# Patient Record
Sex: Male | Born: 1965
Health system: Southern US, Community
[De-identification: ages and names within clinical notes are randomized; demographics above are authoritative.]

## PROBLEM LIST (undated history)

## (undated) DIAGNOSIS — N400 Enlarged prostate without lower urinary tract symptoms: Secondary | ICD-10-CM

## (undated) DIAGNOSIS — N189 Chronic kidney disease, unspecified: Secondary | ICD-10-CM

## (undated) DIAGNOSIS — Z87448 Personal history of other diseases of urinary system: Secondary | ICD-10-CM

## (undated) DIAGNOSIS — Z940 Kidney transplant status: Secondary | ICD-10-CM

## (undated) DIAGNOSIS — I872 Venous insufficiency (chronic) (peripheral): Secondary | ICD-10-CM

## (undated) DIAGNOSIS — I429 Cardiomyopathy, unspecified: Secondary | ICD-10-CM

## (undated) DIAGNOSIS — I272 Pulmonary hypertension, unspecified: Secondary | ICD-10-CM

## (undated) DIAGNOSIS — R600 Localized edema: Secondary | ICD-10-CM

## (undated) DIAGNOSIS — M25561 Pain in right knee: Secondary | ICD-10-CM

## (undated) DIAGNOSIS — R55 Syncope and collapse: Secondary | ICD-10-CM

## (undated) DIAGNOSIS — I340 Nonrheumatic mitral (valve) insufficiency: Secondary | ICD-10-CM

## (undated) DIAGNOSIS — J189 Pneumonia, unspecified organism: Secondary | ICD-10-CM

## (undated) DIAGNOSIS — I10 Essential (primary) hypertension: Secondary | ICD-10-CM

## (undated) DIAGNOSIS — Z87442 Personal history of urinary calculi: Secondary | ICD-10-CM

## (undated) DIAGNOSIS — Z8669 Personal history of other diseases of the nervous system and sense organs: Secondary | ICD-10-CM

## (undated) DIAGNOSIS — N186 End stage renal disease: Secondary | ICD-10-CM

## (undated) DIAGNOSIS — G4730 Sleep apnea, unspecified: Secondary | ICD-10-CM

## (undated) DIAGNOSIS — I509 Heart failure, unspecified: Secondary | ICD-10-CM

## (undated) DIAGNOSIS — J45909 Unspecified asthma, uncomplicated: Secondary | ICD-10-CM

## (undated) DIAGNOSIS — M1A9XX0 Chronic gout, unspecified, without tophus (tophi): Secondary | ICD-10-CM

## (undated) DIAGNOSIS — Z9889 Other specified postprocedural states: Secondary | ICD-10-CM

## (undated) DIAGNOSIS — T7840XA Allergy, unspecified, initial encounter: Secondary | ICD-10-CM

## (undated) DIAGNOSIS — R0600 Dyspnea, unspecified: Secondary | ICD-10-CM

## (undated) DIAGNOSIS — M1990 Unspecified osteoarthritis, unspecified site: Secondary | ICD-10-CM

## (undated) DIAGNOSIS — R609 Edema, unspecified: Secondary | ICD-10-CM

## (undated) DIAGNOSIS — M25562 Pain in left knee: Secondary | ICD-10-CM

## (undated) DIAGNOSIS — M25522 Pain in left elbow: Secondary | ICD-10-CM

## (undated) DIAGNOSIS — N401 Enlarged prostate with lower urinary tract symptoms: Secondary | ICD-10-CM

## (undated) DIAGNOSIS — I428 Other cardiomyopathies: Secondary | ICD-10-CM

## (undated) DIAGNOSIS — M79605 Pain in left leg: Secondary | ICD-10-CM

## (undated) DIAGNOSIS — D638 Anemia in other chronic diseases classified elsewhere: Secondary | ICD-10-CM

## (undated) DIAGNOSIS — D631 Anemia in chronic kidney disease: Secondary | ICD-10-CM

## (undated) DIAGNOSIS — R011 Cardiac murmur, unspecified: Secondary | ICD-10-CM

## (undated) DIAGNOSIS — M25572 Pain in left ankle and joints of left foot: Secondary | ICD-10-CM

## (undated) DIAGNOSIS — F909 Attention-deficit hyperactivity disorder, unspecified type: Secondary | ICD-10-CM

## (undated) DIAGNOSIS — N210 Calculus in bladder: Secondary | ICD-10-CM

## (undated) DIAGNOSIS — Z8673 Personal history of transient ischemic attack (TIA), and cerebral infarction without residual deficits: Secondary | ICD-10-CM

## (undated) DIAGNOSIS — J302 Other seasonal allergic rhinitis: Secondary | ICD-10-CM

## (undated) DIAGNOSIS — M79604 Pain in right leg: Secondary | ICD-10-CM

## (undated) DIAGNOSIS — I5022 Chronic systolic (congestive) heart failure: Secondary | ICD-10-CM

## (undated) HISTORY — DX: Chronic kidney disease, unspecified: N18.9

## (undated) HISTORY — DX: Pain in left ankle and joints of left foot: M25.572

## (undated) HISTORY — DX: Pulmonary hypertension, unspecified: I27.20

## (undated) HISTORY — DX: Chronic systolic (congestive) heart failure: I50.22

## (undated) HISTORY — DX: Allergy, unspecified, initial encounter: T78.40XA

## (undated) HISTORY — DX: Edema, unspecified: R60.9

## (undated) HISTORY — PX: PERITONEAL CATHETER INSERTION: SHX2223

## (undated) HISTORY — DX: Venous insufficiency (chronic) (peripheral): I87.2

## (undated) HISTORY — PX: PERITONEAL CATHETER REMOVAL: SUR1106

## (undated) HISTORY — PX: HERNIA REPAIR: SHX51

## (undated) HISTORY — DX: Essential (primary) hypertension: I10

## (undated) HISTORY — DX: Pain in right leg: M79.604

## (undated) HISTORY — DX: Nonrheumatic mitral (valve) insufficiency: I34.0

## (undated) HISTORY — DX: Pain in left leg: M79.605

## (undated) HISTORY — DX: Anemia in other chronic diseases classified elsewhere: D63.8

## (undated) HISTORY — DX: Unspecified osteoarthritis, unspecified site: M19.90

## (undated) HISTORY — DX: Localized edema: R60.0

## (undated) HISTORY — DX: Cardiomyopathy, unspecified: I42.9

## (undated) HISTORY — PX: UMBILICAL HERNIA REPAIR: SHX196

## (undated) HISTORY — DX: Unspecified asthma, uncomplicated: J45.909

## (undated) HISTORY — DX: Pain in right knee: M25.561

## (undated) HISTORY — PX: LAPAROSCOPIC INGUINAL HERNIA REPAIR: SUR788

## (undated) HISTORY — DX: Pain in left knee: M25.562

## (undated) HISTORY — DX: Pain in left elbow: M25.522

---

## 2008-05-02 HISTORY — PX: MENISCUS REPAIR: SHX5179

## 2008-05-02 HISTORY — PX: KNEE ARTHROSCOPY W/ MENISCAL REPAIR: SHX1877

## 2010-06-07 ENCOUNTER — Other Ambulatory Visit: Payer: Self-pay | Admitting: Family Medicine

## 2010-06-07 DIAGNOSIS — M25561 Pain in right knee: Secondary | ICD-10-CM

## 2010-06-08 ENCOUNTER — Ambulatory Visit
Admission: RE | Admit: 2010-06-08 | Discharge: 2010-06-08 | Disposition: A | Discharge status: Home / Self Care | Payer: 59 | Source: Ambulatory Visit | Attending: Family Medicine | Admitting: Family Medicine

## 2010-06-08 DIAGNOSIS — M25561 Pain in right knee: Secondary | ICD-10-CM

## 2010-07-17 ENCOUNTER — Emergency Department (HOSPITAL_COMMUNITY)
Admission: EM | Admit: 2010-07-17 | Discharge: 2010-07-17 | Disposition: A | Discharge status: Home / Self Care | Payer: 59 | Attending: Emergency Medicine | Admitting: Emergency Medicine

## 2010-07-17 DIAGNOSIS — K089 Disorder of teeth and supporting structures, unspecified: Secondary | ICD-10-CM | POA: Insufficient documentation

## 2010-07-17 DIAGNOSIS — I10 Essential (primary) hypertension: Secondary | ICD-10-CM | POA: Insufficient documentation

## 2010-07-17 DIAGNOSIS — J45909 Unspecified asthma, uncomplicated: Secondary | ICD-10-CM | POA: Insufficient documentation

## 2010-07-17 DIAGNOSIS — K029 Dental caries, unspecified: Secondary | ICD-10-CM | POA: Insufficient documentation

## 2010-11-04 DIAGNOSIS — M25572 Pain in left ankle and joints of left foot: Secondary | ICD-10-CM

## 2010-11-04 HISTORY — DX: Pain in left ankle and joints of left foot: M25.572

## 2011-09-13 ENCOUNTER — Other Ambulatory Visit: Payer: Self-pay

## 2011-09-13 DIAGNOSIS — R609 Edema, unspecified: Secondary | ICD-10-CM

## 2011-10-02 ENCOUNTER — Encounter: Payer: Self-pay | Admitting: Surgery

## 2011-10-06 ENCOUNTER — Encounter: Payer: Self-pay | Admitting: Surgery

## 2011-10-09 ENCOUNTER — Encounter: Payer: 59 | Admitting: Surgery

## 2011-11-03 ENCOUNTER — Encounter: Payer: Self-pay | Admitting: Surgery

## 2011-11-06 ENCOUNTER — Encounter: Payer: 59 | Admitting: Surgery

## 2011-11-14 ENCOUNTER — Encounter: Payer: Self-pay | Admitting: Vascular Surgery

## 2011-11-15 ENCOUNTER — Encounter: Payer: Self-pay | Admitting: Vascular Surgery

## 2011-11-15 ENCOUNTER — Encounter (INDEPENDENT_AMBULATORY_CARE_PROVIDER_SITE_OTHER): Payer: 59 | Admitting: *Deleted

## 2011-11-15 ENCOUNTER — Ambulatory Visit (INDEPENDENT_AMBULATORY_CARE_PROVIDER_SITE_OTHER): Payer: 59 | Admitting: Vascular Surgery

## 2011-11-15 VITALS — BP 155/102 | HR 68 | Resp 16 | Ht 69.0 in | Wt 357.0 lb

## 2011-11-15 DIAGNOSIS — R609 Edema, unspecified: Secondary | ICD-10-CM

## 2011-11-15 NOTE — Progress Notes (Signed)
[facility] [place]  Patient name: Vernon Blackburn MRN: [id number] DOB: Oct 24, 1965 Sex: male  REASON FOR CONSULT: bilateral lower extremity swelling. Referred by Dr. Eunice Blase  HPI: Vernon Blackburn is a 46 y.o. male states that he noted the gradual onset of bilateral lower extremity swelling approximately 8 months ago. His swelling is always been worse on the right side. He denies any previous history of DVT or phlebitis. He has not had surgery on his abdomen her groins nor has he had radiation therapy. He had been taking Axiron 4 testosterone replacement but states that this made his leg swelling worse and therefore he stopped taking this. His symptoms are aggravated by standing and relieved with rest. He does experience some aching pain in his legs with prolonged standing. He has been wearing compression stockings which he wears most of the day. These are knee-high stockings which are medical grade. Of note he is scheduled for gastric banding in December of this year.  Past Medical History  Diagnosis Date  . Peripheral edema     Bilateral lower extremity   . Venous insufficiency   . Knee pain, bilateral   . Leg pain, bilateral   . Arthritis     Gout  . Elbow pain, left   . Left ankle pain 11/04/2010    Gout  . Asthma     Family History  Problem Relation Age of Onset  . Diabetes Mother   . Hypertension Mother     SOCIAL HISTORY: History  Substance Use Topics  . Smoking status: Never Smoker   . Smokeless tobacco: Never Used  . Alcohol Use: No    No Known Allergies  Current Outpatient Prescriptions  Medication Sig Dispense Refill  . allopurinol (ZYLOPRIM) 100 MG tablet Take 100 mg by mouth daily.      Marland Kitchen amlodipine-olmesartan (AZOR) 10-20 MG per tablet Take 1 tablet by mouth daily.      Marland Kitchen atenolol (TENORMIN) 100 MG tablet Take 100 mg by mouth daily.      . colchicine 0.6 MG tablet Take 0.6 mg by mouth daily.      . Febuxostat (ULORIC) 80 MG TABS Take  1 tablet by mouth daily.      . fluticasone-salmeterol (ADVAIR HFA) 115-21 MCG/ACT inhaler Inhale 2 puffs into the lungs 2 (two) times daily.      . furosemide (LASIX) 20 MG tablet Take 80 mg by mouth daily.       . hydrochlorothiazide (HYDRODIURIL) 25 MG tablet Take 25 mg by mouth daily.      . Hydrocodone-Acetaminophen 10-300 MG TABS Take 1 tablet by mouth every 6 (six) hours as needed.      Marland Kitchen KLOR-CON M20 20 MEQ tablet Take 1 tablet by mouth 2 (two) times daily.      . Testosterone (AXIRON TD) Place onto the skin. 2 swipes under each arm pit daily        REVIEW OF SYSTEMS: Valu.Nieves ] denotes positive finding; [  ] denotes negative finding  CARDIOVASCULAR:  [ ]  chest pain   [ ]  chest pressure   [ ]  palpitations   [ ]  orthopnea   [ ]  dyspnea on exertion   [ ]  claudication   [ ]  rest pain   [ ]  DVT   [ ]  phlebitis PULMONARY:   [ ]  productive cough   Valu.Nieves ] asthma   Valu.Nieves ] wheezing NEUROLOGIC:   [ ]  weakness  [ ]  paresthesias  [ ]   aphasia  [ ]  amaurosis  [ ]  dizziness HEMATOLOGIC:   [ ]  bleeding problems   [ ]  clotting disorders MUSCULOSKELETAL:  [ ]  joint pain   [ ]  joint swelling Valu.Nieves ] leg swelling GASTROINTESTINAL: [ ]   blood in stool  [ ]   hematemesis GENITOURINARY:  [ ]   dysuria  [ ]   hematuria PSYCHIATRIC:  [ ]  history of major depression INTEGUMENTARY:  [ ]  rashes  [ ]  ulcers CONSTITUTIONAL:  [ ]  fever   [ ]  chills  PHYSICAL EXAM: Filed Vitals:   11/15/11 1515  BP: 155/102  Pulse: 68  Resp: 16  Height: 5\' 9"  (1.753 m)  Weight: 357 lb (161.934 kg)  SpO2: 98%   Body mass index is 52.72 kg/(m^2). GENERAL: The patient is a well-nourished male, in no acute distress. The vital signs are documented above. CARDIOVASCULAR: There is a regular rate and rhythm. I do not detect carotid bruits. He has palpable popliteal and pedal pulses bilaterally. He has bilateral lower extremity swelling. This is more significant on the right side. His edema is pitting. PULMONARY: There is good air exchange  bilaterally without wheezing or rales. ABDOMEN: Soft and non-tender with normal pitched bowel sounds.  MUSCULOSKELETAL: There are no major deformities or cyanosis. NEUROLOGIC: No focal weakness or paresthesias are detected. SKIN: There are no ulcers or rashes noted. PSYCHIATRIC: The patient has a normal affect.  DATA:  I have independently interpreted his venous duplex scan. On the left side he has some reflux in the saphenofemoral junction and proximal greater saphenous vein over a short distance. There is no evidence of DVT. On the right side he has some deep vein reflux in the femoral vein and also some in the saphenofemoral junction. There is no DVT in either lower extremity.  I have reviewed his records from Dr. Park Liter office. He has been treated with diuretics and also compression therapy.  MEDICAL ISSUES: I would agree with Dr.Hilts that this patient has chronic venous insufficiency bilaterally. He may also have some lymphedema however I've explained to the patient that the treatment would be the same. We have discussed the importance of intermittent leg elevation and the proper positioning for this. In addition we have discussed the importance of wearing his compression stockings which needed replaced every 6 months. I've encouraged him to avoid prolonged standing and sitting to remain as active as possible. I be happy to see him back at any time if any new vascular issues arise.   [facility] [place] Beeper: [phone]

## 2012-01-03 HISTORY — PX: GASTRIC BYPASS: SHX52

## 2014-07-20 ENCOUNTER — Other Ambulatory Visit: Payer: Self-pay | Admitting: Nephrology

## 2014-07-20 DIAGNOSIS — N183 Chronic kidney disease, stage 3 unspecified: Secondary | ICD-10-CM

## 2014-07-24 ENCOUNTER — Other Ambulatory Visit: Payer: Self-pay

## 2014-08-17 ENCOUNTER — Ambulatory Visit
Admission: RE | Admit: 2014-08-17 | Discharge: 2014-08-17 | Disposition: A | Discharge status: Home / Self Care | Payer: BLUE CROSS/BLUE SHIELD | Source: Ambulatory Visit | Attending: Nephrology | Admitting: Nephrology

## 2014-08-17 DIAGNOSIS — N183 Chronic kidney disease, stage 3 unspecified: Secondary | ICD-10-CM

## 2015-04-02 ENCOUNTER — Other Ambulatory Visit: Payer: Self-pay | Admitting: Nephrology

## 2015-04-02 DIAGNOSIS — N183 Chronic kidney disease, stage 3 (moderate): Secondary | ICD-10-CM

## 2015-04-07 ENCOUNTER — Other Ambulatory Visit: Payer: BLUE CROSS/BLUE SHIELD

## 2015-06-17 ENCOUNTER — Other Ambulatory Visit: Payer: BLUE CROSS/BLUE SHIELD

## 2015-07-27 ENCOUNTER — Ambulatory Visit (INDEPENDENT_AMBULATORY_CARE_PROVIDER_SITE_OTHER): Payer: BLUE CROSS/BLUE SHIELD | Admitting: Psychology

## 2015-07-27 DIAGNOSIS — F4323 Adjustment disorder with mixed anxiety and depressed mood: Secondary | ICD-10-CM | POA: Diagnosis not present

## 2015-08-10 ENCOUNTER — Ambulatory Visit (INDEPENDENT_AMBULATORY_CARE_PROVIDER_SITE_OTHER): Payer: BLUE CROSS/BLUE SHIELD | Admitting: Psychology

## 2015-08-10 DIAGNOSIS — F4323 Adjustment disorder with mixed anxiety and depressed mood: Secondary | ICD-10-CM | POA: Diagnosis not present

## 2015-08-18 ENCOUNTER — Ambulatory Visit: Payer: BLUE CROSS/BLUE SHIELD | Admitting: Psychology

## 2016-01-02 ENCOUNTER — Encounter (HOSPITAL_COMMUNITY): Payer: Self-pay

## 2016-01-02 ENCOUNTER — Emergency Department (HOSPITAL_COMMUNITY): Payer: Medicaid Other

## 2016-01-02 ENCOUNTER — Inpatient Hospital Stay (HOSPITAL_COMMUNITY)
Admission: EM | Admit: 2016-01-02 | Discharge: 2016-01-06 | DRG: 291 | Disposition: A | Discharge status: Home / Self Care | Payer: Medicaid Other | Attending: Internal Medicine | Admitting: Internal Medicine

## 2016-01-02 DIAGNOSIS — I10 Essential (primary) hypertension: Secondary | ICD-10-CM

## 2016-01-02 DIAGNOSIS — J45901 Unspecified asthma with (acute) exacerbation: Secondary | ICD-10-CM | POA: Diagnosis present

## 2016-01-02 DIAGNOSIS — I429 Cardiomyopathy, unspecified: Secondary | ICD-10-CM

## 2016-01-02 DIAGNOSIS — R609 Edema, unspecified: Secondary | ICD-10-CM

## 2016-01-02 DIAGNOSIS — I509 Heart failure, unspecified: Secondary | ICD-10-CM

## 2016-01-02 DIAGNOSIS — R0600 Dyspnea, unspecified: Secondary | ICD-10-CM

## 2016-01-02 DIAGNOSIS — Z9114 Patient's other noncompliance with medication regimen: Secondary | ICD-10-CM

## 2016-01-02 DIAGNOSIS — K761 Chronic passive congestion of liver: Secondary | ICD-10-CM | POA: Diagnosis present

## 2016-01-02 DIAGNOSIS — N179 Acute kidney failure, unspecified: Secondary | ICD-10-CM | POA: Diagnosis present

## 2016-01-02 DIAGNOSIS — N189 Chronic kidney disease, unspecified: Secondary | ICD-10-CM

## 2016-01-02 DIAGNOSIS — I5021 Acute systolic (congestive) heart failure: Secondary | ICD-10-CM | POA: Insufficient documentation

## 2016-01-02 DIAGNOSIS — D649 Anemia, unspecified: Secondary | ICD-10-CM

## 2016-01-02 DIAGNOSIS — D638 Anemia in other chronic diseases classified elsewhere: Secondary | ICD-10-CM

## 2016-01-02 DIAGNOSIS — I130 Hypertensive heart and chronic kidney disease with heart failure and stage 1 through stage 4 chronic kidney disease, or unspecified chronic kidney disease: Secondary | ICD-10-CM | POA: Diagnosis present

## 2016-01-02 DIAGNOSIS — I272 Pulmonary hypertension, unspecified: Secondary | ICD-10-CM | POA: Diagnosis present

## 2016-01-02 DIAGNOSIS — Z833 Family history of diabetes mellitus: Secondary | ICD-10-CM

## 2016-01-02 DIAGNOSIS — Z8249 Family history of ischemic heart disease and other diseases of the circulatory system: Secondary | ICD-10-CM

## 2016-01-02 DIAGNOSIS — Z9884 Bariatric surgery status: Secondary | ICD-10-CM | POA: Diagnosis not present

## 2016-01-02 DIAGNOSIS — I340 Nonrheumatic mitral (valve) insufficiency: Secondary | ICD-10-CM | POA: Diagnosis present

## 2016-01-02 DIAGNOSIS — R0609 Other forms of dyspnea: Secondary | ICD-10-CM

## 2016-01-02 DIAGNOSIS — R0602 Shortness of breath: Secondary | ICD-10-CM | POA: Diagnosis present

## 2016-01-02 DIAGNOSIS — N184 Chronic kidney disease, stage 4 (severe): Secondary | ICD-10-CM

## 2016-01-02 HISTORY — DX: Heart failure, unspecified: I50.9

## 2016-01-02 LAB — CREATININE, URINE, RANDOM: Creatinine, Urine: 97.86 mg/dL

## 2016-01-02 LAB — TYPE AND SCREEN
ABO/RH(D): O POS
Antibody Screen: NEGATIVE

## 2016-01-02 LAB — CBC
HCT: 31.2 % — ABNORMAL LOW (ref 39.0–52.0)
Hemoglobin: 10.2 g/dL — ABNORMAL LOW (ref 13.0–17.0)
MCH: 23.6 pg — ABNORMAL LOW (ref 26.0–34.0)
MCHC: 32.7 g/dL (ref 30.0–36.0)
MCV: 72.2 fL — ABNORMAL LOW (ref 78.0–100.0)
Platelets: 407 10*3/uL — ABNORMAL HIGH (ref 150–400)
RBC: 4.32 MIL/uL (ref 4.22–5.81)
RDW: 16.6 % — ABNORMAL HIGH (ref 11.5–15.5)
WBC: 10.3 10*3/uL (ref 4.0–10.5)

## 2016-01-02 LAB — PROTIME-INR
INR: 1.34
Prothrombin Time: 16.7 seconds — ABNORMAL HIGH (ref 11.4–15.2)

## 2016-01-02 LAB — URINALYSIS, ROUTINE W REFLEX MICROSCOPIC
Bacteria, UA: NONE SEEN
Bilirubin Urine: NEGATIVE
Glucose, UA: NEGATIVE mg/dL
Hgb urine dipstick: NEGATIVE
Ketones, ur: NEGATIVE mg/dL
Leukocytes, UA: NEGATIVE
Nitrite: NEGATIVE
Protein, ur: 30 mg/dL — AB
Specific Gravity, Urine: 1.015 (ref 1.005–1.030)
Squamous Epithelial / LPF: NONE SEEN
pH: 5 (ref 5.0–8.0)

## 2016-01-02 LAB — OSMOLALITY, URINE: Osmolality, Ur: 507 mOsm/kg (ref 300–900)

## 2016-01-02 LAB — COMPREHENSIVE METABOLIC PANEL
ALT: 91 U/L — ABNORMAL HIGH (ref 17–63)
AST: 60 U/L — ABNORMAL HIGH (ref 15–41)
Albumin: 3.2 g/dL — ABNORMAL LOW (ref 3.5–5.0)
Alkaline Phosphatase: 57 U/L (ref 38–126)
Anion gap: 9 (ref 5–15)
BUN: 70 mg/dL — ABNORMAL HIGH (ref 6–20)
CO2: 22 mmol/L (ref 22–32)
Calcium: 8.3 mg/dL — ABNORMAL LOW (ref 8.9–10.3)
Chloride: 107 mmol/L (ref 101–111)
Creatinine, Ser: 3.51 mg/dL — ABNORMAL HIGH (ref 0.61–1.24)
GFR calc Af Amer: 22 mL/min — ABNORMAL LOW (ref >60)
GFR calc non Af Amer: 19 mL/min — ABNORMAL LOW (ref >60)
Glucose, Bld: 114 mg/dL — ABNORMAL HIGH (ref 65–99)
Potassium: 4.2 mmol/L (ref 3.5–5.1)
Sodium: 138 mmol/L (ref 135–145)
Total Bilirubin: 0.9 mg/dL (ref 0.3–1.2)
Total Protein: 5 g/dL — ABNORMAL LOW (ref 6.5–8.1)

## 2016-01-02 LAB — I-STAT TROPONIN, ED: Troponin i, poc: 0.03 ng/mL (ref 0.00–0.08)

## 2016-01-02 LAB — BRAIN NATRIURETIC PEPTIDE: B Natriuretic Peptide: 1225.2 pg/mL — ABNORMAL HIGH (ref 0.0–100.0)

## 2016-01-02 LAB — SODIUM, URINE, RANDOM: Sodium, Ur: 59 mmol/L

## 2016-01-02 LAB — ABO/RH: ABO/RH(D): O POS

## 2016-01-02 MED ORDER — ALBUTEROL SULFATE (2.5 MG/3ML) 0.083% IN NEBU: 3.0000 mL | INHALATION_SOLUTION | Freq: Four times a day (QID) | RESPIRATORY_TRACT | Status: DC | PRN

## 2016-01-02 MED ORDER — ONDANSETRON HCL 4 MG/2ML IJ SOLN: 4.0000 mg | Freq: Four times a day (QID) | INTRAMUSCULAR | Status: DC | PRN

## 2016-01-02 MED ORDER — ALBUTEROL SULFATE (5 MG/ML) 0.5% IN NEBU: 2.5000 mg | INHALATION_SOLUTION | Freq: Four times a day (QID) | RESPIRATORY_TRACT | Status: DC | PRN

## 2016-01-02 MED ORDER — CLONIDINE HCL 0.2 MG PO TABS
0.3000 mg | ORAL_TABLET | Freq: Every day | ORAL | Status: DC
  Administered 2016-01-02: 0.3 mg via ORAL
  Filled 2016-01-02: qty 1

## 2016-01-02 MED ORDER — SODIUM CHLORIDE 0.9% FLUSH
3.0000 mL | Freq: Two times a day (BID) | INTRAVENOUS | Status: DC
  Administered 2016-01-02 – 2016-01-05 (×6): 3 mL via INTRAVENOUS

## 2016-01-02 MED ORDER — FUROSEMIDE 10 MG/ML IJ SOLN
40.0000 mg | Freq: Two times a day (BID) | INTRAMUSCULAR | Status: DC
  Administered 2016-01-02 – 2016-01-04 (×4): 40 mg via INTRAVENOUS
  Filled 2016-01-02 (×4): qty 4

## 2016-01-02 MED ORDER — LABETALOL HCL 100 MG PO TABS
100.0000 mg | ORAL_TABLET | Freq: Three times a day (TID) | ORAL | Status: DC
  Administered 2016-01-02 – 2016-01-03 (×4): 100 mg via ORAL
  Filled 2016-01-02 (×4): qty 1

## 2016-01-02 MED ORDER — SODIUM CHLORIDE 0.9% FLUSH: 3.0000 mL | INTRAVENOUS | Status: DC | PRN

## 2016-01-02 MED ORDER — SODIUM CHLORIDE 0.9 % IV SOLN: 250.0000 mL | INTRAVENOUS | Status: DC | PRN

## 2016-01-02 MED ORDER — PNEUMOCOCCAL VAC POLYVALENT 25 MCG/0.5ML IJ INJ
0.5000 mL | INJECTION | INTRAMUSCULAR | Status: AC
  Administered 2016-01-03: 0.5 mL via INTRAMUSCULAR
  Filled 2016-01-02 (×2): qty 0.5

## 2016-01-02 MED ORDER — HYDRALAZINE HCL 20 MG/ML IJ SOLN
10.0000 mg | Freq: Four times a day (QID) | INTRAMUSCULAR | Status: DC | PRN
  Administered 2016-01-02: 10 mg via INTRAVENOUS
  Filled 2016-01-02: qty 1

## 2016-01-02 MED ORDER — INFLUENZA VAC SPLIT QUAD 0.5 ML IM SUSY
0.5000 mL | PREFILLED_SYRINGE | INTRAMUSCULAR | Status: AC
Start: 2016-01-03 — End: 2016-01-03
  Administered 2016-01-03: 0.5 mL via INTRAMUSCULAR
  Filled 2016-01-02: qty 0.5

## 2016-01-02 MED ORDER — MOMETASONE FURO-FORMOTEROL FUM 200-5 MCG/ACT IN AERO
2.0000 | INHALATION_SPRAY | Freq: Two times a day (BID) | RESPIRATORY_TRACT | Status: DC
  Administered 2016-01-03 – 2016-01-05 (×4): 2 via RESPIRATORY_TRACT
  Filled 2016-01-02: qty 8.8

## 2016-01-02 MED ORDER — HEPARIN SODIUM (PORCINE) 5000 UNIT/ML IJ SOLN
5000.0000 [IU] | Freq: Three times a day (TID) | INTRAMUSCULAR | Status: DC
  Administered 2016-01-02 – 2016-01-06 (×11): 5000 [IU] via SUBCUTANEOUS
  Filled 2016-01-02 (×11): qty 1

## 2016-01-02 MED ORDER — ACETAMINOPHEN 325 MG PO TABS
650.0000 mg | ORAL_TABLET | ORAL | Status: DC | PRN
  Administered 2016-01-05: 650 mg via ORAL
  Filled 2016-01-02: qty 2

## 2016-01-02 NOTE — ED Triage Notes (Signed)
PT C/O GENERALIZED ABDOMINAL PAIN WITH DARK DIARRHEA SINCE Monday. PT STS HE WAS SEEN BY HIS PCP ON Friday, BUT HIS STOMACH PAIN WAS NOT ADDRESSED BY THE PHYSICIAN. PT STS HIS ABDOMEN FEELS HARD AND THE STOOLS ARE DARK AND "JELLY-LIKE". DENIES N/V/OR FEVER. LAST DARK STOOL WAS THIS AM.

## 2016-01-02 NOTE — ED Provider Notes (Signed)
[facility] DEPT Provider Note   CSN: [id number] Arrival date & time: 01/02/16  1152     History   Chief Complaint Chief Complaint  Patient presents with  . Abdominal Pain    HPI Vernon Blackburn is a 50 y.o. male.  Patient is a 50 year old male status post gastric bypass with a history of chronic kidney disease, hypertension and asthma presenting today with one-week history of diffuse swelling, 20 pound weight gain, cough, shortness of breath and intermittent abdominal tightness. Patient states it started suddenly one week ago and has been progressive. May be slight improvement in swelling in his legs over the last 2 days that he is not feeling any better. He saw his doctor on Friday and at that time was diagnosed with exacerbation of his asthma and started on an antibiotic, prednisone and an inhaler. However this has not improved his symptoms. Patient does see Dr. [name] with [facility] but is not sure what his last creatinine was. He currently is taking 3 blood pressure medications hydrochlorothiazide, labetalol and one other medication that he is getting samples of. He denies alcohol use, NSAID use and does not know if he is on an ACE inhibitor. He feels like he has not been urinating as much as normal but he denies any burning with urination. He feels that he is not completely emptying his bladder. He is also noticed some dark stool with intermittent blood. It is not painful to have a bowel movement and no nausea or vomiting   The history is provided by the patient.  Abdominal Pain      Past Medical History:  Diagnosis Date  . Arthritis    Gout  . Asthma   . Elbow pain, left   . Knee pain, bilateral   . Left ankle pain 11/04/2010   Gout  . Leg pain, bilateral   . Peripheral edema    Bilateral lower extremity   . Venous insufficiency     Patient Active Problem List   Diagnosis Date Noted  . Edema 11/15/2011    Past Surgical History:  Procedure Laterality Date   . MENISCUS REPAIR  05/2008   right        Home Medications    Prior to Admission medications   Medication Sig Start Date End Date Taking? Authorizing Provider  allopurinol (ZYLOPRIM) 100 MG tablet Take 100 mg by mouth daily.    Historical Provider, MD  amlodipine-olmesartan (AZOR) 10-20 MG per tablet Take 1 tablet by mouth daily.    Historical Provider, MD  atenolol (TENORMIN) 100 MG tablet Take 100 mg by mouth daily.    Historical Provider, MD  colchicine 0.6 MG tablet Take 0.6 mg by mouth daily.    Historical Provider, MD  Febuxostat (ULORIC) 80 MG TABS Take 1 tablet by mouth daily.    Historical Provider, MD  fluticasone-salmeterol (ADVAIR HFA) 115-21 MCG/ACT inhaler Inhale 2 puffs into the lungs 2 (two) times daily.    Historical Provider, MD  furosemide (LASIX) 20 MG tablet Take 80 mg by mouth daily.     Historical Provider, MD  hydrochlorothiazide (HYDRODIURIL) 25 MG tablet Take 25 mg by mouth daily. 10/14/11   Historical Provider, MD  Hydrocodone-Acetaminophen 10-300 MG TABS Take 1 tablet by mouth every 6 (six) hours as needed.    Historical Provider, MD  KLOR-CON M20 20 MEQ tablet Take 1 tablet by mouth 2 (two) times daily. 10/14/11   Historical Provider, MD  Testosterone Hinda Kehr TD) Place onto the skin.  2 swipes under each arm pit daily    Historical Provider, MD    Family History Family History  Problem Relation Age of Onset  . Diabetes Mother   . Hypertension Mother     Social History Social History  Substance Use Topics  . Smoking status: Never Smoker  . Smokeless tobacco: Never Used  . Alcohol use No     Allergies   Patient has no known allergies.   Review of Systems Review of Systems  Gastrointestinal: Positive for abdominal pain.  All other systems reviewed and are negative.    Physical Exam Updated Vital Signs BP (!) 149/118   Pulse 78   Temp 98.4 F (36.9 C) (Oral)   Resp 18   Ht 5\' 9"  (1.753 m)   Wt 240 lb (108.9 kg)   SpO2 100%   BMI  35.44 kg/m   Physical Exam  Constitutional: He is oriented to person, place, and time. He appears well-developed and well-nourished. No distress.  HENT:  Head: Normocephalic and atraumatic.  Mouth/Throat: Oropharynx is clear and moist.  Eyes: Conjunctivae and EOM are normal. Pupils are equal, round, and reactive to light.  Neck: Normal range of motion. Neck supple.  Cardiovascular: Normal rate, regular rhythm and intact distal pulses.   No murmur heard. Pulmonary/Chest: Effort normal. No respiratory distress. He has no wheezes. He has rales.  Trace rales in the bases  Abdominal: Soft. He exhibits distension. There is tenderness in the suprapubic area. There is no rebound and no guarding.  Mild suprapubic pain  Musculoskeletal: Normal range of motion. He exhibits edema. He exhibits no tenderness.  2+ edema to the mid shin  Neurological: He is alert and oriented to person, place, and time.  Skin: Skin is warm and dry. No rash noted. No erythema.  Psychiatric: He has a normal mood and affect. His behavior is normal.  Nursing note and vitals reviewed.    ED Treatments / Results  Labs (all labs ordered are listed, but only abnormal results are displayed) Labs Reviewed  COMPREHENSIVE METABOLIC PANEL - Abnormal; Notable for the following:       Result Value   Glucose, Bld 114 (*)    BUN 70 (*)    Creatinine, Ser 3.51 (*)    Calcium 8.3 (*)    Total Protein 5.0 (*)    Albumin 3.2 (*)    AST 60 (*)    ALT 91 (*)    GFR calc non Af Amer 19 (*)    GFR calc Af Amer 22 (*)    All other components within normal limits  CBC - Abnormal; Notable for the following:    Hemoglobin 10.2 (*)    HCT 31.2 (*)    MCV 72.2 (*)    MCH 23.6 (*)    RDW 16.6 (*)    Platelets 407 (*)    All other components within normal limits  PROTIME-INR - Abnormal; Notable for the following:    Prothrombin Time 16.7 (*)    All other components within normal limits  BRAIN NATRIURETIC PEPTIDE - Abnormal;  Notable for the following:    B Natriuretic Peptide 1,225.2 (*)    All other components within normal limits  URINALYSIS, ROUTINE W REFLEX MICROSCOPIC - Abnormal; Notable for the following:    Protein, ur 30 (*)    All other components within normal limits  I-STAT TROPOININ, ED  POC OCCULT BLOOD, ED  TYPE AND SCREEN  ABO/RH    EKG  EKG  Interpretation  Date/Time:  Sunday January 02 2016 12:25:33 EST Ventricular Rate:  80 PR Interval:    QRS Duration: 92 QT Interval:  467 QTC Calculation: 539 R Axis:   5 Text Interpretation:  Sinus rhythm Ventricular premature complex Low voltage, precordial leads Borderline T abnormalities, diffuse leads Prolonged QT interval Baseline wander in lead(s) V2 No previous tracing Confirmed by Maryan Rued  MD, Samit Sylve ([id number]) on 01/02/2016 1:39:05 PM       Radiology Dg Chest 2 View  Result Date: 01/02/2016 CLINICAL DATA:  Mid chest pain EXAM: CHEST  2 VIEW COMPARISON:  12/14/2012 FINDINGS: Cardiomegaly. No focal airspace opacities or edema. No acute bony abnormality. IMPRESSION: Cardiomegaly.  No active disease. Electronically Signed   By: Rolm Baptise M.D.   On: 01/02/2016 12:37    Procedures Procedures (including critical care time)  Medications Ordered in ED Medications  hydrALAZINE (APRESOLINE) injection 10 mg (10 mg Intravenous Given 01/02/16 1741)     Initial Impression / Assessment and Plan / ED Course  I have reviewed the triage vital signs and the nursing notes.  Pertinent labs & imaging results that were available during my care of the patient were reviewed by me and considered in my medical decision making (see chart for details).  Clinical Course    Patient presenting today with multiple complaints of edema, stomach tightness, shortness of breath, and intermittent bloody stools. He saw his PCP 2 days ago and at that time was diagnosed with asthma exacerbation and given antibiotics, steroids and inhaler. However here today patient  appears to be fluid overloaded. he is status post gastric bypass approximately 10 years ago and has lost almost 200 pounds. He used to be on Lasix but has not taken it for years. He denies any known heart issues but does have known chronic kidney disease. He is currently taking 3 blood pressure medications but unsure if one of them is in ACE inhibitor. He denies any NSAID use or other medications that would be renally toxic. Patient today has elevated creatinine of 3.5 unclear his baseline, a BNP of over 1000 and cardiomegaly on his chest x-ray. Troponin is negative and EKG without old to compare. Chest x-ray does not show signs of pleural effusion. Patient is hypertensive on exam but satting normally. After urinating a post void residual was checked and it was 0. Since no evidence of retention. UA is negative for infection. Discussed with Dr. Jonnie Finner who is unable to get the records sent over his baseline creatinine is but recommended diuresis. Patient will be admitted to the hospitalist service. Nephrology will consult as needed. He was started on Lasix.  Final Clinical Impressions(s) / ED Diagnoses   Final diagnoses:  Acute renal failure superimposed on chronic kidney disease, unspecified CKD stage, unspecified acute renal failure type ([place])  Acute congestive heart failure, unspecified congestive heart failure type [facility])    New Prescriptions New Prescriptions   No medications on file     Blanchie Dessert, MD 01/02/16 1757

## 2016-01-02 NOTE — ED Notes (Signed)
PT NOW C/O CHEST PAIN AND BILATERAL LEG SWELLING THAT STARTED ON Monday ALONG WITH THE OTHER SYMPTOMS.

## 2016-01-02 NOTE — H&P (Signed)
[facility] History and Physical  SAKARI ALKHATIB [id number] DOB: Dec 27, 1965 DOA: 01/02/2016  PCP: [name], MD  Patient coming from: Home  Chief Complaint: Shortness of breath, leg swelling  HPI: Vernon Blackburn is a 50 y.o. male with a medical history of chronic kidney disease, gastric bypass, asthma, who presented to the emergency department with complaints of shortness of breath and leg swelling. Patient presented to his primary care physician's office on Friday, 12/31/2015. He was given antibiotic, prednisone, albuterol and told he was having an asthma exacerbation. Patient stated he continued to have worsening shortness of breath with exertion as well as leg swelling. He states he has been under stress lately as he is a Education administrator at [employer]. Patient has been noncompliant with his blood pressure medications, due to insurance coverage. Patient currently denies any chest pain, dizziness, headache, recent travel or illness, abdominal pain, nausea or vomiting, diarrhea or constipation.  ED Course: Found to have elevated BNP 1225, creatinine 3.5. Nephrology called recommended diuresis. [facility] called for admission.  Review of Systems:  All other systems reviewed and are negative.   Past Medical History:  Diagnosis Date  . Arthritis    Gout  . Asthma   . Elbow pain, left   . Knee pain, bilateral   . Left ankle pain 11/04/2010   Gout  . Leg pain, bilateral   . Peripheral edema    Bilateral lower extremity   . Venous insufficiency     Past Surgical History:  Procedure Laterality Date  . MENISCUS REPAIR  05/2008   right     Social History:  reports that he has never smoked. He has never used smokeless tobacco. He reports that he does not drink alcohol or use drugs.  No Known Allergies  Family History  Problem Relation Age of Onset  . Diabetes Mother   . Hypertension Mother     Prior to Admission medications   Medication Sig Start  Date End Date Taking? Authorizing Provider  albuterol (PROVENTIL HFA;VENTOLIN HFA) 108 (90 Base) MCG/ACT inhaler Inhale 2 puffs into the lungs every 6 (six) hours as needed for wheezing or shortness of breath.   Yes Historical Provider, MD  albuterol (PROVENTIL) (5 MG/ML) 0.5% nebulizer solution Take 2.5 mg by nebulization every 6 (six) hours as needed for wheezing or shortness of breath.   Yes Historical Provider, MD  cloNIDine (CATAPRES) 0.3 MG tablet Take 0.3 mg by mouth at bedtime.   Yes Historical Provider, MD  Fluticasone-Salmeterol (ADVAIR) 500-50 MCG/DOSE AEPB Inhale 1 puff into the lungs 2 (two) times daily.   Yes Historical Provider, MD  hydrochlorothiazide (HYDRODIURIL) 25 MG tablet Take 25 mg by mouth daily. 10/14/11  Yes Historical Provider, MD  labetalol (NORMODYNE) 100 MG tablet Take 100 mg by mouth 3 (three) times daily.   Yes Historical Provider, MD  levofloxacin (LEVAQUIN) 750 MG tablet Take 750 mg by mouth daily.   Yes Historical Provider, MD  predniSONE (DELTASONE) 10 MG tablet Take 10-40 mg by mouth daily. Take 40mg  by mouth x 2 days, 30mg  by mouth x 2 days, 20mg  by mouth x 2 days, 10mg  by mouth x 2 days   Yes Historical Provider, MD    Physical Exam: Vitals:   01/02/16 1539 01/02/16 1713  BP: (!) 158/116 (!) 168/130  Pulse: 82 93  Resp: 18 18  Temp:       General: Well developed, well nourished, NAD, appears stated age  56: NCAT, PERRLA, EOMI,  Anicteic Sclera, mucous membranes moist.   Neck: Supple, +JVD, no masses  Cardiovascular: S1 S2 auscultated, 1/6SEM. Regular rate and rhythm.  Respiratory: Clear to auscultation bilaterally with equal chest rise  Abdomen: Soft, nontender, nondistended, + bowel sounds  Extremities: warm dry without cyanosis clubbing. +2 LE pitting edema.  Neuro: AAOx3, cranial nerves grossly intact. Strength 5/5 in patient's upper and lower extremities bilaterally  Skin: Without rashes exudates or nodules  Psych: Normal affect and  demeanor with intact judgement and insight  Labs on Admission: I have personally reviewed following labs and imaging studies CBC:  Recent Labs Lab 01/02/16 1240  WBC 10.3  HGB 10.2*  HCT 31.2*  MCV 72.2*  PLT 235*   Basic Metabolic Panel:  Recent Labs Lab 01/02/16 1240  NA 138  K 4.2  CL 107  CO2 22  GLUCOSE 114*  BUN 70*  CREATININE 3.51*  CALCIUM 8.3*   GFR: Estimated Creatinine Clearance: 31 mL/min (by C-G formula based on SCr of 3.51 mg/dL (H)). Liver Function Tests:  Recent Labs Lab 01/02/16 1240  AST 60*  ALT 91*  ALKPHOS 57  BILITOT 0.9  PROT 5.0*  ALBUMIN 3.2*   No results for input(s): LIPASE, AMYLASE in the last 168 hours. No results for input(s): AMMONIA in the last 168 hours. Coagulation Profile:  Recent Labs Lab 01/02/16 1240  INR 1.34   Cardiac Enzymes: No results for input(s): CKTOTAL, CKMB, CKMBINDEX, TROPONINI in the last 168 hours. BNP (last 3 results) No results for input(s): PROBNP in the last 8760 hours. HbA1C: No results for input(s): HGBA1C in the last 72 hours. CBG: No results for input(s): GLUCAP in the last 168 hours. Lipid Profile: No results for input(s): CHOL, HDL, LDLCALC, TRIG, CHOLHDL, LDLDIRECT in the last 72 hours. Thyroid Function Tests: No results for input(s): TSH, T4TOTAL, FREET4, T3FREE, THYROIDAB in the last 72 hours. Anemia Panel: No results for input(s): VITAMINB12, FOLATE, FERRITIN, TIBC, IRON, RETICCTPCT in the last 72 hours. Urine analysis:    Component Value Date/Time   COLORURINE YELLOW 01/02/2016 [place] 01/02/2016 1635   LABSPEC 1.015 01/02/2016 1635   PHURINE 5.0 01/02/2016 1635   GLUCOSEU NEGATIVE 01/02/2016 1635   HGBUR NEGATIVE 01/02/2016 1635   [place] 01/02/2016 1635   [place] 01/02/2016 1635   PROTEINUR 30 (A) 01/02/2016 1635   NITRITE NEGATIVE 01/02/2016 1635   LEUKOCYTESUR NEGATIVE 01/02/2016 1635   Sepsis  Labs: @LABRCNTIP (procalcitonin:4,lacticidven:4) )No results found for this or any previous visit (from the past 240 hour(s)).   Radiological Exams on Admission: Dg Chest 2 View  Result Date: 01/02/2016 CLINICAL DATA:  Mid chest pain EXAM: CHEST  2 VIEW COMPARISON:  12/14/2012 FINDINGS: Cardiomegaly. No focal airspace opacities or edema. No acute bony abnormality. IMPRESSION: Cardiomegaly.  No active disease. Electronically Signed   By: Rolm Baptise M.D.   On: 01/02/2016 12:37    EKG: Independently reviewed. Sinus rhythm, rate 80, PVC  Assessment/Plan Dyspnea upon exertion -Likely secondary to renal failure versus undiagnosed CHF -Patient presents with hypervolemia, lower extremity swelling. Chest x-ray showed cardiomegaly, no pulmonary edema -BNP 1225 (possibly elevated due to chronic kidney disease versus CHF) -Place patient on oxygen to maintain sats above 92% -EDP spoke with nephrology, unknown baseline creatinine. Nephrology suggested diuresis. -Place patient on Lasix IV 40mg  twice a day -Monitor intake and output, daily weights -Obtain echocardiogram  Chronic kidney disease, unknown stage -Last creatinine in March 2017 was 2.5 (found under care everywhere) -Patient does follow with  Dr. [name], however, missed his last appointment -Creatinine upon admission 3.51 -As stated above, nephrology was consulted by EDP. Nephrology will not formally consult. Recommended diuresis -Will continue to monitor CMP  Accelerated hypertension -Patient admits to not being compliant with his medications due to recent insurance changes -Patient has not taken his clonidine in a few days. -Will restart clonidine, continue labetalol -Will avoid ACE inhibitor or ARB due to kidney function -Will place on hydralazine as needed  Asthma -Currently stable, continue albuterol as needed  Chronic normocytic anemia/anemia of chronic disease -Baseline hemoglobin appears to be 11 (per care everywhere) -Upon  admission, hemoglobin 10.2 -Continue to monitor CBC  Mildly elevated LFTs -Continue to monitor CMP  DVT prophylaxis: Heparin  Code Status: Full  Family Communication: None at bedside. Admission, patients condition and plan of care including tests being ordered have been discussed with the patient, who indicates understanding and agrees with the plan and Code Status.  Disposition Plan: Home when stable  Consults called: Nephrology by EDP   Admission status: Inpatient   Time spent: 70 minutes  Maurisa Tesmer D.O. [facility] Pager [phone]  If 7PM-7AM, please contact night-coverage [email or web address] Password [email or web address] 01/02/2016, 5:35 PM

## 2016-01-02 NOTE — Progress Notes (Signed)
Assumed care of patient from ED, RN.

## 2016-01-03 ENCOUNTER — Inpatient Hospital Stay (HOSPITAL_COMMUNITY): Payer: Medicaid Other

## 2016-01-03 DIAGNOSIS — I5021 Acute systolic (congestive) heart failure: Secondary | ICD-10-CM

## 2016-01-03 LAB — BASIC METABOLIC PANEL
Anion gap: 8 (ref 5–15)
BUN: 63 mg/dL — ABNORMAL HIGH (ref 6–20)
CO2: 26 mmol/L (ref 22–32)
Calcium: 8.1 mg/dL — ABNORMAL LOW (ref 8.9–10.3)
Chloride: 106 mmol/L (ref 101–111)
Creatinine, Ser: 3.05 mg/dL — ABNORMAL HIGH (ref 0.61–1.24)
GFR calc Af Amer: 26 mL/min — ABNORMAL LOW (ref >60)
GFR calc non Af Amer: 22 mL/min — ABNORMAL LOW (ref >60)
Glucose, Bld: 90 mg/dL (ref 65–99)
Potassium: 4 mmol/L (ref 3.5–5.1)
Sodium: 140 mmol/L (ref 135–145)

## 2016-01-03 LAB — OSMOLALITY: Osmolality: 314 mOsm/kg — ABNORMAL HIGH (ref 275–295)

## 2016-01-03 LAB — ECHOCARDIOGRAM COMPLETE
Height: 70 in
Weight: 3841.6 oz

## 2016-01-03 MED ORDER — ISOSORBIDE MONONITRATE ER 30 MG PO TB24
30.0000 mg | ORAL_TABLET | Freq: Every day | ORAL | Status: DC
  Administered 2016-01-03 – 2016-01-05 (×3): 30 mg via ORAL
  Filled 2016-01-03 (×2): qty 1

## 2016-01-03 MED ORDER — HYDRALAZINE HCL 50 MG PO TABS
50.0000 mg | ORAL_TABLET | Freq: Two times a day (BID) | ORAL | Status: DC
  Administered 2016-01-03 – 2016-01-04 (×3): 50 mg via ORAL
  Filled 2016-01-03 (×2): qty 1

## 2016-01-03 NOTE — Progress Notes (Signed)
[facility] PROGRESS NOTE  Vernon Blackburn SAY:[id number] DOB: 1965-12-24 DOA: 01/02/2016 PCP: [name], MD  Interim summary and HPI 51 y.o. male with a medical history of chronic kidney disease, gastric bypass, asthma, who presented to the emergency department with complaints of shortness of breath and leg swelling. Patient presented to his primary care physician's office on Friday, 12/31/2015. He was given antibiotic, prednisone, albuterol and told he was having an asthma exacerbation. Patient stated he continued to have worsening shortness of breath with exertion as well as leg swelling. 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 Patient has been noncompliant with his blood pressure medications, due to insurance coverage. And also endorses not following the best diet (a lot of fast food). Patient currently denies any chest pain.  Assessment/Plan: Dyspnea upon exertion due to acute systolic HF:  -2-D echo demonstrated EF 20-25% -will continue b-blocker, no ACE or ARB currently due to acute on chronic renal failure -will start hydralazine and Imdur -cardiology consulted -will continue daily weights and strict intake and output -will continue IV lasix  Chronic kidney disease, unknown stage -Last creatinine in March 2017 was 2.5 (found under care everywhere) -Patient does follow with Dr. [name], however, missed his last appointment -Creatinine upon admission 3.51 -improved with diuresis and currently 3.0 -will follow trend and continue diuresis for now  Accelerated hypertension -Patient admits to not being compliant with his medications due to recent insurance changes -will resume labetalol and start patient on hydralazine and imdur -lasix for CHF aslo contributing with BP control -advise to follow low sodium diet  -Will avoid ACE inhibitor or ARB due to current kidney function  Asthma -Currently stable, continue  albuterol as needed  Chronic normocytic anemia/anemia of chronic disease -Baseline hemoglobin appears to be 11 (per care everywhere) -Upon admission, hemoglobin 10.2 -Continue to monitor CBC -no signs of bleeding; most likely drop in bleeding is hemodilution from fluid overload   Mildly elevated LFTs -Continue to monitor CMP intermittently -most likely associated with hepatic congestion  Code Status: Full code. Family Communication: no family at bedside Disposition Plan: will remain inpatient, continue IV lasix; start hydralazine and imdur, continue B-blocker. Cardiology consulted and will follow rec's.   Consultants:  Cardiology   Procedures:  2-D echo - Left ventricle: The cavity size was mildly dilated. There was   mild concentric hypertrophy. Systolic function was severely   reduced. The estimated ejection fraction was in the range of 20%   to 25%. Wall motion was normal; there were no regional wall   motion abnormalities. - Aortic root: The aortic root was mildly dilated. - Mitral valve: There was severe regurgitation directed   eccentrically and toward the free wall. - Left atrium: The atrium was severely dilated. - Right atrium: The atrium was mildly to moderately dilated. - Tricuspid valve: There was moderate regurgitation. - Pulmonary arteries: Systolic pressure was moderately increased.   PA peak pressure: 52 mm Hg (S).  Antibiotics:  None   HPI/Subjective: Feeling somewhat better. Denies CP and endorses breathing is improved. Still fluid overload (positive LE edema, bibasilar crackles on auscultation and positive JVD)  Objective: Vitals:   01/03/16 0740 01/03/16 1100  BP:  133/89  Pulse: 79 79  Resp: 17   Temp:      Intake/Output Summary (Last 24 hours) at 01/03/16 1340 Last data filed at 01/03/16 0600  Gross per 24 hour  Intake  490 ml  Output             1000 ml  Net             -510 ml   Filed Weights   01/02/16 1217 01/02/16  1821 01/03/16 0657  Weight: 108.9 kg (240 lb) 112 kg (247 lb) 108.9 kg (240 lb 1.6 oz)    Exam:   General:  Afebrile, reports feeling better and with improvement in his fluid overload status. denies CP  Cardiovascular: S1 and S2, no rubs, no gallops, mild JVD appreciated  Respiratory: good air movement, positive bibasilar crackles   Abdomen: no guarding, positive BS, patient with increase abd girth   Musculoskeletal: no cyanosis, 2 ++ edema bilaterally   Data Reviewed: Basic Metabolic Panel:  Recent Labs Lab 01/02/16 1240 01/03/16 0546  NA 138 140  K 4.2 4.0  CL 107 106  CO2 22 26  GLUCOSE 114* 90  BUN 70* 63*  CREATININE 3.51* 3.05*  CALCIUM 8.3* 8.1*   Liver Function Tests:  Recent Labs Lab 01/02/16 1240  AST 60*  ALT 91*  ALKPHOS 57  BILITOT 0.9  PROT 5.0*  ALBUMIN 3.2*   CBC:  Recent Labs Lab 01/02/16 1240  WBC 10.3  HGB 10.2*  HCT 31.2*  MCV 72.2*  PLT 407*   BNP (last 3 results)  Recent Labs  01/02/16 1240  BNP 1,225.2*    Studies: Dg Chest 2 View  Result Date: 01/02/2016 CLINICAL DATA:  Mid chest pain EXAM: CHEST  2 VIEW COMPARISON:  12/14/2012 FINDINGS: Cardiomegaly. No focal airspace opacities or edema. No acute bony abnormality. IMPRESSION: Cardiomegaly.  No active disease. Electronically Signed   By: Rolm Baptise M.D.   On: 01/02/2016 12:37    Scheduled Meds: . furosemide  40 mg Intravenous BID  . heparin  5,000 Units Subcutaneous Q8H  . hydrALAZINE  50 mg Oral BID  . isosorbide mononitrate  30 mg Oral Daily  . labetalol  100 mg Oral TID  . mometasone-formoterol  2 puff Inhalation BID  . sodium chloride flush  3 mL Intravenous Q12H   Continuous Infusions:  Active Problems:   Edema   DOE (dyspnea on exertion)   CKD (chronic kidney disease)   Anemia, chronic disease   Accelerated hypertension    Time spent: 25 minutes    Barton Dubois  [facility] Pager [phone]. If 7PM-7AM, please contact night-coverage  at [email or web address], password [facility] 01/03/2016, 1:40 PM  LOS: 1 day

## 2016-01-03 NOTE — Progress Notes (Signed)
  Echocardiogram 2D Echocardiogram has been performed.  Vernon Blackburn 01/03/2016, 10:04 AM

## 2016-01-04 ENCOUNTER — Encounter (HOSPITAL_COMMUNITY): Payer: Self-pay | Admitting: Student

## 2016-01-04 DIAGNOSIS — N189 Chronic kidney disease, unspecified: Secondary | ICD-10-CM

## 2016-01-04 DIAGNOSIS — I340 Nonrheumatic mitral (valve) insufficiency: Secondary | ICD-10-CM

## 2016-01-04 DIAGNOSIS — I272 Pulmonary hypertension, unspecified: Secondary | ICD-10-CM

## 2016-01-04 DIAGNOSIS — N179 Acute kidney failure, unspecified: Secondary | ICD-10-CM

## 2016-01-04 LAB — BASIC METABOLIC PANEL
Anion gap: 8 (ref 5–15)
BUN: 57 mg/dL — ABNORMAL HIGH (ref 6–20)
CO2: 28 mmol/L (ref 22–32)
Calcium: 8 mg/dL — ABNORMAL LOW (ref 8.9–10.3)
Chloride: 106 mmol/L (ref 101–111)
Creatinine, Ser: 2.78 mg/dL — ABNORMAL HIGH (ref 0.61–1.24)
GFR calc Af Amer: 29 mL/min — ABNORMAL LOW (ref >60)
GFR calc non Af Amer: 25 mL/min — ABNORMAL LOW (ref >60)
Glucose, Bld: 89 mg/dL (ref 65–99)
Potassium: 3.6 mmol/L (ref 3.5–5.1)
Sodium: 142 mmol/L (ref 135–145)

## 2016-01-04 MED ORDER — HYDRALAZINE HCL 50 MG PO TABS
50.0000 mg | ORAL_TABLET | Freq: Three times a day (TID) | ORAL | Status: DC
  Administered 2016-01-04 – 2016-01-06 (×6): 50 mg via ORAL
  Filled 2016-01-04 (×6): qty 1

## 2016-01-04 MED ORDER — FUROSEMIDE 10 MG/ML IJ SOLN
60.0000 mg | Freq: Two times a day (BID) | INTRAMUSCULAR | Status: DC
  Administered 2016-01-04 – 2016-01-05 (×3): 60 mg via INTRAVENOUS
  Filled 2016-01-04 (×4): qty 6

## 2016-01-04 MED ORDER — CARVEDILOL 6.25 MG PO TABS
6.2500 mg | ORAL_TABLET | Freq: Two times a day (BID) | ORAL | Status: DC
  Administered 2016-01-04 – 2016-01-05 (×3): 6.25 mg via ORAL
  Filled 2016-01-04 (×3): qty 1

## 2016-01-04 NOTE — Consult Note (Signed)
Cardiology Consult    Patient ID: Vernon Blackburn MRN: [id number], DOB/AGE: 50-Jun-1967   Admit date: 01/02/2016 Date of Consult: 01/04/2016  Primary Physician: [name], MD Reason for Consult: CHF Primary Cardiologist: New - Dr. Radford Pax Requesting Provider: Dr. Dyann Kief   History of Present Illness    Vernon Blackburn is a 51 y.o. male with past medical history of Stage 3-4 CKD, HTN, asthma, and history of gastric bypass in 2014 who presented to [place] ED on 01/02/2016 for worsening dyspnea with exertion and lower extremity edema.   Had been seen by his PCP two days prior and diagnosed with an asthma exacerbation. His symptoms did not improve with antibiotics, steroids, and inhalers, therefore he came to the ED for further evaluation.  He reports a 20+ pound weight gain in the past month. Had noticed worsening lower extremity edema, orthopnea, PND, and abdominal distension. Denies any recent chest discomfort or palpitations. Reports his edema was so severe he was barely able to climb one set of stairs at home without stopping to rest.   BP was initially elevated to 155/119 as he reported not taking his BP medications over the past several days due to changes in insurance coverage.   Initial labs showed a WBC of 10.3, Hgb 10.2, and platelets 407. BNP 1225. Creatinine 3.51 (improved to 2.78 today). CXR showed cardiomegaly with no active disease. EKG showed NSR, HR 80, with mild TWI along the inferior and lateral leads with occasional PVC's.    An echocardiogram has been performed and shows a EF of 20-25% with no regional WMA. Severe MR is noted with a severely dilated LA and mildly to moderately dilated RA and PA peak pressure of 52 mm Hg (S).  He has been diuresed with IV Lasix with a recorded net output of -2.4L. Weight is down 6 lbs (240 --> 234 lbs). He has been continued on Labetalol with Hydralazine and Imdur being added to his medication regimen. Not started on an  ACE-I/ARB secondary to his CKD.   He denies any prior cardiac history. Was seen by a Cardiologist at [facility] in 2013 prior to his gastric bypass surgery. Had an echocardiogram and stress test performed at that time which the patient reports were "normal".  Family history is significant for CHF in his mother. He denies any prior history of HLD or Type 2 DM. Was prediabetic prior to his gastric bypass, but has lost over 180 lbs. Denies any excessive alcohol use. No tobacco use.   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    Past Medical History   Past Medical History:  Diagnosis Date  . Arthritis    Gout  . Asthma   . CHF (congestive heart failure) ([place])    a. 01/2016: echo showing EF of 20-25%, no WMA, severe MR, and PA Peak Pressure of 52 mm Hg.   Marland Kitchen Elbow pain, left   . Knee pain, bilateral   . Left ankle pain 11/04/2010   Gout  . Leg pain, bilateral   . Peripheral edema    Bilateral lower extremity   . Venous insufficiency     Past Surgical History:  Procedure Laterality Date  . GASTRIC BYPASS  2014  . MENISCUS REPAIR  05/2008   right      Allergies  No Known Allergies  Inpatient Medications    . carvedilol  6.25 mg Oral BID  WC  . furosemide  60 mg Intravenous BID  . heparin  5,000 Units Subcutaneous Q8H  . hydrALAZINE  50 mg Oral BID  . isosorbide mononitrate  30 mg Oral Daily  . mometasone-formoterol  2 puff Inhalation BID  . sodium chloride flush  3 mL Intravenous Q12H    Family History    Family History  Problem Relation Age of Onset  . Diabetes Mother   . Hypertension Mother   . Heart failure Mother     Social History    Social History   Social History  . Marital status: Married    Spouse name: N/A  . Number of children: N/A  . Years of education: N/A   Occupational History  . Not on file.   Social History Main Topics  . Smoking status: Never Smoker  . Smokeless  tobacco: Never Used  . Alcohol use No  . Drug use: No  . Sexual activity: Not on file   Other Topics Concern  . Not on file   Social History Narrative  . No narrative on file     Review of Systems    General:  No chills, fever, night sweats or weight changes.  Cardiovascular:  No chest pain or palpitations. Positive for dyspnea on exertion, edema, orthopnea, and PND.  Dermatological: No rash, lesions/masses Respiratory: No cough, Positive for dyspnea. Urologic: No hematuria, dysuria Abdominal:   No nausea, vomiting, diarrhea, bright red blood per rectum, melena, or hematemesis Neurologic:  No visual changes, wkns, changes in mental status. All other systems reviewed and are otherwise negative except as noted above.  Physical Exam    Blood pressure 135/90, pulse 77, temperature 98.4 F (36.9 C), temperature source Oral, resp. rate 16, height 5\' 10"  (1.778 m), weight 234 lb (106.1 kg), SpO2 98 %.  General: Pleasant, African American male appearing in NAD. Psych: Normal affect. Neuro: Alert and oriented X 3. Moves all extremities spontaneously. HEENT: Normal  Neck: Supple without bruits. JVD at 9cm. Lungs:  Resp regular and unlabored, CTA without wheezing or rales. Heart: RRR no s3, s4, 2/6 SEM at [place]. Abdomen: Soft, non-tender, BS + x 4. Appears distended.  Extremities: No clubbing or cyanosis. 1+ pitting edema bilaterally. DP/PT/Radials 2+ and equal bilaterally.  Labs    Troponin [facility] of Care Test)  Recent Labs  01/02/16 1249  TROPIPOC 0.03   No results for input(s): CKTOTAL, CKMB, TROPONINI in the last 72 hours. Lab Results  Component Value Date   WBC 10.3 01/02/2016   HGB 10.2 (L) 01/02/2016   HCT 31.2 (L) 01/02/2016   MCV 72.2 (L) 01/02/2016   PLT 407 (H) 01/02/2016    Recent Labs Lab 01/02/16 1240  01/04/16 0509  NA 138  < > 142  K 4.2  < > 3.6  CL 107  < > 106  CO2 22  < > 28  BUN 70*  < > 57*  CREATININE 3.51*  < > 2.78*  CALCIUM 8.3*  < > 8.0*   PROT 5.0*  --   --   BILITOT 0.9  --   --   ALKPHOS 57  --   --   ALT 91*  --   --   AST 60*  --   --   GLUCOSE 114*  < > 89  < > = values in this interval not displayed. No results found for: CHOL, HDL, LDLCALC, TRIG No results found for: [facility]   Radiology Studies    Dg Chest  2 View  Result Date: 01/02/2016 CLINICAL DATA:  Mid chest pain EXAM: CHEST  2 VIEW COMPARISON:  12/14/2012 FINDINGS: Cardiomegaly. No focal airspace opacities or edema. No acute bony abnormality. IMPRESSION: Cardiomegaly.  No active disease. Electronically Signed   By: Rolm Baptise M.D.   On: 01/02/2016 12:37    EKG & Cardiac Imaging    EKG: NSR, HR 80, with mild TWI along the inferior and lateral leads with occasional PVC's.   Echocardiogram: 01/03/2016 Study Conclusions  - Left ventricle: The cavity size was mildly dilated. There was   mild concentric hypertrophy. Systolic function was severely   reduced. The estimated ejection fraction was in the range of 20%   to 25%. Wall motion was normal; there were no regional wall   motion abnormalities. - Aortic root: The aortic root was mildly dilated. - Mitral valve: There was severe regurgitation directed   eccentrically and toward the free wall. - Left atrium: The atrium was severely dilated. - Right atrium: The atrium was mildly to moderately dilated. - Tricuspid valve: There was moderate regurgitation. - Pulmonary arteries: Systolic pressure was moderately increased.   PA peak pressure: 52 mm Hg (S).  Assessment & Plan    1. Acute Systolic CHF/ New Cardiomyopathy - presented with worsening dyspnea with exertion, orthopnea, abdominal distention and lower extremity edema for the past month. Weight went up 20lbs from baseline.   - BNP at 1225 with echocardiogram showing a reduced EF of 20-25% with no regional WMA, severe MR, severely dilated LA and mildly to moderately dilated RA. Last echo and NST in 2013 were "normal" according to the patient. Can  see where these were performed in care Everywhere but results are not available for review.  - remains on IV Lasix with a net output of -2.4L and weight down 6 lbs (240 --> 234 lbs). He still appears volume overloaded and with his CKD, he will likely require a higher dose. Will increase from 40mg  BID to 60mg  BID. Repeat BMET in AM. - switch Labetalol to Coreg. Continue Hydralazine and Imdur. No ACE-I/ARB secondary to his CKD.  - will review images of echo with Dr. Radford Pax, as his MR is labeled as severe but this does not match well to his exam. Will need to rule out an ischemic etiology. Would avoid a cath with his AKI and Stage 3-4 CKD. Consider NST tomorrow.   2. Severe MR - echo on 01/03/2015 showed severe regurgitation directed eccentrically and toward the free wall with a peak gradient (D) of 4 mm Hg. His murmur does not sound severe on exam.  - will review echo images with Dr. Radford Pax.   3. Stage 3-4 CKD - creatinine 1.84 in 02/2015. Elevated to 3.51 on admission, improved to 2.78 this AM.  - followed by Dr. [name] as an outpatient.   4. HTN - BP at 131/89 -135/93 in the past 24 hours.  - will switch Labetalol to Coreg. Continue Hydralazine and Imdur.    Signed, Erma Heritage, PA-C 01/04/2016, 10:57 AM Pager: [phone]

## 2016-01-04 NOTE — Progress Notes (Signed)
[facility] PROGRESS NOTE  BARTOSZ LUGINBILL BMW:[id number] DOB: 07-31-1965 DOA: 01/02/2016 PCP: [name], MD  Interim summary and HPI 51 y.o. male with a medical history of chronic kidney disease, gastric bypass, asthma, who presented to the emergency department with complaints of shortness of breath and leg swelling. Patient presented to his primary care physician's office on Friday, 12/31/2015. He was given antibiotic, prednisone, albuterol and told he was having an asthma exacerbation. Patient stated he continued to have worsening shortness of breath with exertion as well as leg swelling. 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 Patient has been noncompliant with his blood pressure medications, due to insurance coverage. And also endorses not following the best diet (a lot of fast food). Patient currently denies any chest pain.  Assessment/Plan: Dyspnea upon exertion due to acute systolic HF:  -2-D echo demonstrated EF 20-25% (new diagnosis) -will continue b-blocker (but changed to Coreg), no ACE or ARB currently due to acute on chronic renal failure -started on hydralazine and Imdur -cardiology consulted, will follow rec's -will continue daily weights and strict intake and output -will continue IV lasix (dose adjusted to 60mg  BID as per cardiology rec's) -neg 2.5 L so far -will need ischemic evaluation and depending progression ICD at some point.  Chronic kidney disease, unknown stage (but per GFR presumed 3-4) -Last creatinine in March 2017 was 2.5 (found under care everywhere) -Patient does follow with Dr. [name], however, missed his last appointment -Creatinine upon admission 3.51 -improved with diuresis and currently 2.7 -will follow trend and continue diuresis for now  Accelerated hypertension -Patient admits to not being compliant with his medications due to recent insurance changes -will continue b-blocker  (coreg now), also on hydralazine and Imdur -lasix for CHF aslo contributing with BP control -advise to follow low sodium diet  -Will avoid ACE inhibitor or ARB due to current kidney function  Asthma -Currently stable, continue albuterol as needed -no wheezing   Chronic normocytic anemia/anemia of chronic disease -Baseline hemoglobin appears to be 11 (per care everywhere) -Upon admission, hemoglobin 10.2 -Continue to monitor CBC -no signs of bleeding; most likely drop in bleeding is hemodilution from fluid overload   Mildly elevated LFTs -Continue to monitor CMP intermittently -most likely associated with hepatic congestion  Code Status: Full code. Family Communication: no family at bedside Disposition Plan: will remain inpatient, continue IV lasix; started on hydralazine and imdur, continue B-blocker. Cardiology consulted and will follow rec's.   Consultants:  Cardiology   Procedures:  2-D echo - Left ventricle: The cavity size was mildly dilated. There was   mild concentric hypertrophy. Systolic function was severely   reduced. The estimated ejection fraction was in the range of 20%   to 25%. Wall motion was normal; there were no regional wall   motion abnormalities. - Aortic root: The aortic root was mildly dilated. - Mitral valve: There was severe regurgitation directed   eccentrically and toward the free wall. - Left atrium: The atrium was severely dilated. - Right atrium: The atrium was mildly to moderately dilated. - Tricuspid valve: There was moderate regurgitation. - Pulmonary arteries: Systolic pressure was moderately increased.   PA peak pressure: 52 mm Hg (S).  Antibiotics:  None   HPI/Subjective: Feeling much better and endorses good urine output. Denies CP and endorses breathing is much improved. Still fluid overload (positive LE edema, bibasilar crackles on auscultation and positive JVD).  Objective: Vitals:  01/04/16 0451 01/04/16 1544  BP:  135/90 133/87  Pulse: 77 79  Resp: 16 16  Temp: 98.4 F (36.9 C) 98.4 F (36.9 C)    Intake/Output Summary (Last 24 hours) at 01/04/16 1814 Last data filed at 01/04/16 0600  Gross per 24 hour  Intake              120 ml  Output              800 ml  Net             -680 ml   Filed Weights   01/02/16 1821 01/03/16 0657 01/04/16 0451  Weight: 112 kg (247 lb) 108.9 kg (240 lb 1.6 oz) 106.1 kg (234 lb)    Exam:   General:  Afebrile, reports feeling much better and with great improvement in his fluid overload status. denies CP and endorse walking on the hallway w/o problems.  Cardiovascular: S1 and S2, no rubs, no gallops, mild JVD appreciated  Respiratory: good air movement, scattered positive bibasilar crackles   Abdomen: no guarding, positive BS, patient with increase abd girth still present    Musculoskeletal: no cyanosis, 2 ++ edema bilaterally   Data Reviewed: Basic Metabolic Panel:  Recent Labs Lab 01/02/16 1240 01/03/16 0546 01/04/16 0509  NA 138 140 142  K 4.2 4.0 3.6  CL 107 106 106  CO2 22 26 28   GLUCOSE 114* 90 89  BUN 70* 63* 57*  CREATININE 3.51* 3.05* 2.78*  CALCIUM 8.3* 8.1* 8.0*   Liver Function Tests:  Recent Labs Lab 01/02/16 1240  AST 60*  ALT 91*  ALKPHOS 57  BILITOT 0.9  PROT 5.0*  ALBUMIN 3.2*   CBC:  Recent Labs Lab 01/02/16 1240  WBC 10.3  HGB 10.2*  HCT 31.2*  MCV 72.2*  PLT 407*   BNP (last 3 results)  Recent Labs  01/02/16 1240  BNP 1,225.2*    Studies: No results found.  Scheduled Meds: . carvedilol  6.25 mg Oral BID WC  . furosemide  60 mg Intravenous BID  . heparin  5,000 Units Subcutaneous Q8H  . hydrALAZINE  50 mg Oral Q8H  . isosorbide mononitrate  30 mg Oral Daily  . mometasone-formoterol  2 puff Inhalation BID  . sodium chloride flush  3 mL Intravenous Q12H   Continuous Infusions:  Principal Problem:   Acute systolic heart failure (HCC) Active Problems:   Edema   DOE (dyspnea on  exertion)   CKD (chronic kidney disease)   Anemia, chronic disease   Accelerated hypertension   Acute renal failure superimposed on chronic kidney disease ([place])   Pulmonary HTN   Non-rheumatic mitral regurgitation    Time spent: 25 minutes    Vernon Blackburn  [facility] Pager [phone]. If 7PM-7AM, please contact night-coverage at [email or web address], password [facility] 01/04/2016, 6:14 PM  LOS: 2 days

## 2016-01-05 ENCOUNTER — Encounter (HOSPITAL_COMMUNITY): Payer: Self-pay

## 2016-01-05 DIAGNOSIS — I340 Nonrheumatic mitral (valve) insufficiency: Secondary | ICD-10-CM

## 2016-01-05 DIAGNOSIS — N183 Chronic kidney disease, stage 3 (moderate): Secondary | ICD-10-CM

## 2016-01-05 DIAGNOSIS — I272 Pulmonary hypertension, unspecified: Secondary | ICD-10-CM

## 2016-01-05 DIAGNOSIS — N170 Acute kidney failure with tubular necrosis: Secondary | ICD-10-CM

## 2016-01-05 LAB — BASIC METABOLIC PANEL
Anion gap: 8 (ref 5–15)
BUN: 46 mg/dL — ABNORMAL HIGH (ref 6–20)
CO2: 29 mmol/L (ref 22–32)
Calcium: 8 mg/dL — ABNORMAL LOW (ref 8.9–10.3)
Chloride: 105 mmol/L (ref 101–111)
Creatinine, Ser: 2.37 mg/dL — ABNORMAL HIGH (ref 0.61–1.24)
GFR calc Af Amer: 35 mL/min — ABNORMAL LOW (ref >60)
GFR calc non Af Amer: 31 mL/min — ABNORMAL LOW (ref >60)
Glucose, Bld: 88 mg/dL (ref 65–99)
Potassium: 3.4 mmol/L — ABNORMAL LOW (ref 3.5–5.1)
Sodium: 142 mmol/L (ref 135–145)

## 2016-01-05 NOTE — Progress Notes (Signed)
PROGRESS NOTE    Vernon Blackburn  JAS:[id number] DOB: 06-Apr-1965 DOA: 01/02/2016 PCP: [name], MD    Brief Narrative:  51 y.o.malewith a medical history of chronic kidney disease, gastric bypass, asthma, who presented to the emergency department with complaints of shortness of breath and leg swelling. Patient presented to his primary care physician's office on Friday, 12/31/2015. He was given antibiotic, prednisone, albuterol and told he was having an asthma exacerbation. Patient stated he continued to have worsening shortness of breath with exertion as well as leg swelling. He states he has been under stress lately as he is a Education administrator at [employer]. Patient has been noncompliant with his blood pressure medications, due to insurance coverage. And also endorses not following the best diet (a lot of fast food). Patient currently denies any chest pain.  Assessment & Plan:   Principal Problem:   Acute systolic heart failure (HCC) Active Problems:   Edema   DOE (dyspnea on exertion)   CKD (chronic kidney disease)   Anemia, chronic disease   Accelerated hypertension   Acute renal failure superimposed on chronic kidney disease (HCC)   Pulmonary HTN   Non-rheumatic mitral regurgitation  Dyspnea upon exertion due to acute systolic HF:  -2-D echo demonstrated EF 20-25% (new diagnosis) -patient continued on b-blocker, Coreg, no ACE or ARB currently due to acute on chronic renal failure -remains on hydralazine and Imdur -cardiology is following -will continue daily weights and strict intake and output. -will continue IV lasix. Patient is responding to lasix -Cardiology recommendations for stress testing tomorrow. Cath if study is high risk given acute on CKD  Chronic kidney disease, unknown stage (but per GFR presumed 3-4) -Last creatinine in March 2017 was 2.5 (found under care everywhere) -Patient does follow with Dr. [name], however, missed his last  appointment -Creatinine upon admission 3.51 -Cr is improving with diuresis. -Repeat BMET in am  Accelerated hypertension -Patient admits to not being compliant with his medications due to recent insurance changes -will continue b-blocker (coreg now), also on hydralazine and Imdur -advise to follow low sodium diet  -Will avoid ACE inhibitor or ARB due to current kidney dysfunction -bp currently stable, controlled  Asthma -Currently stable, continued on albuterol as needed -no wheezing at present  Chronic normocytic anemia/anemia of chronic disease -Baseline hemoglobin appears to be 11 (per care everywhere) -Most recent hgb of 10.2 -repeat CBC in AM  Mildly elevated LFTs -most likely associated with hepatic congestion -Will repeat LFTs in AM  DVT prophylaxis: Heparin subQ Code Status: Full Family Communication: Pt in room, family not at bedside Disposition Plan: Uncertain at this time  Consultants:   Cardiology  Procedures:     Antimicrobials: Anti-infectives    None      Subjective: No complaints  Objective: Vitals:   01/04/16 2118 01/05/16 0540 01/05/16 0803 01/05/16 1515  BP: (!) 134/92 (!) 140/98  122/80  Pulse: 80 85  85  Resp: 16 16  18   Temp: 98.4 F (36.9 C) 98 F (36.7 C)  98 F (36.7 C)  TempSrc: Oral Oral  Oral  SpO2: 100% 100% 99% 100%  Weight:  100.7 kg (222 lb 1.6 oz)    Height:        Intake/Output Summary (Last 24 hours) at 01/05/16 1607 Last data filed at 01/05/16 0330  Gross per 24 hour  Intake              720 ml  Output  2175 ml  Net            -1455 ml   Filed Weights   01/03/16 0657 01/04/16 0451 01/05/16 0540  Weight: 108.9 kg (240 lb 1.6 oz) 106.1 kg (234 lb) 100.7 kg (222 lb 1.6 oz)    Examination:  General exam: Appears calm and comfortable  Respiratory system: Clear to auscultation. Respiratory effort normal. Cardiovascular system: S1 & S2 heard, RRR. Gastrointestinal system: Abdomen is  nondistended, soft and nontender. No organomegaly or masses felt. Normal bowel sounds heard. Central nervous system: Alert and oriented. No focal neurological deficits. Extremities: Symmetric 5 x 5 power. Skin: No rashes, lesions Psychiatry: Judgement and insight appear normal. Mood & affect appropriate.   Data Reviewed: I have personally reviewed following labs and imaging studies  CBC:  Recent Labs Lab 01/02/16 1240  WBC 10.3  HGB 10.2*  HCT 31.2*  MCV 72.2*  PLT 854*   Basic Metabolic Panel:  Recent Labs Lab 01/02/16 1240 01/03/16 0546 01/04/16 0509 01/05/16 0557  NA 138 140 142 142  K 4.2 4.0 3.6 3.4*  CL 107 106 106 105  CO2 22 26 28 29   GLUCOSE 114* 90 89 88  BUN 70* 63* 57* 46*  CREATININE 3.51* 3.05* 2.78* 2.37*  CALCIUM 8.3* 8.1* 8.0* 8.0*   GFR: Estimated Creatinine Clearance: 44.8 mL/min (by C-G formula based on SCr of 2.37 mg/dL (H)). Liver Function Tests:  Recent Labs Lab 01/02/16 1240  AST 60*  ALT 91*  ALKPHOS 57  BILITOT 0.9  PROT 5.0*  ALBUMIN 3.2*   No results for input(s): LIPASE, AMYLASE in the last 168 hours. No results for input(s): AMMONIA in the last 168 hours. Coagulation Profile:  Recent Labs Lab 01/02/16 1240  INR 1.34   Cardiac Enzymes: No results for input(s): CKTOTAL, CKMB, CKMBINDEX, TROPONINI in the last 168 hours. BNP (last 3 results) No results for input(s): PROBNP in the last 8760 hours. HbA1C: No results for input(s): HGBA1C in the last 72 hours. CBG: No results for input(s): GLUCAP in the last 168 hours. Lipid Profile: No results for input(s): CHOL, HDL, LDLCALC, TRIG, CHOLHDL, LDLDIRECT in the last 72 hours. Thyroid Function Tests: No results for input(s): TSH, T4TOTAL, FREET4, T3FREE, THYROIDAB in the last 72 hours. Anemia Panel: No results for input(s): VITAMINB12, FOLATE, FERRITIN, TIBC, IRON, RETICCTPCT in the last 72 hours. Sepsis Labs: No results for input(s): PROCALCITON, LATICACIDVEN in the last  168 hours.  No results found for this or any previous visit (from the past 240 hour(s)).   Radiology Studies: No results found.  Scheduled Meds: . carvedilol  6.25 mg Oral BID WC  . furosemide  60 mg Intravenous BID  . heparin  5,000 Units Subcutaneous Q8H  . hydrALAZINE  50 mg Oral Q8H  . isosorbide mononitrate  30 mg Oral Daily  . mometasone-formoterol  2 puff Inhalation BID  . sodium chloride flush  3 mL Intravenous Q12H   Continuous Infusions:   LOS: 3 days   Lorea Kupfer, Orpah Melter, MD [facility] Pager [phone]  If 7PM-7AM, please contact night-coverage [email or web address] Password [email or web address] 01/05/2016, 4:07 PM

## 2016-01-05 NOTE — Care Management Note (Signed)
Case Management Note  Patient Details  Name: Vernon Blackburn MRN: [id number] Date of Birth: 11/07/1965  Subjective/Objective:  51 y/o m admitted w/CHF. From home. Works, has a Sports administrator, Administrator, sports. Will check w/the advanced heart failure clinic for screening.                  Action/Plan:d/c plan home.   Expected Discharge Date:                 Expected Discharge Plan:  Home/Self Care  In-House Referral:     Discharge planning Services  CM Consult  Post Acute Care Choice:    Choice offered to:     DME Arranged:    DME Agency:     HH Arranged:    HH Agency:     Status of Service:  In process, will continue to follow  If discussed at Long Length of Stay Meetings, dates discussed:    Additional Comments:  Dessa Phi, RN 01/05/2016, 2:17 PM

## 2016-01-05 NOTE — Progress Notes (Signed)
Patient Name: Vernon Blackburn Date of Encounter: 01/05/2016  Primary Cardiologist: New - Dr. Sundra Aland Problem List     Principal Problem:   Acute systolic heart failure [facility]) Active Problems:   Edema   DOE (dyspnea on exertion)   CKD (chronic kidney disease)   Anemia, chronic disease   Accelerated hypertension   Acute renal failure superimposed on chronic kidney disease (HCC)   Pulmonary HTN   Non-rheumatic mitral regurgitation    Subjective   Breathing at baseline. No chest discomfort or palpitations. Having issues with urination at time (says this has occurred in the past and thought to be secondary to his prostate).   Inpatient Medications    Scheduled Meds: . carvedilol  6.25 mg Oral BID WC  . furosemide  60 mg Intravenous BID  . heparin  5,000 Units Subcutaneous Q8H  . hydrALAZINE  50 mg Oral Q8H  . isosorbide mononitrate  30 mg Oral Daily  . mometasone-formoterol  2 puff Inhalation BID  . sodium chloride flush  3 mL Intravenous Q12H   Continuous Infusions:  PRN Meds: sodium chloride, acetaminophen, albuterol, hydrALAZINE, ondansetron (ZOFRAN) IV, sodium chloride flush   Vital Signs    Vitals:   01/04/16 2038 01/04/16 2118 01/05/16 0540 01/05/16 0803  BP:  (!) 134/92 (!) 140/98   Pulse:  80 85   Resp:  16 16   Temp:  98.4 F (36.9 C) 98 F (36.7 C)   TempSrc:  Oral Oral   SpO2: 98% 100% 100% 99%  Weight:   222 lb 1.6 oz (100.7 kg)   Height:        Intake/Output Summary (Last 24 hours) at 01/05/16 0840 Last data filed at 01/05/16 0330  Gross per 24 hour  Intake              720 ml  Output             2175 ml  Net            -1455 ml   Filed Weights   01/03/16 0657 01/04/16 0451 01/05/16 0540  Weight: 240 lb 1.6 oz (108.9 kg) 234 lb (106.1 kg) 222 lb 1.6 oz (100.7 kg)    Physical Exam    GEN: Well nourished, well developed African American male appearing in no acute distress.  HEENT: Grossly normal.  Neck: Supple, no JVD, carotid  bruits, or masses. Cardiac: RRR, no rubs, or gallops. 2/6 SEM at [place]. No clubbing or cyanosis. 1+ lower extremity edema bilaterally.  Radials/DP/PT 2+ and equal bilaterally.  Respiratory:  Respirations regular and unlabored, clear to auscultation bilaterally. GI: Soft, nontender, nondistended, BS + x 4. MS: no deformity or atrophy. Skin: warm and dry, no rash. Neuro:  Strength and sensation are intact. Psych: AAOx3.  Normal affect.  Labs    CBC  Recent Labs  01/02/16 1240  WBC 10.3  HGB 10.2*  HCT 31.2*  MCV 72.2*  PLT 846*   Basic Metabolic Panel  Recent Labs  01/04/16 0509 01/05/16 0557  NA 142 142  K 3.6 3.4*  CL 106 105  CO2 28 29  GLUCOSE 89 88  BUN 57* 46*  CREATININE 2.78* 2.37*  CALCIUM 8.0* 8.0*   Liver Function Tests  Recent Labs  01/02/16 1240  AST 60*  ALT 91*  ALKPHOS 57  BILITOT 0.9  PROT 5.0*  ALBUMIN 3.2*   No results for input(s): LIPASE, AMYLASE in the last 72 hours. Cardiac Enzymes No results  for input(s): CKTOTAL, CKMB, CKMBINDEX, TROPONINI in the last 72 hours. BNP Invalid input(s): POCBNP D-Dimer No results for input(s): DDIMER in the last 72 hours. Hemoglobin A1C No results for input(s): HGBA1C in the last 72 hours. Fasting Lipid Panel No results for input(s): CHOL, HDL, LDLCALC, TRIG, CHOLHDL, LDLDIRECT in the last 72 hours. Thyroid Function Tests No results for input(s): TSH, T4TOTAL, T3FREE, THYROIDAB in the last 72 hours.  Invalid input(s): FREET3  Telemetry    HR in 70's to low-100's. Frequent PVC's.  - Personally Reviewed  ECG    No new tracings.   Radiology    No results found.  Cardiac Studies   Echocardiogram: 01/03/2016 Study Conclusions  - Left ventricle: The cavity size was mildly dilated. There was mild concentric hypertrophy. Systolic function was severely reduced. The estimated ejection fraction was in the range of 20% to 25%. Wall motion was normal; there were no regional wall motion  abnormalities. - Aortic root: The aortic root was mildly dilated. - Mitral valve: There was severe regurgitation directed eccentrically and toward the free wall. - Left atrium: The atrium was severely dilated. - Right atrium: The atrium was mildly to moderately dilated. - Tricuspid valve: There was moderate regurgitation. - Pulmonary arteries: Systolic pressure was moderately increased. PA peak pressure: 52 mm Hg (S).  Patient Profile     51 y.o. male w/ PMH of Stage 3-4 CKD, HTN, asthma, and history of gastric bypass in 2014 who presented to [place] ED on 01/02/2016 for worsening dyspnea with exertion and lower extremity edema. Echo shows newly reduced EF of 20-25% with severe MR.     Assessment & Plan    1. Acute Systolic CHF/ New Cardiomyopathy - presented with worsening dyspnea with exertion, orthopnea, abdominal distention and lower extremity edema for the past month. Weight went up 20lbs from baseline.   - BNP at 1225 with echocardiogram showing a reduced EF of 20-25% with no regional WMA, severe MR, severely dilated LA and mildly to moderately dilated RA. Last echo and NST in 2013 were "normal" according to the patient. Can see where these were performed in care Everywhere but results are not available for review.  - remains on IV Lasix with a net output of -4.0L and weight down from 240 --> 222 lbs. Lasix dosing increased from 40mg  BID to 60mg  BID on 1/2. - started on Coreg. Continue Hydralazine and Imdur. No ACE-I/ARB secondary to his CKD.  - will require ischemic evaluation to rule out an ischemic etiology of his new cardiomyopathy. Prefer NST over cardiac catheterization with his CKD. Would only pursue cath is NST high-risk. Plan for tomorrow and will make patient NPO after midnight. He prefers to attempt the treadmill over a Lexiscan injection.    2. Severe MR - echo on 01/03/2015 showed severe regurgitation directed eccentrically and toward the free wall with a peak  gradient (D) of 4 mm Hg. His murmur does not sound severe on exam.  - would need repeat limited echo to assess valve function once acute HF resolved.   3. Stage 3-4 CKD - creatinine 1.84 in 02/2015. Elevated to 3.51 on admission, improved to 2.37 this AM.  - followed by Dr. [name] as an outpatient.   4. HTN - BP at 133/87 - 140/98 in the past 24 hours.  - started on Coreg 6.25mg  BID. Can increase to 12.5mg  BID tomorrow if HR and BP allows. Continue Hydralazine and Imdur.    Signed, Erma Heritage, [place]  01/05/2016,  8:40 AM

## 2016-01-06 ENCOUNTER — Ambulatory Visit (HOSPITAL_COMMUNITY)
Admission: RE | Admit: 2016-01-06 | Discharge: 2016-01-06 | Disposition: A | Discharge status: Home / Self Care | Payer: Medicaid Other | Source: Ambulatory Visit | Attending: Student | Admitting: Student

## 2016-01-06 DIAGNOSIS — I420 Dilated cardiomyopathy: Secondary | ICD-10-CM

## 2016-01-06 DIAGNOSIS — R0602 Shortness of breath: Secondary | ICD-10-CM | POA: Insufficient documentation

## 2016-01-06 DIAGNOSIS — I429 Cardiomyopathy, unspecified: Secondary | ICD-10-CM

## 2016-01-06 LAB — BASIC METABOLIC PANEL
Anion gap: 11 (ref 5–15)
BUN: 42 mg/dL — ABNORMAL HIGH (ref 6–20)
CO2: 29 mmol/L (ref 22–32)
Calcium: 8.1 mg/dL — ABNORMAL LOW (ref 8.9–10.3)
Chloride: 103 mmol/L (ref 101–111)
Creatinine, Ser: 2.49 mg/dL — ABNORMAL HIGH (ref 0.61–1.24)
GFR calc Af Amer: 33 mL/min — ABNORMAL LOW (ref >60)
GFR calc non Af Amer: 29 mL/min — ABNORMAL LOW (ref >60)
Glucose, Bld: 90 mg/dL (ref 65–99)
Potassium: 3.4 mmol/L — ABNORMAL LOW (ref 3.5–5.1)
Sodium: 143 mmol/L (ref 135–145)

## 2016-01-06 LAB — NM MYOCAR MULTI W/SPECT W/WALL MOTION / EF
Peak HR: 102 {beats}/min
Rest HR: 86 {beats}/min

## 2016-01-06 MED ORDER — TECHNETIUM TC 99M TETROFOSMIN IV KIT
30.0000 | PACK | Freq: Once | INTRAVENOUS | Status: AC | PRN
  Administered 2016-01-06: 30 via INTRAVENOUS

## 2016-01-06 MED ORDER — FUROSEMIDE 40 MG PO TABS: 40.0000 mg | ORAL_TABLET | Freq: Two times a day (BID) | ORAL | Status: DC

## 2016-01-06 MED ORDER — ISOSORBIDE MONONITRATE ER 30 MG PO TB24: 30.0000 mg | ORAL_TABLET | Freq: Every day | ORAL | 0 refills | Status: DC

## 2016-01-06 MED ORDER — REGADENOSON 0.4 MG/5ML IV SOLN
0.4000 mg | Freq: Once | INTRAVENOUS | Status: AC
  Administered 2016-01-06: 0.4 mg via INTRAVENOUS

## 2016-01-06 MED ORDER — HYDRALAZINE HCL 50 MG PO TABS: 50.0000 mg | ORAL_TABLET | Freq: Three times a day (TID) | ORAL | 0 refills | Status: DC

## 2016-01-06 MED ORDER — REGADENOSON 0.4 MG/5ML IV SOLN: 0.4000 mg | Freq: Once | INTRAVENOUS | Status: DC

## 2016-01-06 MED ORDER — TECHNETIUM TC 99M TETROFOSMIN IV KIT
10.0000 | PACK | Freq: Once | INTRAVENOUS | Status: AC | PRN
Start: 2016-01-06 — End: 2016-01-06
  Administered 2016-01-06: 10 via INTRAVENOUS

## 2016-01-06 MED ORDER — REGADENOSON 0.4 MG/5ML IV SOLN
INTRAVENOUS | Status: AC
  Filled 2016-01-06: qty 5

## 2016-01-06 MED ORDER — POTASSIUM CHLORIDE CRYS ER 20 MEQ PO TBCR: 20.0000 meq | EXTENDED_RELEASE_TABLET | Freq: Every day | ORAL | Status: DC

## 2016-01-06 MED ORDER — FUROSEMIDE 40 MG PO TABS: 40.0000 mg | ORAL_TABLET | Freq: Two times a day (BID) | ORAL | 0 refills | Status: DC

## 2016-01-06 MED ORDER — CARVEDILOL 12.5 MG PO TABS: 12.5000 mg | ORAL_TABLET | Freq: Two times a day (BID) | ORAL | 0 refills | Status: DC

## 2016-01-06 MED ORDER — POTASSIUM CHLORIDE CRYS ER 20 MEQ PO TBCR
20.0000 meq | EXTENDED_RELEASE_TABLET | Freq: Once | ORAL | Status: AC
  Administered 2016-01-06: 20 meq via ORAL
  Filled 2016-01-06: qty 1

## 2016-01-06 MED ORDER — CARVEDILOL 12.5 MG PO TABS
12.5000 mg | ORAL_TABLET | Freq: Two times a day (BID) | ORAL | Status: DC
  Administered 2016-01-06 (×2): 12.5 mg via ORAL
  Filled 2016-01-06 (×2): qty 1

## 2016-01-06 NOTE — Care Management Note (Signed)
Case Management Note  Patient Details  Name: ZAKI GERTSCH MRN: [id number] Date of Birth: 1965-02-03  Subjective/Objective: F/u on Advanced heart failure clinic-spoke to Heather-since patient already followed by cardiology-they will continue to follow as otpt,& if Advanced CHF is needed cardioogy will refer.                    Action/Plan:d/c plan home.   Expected Discharge Date:                 Expected Discharge Plan:  Home/Self Care  In-House Referral:     Discharge planning Services  CM Consult  Post Acute Care Choice:    Choice offered to:     DME Arranged:    DME Agency:     HH Arranged:    HH Agency:     Status of Service:  In process, will continue to follow  If discussed at Long Length of Stay Meetings, dates discussed:    Additional Comments:  Dessa Phi, RN 01/06/2016, 1:29 PM

## 2016-01-06 NOTE — Discharge Summary (Signed)
Physician Discharge Summary  Vernon Blackburn KGM:[id number] DOB: 28-Mar-1965 DOA: 01/02/2016  PCP: [name], MD  Admit date: 01/02/2016 Discharge date: 01/06/2016  Admitted From: Home Disposition:  Home  Recommendations for Outpatient Follow-up:  1. Follow up with PCP in 2-3 weeks 2. Follow up with Cardiology as scheduled 3. Follow up with Nephrology as scheduled 4. Follow up with [facility] as scheduled 5. Recommend repeat BMET in 1-2 weeks, focus on electrolytes and renal function  Discharge Condition:Improved CODE STATUS:Full Diet recommendation: Heart healthy   Brief/Interim Summary: 51 y.o.malewith a medical history of chronic kidney disease, gastric bypass, asthma, who presented to the emergency department with complaints of shortness of breath and leg swelling. Patient presented to his primary care physician's office on Friday, 12/31/2015. He was given antibiotic, prednisone, albuterol and told he was having an asthma exacerbation. Patient stated he continued to have worsening shortness of breath with exertion as well as leg swelling. He states he has been under stress lately as he is a Education administrator at [employer]. Patient has been noncompliant with his blood pressure medications, due to insurance coverage. And also endorses not following the best diet (a lot of fast food). Patient currently denies any chest pain.  Dyspnea upon exertion due to acute systolic HF:  -2-D echo demonstrated EF 20-25% (new diagnosis) -patient continued on b-blocker, Coreg, no ACE or ARB currently due to acute on chronic renal failure -remains onhydralazine and Imdur -Cardiology had been following -Improved with IV lasix, transitioned to 40mg  bid PO lasix at time of discharge -Cardiology recommendations for stress testing, findings of septal and inferior wall infarcts with no reversible defects to suggest ischemia, EF of 33%. Discussed with Cardiology. OK for discharge  with close outpatient follow up.  Chronic kidney disease, unknown stage (but per GFR presumed 3-4) -Last creatinine in March 2017 was 2.5 (found under care everywhere) -Patient does follow with Dr. [name], however, missed his last appointment -Creatinine upon admission 3.51 -Cr is improving with diuresis. -Repeat BMET in 1-2 weeks recommended  Accelerated hypertension -Patient admits to not being compliant with his medications due to recent insurance changes -will continue b-blocker (coreg now), also on hydralazine and Imdur -advise to follow low sodium diet  -Will avoid ACE inhibitor or ARB due to current kidney dysfunction -bp currently stable, controlled  Asthma -Currently stable, continued on albuterol as needed -no wheezing at present  Chronic normocytic anemia/anemia of chronic disease -Baseline hemoglobin appears to be 11 (per care everywhere) -Most recent hgb of 10.2  Mildly elevated LFTs -most likely associated with hepatic congestion   Discharge Diagnoses:  Principal Problem:   Acute systolic heart failure (HCC) Active Problems:   Edema   DOE (dyspnea on exertion)   CKD (chronic kidney disease)   Anemia, chronic disease   Accelerated hypertension   Acute renal failure superimposed on chronic kidney disease ([place])   Pulmonary HTN   Non-rheumatic mitral regurgitation   Cardiomyopathy ([place])    Discharge Instructions   Allergies as of 01/06/2016   No Known Allergies     Medication List    STOP taking these medications   cloNIDine 0.3 MG tablet Commonly known as:  CATAPRES   hydrochlorothiazide 25 MG tablet Commonly known as:  HYDRODIURIL   labetalol 100 MG tablet Commonly known as:  NORMODYNE   levofloxacin 750 MG tablet Commonly known as:  LEVAQUIN   predniSONE 10 MG tablet Commonly known as:  DELTASONE     TAKE these medications  albuterol 108 (90 Base) MCG/ACT inhaler Commonly known as:  PROVENTIL HFA;VENTOLIN HFA Inhale 2 puffs into  the lungs every 6 (six) hours as needed for wheezing or shortness of breath.   albuterol (5 MG/ML) 0.5% nebulizer solution Commonly known as:  PROVENTIL Take 2.5 mg by nebulization every 6 (six) hours as needed for wheezing or shortness of breath.   carvedilol 12.5 MG tablet Commonly known as:  COREG Take 1 tablet (12.5 mg total) by mouth 2 (two) times daily with a meal.   Fluticasone-Salmeterol 500-50 MCG/DOSE Aepb Commonly known as:  ADVAIR Inhale 1 puff into the lungs 2 (two) times daily.   furosemide 40 MG tablet Commonly known as:  LASIX Take 1 tablet (40 mg total) by mouth 2 (two) times daily.   hydrALAZINE 50 MG tablet Commonly known as:  APRESOLINE Take 1 tablet (50 mg total) by mouth every 8 (eight) hours.   isosorbide mononitrate 30 MG 24 hr tablet Commonly known as:  IMDUR Take 1 tablet (30 mg total) by mouth daily. Start taking on:  01/07/2016      Follow-up Information    Lyda Jester, PA-C Follow up on 01/13/2016.   Specialties:  Cardiology, Radiology Why:  Cardiology Hospital Follow-Up on 01/13/2016 at 8:00 AM (Dr. Theodosia Blender Office).  Contact information: [address] [place] [address] [phone]        [name], MD. Schedule an appointment as soon as possible for a visit in 2 week(s).   Specialty:  Internal Medicine Contact information: [address] [place] [address] [phone]        Sherril Croon, MD Follow up.   Specialty:  Nephrology Why:  as scheduled Contact information: [place] [address] [phone]        [place]. Schedule an appointment as soon as possible for a visit in 1 week(s).   Contact information: [address] [place] [phone]         No Known Allergies  Consultations:  Cardiology  Procedures/Studies: Dg Chest 2 View  Result Date: 01/02/2016 CLINICAL DATA:  Mid chest pain EXAM: CHEST  2  VIEW COMPARISON:  12/14/2012 FINDINGS: Cardiomegaly. No focal airspace opacities or edema. No acute bony abnormality. IMPRESSION: Cardiomegaly.  No active disease. Electronically Signed   By: Rolm Baptise M.D.   On: 01/02/2016 12:37   Nm Myocar Multi W/spect W/wall Motion / Ef  Result Date: 01/06/2016 CLINICAL DATA:  Shortness of breath. EXAM: MYOCARDIAL IMAGING WITH SPECT (REST AND PHARMACOLOGIC-STRESS) GATED LEFT VENTRICULAR WALL MOTION STUDY LEFT VENTRICULAR EJECTION FRACTION TECHNIQUE: Standard myocardial SPECT imaging was performed after resting intravenous injection of 10 mCi Tc-83m tetrofosmin. Subsequently, intravenous infusion of Lexiscan was performed under the supervision of the Cardiology staff. At peak effect of the drug, 30 mCi Tc-80m tetrofosmin was injected intravenously and standard myocardial SPECT imaging was performed. Quantitative gated imaging was also performed to evaluate left ventricular wall motion, and estimate left ventricular ejection fraction. COMPARISON:  None. FINDINGS: Perfusion: Large perfusion defects are identified in the septum and inferior walls. No other perfusion abnormalities. Wall Motion: No thickening in the region of the large perfusion defects in the septum and inferior wall. Global hypokinesis. Left Ventricular Ejection Fraction: 33 % End diastolic volume 329 ml End systolic volume 924 ml IMPRESSION: 1. Septal and inferior wall infarcts. No reversible defects to suggest ischemia. 2. Global hypokinesis. No wall thickening in the region of the septal  or inferior wall infarcts. 3. Left ventricular ejection fraction 33% 4. Non invasive risk stratification*: High *2012 Appropriate Use Criteria for Coronary Revascularization Focused Update: J Am Coll Cardiol. 3300;76(2):263-335. http://content.airportbarriers.com.aspx?articleid=[email or web address] Electronically Signed   By: Dorise Bullion III M.D   On: 01/06/2016 13:09    Subjective: Eager to go home  Discharge  Exam: Vitals:   01/06/16 1041 01/06/16 1450  BP:  (!) 138/98  Pulse: 100 92  Resp:  16  Temp:     Vitals:   01/06/16 1038 01/06/16 1040 01/06/16 1041 01/06/16 1450  BP: (!) 143/103 (!) 146/101  (!) 138/98  Pulse: 99 99 100 92  Resp:    16  Temp:      TempSrc:      SpO2:    99%  Weight:      Height:        General: Pt is alert, awake, not in acute distress Cardiovascular: RRR, S1/S2 +, no rubs, no gallops Respiratory: CTA bilaterally, no wheezing, no rhonchi Abdominal: Soft, NT, ND, bowel sounds + Extremities: no edema, no cyanosis   The results of significant diagnostics from this hospitalization (including imaging, microbiology, ancillary and laboratory) are listed below for reference.     Microbiology: No results found for this or any previous visit (from the past 240 hour(s)).   Labs: BNP (last 3 results)  Recent Labs  01/02/16 1240  BNP 4,562.5*   Basic Metabolic Panel:  Recent Labs Lab 01/02/16 1240 01/03/16 0546 01/04/16 0509 01/05/16 0557 01/06/16 0531  NA 138 140 142 142 143  K 4.2 4.0 3.6 3.4* 3.4*  CL 107 106 106 105 103  CO2 22 26 28 29 29   GLUCOSE 114* 90 89 88 90  BUN 70* 63* 57* 46* 42*  CREATININE 3.51* 3.05* 2.78* 2.37* 2.49*  CALCIUM 8.3* 8.1* 8.0* 8.0* 8.1*   Liver Function Tests:  Recent Labs Lab 01/02/16 1240  AST 60*  ALT 91*  ALKPHOS 57  BILITOT 0.9  PROT 5.0*  ALBUMIN 3.2*   No results for input(s): LIPASE, AMYLASE in the last 168 hours. No results for input(s): AMMONIA in the last 168 hours. CBC:  Recent Labs Lab 01/02/16 1240  WBC 10.3  HGB 10.2*  HCT 31.2*  MCV 72.2*  PLT 407*   Cardiac Enzymes: No results for input(s): CKTOTAL, CKMB, CKMBINDEX, TROPONINI in the last 168 hours. BNP: Invalid input(s): POCBNP CBG: No results for input(s): GLUCAP in the last 168 hours. D-Dimer No results for input(s): DDIMER in the last 72 hours. Hgb A1c No results for input(s): HGBA1C in the last 72 hours. Lipid  Profile No results for input(s): CHOL, HDL, LDLCALC, TRIG, CHOLHDL, LDLDIRECT in the last 72 hours. Thyroid function studies No results for input(s): TSH, T4TOTAL, T3FREE, THYROIDAB in the last 72 hours.  Invalid input(s): FREET3 Anemia work up No results for input(s): VITAMINB12, FOLATE, FERRITIN, TIBC, IRON, RETICCTPCT in the last 72 hours. Urinalysis    Component Value Date/Time   COLORURINE YELLOW 01/02/2016 1635   APPEARANCEUR CLEAR 01/02/2016 1635   LABSPEC 1.015 01/02/2016 1635   PHURINE 5.0 01/02/2016 1635   GLUCOSEU NEGATIVE 01/02/2016 1635   HGBUR NEGATIVE 01/02/2016 1635   BILIRUBINUR NEGATIVE 01/02/2016 1635   [place] 01/02/2016 1635   PROTEINUR 30 (A) 01/02/2016 1635   NITRITE NEGATIVE 01/02/2016 1635   LEUKOCYTESUR NEGATIVE 01/02/2016 1635   Sepsis Labs Invalid input(s): PROCALCITONIN,  WBC,  LACTICIDVEN Microbiology No results found for this or any previous visit (from the past 240  hour(s)).   SIGNED:   Donne Hazel, MD  [facility] 01/06/2016, 4:06 PM  If 7PM-7AM, please contact night-coverage [email or web address] Password [email or web address]

## 2016-01-06 NOTE — Progress Notes (Signed)
Patient Name: Vernon Blackburn Date of Encounter: 01/06/2016  Primary Cardiologist: New - Dr. Sundra Aland Problem List     Principal Problem:   Acute systolic heart failure [facility]) Active Problems:   Edema   DOE (dyspnea on exertion)   CKD (chronic kidney disease)   Anemia, chronic disease   Accelerated hypertension   Acute renal failure superimposed on chronic kidney disease (HCC)   Pulmonary HTN   Non-rheumatic mitral regurgitation    Subjective   Breathing at baseline. No chest discomfort or palpitations. Has been NPO since midnight for stress test.   Inpatient Medications    Scheduled Meds: . carvedilol  6.25 mg Oral BID WC  . furosemide  60 mg Intravenous BID  . heparin  5,000 Units Subcutaneous Q8H  . hydrALAZINE  50 mg Oral Q8H  . isosorbide mononitrate  30 mg Oral Daily  . mometasone-formoterol  2 puff Inhalation BID  . sodium chloride flush  3 mL Intravenous Q12H   Continuous Infusions:  PRN Meds: sodium chloride, acetaminophen, albuterol, hydrALAZINE, ondansetron (ZOFRAN) IV, sodium chloride flush   Vital Signs    Vitals:   01/05/16 1839 01/05/16 1929 01/05/16 2119 01/06/16 0545  BP: 128/72  (!) 148/107 (!) 159/110  Pulse: 85  98 88  Resp: 18     Temp:   97.8 F (36.6 C) 98 F (36.7 C)  TempSrc:   Oral Oral  SpO2:  98% 100% 100%  Weight:    205 lb 9.6 oz (93.3 kg)  Height:        Intake/Output Summary (Last 24 hours) at 01/06/16 0730 Last data filed at 01/06/16 0600  Gross per 24 hour  Intake              120 ml  Output                0 ml  Net              120 ml   Filed Weights   01/04/16 0451 01/05/16 0540 01/06/16 0545  Weight: 234 lb (106.1 kg) 222 lb 1.6 oz (100.7 kg) 205 lb 9.6 oz (93.3 kg)    Physical Exam    GEN: Well nourished, well developed African American male appearing in no acute distress.  HEENT: Grossly normal.  Neck: Supple, no JVD, carotid bruits, or masses. Cardiac: RRR, no rubs, or gallops. 2/6 SEM at [place].  No clubbing or cyanosis. No lower extremity edema.  Radials/DP/PT 2+ and equal bilaterally.  Respiratory:  Respirations regular and unlabored, clear to auscultation bilaterally. GI: Soft, nontender, nondistended, BS + x 4. MS: no deformity or atrophy. Skin: warm and dry, no rash. Neuro:  Strength and sensation are intact. Psych: AAOx3.  Normal affect.  Labs    CBC No results for input(s): WBC, NEUTROABS, HGB, HCT, MCV, PLT in the last 72 hours. Basic Metabolic Panel  Recent Labs  01/05/16 0557 01/06/16 0531  NA 142 143  K 3.4* 3.4*  CL 105 103  CO2 29 29  GLUCOSE 88 90  BUN 46* 42*  CREATININE 2.37* 2.49*  CALCIUM 8.0* 8.1*   Liver Function Tests No results for input(s): AST, ALT, ALKPHOS, BILITOT, PROT, ALBUMIN in the last 72 hours. No results for input(s): LIPASE, AMYLASE in the last 72 hours. Cardiac Enzymes No results for input(s): CKTOTAL, CKMB, CKMBINDEX, TROPONINI in the last 72 hours. BNP Invalid input(s): POCBNP D-Dimer No results for input(s): DDIMER in the last 72 hours. Hemoglobin A1C  No results for input(s): HGBA1C in the last 72 hours. Fasting Lipid Panel No results for input(s): CHOL, HDL, LDLCALC, TRIG, CHOLHDL, LDLDIRECT in the last 72 hours. Thyroid Function Tests No results for input(s): TSH, T4TOTAL, T3FREE, THYROIDAB in the last 72 hours.  Invalid input(s): FREET3  Telemetry    NSR, HR in 80's - 90's. Frequent PVC's.  - Personally Reviewed  ECG    No new tracings.   Radiology    No results found.  Cardiac Studies   Echocardiogram: 01/03/2016 Study Conclusions  - Left ventricle: The cavity size was mildly dilated. There was mild concentric hypertrophy. Systolic function was severely reduced. The estimated ejection fraction was in the range of 20% to 25%. Wall motion was normal; there were no regional wall motion abnormalities. - Aortic root: The aortic root was mildly dilated. - Mitral valve: There was severe regurgitation  directed eccentrically and toward the free wall. - Left atrium: The atrium was severely dilated. - Right atrium: The atrium was mildly to moderately dilated. - Tricuspid valve: There was moderate regurgitation. - Pulmonary arteries: Systolic pressure was moderately increased. PA peak pressure: 52 mm Hg (S).  Patient Profile     51 y.o. male w/ PMH of Stage 3-4 CKD, HTN, asthma, and history of gastric bypass in 2014 who presented to [facility] ED on 01/02/2016 for worsening dyspnea with exertion and lower extremity edema. Echo shows newly reduced EF of 20-25% with severe MR.    Assessment & Plan    1. Acute Systolic CHF/ New Cardiomyopathy - presented with worsening dyspnea with exertion, orthopnea, abdominal distention and lower extremity edema for the past month. Weight went up 20lbs from baseline.   - BNP at 1225 with echocardiogram showing a reduced EF of 20-25% with no regional WMA, severe MR, severely dilated LA and mildly to moderately dilated RA. Last echo and NST in 2013 were "normal" according to the patient. Can see where these were performed in care Everywhere but results are not available for review.  - remains on IV Lasix with a net output of -4.0L recorded but weight down from 240 --> 205 lbs. (doubt accuracy of 205 lb weight, as he was 222 lbs on 1/3 - obtain standing weight later today).  Lasix dosing increased from 40mg  BID to 60mg  BID on 1/2. Can likely switch to PO Lasix 40mg  BID today or tomorrow as volume status is close to baseline. - started on Coreg. Continue Hydralazine and Imdur. No ACE-I/ARB secondary to his CKD.  - for Lexiscan Myoview later this morning. Has been NPO since midnight.   2. Severe MR - echo on 01/03/2015 showed severe regurgitation directed eccentrically and toward the free wall with a peak gradient (D) of 4 mm Hg. His murmur does not sound severe on exam.  - would need repeat limited echo to assess valve function once acute HF resolved.   3.  Stage 3-4 CKD - creatinine 1.84 in 02/2015. Elevated to 3.51 on admission, improved to 2.49 this AM.  - followed by Dr. [name] as an outpatient.   4. HTN - BP at 122/72 - 159/110 in the past 24 hours.  - started on Coreg 6.25mg  BID. Will increase to 12.5mg  BID today (hold prior to stress test). Continue Hydralazine and Imdur.    Signed, Erma Heritage, PA  01/06/2016, 7:30 AM

## 2016-01-13 ENCOUNTER — Encounter: Payer: Self-pay | Admitting: Cardiology

## 2016-01-13 ENCOUNTER — Ambulatory Visit (INDEPENDENT_AMBULATORY_CARE_PROVIDER_SITE_OTHER): Payer: Self-pay | Admitting: Cardiology

## 2016-01-13 VITALS — BP 120/90 | HR 88 | Ht 70.0 in | Wt 221.0 lb

## 2016-01-13 DIAGNOSIS — I10 Essential (primary) hypertension: Secondary | ICD-10-CM

## 2016-01-13 DIAGNOSIS — I340 Nonrheumatic mitral (valve) insufficiency: Secondary | ICD-10-CM

## 2016-01-13 DIAGNOSIS — I5022 Chronic systolic (congestive) heart failure: Secondary | ICD-10-CM | POA: Insufficient documentation

## 2016-01-13 MED ORDER — CARVEDILOL 25 MG PO TABS: 25.0000 mg | ORAL_TABLET | Freq: Two times a day (BID) | ORAL | 3 refills | Status: DC

## 2016-01-13 NOTE — Patient Instructions (Addendum)
Medication Instructions:  1. INCREASE COREG TO 25 MG TWICE DAILY; NEW RX HAS BEEN SENT IN  Labwork: PLEASE HAVE NEPHROLOGY CHECK A BMET TODAY AT YOUR VISIT; PLEASE FAX RESULTS TO Ellen Henri, [place]  Testing/Procedures: 1. Your physician has requested that you have an LIMITED echocardiogram. Echocardiography is a painless test that uses sound waves to create images of your heart. It provides your doctor with information about the size and shape of your heart and how well your heart's chambers and valves are working. This procedure takes approximately one hour. There are no restrictions for this procedure.    Follow-Up: 1. 6-8 WEEKS WITH DR. Radford Pax  2. 2-3 WEEKS WITH THE BLOOD PRESSURE CLINIC  Any Other Special Instructions Will Be Listed Below (If Applicable). If you need a refill on your cardiac medications before your next appointment, please call your pharmacy.   Low-Sodium Eating Plan Sodium raises blood pressure and causes water to be held in the body. Getting less sodium from food will help lower your blood pressure, reduce any swelling, and protect your heart, liver, and kidneys. We get sodium by adding salt (sodium chloride) to food. Most of our sodium comes from canned, boxed, and frozen foods. Restaurant foods, fast foods, and pizza are also very high in sodium. Even if you take medicine to lower your blood pressure or to reduce fluid in your body, getting less sodium from your food is important. What is my plan? Most people should limit their sodium intake to 2,300 mg a day. Your health care provider recommends that you limit your sodium intake to __________ a day. What do I need to know about this eating plan? For the low-sodium eating plan, you will follow these general guidelines:  Choose foods with a % Daily Value for sodium of less than 5% (as listed on the food label).  Use salt-free seasonings or herbs instead of table salt or sea salt.  Check with your  health care provider or pharmacist before using salt substitutes.  Eat fresh foods.  Eat more vegetables and fruits.  Limit canned vegetables. If you do use them, rinse them well to decrease the sodium.  Limit cheese to 1 oz (28 g) per day.  Eat lower-sodium products, often labeled as "lower sodium" or "no salt added."  Avoid foods that contain monosodium glutamate (MSG). MSG is sometimes added to [place] food and some canned foods.  Check food labels (Nutrition Facts labels) on foods to learn how much sodium is in one serving.  Eat more home-cooked food and less restaurant, buffet, and fast food.  When eating at a restaurant, ask that your food be prepared with less salt, or no salt if possible. How do I read food labels for sodium information? The Nutrition Facts label lists the amount of sodium in one serving of the food. If you eat more than one serving, you must multiply the listed amount of sodium by the number of servings. Food labels may also identify foods as:  Sodium free-Less than 5 mg in a serving.  Very low sodium-35 mg or less in a serving.  Low sodium-140 mg or less in a serving.  Light in sodium-50% less sodium in a serving. For example, if a food that usually has 300 mg of sodium is changed to become light in sodium, it will have 150 mg of sodium.  Reduced sodium-25% less sodium in a serving. For example, if a food that usually has 400 mg of sodium is changed to  reduced sodium, it will have 300 mg of sodium. What foods can I eat? Grains  Low-sodium cereals, including oats, puffed wheat and rice, and shredded wheat cereals. Low-sodium crackers. Unsalted rice and pasta. Lower-sodium bread. Vegetables  Frozen or fresh vegetables. Low-sodium or reduced-sodium canned vegetables. Low-sodium or reduced-sodium tomato sauce and paste. Low-sodium or reduced-sodium tomato and vegetable juices. Fruits  Fresh, frozen, and canned fruit. Fruit juice. Meat and Other Protein  Products  Low-sodium canned tuna and salmon. Fresh or frozen meat, poultry, seafood, and fish. Lamb. Unsalted nuts. Dried beans, peas, and lentils without added salt. Unsalted canned beans. Homemade soups without salt. Eggs. Dairy  Milk. Soy milk. Ricotta cheese. Low-sodium or reduced-sodium cheeses. Yogurt. Condiments  Fresh and dried herbs and spices. Salt-free seasonings. Onion and garlic powders. Low-sodium varieties of mustard and ketchup. Fresh or refrigerated horseradish. Lemon juice. Fats and Oils  Reduced-sodium salad dressings. Unsalted butter. Other  Unsalted popcorn and pretzels. The items listed above may not be a complete list of recommended foods or beverages. Contact your dietitian for more options.  What foods are not recommended? Grains  Instant hot cereals. Bread stuffing, pancake, and biscuit mixes. Croutons. Seasoned rice or pasta mixes. Noodle soup cups. Boxed or frozen macaroni and cheese. Self-rising flour. Regular salted crackers. Vegetables  Regular canned vegetables. Regular canned tomato sauce and paste. Regular tomato and vegetable juices. Frozen vegetables in sauces. Salted [place] fries. Olives. Angie Fava. Relishes. Sauerkraut. Salsa. Meat and Other Protein Products  Salted, canned, smoked, spiced, or pickled meats, seafood, or fish. Bacon, ham, sausage, hot dogs, corned beef, chipped beef, and packaged luncheon meats. Salt pork. Jerky. Pickled herring. Anchovies, regular canned tuna, and sardines. Salted nuts. Dairy  Processed cheese and cheese spreads. Cheese curds. Blue cheese and cottage cheese. Buttermilk. Condiments  Onion and garlic salt, seasoned salt, table salt, and sea salt. Canned and packaged gravies. Worcestershire sauce. Tartar sauce. Barbecue sauce. Teriyaki sauce. Soy sauce, including reduced sodium. Steak sauce. Fish sauce. Oyster sauce. Cocktail sauce. Horseradish that you find on the shelf. Regular ketchup and mustard. Meat flavorings and  tenderizers. Bouillon cubes. Hot sauce. Tabasco sauce. Marinades. Taco seasonings. Relishes. Fats and Oils  Regular salad dressings. Salted butter. Margarine. Ghee. Bacon fat. Other  Potato and tortilla chips. Corn chips and puffs. Salted popcorn and pretzels. Canned or dried soups. Pizza. Frozen entrees and pot pies. The items listed above may not be a complete list of foods and beverages to avoid. Contact your dietitian for more information.  This information is not intended to replace advice given to you by your health care provider. Make sure you discuss any questions you have with your health care provider. Document Released: 06/10/2001 Document Revised: 05/27/2015 Document Reviewed: 10/23/2012 Elsevier Interactive Patient Education  2017 [employer].

## 2016-01-13 NOTE — Progress Notes (Signed)
01/13/2016 [name]   07-25-1965  [id number]  Primary Physician [name], MD Primary Cardiologist: Dr. Radford Pax    Reason for Visit/CC: [facility] f/u for Acute Systolic CHF/ Cardiomyopathy  HPI:  [name] presents to clinic for post hospital f/u. He is a 51 y.o.male w/ PMH of Stage 3-4 CKD, HTN, asthma, and history of gastric bypass in 2014 who presented to [facility] ED on 01/02/2016 for worsening dyspnea with exertion and lower extremity edema. Echo showed newly reduced EF of 20-25% with severe MR.  A NST was obtained on 01/05/16 that showed no ischemia. It was suspected that he had DCM secondary to poorly controlled longstanding HTN. He was treated with IV diuretics back to euvolemic state. His symptoms improved. He was transitioned to PO diuretics, 40 mg of Lasix BID. Also treated with Coreg, hydralazine and Imdur. No ACE-ARB given CKD. It was recommended that he undergo a repeat limited 2D echo once his HF improved to reassess his mitral valve.   Today in f/u, he reports that he has done well. He denies any recurrent dyspnea. No exertional dyspnea or chest pain. No orthopnea or PND. He has mild LEE bilaterally but wears compression stockings. He has been fully compliant with daily weights and denies any significant changes in weight. He avoids salt. He has been fully compliant with his meds and tolerating well w/ the exception of a slight headache after taking Imdur, however this usually resolves after an hour or so. He is scheduled to see his nephrologist, Dr. [name], later this morning.    Current Meds  Medication Sig  . albuterol (PROVENTIL HFA;VENTOLIN HFA) 108 (90 Base) MCG/ACT inhaler Inhale 2 puffs into the lungs every 6 (six) hours as needed for wheezing or shortness of breath.  Marland Kitchen albuterol (PROVENTIL) (5 MG/ML) 0.5% nebulizer solution Take 2.5 mg by nebulization every 6 (six) hours as needed for wheezing or shortness of breath.  . carvedilol (COREG) 12.5 MG  tablet Take 1 tablet (12.5 mg total) by mouth 2 (two) times daily with a meal.  . Fluticasone-Salmeterol (ADVAIR) 500-50 MCG/DOSE AEPB Inhale 1 puff into the lungs 2 (two) times daily.  . furosemide (LASIX) 40 MG tablet Take 1 tablet (40 mg total) by mouth 2 (two) times daily.  . hydrALAZINE (APRESOLINE) 50 MG tablet Take 1 tablet (50 mg total) by mouth every 8 (eight) hours.  . isosorbide mononitrate (IMDUR) 30 MG 24 hr tablet Take 1 tablet (30 mg total) by mouth daily.   No Known Allergies Past Medical History:  Diagnosis Date  . Arthritis    Gout  . Asthma   . CHF (congestive heart failure) ([place])    a. 01/2016: echo showing EF of 20-25%, no WMA, severe MR, and PA Peak Pressure of 52 mm Hg.   Marland Kitchen Elbow pain, left   . Knee pain, bilateral   . Left ankle pain 11/04/2010   Gout  . Leg pain, bilateral   . Peripheral edema    Bilateral lower extremity   . Venous insufficiency    Family History  Problem Relation Age of Onset  . Diabetes Mother   . Hypertension Mother   . Heart failure Mother    Past Surgical History:  Procedure Laterality Date  . GASTRIC BYPASS  2014  . MENISCUS REPAIR  05/2008   right    Social History   Social History  . Marital status: Married    Spouse name: N/A  . Number of children: N/A  .  Years of education: N/A   Occupational History  . Not on file.   Social History Main Topics  . Smoking status: Never Smoker  . Smokeless tobacco: Never Used  . Alcohol use No  . Drug use: No  . Sexual activity: Not on file   Other Topics Concern  . Not on file   Social History Narrative  . No narrative on file     Review of Systems: General: negative for chills, fever, night sweats or weight changes.  Cardiovascular: negative for chest pain, dyspnea on exertion, edema, orthopnea, palpitations, paroxysmal nocturnal dyspnea or shortness of breath Dermatological: negative for rash Respiratory: negative for cough or wheezing Urologic: negative for  hematuria Abdominal: negative for nausea, vomiting, diarrhea, bright red blood per rectum, melena, or hematemesis Neurologic: negative for visual changes, syncope, or dizziness All other systems reviewed and are otherwise negative except as noted above.   Physical Exam:  Blood pressure 120/90, pulse 88, height 5\' 10"  (1.778 m), weight 221 lb (100.2 kg).  General appearance: alert, cooperative and no distress Neck: no carotid bruit and no JVD Lungs: clear to auscultation bilaterally Heart: regular rate and rhythm, S1, S2 normal, no murmur, click, rub or gallop Extremities: extremities normal, atraumatic, no cyanosis or edema Pulses: 2+ and symmetric Skin: Skin color, texture, turgor normal. No rashes or lesions Neurologic: Grossly normal  EKG not performed   ASSESSMENT AND PLAN:   1. Acute Systolic CHF/ New Cardiomyopathy - recent admission for new onset systolic HF. He presented with worsening dyspnea with exertion, orthopnea, abdominal distentionand lower extremity edema for the past month. Weight went up 20lbs from baseline.  - BNP at 1225 withechocardiogram showinga reduced EF of 20-25% with no regional WMA, severe MR, severely dilated LA and mildly to moderately dilated RA. Last echo and NST in 2013 were "normal" according to the patient.  - started on Coreg. ContinueHydralazine and Imdur. NoACE-I/ARB secondary to his CKD.  Carlton Adam Myoview 01/05/16 showed no ischemia and he denies CP. - suspect DCM secondary to poorly controlled longstanding HTN. - he is euvolemic on physical exam today. He denies dyspnea, orthopnea, PND and weight gain. - continue Lasix, 40 mg BID.  -check BMP today. - continue HF meds.  - Low Sodium diet and daily weights.   2. Severe MR - echo on 01/03/2015 showed severe regurgitation directed eccentrically and toward the free wall with a peak gradient (D) of 4 mm Hg. His murmur does not sound severe on exam. He no longer reports any symptoms. - would  need repeat limited echo to assess valve function once acute HF resolved. Will schedule.   3. Stage 3-4 CKD - creatinine 1.84 in 02/2015. Elevated to 3.51 on admission, improved to 2.49 by discharge - followed by Dr. [name] as an outpatient. Scheduled to see him today.  - we will obtain a f/u BMP today.   4. HTN - BP at 120/90. Elevated diastolic BP. HR in the upper 80s - Increase Coreg to 25 mg BID.  Continue Hydralazine and Imdur. No ACE/ARB given CKD.    PLAN  F/u in HTN clinic in 2-3 weeks for repeat BP check and further titration of BP meds. Repeat Limited echo to reassess status of his mitral valve. F/u with Dr. Radford Pax in 6-8 weeks.   Lyda Jester PA-C 01/13/2016 8:19 AM

## 2016-01-28 ENCOUNTER — Other Ambulatory Visit (HOSPITAL_COMMUNITY): Payer: Self-pay

## 2016-02-03 ENCOUNTER — Ambulatory Visit: Payer: Self-pay

## 2016-02-04 ENCOUNTER — Ambulatory Visit (INDEPENDENT_AMBULATORY_CARE_PROVIDER_SITE_OTHER): Payer: Self-pay | Admitting: Pharmacist

## 2016-02-04 ENCOUNTER — Other Ambulatory Visit (HOSPITAL_COMMUNITY): Payer: Self-pay | Admitting: *Deleted

## 2016-02-04 ENCOUNTER — Ambulatory Visit (HOSPITAL_COMMUNITY): Payer: Medicaid Other | Attending: Cardiovascular Disease

## 2016-02-04 ENCOUNTER — Other Ambulatory Visit: Payer: Self-pay

## 2016-02-04 VITALS — BP 146/102 | HR 97

## 2016-02-04 DIAGNOSIS — I340 Nonrheumatic mitral (valve) insufficiency: Secondary | ICD-10-CM

## 2016-02-04 DIAGNOSIS — I10 Essential (primary) hypertension: Secondary | ICD-10-CM

## 2016-02-04 DIAGNOSIS — I110 Hypertensive heart disease with heart failure: Secondary | ICD-10-CM | POA: Diagnosis not present

## 2016-02-04 DIAGNOSIS — I509 Heart failure, unspecified: Secondary | ICD-10-CM | POA: Diagnosis not present

## 2016-02-04 DIAGNOSIS — I081 Rheumatic disorders of both mitral and tricuspid valves: Secondary | ICD-10-CM | POA: Diagnosis not present

## 2016-02-04 MED ORDER — VALSARTAN 80 MG PO TABS: 80.0000 mg | ORAL_TABLET | Freq: Every day | ORAL | 11 refills | Status: DC

## 2016-02-04 NOTE — Patient Instructions (Addendum)
Start taking valsartan 80mg  once a day. Pick up your refill of carvedilol 25mg  to take twice a day.  Follow up lab work and blood pressure check in 1 week - Friday, February 9th at 9:30am.

## 2016-02-04 NOTE — Progress Notes (Signed)
Patient ID: Vernon Blackburn                 DOB: January 23, 1965                      MRN: [id number]     HPI: Vernon Blackburn is a 51 y.o. male patient of Vernon Blackburn referred to HTN clinic by Vernon Jester, Vernon Blackburn. PMH is significant for systolic HF with LVEF 22-29%, stage 3-4 CKD, HTN, and gastric bypass. Pt has a f/u echo today as well as a HTN follow up.  Pt reports adherence to his medications except that he ran out of carvedilol 3 days ago and is planning on picking it up today - HR is up to 97 and he felt his heart racing a bit which is expected with abrupt discontinuation. Denies headache, dizziness, and blurred vision. Vernon Blackburn is his nephrologist - he has an upcoming iron infusion next week. Has been trying to eat better but has not had time to exercise - he is in law school, is in the process of moving, and is going through a separation.  Current HTN meds: carvedilol 25mg  BID, furosemide 40mg  BID, hydralazine 50mg  TID, Imdur 30mg  daily Previously tried: lisinopril - cough BP goal: <130/80mmHg  Family History: DM, HTN, and HF in his mother.  Social History: Denies alcohol and tobacco use.  Diet: Minimal salt intake. 1-2 small cups of coffee per day. Has been working to improve his diet overall.  Exercise: Minimal currently d/t transition period in his life, is planning to resume in the future.   Wt Readings from Last 3 Encounters:  01/13/16 221 lb (100.2 kg)  01/06/16 205 lb 9.6 oz (93.3 kg)  11/15/11 (!) 357 lb (161.9 kg)   BP Readings from Last 3 Encounters:  01/13/16 120/90  01/06/16 (!) 150/107  01/06/16 (!) 138/98   Pulse Readings from Last 3 Encounters:  01/13/16 88  01/06/16 100  01/06/16 92    Renal function: CrCl cannot be calculated (Patient's most recent lab result is older than the maximum 21 days allowed.).  Past Medical History:  Diagnosis Date  . Arthritis    Gout  . Asthma   . CHF (congestive heart failure) ([place])    a. 01/2016: echo showing EF of 20-25%,  no WMA, severe MR, and Vernon Blackburn Peak Pressure of 52 mm Hg.   Marland Kitchen Elbow pain, left   . Knee pain, bilateral   . Left ankle pain 11/04/2010   Gout  . Leg pain, bilateral   . Peripheral edema    Bilateral lower extremity   . Venous insufficiency     Current Outpatient Prescriptions on File Prior to Visit  Medication Sig Dispense Refill  . albuterol (PROVENTIL HFA;VENTOLIN HFA) 108 (90 Base) MCG/ACT inhaler Inhale 2 puffs into the lungs every 6 (six) hours as needed for wheezing or shortness of breath.    Marland Kitchen albuterol (PROVENTIL) (5 MG/ML) 0.5% nebulizer solution Take 2.5 mg by nebulization every 6 (six) hours as needed for wheezing or shortness of breath.    . carvedilol (COREG) 25 MG tablet Take 1 tablet (25 mg total) by mouth 2 (two) times daily. 180 tablet 3  . Fluticasone-Salmeterol (ADVAIR) 500-50 MCG/DOSE AEPB Inhale 1 puff into the lungs 2 (two) times daily.    . furosemide (LASIX) 40 MG tablet Take 1 tablet (40 mg total) by mouth 2 (two) times daily. 60 tablet 0  . hydrALAZINE (APRESOLINE) 50 MG tablet Take  1 tablet (50 mg total) by mouth every 8 (eight) hours. 90 tablet 0  . isosorbide mononitrate (IMDUR) 30 MG 24 hr tablet Take 1 tablet (30 mg total) by mouth daily. 30 tablet 0   No current facility-administered medications on file prior to visit.     No Known Allergies   Assessment/Plan:  1. Hypertension - BP elevated today < 130/6mmHg, likely because pt ran out of his carvedilol 3 days ago. He will pick up his refill today. Will also start valsartan 80mg  daily since pt has multiple indications for an ACEi/ARB (CKD, HTN, and HFrEF) and SCr relatively stable at 2.5. F/u BMET and BP check in 1 week.   Vernon Blackburn Vernon Blackburn, PharmD, CPP, [facility] [address] [place], [place] [address] Phone: [phone]; Fax: [phone] 02/04/2016 10:46 AM

## 2016-02-07 ENCOUNTER — Other Ambulatory Visit: Payer: Self-pay | Admitting: *Deleted

## 2016-02-07 ENCOUNTER — Ambulatory Visit (HOSPITAL_COMMUNITY)
Admission: RE | Admit: 2016-02-07 | Discharge: 2016-02-07 | Disposition: A | Discharge status: Home / Self Care | Payer: Medicaid Other | Source: Ambulatory Visit | Attending: Nephrology | Admitting: Nephrology

## 2016-02-07 DIAGNOSIS — D509 Iron deficiency anemia, unspecified: Secondary | ICD-10-CM | POA: Diagnosis not present

## 2016-02-07 MED ORDER — FUROSEMIDE 40 MG PO TABS: 40.0000 mg | ORAL_TABLET | Freq: Two times a day (BID) | ORAL | 10 refills | Status: DC

## 2016-02-07 MED ORDER — FERUMOXYTOL INJECTION 510 MG/17 ML
510.0000 mg | INTRAVENOUS | Status: DC
  Administered 2016-02-07: 510 mg via INTRAVENOUS
  Filled 2016-02-07: qty 17

## 2016-02-07 NOTE — Discharge Instructions (Signed)
Ferumoxytol injection What is this medicine? FERUMOXYTOL is an iron complex. Iron is used to make healthy red blood cells, which carry oxygen and nutrients throughout the body. This medicine is used to treat iron deficiency anemia in people with chronic kidney disease. COMMON BRAND NAME(S): Feraheme What should I tell my health care provider before I take this medicine? They need to know if you have any of these conditions: -anemia not caused by low iron levels -high levels of iron in the blood -magnetic resonance imaging (MRI) test scheduled -an unusual or allergic reaction to iron, other medicines, foods, dyes, or preservatives -pregnant or trying to get pregnant -breast-feeding How should I use this medicine? This medicine is for injection into a vein. It is given by a health care professional in a hospital or clinic setting. Talk to your pediatrician regarding the use of this medicine in children. Special care may be needed. What if I miss a dose? It is important not to miss your dose. Call your doctor or health care professional if you are unable to keep an appointment. What may interact with this medicine? This medicine may interact with the following medications: -other iron products What should I watch for while using this medicine? Visit your doctor or healthcare professional regularly. Tell your doctor or healthcare professional if your symptoms do not start to get better or if they get worse. You may need blood work done while you are taking this medicine. You may need to follow a special diet. Talk to your doctor. Foods that contain iron include: whole grains/cereals, dried fruits, beans, or peas, leafy green vegetables, and organ meats (liver, kidney). What side effects may I notice from receiving this medicine? Side effects that you should report to your doctor or health care professional as soon as possible: -allergic reactions like skin rash, itching or hives, swelling of the  face, lips, or tongue -breathing problems -changes in blood pressure -feeling faint or lightheaded, falls -fever or chills -flushing, sweating, or hot feelings -swelling of the ankles or feet Side effects that usually do not require medical attention (report to your doctor or health care professional if they continue or are bothersome): -diarrhea -headache -nausea, vomiting -stomach pain Where should I keep my medicine? This drug is given in a hospital or clinic and will not be stored at home.  2017 Elsevier/Gold Standard (2015-01-21 12:41:49)  

## 2016-02-11 ENCOUNTER — Other Ambulatory Visit: Payer: Self-pay

## 2016-02-11 ENCOUNTER — Ambulatory Visit: Payer: Self-pay | Admitting: Pharmacist

## 2016-02-11 NOTE — Progress Notes (Deleted)
Patient ID: Vernon Blackburn                 DOB: 1965/01/10                      MRN: [id number]     HPI: Vernon Blackburn is a 51 y.o. male patient of Dr Radford Pax referred to HTN clinic by Lyda Jester, PA. PMH is significant for systolic HF with LVEF 22-29%, stage 3-4 CKD, HTN, and gastric bypass. At last visit, pt had run out of his carvedilol for 3 days. He was encouraged to restart therapy and was started on valsartan due to multiple indications for ACEi/ARB therapy (CKD, HTN, and HFrEF) with previous cough to lisinopril. Pt presents today for BMET and BP check.  Adherence? If bmet stable, increase valsartan or hydralazine if needed  Current HTN meds: carvedilol 25mg  BID, furosemide 40mg  BID, hydralazine 50mg  TID, Imdur 30mg  daily, valsartan 80mg  daily Previously tried: lisinopril - cough BP goal: <130/55mmHg  Family History: DM, HTN, and HF in his mother.  Social History: Denies alcohol and tobacco use.  Diet: Minimal salt intake. 1-2 small cups of coffee per day. Has been working to improve his diet overall.  Exercise: Has not had time to exercise - he is in law school, is in the process of moving, and is going through a separation. Is planning to resume in the future.   Wt Readings from Last 3 Encounters:  02/07/16 230 lb (104.3 kg)  01/13/16 221 lb (100.2 kg)  01/06/16 205 lb 9.6 oz (93.3 kg)   BP Readings from Last 3 Encounters:  02/07/16 128/83  02/04/16 (!) 146/102  01/13/16 120/90   Pulse Readings from Last 3 Encounters:  02/07/16 75  02/04/16 97  01/13/16 88    Renal function: CrCl cannot be calculated (Patient's most recent lab result is older than the maximum 21 days allowed.).  Past Medical History:  Diagnosis Date  . Arthritis    Gout  . Asthma   . CHF (congestive heart failure) ([place])    a. 01/2016: echo showing EF of 20-25%, no WMA, severe MR, and PA Peak Pressure of 52 mm Hg.   Marland Kitchen Elbow pain, left   . Knee pain, bilateral   . Left ankle pain  11/04/2010   Gout  . Leg pain, bilateral   . Peripheral edema    Bilateral lower extremity   . Venous insufficiency     Current Outpatient Prescriptions on File Prior to Visit  Medication Sig Dispense Refill  . albuterol (PROVENTIL HFA;VENTOLIN HFA) 108 (90 Base) MCG/ACT inhaler Inhale 2 puffs into the lungs every 6 (six) hours as needed for wheezing or shortness of breath.    Marland Kitchen albuterol (PROVENTIL) (5 MG/ML) 0.5% nebulizer solution Take 2.5 mg by nebulization every 6 (six) hours as needed for wheezing or shortness of breath.    . carvedilol (COREG) 25 MG tablet Take 1 tablet (25 mg total) by mouth 2 (two) times daily. 180 tablet 3  . Fluticasone-Salmeterol (ADVAIR) 500-50 MCG/DOSE AEPB Inhale 1 puff into the lungs 2 (two) times daily.    . furosemide (LASIX) 40 MG tablet Take 1 tablet (40 mg total) by mouth 2 (two) times daily. 60 tablet 10  . hydrALAZINE (APRESOLINE) 50 MG tablet Take 1 tablet (50 mg total) by mouth every 8 (eight) hours. 90 tablet 0  . isosorbide mononitrate (IMDUR) 30 MG 24 hr tablet Take 1 tablet (30 mg total) by mouth daily.  30 tablet 0  . valsartan (DIOVAN) 80 MG tablet Take 1 tablet (80 mg total) by mouth daily. 30 tablet 11   No current facility-administered medications on file prior to visit.     No Known Allergies   Assessment/Plan:

## 2016-02-14 ENCOUNTER — Ambulatory Visit (HOSPITAL_COMMUNITY): Admission: RE | Admit: 2016-02-14 | Payer: Self-pay | Source: Ambulatory Visit

## 2016-02-18 ENCOUNTER — Encounter: Payer: Self-pay | Admitting: Cardiology

## 2016-02-27 DIAGNOSIS — I130 Hypertensive heart and chronic kidney disease with heart failure and stage 1 through stage 4 chronic kidney disease, or unspecified chronic kidney disease: Secondary | ICD-10-CM | POA: Insufficient documentation

## 2016-02-27 NOTE — Progress Notes (Signed)
Cardiology Office Note    Date:  02/28/2016   ID:  Vernon Blackburn, DOB [birth year], MRN [id number]  PCP:  [name], MD  Cardiologist:  Fransico Him, MD   Chief Complaint  Patient presents with  . Congestive Heart Failure  . Cardiomyopathy  . Mitral Regurgitation  . Hypertension    History of Present Illness:  Vernon Blackburn is a 51 y.o. male w/ PMH of Stage 3-4 CKD, HTN, asthma, and history of gastric bypass in 2014 who presented to [facility] ED on 01/02/2016 for worsening dyspnea with exertion and lower extremity edema. Echo showed newly reduced EF of 20-25% with severe MR. A NST was obtained on 01/05/16 that showed no ischemia. It was suspected that he had DCM secondary to poorly controlled longstanding HTN. He was treated with IV diuretics back to euvolemic state. His symptoms improved. He was transitioned to PO diuretics, 40 mg of Lasix BID. Also treated with Coreg, hydralazine and Imdur. No ACE-ARB given CKD. It was recommended that he undergo a repeat limited 2D echo once his HF improved to reassess his mitral valve.  He was seen in January by our extender and was feeling much better.  His Coreg was increased for better BP control. Repeat echo 02/04/2016 showed persistently reduced LVF with EF 25% with diffuse HK, severe MR, severe LAE, moderate RAE and moderate TR.  He was seen in HTN clinic on 02/04/2016 and started on Valsartan 80mg  daily as creatinine had remained stable around 2.5 and has history of CKD, HTN and HFrEF.    He is doing well today.  He denies any chest pain, SOB, DOE (except with climbing stairs and longer walks), LE edema, dizziness or syncope.  He has not PND or orthopnea.  He is tolerating his medications and states that he is compliant except for hydralazine that he ran out of a week ago.     Past Medical History:  Diagnosis Date  . Arthritis    Gout  . Asthma   . CHF (congestive heart failure) ([place])    a. 01/2016: echo showing EF of 20-25%, no WMA,  severe MR, and PA Peak Pressure of 52 mm Hg.   Marland Kitchen Elbow pain, left   . Knee pain, bilateral   . Left ankle pain 11/04/2010   Gout  . Leg pain, bilateral   . Peripheral edema    Bilateral lower extremity   . Venous insufficiency     Past Surgical History:  Procedure Laterality Date  . GASTRIC BYPASS  2014  . MENISCUS REPAIR  05/2008   right     Current Medications: Current Meds  Medication Sig  . colchicine 0.6 MG tablet Take 0.6 mg by mouth as directed.  . FeAspGl-FeFum-B12-FA-C-Succ Ac (FERREX 28 PO) Take 1 tablet by mouth 2 (two) times daily.  . Febuxostat (ULORIC) 80 MG TABS Take 1 tablet by mouth daily.  . finasteride (PROSCAR) 5 MG tablet Take 5 mg by mouth daily.  . fluticasone furoate-vilanterol (BREO ELLIPTA) 100-25 MCG/INH AEPB Inhale 1 puff into the lungs daily.  Marland Kitchen lisdexamfetamine (VYVANSE) 60 MG capsule Take 60 mg by mouth every morning.  . Saw Palmetto 1000 MG CAPS Take 1 capsule by mouth daily.    Allergies:   Patient has no known allergies.   Social History   Social History  . Marital status: Married    Spouse name: N/A  . Number of children: N/A  . Years of education: N/A   Social History  Main Topics  . Smoking status: Never Smoker  . Smokeless tobacco: Never Used  . Alcohol use No  . Drug use: No  . Sexual activity: Not Asked   Other Topics Concern  . None   Social History Narrative  . None     Family History:  The patient's family history includes Diabetes in his mother; Heart failure in his mother; Hypertension in his mother.   ROS:   Please see the history of present illness.    ROS All other systems reviewed and are negative.  No flowsheet data found.     PHYSICAL EXAM:   VS:  BP (!) 148/110   Pulse 75   Ht 5\' 9"  (1.753 m)   Wt 229 lb (103.9 kg)   SpO2 95%   BMI 33.82 kg/m    GEN: Well nourished, well developed, in no acute distress  HEENT: normal  Neck: no JVD, carotid bruits, or masses Cardiac: RRR; no murmurs, rubs, or  gallops. Trace LE edema.  Intact distal pulses bilaterally.  Respiratory:  Dry crackles at right base posterior and anteriorly GI: soft, nontender, nondistended, + BS MS: no deformity or atrophy  Skin: warm and dry, no rash Neuro:  Alert and Oriented x 3, Strength and sensation are intact Psych: euthymic mood, full affect  Wt Readings from Last 3 Encounters:  02/28/16 229 lb (103.9 kg)  02/07/16 230 lb (104.3 kg)  01/13/16 221 lb (100.2 kg)      Studies/Labs Reviewed:   EKG:  EKG is not ordered today.    Recent Labs: 01/02/2016: ALT 91; B Natriuretic Peptide 1,225.2; Hemoglobin 10.2; Platelets 407 01/06/2016: BUN 42; Creatinine, Ser 2.49; Potassium 3.4; Sodium 143   Lipid Panel No results found for: CHOL, TRIG, HDL, CHOLHDL, VLDL, LDLCALC, LDLDIRECT  Additional studies/ records that were reviewed today include:  none    ASSESSMENT:    1. Chronic systolic HF (heart failure) ([employer])   2. Dilated cardiomyopathy ([place])   3. Non-rheumatic mitral regurgitation   4. Pulmonary HTN   5. Benign essential HTN   6. Abnormal lung sounds      PLAN:  In order of problems listed above:  1. Chronic systolic CHF - he appears euvolemic on exam but his weight is up by 8 lbs since OV in January.    His LVF has not improved but his BP has yet to be adequately controlled since.  I would like to titrate his ARB if renal function allows. Once BP controlled then repeat echo. 2. DCM - felt to be hypertensive related.  EF remains 25% by recent echo but his BP is still not adequately controlled.  See above #1. 3. Severe MR secondary to LV enlargement and mitral annular enlargement.  Hopefully this will improve once BP under better control.  If still present on repeat echo will refer to Dr. Burt Knack to see if he is a candidate for mitral clip. 4. Pulmonary HTN by echo (PASP 51mmHg on echo) - likely secondary to venous pulmonary HTN from CHF and MR. 5. HTN - BP .  He will continue on Coreg 25mg  BID,  Hydralazine 50mg  TID, Imdur 30mg  daily and Valsartan. Unfortunately he has been out of his hydralazine for the past week.  I will refill Hydralazine today.  I would like to uptitrate his Valsartan to 160mg  daily.  He just had a BMET with Dr. [name] with creatinine .  He will followup in HTN clinic in 1 week for uptitration of meds.  I will see him back in 4 weeks and if BP better controlled then will repeat echo 1 month later.    6. Abnormal lung sounds - he has had a cough but no fever or chills.  I will get a PA and lat ches xray.    Medication Adjustments/Labs and Tests Ordered: Current medicines are reviewed at length with the patient today.  Concerns regarding medicines are outlined above.  Medication changes, Labs and Tests ordered today are listed in the Patient Instructions below.  Patient Instructions  Medication Instructions:  1) INCREASE VALSARTAN to 160 mg daily 2) INCREASE LASIX to 40 mg THREE TIMES DAILY for 3 days. Then decrease Lasix back to 40 mg TWICE DAILY.  Labwork: You have lab work scheduled THIS Friday. You do NOT need to be fasting for this lab work.  Testing/Procedures: None  Follow-Up: You have an appointment scheduled with Dr. Radford Pax Monday, March 26 at 9:45 AM.  Any Other Special Instructions Will Be Listed Below (If Applicable).     If you need a refill on your cardiac medications before your next appointment, please call your pharmacy.     Signed, Fransico Him, MD  02/28/2016 11:01 AM    [facility] [place], [place], [place]  [address] Phone: [phone]; Fax: [phone]

## 2016-02-28 ENCOUNTER — Telehealth: Payer: Self-pay

## 2016-02-28 ENCOUNTER — Ambulatory Visit (INDEPENDENT_AMBULATORY_CARE_PROVIDER_SITE_OTHER): Payer: Self-pay | Admitting: Cardiology

## 2016-02-28 ENCOUNTER — Encounter: Payer: Self-pay | Admitting: Cardiology

## 2016-02-28 VITALS — BP 148/110 | HR 75 | Ht 69.0 in | Wt 229.0 lb

## 2016-02-28 DIAGNOSIS — R0989 Other specified symptoms and signs involving the circulatory and respiratory systems: Secondary | ICD-10-CM

## 2016-02-28 DIAGNOSIS — I272 Pulmonary hypertension, unspecified: Secondary | ICD-10-CM

## 2016-02-28 DIAGNOSIS — I5022 Chronic systolic (congestive) heart failure: Secondary | ICD-10-CM

## 2016-02-28 DIAGNOSIS — I10 Essential (primary) hypertension: Secondary | ICD-10-CM

## 2016-02-28 DIAGNOSIS — I340 Nonrheumatic mitral (valve) insufficiency: Secondary | ICD-10-CM

## 2016-02-28 DIAGNOSIS — I420 Dilated cardiomyopathy: Secondary | ICD-10-CM

## 2016-02-28 MED ORDER — VALSARTAN 160 MG PO TABS: 160.0000 mg | ORAL_TABLET | Freq: Every day | ORAL | 11 refills | Status: DC

## 2016-02-28 NOTE — Patient Instructions (Addendum)
Medication Instructions:  1) INCREASE VALSARTAN to 160 mg daily 2) INCREASE LASIX to 40 mg THREE TIMES DAILY for 3 days. Then decrease Lasix back to 40 mg TWICE DAILY.  Labwork: You have lab work scheduled THIS Friday. You do NOT need to be fasting for this lab work.  Testing/Procedures: None  Follow-Up: You have an appointment scheduled with Dr. Radford Pax Monday, March 26 at 9:45 AM.  Any Other Special Instructions Will Be Listed Below (If Applicable).     If you need a refill on your cardiac medications before your next appointment, please call your pharmacy.

## 2016-02-28 NOTE — Telephone Encounter (Signed)
Patient will need to have [facility] Clinic OV Friday with lab work. He will also need a chest xray.  Left message for patient to call back to discuss.

## 2016-02-29 ENCOUNTER — Telehealth: Payer: Self-pay | Admitting: Cardiology

## 2016-02-29 NOTE — Telephone Encounter (Signed)
Please let patient know that I spoke with Dr. [name] and he is fine with uptitrating Valsartan as long as creatinine < 4.

## 2016-02-29 NOTE — Telephone Encounter (Signed)
Follow Up   Pt returning phone call, advised pt that Joellen Jersey will call back during lunch time.

## 2016-02-29 NOTE — Telephone Encounter (Signed)
Spoke with patient. He agrees with treatment plan.

## 2016-02-29 NOTE — Telephone Encounter (Signed)
Patient agrees to [facility]N Clinic OV this Friday.  He will get chest xray at [place] as well. He was grateful for call.

## 2016-03-03 ENCOUNTER — Encounter: Payer: Self-pay | Admitting: Pharmacist

## 2016-03-03 ENCOUNTER — Other Ambulatory Visit: Payer: Self-pay | Admitting: *Deleted

## 2016-03-03 ENCOUNTER — Telehealth: Payer: Self-pay | Admitting: Cardiology

## 2016-03-03 ENCOUNTER — Ambulatory Visit (INDEPENDENT_AMBULATORY_CARE_PROVIDER_SITE_OTHER): Payer: Self-pay | Admitting: Pharmacist

## 2016-03-03 ENCOUNTER — Ambulatory Visit
Admission: RE | Admit: 2016-03-03 | Discharge: 2016-03-03 | Disposition: A | Discharge status: Home / Self Care | Payer: No Typology Code available for payment source | Source: Ambulatory Visit | Attending: Cardiology | Admitting: Cardiology

## 2016-03-03 VITALS — BP 148/102 | HR 70

## 2016-03-03 DIAGNOSIS — R0989 Other specified symptoms and signs involving the circulatory and respiratory systems: Secondary | ICD-10-CM

## 2016-03-03 DIAGNOSIS — I10 Essential (primary) hypertension: Secondary | ICD-10-CM

## 2016-03-03 LAB — BASIC METABOLIC PANEL
BUN/Creatinine Ratio: 16 (ref 9–20)
BUN: 39 mg/dL — ABNORMAL HIGH (ref 6–24)
CO2: 26 mmol/L (ref 18–29)
Calcium: 8.5 mg/dL — ABNORMAL LOW (ref 8.7–10.2)
Chloride: 98 mmol/L (ref 96–106)
Creatinine, Ser: 2.39 mg/dL — ABNORMAL HIGH (ref 0.76–1.27)
GFR calc Af Amer: 35 mL/min/{1.73_m2} — ABNORMAL LOW (ref >59)
GFR calc non Af Amer: 31 mL/min/{1.73_m2} — ABNORMAL LOW (ref >59)
Glucose: 109 mg/dL — ABNORMAL HIGH (ref 65–99)
Potassium: 3.8 mmol/L (ref 3.5–5.2)
Sodium: 141 mmol/L (ref 134–144)

## 2016-03-03 NOTE — Patient Instructions (Signed)
We will call you about cost effective options.

## 2016-03-03 NOTE — Telephone Encounter (Signed)
Advised pt we were waiting on Dr. Radford Pax to review and interpret chest xray.  Pt verbalized understanding and was appreciative for call back.  Pt states his phone is dead and charger not currently working so he asked that someone call him Monday on his work number with those results.

## 2016-03-03 NOTE — Telephone Encounter (Signed)
New Message   Pt calling regarding the results of his chest X-Ray... Was told it should be ready by this evening. Requesting a call back

## 2016-03-03 NOTE — Progress Notes (Signed)
Patient ID: DEV DHONDT                 DOB: [birth year]                      MRN: [id number]     HPI: Vernon Blackburn is a 51 y.o. male patient of Dr. Radford Pax who presents today for hypertension evaluation. PMH includes Stage 3-4 CKD, HTN, asthma, and history of gastric bypass in 2014. In Dec 2017, Echo showed newly reduced EF of 20-25% with severe MR. At his most recent OV with Dr. Radford Pax his valsartan was increased to 160mg  and it was confirmed with Dr. [name] ok to increase.  Patient stated that Imdur was discontinued when valsartan was initiated.   Currently, he is not taking hydralazine due to cost, and hasn't been able to increase the valsartan from 80 mg to 160 mg due to cost. 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   Current HTN meds:  Carvedilol 25mg  BID Valsartan 160mg  daily- has been taking 80 mg daily due to cost concerns Hydralazine 50mg  Q8H- not taking due to cost concerns Furosemide 40mg  BID  Previously tried: lisinopril - cough  BP goal: <130/80   Family History: DM, HTN, and HF in his mother.  Social History: Denies alcohol and tobacco use.  Diet: Minimal salt intake. 1-2 small cups of coffee per day. Has been working to improve his diet overall.  Exercise: Minimal currently d/t transition period in his life, is planning to resume in the future.   Wt Readings from Last 3 Encounters:  02/28/16 229 lb (103.9 kg)  02/07/16 230 lb (104.3 kg)  01/13/16 221 lb (100.2 kg)   BP Readings from Last 3 Encounters:  03/03/16 (!) 148/102  02/28/16 (!) 148/110  02/07/16 128/83   Pulse Readings from Last 3 Encounters:  03/03/16 70  02/28/16 75  02/07/16 75    Renal function: Estimated Creatinine Clearance: 44.4 mL/min (by C-G formula based on SCr of 2.39 mg/dL (H)).  Past Medical History:  Diagnosis Date  . Arthritis    Gout  . Asthma   . CHF (congestive heart failure) ([place])    a. 01/2016: echo showing EF of 20-25%, no WMA, severe MR, and PA  Peak Pressure of 52 mm Hg.   Marland Kitchen Elbow pain, left   . Knee pain, bilateral   . Left ankle pain 11/04/2010   Gout  . Leg pain, bilateral   . Peripheral edema    Bilateral lower extremity   . Venous insufficiency     Current Outpatient Prescriptions on File Prior to Visit  Medication Sig Dispense Refill  . carvedilol (COREG) 25 MG tablet Take 1 tablet (25 mg total) by mouth 2 (two) times daily. 180 tablet 3  . furosemide (LASIX) 40 MG tablet Take 1 tablet (40 mg total) by mouth 2 (two) times daily. 60 tablet 10  . valsartan (DIOVAN) 160 MG tablet Take 1 tablet (160 mg total) by mouth daily. 30 tablet 11  . albuterol (PROVENTIL HFA;VENTOLIN HFA) 108 (90 Base) MCG/ACT inhaler Inhale 2 puffs into the lungs every 6 (six) hours as needed for wheezing or shortness of breath.    Marland Kitchen albuterol (PROVENTIL) (5 MG/ML) 0.5% nebulizer solution Take 2.5 mg by nebulization every 6 (six) hours as needed for wheezing or shortness of breath.    . colchicine 0.6 MG tablet Take 0.6 mg by mouth as directed.    . FeAspGl-FeFum-B12-FA-C-Succ Ac (  FERREX 28 PO) Take 1 tablet by mouth 2 (two) times daily.    . Febuxostat (ULORIC) 80 MG TABS Take 1 tablet by mouth daily.    . finasteride (PROSCAR) 5 MG tablet Take 5 mg by mouth daily.    . fluticasone furoate-vilanterol (BREO ELLIPTA) 100-25 MCG/INH AEPB Inhale 1 puff into the lungs daily.    . Fluticasone-Salmeterol (ADVAIR) 500-50 MCG/DOSE AEPB Inhale 1 puff into the lungs 2 (two) times daily.    . hydrALAZINE (APRESOLINE) 50 MG tablet Take 1 tablet (50 mg total) by mouth every 8 (eight) hours. (Patient not taking: Reported on 03/03/2016) 90 tablet 0  . isosorbide mononitrate (IMDUR) 30 MG 24 hr tablet Take 1 tablet (30 mg total) by mouth daily. (Patient not taking: Reported on 03/03/2016) 30 tablet 0  . lisdexamfetamine (VYVANSE) 60 MG capsule Take 60 mg by mouth every morning.    . Saw [place]metto 1000 MG CAPS Take 1 capsule by mouth daily.     No current  facility-administered medications on file prior to visit.     No Known Allergies  Blood pressure (!) 148/102, pulse 70, SpO2 97 %.   Assessment/Plan: Hypertension: Patient's blood pressure is still uncontrolled and greater than goal of <130/80.  1. Continue carvedilol 25mg  BID, valsartan 160mg  daily, hydralazine 50mg  Q8H, and furosemide 40mg  BID. Advised the patient that carvedilol, hydralazine, and furosemide are all on [employer]'s $4 list, and advised patient how to pursue a GoodRx coupon for valsartan, as he is a cash paying patient. Hopefully these strategies will help him achieve adherence with this regimen. Patient understood and agreed that he would call the office if these options were not financially feasible for him. 2. Follow up with Dr. Radford Pax as scheduled and with hypertension clinic as needed for additional medication titration.     Patient was seen with Catie Darnelle Maffucci, PharmD Candidate  Thank you, Lelan Pons. Patterson Hammersmith, [facility]  03/06/2016 12:18 PM

## 2016-03-06 ENCOUNTER — Telehealth: Payer: Self-pay | Admitting: Cardiology

## 2016-03-06 NOTE — Telephone Encounter (Signed)
New message    Pt is calling about the results to his chest xray he had on Friday.

## 2016-03-06 NOTE — Telephone Encounter (Signed)
Scheduler sent call through - informed patient of results and verbal understanding expressed.

## 2016-03-06 NOTE — Telephone Encounter (Signed)
-----   Message from Sueanne Margarita, MD sent at 03/05/2016  7:34 PM EST ----- Stable cxray

## 2016-03-27 ENCOUNTER — Ambulatory Visit: Payer: Self-pay | Admitting: Cardiology

## 2016-04-06 NOTE — Progress Notes (Signed)
Cardiology Office Note    Date:  04/07/2016   ID:  HAGER COMPSTON, DOB July 13, 1965, MRN [id number]  PCP:  [name], MD  Cardiologist:  Fransico Him, MD   Chief Complaint  Patient presents with  . Congestive Heart Failure  . Cardiomyopathy  . Mitral Regurgitation    History of Present Illness:  Vernon Blackburn is a 51 y.o. male w/ PMH of Stage 3-4 CKD, HTN, chronic combined systolic/diastolic CHF with EF 84-16% and severe MR. A NST was obtained on 01/05/16 that showed no ischemia. It was suspected that he had DCM secondary to poorly controlled longstanding HTN. Repeat echo 02/04/2016 on standard HF therapy showed persistently reduced LVF with EF 25% with diffuse HK, severe MR, severe LAE, moderate RAE and moderate TR.  He was seen in HTN clinic on 02/04/2016 and started on Valsartan 80mg  daily as creatinine had remained stable around 2.5 and has history of CKD, HTN and HFrEF.    He is now back for followup today and is doing well.  He is tolerating his meds and has been very compliant.  He denies any chest pain or pressure, SOB, DOE,  dizziness or syncope.  He has not PND or orthopnea.  He has some chronic LLE edema from standing working.  He wears compression hose which help.   Past Medical History:  Diagnosis Date  . Arthritis    Gout  . Asthma   . CHF (congestive heart failure) ([place])    a. 01/2016: echo showing EF of 20-25%, no WMA, severe MR, and PA Peak Pressure of 52 mm Hg.   Marland Kitchen Elbow pain, left   . Knee pain, bilateral   . Left ankle pain 11/04/2010   Gout  . Leg pain, bilateral   . Peripheral edema    Bilateral lower extremity   . Venous insufficiency     Past Surgical History:  Procedure Laterality Date  . GASTRIC BYPASS  2014  . MENISCUS REPAIR  05/2008   right     Current Medications: Current Meds  Medication Sig  . albuterol (PROVENTIL HFA;VENTOLIN HFA) 108 (90 Base) MCG/ACT inhaler Inhale 2 puffs into the lungs every 6 (six) hours as needed for wheezing  or shortness of breath.  Marland Kitchen albuterol (PROVENTIL) (5 MG/ML) 0.5% nebulizer solution Take 2.5 mg by nebulization every 6 (six) hours as needed for wheezing or shortness of breath.  . carvedilol (COREG) 25 MG tablet Take 1 tablet (25 mg total) by mouth 2 (two) times daily.  . FeAspGl-FeFum-B12-FA-C-Succ Ac (FERREX 28 PO) Take 1 tablet by mouth 2 (two) times daily.  . Febuxostat (ULORIC) 80 MG TABS Take 1 tablet by mouth daily.  . finasteride (PROSCAR) 5 MG tablet Take 5 mg by mouth daily.  . fluticasone furoate-vilanterol (BREO ELLIPTA) 100-25 MCG/INH AEPB Inhale 1 puff into the lungs daily.  . Fluticasone-Salmeterol (ADVAIR) 500-50 MCG/DOSE AEPB Inhale 1 puff into the lungs 2 (two) times daily.  . furosemide (LASIX) 40 MG tablet Take 1 tablet (40 mg total) by mouth 2 (two) times daily.  Marland Kitchen lisdexamfetamine (VYVANSE) 60 MG capsule Take 60 mg by mouth every morning.  . valsartan (DIOVAN) 160 MG tablet Take 1 tablet (160 mg total) by mouth daily.    Allergies:   Patient has no known allergies.   Social History   Social History  . Marital status: Married    Spouse name: N/A  . Number of children: N/A  . Years of education: N/A   Social  History Main Topics  . Smoking status: Never Smoker  . Smokeless tobacco: Never Used  . Alcohol use No  . Drug use: No  . Sexual activity: Not Asked   Other Topics Concern  . None   Social History Narrative  . None     Family History:  The patient's family history includes Diabetes in his mother; Heart failure in his mother; Hypertension in his mother.   ROS:   Please see the history of present illness.    ROS All other systems reviewed and are negative.  No flowsheet data found.     PHYSICAL EXAM:   VS:  BP 128/78   Pulse 78   Ht 5\' 10"  (1.778 m)   Wt 226 lb (102.5 kg)   SpO2 97%   BMI 32.43 kg/m    GEN: Well nourished, well developed, in no acute distress  HEENT: normal  Neck: no JVD, carotid bruits, or masses Cardiac: RRR; no  murmurs, rubs, or gallops,no edema.  Intact distal pulses bilaterally.  Respiratory:  clear to auscultation bilaterally, normal work of breathing GI: soft, nontender, nondistended, + BS MS: no deformity or atrophy  Skin: warm and dry, no rash Neuro:  Alert and Oriented x 3, Strength and sensation are intact Psych: euthymic mood, full affect  Wt Readings from Last 3 Encounters:  04/07/16 226 lb (102.5 kg)  02/28/16 229 lb (103.9 kg)  02/07/16 230 lb (104.3 kg)      Studies/Labs Reviewed:   EKG:  EKG is not ordered today.    Recent Labs: 01/02/2016: ALT 91; B Natriuretic Peptide 1,225.2; Hemoglobin 10.2; Platelets 407 03/03/2016: BUN 39; Creatinine, Ser 2.39; Potassium 3.8; Sodium 141   Lipid Panel No results found for: CHOL, TRIG, HDL, CHOLHDL, VLDL, LDLCALC, LDLDIRECT  Additional studies/ records that were reviewed today include:  none    ASSESSMENT:    1. Chronic systolic HF (heart failure) ([place])   2. Dilated cardiomyopathy ([place])   3. Non-rheumatic mitral regurgitation   4. Pulmonary HTN   5. Hypertensive heart and chronic kidney disease with heart failure and stage 1 through stage 4 chronic kidney disease, or chronic kidney disease ([place])      PLAN:  In order of problems listed above:  1. Chronic systolic CHF - he appears euvolemic on exam.  He will continue on BB, Hydralazine, ARB, long acting nitrates and Lasix.  No aldactone due to CKD.  2. DCM - felt to be hypertensive related.  EF remains 25% by last echo but his BP was not adequately controlled.  Now that BP is controlled and he is on maximum HF meds will repeat echo.    3. Severe MR secondary to LV enlargement and mitral annular enlargement.   If still present on repeat echo will refer to Dr. Burt Knack to see if he is a candidate for mitral clip.  4. Pulmonary HTN by echo (PASP 83mmHg on echo) - likely secondary to venous pulmonary HTN from CHF and MR.  Continue diuretics.  Will reassess on repeat echo.  5. HTN  - BP better controlled. CKD is followed by renal.   He will continue on Coreg 25mg  BID, Hydralazine 50mg  TID and Valsartan.I will repeat a BMET today.     Medication Adjustments/Labs and Tests Ordered: Current medicines are reviewed at length with the patient today.  Concerns regarding medicines are outlined above.  Medication changes, Labs and Tests ordered today are listed in the Patient Instructions below.  There are no Patient Instructions  on file for this visit.   Signed, Fransico Him, MD  04/07/2016 7:56 AM    [address], [place], [place]  [address] Phone: [phone]; Fax: [phone]

## 2016-04-07 ENCOUNTER — Encounter (INDEPENDENT_AMBULATORY_CARE_PROVIDER_SITE_OTHER): Payer: Self-pay

## 2016-04-07 ENCOUNTER — Ambulatory Visit (INDEPENDENT_AMBULATORY_CARE_PROVIDER_SITE_OTHER): Payer: Self-pay | Admitting: Cardiology

## 2016-04-07 ENCOUNTER — Encounter: Payer: Self-pay | Admitting: Cardiology

## 2016-04-07 VITALS — BP 128/78 | HR 78 | Ht 70.0 in | Wt 226.0 lb

## 2016-04-07 DIAGNOSIS — I272 Pulmonary hypertension, unspecified: Secondary | ICD-10-CM

## 2016-04-07 DIAGNOSIS — I5022 Chronic systolic (congestive) heart failure: Secondary | ICD-10-CM

## 2016-04-07 DIAGNOSIS — I420 Dilated cardiomyopathy: Secondary | ICD-10-CM

## 2016-04-07 DIAGNOSIS — I130 Hypertensive heart and chronic kidney disease with heart failure and stage 1 through stage 4 chronic kidney disease, or unspecified chronic kidney disease: Secondary | ICD-10-CM

## 2016-04-07 DIAGNOSIS — I340 Nonrheumatic mitral (valve) insufficiency: Secondary | ICD-10-CM

## 2016-04-07 LAB — BASIC METABOLIC PANEL
BUN/Creatinine Ratio: 13 (ref 9–20)
BUN: 37 mg/dL — ABNORMAL HIGH (ref 6–24)
CO2: 23 mmol/L (ref 18–29)
Calcium: 8.8 mg/dL (ref 8.7–10.2)
Chloride: 101 mmol/L (ref 96–106)
Creatinine, Ser: 2.89 mg/dL — ABNORMAL HIGH (ref 0.76–1.27)
GFR calc Af Amer: 28 mL/min/{1.73_m2} — ABNORMAL LOW (ref >59)
GFR calc non Af Amer: 24 mL/min/{1.73_m2} — ABNORMAL LOW (ref >59)
Glucose: 89 mg/dL (ref 65–99)
Potassium: 3.6 mmol/L (ref 3.5–5.2)
Sodium: 142 mmol/L (ref 134–144)

## 2016-04-07 NOTE — Patient Instructions (Signed)
Medication Instructions:  Your physician recommends that you continue on your current medications as directed. Please refer to the Current Medication list given to you today.   Labwork: TODAY: BMET  Testing/Procedures: Your physician has requested that you have an echocardiogram. Echocardiography is a painless test that uses sound waves to create images of your heart. It provides your doctor with information about the size and shape of your heart and how well your heart's chambers and valves are working. This procedure takes approximately one hour. There are no restrictions for this procedure.  Follow-Up: Your physician wants you to follow-up in: 6 months with Dr. Radford Pax. You will receive a reminder letter in the mail two months in advance. If you don't receive a letter, please call our office to schedule the follow-up appointment.   Any Other Special Instructions Will Be Listed Below (If Applicable).     If you need a refill on your cardiac medications before your next appointment, please call your pharmacy.

## 2016-04-21 ENCOUNTER — Other Ambulatory Visit: Payer: Self-pay

## 2016-04-21 ENCOUNTER — Ambulatory Visit (HOSPITAL_COMMUNITY): Payer: Medicaid Other | Attending: Cardiology

## 2016-04-21 DIAGNOSIS — I5022 Chronic systolic (congestive) heart failure: Secondary | ICD-10-CM

## 2016-04-21 DIAGNOSIS — I083 Combined rheumatic disorders of mitral, aortic and tricuspid valves: Secondary | ICD-10-CM | POA: Insufficient documentation

## 2016-04-21 DIAGNOSIS — Z9884 Bariatric surgery status: Secondary | ICD-10-CM | POA: Insufficient documentation

## 2016-04-21 DIAGNOSIS — I420 Dilated cardiomyopathy: Secondary | ICD-10-CM | POA: Diagnosis not present

## 2016-04-21 LAB — ECHOCARDIOGRAM COMPLETE
Ao-asc: 36 cm
FS: 13 % — AB (ref 28–44)
IVS/LV PW RATIO, ED: 1
LA ID, A-P, ES: 56 mm
LA diam end sys: 56 mm
LA diam index: 2.55 cm/m2
LA vol A4C: 118 ml
LA vol index: 56.4 mL/m2
LA vol: 124 mL
LV PW d: 11.2 mm — AB (ref 0.6–1.1)
LVOT SV: 48 mL
LVOT VTI: 8.35 cm
LVOT area: 5.73 cm2
LVOT diameter: 27 mm
LVOT peak vel: 71.7 cm/s
Lateral S' vel: 6.2 cm/s
PISA EROA: 0.61 cm2
PV Reg grad dias: 21 mmHg
PV Reg vel dias: 229 cm/s
RV sys press: 80 mmHg
Reg peak vel: 402 cm/s
TAPSE: 18.3 mm
TDI e' medial: 4.68
TR max vel: 402 cm/s
VTI: 148 cm

## 2016-04-26 ENCOUNTER — Telehealth: Payer: Self-pay | Admitting: Cardiology

## 2016-04-26 DIAGNOSIS — Z01812 Encounter for preprocedural laboratory examination: Secondary | ICD-10-CM

## 2016-04-26 DIAGNOSIS — R000 Tachycardia, unspecified: Secondary | ICD-10-CM

## 2016-04-26 DIAGNOSIS — I340 Nonrheumatic mitral (valve) insufficiency: Secondary | ICD-10-CM

## 2016-04-26 NOTE — Telephone Encounter (Signed)
Informed patient of results and verbal understanding expressed.  Event monitor ordered for scheduling. Patient understands he will be called with TEE instructions tomorrow. He agrees to May 3rd. Patient was grateful for call and awaits call tomorrow.

## 2016-04-26 NOTE — Telephone Encounter (Signed)
-----   Message from Sueanne Margarita, MD sent at 04/23/2016  1:19 PM EDT ----- Please set up TEE with [name] and then refer to him for [place]

## 2016-04-26 NOTE — Telephone Encounter (Signed)
Patient calling in regards to results from echo. Please call to discuss, thanks.

## 2016-04-27 ENCOUNTER — Other Ambulatory Visit: Payer: Self-pay | Admitting: Cardiology

## 2016-04-27 DIAGNOSIS — I340 Nonrheumatic mitral (valve) insufficiency: Secondary | ICD-10-CM

## 2016-04-27 NOTE — Telephone Encounter (Signed)
Scheduled TEE on  5/3 at 0900 with [name]. Reviewed instructions with patient. He will come for lab work and to pick up instruction letter Monday, April 30. He understands R heart cath will be arranged after TEE. Patient was grateful for call and agrees with treatment plan.  Case# [id number]

## 2016-04-28 ENCOUNTER — Encounter: Payer: Self-pay | Admitting: *Deleted

## 2016-05-01 ENCOUNTER — Other Ambulatory Visit: Payer: Self-pay | Admitting: *Deleted

## 2016-05-01 DIAGNOSIS — I340 Nonrheumatic mitral (valve) insufficiency: Secondary | ICD-10-CM

## 2016-05-01 DIAGNOSIS — Z01812 Encounter for preprocedural laboratory examination: Secondary | ICD-10-CM

## 2016-05-01 LAB — BASIC METABOLIC PANEL
BUN/Creatinine Ratio: 15 (ref 9–20)
BUN: 45 mg/dL — ABNORMAL HIGH (ref 6–24)
CO2: 25 mmol/L (ref 18–29)
Calcium: 8.8 mg/dL (ref 8.7–10.2)
Chloride: 101 mmol/L (ref 96–106)
Creatinine, Ser: 3.05 mg/dL — ABNORMAL HIGH (ref 0.76–1.27)
GFR calc Af Amer: 26 mL/min/{1.73_m2} — ABNORMAL LOW (ref >59)
GFR calc non Af Amer: 23 mL/min/{1.73_m2} — ABNORMAL LOW (ref >59)
Glucose: 137 mg/dL — ABNORMAL HIGH (ref 65–99)
Potassium: 3.7 mmol/L (ref 3.5–5.2)
Sodium: 142 mmol/L (ref 134–144)

## 2016-05-01 LAB — CBC WITH DIFFERENTIAL/PLATELET
Basophils Absolute: 0 10*3/uL (ref 0.0–0.2)
Basos: 1 %
EOS (ABSOLUTE): 0.1 10*3/uL (ref 0.0–0.4)
Eos: 2 %
Hematocrit: 32.8 % — ABNORMAL LOW (ref 37.5–51.0)
Hemoglobin: 10.4 g/dL — ABNORMAL LOW (ref 13.0–17.7)
Immature Grans (Abs): 0 10*3/uL (ref 0.0–0.1)
Immature Granulocytes: 0 %
Lymphocytes Absolute: 0.9 10*3/uL (ref 0.7–3.1)
Lymphs: 20 %
MCH: 24.7 pg — ABNORMAL LOW (ref 26.6–33.0)
MCHC: 31.7 g/dL (ref 31.5–35.7)
MCV: 78 fL — ABNORMAL LOW (ref 79–97)
Monocytes Absolute: 0.3 10*3/uL (ref 0.1–0.9)
Monocytes: 7 %
Neutrophils Absolute: 3.3 10*3/uL (ref 1.4–7.0)
Neutrophils: 70 %
Platelets: 291 10*3/uL (ref 150–379)
RBC: 4.21 x10E6/uL (ref 4.14–5.80)
RDW: 17.7 % — ABNORMAL HIGH (ref 12.3–15.4)
WBC: 4.6 10*3/uL (ref 3.4–10.8)

## 2016-05-01 LAB — APTT: aPTT: 28 s (ref 24–33)

## 2016-05-01 LAB — PROTIME-INR
INR: 1.1 (ref 0.8–1.2)
Prothrombin Time: 11.9 s (ref 9.1–12.0)

## 2016-05-04 ENCOUNTER — Encounter (HOSPITAL_COMMUNITY): Payer: Self-pay | Admitting: *Deleted

## 2016-05-04 ENCOUNTER — Encounter (HOSPITAL_COMMUNITY)
Admission: RE | Disposition: A | Discharge status: Home / Self Care | Payer: Self-pay | Source: Ambulatory Visit | Attending: Cardiology

## 2016-05-04 ENCOUNTER — Ambulatory Visit (HOSPITAL_BASED_OUTPATIENT_CLINIC_OR_DEPARTMENT_OTHER): Payer: Medicaid Other

## 2016-05-04 ENCOUNTER — Ambulatory Visit (HOSPITAL_COMMUNITY)
Admission: RE | Admit: 2016-05-04 | Discharge: 2016-05-04 | Disposition: A | Discharge status: Home / Self Care | Payer: Medicaid Other | Source: Ambulatory Visit | Attending: Cardiology | Admitting: Cardiology

## 2016-05-04 DIAGNOSIS — I272 Pulmonary hypertension, unspecified: Secondary | ICD-10-CM | POA: Diagnosis not present

## 2016-05-04 DIAGNOSIS — I340 Nonrheumatic mitral (valve) insufficiency: Secondary | ICD-10-CM

## 2016-05-04 DIAGNOSIS — Z79899 Other long term (current) drug therapy: Secondary | ICD-10-CM | POA: Diagnosis not present

## 2016-05-04 DIAGNOSIS — N184 Chronic kidney disease, stage 4 (severe): Secondary | ICD-10-CM | POA: Diagnosis not present

## 2016-05-04 DIAGNOSIS — I5042 Chronic combined systolic (congestive) and diastolic (congestive) heart failure: Secondary | ICD-10-CM | POA: Insufficient documentation

## 2016-05-04 DIAGNOSIS — M109 Gout, unspecified: Secondary | ICD-10-CM | POA: Insufficient documentation

## 2016-05-04 DIAGNOSIS — I130 Hypertensive heart and chronic kidney disease with heart failure and stage 1 through stage 4 chronic kidney disease, or unspecified chronic kidney disease: Secondary | ICD-10-CM | POA: Insufficient documentation

## 2016-05-04 DIAGNOSIS — I429 Cardiomyopathy, unspecified: Secondary | ICD-10-CM | POA: Diagnosis not present

## 2016-05-04 DIAGNOSIS — Z9884 Bariatric surgery status: Secondary | ICD-10-CM | POA: Insufficient documentation

## 2016-05-04 DIAGNOSIS — J45909 Unspecified asthma, uncomplicated: Secondary | ICD-10-CM | POA: Diagnosis not present

## 2016-05-04 DIAGNOSIS — Z7951 Long term (current) use of inhaled steroids: Secondary | ICD-10-CM | POA: Diagnosis not present

## 2016-05-04 HISTORY — PX: TEE WITHOUT CARDIOVERSION: SHX5443

## 2016-05-04 SURGERY — ECHOCARDIOGRAM, TRANSESOPHAGEAL
Anesthesia: Moderate Sedation

## 2016-05-04 MED ORDER — MIDAZOLAM HCL 10 MG/2ML IJ SOLN
INTRAMUSCULAR | Status: DC | PRN
Start: 2016-05-04 — End: 2016-05-04
  Administered 2016-05-04 (×2): 2 mg via INTRAVENOUS
  Administered 2016-05-04: 1 mg via INTRAVENOUS
  Administered 2016-05-04: 2 mg via INTRAVENOUS

## 2016-05-04 MED ORDER — HYDRALAZINE HCL 20 MG/ML IJ SOLN
INTRAMUSCULAR | Status: DC | PRN
  Administered 2016-05-04: 10 mg via INTRAVENOUS
  Administered 2016-05-04: 20 mg via INTRAVENOUS
  Administered 2016-05-04: 10 mg via INTRAVENOUS

## 2016-05-04 MED ORDER — MIDAZOLAM HCL 5 MG/ML IJ SOLN
INTRAMUSCULAR | Status: AC
  Filled 2016-05-04: qty 2

## 2016-05-04 MED ORDER — HYDRALAZINE HCL 20 MG/ML IJ SOLN
INTRAMUSCULAR | Status: AC
  Filled 2016-05-04: qty 1

## 2016-05-04 MED ORDER — SODIUM CHLORIDE 0.9 % IV SOLN
INTRAVENOUS | Status: DC
  Administered 2016-05-04: 08:00:00 via INTRAVENOUS

## 2016-05-04 MED ORDER — FENTANYL CITRATE (PF) 100 MCG/2ML IJ SOLN
INTRAMUSCULAR | Status: DC | PRN
  Administered 2016-05-04: 50 ug via INTRAVENOUS

## 2016-05-04 MED ORDER — BUTAMBEN-TETRACAINE-BENZOCAINE 2-2-14 % EX AERO
INHALATION_SPRAY | CUTANEOUS | Status: DC | PRN
  Administered 2016-05-04: 2 via TOPICAL

## 2016-05-04 MED ORDER — FENTANYL CITRATE (PF) 100 MCG/2ML IJ SOLN
INTRAMUSCULAR | Status: AC
  Filled 2016-05-04: qty 2

## 2016-05-04 NOTE — CV Procedure (Signed)
Procedure: TEE  Indication: Mitral regurgitation.   Sedation: 7 mg IV Versed, 50 mcg IV Fentanyl.  Patient received hydralazine 40 mg IV total for elevated BP.   Findings: Please see echo section for full report.  The left ventricle was moderately dilated with normal wall thickness.  Severe global hypokinesis, estimate EF 25-30%.  Normal RV size with mildly decreased systolic function.  Moderate left atrial enlargement, no LAA thrombus.  Mild right atrial enlargement.  Mild TR with peak RV-RA gradient 55 mmHg.  Trileaflet aortic valve with trivial AI, no aortic stenosis.  The mitral valve was mildly thickened especially towards the tip of the anterior leaflet.  There was inadequate coaptation of the leaflets with severe posterioly-directed mitral regurgitation.  ERO 0.52 cm^2.  There was systolic flow reversal on the pulmonary vein doppler pattern.  The aorta was normal in caliber with mild plaque in the descending thoracic aorta.  No ASD/PFO, bubble study negative.    Impression: Severe mitral regurgitation.  Suspect there is a component of secondary MR from dilated LV, but there is thickening of the leaflets, possible primary MR component as well.    Vernon Blackburn 05/04/2016 9:43 AM

## 2016-05-04 NOTE — Discharge Instructions (Signed)

## 2016-05-04 NOTE — Interval H&P Note (Signed)
History and Physical Interval Note:  05/04/2016 9:07 AM  Vernon Blackburn  has presented today for surgery, with the diagnosis of MR  The various methods of treatment have been discussed with the patient and family. After consideration of risks, benefits and other options for treatment, the patient has consented to  Procedure(s): TRANSESOPHAGEAL ECHOCARDIOGRAM (TEE) (N/A) as a surgical intervention .  The patient's history has been reviewed, patient examined, no change in status, stable for surgery.  I have reviewed the patient's chart and labs.  Questions were answered to the patient's satisfaction.     Schuyler Behan [employer]

## 2016-05-04 NOTE — H&P (View-Only) (Signed)
Cardiology Office Note    Date:  04/07/2016   ID:  [name], DOB 03/19/1965, MRN [id number]  PCP:  [name], MD  Cardiologist:  Fransico Him, MD   Chief Complaint  Patient presents with  . Congestive Heart Failure  . Cardiomyopathy  . Mitral Regurgitation    History of Present Illness:  Vernon Blackburn is a 51 y.o. male w/ PMH of Stage 3-4 CKD, HTN, chronic combined systolic/diastolic CHF with EF 64-33% and severe MR. A NST was obtained on 01/05/16 that showed no ischemia. It was suspected that he had DCM secondary to poorly controlled longstanding HTN. Repeat echo 02/04/2016 on standard HF therapy showed persistently reduced LVF with EF 25% with diffuse HK, severe MR, severe LAE, moderate RAE and moderate TR.  He was seen in HTN clinic on 02/04/2016 and started on Valsartan 80mg  daily as creatinine had remained stable around 2.5 and has history of CKD, HTN and HFrEF.    He is now back for followup today and is doing well.  He is tolerating his meds and has been very compliant.  He denies any chest pain or pressure, SOB, DOE,  dizziness or syncope.  He has not PND or orthopnea.  He has some chronic LLE edema from standing working.  He wears compression hose which help.   Past Medical History:  Diagnosis Date  . Arthritis    Gout  . Asthma   . CHF (congestive heart failure) ([place])    a. 01/2016: echo showing EF of 20-25%, no WMA, severe MR, and PA Peak Pressure of 52 mm Hg.   Marland Kitchen Elbow pain, left   . Knee pain, bilateral   . Left ankle pain 11/04/2010   Gout  . Leg pain, bilateral   . Peripheral edema    Bilateral lower extremity   . Venous insufficiency     Past Surgical History:  Procedure Laterality Date  . GASTRIC BYPASS  2014  . MENISCUS REPAIR  05/2008   right     Current Medications: Current Meds  Medication Sig  . albuterol (PROVENTIL HFA;VENTOLIN HFA) 108 (90 Base) MCG/ACT inhaler Inhale 2 puffs into the lungs every 6 (six) hours as needed for wheezing  or shortness of breath.  Marland Kitchen albuterol (PROVENTIL) (5 MG/ML) 0.5% nebulizer solution Take 2.5 mg by nebulization every 6 (six) hours as needed for wheezing or shortness of breath.  . carvedilol (COREG) 25 MG tablet Take 1 tablet (25 mg total) by mouth 2 (two) times daily.  . FeAspGl-FeFum-B12-FA-C-Succ Ac (FERREX 28 PO) Take 1 tablet by mouth 2 (two) times daily.  . Febuxostat (ULORIC) 80 MG TABS Take 1 tablet by mouth daily.  . finasteride (PROSCAR) 5 MG tablet Take 5 mg by mouth daily.  . fluticasone furoate-vilanterol (BREO ELLIPTA) 100-25 MCG/INH AEPB Inhale 1 puff into the lungs daily.  . Fluticasone-Salmeterol (ADVAIR) 500-50 MCG/DOSE AEPB Inhale 1 puff into the lungs 2 (two) times daily.  . furosemide (LASIX) 40 MG tablet Take 1 tablet (40 mg total) by mouth 2 (two) times daily.  Marland Kitchen lisdexamfetamine (VYVANSE) 60 MG capsule Take 60 mg by mouth every morning.  . valsartan (DIOVAN) 160 MG tablet Take 1 tablet (160 mg total) by mouth daily.    Allergies:   Patient has no known allergies.   Social History   Social History  . Marital status: Married    Spouse name: N/A  . Number of children: N/A  . Years of education: N/A   Social  History Main Topics  . Smoking status: Never Smoker  . Smokeless tobacco: Never Used  . Alcohol use No  . Drug use: No  . Sexual activity: Not Asked   Other Topics Concern  . None   Social History Narrative  . None     Family History:  The patient's family history includes Diabetes in his mother; Heart failure in his mother; Hypertension in his mother.   ROS:   Please see the history of present illness.    ROS All other systems reviewed and are negative.  No flowsheet data found.     PHYSICAL EXAM:   VS:  BP 128/78   Pulse 78   Ht 5\' 10"  (1.778 m)   Wt 226 lb (102.5 kg)   SpO2 97%   BMI 32.43 kg/m    GEN: Well nourished, well developed, in no acute distress  HEENT: normal  Neck: no JVD, carotid bruits, or masses Cardiac: RRR; no  murmurs, rubs, or gallops,no edema.  Intact distal pulses bilaterally.  Respiratory:  clear to auscultation bilaterally, normal work of breathing GI: soft, nontender, nondistended, + BS MS: no deformity or atrophy  Skin: warm and dry, no rash Neuro:  Alert and Oriented x 3, Strength and sensation are intact Psych: euthymic mood, full affect  Wt Readings from Last 3 Encounters:  04/07/16 226 lb (102.5 kg)  02/28/16 229 lb (103.9 kg)  02/07/16 230 lb (104.3 kg)      Studies/Labs Reviewed:   EKG:  EKG is not ordered today.    Recent Labs: 01/02/2016: ALT 91; B Natriuretic Peptide 1,225.2; Hemoglobin 10.2; Platelets 407 03/03/2016: BUN 39; Creatinine, Ser 2.39; Potassium 3.8; Sodium 141   Lipid Panel No results found for: CHOL, TRIG, HDL, CHOLHDL, VLDL, LDLCALC, LDLDIRECT  Additional studies/ records that were reviewed today include:  none    ASSESSMENT:    1. Chronic systolic HF (heart failure) ([place])   2. Dilated cardiomyopathy ([place])   3. Non-rheumatic mitral regurgitation   4. Pulmonary HTN   5. Hypertensive heart and chronic kidney disease with heart failure and stage 1 through stage 4 chronic kidney disease, or chronic kidney disease ([place])      PLAN:  In order of problems listed above:  1. Chronic systolic CHF - he appears euvolemic on exam.  He will continue on BB, Hydralazine, ARB, long acting nitrates and Lasix.  No aldactone due to CKD.  2. DCM - felt to be hypertensive related.  EF remains 25% by last echo but his BP was not adequately controlled.  Now that BP is controlled and he is on maximum HF meds will repeat echo.    3. Severe MR secondary to LV enlargement and mitral annular enlargement.   If still present on repeat echo will refer to Dr. Burt Knack to see if he is a candidate for mitral clip.  4. Pulmonary HTN by echo (PASP 78mmHg on echo) - likely secondary to venous pulmonary HTN from CHF and MR.  Continue diuretics.  Will reassess on repeat echo.  5. HTN  - BP better controlled. CKD is followed by renal.   He will continue on Coreg 25mg  BID, Hydralazine 50mg  TID and Valsartan.I will repeat a BMET today.     Medication Adjustments/Labs and Tests Ordered: Current medicines are reviewed at length with the patient today.  Concerns regarding medicines are outlined above.  Medication changes, Labs and Tests ordered today are listed in the Patient Instructions below.  There are no Patient Instructions  on file for this visit.   Signed, Fransico Him, MD  04/07/2016 7:56 AM    [facility], [place], [place]  [address] Phone: [phone]; Fax: [phone]

## 2016-05-05 ENCOUNTER — Telehealth (HOSPITAL_COMMUNITY): Payer: Self-pay | Admitting: *Deleted

## 2016-05-05 NOTE — Telephone Encounter (Signed)
Patient called asking about his Echo/TEE results from yesterday.  I called back but had to leave a message explaining that the final results have not resulted yet but that we will call him when they are ready.  I also told him to call us back if he had any other questions at this time.

## 2016-05-08 ENCOUNTER — Encounter (HOSPITAL_COMMUNITY): Payer: Self-pay | Admitting: Cardiology

## 2016-05-18 ENCOUNTER — Ambulatory Visit (INDEPENDENT_AMBULATORY_CARE_PROVIDER_SITE_OTHER): Payer: Self-pay

## 2016-05-18 DIAGNOSIS — R000 Tachycardia, unspecified: Secondary | ICD-10-CM

## 2016-05-22 ENCOUNTER — Ambulatory Visit (HOSPITAL_COMMUNITY)
Admission: RE | Admit: 2016-05-22 | Discharge: 2016-05-22 | Disposition: A | Discharge status: Home / Self Care | Payer: Medicaid Other | Source: Ambulatory Visit | Attending: Cardiology | Admitting: Cardiology

## 2016-05-22 ENCOUNTER — Encounter (HOSPITAL_COMMUNITY): Payer: Self-pay

## 2016-05-22 VITALS — BP 170/116 | HR 85 | Ht 70.0 in | Wt 225.0 lb

## 2016-05-22 DIAGNOSIS — M109 Gout, unspecified: Secondary | ICD-10-CM | POA: Insufficient documentation

## 2016-05-22 DIAGNOSIS — Z8249 Family history of ischemic heart disease and other diseases of the circulatory system: Secondary | ICD-10-CM | POA: Diagnosis not present

## 2016-05-22 DIAGNOSIS — Z79899 Other long term (current) drug therapy: Secondary | ICD-10-CM | POA: Insufficient documentation

## 2016-05-22 DIAGNOSIS — I340 Nonrheumatic mitral (valve) insufficiency: Secondary | ICD-10-CM

## 2016-05-22 DIAGNOSIS — N184 Chronic kidney disease, stage 4 (severe): Secondary | ICD-10-CM | POA: Insufficient documentation

## 2016-05-22 DIAGNOSIS — I5022 Chronic systolic (congestive) heart failure: Secondary | ICD-10-CM | POA: Diagnosis present

## 2016-05-22 DIAGNOSIS — I130 Hypertensive heart and chronic kidney disease with heart failure and stage 1 through stage 4 chronic kidney disease, or unspecified chronic kidney disease: Secondary | ICD-10-CM | POA: Diagnosis not present

## 2016-05-22 DIAGNOSIS — Z833 Family history of diabetes mellitus: Secondary | ICD-10-CM | POA: Insufficient documentation

## 2016-05-22 DIAGNOSIS — I420 Dilated cardiomyopathy: Secondary | ICD-10-CM | POA: Diagnosis not present

## 2016-05-22 LAB — BASIC METABOLIC PANEL
Anion gap: 8 (ref 5–15)
BUN: 50 mg/dL — ABNORMAL HIGH (ref 6–20)
CO2: 25 mmol/L (ref 22–32)
Calcium: 8.2 mg/dL — ABNORMAL LOW (ref 8.9–10.3)
Chloride: 107 mmol/L (ref 101–111)
Creatinine, Ser: 2.89 mg/dL — ABNORMAL HIGH (ref 0.61–1.24)
GFR calc Af Amer: 28 mL/min — ABNORMAL LOW (ref >60)
GFR calc non Af Amer: 24 mL/min — ABNORMAL LOW (ref >60)
Glucose, Bld: 115 mg/dL — ABNORMAL HIGH (ref 65–99)
Potassium: 3.3 mmol/L — ABNORMAL LOW (ref 3.5–5.1)
Sodium: 140 mmol/L (ref 135–145)

## 2016-05-22 LAB — BRAIN NATRIURETIC PEPTIDE: B Natriuretic Peptide: 789.2 pg/mL — ABNORMAL HIGH (ref 0.0–100.0)

## 2016-05-22 MED ORDER — POTASSIUM CHLORIDE ER 10 MEQ PO TBCR: 10.0000 meq | EXTENDED_RELEASE_TABLET | Freq: Every day | ORAL | 3 refills | Status: DC

## 2016-05-22 MED ORDER — HYDRALAZINE HCL 50 MG PO TABS
75.0000 mg | ORAL_TABLET | Freq: Three times a day (TID) | ORAL | 3 refills | Status: DC
Start: 2016-05-22 — End: 2016-05-30

## 2016-05-22 MED ORDER — FUROSEMIDE 40 MG PO TABS: ORAL_TABLET | ORAL | 3 refills | Status: DC

## 2016-05-22 MED ORDER — ISOSORBIDE MONONITRATE ER 30 MG PO TB24: 30.0000 mg | ORAL_TABLET | Freq: Every day | ORAL | 3 refills | Status: DC

## 2016-05-22 NOTE — Patient Instructions (Signed)
INCREASE Hydralazine to 75mg  three times daily.  INCREASE Imdur to 30mg  daily.  INCREASE Lasix to 80mg  in the AM and 40mg  in the PM.  START Potassium 41meq daily.  Routine lab work today. Will notify you of abnormal results  You have been referred to [facility] (Dr.Owen) for Mitral Valve repair.  Follow up with Dr.[name] in 7-10 days.

## 2016-05-23 NOTE — Progress Notes (Signed)
PCP: Dr. Delena Bali Cardiology: Dr. Radford Pax HF Cardiology: [name]  51 yo seen in consultation today for CHF and severe MR, referred by Dr. Radford Pax.   Patient has history of long-standing, poorly-controlled HTN, CKD stage 3-4, chronic systolic CHF and severe mitral regurgitation.  He has had hypertension for years.  He has had known CKD, followed by Dr. [name].  He developed severe dyspnea in 12/17 and was admitted with acute systolic CHF.  Echo showed EF 20-25% with severe MR.  He was diuresed and discharged.    Since then, he continues to have exertional dyspnea.  He is short of breath walking longer distances and walking up hills/stairs. No chest pain.  No orthopnea/PND. Still has significant lower extremity edema.  TEE was done in 5/18 to assess mitral valve.  There is severe MR.  The MV is thickened and does not coapt well.  Suspect there is a component of secondary MR with moderate LV dilation, however the thickened leaflets suggest a component of primary MR as well.   ECG (12/17, personally reviewed): NSR, diffuse nonspecific T wave changes (QRS is not wide).   Labs (4/18): K 3.7, creatinine 3.05, hgb 10.4  PMH: 1. Gout 2. HTN: Long-standing, poor control. 3. CKD: Stage 3-4, followed by Dr. [name].  Likely hypertensive nephropathy.  4. Dilated cardiomyopathy: May be due to long-standing HTN.  - Echo (1/18): EF 20-25%, severe MR - Echo (2/18): EF 25%, severe MR.  - TEE (5/18): Moderate LV dilation with severe global hypokinesis, EF 25-30%, normal RV size with mildly decreased systolic function, RV-RA gradient 55 mmHg, severe MR with mildly thickened leaflets with inadequate coaptation and ERO 0.52 cm^2 by PISA and systolic flow reversal in the pulmonary vein doppler pattern => secondary MR from dilated LV but thickened leaflets lead to concern for component of primary MR as well.  5. Severe MR: See TEE report above.   SH: Married, lives in [place], nonsmoker, no drugs, occasional ETOH.    Family History  Problem Relation Age of Onset  . Diabetes Mother   . Hypertension Mother   . Heart failure Mother    ROS: All systems reviewed and negative except as per HPI.   Current Outpatient Prescriptions  Medication Sig Dispense Refill  . albuterol (PROVENTIL HFA;VENTOLIN HFA) 108 (90 Base) MCG/ACT inhaler Inhale 2 puffs into the lungs every 6 (six) hours as needed for wheezing or shortness of breath.    Marland Kitchen albuterol (PROVENTIL) (5 MG/ML) 0.5% nebulizer solution Take 2.5 mg by nebulization every 6 (six) hours as needed for wheezing or shortness of breath.    . FeAspGl-FeFum-B12-FA-C-Succ Ac (FERREX 28 PO) Take 1 tablet by mouth 2 (two) times daily.    . Febuxostat (ULORIC) 80 MG TABS Take 1 tablet by mouth daily.    . finasteride (PROSCAR) 5 MG tablet Take 5 mg by mouth daily.    . fluticasone furoate-vilanterol (BREO ELLIPTA) 100-25 MCG/INH AEPB Inhale 1 puff into the lungs daily.    . Fluticasone-Salmeterol (ADVAIR) 500-50 MCG/DOSE AEPB Inhale 1 puff into the lungs 2 (two) times daily.    . furosemide (LASIX) 40 MG tablet Take 2 tablets in the AM and 1 tablet in the PM 90 tablet 3  . hydrALAZINE (APRESOLINE) 50 MG tablet Take 1.5 tablets (75 mg total) by mouth 3 (three) times daily. 135 tablet 3  . lisdexamfetamine (VYVANSE) 60 MG capsule Take 60 mg by mouth every morning.    . valsartan (DIOVAN) 160 MG tablet Take 1 tablet (  160 mg total) by mouth daily. 30 tablet 11  . carvedilol (COREG) 25 MG tablet Take 1 tablet (25 mg total) by mouth 2 (two) times daily. 180 tablet 3  . isosorbide mononitrate (IMDUR) 30 MG 24 hr tablet Take 1 tablet (30 mg total) by mouth daily. 30 tablet 3  . potassium chloride (K-DUR) 10 MEQ tablet Take 1 tablet (10 mEq total) by mouth daily. 30 tablet 3   No current facility-administered medications for this encounter.    BP (!) 170/116   Pulse 85   Ht 5\' 10"  (1.778 m)   Wt 225 lb (102.1 kg)   SpO2 98%   BMI 32.28 kg/m  General: NAD Neck: JVP  10-12 cm, no thyromegaly or thyroid nodule.  Lungs: Clear to auscultation bilaterally with normal respiratory effort. CV: Nondisplaced PMI.  Heart regular S1/S2, no S3/S4, 3/6 HSM apex.  1+ edema to knees bilaterally.  No carotid bruit.  Normal pedal pulses.  Abdomen: Soft, nontender, no hepatosplenomegaly, no distention.  Skin: Intact without lesions or rashes.  Neurologic: Alert and oriented x 3.  Psych: Normal affect. Extremities: No clubbing or cyanosis.  HEENT: Normal.   Assessment/Plan: 1. HTN: BP remains high today.  Possible hypertensive cardiomyopathy and hypertensive nephropathy.  Needs good BP control.  - Increase hydralazine to 75 mg tid and add Imdur 30 daily.  2. CKD: Stage 4.  Followed by nephrology. Needs BMET today.  3. Chronic systolic CHF: EF 53-00% with moderate LV dilation and diffuse LV hypokinesis on TEE in 5/18. Cause of cardiomyopathy may be long-standing, poorly controlled HTN.  He has not had an ischemic workup.  NYHA class III symptoms.  He is volume overloaded on exam.  - Increase Lasix to 80 qam/40 qpm.  Add KCl 10 daily.  BMET/BNP today and repeat BMET in 7-10 days.  - Increase hydralazine to 75 mg tid and add Imdur 30 daily.  - Continue Coreg 25 mg bid.  - Continue current valsartan as long as creatinine remains stable.  - After better diuresis, will plan RHC.  - If we end up planning a mitral valve intervention, which he may need, will have to do coronary angiography.  Will need to minimize contrast as much as possible for this given high risk for contrast nephropathy.  4. Mitral regurgitation: Severe.  See TEE report above.  I suspect there is a component of functional MR from dilated LV, but MV leaflets are thickened, and I am concerned for a component of primary MR as well.  Will work aggressively to diurese and control BP.  If there is still severe MR with full diuresis and BP control, think he will need either mitral valve repair or Mitraclip.  His renal  function is going to be a confounding factor when deciding on mitral valve intervention.  I am going to refer him to Dr. Roxy Manns for evaluation.   I want to see him back in 7-10 days for medication adjustment.   Loralie Champagne 05/23/2016

## 2016-05-25 ENCOUNTER — Institutional Professional Consult (permissible substitution) (INDEPENDENT_AMBULATORY_CARE_PROVIDER_SITE_OTHER): Payer: Self-pay | Admitting: Thoracic Surgery (Cardiothoracic Vascular Surgery)

## 2016-05-25 ENCOUNTER — Encounter: Payer: Self-pay | Admitting: Thoracic Surgery (Cardiothoracic Vascular Surgery)

## 2016-05-25 VITALS — BP 163/122 | HR 77 | Resp 16 | Ht 70.0 in | Wt 225.0 lb

## 2016-05-25 DIAGNOSIS — I5022 Chronic systolic (congestive) heart failure: Secondary | ICD-10-CM

## 2016-05-25 DIAGNOSIS — I340 Nonrheumatic mitral (valve) insufficiency: Secondary | ICD-10-CM

## 2016-05-25 NOTE — Progress Notes (Addendum)
[address]       [place] [address]             [phone]     CARDIOTHORACIC SURGERY CONSULTATION REPORT  Referring Provider is Larey Dresser, MD  Primary Cardiologist is Sueanne Margarita, MD PCP is [name], MD  Chief Complaint  Patient presents with  . Congestive Heart Failure    eval for MVR...ECHO 04/21/16, TEE 05/04/16  . Mitral Regurgitation    SEVERE with PROLAPSE    HPI:  Patient is a 51 year old African-American male with history of mitral regurgitation, severe left ventricular systolic dysfunction with chronic systolic congestive heart failure, long-standing poorly controlled hypertension, chronic kidney disease (stage 3-4), and OSA who has been referred for surgical consultation to discuss treatment options for management of severe symptomatic mitral regurgitation. The patient states that he has been told for most of his life that he had a heart murmur but he had never previously undergone any type of cardiac diagnostic evaluation until recently. He has long-standing history of hypertension, chronic kidney disease, and morbid obesity. He underwent gastric bypass surgery approximately 3 years ago and successfully lost a large amount of weight.  He was noted to have severe hypertension and chronic kidney disease for which she has been followed for several years by Dr. [name].  Several months ago the patient began to develop worsening shortness of breath and lower extremity edema. He ultimately was hospitalized from 01/02/2016 through 01/06/2016 with accelerated hypertension and acute systolic congestive heart failure, NYHA class IV.  Transthoracic echocardiogram performed at that time revealed severe left ventricular systolic dysfunction with ejection fraction estimated 20-25%. There was severe mitral regurgitation, severe left atrial enlargement and moderately elevated pulmonary artery pressures.  He improved clinically with medical treatment of  hypertension and volume overload. Nuclear medicine stress test revealed perfusion defects consistent with previous septal and inferior wall myocardial infarctions but no reversible defects to suggest ischemia. There was global hypokinesis with ejection fraction calculated 33%. Diagnostic cardiac catheterization was not performed.  The patient has been followed ever since by Dr. Radford Pax, and repeat echocardiogram performed 02/04/2016 revealed ejection fraction 25% with severe mitral regurgitation and moderate tricuspid regurgitation. Renal function remained stable with serum creatinine approximately 2.5. Clinically the patient did well on medical therapy improved symptoms of exertional shortness of breath and stable volume status.  The patient was subsequently referred to [name] in the advanced heart failure clinic to perform follow-up transesophageal echocardiogram with the patient on optimal medical therapy. TEE performed 05/04/2016 revealed severe mitral regurgitation with severe left ventricular systolic dysfunction. Concerns were raised regarding the possibility that the patient might have combination of both primary and secondary mitral regurgitation, and was subsequently referred for elective surgical consultation.  The patient lives locally in [place] and just graduated from Sports [employer] school. 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 He is separated from his wife and headed for divorce. He has 2 adult children. He has a long-standing history of severe hypertension and admits that for many years he was not good about taking medications or taking care of himself. Although he was physically active and played football in college, shortly after that he became morbidly obese. 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 Approximately 3 years ago he underwent gastric bypass surgery and  lost  a tremendous amount of weight. He currently weighs approximately 225 pounds and is 5 foot 10 inches tall. He states that he was told during his youth that he had a heart murmur but that it was nothing to worry about. He never had any kind of cardiac evaluation until several months ago when he presented with acute systolic congestive heart failure as noted above. Since hospital discharge in January the patient has been compliant with medical therapy and following up regularly with all of his doctors. He has done well clinically and remained quite stable. He admits that he does get short of breath with more strenuous exertion but this does not limit his daily activities. He tries to walk every day. He denies any resting shortness of breath, PND, orthopnea, dizzy spells, or syncope. He still has some intermittent lower extremity edema and he reports occasional palpitations. He has intermittent wheezing that has been attributed to asthma. He has sleep apnea but doesn't use his CPAP very often.    Past Medical History:  Diagnosis Date  . Anemia, chronic disease   . Arthritis    Gout  . Asthma   . Cardiomyopathy ([place])   . CHF (congestive heart failure) ([place])    a. 01/2016: echo showing EF of 20-25%, no WMA, severe MR, and PA Peak Pressure of 52 mm Hg.   Marland Kitchen Chronic systolic HF (heart failure) ([place])   . CKD (chronic kidney disease)    Dr [name]  . Elbow pain, left   . Knee pain, bilateral   . Left ankle pain 11/04/2010   Gout  . Leg pain, bilateral   . Peripheral edema    Bilateral lower extremity   . Pulmonary HTN ([place])   . Severe mitral regurgitation   . Venous insufficiency     Past Surgical History:  Procedure Laterality Date  . GASTRIC BYPASS  2014  . MENISCUS REPAIR  05/2008   right   . TEE WITHOUT CARDIOVERSION N/A 05/04/2016   Procedure: TRANSESOPHAGEAL ECHOCARDIOGRAM (TEE);  Surgeon: Larey Dresser, MD;  [facility] ENDOSCOPY;  Service: Cardiovascular;  Laterality: N/A;     Family History  Problem Relation Age of Onset  . Diabetes Mother   . Hypertension Mother   . Heart failure Mother     Social History   Social History  . Marital status: Married    Spouse name: N/A  . Number of children: N/A  . Years of education: N/A   Occupational History  . Not on file.   Social History Main Topics  . Smoking status: Never Smoker  . Smokeless tobacco: Never Used  . Alcohol use No  . Drug use: No  . Sexual activity: Not on file   Other Topics Concern  . Not on file   Social History Narrative  . No narrative on file    Current Outpatient Prescriptions  Medication Sig Dispense Refill  . albuterol (PROVENTIL) (5 MG/ML) 0.5% nebulizer solution Take 2.5 mg by nebulization every 6 (six) hours as needed for wheezing or shortness of breath.    . carvedilol (COREG) 25 MG tablet Take 1 tablet (25 mg total) by mouth 2 (two) times daily. 180 tablet 3  . FeAspGl-FeFum-B12-FA-C-Succ Ac (FERREX 28 PO) Take 1 tablet by mouth 2 (two) times daily.    . Febuxostat (ULORIC) 80 MG TABS Take 1 tablet by mouth daily.    . Fluticasone-Salmeterol (ADVAIR) 500-50 MCG/DOSE AEPB Inhale 1 puff into the lungs 2 (two) times daily.    Marland Kitchen  furosemide (LASIX) 40 MG tablet Take 2 tablets in the AM and 1 tablet in the PM 90 tablet 3  . hydrALAZINE (APRESOLINE) 50 MG tablet Take 1.5 tablets (75 mg total) by mouth 3 (three) times daily. 135 tablet 3  . isosorbide mononitrate (IMDUR) 30 MG 24 hr tablet Take 1 tablet (30 mg total) by mouth daily. 30 tablet 3  . lisdexamfetamine (VYVANSE) 60 MG capsule Take 60 mg by mouth every morning.    . potassium chloride (K-DUR) 10 MEQ tablet Take 1 tablet (10 mEq total) by mouth daily. 30 tablet 3  . valsartan (DIOVAN) 160 MG tablet Take 1 tablet (160 mg total) by mouth daily. 30 tablet 11  . albuterol (PROVENTIL HFA;VENTOLIN HFA) 108 (90 Base) MCG/ACT inhaler Inhale 2 puffs into the lungs every 6 (six) hours as needed for wheezing or shortness of  breath.     No current facility-administered medications for this visit.     No Known Allergies    Review of Systems:   General:  normal appetite, decreased energy, no weight gain, no weight loss, no fever  Cardiac:  no chest pain with exertion, no chest pain at rest, +SOB with exertion, no resting SOB, no PND, no orthopnea, + palpitations, no arrhythmia, no atrial fibrillation, + LE edema, no dizzy spells, no syncope  Respiratory:  + exertional shortness of breath, no home oxygen, no productive cough, no dry cough, no bronchitis, + wheezing, no hemoptysis, + asthma, no pain with inspiration or cough, + sleep apnea, + CPAP at night  GI:   no difficulty swallowing, no reflux, no frequent heartburn, no hiatal hernia, no abdominal pain, no constipation, no diarrhea, no hematochezia, no hematemesis, no melena  GU:   no dysuria,  no frequency, no urinary tract infection, no hematuria, + enlarged prostate, no kidney stones, + kidney disease  Vascular:  no pain suggestive of claudication, + pain in feet, no leg cramps, no varicose veins, no DVT, no non-healing foot ulcer  Neuro:   no stroke, no TIA's, no seizures, no headaches, no temporary blindness one eye,  no slurred speech, no peripheral neuropathy, no chronic pain, no instability of gait, no memory/cognitive dysfunction  Musculoskeletal: no arthritis, + joint swelling, no myalgias, no difficulty walking, normal mobility   Skin:   no rash, no itching, no skin infections, no pressure sores or ulcerations  Psych:   no anxiety, no depression, no nervousness, + unusual recent stress  Eyes:   no blurry vision, no floaters, no recent vision changes, + wears glasses or contacts  ENT:   no hearing loss, + loose or painful teeth, no dentures, last saw dentist 1 year ago  Hematologic:  no easy bruising, no abnormal bleeding, no clotting disorder, no frequent epistaxis  Endocrine:  no diabetes, does not check CBG's at home     Physical Exam:   BP (!)  163/122 (BP Location: Left Arm, Patient Position: Sitting, Cuff Size: Large)   Pulse 77   Resp 16   Ht 5\' 10"  (1.778 m)   Wt 225 lb (102.1 kg)   SpO2 98% Comment: ON RA  BMI 32.28 kg/m   General:  Well-appearing AA male NAD  HEENT:  Unremarkable   Neck:   no JVD, no bruits, no adenopathy   Chest:   clear to auscultation, symmetrical breath sounds, no wheezes, no rhonchi   CV:   RRR, grade III/VI blowing holosystolic murmur best LLSB  Abdomen:  soft, non-tender, no masses   Extremities:  warm, well-perfused, pulses diminished but palpable, mild LE edema  Rectal/GU  Deferred  Neuro:   Grossly non-focal and symmetrical throughout  Skin:   Clean and dry, no rashes, no breakdown   Diagnostic Tests:  Transthoracic Echocardiography  Patient:    Vernon Blackburn, Vernon Blackburn MR #:       [id number] Study Date: 01/03/2016 Gender:     M Age:        88 Height:     177.8 cm Weight:     108.9 kg BSA:        2.36 m^2 Pt. Status: Room:       [place], [place]  SONOGRAPHER  [address]  ADMITTING    [place] [id number]  [id number]     [id number], ZESPQZR [id number]  [id number]    WLSLHTD, SKAJGOT [id number]  PERFORMING   [facility], Inpatient  cc:  ------------------------------------------------------------------- LV EF: 20% -   25%  ------------------------------------------------------------------- Indications:      CHF - 428.0.  ------------------------------------------------------------------- History:   PMH:  DOE. CKD. Accelerated hypertension. Asthma. Anemia.  ------------------------------------------------------------------- Study Conclusions  - Left ventricle: The cavity size was mildly dilated. There was   mild concentric hypertrophy. Systolic function was severely   reduced. The estimated ejection fraction was in the range of 20%   to 25%. Wall motion was normal; there were no regional wall   motion abnormalities. - Aortic root: The aortic root was  mildly dilated. - Mitral valve: There was severe regurgitation directed   eccentrically and toward the free wall. - Left atrium: The atrium was severely dilated. - Right atrium: The atrium was mildly to moderately dilated. - Tricuspid valve: There was moderate regurgitation. - Pulmonary arteries: Systolic pressure was moderately increased.   PA peak pressure: 52 mm Hg (S).  ------------------------------------------------------------------- Study data:  No prior study was available for comparison.  Study status:  Routine.  Procedure:  The patient reported no pain pre or post test. Transthoracic echocardiography. Image quality was adequate.  Study completion:  There were no complications. Transthoracic echocardiography.  M-mode, complete 2D, spectral Doppler, and color Doppler.  Birthdate:  Patient birthdate: 1965-01-17.  Age:  Patient is 51 yr old.  Sex:  Gender: male. BMI: 34.5 kg/m^2.  Blood pressure:     125/90  Patient status: Inpatient.  Study date:  Study date: 01/03/2016. Study time: 09:22 AM.  Location:  Bedside.  -------------------------------------------------------------------  ------------------------------------------------------------------- Left ventricle:  The cavity size was mildly dilated. There was mild concentric hypertrophy. Systolic function was severely reduced. The estimated ejection fraction was in the range of 20% to 25%. Wall motion was normal; there were no regional wall motion abnormalities.  ------------------------------------------------------------------- Aortic valve:   Trileaflet; normal thickness leaflets. Mobility was not restricted.  Doppler:  Transvalvular velocity was within the normal range. There was no stenosis. There was no regurgitation.   ------------------------------------------------------------------- Aorta:  Aortic root: The aortic root was mildly  dilated.  ------------------------------------------------------------------- Mitral valve:   Structurally normal valve.   Mobility was not restricted.  Doppler:  Transvalvular velocity was within the normal range. There was no evidence for stenosis. There was severe regurgitation directed eccentrically and toward the free wall. Peak gradient (D): 4 mm Hg.  ------------------------------------------------------------------- Left atrium:  The atrium was severely dilated.  ------------------------------------------------------------------- Right ventricle:  The cavity size was normal. Wall thickness was normal. Systolic function was normal.  ------------------------------------------------------------------- Pulmonic valve:    Doppler:  Transvalvular velocity was within the normal range. There  was no evidence for stenosis.  ------------------------------------------------------------------- Tricuspid valve:   Structurally normal valve.    Doppler: Transvalvular velocity was within the normal range. There was moderate regurgitation.  ------------------------------------------------------------------- Pulmonary artery:   The main pulmonary artery was normal-sized. Systolic pressure was moderately increased.  ------------------------------------------------------------------- Right atrium:  The atrium was mildly to moderately dilated.  ------------------------------------------------------------------- Pericardium:  There was no pericardial effusion.  ------------------------------------------------------------------- Systemic veins: Inferior vena cava: The vessel was normal in size.  ------------------------------------------------------------------- Measurements   Left ventricle                           Value        Reference  LV ID, ED, PLAX chordal          (H)     67    mm     43 - 52  LV ID, ES, PLAX chordal          (H)     61.9  mm     23 - 38  LV fx  shortening, PLAX chordal   (L)     8     %      >=29  LV PW thickness, ED                      11.6  mm     ----------  IVS/LV PW ratio, ED                      0.91         <=1.3  Stroke volume, 2D                        73    ml     ----------  Stroke volume/bsa, 2D                    31    ml/m^2 ----------  LV ejection fraction, 1-p A4C            30    %      ----------  LV end-diastolic volume, 2-p             252   ml     ----------  LV end-systolic volume, 2-p              183   ml     ----------  LV ejection fraction, 2-p                27    %      ----------  Stroke volume, 2-p                       69    ml     ----------  LV end-diastolic volume/bsa, 2-p         107   ml/m^2 ----------  LV end-systolic volume/bsa, 2-p          78    ml/m^2 ----------  Stroke volume/bsa, 2-p                   29.3  ml/m^2 ----------  LV e&', lateral                           10.3  cm/s   ----------  LV E/e&', lateral  9.7          ----------  LV e&', medial                            5.4   cm/s   ----------  LV E/e&', medial                          18.5         ----------  LV e&', average                           7.85  cm/s   ----------  LV E/e&', average                         12.73        ----------    Ventricular septum                       Value        Reference  IVS thickness, ED                        10.5  mm     ----------    LVOT                                     Value        Reference  LVOT ID, S                               27    mm     ----------  LVOT area                                5.73  cm^2   ----------  LVOT peak velocity, S                    69.5  cm/s   ----------  LVOT mean velocity, S                    50.8  cm/s   ----------  LVOT VTI, S                              12.7  cm     ----------    Aorta                                    Value        Reference  Aortic root ID, ED                       38    mm     ----------    Left  atrium                              Value        Reference  LA ID, A-P, ES  49    mm     ----------  LA ID/bsa, A-P                           2.08  cm/m^2 <=2.2  LA volume, S                             235   ml     ----------  LA volume/bsa, S                         99.7  ml/m^2 ----------  LA volume, ES, 1-p A4C                   221   ml     ----------  LA volume/bsa, ES, 1-p A4C               93.8  ml/m^2 ----------  LA volume, ES, 1-p A2C                   243   ml     ----------  LA volume/bsa, ES, 1-p A2C               103.1 ml/m^2 ----------    Mitral valve                             Value        Reference  Mitral E-wave peak velocity              99.9  cm/s   ----------  Mitral A-wave peak velocity              40.3  cm/s   ----------  Mitral deceleration time         (L)     148   ms     150 - 230  Mitral peak gradient, D                  4     mm Hg  ----------  Mitral E/A ratio, peak                   2.5          ----------  Mitral regurg VTI, PISA                  154   cm     ----------  Mitral ERO, PISA                         0.4   cm^2   ----------  Mitral regurg volume, PISA               62    ml     ----------    Pulmonary arteries                       Value        Reference  PA pressure, S, DP               (H)     52    mm Hg  <=30    Tricuspid valve  Value        Reference  Tricuspid regurg peak velocity           305   cm/s   ----------  Tricuspid peak RV-RA gradient            37    mm Hg  ----------    Right atrium                             Value        Reference  RA ID, S-I, ES, A4C              (H)     70.8  mm     34 - 49  RA area, ES, A4C                 (H)     29.3  cm^2   8.3 - 19.5  RA volume, ES, A/L                       100   ml     ----------  RA volume/bsa, ES, A/L                   42.4  ml/m^2 ----------    Systemic veins                           Value        Reference  Estimated CVP                             15    mm Hg  ----------    Right ventricle                          Value        Reference  TAPSE                                    19.1  mm     ----------  RV pressure, S, DP               (H)     52    mm Hg  <=30  RV s&', lateral, S                        10.6  cm/s   ----------  Legend: (L)  and  (H)  mark values outside specified reference range.  ------------------------------------------------------------------- Prepared and Electronically Authenticated by  Ezzard Standing, MD [facility] 2018-01-01T11:39:30    MYOCARDIAL IMAGING WITH SPECT (REST AND PHARMACOLOGIC-STRESS)  GATED LEFT VENTRICULAR WALL MOTION STUDY  LEFT VENTRICULAR EJECTION FRACTION  TECHNIQUE: Standard myocardial SPECT imaging was performed after resting intravenous injection of 10 mCi Tc-61m tetrofosmin. Subsequently, intravenous infusion of Lexiscan was performed under the supervision of the Cardiology staff. At peak effect of the drug, 30 mCi Tc-49m tetrofosmin was injected intravenously and standard myocardial SPECT imaging was performed. Quantitative gated imaging was also performed to evaluate left ventricular wall motion, and estimate left ventricular ejection fraction.  COMPARISON:  None.  FINDINGS: Perfusion: Large perfusion defects are identified in the septum and inferior walls. No other perfusion abnormalities.  Wall  Motion: No thickening in the region of the large perfusion defects in the septum and inferior wall. Global hypokinesis.  Left Ventricular Ejection Fraction: 33 %  End diastolic volume 161 ml  End systolic volume 096 ml  IMPRESSION: 1. Septal and inferior wall infarcts. No reversible defects to suggest ischemia.  2. Global hypokinesis. No wall thickening in the region of the septal or inferior wall infarcts.  3. Left ventricular ejection fraction 33%  4. Non invasive risk stratification*: High  *2012 Appropriate Use Criteria  for Coronary Revascularization Focused Update: J Am Coll Cardiol. 0454;09(8):119-147. http://content.airportbarriers.com.aspx?articleid=1201161   Electronically Signed   By: Dorise Bullion III M.D   On: 01/06/2016 13:09    Transthoracic Echocardiography  Patient:    Keno, Caraway MR #:       [id number] Study Date: 04/21/2016 Gender:     M Age:        83 Height:     177.8 cm Weight:     102.5 kg BSA:        2.28 m^2 Pt. Status: Room:   ATTENDING    Fransico Him, MD  ORDERING     Fransico Him, MD  [place], MD  SONOGRAPHER  Victorio Palm, RDCS  PERFORMING   [facility], Outpatient  cc:  ------------------------------------------------------------------- LV EF: 20% -   25%  ------------------------------------------------------------------- Indications:      (I50.22).  (I42.0).  ------------------------------------------------------------------- History:   PMH:  Acquired from the patient and from the patient&'s chart.  Bilateral lower extremity edema.  Dilated cardiomyopathy, with congestive heart failure, with an ejection fraction of 26%by echocardiography. The dysfunction is both systolic and diastolic. Severe mitral regurgitation.  Moderate tricuspid regurgitation. Renal disease.  Primary pulmonary hypertension.  ------------------------------------------------------------------- Study Conclusions  - Left ventricle: The cavity size was severely dilated. Wall   thickness was normal. Systolic function was severely reduced. The   estimated ejection fraction was in the range of 20% to 25%.   Diffuse hypokinesis. The study is not technically sufficient to   allow evaluation of LV diastolic function. - Aortic valve: There was mild regurgitation. - Mitral valve: There was severe regurgitation directed   eccentrically and posteriorly. - Left atrium: The atrium was severely dilated. - Right ventricle: Systolic function was moderately  reduced. - Right atrium: The atrium was mildly dilated. - Tricuspid valve: There was mild-moderate regurgitation. - Pulmonary arteries: Systolic pressure was severely increased. PA   peak pressure: 80 mm Hg (S). - Pericardium, extracardiac: A trivial pericardial effusion was   identified.  Impressions:  - Severe global reduction in LV function; severe LVE; mildly   thickened aortic valve with mild AI; probable prolapse of   anterior MV leaflet with severe, eccentric MR; severe LAE; mild   RAE; moderately reduced RV function; mild to moderate TR;   severely elevated pulmonary pressure; suggest TEE to better   assess MR. Note, pt developed tachycardia (HR 120) during the   study that converted to sinus prior to completion; consider   holter monitor.  ------------------------------------------------------------------- Labs, prior tests, procedures, and surgery: Echocardiography (February 2018).    The mitral valve showed severe regurgitation. The tricuspid valve showed moderate regurgitation. EF was 25%.  Gastric bypass (2014).  ------------------------------------------------------------------- Study data:  Comparison was made to the study of February 2018. Study status:  Routine.  Procedure:  The patient reported no pain pre or post test. Transthoracic echocardiography for left ventricular function evaluation, for right ventricular function evaluation, for assessment of valvular  function, and for evaluation of pulmonary pressures. Image quality was adequate.  Study completion:  There were no complications.          Transthoracic echocardiography.  M-mode, complete 2D, spectral Doppler, and color Doppler.  Birthdate:  Patient birthdate: 03-12-1965.  Age:  Patient is 51 yr old.  Sex:  Gender: male.    BMI: 32.4 kg/m^2.  Blood pressure:     128/78  Patient status:  Outpatient.  Study date: Study date: 04/21/2016. Study time: 07:28 AM.  Location:  [facility]  3  -------------------------------------------------------------------  ------------------------------------------------------------------- Left ventricle:  The cavity size was severely dilated. Wall thickness was normal. Systolic function was severely reduced. The estimated ejection fraction was in the range of 20% to 25%. Diffuse hypokinesis. The study is not technically sufficient to allow evaluation of LV diastolic function.  ------------------------------------------------------------------- Aortic valve:   Trileaflet; mildly thickened leaflets. Cusp separation was normal.  Doppler:  Transvalvular velocity was within the normal range. There was no stenosis. There was mild regurgitation.  ------------------------------------------------------------------- Aorta:  Aortic root: The aortic root was normal in size.  ------------------------------------------------------------------- Mitral valve:   Structurally normal valve.   Leaflet separation was normal.  Doppler:  Transvalvular velocity was within the normal range. There was no evidence for stenosis. There was severe regurgitation directed eccentrically and posteriorly.  ------------------------------------------------------------------- Left atrium:  The atrium was severely dilated.  ------------------------------------------------------------------- Right ventricle:  The cavity size was normal. Systolic function was moderately reduced.  ------------------------------------------------------------------- Pulmonic valve:    Structurally normal valve.   Cusp separation was normal.  Doppler:  Transvalvular velocity was within the normal range. There was mild regurgitation.  ------------------------------------------------------------------- Tricuspid valve:   Structurally normal valve.   Leaflet separation was normal.  Doppler:  Transvalvular velocity was within the normal range. There was mild-moderate  regurgitation.  ------------------------------------------------------------------- Pulmonary artery:   Systolic pressure was severely increased.  ------------------------------------------------------------------- Right atrium:  The atrium was mildly dilated.  ------------------------------------------------------------------- Pericardium:  A trivial pericardial effusion was identified.  ------------------------------------------------------------------- Systemic veins: Inferior vena cava: The vessel was dilated.  ------------------------------------------------------------------- Measurements   Left ventricle                         Value        Reference  LV ID, ED, PLAX chordal        (H)     74.9  mm     43 - 52  LV ID, ES, PLAX chordal        (H)     65.4  mm     23 - 38  LV fx shortening, PLAX chordal (L)     13    %      >=29  LV PW thickness, ED                    11.2  mm     ---------  IVS/LV PW ratio, ED                    1            <=1.3  Stroke volume, 2D                      48    ml     ---------  Stroke volume/bsa, 2D                  21  ml/m^2 ---------  LV e&', medial                          4.68  cm/s   ---------    Ventricular septum                     Value        Reference  IVS thickness, ED                      11.2  mm     ---------    LVOT                                   Value        Reference  LVOT ID, S                             27    mm     ---------  LVOT area                              5.73  cm^2   ---------  LVOT ID                                27    mm     ---------  LVOT peak velocity, S                  71.7  cm/s   ---------  LVOT mean velocity, S                  46.2  cm/s   ---------  LVOT VTI, S                            8.35  cm     ---------  Stroke volume (SV), LVOT DP            47.8  ml     ---------  Stroke index (SV/bsa), LVOT DP         21    ml/m^2 ---------    Aorta                                  Value         Reference  Aortic root ID, ED                     33    mm     ---------  Ascending aorta ID, A-P, S             36    mm     ---------    Left atrium                            Value        Reference  LA ID, A-P, ES                         56    mm     ---------  LA ID/bsa, A-P                 (H)     2.45  cm/m^2 <=2.2  LA volume, S                           124   ml     ---------  LA volume/bsa, S                       54.3  ml/m^2 ---------  LA volume, ES, 1-p A4C                 118   ml     ---------  LA volume/bsa, ES, 1-p A4C             51.7  ml/m^2 ---------  LA volume, ES, 1-p A2C                 117   ml     ---------  LA volume/bsa, ES, 1-p A2C             51.3  ml/m^2 ---------    Mitral valve                           Value        Reference  Aliasing velocity, MR PISA             31.3  cm/s   ---------  Mitral regurg PISA radius              1.3   mm     ---------  Mitral regurg VTI, PISA                148   cm     ---------  Mitral ERO, PISA                       0.61  cm^2   ---------  Mitral regurg volume, PISA             90    ml     ---------  Mitral regurg fraction, PISA           65.31 %      ---------    Pulmonary arteries                     Value        Reference  PA pressure, S, DP             (H)     80    mm Hg  <=30    Tricuspid valve                        Value        Reference  Tricuspid regurg peak velocity         402   cm/s   ---------  Tricuspid peak RV-RA gradient          65    mm Hg  ---------    Systemic veins                         Value        Reference  Estimated CVP  15    mm Hg  ---------    Right ventricle                        Value        Reference  TAPSE                                  18.3  mm     ---------  RV pressure, S, DP             (H)     80    mm Hg  <=30  RV s&', lateral, S                      6.2   cm/s   ---------    Pulmonic valve                         Value        Reference   Pulmonic regurg velocity, ED           229   cm/s   ---------  Pulmonic regurg gradient, ED           21    mm Hg  ---------  Legend: (L)  and  (H)  mark values outside specified reference range.  ------------------------------------------------------------------- Prepared and Electronically Authenticated by  Kirk Ruths 2018-04-20T12:39:30   Procedure: TEE  Indication: Mitral regurgitation.   Sedation: 7 mg IV Versed, 50 mcg IV Fentanyl.  Patient received hydralazine 40 mg IV total for elevated BP.   Findings: Please see echo section for full report.  The left ventricle was moderately dilated with normal wall thickness.  Severe global hypokinesis, estimate EF 25-30%.  Normal RV size with mildly decreased systolic function.  Moderate left atrial enlargement, no LAA thrombus.  Mild right atrial enlargement.  Mild TR with peak RV-RA gradient 55 mmHg.  Trileaflet aortic valve with trivial AI, no aortic stenosis.  The mitral valve was mildly thickened especially towards the tip of the anterior leaflet.  There was inadequate coaptation of the leaflets with severe posterioly-directed mitral regurgitation.  ERO 0.52 cm^2.  There was systolic flow reversal on the pulmonary vein doppler pattern.  The aorta was normal in caliber with mild plaque in the descending thoracic aorta.  No ASD/PFO, bubble study negative.    Impression: Severe mitral regurgitation.  Suspect there is a component of secondary MR from dilated LV, but there is thickening of the leaflets, possible primary MR component as well.    Loralie Champagne 05/04/2016 9:43 AM   Impression:  Patient has stage D severe symptomatic mitral regurgitation. He originally presented with acute combined systolic and diastolic congestive heart failure with class IV symptoms several months ago in the setting of poorly controlled hypertension and chronic kidney disease. He has done reasonably well on medical therapy and currently remains quite  stable with class II symptoms of exertional shortness of breath and mild lower extremity edema. However, despite optimal medical therapy recent transesophageal echocardiogram reveals persistent severe mitral regurgitation in the setting of severe left ventricular systolic dysfunction. I personally reviewed the patient's transthoracic and transesophageal echocardiograms. There is severe global left ventricular systolic dysfunction. Previous nuclear stress imaging suggested the possibility of previous septal and inferior wall myocardial infarctions without evidence for ischemia.  EKG performed at the time of his  original presentation revealed sinus rhythm with low voltage across the precordial leads but no Q waves nor other definite findings to suggest possibility the patient may have had previous myocardial infarction. He has been treated for presumed nonischemic cardiomyopathy and remains stable clinically.  On careful review of the patient's recent transesophageal echocardiogram, functional anatomy of the mitral valve appears potentially mixed. For the most part the leaflets appear reasonably thin and mobile. There is no prolapse. The mitral annulus is dilated. This could be primarily a combination of type I and/or type IIIB dysfunction related to the patient's underlying ventricular disease. However, restriction of the posterior leaflet seems overly pronounced despite reasonable posterior lateral wall contractility, so the possibility of intrinsic valvular pathology seems reasonable, particularly given the fact the patient states that he was told he had a heart murmur many years ago.  Overall I agree the patient may have a combination of both primary and secondary mitral regurgitation.  Even if his mitral regurgitation is purely secondary, he still has symptoms of exertional shortness of breath and severe mitral regurgitation despite optimal medical therapy. I agree he would likely benefit from mitral valve  repair. The possibility of ischemic heart disease needs to be definitively ruled out. Assuming this is not the case the patient might be candidate for minimally invasive approach for surgery.   Plan:  The patient was counseled at length regarding the indications, risks and potential benefits of mitral valve repair.  The rationale for elective surgery has been explained, including a comparison between surgery and continued medical therapy with close follow-up.  Alternative surgical approaches have been discussed including a comparison between conventional sternotomy and minimally-invasive techniques.  As a next step the patient will need to undergo left and right heart catheterization. If the patient does not have significant coronary artery disease he will then need CT angiography of the aorta and iliac vessels of he is to be considered candidate for minimally invasive approach for surgery.  Given his underlying renal disease care will need to be taken to minimize risk of contrast-induced nephropathy.  The patient will return in approximately 3 weeks to review the results of these tests. During the interim period of time he will see his local dentist for clearance prior to surgery.   I spent in excess of 90 minutes during the conduct of this office consultation and >50% of this time involved direct face-to-face encounter with the patient for counseling and/or coordination of their care.    [name], MD 05/25/2016 3:31 PM

## 2016-05-25 NOTE — Patient Instructions (Signed)
Continue all previous medications without any changes at this time  

## 2016-05-25 NOTE — Progress Notes (Signed)
Heather/Megan/Susie:  Please make sure that Vernon Blackburn has an appointment with me in about a week or less. I am going to need to set him up for right and left heart cath before his next visit with Dr. Roxy Manns.  Will need to pre-hydrate and hold Lasix & valsartan, so need to discuss with him.

## 2016-05-30 ENCOUNTER — Ambulatory Visit (HOSPITAL_COMMUNITY)
Admission: RE | Admit: 2016-05-30 | Discharge: 2016-05-30 | Disposition: A | Discharge status: Home / Self Care | Payer: Medicaid Other | Source: Ambulatory Visit | Attending: Cardiology | Admitting: Cardiology

## 2016-05-30 ENCOUNTER — Encounter (HOSPITAL_COMMUNITY): Payer: Self-pay | Admitting: *Deleted

## 2016-05-30 ENCOUNTER — Encounter (HOSPITAL_COMMUNITY): Payer: Self-pay

## 2016-05-30 VITALS — BP 152/98 | HR 89 | Wt 213.5 lb

## 2016-05-30 DIAGNOSIS — N184 Chronic kidney disease, stage 4 (severe): Secondary | ICD-10-CM

## 2016-05-30 DIAGNOSIS — I420 Dilated cardiomyopathy: Secondary | ICD-10-CM | POA: Diagnosis not present

## 2016-05-30 DIAGNOSIS — I5022 Chronic systolic (congestive) heart failure: Secondary | ICD-10-CM

## 2016-05-30 DIAGNOSIS — I130 Hypertensive heart and chronic kidney disease with heart failure and stage 1 through stage 4 chronic kidney disease, or unspecified chronic kidney disease: Secondary | ICD-10-CM | POA: Insufficient documentation

## 2016-05-30 DIAGNOSIS — I051 Rheumatic mitral insufficiency: Secondary | ICD-10-CM | POA: Diagnosis not present

## 2016-05-30 DIAGNOSIS — I340 Nonrheumatic mitral (valve) insufficiency: Secondary | ICD-10-CM

## 2016-05-30 DIAGNOSIS — M109 Gout, unspecified: Secondary | ICD-10-CM | POA: Insufficient documentation

## 2016-05-30 LAB — BASIC METABOLIC PANEL
Anion gap: 9 (ref 5–15)
BUN: 46 mg/dL — ABNORMAL HIGH (ref 6–20)
CO2: 23 mmol/L (ref 22–32)
Calcium: 8.5 mg/dL — ABNORMAL LOW (ref 8.9–10.3)
Chloride: 108 mmol/L (ref 101–111)
Creatinine, Ser: 2.55 mg/dL — ABNORMAL HIGH (ref 0.61–1.24)
GFR calc Af Amer: 32 mL/min — ABNORMAL LOW (ref >60)
GFR calc non Af Amer: 28 mL/min — ABNORMAL LOW (ref >60)
Glucose, Bld: 84 mg/dL (ref 65–99)
Potassium: 3.9 mmol/L (ref 3.5–5.1)
Sodium: 140 mmol/L (ref 135–145)

## 2016-05-30 LAB — CBC
HCT: 33.3 % — ABNORMAL LOW (ref 39.0–52.0)
Hemoglobin: 10.3 g/dL — ABNORMAL LOW (ref 13.0–17.0)
MCH: 24.3 pg — ABNORMAL LOW (ref 26.0–34.0)
MCHC: 30.9 g/dL (ref 30.0–36.0)
MCV: 78.5 fL (ref 78.0–100.0)
Platelets: 275 10*3/uL (ref 150–400)
RBC: 4.24 MIL/uL (ref 4.22–5.81)
RDW: 17.5 % — ABNORMAL HIGH (ref 11.5–15.5)
WBC: 7.7 10*3/uL (ref 4.0–10.5)

## 2016-05-30 LAB — PROTIME-INR
INR: 1.1
Prothrombin Time: 14.2 seconds (ref 11.4–15.2)

## 2016-05-30 LAB — BRAIN NATRIURETIC PEPTIDE: B Natriuretic Peptide: 783.7 pg/mL — ABNORMAL HIGH (ref 0.0–100.0)

## 2016-05-30 MED ORDER — ISOSORBIDE MONONITRATE ER 60 MG PO TB24: 60.0000 mg | ORAL_TABLET | Freq: Every day | ORAL | 3 refills | Status: DC

## 2016-05-30 MED ORDER — HYDRALAZINE HCL 100 MG PO TABS: 100.0000 mg | ORAL_TABLET | Freq: Three times a day (TID) | ORAL | 3 refills | Status: DC

## 2016-05-30 NOTE — Patient Instructions (Signed)
Increase Hydralazine to 100 mg Three times a day   Increase Imdur to 60 mg daily  Labs today  Heart Catheterization on Tue 6/5, see instruction sheet  Your physician recommends that you schedule a follow-up appointment in: 2-3 weeks

## 2016-05-31 ENCOUNTER — Other Ambulatory Visit (HOSPITAL_COMMUNITY): Payer: Self-pay | Admitting: *Deleted

## 2016-05-31 DIAGNOSIS — I340 Nonrheumatic mitral (valve) insufficiency: Secondary | ICD-10-CM

## 2016-05-31 DIAGNOSIS — I5022 Chronic systolic (congestive) heart failure: Secondary | ICD-10-CM

## 2016-05-31 NOTE — Progress Notes (Signed)
PCP: Dr. Delena Bali Cardiology: Dr. Radford Pax HF Cardiology: [name]  51 yo with history of long-standing, poorly-controlled HTN, CKD stage 3-4, chronic systolic CHF and severe mitral regurgitation.  He has had hypertension for years.  He has had known CKD, followed by Dr. [name].  He developed severe dyspnea in 12/17 and was admitted with acute systolic CHF.  Echo showed EF 20-25% with severe MR.  He was diuresed and discharged.   TEE was done in 5/18 to assess mitral valve.  There was severe MR.  The MV was thickened and did not coapt well.  Suspect there is a component of secondary MR with moderate LV dilation, however the thickened leaflets suggested a component of primary MR as well.   At last appointment, I adjusted his medications.  Today, he is feeling better.  He is able to walk 1.5 miles with mild dyspnea. OK with a flight of steps.  No orthopnea/PND.  No lightheadedness.  He saw Dr. Roxy Manns for evaluation for MV repair.  BP is still high.  Weight down 12 lbs with increased Lasix.   ECG (12/17): NSR, diffuse nonspecific T wave changes (QRS is not wide).   Labs (4/18): K 3.7, creatinine 3.05, hgb 10.4 Labs (5/18): K 3.3, creatinine 2.89, BNP 789  PMH: 1. Gout 2. HTN: Long-standing, poor control. 3. CKD: Stage 3-4, followed by Dr. [name].  Likely hypertensive nephropathy.  4. Dilated cardiomyopathy: May be due to long-standing HTN.  - Echo (1/18): EF 20-25%, severe MR - Echo (2/18): EF 25%, severe MR.  - TEE (5/18): Moderate LV dilation with severe global hypokinesis, EF 25-30%, normal RV size with mildly decreased systolic function, RV-RA gradient 55 mmHg, severe MR with mildly thickened leaflets with inadequate coaptation and ERO 0.52 cm^2 by PISA and systolic flow reversal in the pulmonary vein doppler pattern => secondary MR from dilated LV but thickened leaflets lead to concern for component of primary MR as well.  5. Severe MR: See TEE report above.   SH: Married, lives in [place],  nonsmoker, no drugs, occasional ETOH.  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   Family History  Problem Relation Age of Onset  . Diabetes Mother   . Hypertension Mother   . Heart failure Mother    ROS: All systems reviewed and negative except as per HPI.   Current Outpatient Prescriptions  Medication Sig Dispense Refill  . albuterol (PROVENTIL HFA;VENTOLIN HFA) 108 (90 Base) MCG/ACT inhaler Inhale 2 puffs into the lungs every 6 (six) hours as needed for wheezing or shortness of breath.    Marland Kitchen albuterol (PROVENTIL) (5 MG/ML) 0.5% nebulizer solution Take 2.5 mg by nebulization every 6 (six) hours as needed for wheezing or shortness of breath.    . carvedilol (COREG) 25 MG tablet Take 1 tablet (25 mg total) by mouth 2 (two) times daily. 180 tablet 3  . FeAspGl-FeFum-B12-FA-C-Succ Ac (FERREX 28 PO) Take 1 tablet by mouth 2 (two) times daily.    . Febuxostat (ULORIC) 80 MG TABS Take 1 tablet by mouth daily.    . Fluticasone-Salmeterol (ADVAIR) 500-50 MCG/DOSE AEPB Inhale 1 puff into the lungs 2 (two) times daily.    . furosemide (LASIX) 40 MG tablet Take 2 tablets in the AM and 1 tablet in the PM 90 tablet 3  . hydrALAZINE (APRESOLINE) 100 MG tablet Take 1 tablet (100 mg total) by mouth 3 (three) times daily. 90 tablet 3  . isosorbide mononitrate (  IMDUR) 60 MG 24 hr tablet Take 1 tablet (60 mg total) by mouth daily. 30 tablet 3  . lisdexamfetamine (VYVANSE) 60 MG capsule Take 60 mg by mouth every morning.    . potassium chloride (K-DUR) 10 MEQ tablet Take 1 tablet (10 mEq total) by mouth daily. 30 tablet 3  . valsartan (DIOVAN) 160 MG tablet Take 1 tablet (160 mg total) by mouth daily. 30 tablet 11   No current facility-administered medications for this encounter.    BP (!) 152/98   Pulse 89   Wt 213 lb 8 oz (96.8 kg)   SpO2 99%   BMI 30.63 kg/m  General: NAD Neck: JVP 8 cm, no thyromegaly or thyroid nodule.  Lungs: Clear to  auscultation bilaterally with normal respiratory effort. CV: Nondisplaced PMI.  Heart regular S1/S2, no S3/S4, 3/6 HSM apex.  1+ edema to knees, L>R.  No carotid bruit.  Normal pedal pulses.  Abdomen: Soft, nontender, no hepatosplenomegaly, no distention.  Skin: Intact without lesions or rashes.  Neurologic: Alert and oriented x 3.  Psych: Normal affect. Extremities: No clubbing or cyanosis.  HEENT: Normal.   Assessment/Plan: 1. HTN: BP remains high today.  Possible hypertensive cardiomyopathy and hypertensive nephropathy.  Needs good BP control.  - Increase hydralazine to 100 mg tid and increase Imdur to 60 mg daily.   2. CKD: Stage 4.  Followed by nephrology. Needs BMET today.  3. Chronic systolic CHF: EF 75-64% with moderate LV dilation and diffuse LV hypokinesis on TEE in 5/18. Cause of cardiomyopathy may be long-standing, poorly controlled HTN.  He has not had an ischemic workup.  NYHA class II symptoms.  Mild volume overload, improved.  Weight down 12 lbs.   - Continue Lasix 80 qam/40 qpm.  BMET/BNP today.   - Increase hydralazine to 100 mg tid and Imdur to 60 mg daily.  - Continue Coreg 25 mg bid.  - Continue current valsartan as long as creatinine remains stable.  4. Mitral regurgitation: Severe.  See TEE report above.  I suspect there is a component of functional MR from dilated LV, but MV leaflets are thickened, and I am concerned for a component of primary MR as well.  He has seen Dr. Roxy Manns, and given the suspected component of primary MR, MV repair has been recommended.  - He will need LHC/RHC prior to cath.  Given elevated creatinine, this will be difficult.  Will check BMET today to make sure creatinine has not increased.  If creatinine stable to lower, will arrange for RHC/LHC next week.  He will hold valsartan 2 days prior to cath and Lasix 1 day prior to cath.  BMET on day of cath and gentle hydration with NSR @ 75 cc/hr when he arrives in short stay.  I discussed risks/benefits  with patient today.  He understands the risk to his kidneys from the contrast.  He agrees to proceed.   Followup in 2 wks.   Loralie Champagne 05/31/2016

## 2016-06-06 ENCOUNTER — Encounter (HOSPITAL_COMMUNITY): Payer: Self-pay

## 2016-06-06 ENCOUNTER — Inpatient Hospital Stay (HOSPITAL_COMMUNITY)
Admission: AD | Admit: 2016-06-06 | Discharge: 2016-06-08 | DRG: 286 | Disposition: A | Discharge status: Home / Self Care | Payer: Medicaid Other | Source: Ambulatory Visit | Attending: Cardiology | Admitting: Cardiology

## 2016-06-06 DIAGNOSIS — I130 Hypertensive heart and chronic kidney disease with heart failure and stage 1 through stage 4 chronic kidney disease, or unspecified chronic kidney disease: Principal | ICD-10-CM | POA: Diagnosis present

## 2016-06-06 DIAGNOSIS — I5022 Chronic systolic (congestive) heart failure: Secondary | ICD-10-CM

## 2016-06-06 DIAGNOSIS — I5023 Acute on chronic systolic (congestive) heart failure: Secondary | ICD-10-CM | POA: Diagnosis present

## 2016-06-06 DIAGNOSIS — Z79899 Other long term (current) drug therapy: Secondary | ICD-10-CM

## 2016-06-06 DIAGNOSIS — I420 Dilated cardiomyopathy: Secondary | ICD-10-CM | POA: Diagnosis present

## 2016-06-06 DIAGNOSIS — I509 Heart failure, unspecified: Secondary | ICD-10-CM

## 2016-06-06 DIAGNOSIS — N184 Chronic kidney disease, stage 4 (severe): Secondary | ICD-10-CM

## 2016-06-06 DIAGNOSIS — Z8249 Family history of ischemic heart disease and other diseases of the circulatory system: Secondary | ICD-10-CM

## 2016-06-06 DIAGNOSIS — N179 Acute kidney failure, unspecified: Secondary | ICD-10-CM

## 2016-06-06 DIAGNOSIS — M109 Gout, unspecified: Secondary | ICD-10-CM | POA: Diagnosis present

## 2016-06-06 DIAGNOSIS — Z7982 Long term (current) use of aspirin: Secondary | ICD-10-CM

## 2016-06-06 DIAGNOSIS — I340 Nonrheumatic mitral (valve) insufficiency: Secondary | ICD-10-CM

## 2016-06-06 DIAGNOSIS — I272 Pulmonary hypertension, unspecified: Secondary | ICD-10-CM | POA: Diagnosis present

## 2016-06-06 LAB — CBC
HCT: 32.8 % — ABNORMAL LOW (ref 39.0–52.0)
Hemoglobin: 10.3 g/dL — ABNORMAL LOW (ref 13.0–17.0)
MCH: 24.6 pg — ABNORMAL LOW (ref 26.0–34.0)
MCHC: 31.4 g/dL (ref 30.0–36.0)
MCV: 78.3 fL (ref 78.0–100.0)
Platelets: 265 10*3/uL (ref 150–400)
RBC: 4.19 MIL/uL — ABNORMAL LOW (ref 4.22–5.81)
RDW: 18.1 % — ABNORMAL HIGH (ref 11.5–15.5)
WBC: 6.6 10*3/uL (ref 4.0–10.5)

## 2016-06-06 LAB — BASIC METABOLIC PANEL
Anion gap: 8 (ref 5–15)
BUN: 46 mg/dL — ABNORMAL HIGH (ref 6–20)
CO2: 26 mmol/L (ref 22–32)
Calcium: 8.2 mg/dL — ABNORMAL LOW (ref 8.9–10.3)
Chloride: 108 mmol/L (ref 101–111)
Creatinine, Ser: 2.81 mg/dL — ABNORMAL HIGH (ref 0.61–1.24)
GFR calc Af Amer: 29 mL/min — ABNORMAL LOW (ref >60)
GFR calc non Af Amer: 25 mL/min — ABNORMAL LOW (ref >60)
Glucose, Bld: 97 mg/dL (ref 65–99)
Potassium: 3.9 mmol/L (ref 3.5–5.1)
Sodium: 142 mmol/L (ref 135–145)

## 2016-06-06 LAB — CREATININE, SERUM
Creatinine, Ser: 2.7 mg/dL — ABNORMAL HIGH (ref 0.61–1.24)
GFR calc Af Amer: 30 mL/min — ABNORMAL LOW (ref >60)
GFR calc non Af Amer: 26 mL/min — ABNORMAL LOW (ref >60)

## 2016-06-06 MED ORDER — ASPIRIN EC 81 MG PO TBEC
81.0000 mg | DELAYED_RELEASE_TABLET | Freq: Every day | ORAL | Status: DC
  Administered 2016-06-07: 81 mg via ORAL
  Filled 2016-06-06: qty 1

## 2016-06-06 MED ORDER — SODIUM CHLORIDE 0.9 % IV SOLN
INTRAVENOUS | Status: DC
  Administered 2016-06-06: 08:00:00 via INTRAVENOUS

## 2016-06-06 MED ORDER — ASPIRIN 81 MG PO CHEW
324.0000 mg | CHEWABLE_TABLET | ORAL | Status: AC
  Administered 2016-06-06: 324 mg via ORAL
  Filled 2016-06-06: qty 4

## 2016-06-06 MED ORDER — SODIUM CHLORIDE 0.9% FLUSH: 3.0000 mL | Freq: Two times a day (BID) | INTRAVENOUS | Status: DC

## 2016-06-06 MED ORDER — HYDRALAZINE HCL 50 MG PO TABS
100.0000 mg | ORAL_TABLET | Freq: Three times a day (TID) | ORAL | Status: DC
  Administered 2016-06-06 – 2016-06-07 (×4): 100 mg via ORAL
  Filled 2016-06-06 (×4): qty 2

## 2016-06-06 MED ORDER — ASPIRIN 81 MG PO CHEW: 81.0000 mg | CHEWABLE_TABLET | ORAL | Status: DC

## 2016-06-06 MED ORDER — ONDANSETRON HCL 4 MG/2ML IJ SOLN: 4.0000 mg | Freq: Four times a day (QID) | INTRAMUSCULAR | Status: DC | PRN

## 2016-06-06 MED ORDER — SODIUM CHLORIDE 0.9 % IV SOLN: INTRAVENOUS | Status: DC

## 2016-06-06 MED ORDER — SODIUM CHLORIDE 0.9 % IV SOLN: 250.0000 mL | INTRAVENOUS | Status: DC | PRN

## 2016-06-06 MED ORDER — FEBUXOSTAT 40 MG PO TABS
80.0000 mg | ORAL_TABLET | Freq: Every day | ORAL | Status: DC
  Administered 2016-06-07: 80 mg via ORAL
  Filled 2016-06-06: qty 2

## 2016-06-06 MED ORDER — ISOSORBIDE MONONITRATE ER 60 MG PO TB24
90.0000 mg | ORAL_TABLET | Freq: Every day | ORAL | Status: DC
  Administered 2016-06-07: 90 mg via ORAL
  Filled 2016-06-06 (×2): qty 1

## 2016-06-06 MED ORDER — HEPARIN SODIUM (PORCINE) 5000 UNIT/ML IJ SOLN
5000.0000 [IU] | Freq: Three times a day (TID) | INTRAMUSCULAR | Status: DC
Start: 2016-06-06 — End: 2016-06-08
  Administered 2016-06-06 – 2016-06-08 (×4): 5000 [IU] via SUBCUTANEOUS
  Filled 2016-06-06 (×5): qty 1

## 2016-06-06 MED ORDER — LISDEXAMFETAMINE DIMESYLATE 30 MG PO CAPS
60.0000 mg | ORAL_CAPSULE | Freq: Every day | ORAL | Status: DC
  Filled 2016-06-06: qty 2

## 2016-06-06 MED ORDER — SODIUM CHLORIDE 0.9 % IV SOLN
INTRAVENOUS | Status: DC
Start: 2016-06-06 — End: 2016-06-07
  Administered 2016-06-06 (×2): via INTRAVENOUS

## 2016-06-06 MED ORDER — ALBUTEROL SULFATE (2.5 MG/3ML) 0.083% IN NEBU: 2.5000 mg | INHALATION_SOLUTION | Freq: Four times a day (QID) | RESPIRATORY_TRACT | Status: DC | PRN

## 2016-06-06 MED ORDER — ISOSORBIDE MONONITRATE ER 60 MG PO TB24: 60.0000 mg | ORAL_TABLET | Freq: Every day | ORAL | Status: DC

## 2016-06-06 MED ORDER — SODIUM CHLORIDE 0.9% FLUSH: 3.0000 mL | INTRAVENOUS | Status: DC | PRN

## 2016-06-06 MED ORDER — NITROGLYCERIN 0.4 MG SL SUBL: 0.4000 mg | SUBLINGUAL_TABLET | SUBLINGUAL | Status: DC | PRN

## 2016-06-06 MED ORDER — ACETAMINOPHEN 325 MG PO TABS
650.0000 mg | ORAL_TABLET | ORAL | Status: DC | PRN
  Administered 2016-06-07 – 2016-06-08 (×2): 650 mg via ORAL
  Filled 2016-06-06 (×2): qty 2

## 2016-06-06 MED ORDER — ALBUTEROL SULFATE HFA 108 (90 BASE) MCG/ACT IN AERS: 2.0000 | INHALATION_SPRAY | Freq: Four times a day (QID) | RESPIRATORY_TRACT | Status: DC | PRN

## 2016-06-06 MED ORDER — ASPIRIN 300 MG RE SUPP
300.0000 mg | RECTAL | Status: AC
  Filled 2016-06-06: qty 1

## 2016-06-06 MED ORDER — VITAMIN D 1000 UNITS PO TABS
1000.0000 [IU] | ORAL_TABLET | Freq: Every day | ORAL | Status: DC
  Administered 2016-06-07: 1000 [IU] via ORAL
  Filled 2016-06-06: qty 1

## 2016-06-06 MED ORDER — CARVEDILOL 25 MG PO TABS
25.0000 mg | ORAL_TABLET | Freq: Two times a day (BID) | ORAL | Status: DC
  Administered 2016-06-06 – 2016-06-07 (×3): 25 mg via ORAL
  Filled 2016-06-06 (×4): qty 1

## 2016-06-06 MED ORDER — FE FUMARATE-B12-VIT C-FA-IFC PO CAPS
1.0000 | ORAL_CAPSULE | Freq: Every day | ORAL | Status: DC
  Administered 2016-06-07: 1 via ORAL
  Filled 2016-06-06 (×2): qty 1

## 2016-06-06 MED ORDER — ASPIRIN 81 MG PO CHEW
81.0000 mg | CHEWABLE_TABLET | ORAL | Status: AC
  Administered 2016-06-06: 81 mg via ORAL

## 2016-06-06 MED ORDER — ADULT MULTIVITAMIN W/MINERALS CH
1.0000 | ORAL_TABLET | Freq: Every day | ORAL | Status: DC
  Administered 2016-06-07: 1 via ORAL
  Filled 2016-06-06: qty 1

## 2016-06-06 MED ORDER — ASPIRIN 81 MG PO CHEW
CHEWABLE_TABLET | ORAL | Status: AC
  Filled 2016-06-06: qty 1

## 2016-06-06 NOTE — Progress Notes (Signed)
bp elevated. Denies any headache nor chest pains.night pressure pills given.will continue to monitor

## 2016-06-06 NOTE — Plan of Care (Signed)
Problem: Nutrition: Goal: Adequate nutrition will be maintained Outcome: Progressing Renal Diet

## 2016-06-06 NOTE — Progress Notes (Signed)
Report given to [place], Therapist, sports.  Pt alert and oriented, in no acute distress.  Transported to 6C06 via stretcher.

## 2016-06-06 NOTE — H&P (Signed)
Physician History and Physical    [name] MRN: [id number] DOB/AGE: [birth year] 51 y.o. Admit date: 06/06/2016  PCP: Dr. Delena Bali Cardiology: Dr. Radford Pax HF Cardiology: [name]  HPI: 51 yo with history of long-standing, poorly-controlled HTN, CKD stage 3-4, chronic systolic CHF and severe mitral regurgitation.  He has had hypertension for years.  He has had known CKD, followed by Dr. [name].  He developed severe dyspnea in 12/17 and was admitted with acute systolic CHF.  Echo showed EF 20-25% with severe MR.  He was diuresed and discharged.   TEE was done in 5/18 to assess mitral valve.  There was severe MR.  The MV was thickened and did not coapt well.  Suspect there is a component of secondary MR with moderate LV dilation, however the thickened leaflets suggested a component of primary MR as well.   He was seen by Dr. Roxy Manns for evaluation for mitral valve repair.  We have also been seeing him in the CHF office with medication optimization.  He has been doing better, minimal dyspnea walking on flat ground.  No orthopnea/PND. He was set up today for RHC/LHC prior to MV repair.  Given baseline CKD stage 3-4, we have had a long discussion about the risk of worsening renal function with contrast.  He was supposed to stop valsartan and Lasix starting yesterday but thinks he took both yesterday by accident.  Creatinine today is 2.8, up from 2.5.  Decision was made to defer cath today and hydrate gently overnight with cath tomorrow.   PMH: 1. Gout 2. HTN: Long-standing, poor control. 3. CKD: Stage 3-4, followed by Dr. [name].  Likely hypertensive nephropathy.  4. Dilated cardiomyopathy: May be due to long-standing HTN.  - Echo (1/18): EF 20-25%, severe MR - Echo (2/18): EF 25%, severe MR.  - TEE (5/18): Moderate LV dilation with severe global hypokinesis, EF 25-30%, normal RV size with mildly decreased systolic function, RV-RA gradient 55 mmHg, severe MR with mildly thickened leaflets with  inadequate coaptation and ERO 0.52 cm^2 by PISA and systolic flow reversal in the pulmonary vein doppler pattern => secondary MR from dilated LV but thickened leaflets lead to concern for component of primary MR as well.  5. Severe MR: See TEE report above.   Review of systems complete and found to be negative unless listed above   Family History  Problem Relation Age of Onset  . Diabetes Mother   . Hypertension Mother   . Heart failure Mother     Social History   Social History  . Marital status: Married    Spouse name: N/A  . Number of children: N/A  . Years of education: N/A   Occupational History  . Not on file.   Social History Main Topics  . Smoking status: Never Smoker  . Smokeless tobacco: Never Used  . Alcohol use No  . Drug use: No  . Sexual activity: Not on file   Other Topics Concern  . Not on file   Social History Narrative  . No narrative on file     Prescriptions Prior to Admission  Medication Sig Dispense Refill Last Dose  . albuterol (PROVENTIL HFA;VENTOLIN HFA) 108 (90 Base) MCG/ACT inhaler Inhale 2 puffs into the lungs every 6 (six) hours as needed for wheezing or shortness of breath.   Past Week at Unknown time  . albuterol (PROVENTIL) (5 MG/ML) 0.5% nebulizer solution Take 2.5 mg by nebulization every 6 (six) hours as needed for wheezing  or shortness of breath.   Past Week at Unknown time  . carvedilol (COREG) 25 MG tablet Take 1 tablet (25 mg total) by mouth 2 (two) times daily. 180 tablet 3 06/06/2016 at Unknown time  . cholecalciferol (VITAMIN D) 1000 units tablet Take 1,000 Units by mouth daily.   06/06/2016 at Unknown time  . FeAspGl-FeFum-B12-FA-C-Succ Ac (FERREX 28 PO) Take 1 tablet by mouth daily.    06/06/2016 at Unknown time  . Febuxostat (ULORIC) 80 MG TABS Take 80 mg by mouth daily.    06/06/2016 at Unknown time  . furosemide (LASIX) 40 MG tablet Take 2 tablets in the AM and 1 tablet in the PM 90 tablet 3 06/06/2016 at Unknown time  . hydrALAZINE  (APRESOLINE) 100 MG tablet Take 1 tablet (100 mg total) by mouth 3 (three) times daily. 90 tablet 3 06/06/2016 at Unknown time  . isosorbide mononitrate (IMDUR) 60 MG 24 hr tablet Take 1 tablet (60 mg total) by mouth daily. 30 tablet 3 06/06/2016 at Unknown time  . lisdexamfetamine (VYVANSE) 60 MG capsule Take 60 mg by mouth every morning.   06/06/2016 at Unknown time  . Multiple Vitamin (MULTIVITAMIN WITH MINERALS) TABS tablet Take 1 tablet by mouth daily.   06/06/2016 at Unknown time  . potassium chloride (K-DUR) 10 MEQ tablet Take 1 tablet (10 mEq total) by mouth daily. 30 tablet 3 06/06/2016 at Unknown time  . valsartan (DIOVAN) 160 MG tablet Take 1 tablet (160 mg total) by mouth daily. 30 tablet 11 06/05/2016 at Unknown time    Physical Exam: Blood pressure (!) 153/120, pulse 86, temperature 97.4 F (36.3 C), temperature source Oral, resp. rate 18, height 5\' 10"  (1.778 m), weight 213 lb (96.6 kg), SpO2 97 %.  General: NAD Neck: JVP 8-9 cm, no thyromegaly or thyroid nodule.  Lungs: Clear to auscultation bilaterally with normal respiratory effort. CV: Nondisplaced PMI.  Heart regular S1/S2, no S3/S4, 3/6 HSM apex.  1+ ankle edema bilaterally.  No carotid bruit.  Normal pedal pulses.  Abdomen: Soft, nontender, no hepatosplenomegaly, no distention.  Skin: Intact without lesions or rashes.  Neurologic: Alert and oriented x 3.  Psych: Normal affect. Extremities: No clubbing or cyanosis.  HEENT: Normal.   Labs:   Lab Results  Component Value Date   WBC 7.7 05/30/2016   HGB 10.3 (L) 05/30/2016   HCT 33.3 (L) 05/30/2016   MCV 78.5 05/30/2016   PLT 275 05/30/2016    Recent Labs Lab 06/06/16 0736  NA 142  K 3.9  CL 108  CO2 26  BUN 46*  CREATININE 2.81*  CALCIUM 8.2*  GLUCOSE 97   ASSESSMENT AND PLAN: 51 yo with long-standing, poorly-controlled HTN, CKD stage 3-4, chronic systolic CHF and severe mitral regurgitation. He came today for preop right/left heart cath but creatinine up a bit and  got valsartan and Lasix yesterday.   1. CKD stage 4: Creatinine 2.8 today, up from 2.5.  I have made the decision to admit Mr Vernon Blackburn overnight with gentle hydration, NS @ 75 cc/hr starting at 2 pm today prior to cath tomorrow.  He is at high risk for AKI from contrast nephropathy and I would like to mitigate this risk as much as possible.  We discussed this plan today and he agrees. He saw his nephrologist, Dr. [name], last week.   2. HTN: Possible hypertensive cardiomyopathy and hypertensive nephropathy.  Needs good BP control. - Continue home Coreg, hydralazine, Imdur.  - Will restart valsartan post-cath (hold today and  tomorrow).  3. Chronic systolic CHF: EF 76-16% with moderate LV dilation and diffuse LV hypokinesis on TEE in 5/18. Cause of cardiomyopathy may be long-standing, poorly controlled HTN.  He has not had an ischemic workup.  NYHA class II symptoms.  Mild volume overload, improved overall.   - Hold Lasix today with gentle hydration.  Difficult situation with CKD stage 4, need for cath, and tendency towards volume overload.  Will certainly need diuresis post-cath.  - Continue hydralazine 100 tid, Imdur 60 daily.   - Continue Coreg 25 mg bid.  - Hold valsartan today and tomorrow.   4. Mitral regurgitation: Severe.  See TEE report above.  I suspect there is a component of functional MR from dilated LV, but MV leaflets are thickened, and I am concerned for a component of primary MR as well.  He has seen Dr. Roxy Manns, and given the suspected component of primary MR, MV repair has been recommended.  - He will need LHC/RHC prior to surgery.  I discussed risks/benefits again with patient today.  He understands the risk to his kidneys from the contrast.  He understands the plan to hydrate overnight then cath tomorrow.   Signed: Loralie Champagne 06/06/2016, 9:07 AM

## 2016-06-07 ENCOUNTER — Encounter (HOSPITAL_COMMUNITY)
Admission: AD | Disposition: A | Discharge status: Home / Self Care | Payer: Self-pay | Source: Ambulatory Visit | Attending: Cardiology

## 2016-06-07 DIAGNOSIS — N184 Chronic kidney disease, stage 4 (severe): Secondary | ICD-10-CM | POA: Diagnosis present

## 2016-06-07 DIAGNOSIS — Z7982 Long term (current) use of aspirin: Secondary | ICD-10-CM | POA: Diagnosis not present

## 2016-06-07 DIAGNOSIS — I130 Hypertensive heart and chronic kidney disease with heart failure and stage 1 through stage 4 chronic kidney disease, or unspecified chronic kidney disease: Secondary | ICD-10-CM | POA: Diagnosis present

## 2016-06-07 DIAGNOSIS — I5023 Acute on chronic systolic (congestive) heart failure: Secondary | ICD-10-CM | POA: Diagnosis present

## 2016-06-07 DIAGNOSIS — Z8249 Family history of ischemic heart disease and other diseases of the circulatory system: Secondary | ICD-10-CM | POA: Diagnosis not present

## 2016-06-07 DIAGNOSIS — Z79899 Other long term (current) drug therapy: Secondary | ICD-10-CM | POA: Diagnosis not present

## 2016-06-07 DIAGNOSIS — I429 Cardiomyopathy, unspecified: Secondary | ICD-10-CM | POA: Diagnosis present

## 2016-06-07 DIAGNOSIS — N179 Acute kidney failure, unspecified: Secondary | ICD-10-CM

## 2016-06-07 DIAGNOSIS — M109 Gout, unspecified: Secondary | ICD-10-CM | POA: Diagnosis present

## 2016-06-07 DIAGNOSIS — I420 Dilated cardiomyopathy: Secondary | ICD-10-CM | POA: Diagnosis present

## 2016-06-07 DIAGNOSIS — I340 Nonrheumatic mitral (valve) insufficiency: Secondary | ICD-10-CM | POA: Diagnosis present

## 2016-06-07 HISTORY — PX: RIGHT/LEFT HEART CATH AND CORONARY ANGIOGRAPHY: CATH118266

## 2016-06-07 LAB — BASIC METABOLIC PANEL
Anion gap: 8 (ref 5–15)
BUN: 38 mg/dL — ABNORMAL HIGH (ref 6–20)
CO2: 23 mmol/L (ref 22–32)
Calcium: 8.2 mg/dL — ABNORMAL LOW (ref 8.9–10.3)
Chloride: 111 mmol/L (ref 101–111)
Creatinine, Ser: 2.43 mg/dL — ABNORMAL HIGH (ref 0.61–1.24)
GFR calc Af Amer: 34 mL/min — ABNORMAL LOW (ref >60)
GFR calc non Af Amer: 30 mL/min — ABNORMAL LOW (ref >60)
Glucose, Bld: 93 mg/dL (ref 65–99)
Potassium: 3.9 mmol/L (ref 3.5–5.1)
Sodium: 142 mmol/L (ref 135–145)

## 2016-06-07 LAB — CBC
HCT: 32.4 % — ABNORMAL LOW (ref 39.0–52.0)
Hemoglobin: 10.1 g/dL — ABNORMAL LOW (ref 13.0–17.0)
MCH: 24.4 pg — ABNORMAL LOW (ref 26.0–34.0)
MCHC: 31.2 g/dL (ref 30.0–36.0)
MCV: 78.3 fL (ref 78.0–100.0)
Platelets: 260 10*3/uL (ref 150–400)
RBC: 4.14 MIL/uL — ABNORMAL LOW (ref 4.22–5.81)
RDW: 18.1 % — ABNORMAL HIGH (ref 11.5–15.5)
WBC: 5.1 10*3/uL (ref 4.0–10.5)

## 2016-06-07 LAB — HIV ANTIBODY (ROUTINE TESTING W REFLEX): HIV Screen 4th Generation wRfx: NONREACTIVE

## 2016-06-07 SURGERY — RIGHT/LEFT HEART CATH AND CORONARY ANGIOGRAPHY
Anesthesia: LOCAL

## 2016-06-07 MED ORDER — VERAPAMIL HCL 2.5 MG/ML IV SOLN
INTRAVENOUS | Status: AC
  Filled 2016-06-07: qty 2

## 2016-06-07 MED ORDER — IOPAMIDOL (ISOVUE-370) INJECTION 76%
INTRAVENOUS | Status: AC
  Filled 2016-06-07: qty 100

## 2016-06-07 MED ORDER — HEPARIN SODIUM (PORCINE) 1000 UNIT/ML IJ SOLN
INTRAMUSCULAR | Status: AC
  Filled 2016-06-07: qty 1

## 2016-06-07 MED ORDER — SODIUM CHLORIDE 0.9% FLUSH: 3.0000 mL | INTRAVENOUS | Status: DC | PRN

## 2016-06-07 MED ORDER — SODIUM CHLORIDE 0.9% FLUSH
3.0000 mL | Freq: Two times a day (BID) | INTRAVENOUS | Status: DC
  Administered 2016-06-07: 3 mL via INTRAVENOUS

## 2016-06-07 MED ORDER — FENTANYL CITRATE (PF) 100 MCG/2ML IJ SOLN
INTRAMUSCULAR | Status: DC | PRN
Start: 2016-06-07 — End: 2016-06-07
  Administered 2016-06-07: 25 ug via INTRAVENOUS

## 2016-06-07 MED ORDER — HEPARIN (PORCINE) IN NACL 2-0.9 UNIT/ML-% IJ SOLN
INTRAMUSCULAR | Status: AC
  Filled 2016-06-07: qty 1000

## 2016-06-07 MED ORDER — AMLODIPINE BESYLATE 5 MG PO TABS
5.0000 mg | ORAL_TABLET | Freq: Every day | ORAL | Status: DC
  Administered 2016-06-07: 5 mg via ORAL
  Filled 2016-06-07: qty 1

## 2016-06-07 MED ORDER — SODIUM CHLORIDE 0.9 % IV SOLN
INTRAVENOUS | Status: AC | PRN
  Administered 2016-06-07: 10 mL/h via INTRAVENOUS

## 2016-06-07 MED ORDER — FUROSEMIDE 10 MG/ML IJ SOLN
80.0000 mg | Freq: Three times a day (TID) | INTRAMUSCULAR | Status: DC
  Administered 2016-06-07 – 2016-06-08 (×2): 80 mg via INTRAVENOUS
  Filled 2016-06-07 (×2): qty 8

## 2016-06-07 MED ORDER — HEPARIN (PORCINE) IN NACL 2-0.9 UNIT/ML-% IJ SOLN
INTRAMUSCULAR | Status: AC | PRN
  Administered 2016-06-07: 1000 mL

## 2016-06-07 MED ORDER — ONDANSETRON HCL 4 MG/2ML IJ SOLN: 4.0000 mg | Freq: Four times a day (QID) | INTRAMUSCULAR | Status: DC | PRN

## 2016-06-07 MED ORDER — LIDOCAINE HCL (PF) 1 % IJ SOLN
INTRAMUSCULAR | Status: DC | PRN
  Administered 2016-06-07: 2 mL via INTRADERMAL
  Administered 2016-06-07: 15 mL via INTRADERMAL

## 2016-06-07 MED ORDER — MIDAZOLAM HCL 2 MG/2ML IJ SOLN
INTRAMUSCULAR | Status: DC | PRN
  Administered 2016-06-07: 1 mg via INTRAVENOUS

## 2016-06-07 MED ORDER — LIDOCAINE HCL (PF) 1 % IJ SOLN
INTRAMUSCULAR | Status: AC
  Filled 2016-06-07: qty 30

## 2016-06-07 MED ORDER — MIDAZOLAM HCL 2 MG/2ML IJ SOLN
INTRAMUSCULAR | Status: AC
  Filled 2016-06-07: qty 2

## 2016-06-07 MED ORDER — ACETAMINOPHEN 325 MG PO TABS: 650.0000 mg | ORAL_TABLET | ORAL | Status: DC | PRN

## 2016-06-07 MED ORDER — SODIUM CHLORIDE 0.9 % IV SOLN
250.0000 mL | INTRAVENOUS | Status: DC | PRN
  Administered 2016-06-07: 250 mL via INTRAVENOUS

## 2016-06-07 MED ORDER — FENTANYL CITRATE (PF) 100 MCG/2ML IJ SOLN
INTRAMUSCULAR | Status: AC
  Filled 2016-06-07: qty 2

## 2016-06-07 MED ORDER — HEPARIN SODIUM (PORCINE) 1000 UNIT/ML IJ SOLN
INTRAMUSCULAR | Status: DC | PRN
  Administered 2016-06-07: 5000 [IU] via INTRAVENOUS

## 2016-06-07 MED ORDER — VERAPAMIL HCL 2.5 MG/ML IV SOLN
INTRAVENOUS | Status: DC | PRN
  Administered 2016-06-07: 10 mL via INTRA_ARTERIAL

## 2016-06-07 MED ORDER — HYDRALAZINE HCL 20 MG/ML IJ SOLN
INTRAMUSCULAR | Status: AC
  Filled 2016-06-07: qty 1

## 2016-06-07 MED ORDER — HYDRALAZINE HCL 20 MG/ML IJ SOLN
INTRAMUSCULAR | Status: DC | PRN
  Administered 2016-06-07 (×2): 10 mg via INTRAVENOUS

## 2016-06-07 MED ORDER — IOPAMIDOL (ISOVUE-370) INJECTION 76%
INTRAVENOUS | Status: DC | PRN
  Administered 2016-06-07: 45 mL via INTRA_ARTERIAL

## 2016-06-07 MED ORDER — FUROSEMIDE 10 MG/ML IJ SOLN
INTRAMUSCULAR | Status: DC | PRN
  Administered 2016-06-07: 80 mg via INTRAVENOUS

## 2016-06-07 MED ORDER — FUROSEMIDE 10 MG/ML IJ SOLN
INTRAMUSCULAR | Status: AC
  Filled 2016-06-07: qty 8

## 2016-06-07 SURGICAL SUPPLY — 15 items
CATH BALLN WEDGE 5F 110CM (CATHETERS) ×2 IMPLANT
CATH EXPO 5FR ANG PIGTAIL 145 (CATHETERS) ×2 IMPLANT
CATH INFINITI 5 FR JL3.5 (CATHETERS) ×2 IMPLANT
CATH INFINITI JR4 5F (CATHETERS) ×2 IMPLANT
CATH SWAN GANZ 7F STRAIGHT (CATHETERS) ×2 IMPLANT
DEVICE RAD COMP TR BAND LRG (VASCULAR PRODUCTS) ×2 IMPLANT
GLIDESHEATH SLEND SS 6F .021 (SHEATH) ×2 IMPLANT
GUIDEWIRE INQWIRE 1.5J.035X260 (WIRE) ×1 IMPLANT
INQWIRE 1.5J .035X260CM (WIRE) ×2
KIT HEART LEFT (KITS) ×2 IMPLANT
PACK CARDIAC CATHETERIZATION (CUSTOM PROCEDURE TRAY) ×2 IMPLANT
SHEATH GLIDE SLENDER 4/5FR (SHEATH) ×2 IMPLANT
SHEATH PINNACLE 7F 10CM (SHEATH) ×2 IMPLANT
TRANSDUCER W/STOPCOCK (MISCELLANEOUS) ×2 IMPLANT
TUBING CIL FLEX 10 FLL-RA (TUBING) ×2 IMPLANT

## 2016-06-07 NOTE — Progress Notes (Signed)
Rt radial TR band taken off. No bleeding noted.pulses palpable . Rt hand warm and mobile.applied gauze dsg.

## 2016-06-07 NOTE — Progress Notes (Signed)
Patient ID: Vernon Blackburn, male   DOB: 03/18/1965, 51 y.o.   MRN: [id number]   SUBJECTIVE: No complaints this morning.  BP running high off valsartan.  No dyspnea/chest pain.  Creatinine down to 2.4 today with gentle hydration.   Scheduled Meds: . amLODipine  5 mg Oral Daily  . aspirin EC  81 mg Oral Daily  . carvedilol  25 mg Oral BID  . cholecalciferol  1,000 Units Oral Daily  . febuxostat  80 mg Oral Daily  . ferrous [id number] C-folic acid  1 capsule Oral Daily  . heparin  5,000 Units Subcutaneous Q8H  . hydrALAZINE  100 mg Oral TID  . isosorbide mononitrate  90 mg Oral Daily  . lisdexamfetamine  60 mg Oral Daily  . multivitamin with minerals  1 tablet Oral Daily   Continuous Infusions: . sodium chloride 75 mL/hr at 06/06/16 2030   PRN Meds:.acetaminophen, albuterol, nitroGLYCERIN, ondansetron (ZOFRAN) IV    Vitals:   06/06/16 2130 06/06/16 2215 06/07/16 0022 06/07/16 0602  BP: (!) 150/109 (!) 149/106 (!) 143/105 (!) 157/109  Pulse: 84  75 71  Resp: 17  17 18   Temp: 97.9 F (36.6 C)  98 F (36.7 C) 98.1 F (36.7 C)  TempSrc: Oral  Oral Oral  SpO2: 100%  96% 99%  Weight:    208 lb 6.4 oz (94.5 kg)  Height:        Intake/Output Summary (Last 24 hours) at 06/07/16 0717 Last data filed at 06/07/16 0603  Gross per 24 hour  Intake          1006.25 ml  Output              825 ml  Net           181.25 ml    LABS: Basic Metabolic Panel:  Recent Labs  06/06/16 0736 06/06/16 1157 06/07/16 0442  NA 142  --  142  K 3.9  --  3.9  CL 108  --  111  CO2 26  --  23  GLUCOSE 97  --  93  BUN 46*  --  38*  CREATININE 2.81* 2.70* 2.43*  CALCIUM 8.2*  --  8.2*   Liver Function Tests: No results for input(s): AST, ALT, ALKPHOS, BILITOT, PROT, ALBUMIN in the last 72 hours. No results for input(s): LIPASE, AMYLASE in the last 72 hours. CBC:  Recent Labs  06/06/16 1157 06/07/16 0442  WBC 6.6 5.1  HGB 10.3* 10.1*  HCT 32.8* 32.4*  MCV 78.3 78.3  PLT 265  260   Cardiac Enzymes: No results for input(s): CKTOTAL, CKMB, CKMBINDEX, TROPONINI in the last 72 hours. BNP: Invalid input(s): POCBNP D-Dimer: No results for input(s): DDIMER in the last 72 hours. Hemoglobin A1C: No results for input(s): HGBA1C in the last 72 hours. Fasting Lipid Panel: No results for input(s): CHOL, HDL, LDLCALC, TRIG, CHOLHDL, LDLDIRECT in the last 72 hours. Thyroid Function Tests: No results for input(s): TSH, T4TOTAL, T3FREE, THYROIDAB in the last 72 hours.  Invalid input(s): FREET3 Anemia Panel: No results for input(s): VITAMINB12, FOLATE, FERRITIN, TIBC, IRON, RETICCTPCT in the last 72 hours.  RADIOLOGY: No results found.  PHYSICAL EXAM General: NAD Neck: JVP 9-10 cm, no thyromegaly or thyroid nodule.  Lungs: Clear to auscultation bilaterally with normal respiratory effort. CV: Lateral PMI.  Heart regular S1/S2, no S3/S4, 3/6 HSM apex.  Trace ankle edema.  No carotid bruit.  Normal pedal pulses.  Abdomen: Soft, nontender, no hepatosplenomegaly, no distention.  Neurologic: Alert and oriented x 3.  Psych: Normal affect. Extremities: No clubbing or cyanosis.   TELEMETRY: Personally reviewed telemetry pt in NSR  ASSESSMENT AND PLAN: 51 yo with long-standing, poorly-controlled HTN, CKD stage 3-4, chronic systolic CHF and severe mitral regurgitation. He came yesterday for preop right/left heart cath but creatinine up a bit and got valsartan and Lasix the day prior.   1. CKD stage 4: Creatinine 2.8 yesterday, up from 2.5.  I admitted Vernon Blackburn overnight with gentle hydration, NS @ 75 cc/hr starting prior to cath.  He is at high risk for AKI from contrast nephropathy and I would like to mitigate this risk as much as possible.  Creatinine today down to 2.4 which is likely the lowest we will see.  I will proceed with cath today, trying to minimize contrast.   2. HTN: Possible hypertensive cardiomyopathy and hypertensive nephropathy. Needs good BP control. -  Continue home Coreg, hydralazine, Imdur.  - Will restart valsartan post-cath (probably hold until Friday).  - Add amlodipine 5 mg daily.  3. Chronic systolic CHF: EF 88-91% with moderate LV dilation and diffuse LV hypokinesis on TEE in 5/18. Cause of cardiomyopathy may be long-standing, poorly controlled HTN. He has not had an ischemic workup. NYHA class II symptoms. Mild volume overload, improved overall.  - Holding Laisx with gentle hydration.  Difficult situation with CKD stage 4, need for cath, and tendency towards volume overload.  Will certainly need diuresis post-cath.  - Continue hydralazine 100 tid, Imdur 90 daily.   - Continue Coreg 25 mg bid.  - Hold valsartan today and tomorrow.   4. Mitral regurgitation: Severe. See TEE report above. I suspect there is a component of functional Vernon from dilated LV, but MV leaflets are thickened, and I am concerned for a component of primary Vernon as well. He has seen Dr. Roxy Manns, and given the suspected component of primary Vernon, MV repair has been recommended.  - He will need LHC/RHC prior to surgery. I discussed risks/benefits again with patient again today. He understands the risk to his kidneys from the contrast. Creatinine improved, will proceed today.  Loralie Champagne 06/07/2016 7:21 AM

## 2016-06-07 NOTE — Interval H&P Note (Signed)
History and Physical Interval Note:  06/07/2016 12:31 PM  Vernon Blackburn  has presented today for surgery, with the diagnosis of chf, mitrial regurgetation  The various methods of treatment have been discussed with the patient and family. After consideration of risks, benefits and other options for treatment, the patient has consented to  Procedure(s): Right/Left Heart Cath and Coronary Angiography (N/A) as a surgical intervention .  The patient's history has been reviewed, patient examined, no change in status, stable for surgery.  I have reviewed the patient's chart and labs.  Questions were answered to the patient's satisfaction.     Keishia Ground [employer]

## 2016-06-07 NOTE — H&P (View-Only) (Signed)
Patient ID: LANORRIS KALISZ, male   DOB: 11/03/1965, 51 y.o.   MRN: [id number]   SUBJECTIVE: No complaints this morning.  BP running high off valsartan.  No dyspnea/chest pain.  Creatinine down to 2.4 today with gentle hydration.   Scheduled Meds: . amLODipine  5 mg Oral Daily  . aspirin EC  81 mg Oral Daily  . carvedilol  25 mg Oral BID  . cholecalciferol  1,000 Units Oral Daily  . febuxostat  80 mg Oral Daily  . ferrous [id number] C-folic acid  1 capsule Oral Daily  . heparin  5,000 Units Subcutaneous Q8H  . hydrALAZINE  100 mg Oral TID  . isosorbide mononitrate  90 mg Oral Daily  . lisdexamfetamine  60 mg Oral Daily  . multivitamin with minerals  1 tablet Oral Daily   Continuous Infusions: . sodium chloride 75 mL/hr at 06/06/16 2030   PRN Meds:.acetaminophen, albuterol, nitroGLYCERIN, ondansetron (ZOFRAN) IV    Vitals:   06/06/16 2130 06/06/16 2215 06/07/16 0022 06/07/16 0602  BP: (!) 150/109 (!) 149/106 (!) 143/105 (!) 157/109  Pulse: 84  75 71  Resp: 17  17 18   Temp: 97.9 F (36.6 C)  98 F (36.7 C) 98.1 F (36.7 C)  TempSrc: Oral  Oral Oral  SpO2: 100%  96% 99%  Weight:    208 lb 6.4 oz (94.5 kg)  Height:        Intake/Output Summary (Last 24 hours) at 06/07/16 0717 Last data filed at 06/07/16 0603  Gross per 24 hour  Intake          1006.25 ml  Output              825 ml  Net           181.25 ml    LABS: Basic Metabolic Panel:  Recent Labs  06/06/16 0736 06/06/16 1157 06/07/16 0442  NA 142  --  142  K 3.9  --  3.9  CL 108  --  111  CO2 26  --  23  GLUCOSE 97  --  93  BUN 46*  --  38*  CREATININE 2.81* 2.70* 2.43*  CALCIUM 8.2*  --  8.2*   Liver Function Tests: No results for input(s): AST, ALT, ALKPHOS, BILITOT, PROT, ALBUMIN in the last 72 hours. No results for input(s): LIPASE, AMYLASE in the last 72 hours. CBC:  Recent Labs  06/06/16 1157 06/07/16 0442  WBC 6.6 5.1  HGB 10.3* 10.1*  HCT 32.8* 32.4*  MCV 78.3 78.3  PLT 265  260   Cardiac Enzymes: No results for input(s): CKTOTAL, CKMB, CKMBINDEX, TROPONINI in the last 72 hours. BNP: Invalid input(s): POCBNP D-Dimer: No results for input(s): DDIMER in the last 72 hours. Hemoglobin A1C: No results for input(s): HGBA1C in the last 72 hours. Fasting Lipid Panel: No results for input(s): CHOL, HDL, LDLCALC, TRIG, CHOLHDL, LDLDIRECT in the last 72 hours. Thyroid Function Tests: No results for input(s): TSH, T4TOTAL, T3FREE, THYROIDAB in the last 72 hours.  Invalid input(s): FREET3 Anemia Panel: No results for input(s): VITAMINB12, FOLATE, FERRITIN, TIBC, IRON, RETICCTPCT in the last 72 hours.  RADIOLOGY: No results found.  PHYSICAL EXAM General: NAD Neck: JVP 9-10 cm, no thyromegaly or thyroid nodule.  Lungs: Clear to auscultation bilaterally with normal respiratory effort. CV: Lateral PMI.  Heart regular S1/S2, no S3/S4, 3/6 HSM apex.  Trace ankle edema.  No carotid bruit.  Normal pedal pulses.  Abdomen: Soft, nontender, no hepatosplenomegaly, no distention.  Neurologic: Alert and oriented x 3.  Psych: Normal affect. Extremities: No clubbing or cyanosis.   TELEMETRY: Personally reviewed telemetry pt in NSR  ASSESSMENT AND PLAN: 51 yo with long-standing, poorly-controlled HTN, CKD stage 3-4, chronic systolic CHF and severe mitral regurgitation. He came yesterday for preop right/left heart cath but creatinine up a bit and got valsartan and Lasix the day prior.   1. CKD stage 4: Creatinine 2.8 yesterday, up from 2.5.  I admitted Mr Cavallero overnight with gentle hydration, NS @ 75 cc/hr starting prior to cath.  He is at high risk for AKI from contrast nephropathy and I would like to mitigate this risk as much as possible.  Creatinine today down to 2.4 which is likely the lowest we will see.  I will proceed with cath today, trying to minimize contrast.   2. HTN: Possible hypertensive cardiomyopathy and hypertensive nephropathy. Needs good BP control. -  Continue home Coreg, hydralazine, Imdur.  - Will restart valsartan post-cath (probably hold until Friday).  - Add amlodipine 5 mg daily.  3. Chronic systolic CHF: EF 22-02% with moderate LV dilation and diffuse LV hypokinesis on TEE in 5/18. Cause of cardiomyopathy may be long-standing, poorly controlled HTN. He has not had an ischemic workup. NYHA class II symptoms. Mild volume overload, improved overall.  - Holding Laisx with gentle hydration.  Difficult situation with CKD stage 4, need for cath, and tendency towards volume overload.  Will certainly need diuresis post-cath.  - Continue hydralazine 100 tid, Imdur 90 daily.   - Continue Coreg 25 mg bid.  - Hold valsartan today and tomorrow.   4. Mitral regurgitation: Severe. See TEE report above. I suspect there is a component of functional MR from dilated LV, but MV leaflets are thickened, and I am concerned for a component of primary MR as well. He has seen Dr. Roxy Manns, and given the suspected component of primary MR, MV repair has been recommended.  - He will need LHC/RHC prior to surgery. I discussed risks/benefits again with patient again today. He understands the risk to his kidneys from the contrast. Creatinine improved, will proceed today.  Loralie Champagne 06/07/2016 7:21 AM

## 2016-06-07 NOTE — Progress Notes (Addendum)
Site area: RFV Site Prior to Removal:  Level 0 Pressure Applied For:20 min Manual:   yes Patient Status During Pull:  stable Post Pull Site:  Level 0 Post Pull Instructions Given:  yes Post Pull Pulses Present: palpable Dressing Applied:  tegaderm Bedrest begins @ 1430 [id number] Comments:

## 2016-06-07 NOTE — Progress Notes (Signed)
Patient gone to cath lab, Call placed to CCMD to notify of telemetry monitoring temporarily d/c.

## 2016-06-08 ENCOUNTER — Encounter (HOSPITAL_COMMUNITY): Payer: Self-pay | Admitting: Cardiology

## 2016-06-08 DIAGNOSIS — I5043 Acute on chronic combined systolic (congestive) and diastolic (congestive) heart failure: Secondary | ICD-10-CM

## 2016-06-08 DIAGNOSIS — N179 Acute kidney failure, unspecified: Secondary | ICD-10-CM

## 2016-06-08 LAB — BASIC METABOLIC PANEL
Anion gap: 10 (ref 5–15)
BUN: 34 mg/dL — ABNORMAL HIGH (ref 6–20)
CO2: 27 mmol/L (ref 22–32)
Calcium: 8.3 mg/dL — ABNORMAL LOW (ref 8.9–10.3)
Chloride: 105 mmol/L (ref 101–111)
Creatinine, Ser: 2.42 mg/dL — ABNORMAL HIGH (ref 0.61–1.24)
GFR calc Af Amer: 34 mL/min — ABNORMAL LOW (ref >60)
GFR calc non Af Amer: 30 mL/min — ABNORMAL LOW (ref >60)
Glucose, Bld: 90 mg/dL (ref 65–99)
Potassium: 3.4 mmol/L — ABNORMAL LOW (ref 3.5–5.1)
Sodium: 142 mmol/L (ref 135–145)

## 2016-06-08 LAB — POCT I-STAT 3, VENOUS BLOOD GAS (G3P V)
Acid-base deficit: 1 mmol/L (ref 0.0–2.0)
Bicarbonate: 24.6 mmol/L (ref 20.0–28.0)
O2 Saturation: 68 %
TCO2: 26 mmol/L (ref 0–100)
pCO2, Ven: 41.9 mmHg — ABNORMAL LOW (ref 44.0–60.0)
pH, Ven: 7.376 (ref 7.250–7.430)
pO2, Ven: 36 mmHg (ref 32.0–45.0)

## 2016-06-08 LAB — POCT ACTIVATED CLOTTING TIME: Activated Clotting Time: 164 seconds

## 2016-06-08 MED ORDER — AMLODIPINE BESYLATE 5 MG PO TABS: 5.0000 mg | ORAL_TABLET | Freq: Every day | ORAL | 6 refills | Status: DC

## 2016-06-08 MED ORDER — FUROSEMIDE 40 MG PO TABS: 40.0000 mg | ORAL_TABLET | Freq: Every day | ORAL | Status: DC

## 2016-06-08 MED ORDER — FUROSEMIDE 80 MG PO TABS: 80.0000 mg | ORAL_TABLET | Freq: Every day | ORAL | Status: DC

## 2016-06-08 MED ORDER — ASPIRIN 81 MG PO TBEC
81.0000 mg | DELAYED_RELEASE_TABLET | Freq: Every day | ORAL | 6 refills | Status: DC
Start: 2016-06-08 — End: 2016-08-23

## 2016-06-08 MED ORDER — POTASSIUM CHLORIDE CRYS ER 20 MEQ PO TBCR: 40.0000 meq | EXTENDED_RELEASE_TABLET | Freq: Once | ORAL | Status: DC

## 2016-06-08 MED ORDER — VALSARTAN 160 MG PO TABS: 160.0000 mg | ORAL_TABLET | Freq: Every day | ORAL | 11 refills | Status: DC

## 2016-06-08 NOTE — Progress Notes (Signed)
Call placed to C[facility] to notify of telemetry monitoring d/c.   

## 2016-06-08 NOTE — Progress Notes (Signed)
Pt refuses wheelchair. Pt states he will take morning meds at home. Pt ambulated independently with family, who will drive him home.

## 2016-06-08 NOTE — Progress Notes (Addendum)
Patient ID: Vernon Blackburn, male   DOB: 1965-06-23, 51 y.o.   MRN: [id number]   SUBJECTIVE: Given markedly elevated PCWP on cath yesterday, Vernon Blackburn was kept overnight and diuresed.  Weight is down today and he feels good, no dyspnea.   Creatinine stable today at 2.4.  He got 35-40 cc contrast with cath yesterday.     RHC/LHC (06/07/16):  Dominance: Right  Left Main  No significant disease.  Left Anterior Descending  Mild luminal irregularities.  Left Circumflex  No significant disease.  Right Coronary Artery  No significant disease.  Right Heart   Right Heart Pressures RHC Procedural Findings: Hemodynamics (mmHg) RA mean 15 RV 73/18 PA 76/31, mean 48 PCWP mean 45 LV 133/29 AO 141/95  Oxygen saturations: PA 68% AO 100%  Cardiac Output (Fick) 6.44  Cardiac Index (Fick) 3.02  Cardiac Output (thermo) 5.63 Cardiac Index (thermo) 2.64       Scheduled Meds: . amLODipine  5 mg Oral Daily  . aspirin EC  81 mg Oral Daily  . carvedilol  25 mg Oral BID  . cholecalciferol  1,000 Units Oral Daily  . febuxostat  80 mg Oral Daily  . ferrous [id number] C-folic acid  1 capsule Oral Daily  . furosemide  80 mg Intravenous Q8H  . heparin  5,000 Units Subcutaneous Q8H  . hydrALAZINE  100 mg Oral TID  . isosorbide mononitrate  90 mg Oral Daily  . lisdexamfetamine  60 mg Oral Daily  . multivitamin with minerals  1 tablet Oral Daily  . potassium chloride  40 mEq Oral Once  . sodium chloride flush  3 mL Intravenous Q12H   Continuous Infusions: . sodium chloride 250 mL (06/07/16 1513)   PRN Meds:.sodium chloride, acetaminophen, albuterol, nitroGLYCERIN, ondansetron (ZOFRAN) IV, sodium chloride flush    Vitals:   06/07/16 1555 06/07/16 1625 06/07/16 1700 06/08/16 0511  BP: (!) 152/99 (!) 161/113 (!) 155/99 124/82  Pulse: 72 77 78 72  Resp: 18 18 18 18   Temp:    98.2 F (36.8 C)  TempSrc:    Oral  SpO2: 99% 100% 100% 100%  Weight:    196 lb 3.2 oz (89 kg)   Height:        Intake/Output Summary (Last 24 hours) at 06/08/16 0724 Last data filed at 06/08/16 0512  Gross per 24 hour  Intake          1487.83 ml  Output             2600 ml  Net         -1112.17 ml    LABS: Basic Metabolic Panel:  Recent Labs  06/07/16 0442 06/08/16 0342  NA 142 142  K 3.9 3.4*  CL 111 105  CO2 23 27  GLUCOSE 93 90  BUN 38* 34*  CREATININE 2.43* 2.42*  CALCIUM 8.2* 8.3*   Liver Function Tests: No results for input(s): AST, ALT, ALKPHOS, BILITOT, PROT, ALBUMIN in the last 72 hours. No results for input(s): LIPASE, AMYLASE in the last 72 hours. CBC:  Recent Labs  06/06/16 1157 06/07/16 0442  WBC 6.6 5.1  HGB 10.3* 10.1*  HCT 32.8* 32.4*  MCV 78.3 78.3  PLT 265 260   Cardiac Enzymes: No results for input(s): CKTOTAL, CKMB, CKMBINDEX, TROPONINI in the last 72 hours. BNP: Invalid input(s): POCBNP D-Dimer: No results for input(s): DDIMER in the last 72 hours. Hemoglobin A1C: No results for input(s): HGBA1C in the last 72 hours. Fasting Lipid Panel: No  results for input(s): CHOL, HDL, LDLCALC, TRIG, CHOLHDL, LDLDIRECT in the last 72 hours. Thyroid Function Tests: No results for input(s): TSH, T4TOTAL, T3FREE, THYROIDAB in the last 72 hours.  Invalid input(s): FREET3 Anemia Panel: No results for input(s): VITAMINB12, FOLATE, FERRITIN, TIBC, IRON, RETICCTPCT in the last 72 hours.  RADIOLOGY: No results found.  PHYSICAL EXAM General: NAD Neck: JVP 8-9 cm, no thyromegaly or thyroid nodule.  Lungs: Clear to auscultation bilaterally with normal respiratory effort. CV: Lateral PMI.  Heart regular S1/S2, no S3/S4, 3/6 HSM apex.  1+ ankle edema.  No carotid bruit.  Normal pedal pulses.  Abdomen: Soft, nontender, no hepatosplenomegaly, no distention.  Neurologic: Alert and oriented x 3.  Psych: Normal affect. Extremities: No clubbing or cyanosis.   TELEMETRY: Personally reviewed telemetry pt in NSR  ASSESSMENT AND PLAN: 51 yo with  long-standing, poorly-controlled HTN, CKD stage 3-4, chronic systolic CHF and severe mitral regurgitation. He came yesterday for preop right/left heart cath but creatinine up a bit and got valsartan and Lasix the day prior.   1. CKD stage 4: Creatinine 2.4 today, stable so far post-cath.  He got 35-40 cc contrast.  2. HTN: Possible hypertensive cardiomyopathy and hypertensive nephropathy. Needs good BP control. - Continue home Coreg, hydralazine, Imdur.  - Will restart valsartan 160 mg daily on Friday.  - Continue amlodipine 5 mg daily.   3. Acute on chronic systolic CHF: EF 16-01% with moderate LV dilation and diffuse LV hypokinesis on TEE in 5/18. Cause of cardiomyopathy may be long-standing, poorly controlled HTN. No significant CAD on coronary angiography this admission. NYHA class II symptoms.  I kept him overnight after cath to diurese due to markedly high PCWP.  He has had 3 doses of IV Lasix now and weight is down.  He feels good.  - Restart Lasix 80 qam/40 qpm this evening (he had IV Lasix this morning).  - Continue hydralazine 100 tid, Imdur 90 daily.   - Continue Coreg 25 mg bid.  - Restart valsartan 160 mg daily tomorrow.   4. Mitral regurgitation: Severe. See TEE report above. I suspect there is a component of functional Vernon from dilated LV, but MV leaflets are thickened, and I am concerned for a component of primary Vernon as well. He has seen Dr. Roxy Manns, and given the suspected component of primary Vernon, MV repair has been recommended.  - LHC/RHC pre-op done yesterday, will not need CABG. 5. Disposition: Home today.  Has followup with Dr. Roxy Manns, will need to see me as well.  Meds for home: valsartan 160 mg daily (start on Friday), Lasix 80 qam/40 qpm (start with pm dose tonight), amlodipine 5 mg daily, Coreg 25 mg bid, hydralazine 100 mg tid, Imdur 90 mg daily, KCl 10 daily, noncardiac meds as prior to admission.  Loralie Champagne 06/08/2016 7:24 AM

## 2016-06-08 NOTE — Discharge Summary (Signed)
Advanced Heart Failure Discharge Note  Discharge Summary   Patient ID: Vernon Blackburn MRN: [id number], DOB/AGE: 50-Aug-1967 50 y.o. Admit date: 06/06/2016 D/C date:     06/08/2016   Primary Discharge Diagnoses:  1. AKI on CKD stage 4 2. HTN 3. Acute on Chronic systolic CHF  4. Severe Mitral Regurgitation  Hospital Course:   Vernon Blackburn is a 51 y.o. male with PMH of HTN, CKD stage III-IV, severe MR, and Systolic CHF EF 13-08% due to NICM.   Pt presented for [facility] 06/06/16 prior to planned MV repair.  Pt had not held lasix or valsartan as instructed, and creatinine noted to rise from 2.5 to 2.8. Admitted for IV hydration and cath deferred.  R/LHC as below with no significant CAD and minimal contrast used, though severely elevated R/L filling pressures noted. PCWP elevated more than LVEDP + pulmonary venous hypertension. CO preserved.  Kept an additional day for diuresis with volume overload. Weight down 6 lbs from admission.   Pt examined am of 06/08/16 and determined to be stable for discharge to home. Will follow up with Dr. Roxy Manns as outpatient for further consideration for MVR. Follow up as below.   Discharge Weight Range: 196 lbs Discharge Vitals: Blood pressure 124/82, pulse 72, temperature 98.2 F (36.8 C), temperature source Oral, resp. rate 18, height 5\' 10"  (1.778 m), weight 196 lb 3.2 oz (89 kg), SpO2 100 %.  Labs: Lab Results  Component Value Date   WBC 5.1 06/07/2016   HGB 10.1 (L) 06/07/2016   HCT 32.4 (L) 06/07/2016   MCV 78.3 06/07/2016   PLT 260 06/07/2016     Recent Labs Lab 06/08/16 0342  NA 142  K 3.4*  CL 105  CO2 27  BUN 34*  CREATININE 2.42*  CALCIUM 8.3*  GLUCOSE 90   No results found for: CHOL, HDL, LDLCALC, TRIG BNP (last 3 results)  Recent Labs  01/02/16 1240 05/22/16 1617 05/30/16 1615  BNP 1,225.2* 789.2* 783.7*    ProBNP (last 3 results) No results for input(s): PROBNP in the last 8760 hours.   Diagnostic Studies/Procedures    RHC/LHC (06/07/16):  Dominance: Right  Left Main  No significant disease.  Left Anterior Descending  Mild luminal irregularities.  Left Circumflex  No significant disease.  Right Coronary Artery  No significant disease.  Right Heart   Right Heart Pressures RHC Procedural Findings: Hemodynamics (mmHg) RA mean 15 RV 73/18 PA 76/31, mean 48 PCWP mean 45 LV 133/29 AO 141/95  Oxygen saturations: PA 68% AO 100%  Cardiac Output (Fick) 6.44  Cardiac Index (Fick) 3.02  Cardiac Output (thermo) 5.63 Cardiac Index (thermo) 2.64       Discharge Medications   Allergies as of 06/08/2016   No Known Allergies     Medication List    TAKE these medications   albuterol 108 (90 Base) MCG/ACT inhaler Commonly known as:  PROVENTIL HFA;VENTOLIN HFA Inhale 2 puffs into the lungs every 6 (six) hours as needed for wheezing or shortness of breath.   albuterol (5 MG/ML) 0.5% nebulizer solution Commonly known as:  PROVENTIL Take 2.5 mg by nebulization every 6 (six) hours as needed for wheezing or shortness of breath.   amLODipine 5 MG tablet Commonly known as:  NORVASC Take 1 tablet (5 mg total) by mouth daily.   aspirin 81 MG EC tablet Take 1 tablet (81 mg total) by mouth daily.   carvedilol 25 MG tablet Commonly known as:  COREG Take 1 tablet (25 mg  total) by mouth 2 (two) times daily.   cholecalciferol 1000 units tablet Commonly known as:  VITAMIN D Take 1,000 Units by mouth daily.   FERREX 28 PO Take 1 tablet by mouth daily.   furosemide 40 MG tablet Commonly known as:  LASIX Take 2 tablets in the AM and 1 tablet in the PM   hydrALAZINE 100 MG tablet Commonly known as:  APRESOLINE Take 1 tablet (100 mg total) by mouth 3 (three) times daily.   isosorbide mononitrate 60 MG 24 hr tablet Commonly known as:  IMDUR Take 1 tablet (60 mg total) by mouth daily.   lisdexamfetamine 60 MG capsule Commonly known as:  VYVANSE Take 60 mg by mouth every morning.    multivitamin with minerals Tabs tablet Take 1 tablet by mouth daily.   potassium chloride 10 MEQ tablet Commonly known as:  K-DUR Take 1 tablet (10 mEq total) by mouth daily.   ULORIC 80 MG Tabs Generic drug:  Febuxostat Take 80 mg by mouth daily.   valsartan 160 MG tablet Commonly known as:  DIOVAN Take 1 tablet (160 mg total) by mouth daily. Start taking on:  06/09/2016       Disposition   The patient will be discharged in stable condition to home. Discharge Instructions    (HEART FAILURE PATIENTS) Call MD:  Anytime you have any of the following symptoms: 1) 3 pound weight gain in 24 hours or 5 pounds in 1 week 2) shortness of breath, with or without a dry hacking cough 3) swelling in the hands, feet or stomach 4) if you have to sleep on extra pillows at night in order to breathe.    Complete by:  As directed    Diet - low sodium heart healthy    Complete by:  As directed    Heart Failure patients record your daily weight using the same scale at the same time of day    Complete by:  As directed    Increase activity slowly    Complete by:  As directed      Follow-up Information    Larey Dresser, MD Follow up on 06/26/2016.   Specialty:  Cardiology Why:  at 340 pm for post cath follow up. Code for parking is 6002. Contact information: [place] [address] [phone]             Duration of Discharge Encounter: Greater than 35 minutes   Signed, Annamaria Helling 06/08/2016, 1:54 PM

## 2016-06-20 ENCOUNTER — Ambulatory Visit (INDEPENDENT_AMBULATORY_CARE_PROVIDER_SITE_OTHER): Payer: Self-pay | Admitting: Thoracic Surgery (Cardiothoracic Vascular Surgery)

## 2016-06-20 ENCOUNTER — Encounter: Payer: Self-pay | Admitting: Thoracic Surgery (Cardiothoracic Vascular Surgery)

## 2016-06-20 ENCOUNTER — Other Ambulatory Visit: Payer: Self-pay | Admitting: *Deleted

## 2016-06-20 VITALS — BP 147/95 | HR 79 | Resp 20 | Ht 70.0 in | Wt 196.0 lb

## 2016-06-20 DIAGNOSIS — I340 Nonrheumatic mitral (valve) insufficiency: Secondary | ICD-10-CM

## 2016-06-20 DIAGNOSIS — I5022 Chronic systolic (congestive) heart failure: Secondary | ICD-10-CM

## 2016-06-20 DIAGNOSIS — I341 Nonrheumatic mitral (valve) prolapse: Secondary | ICD-10-CM

## 2016-06-20 NOTE — Patient Instructions (Signed)
  Continue taking all current medications without change through the day before surgery.  Have nothing to eat or drink after midnight the night before surgery.  On the morning of surgery take only carvedilol, amlodipine, and hydralazine with a sip of water.

## 2016-06-20 NOTE — Progress Notes (Signed)
[address]       [place] [address]             [phone]     CARDIOTHORACIC SURGERY OFFICE NOTE  Referring Provider is Larey Dresser, MD PCP is [name], MD   HPI:  Patient returns to the office today for follow-up of stage D severe symptomatic mitral regurgitation. He was originally seen in consultation on 05/25/2016. Since then he underwent left and right heart catheterization by [name]. He returns to the office today to review the results of his catheterization and discuss treatment options further. He states that he continues to improve with medical therapy. He has lost several more pounds in weight with diuretic therapy and further adjustment of his antihypertensive medications by [name]. He still gets short of breath with activity but he is feeling better and his renal function has remained stable.   Current Outpatient Prescriptions  Medication Sig Dispense Refill  . albuterol (PROVENTIL HFA;VENTOLIN HFA) 108 (90 Base) MCG/ACT inhaler Inhale 2 puffs into the lungs every 6 (six) hours as needed for wheezing or shortness of breath.    Marland Kitchen albuterol (PROVENTIL) (5 MG/ML) 0.5% nebulizer solution Take 2.5 mg by nebulization every 6 (six) hours as needed for wheezing or shortness of breath.    Marland Kitchen amLODipine (NORVASC) 5 MG tablet Take 1 tablet (5 mg total) by mouth daily. 30 tablet 6  . aspirin EC 81 MG EC tablet Take 1 tablet (81 mg total) by mouth daily. 30 tablet 6  . carvedilol (COREG) 25 MG tablet Take 1 tablet (25 mg total) by mouth 2 (two) times daily. 180 tablet 3  . cholecalciferol (VITAMIN D) 1000 units tablet Take 1,000 Units by mouth daily.    . FeAspGl-FeFum-B12-FA-C-Succ Ac (FERREX 28 PO) Take 1 tablet by mouth daily.     . Febuxostat (ULORIC) 80 MG TABS Take 80 mg by mouth daily.     . furosemide (LASIX) 40 MG tablet Take 2 tablets in the AM and 1 tablet in the PM 90 tablet 3  . hydrALAZINE (APRESOLINE) 100 MG tablet Take 1  tablet (100 mg total) by mouth 3 (three) times daily. 90 tablet 3  . isosorbide mononitrate (IMDUR) 60 MG 24 hr tablet Take 1 tablet (60 mg total) by mouth daily. 30 tablet 3  . lisdexamfetamine (VYVANSE) 60 MG capsule Take 60 mg by mouth every morning.    . Multiple Vitamin (MULTIVITAMIN WITH MINERALS) TABS tablet Take 1 tablet by mouth daily.    . potassium chloride (K-DUR) 10 MEQ tablet Take 1 tablet (10 mEq total) by mouth daily. 30 tablet 3  . valsartan (DIOVAN) 160 MG tablet Take 1 tablet (160 mg total) by mouth daily. 30 tablet 11   No current facility-administered medications for this visit.       Physical Exam:   BP (!) 147/95   Pulse 79   Resp 20   Ht 5\' 10"  (1.778 m)   Wt 196 lb (88.9 kg)   SpO2 98% Comment: RA  BMI 28.12 kg/m   General:  Well-appearing  Chest:   Clear to auscultation  CV:   Regular rate and rhythm with prominent systolic murmur heard best at the apex  Incisions:  n/a  Abdomen:  Soft and nontender  Extremities:  Warm and well perfused, palpable pulses in both the left and right groin and lower leg  Diagnostic Tests:  Right/Left Heart Cath and Coronary Angiography  Conclusion   1. No significant coronary disease.  Minimal contrast used with angiography.  2. Severely elevated right and left heart filling pressures.  PCWP elevated more than LVEDP.  3. Preserved cardiac output.  4. Pulmonary venous hypertension.   I am going to give him 80 mg IV Lasix now.  I recommended that he stay in the hospital overnight for diuresis as we gave him a fair amount of fluid pre-cath with renal dysfunction.  He is very reluctant to stay.  Will reassess after he gets IV Lasix.   If he goes home today, he will resume his prior home meds with addition of amlodipine 5 mg daily.  He will hold valsartan until Saturday (may then restart).  He will restart his po Lasix regimen tomorrow (the same regimen we had him on before he starting holding meds for cath).   He has  followup arranged.   Procedural Details/Technique   Technical Details Procedure: Right Heart Cath, Left Heart Cath, Selective Coronary Angiography  Indication: Pre-mitral valve repair   Procedural Details: The right groin was prepped, draped, and anesthetized with 1% lidocaine. Using the modified Seldinger technique a 7 French sheath was placed in the right femoral vein. A Swan-Ganz catheter was used for the right heart catheterization. Standard protocol was followed for recording of right heart pressures and sampling of oxygen saturations. Fick and thermodilution cardiac output was calculated. The right radial artery was entered using modified Seldinger technique and a 47F arterial sheath was placed. The patient received 3 mg IA verapamil and weight-based IV heparin. Standard Judkins catheters were used for selective coronary angiography. There were no immediate procedural complications. The patient was transferred to the post catheterization recovery area for further monitoring.\  35-40 cc contrast used.   Estimated blood loss <50 mL.  During this procedure the patient was administered the following to achieve and maintain moderate conscious sedation: Versed 1 mg, Fentanyl 25 mcg, while the patient's heart rate, blood pressure, and oxygen saturation were continuously monitored. The period of conscious sedation was 51 minutes, of which I was present face-to-face 100% of this time.    Coronary Findings   Dominance: Right  Left Main  No significant disease.  Left Anterior Descending  Mild luminal irregularities.  Left Circumflex  No significant disease.  Right Coronary Artery  No significant disease.  Right Heart   Right Heart Pressures RHC Procedural Findings: Hemodynamics (mmHg) RA mean 15 RV 73/18 PA 76/31, mean 48 PCWP mean 45 LV 133/29 AO 141/95  Oxygen saturations: PA 68% AO 100%  Cardiac Output (Fick) 6.44  Cardiac Index (Fick) 3.02  Cardiac Output (thermo)  5.63 Cardiac Index (thermo) 2.64    Wall Motion   Not done, CKD        Implants     No implant documentation for this case.  PACS Images   Show images for Cardiac catheterization   Link to Procedure Log   Procedure Log    Hemo Data    Most Recent Value  Fick Cardiac Output 6.44 L/min  Fick Cardiac Output Index 3.02 (L/min)/BSA  Thermal Cardiac Output 5.63 L/min  Thermal Cardiac Output Index 2.64 (L/min)/BSA  RA A Wave 19 mmHg  RA V Wave 17 mmHg  RA Mean 15 mmHg  RV Systolic Pressure 73 mmHg  RV Diastolic Pressure 7 mmHg  RV EDP 18 mmHg  PA Systolic Pressure 76 mmHg  PA Diastolic Pressure 31 mmHg  PA Mean 48 mmHg  PW A Wave 47 mmHg  PW V Wave 48 mmHg  PW Mean 45 mmHg  AO Systolic Pressure 102 mmHg  AO Diastolic Pressure 725 mmHg  AO Mean 366 mmHg  LV Systolic Pressure 440 mmHg  LV Diastolic Pressure 10 mmHg  LV EDP 29 mmHg  Arterial Occlusion Pressure Extended Systolic Pressure 347 mmHg  Arterial Occlusion Pressure Extended Diastolic Pressure 95 mmHg  Arterial Occlusion Pressure Extended Mean Pressure 116 mmHg  Left Ventricular Apex Extended Systolic Pressure 425 mmHg  Left Ventricular Apex Extended Diastolic Pressure 12 mmHg  Left Ventricular Apex Extended EDP Pressure 32 mmHg  TPVR Index 18.16 HRUI  TSVR Index 46.16 HRUI  PVR SVR Ratio 0.03  TPVR/TSVR Ratio 0.39      Impression:  Patient has stage D severe symptomatic mitral regurgitation. He originally presented with acute combined systolic and diastolic congestive heart failure with class IV symptoms several months ago in the setting of poorly controlled hypertension and chronic kidney disease. He has done reasonably well on medical therapy and currently remains quite stable with class II symptoms of exertional shortness of breath and mild lower extremity edema. However, despite optimal medical therapy recent transesophageal echocardiogram reveals persistent severe mitral regurgitation in the setting of  severe left ventricular systolic dysfunction. I personally reviewed the patient's transthoracic and transesophageal echocardiograms. There is severe global left ventricular systolic dysfunction.  Left and right heart catheterization is notable for the presence of widely patent coronary arteries with no significant coronary artery disease. The patient had severe pulmonary hypertension with preserved cardiac output. Pulmonary capillary wedge pressure was elevated greater than left ventricular end-diastolic pressure.  On careful review of the patient's recent transesophageal echocardiogram, functional anatomy of the mitral valve appears potentially mixed. For the most part the leaflets appear reasonably thin and mobile. There is no prolapse. The mitral annulus is dilated. This could be primarily a combination of type I and/or type IIIB dysfunction related to the patient's underlying ventricular disease. However, restriction of the posterior leaflet seems overly pronounced despite reasonable posterior lateral wall contractility, so the possibility of intrinsic valvular pathology seems reasonable, particularly given the fact the patient states that he was told he had a heart murmur many years ago.  Overall I agree the patient may have a combination of both primary and secondary mitral regurgitation.  Even if his mitral regurgitation is purely secondary, he still has symptoms of exertional shortness of breath and severe mitral regurgitation despite optimal medical therapy. I agree he would likely benefit from mitral valve repair.     Plan:  The patient was again counseled at length regarding the indications, risks and potential benefits of mitral valve repair.  The rationale for elective surgery has been explained, including a comparison between surgery and continued medical therapy with close follow-up.  The likelihood of successful and durable valve repair has been discussed with particular reference to the  findings of their recent echocardiogram.  Based upon these findings and previous experience, I have quoted them a greater than 50 percent likelihood of successful valve repair.  In the unlikely event that their valve cannot be successfully repaired, we discussed the possibility of replacing the mitral valve using a mechanical prosthesis with the attendant need for long-term anticoagulation versus the alternative of replacing it using a bioprosthetic tissue valve with its potential for late structural valve deterioration and failure, depending upon the patient's longevity.  The patient specifically requests that if the mitral valve must be replaced that it be done using a mechanical valve.   The  patient understands and accepts all potential risks of surgery including but not limited to risk of death, stroke or other neurologic complication, myocardial infarction, congestive heart failure, respiratory failure, renal failure, bleeding requiring transfusion and/or reexploration, arrhythmia, infection or other wound complications, pneumonia, pleural and/or pericardial effusion, pulmonary embolus, aortic dissection or other major vascular complication, or delayed complications related to valve repair or replacement including but not limited to structural valve deterioration and failure, thrombosis, embolization, endocarditis, or paravalvular leak.  Issues related to the patient's underlying severe global left ventricular systolic dysfunction and chronic kidney disease were discussed including the small but significant possibility that he might need at least short-term mechanical circulatory support and/or dialysis therapy. The possibility that he may need prolonged inotropic support have been discussed.  Alternative surgical approaches have been discussed including a comparison between conventional sternotomy and minimally-invasive techniques.  The relative risks and benefits of each have been reviewed as they pertain to  the patient's specific circumstances, and all of their questions have been addressed.  Specific risks potentially related to the minimally-invasive approach were discussed at length, including but not limited to risk of conversion to full or partial sternotomy, aortic dissection or other major vascular complication, unilateral acute lung injury or pulmonary edema, phrenic nerve dysfunction or paralysis, rib fracture, chronic pain, lung hernia, or lymphocele.   All of his questions have been answered.  We plan to proceed with surgery on Thursday, 06/29/2016.    [name], MD 06/20/2016 4:57 PM

## 2016-06-21 ENCOUNTER — Other Ambulatory Visit: Payer: Self-pay | Admitting: *Deleted

## 2016-06-26 ENCOUNTER — Ambulatory Visit (HOSPITAL_COMMUNITY)
Admission: RE | Admit: 2016-06-26 | Discharge: 2016-06-26 | Disposition: A | Discharge status: Home / Self Care | Payer: Medicaid Other | Source: Ambulatory Visit | Attending: Cardiology | Admitting: Cardiology

## 2016-06-26 ENCOUNTER — Encounter (HOSPITAL_COMMUNITY): Payer: Self-pay

## 2016-06-26 VITALS — BP 142/92 | HR 76 | Wt 197.4 lb

## 2016-06-26 DIAGNOSIS — I340 Nonrheumatic mitral (valve) insufficiency: Secondary | ICD-10-CM

## 2016-06-26 DIAGNOSIS — I5022 Chronic systolic (congestive) heart failure: Secondary | ICD-10-CM

## 2016-06-26 DIAGNOSIS — I130 Hypertensive heart and chronic kidney disease with heart failure and stage 1 through stage 4 chronic kidney disease, or unspecified chronic kidney disease: Secondary | ICD-10-CM | POA: Insufficient documentation

## 2016-06-26 DIAGNOSIS — M109 Gout, unspecified: Secondary | ICD-10-CM | POA: Insufficient documentation

## 2016-06-26 DIAGNOSIS — N184 Chronic kidney disease, stage 4 (severe): Secondary | ICD-10-CM | POA: Diagnosis not present

## 2016-06-26 DIAGNOSIS — Z79899 Other long term (current) drug therapy: Secondary | ICD-10-CM | POA: Insufficient documentation

## 2016-06-26 DIAGNOSIS — Z7982 Long term (current) use of aspirin: Secondary | ICD-10-CM | POA: Insufficient documentation

## 2016-06-26 LAB — BASIC METABOLIC PANEL
Anion gap: 8 (ref 5–15)
BUN: 28 mg/dL — ABNORMAL HIGH (ref 6–20)
CO2: 28 mmol/L (ref 22–32)
Calcium: 8.6 mg/dL — ABNORMAL LOW (ref 8.9–10.3)
Chloride: 106 mmol/L (ref 101–111)
Creatinine, Ser: 2.53 mg/dL — ABNORMAL HIGH (ref 0.61–1.24)
GFR calc Af Amer: 33 mL/min — ABNORMAL LOW (ref >60)
GFR calc non Af Amer: 28 mL/min — ABNORMAL LOW (ref >60)
Glucose, Bld: 116 mg/dL — ABNORMAL HIGH (ref 65–99)
Potassium: 3.5 mmol/L (ref 3.5–5.1)
Sodium: 142 mmol/L (ref 135–145)

## 2016-06-26 MED ORDER — AMLODIPINE BESYLATE 10 MG PO TABS: 10.0000 mg | ORAL_TABLET | Freq: Every day | ORAL | 3 refills | Status: DC

## 2016-06-26 NOTE — Patient Instructions (Signed)
INCREASE Amlodipine to 10 mg (1 Tablet) Once Daily  Labs today (will call for abnormal results, otherwise no news is good news)  Follow up 6 weeks after surgery.

## 2016-06-27 ENCOUNTER — Encounter (HOSPITAL_COMMUNITY): Payer: Self-pay

## 2016-06-27 ENCOUNTER — Ambulatory Visit (HOSPITAL_COMMUNITY)
Admission: RE | Admit: 2016-06-27 | Discharge: 2016-06-27 | Disposition: A | Discharge status: Home / Self Care | Payer: Medicaid Other | Source: Ambulatory Visit | Attending: Thoracic Surgery (Cardiothoracic Vascular Surgery) | Admitting: Thoracic Surgery (Cardiothoracic Vascular Surgery)

## 2016-06-27 ENCOUNTER — Ambulatory Visit (HOSPITAL_BASED_OUTPATIENT_CLINIC_OR_DEPARTMENT_OTHER)
Admission: RE | Admit: 2016-06-27 | Discharge: 2016-06-27 | Disposition: A | Discharge status: Home / Self Care | Payer: Medicaid Other | Source: Ambulatory Visit | Attending: Thoracic Surgery (Cardiothoracic Vascular Surgery) | Admitting: Thoracic Surgery (Cardiothoracic Vascular Surgery)

## 2016-06-27 ENCOUNTER — Encounter (HOSPITAL_COMMUNITY)
Admission: RE | Admit: 2016-06-27 | Discharge: 2016-06-27 | Disposition: A | Discharge status: Home / Self Care | Payer: Medicaid Other | Source: Ambulatory Visit | Attending: Thoracic Surgery (Cardiothoracic Vascular Surgery) | Admitting: Thoracic Surgery (Cardiothoracic Vascular Surgery)

## 2016-06-27 DIAGNOSIS — I130 Hypertensive heart and chronic kidney disease with heart failure and stage 1 through stage 4 chronic kidney disease, or unspecified chronic kidney disease: Secondary | ICD-10-CM | POA: Diagnosis not present

## 2016-06-27 DIAGNOSIS — N184 Chronic kidney disease, stage 4 (severe): Secondary | ICD-10-CM | POA: Diagnosis not present

## 2016-06-27 DIAGNOSIS — I340 Nonrheumatic mitral (valve) insufficiency: Secondary | ICD-10-CM

## 2016-06-27 DIAGNOSIS — I5022 Chronic systolic (congestive) heart failure: Secondary | ICD-10-CM | POA: Diagnosis not present

## 2016-06-27 HISTORY — DX: Cardiac murmur, unspecified: R01.1

## 2016-06-27 HISTORY — DX: Pneumonia, unspecified organism: J18.9

## 2016-06-27 HISTORY — DX: Sleep apnea, unspecified: G47.30

## 2016-06-27 HISTORY — DX: Personal history of urinary calculi: Z87.442

## 2016-06-27 LAB — CBC
HCT: 33.3 % — ABNORMAL LOW (ref 39.0–52.0)
Hemoglobin: 10.3 g/dL — ABNORMAL LOW (ref 13.0–17.0)
MCH: 25.1 pg — ABNORMAL LOW (ref 26.0–34.0)
MCHC: 30.9 g/dL (ref 30.0–36.0)
MCV: 81 fL (ref 78.0–100.0)
Platelets: 216 10*3/uL (ref 150–400)
RBC: 4.11 MIL/uL — ABNORMAL LOW (ref 4.22–5.81)
RDW: 19.2 % — ABNORMAL HIGH (ref 11.5–15.5)
WBC: 5.2 10*3/uL (ref 4.0–10.5)

## 2016-06-27 LAB — COMPREHENSIVE METABOLIC PANEL
ALT: 20 U/L (ref 17–63)
AST: 20 U/L (ref 15–41)
Albumin: 3.5 g/dL (ref 3.5–5.0)
Alkaline Phosphatase: 52 U/L (ref 38–126)
Anion gap: 7 (ref 5–15)
BUN: 32 mg/dL — ABNORMAL HIGH (ref 6–20)
CO2: 29 mmol/L (ref 22–32)
Calcium: 8.6 mg/dL — ABNORMAL LOW (ref 8.9–10.3)
Chloride: 105 mmol/L (ref 101–111)
Creatinine, Ser: 2.24 mg/dL — ABNORMAL HIGH (ref 0.61–1.24)
GFR calc Af Amer: 38 mL/min — ABNORMAL LOW (ref >60)
GFR calc non Af Amer: 33 mL/min — ABNORMAL LOW (ref >60)
Glucose, Bld: 95 mg/dL (ref 65–99)
Potassium: 3.5 mmol/L (ref 3.5–5.1)
Sodium: 141 mmol/L (ref 135–145)
Total Bilirubin: 0.7 mg/dL (ref 0.3–1.2)
Total Protein: 6.1 g/dL — ABNORMAL LOW (ref 6.5–8.1)

## 2016-06-27 LAB — PULMONARY FUNCTION TEST
DL/VA % pred: 78 %
DL/VA: 3.64 ml/min/mmHg/L
DLCO unc % pred: 57 %
DLCO unc: 18.6 ml/min/mmHg
FEF 25-75 Post: 2.15 L/sec
FEF 25-75 Pre: 1.53 L/sec
FEF2575-%Change-Post: 40 %
FEF2575-%Pred-Post: 64 %
FEF2575-%Pred-Pre: 45 %
FEV1-%Change-Post: 9 %
FEV1-%Pred-Post: 77 %
FEV1-%Pred-Pre: 70 %
FEV1-Post: 2.59 L
FEV1-Pre: 2.36 L
FEV1FVC-%Change-Post: 3 %
FEV1FVC-%Pred-Pre: 84 %
FEV6-%Change-Post: 7 %
FEV6-%Pred-Post: 90 %
FEV6-%Pred-Pre: 84 %
FEV6-Post: 3.69 L
FEV6-Pre: 3.44 L
FEV6FVC-%Change-Post: 0 %
FEV6FVC-%Pred-Post: 102 %
FEV6FVC-%Pred-Pre: 101 %
FVC-%Change-Post: 6 %
FVC-%Pred-Post: 88 %
FVC-%Pred-Pre: 82 %
FVC-Post: 3.71 L
FVC-Pre: 3.48 L
Post FEV1/FVC ratio: 70 %
Post FEV6/FVC ratio: 100 %
Pre FEV1/FVC ratio: 68 %
Pre FEV6/FVC Ratio: 99 %
RV % pred: 176 %
RV: 3.61 L
TLC % pred: 99 %
TLC: 6.94 L

## 2016-06-27 LAB — VAS US DOPPLER PRE CABG
LEFT ECA DIAS: -7 cm/s
LEFT VERTEBRAL DIAS: -27 cm/s
Left CCA dist dias: -17 cm/s
Left CCA dist sys: -44 cm/s
Left CCA prox dias: -19 cm/s
Left CCA prox sys: -55 cm/s
Left ICA dist dias: -39 cm/s
Left ICA dist sys: -73 cm/s
Left ICA prox dias: -26 cm/s
Left ICA prox sys: -44 cm/s
RIGHT ECA DIAS: -16 cm/s
RIGHT VERTEBRAL DIAS: 16 cm/s
Right CCA prox dias: 20 cm/s
Right CCA prox sys: 55 cm/s
Right cca dist sys: -61 cm/s

## 2016-06-27 LAB — URINALYSIS, ROUTINE W REFLEX MICROSCOPIC
Bilirubin Urine: NEGATIVE
Glucose, UA: NEGATIVE mg/dL
Hgb urine dipstick: NEGATIVE
Ketones, ur: NEGATIVE mg/dL
Leukocytes, UA: NEGATIVE
Nitrite: NEGATIVE
Protein, ur: NEGATIVE mg/dL
Specific Gravity, Urine: 1.008 (ref 1.005–1.030)
pH: 5 (ref 5.0–8.0)

## 2016-06-27 LAB — APTT: aPTT: 37 seconds — ABNORMAL HIGH (ref 24–36)

## 2016-06-27 LAB — BLOOD GAS, ARTERIAL
Acid-Base Excess: 4.5 mmol/L — ABNORMAL HIGH (ref 0.0–2.0)
Bicarbonate: 28.2 mmol/L — ABNORMAL HIGH (ref 20.0–28.0)
Drawn by: 421801
FIO2: 21
O2 Saturation: 97.6 %
Patient temperature: 98.6
pCO2 arterial: 39.8 mmHg (ref 32.0–48.0)
pH, Arterial: 7.465 — ABNORMAL HIGH (ref 7.350–7.450)
pO2, Arterial: 121 mmHg — ABNORMAL HIGH (ref 83.0–108.0)

## 2016-06-27 LAB — ABO/RH: ABO/RH(D): O POS

## 2016-06-27 LAB — PROTIME-INR
INR: 1.03
Prothrombin Time: 13.5 seconds (ref 11.4–15.2)

## 2016-06-27 LAB — SURGICAL PCR SCREEN
MRSA, PCR: NEGATIVE
Staphylococcus aureus: NEGATIVE

## 2016-06-27 MED ORDER — ALBUTEROL SULFATE (2.5 MG/3ML) 0.083% IN NEBU
2.5000 mg | INHALATION_SOLUTION | Freq: Once | RESPIRATORY_TRACT | Status: AC
  Administered 2016-06-27: 2.5 mg via RESPIRATORY_TRACT

## 2016-06-27 NOTE — Progress Notes (Signed)
Pre-op Cardiac Surgery  Carotid Findings:  No evidence of a significant stenosis noted in bilateral carotid arteries. Vertebral demonstrated antegrade flow.   Upper Extremity Right Left  Brachial Pressures 147/ triphasic 137/triphasic  Radial Waveforms triphasic triphasic  Ulnar Waveforms triphasic triphasic  Palmar Arch (Allen's Test) WNL WNL   Findings:  Doppler waveforms remain within normal limit with compression bilaterally.

## 2016-06-27 NOTE — Progress Notes (Signed)
PCP: Dr. Delena Bali Cardiology: Dr. Radford Pax HF Cardiology: [name]  51 yo with history of long-standing, poorly-controlled HTN, CKD stage 3-4, chronic systolic CHF and severe mitral regurgitation.  He has had hypertension for years.  He has had known CKD, followed by Dr. [name].  He developed severe dyspnea in 12/17 and was admitted with acute systolic CHF.  Echo showed EF 20-25% with severe MR.  He was diuresed and discharged.   TEE was done in 5/18 to assess mitral valve.  There was severe MR.  The MV was thickened and did not coapt well.  Suspect there is a component of secondary MR with moderate LV dilation, however the thickened leaflets suggested a component of primary MR as well.   He had right and left heart cath in 6/18.  No significant CAD, elevated filling pressures.  He has been diuresing since then.  Since last appointment, weight is down 16 lbs.  BP is mildly elevated. He feels much better generally, no dyspnea except with heavy exertion.  No orthopnea/PND.    ECG (12/17): NSR, diffuse nonspecific T wave changes (QRS is not wide).   Labs (4/18): K 3.7, creatinine 3.05, hgb 10.4 Labs (5/18): K 3.3, creatinine 2.89, BNP 789 Labs (6/18): K 3.4, creatinine 2.4, hgb 10.1  PMH: 1. Gout 2. HTN: Long-standing, poor control. 3. CKD: Stage 3-4, followed by Dr. [name].  Likely hypertensive nephropathy.  4. Dilated cardiomyopathy: May be due to long-standing HTN.  - Echo (1/18): EF 20-25%, severe MR - Echo (2/18): EF 25%, severe MR.  - TEE (5/18): Moderate LV dilation with severe global hypokinesis, EF 25-30%, normal RV size with mildly decreased systolic function, RV-RA gradient 55 mmHg, severe MR with mildly thickened leaflets with inadequate coaptation and ERO 0.52 cm^2 by PISA and systolic flow reversal in the pulmonary vein doppler pattern => secondary MR from dilated LV but thickened leaflets lead to concern for component of primary MR as well.  - RHC/LHC (6/18): No significant CAD.  Mean RA  15, PA 76/31 mean 48, mean PCWP 45, CI 3.02 Fick, 2.64 thermo.  5. Severe MR: See TEE report above.   SH: Married, lives in [place], nonsmoker, no drugs, occasional ETOH.  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   Family History  Problem Relation Age of Onset  . Diabetes Mother   . Hypertension Mother   . Heart failure Mother    ROS: All systems reviewed and negative except as per HPI.   Current Outpatient Prescriptions  Medication Sig Dispense Refill  . albuterol (PROVENTIL HFA;VENTOLIN HFA) 108 (90 Base) MCG/ACT inhaler Inhale 2 puffs into the lungs every 6 (six) hours as needed for wheezing or shortness of breath.    Marland Kitchen albuterol (PROVENTIL) (5 MG/ML) 0.5% nebulizer solution Take 2.5 mg by nebulization every 6 (six) hours as needed for wheezing or shortness of breath.    Marland Kitchen amLODipine (NORVASC) 10 MG tablet Take 1 tablet (10 mg total) by mouth daily. 30 tablet 3  . aspirin EC 81 MG EC tablet Take 1 tablet (81 mg total) by mouth daily. 30 tablet 6  . carvedilol (COREG) 25 MG tablet Take 1 tablet (25 mg total) by mouth 2 (two) times daily. 180 tablet 3  . cholecalciferol (VITAMIN D) 1000 units tablet Take 1,000 Units by mouth daily.    . FeAspGl-FeFum-B12-FA-C-Succ Ac (FERREX 28 PO) Take 1 tablet by mouth daily.     . Febuxostat (ULORIC) 80  MG TABS Take 80 mg by mouth daily.     . furosemide (LASIX) 40 MG tablet Take 2 tablets in the AM and 1 tablet in the PM 90 tablet 3  . hydrALAZINE (APRESOLINE) 100 MG tablet Take 1 tablet (100 mg total) by mouth 3 (three) times daily. 90 tablet 3  . isosorbide mononitrate (IMDUR) 60 MG 24 hr tablet Take 1 tablet (60 mg total) by mouth daily. 30 tablet 3  . lisdexamfetamine (VYVANSE) 60 MG capsule Take 60 mg by mouth every morning.    . Multiple Vitamin (MULTIVITAMIN WITH MINERALS) TABS tablet Take 1 tablet by mouth daily.    . potassium chloride (K-DUR) 10 MEQ tablet Take 1 tablet (10 mEq  total) by mouth daily. 30 tablet 3  . valsartan (DIOVAN) 160 MG tablet Take 1 tablet (160 mg total) by mouth daily. 30 tablet 11   No current facility-administered medications for this encounter.    BP (!) 142/92   Pulse 76   Wt 197 lb 6.4 oz (89.5 kg)   SpO2 95%   BMI 28.32 kg/m  General: NAD Neck: JVP 7 cm, no thyromegaly or thyroid nodule.  Lungs: CTAB CV: Nondisplaced PMI.  Heart regular S1/S2, no S3/S4, 2/6 HSM apex.  1+ edema at ankle on right, 2+ edema 1/2 up lower leg on left.  No carotid bruit.  Normal pedal pulses.  Abdomen: Soft, nontender, no hepatosplenomegaly, no distention.  Skin: Intact without lesions or rashes.  Neurologic: Alert and oriented x 3.  Psych: Normal affect. Extremities: No clubbing or cyanosis.  HEENT: Normal.   Assessment/Plan: 1. HTN: BP is still mildly elevated.  Possible hypertensive cardiomyopathy and hypertensive nephropathy.   - Increase amlodipine to 10 mg daily.  2. CKD: Stage 4.  Followed by nephrology. Needs BMET today.  3. Chronic systolic CHF: EF 44-96% with moderate LV dilation and diffuse LV hypokinesis on TEE in 5/18. Nonischemic cardiomyopathy based on cath in 6/18.  Cause of cardiomyopathy may be long-standing, poorly controlled HTN.  NYHA class II symptoms.  Volume status looks much better today.  Weight down 16 lbs.   - Continue Lasix 80 qam/40 qpm.  BMET today.   - Continue hydralazine 100 mg tid and Imdur 60 mg daily.  - Continue Coreg 25 mg bid.  - Continue current valsartan as long as creatinine remains stable.  4. Mitral regurgitation: Severe.  See TEE report above.  I suspect there is a component of functional MR from dilated LV, but MV leaflets are thickened, and I am concerned for a component of primary MR as well.  He has seen Dr. Roxy Manns, and given the suspected component of primary MR, MV repair has been recommended => this is planned for 06/29/16.   Followup in 6 wks   Loralie Champagne 06/27/2016

## 2016-06-27 NOTE — Pre-Procedure Instructions (Addendum)
WOODFIN KISS  06/27/2016      [id number] - [place], [address] [place] [address] Phone: [phone] Fax: [phone]  [id number] - [place], [place] [address] [place] [address] Phone: [phone] Fax: [phone]  [place], [place] [address] Phone: [phone] Fax: [place], [place] [address] [place] [address] Phone: [phone] Fax: [phone]    Your procedure is scheduled on *06/29/16  Report to [facility] at 530 A.M.  Call this number if you have problems the morning of surgery:  [phone]   Remember:  Do not eat food or drink liquids after midnight.  Take these medicines the morning of surgery with A SIP OF WATER     Inhaler/nebulizer if needed(bring inhaler with you),amlodipine,carvedilol(coreg),uloric,hydralazine isosorbide(imdur)  STOP all herbel meds, nsaids (aleve,naproxen,advil,ibuprofen)   prior to surgery starting today 06/27/16 including all vitamins/supplements  aspirin per dr   Lazaro Arms not wear jewelry, make-up or nail polish.  Do not wear lotions, powders, or perfumes, or deoderant.  Do not shave 48 hours prior to surgery.  Men may shave face and neck.  Do not bring valuables to the hospital.  [facility] is not responsible for any belongings or valuables.  Contacts, dentures or bridgework may not be worn into surgery.  Leave your suitcase in the car.  After surgery it may be brought to your room.  For patients admitted to the hospital, discharge time will be determined by your treatment team.  Patients discharged the day of surgery will not be allowed to drive home.   Special instructions:   Special Instructions:  [place] - Preparing for Surgery  Before surgery, you can play an important role.  Because skin is not sterile, your skin needs to be as free of germs as possible.  You can reduce the number of germs on you skin by washing with CHG (chlorahexidine gluconate) soap before surgery.  CHG is an antiseptic cleaner which kills germs and bonds with the skin to continue killing germs even after washing.  Please DO NOT use if you have an allergy to CHG or antibacterial soaps.  If your skin becomes reddened/irritated stop using the CHG and inform your nurse when you arrive at Short Stay.  Do not shave (including legs and underarms) for at least 48 hours prior to the first CHG shower.  You may shave your face.  Please follow these instructions carefully:   1.  Shower with CHG Soap the night before surgery and the morning of Surgery.  2.  If you choose to wash your hair, wash your hair first as usual with your normal shampoo.  3.  After you shampoo, rinse your hair and body thoroughly to remove the Shampoo.  4.  Use CHG as you would any other liquid soap.  You can apply chg directly  to the skin and wash gently with scrungie or a clean washcloth.  5.  Apply the CHG Soap to your body ONLY FROM THE NECK DOWN.  Do not use on open wounds or open sores.  Avoid contact with your eyes ears, mouth and genitals (private parts).  Wash genitals (private parts)       with your normal soap.  6.  Wash thoroughly, paying special attention to the area where your surgery will be performed.  7.  Thoroughly rinse your body with warm water from the neck down.  8.  DO NOT shower/wash with your normal soap after using and rinsing off the CHG Soap.  9.  Pat yourself dry with a clean towel.            10.  Wear clean pajamas.            11.  Place clean sheets on your bed the night of your first shower and do not sleep with pets.  Day of Surgery  Do not apply any lotions/deodorants the morning of surgery.  Please wear clean  clothes to the hospital/surgery center.  Please read over the  fact sheets that you were given.

## 2016-06-28 ENCOUNTER — Encounter (HOSPITAL_COMMUNITY): Payer: Self-pay | Admitting: Certified Registered Nurse Anesthetist

## 2016-06-28 LAB — HEMOGLOBIN A1C
Hgb A1c MFr Bld: 5.1 % (ref 4.8–5.6)
Mean Plasma Glucose: 100 mg/dL

## 2016-06-28 MED ORDER — TRANEXAMIC ACID 1000 MG/10ML IV SOLN
1.5000 mg/kg/h | INTRAVENOUS | Status: AC
  Administered 2016-06-29: 1.5 mg/kg/h via INTRAVENOUS
  Filled 2016-06-28: qty 25

## 2016-06-28 MED ORDER — DEXTROSE 5 % IV SOLN
750.0000 mg | INTRAVENOUS | Status: DC
  Filled 2016-06-28: qty 750

## 2016-06-28 MED ORDER — KENNESTONE BLOOD CARDIOPLEGIA VIAL
13.0000 mL | Freq: Once | Status: DC
  Filled 2016-06-28: qty 1

## 2016-06-28 MED ORDER — MAGNESIUM SULFATE 50 % IJ SOLN
40.0000 meq | INTRAMUSCULAR | Status: DC
  Filled 2016-06-28: qty 10

## 2016-06-28 MED ORDER — TRANEXAMIC ACID (OHS) PUMP PRIME SOLUTION
2.0000 mg/kg | INTRAVENOUS | Status: DC
  Filled 2016-06-28: qty 1.78

## 2016-06-28 MED ORDER — SODIUM CHLORIDE 0.9 % IV SOLN
INTRAVENOUS | Status: AC
  Administered 2016-06-29: .6 [IU]/h via INTRAVENOUS
  Filled 2016-06-28: qty 1

## 2016-06-28 MED ORDER — DEXTROSE 5 % IV SOLN
0.0000 ug/min | INTRAVENOUS | Status: DC
  Filled 2016-06-28: qty 4

## 2016-06-28 MED ORDER — SODIUM CHLORIDE 0.9 % IV SOLN
30.0000 ug/min | INTRAVENOUS | Status: AC
  Administered 2016-06-29: 20 ug/min via INTRAVENOUS
  Filled 2016-06-28: qty 2

## 2016-06-28 MED ORDER — VANCOMYCIN HCL 10 G IV SOLR
1500.0000 mg | INTRAVENOUS | Status: AC
  Administered 2016-06-29: 1500 mg via INTRAVENOUS
  Filled 2016-06-28: qty 1500

## 2016-06-28 MED ORDER — KENNESTONE BLOOD CARDIOPLEGIA (KBC) MANNITOL SYRINGE (20%, 32ML)
32.0000 mL | Freq: Once | INTRAVENOUS | Status: DC
  Filled 2016-06-28: qty 1

## 2016-06-28 MED ORDER — POTASSIUM CHLORIDE 2 MEQ/ML IV SOLN
80.0000 meq | INTRAVENOUS | Status: DC
  Filled 2016-06-28: qty 40

## 2016-06-28 MED ORDER — LIDOCAINE HCL (CARDIAC) 20 MG/ML IV SOLN
260.0000 mg | INTRAVENOUS | Status: DC
  Filled 2016-06-28: qty 15

## 2016-06-28 MED ORDER — NITROGLYCERIN IN D5W 200-5 MCG/ML-% IV SOLN
2.0000 ug/min | INTRAVENOUS | Status: DC
  Filled 2016-06-28: qty 250

## 2016-06-28 MED ORDER — PAPAVERINE HCL 30 MG/ML IJ SOLN
INTRAMUSCULAR | Status: DC
Start: 2016-06-29 — End: 2016-06-29
  Filled 2016-06-28: qty 2.5

## 2016-06-28 MED ORDER — METOPROLOL TARTRATE 12.5 MG HALF TABLET: 12.5000 mg | ORAL_TABLET | Freq: Once | ORAL | Status: DC

## 2016-06-28 MED ORDER — DEXTROSE 5 % IV SOLN
1.5000 g | INTRAVENOUS | Status: AC
  Administered 2016-06-29: .75 g via INTRAVENOUS
  Administered 2016-06-29: 1.5 g via INTRAVENOUS
  Filled 2016-06-28: qty 1.5

## 2016-06-28 MED ORDER — MANNITOL 20% IV SOLUTION 10G/50ML
6.4000 g | INTRAVENOUS | Status: DC
  Filled 2016-06-28: qty 60

## 2016-06-28 MED ORDER — DEXMEDETOMIDINE HCL IN NACL 400 MCG/100ML IV SOLN
0.1000 ug/kg/h | INTRAVENOUS | Status: AC
  Administered 2016-06-29: .5 ug/kg/h via INTRAVENOUS
  Filled 2016-06-28: qty 100

## 2016-06-28 MED ORDER — DOPAMINE-DEXTROSE 3.2-5 MG/ML-% IV SOLN
0.0000 ug/kg/min | INTRAVENOUS | Status: DC
  Filled 2016-06-28: qty 250

## 2016-06-28 MED ORDER — SODIUM CHLORIDE 0.9 % IV SOLN
INTRAVENOUS | Status: DC
  Filled 2016-06-28: qty 30

## 2016-06-28 MED ORDER — VANCOMYCIN HCL 1000 MG IV SOLR
INTRAVENOUS | Status: AC
  Administered 2016-06-29: 1000 mL
  Filled 2016-06-28: qty 1000

## 2016-06-28 MED ORDER — TRANEXAMIC ACID (OHS) BOLUS VIA INFUSION
15.0000 mg/kg | INTRAVENOUS | Status: AC
  Administered 2016-06-29: 1332 mg via INTRAVENOUS
  Filled 2016-06-28: qty 1332

## 2016-06-28 MED ORDER — GLUTARALDEHYDE 0.625% SOAKING SOLUTION
TOPICAL | Status: DC | PRN
Start: 2016-06-29 — End: 2016-06-29
  Filled 2016-06-28: qty 50

## 2016-06-28 MED ORDER — CHLORHEXIDINE GLUCONATE 0.12 % MT SOLN
15.0000 mL | Freq: Once | OROMUCOSAL | Status: AC
  Administered 2016-06-29: 15 mL via OROMUCOSAL
  Filled 2016-06-28: qty 15

## 2016-06-28 NOTE — Anesthesia Preprocedure Evaluation (Addendum)
Anesthesia Evaluation  Patient identified by MRN, date of birth, ID band Patient awake    Reviewed: Allergy & Precautions, H&P , NPO status , Patient's Chart, lab work & pertinent test results  Airway Mallampati: II  TM Distance: >3 FB Neck ROM: Full    Dental  (+) Teeth Intact, Dental Advisory Given   Pulmonary neg pulmonary ROS, asthma , sleep apnea ,    breath sounds clear to auscultation       Cardiovascular Exercise Tolerance: Good +CHF and + DOE  negative cardio ROS  + Valvular Problems/Murmurs MR  Rhythm:Regular Rate:Normal     Neuro/Psych negative neurological ROS  negative psych ROS   GI/Hepatic negative GI ROS, Neg liver ROS,   Endo/Other  negative endocrine ROS  Renal/GU Renal InsufficiencyRenal diseasenegative Renal ROS  negative genitourinary   Musculoskeletal  (+) Arthritis , Osteoarthritis,    Abdominal   Peds  Hematology negative hematology ROS (+) anemia ,   Anesthesia Other Findings   Reproductive/Obstetrics negative OB ROS                           Anesthesia Physical Anesthesia Plan  ASA: IV  Anesthesia Plan: General   Post-op Pain Management:    Induction: Intravenous  PONV Risk Score and Plan: 2 and Treatment may vary due to age or medical condition and Midazolam  Airway Management Planned: Double Lumen EBT  Additional Equipment: Arterial line, CVP, PA Cath, TEE, 3D TEE and Ultrasound Guidance Line Placement  Intra-op Plan:   Post-operative Plan: Post-operative intubation/ventilation  Informed Consent: I have reviewed the patients History and Physical, chart, labs and discussed the procedure including the risks, benefits and alternatives for the proposed anesthesia with the patient or authorized representative who has indicated his/her understanding and acceptance.   Dental advisory given  Plan Discussed with: CRNA  Anesthesia Plan Comments:         Anesthesia Quick Evaluation

## 2016-06-28 NOTE — H&P (Signed)
[address]       [place] [address]             [phone]          CARDIOTHORACIC SURGERY HISTORY AND PHYSICAL EXAM  Referring Provider is Larey Dresser, MD  Primary Cardiologist is Sueanne Margarita, MD PCP is [name], MD      Chief Complaint  Patient presents with  . Congestive Heart Failure    eval for MVR...ECHO 04/21/16, TEE 05/04/16  . Mitral Regurgitation    SEVERE with PROLAPSE    HPI:  Patient is a 51 year old African-American male with history of mitral regurgitation, severe left ventricular systolic dysfunction with chronic systolic congestive heart failure, long-standing poorly controlled hypertension, chronic kidney disease (stage 3-4), and OSA who has been referred for surgical consultation to discuss treatment options for management of severe symptomatic mitral regurgitation. The patient states that he has been told for most of his life that he had a heart murmur but he had never previously undergone any type of cardiac diagnostic evaluation until recently. He has long-standing history of hypertension, chronic kidney disease, and morbid obesity. He underwent gastric bypass surgery approximately 3 years ago and successfully lost a large amount of weight.  He was noted to have severe hypertension and chronic kidney disease for which she has been followed for several years by Dr. [name].  Several months ago the patient began to develop worsening shortness of breath and lower extremity edema. He ultimately was hospitalized from 01/02/2016 through 01/06/2016 with accelerated hypertension and acute systolic congestive heart failure, NYHA class IV.  Transthoracic echocardiogram performed at that time revealed severe left ventricular systolic dysfunction with ejection fraction estimated 20-25%. There was severe mitral regurgitation, severe left atrial enlargement and moderately elevated pulmonary artery pressures.  He improved clinically with  medical treatment of hypertension and volume overload. Nuclear medicine stress test revealed perfusion defects consistent with previous septal and inferior wall myocardial infarctions but no reversible defects to suggest ischemia. There was global hypokinesis with ejection fraction calculated 33%. Diagnostic cardiac catheterization was not performed.  The patient has been followed ever since by Dr. Radford Pax, and repeat echocardiogram performed 02/04/2016 revealed ejection fraction 25% with severe mitral regurgitation and moderate tricuspid regurgitation. Renal function remained stable with serum creatinine approximately 2.5. Clinically the patient did well on medical therapy improved symptoms of exertional shortness of breath and stable volume status.  The patient was subsequently referred to [name] in the advanced heart failure clinic to perform follow-up transesophageal echocardiogram with the patient on optimal medical therapy. TEE performed 05/04/2016 revealed severe mitral regurgitation with severe left ventricular systolic dysfunction. Concerns were raised regarding the possibility that the patient might have combination of both primary and secondary mitral regurgitation, and was subsequently referred for elective surgical consultation.  The patient lives locally in [place] and just graduated from Sports [employer] school. 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 He is separated from his wife and headed for divorce. He has 2 adult children. He has a long-standing history of severe hypertension and admits that for many years he was not good about taking medications or taking care of himself. Although he was physically active and played football in college, shortly after that he became morbidly obese. 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 Approximately 3  years ago he underwent gastric  bypass surgery and lost a tremendous amount of weight. He currently weighs approximately 225 pounds and is 5 foot 10 inches tall. He states that he was told during his youth that he had a heart murmur but that it was nothing to worry about. He never had any kind of cardiac evaluation until several months ago when he presented with acute systolic congestive heart failure as noted above. Since hospital discharge in January the patient has been compliant with medical therapy and following up regularly with all of his doctors. He has done well clinically and remained quite stable. He admits that he does get short of breath with more strenuous exertion but this does not limit his daily activities. He tries to walk every day. He denies any resting shortness of breath, PND, orthopnea, dizzy spells, or syncope. He still has some intermittent lower extremity edema and he reports occasional palpitations. He has intermittent wheezing that has been attributed to asthma. He has sleep apnea but doesn't use his CPAP very often.   Patient returns to the office today for follow-up of stage D severe symptomatic mitral regurgitation. He was originally seen in consultation on 05/25/2016. Since then he underwent left and right heart catheterization by [name]. He returns to the office today to review the results of his catheterization and discuss treatment options further. He states that he continues to improve with medical therapy. He has lost several more pounds in weight with diuretic therapy and further adjustment of his antihypertensive medications by [name]. He still gets short of breath with activity but he is feeling better and his renal function has remained stable.     Past Medical History:  Diagnosis Date  . Anemia, chronic disease   . Arthritis    Gout  . Asthma   . Cardiomyopathy ([place])   . CHF (congestive heart failure) ([place])    a. 01/2016: echo showing EF of 20-25%, no WMA, severe MR, and PA Peak  Pressure of 52 mm Hg.   Marland Kitchen Chronic systolic HF (heart failure) ([place])   . CKD (chronic kidney disease)    Dr [name]  . Elbow pain, left   . Heart murmur   . History of kidney stones   . Knee pain, bilateral   . Left ankle pain 11/04/2010   Gout  . Leg pain, bilateral   . Peripheral edema    Bilateral lower extremity   . Pneumonia    hx  . Pulmonary HTN ([place])   . Severe mitral regurgitation   . Sleep apnea    hx bariatric  surgery 3 yrs ago lost 166 lbs  . Venous insufficiency     Past Surgical History:  Procedure Laterality Date  . GASTRIC BYPASS  2014  . MENISCUS REPAIR Right 05/2008   right  knee  . RIGHT/LEFT HEART CATH AND CORONARY ANGIOGRAPHY N/A 06/07/2016   Procedure: Right/Left Heart Cath and Coronary Angiography;  Surgeon: Larey Dresser, MD;  Location: [facility];  Service: Cardiovascular;  Laterality: N/A;  . TEE WITHOUT CARDIOVERSION N/A 05/04/2016   Procedure: TRANSESOPHAGEAL ECHOCARDIOGRAM (TEE);  Surgeon: Larey Dresser, MD;  Location[facility];  Service: Cardiovascular;  Laterality: N/A;    Family History  Problem Relation Age of Onset  . Diabetes Mother   . Hypertension Mother   . Heart failure Mother     Social History Social History  Substance Use Topics  . Smoking status: Never Smoker  . Smokeless  tobacco: Never Used  . Alcohol use Yes     Comment: socially    Prior to Admission medications   Medication Sig Start Date End Date Taking? Authorizing Provider  albuterol (PROVENTIL HFA;VENTOLIN HFA) 108 (90 Base) MCG/ACT inhaler Inhale 2 puffs into the lungs every 6 (six) hours as needed for wheezing or shortness of breath.    [provider]  albuterol (PROVENTIL) (5 MG/ML) 0.5% nebulizer solution Take 2.5 mg by nebulization every 6 (six) hours as needed for wheezing or shortness of breath.    [provider]  amLODipine (NORVASC) 10 MG tablet Take 1 tablet (10 mg total) by mouth daily. 06/26/16   Larey Dresser, MD   aspirin EC 81 MG EC tablet Take 1 tablet (81 mg total) by mouth daily. 06/08/16   Shirley Friar, PA-C  carvedilol (COREG) 25 MG tablet Take 1 tablet (25 mg total) by mouth 2 (two) times daily. 01/13/16 05/31/18  Consuelo Pandy, PA-C  cholecalciferol (VITAMIN D) 1000 units tablet Take 1,000 Units by mouth daily.    [provider]  FeAspGl-FeFum-B12-FA-C-Succ Ac (FERREX 28 PO) Take 1 tablet by mouth daily.     [provider]  Febuxostat (ULORIC) 80 MG TABS Take 80 mg by mouth daily.     [provider]  furosemide (LASIX) 40 MG tablet Take 2 tablets in the AM and 1 tablet in the PM 05/22/16   Larey Dresser, MD  hydrALAZINE (APRESOLINE) 100 MG tablet Take 1 tablet (100 mg total) by mouth 3 (three) times daily. 05/30/16   Larey Dresser, MD  isosorbide mononitrate (IMDUR) 60 MG 24 hr tablet Take 1 tablet (60 mg total) by mouth daily. 05/30/16 05/30/17  Larey Dresser, MD  lisdexamfetamine (VYVANSE) 60 MG capsule Take 60 mg by mouth every morning.    [provider]  Multiple Vitamin (MULTIVITAMIN WITH MINERALS) TABS tablet Take 1 tablet by mouth daily.    [provider]  potassium chloride (K-DUR) 10 MEQ tablet Take 1 tablet (10 mEq total) by mouth daily. 05/22/16   Larey Dresser, MD  valsartan (DIOVAN) 160 MG tablet Take 1 tablet (160 mg total) by mouth daily. 06/09/16   Shirley Friar, PA-C    Allergies  Allergen Reactions  . No Known Allergies      Review of Systems:              General:                      normal appetite, decreased energy, no weight gain, no weight loss, no fever             Cardiac:                       no chest pain with exertion, no chest pain at rest, +SOB with exertion, no resting SOB, no PND, no orthopnea, + palpitations, no arrhythmia, no atrial fibrillation, + LE edema, no dizzy spells, no syncope             Respiratory:                 + exertional shortness of breath, no home oxygen, no  productive cough, no dry cough, no bronchitis, + wheezing, no hemoptysis, + asthma, no pain with inspiration or cough, + sleep apnea, + CPAP at night             GI:  no difficulty swallowing, no reflux, no frequent heartburn, no hiatal hernia, no abdominal pain, no constipation, no diarrhea, no hematochezia, no hematemesis, no melena             GU:                              no dysuria,  no frequency, no urinary tract infection, no hematuria, + enlarged prostate, no kidney stones, + kidney disease             Vascular:                     no pain suggestive of claudication, + pain in feet, no leg cramps, no varicose veins, no DVT, no non-healing foot ulcer             Neuro:                         no stroke, no TIA's, no seizures, no headaches, no temporary blindness one eye,  no slurred speech, no peripheral neuropathy, no chronic pain, no instability of gait, no memory/cognitive dysfunction             Musculoskeletal:         no arthritis, + joint swelling, no myalgias, no difficulty walking, normal mobility              Skin:                            no rash, no itching, no skin infections, no pressure sores or ulcerations             Psych:                         no anxiety, no depression, no nervousness, + unusual recent stress             Eyes:                           no blurry vision, no floaters, no recent vision changes, + wears glasses or contacts             ENT:                            no hearing loss, + loose or painful teeth, no dentures, last saw dentist 1 year ago             Hematologic:               no easy bruising, no abnormal bleeding, no clotting disorder, no frequent epistaxis             Endocrine:                   no diabetes, does not check CBG's at home                           Physical Exam:              BP (!) 163/122 (BP Location: Left Arm, Patient Position: Sitting, Cuff Size: Large)   Pulse 77   Resp 16   Ht 5\' 10"   (1.778 m)   Wt  225 lb (102.1 kg)   SpO2 98% Comment: ON RA  BMI 32.28 kg/m              General:                      Well-appearing AA male NAD             HEENT:                       Unremarkable              Neck:                           no JVD, no bruits, no adenopathy              Chest:                          clear to auscultation, symmetrical breath sounds, no wheezes, no rhonchi              CV:                              RRR, grade III/VI blowing holosystolic murmur best LLSB             Abdomen:                    soft, non-tender, no masses              Extremities:                 warm, well-perfused, pulses diminished but palpable, mild LE edema             Rectal/GU                   Deferred             Neuro:                         Grossly non-focal and symmetrical throughout             Skin:                            Clean and dry, no rashes, no breakdown   Diagnostic Tests:  Transthoracic Echocardiography  Patient: Jadarian, Mckay MR #: [id number] Study Date: 01/03/2016 Gender: M Age: 65 Height: 177.8 cm Weight: 108.9 kg BSA: 2.36 m^2 Pt. Status: Room: [place], [place] SONOGRAPHER [address] ADMITTING Cristal Ford [id number] YTKZSWFU XNATFTD, DUKGURK [id number] SEGBTDVVO HYWVPXT, GGYIRSW [id number] PERFORMING [facility], Inpatient  cc:  ------------------------------------------------------------------- LV EF: 20% - 25%  ------------------------------------------------------------------- Indications: CHF - 428.0.  ------------------------------------------------------------------- History: PMH: DOE. CKD. Accelerated hypertension. Asthma. Anemia.  ------------------------------------------------------------------- Study Conclusions  - Left ventricle: The cavity size was mildly dilated. There was mild concentric hypertrophy. Systolic  function was severely reduced. The estimated ejection fraction was in the range of 20% to 25%. Wall motion was normal; there were no regional wall motion abnormalities. - Aortic root: The aortic root was mildly dilated. - Mitral valve: There was severe regurgitation directed eccentrically and toward the free wall. - Left atrium: The atrium was severely dilated. - Right atrium: The atrium was  mildly to moderately dilated. - Tricuspid valve: There was moderate regurgitation. - Pulmonary arteries: Systolic pressure was moderately increased. PA peak pressure: 52 mm Hg (S).  ------------------------------------------------------------------- Study data: No prior study was available for comparison. Study status: Routine. Procedure: The patient reported no pain pre or post test. Transthoracic echocardiography. Image quality was adequate. Study completion: There were no complications. Transthoracic echocardiography. M-mode, complete 2D, spectral Doppler, and color Doppler. Birthdate: Patient birthdate: 04-Jul-1965. Age: Patient is 51 yr old. Sex: Gender: male. BMI: 34.5 kg/m^2. Blood pressure: 125/90 Patient status: Inpatient. Study date: Study date: 01/03/2016. Study time: 09:22 AM. Location: Bedside.  -------------------------------------------------------------------  ------------------------------------------------------------------- Left ventricle: The cavity size was mildly dilated. There was mild concentric hypertrophy. Systolic function was severely reduced. The estimated ejection fraction was in the range of 20% to 25%. Wall motion was normal; there were no regional wall motion abnormalities.  ------------------------------------------------------------------- Aortic valve: Trileaflet; normal thickness leaflets. Mobility was not restricted. Doppler: Transvalvular velocity was within the normal range. There was no stenosis. There was no  regurgitation.  ------------------------------------------------------------------- Aorta: Aortic root: The aortic root was mildly dilated.  ------------------------------------------------------------------- Mitral valve: Structurally normal valve. Mobility was not restricted. Doppler: Transvalvular velocity was within the normal range. There was no evidence for stenosis. There was severe regurgitation directed eccentrically and toward the free wall. Peak gradient (D): 4 mm Hg.  ------------------------------------------------------------------- Left atrium: The atrium was severely dilated.  ------------------------------------------------------------------- Right ventricle: The cavity size was normal. Wall thickness was normal. Systolic function was normal.  ------------------------------------------------------------------- Pulmonic valve: Doppler: Transvalvular velocity was within the normal range. There was no evidence for stenosis.  ------------------------------------------------------------------- Tricuspid valve: Structurally normal valve. Doppler: Transvalvular velocity was within the normal range. There was moderate regurgitation.  ------------------------------------------------------------------- Pulmonary artery: The main pulmonary artery was normal-sized. Systolic pressure was moderately increased.  ------------------------------------------------------------------- Right atrium: The atrium was mildly to moderately dilated.  ------------------------------------------------------------------- Pericardium: There was no pericardial effusion.  ------------------------------------------------------------------- Systemic veins: Inferior vena cava: The vessel was normal in size.  ------------------------------------------------------------------- Measurements  Left ventricle Value Reference LV  ID, ED, PLAX chordal (H) 67 mm 43 - 52 LV ID, ES, PLAX chordal (H) 61.9 mm 23 - 38 LV fx shortening, PLAX chordal (L) 8 % >=29 LV PW thickness, ED 11.6 mm ---------- IVS/LV PW ratio, ED 0.91 <=1.3 Stroke volume, 2D 73 ml ---------- Stroke volume/bsa, 2D 31 ml/m^2 ---------- LV ejection fraction, 1-p A4C 30 % ---------- LV end-diastolic volume, 2-p 419 ml ---------- LV end-systolic volume, 2-p 379 ml ---------- LV ejection fraction, 2-p 27 % ---------- Stroke volume, 2-p 69 ml ---------- LV end-diastolic volume/bsa, 2-p 024 ml/m^2 ---------- LV end-systolic volume/bsa, 2-p 78 ml/m^2 ---------- Stroke volume/bsa, 2-p 29.3 ml/m^2 ---------- LV e&', lateral 10.3 cm/s ---------- LV E/e&', lateral 9.7 ---------- LV e&', medial 5.4 cm/s ---------- LV E/e&', medial 18.5 ---------- LV e&', average 7.85 cm/s ---------- LV E/e&', average 12.73 ----------  Ventricular septum Value Reference IVS thickness, ED 10.5 mm ----------  LVOT Value Reference LVOT ID, S 27 mm ---------- LVOT area 5.73 cm^2 ---------- LVOT peak velocity, S 69.5 cm/s ---------- LVOT mean velocity, S 50.8 cm/s ---------- LVOT VTI, S 12.7 cm ----------  Aorta  Value Reference Aortic root ID, ED 38 mm ----------  Left atrium Value Reference LA ID, A-P, ES 49 mm ---------- LA ID/bsa, A-P 2.08 cm/m^2 <=2.2 LA volume, S 235 ml ---------- LA volume/bsa, S 99.7 ml/m^2 ---------- LA volume, ES, 1-p A4C 221 ml ---------- LA volume/bsa, ES, 1-p A4C 93.8 ml/m^2 ---------- LA  volume, ES, 1-p A2C 243 ml ---------- LA volume/bsa, ES, 1-p A2C 103.1 ml/m^2 ----------  Mitral valve Value Reference Mitral E-wave peak velocity 99.9 cm/s ---------- Mitral A-wave peak velocity 40.3 cm/s ---------- Mitral deceleration time (L) 148 ms 150 - 230 Mitral peak gradient, D 4 mm Hg ---------- Mitral E/A ratio, peak 2.5 ---------- Mitral regurg VTI, PISA 154 cm ---------- Mitral ERO, PISA 0.4 cm^2 ---------- Mitral regurg volume, PISA 62 ml ----------  Pulmonary arteries Value Reference PA pressure, S, DP (H) 52 mm Hg <=30  Tricuspid valve Value Reference Tricuspid regurg peak velocity 305 cm/s ---------- Tricuspid peak RV-RA gradient 37 mm Hg ----------  Right atrium Value Reference RA ID, S-I, ES, A4C (H) 70.8 mm 34 - 49 RA area, ES, A4C (H) 29.3 cm^2 8.3 - 19.5 RA volume, ES, A/L 100 ml ---------- RA volume/bsa, ES,  A/L 42.4 ml/m^2 ----------  Systemic veins Value Reference Estimated CVP 15 mm Hg ----------  Right ventricle Value Reference TAPSE 19.1 mm ---------- RV pressure, S, DP (H) 52 mm Hg <=30 RV s&', lateral, S 10.6 cm/s ----------  Legend: (L) and (H) mark values outside specified reference range.  ------------------------------------------------------------------- Prepared and Electronically Authenticated by  Ezzard Standing, MD [facility] 2018-01-01T11:39:30    MYOCARDIAL IMAGING WITH SPECT (REST AND PHARMACOLOGIC-STRESS)  GATED LEFT VENTRICULAR WALL MOTION STUDY  LEFT VENTRICULAR EJECTION FRACTION  TECHNIQUE: Standard myocardial SPECT imaging was performed after resting intravenous injection of 10 mCi Tc-66m tetrofosmin. Subsequently, intravenous infusion of Lexiscan was performed under the supervision of the Cardiology staff. At peak effect of the drug, 30 mCi Tc-93m tetrofosmin was injected intravenously and standard myocardial SPECT imaging was performed. Quantitative gated imaging was also performed to evaluate left ventricular wall motion, and estimate left ventricular ejection fraction.  COMPARISON: None.  FINDINGS: Perfusion: Large perfusion defects are identified in the septum and inferior walls. No other perfusion abnormalities.  Wall Motion: No thickening in the region of the large perfusion defects in the septum and inferior wall. Global hypokinesis.  Left Ventricular Ejection Fraction: 33 %  End diastolic volume 631 ml  End systolic volume 497 ml  IMPRESSION: 1. Septal and inferior wall infarcts. No reversible defects to suggest ischemia.  2. Global hypokinesis. No wall thickening in the region of the septal or inferior wall  infarcts.  3. Left ventricular ejection fraction 33%  4. Non invasive risk stratification*: High  *2012 Appropriate Use Criteria for Coronary Revascularization Focused Update: J Am Coll Cardiol. 0263;78(5):885-027. http://content.airportbarriers.com.aspx?articleid=1201161   Electronically Signed By: Dorise Bullion III M.D On: 01/06/2016 13:09    Transthoracic Echocardiography  Patient: Elzy, Tomasello MR #: [id number] Study Date: 04/21/2016 Gender: M Age: 34 Height: 177.8 cm Weight: 102.5 kg BSA: 2.28 m^2 Pt. Status: Room:  ATTENDING Fransico Him, MD ORDERING Fransico Him, MD [place], MD SONOGRAPHER Victorio Palm, RDCS PERFORMING [facility], Outpatient  cc:  ------------------------------------------------------------------- LV EF: 20% - 25%  ------------------------------------------------------------------- Indications: (I50.22). (I42.0).  ------------------------------------------------------------------- History: PMH: Acquired from the patient and from the patient&'s chart. Bilateral lower extremity edema. Dilated cardiomyopathy, with congestive heart failure, with an ejection fraction of 26%by echocardiography. The dysfunction is both systolic and diastolic. Severe mitral regurgitation. Moderate tricuspid regurgitation. Renal disease. Primary pulmonary hypertension.  ------------------------------------------------------------------- Study Conclusions  - Left ventricle: The cavity size was severely dilated. Wall thickness was normal. Systolic function was severely reduced. The estimated ejection fraction was in the range of 20% to 25%. Diffuse hypokinesis. The study is not technically sufficient to allow evaluation of  LV diastolic function. - Aortic valve: There was mild regurgitation. - Mitral valve: There was severe regurgitation  directed eccentrically and posteriorly. - Left atrium: The atrium was severely dilated. - Right ventricle: Systolic function was moderately reduced. - Right atrium: The atrium was mildly dilated. - Tricuspid valve: There was mild-moderate regurgitation. - Pulmonary arteries: Systolic pressure was severely increased. PA peak pressure: 80 mm Hg (S). - Pericardium, extracardiac: A trivial pericardial effusion was identified.  Impressions:  - Severe global reduction in LV function; severe LVE; mildly thickened aortic valve with mild AI; probable prolapse of anterior MV leaflet with severe, eccentric MR; severe LAE; mild RAE; moderately reduced RV function; mild to moderate TR; severely elevated pulmonary pressure; suggest TEE to better assess MR. Note, pt developed tachycardia (HR 120) during the study that converted to sinus prior to completion; consider holter monitor.  ------------------------------------------------------------------- Labs, prior tests, procedures, and surgery: Echocardiography (February 2018). The mitral valve showed severe regurgitation. The tricuspid valve showed moderate regurgitation. EF was 25%.  Gastric bypass (2014).  ------------------------------------------------------------------- Study data: Comparison was made to the study of February 2018. Study status: Routine. Procedure: The patient reported no pain pre or post test. Transthoracic echocardiography for left ventricular function evaluation, for right ventricular function evaluation, for assessment of valvular function, and for evaluation of pulmonary pressures. Image quality was adequate. Study completion: There were no complications. Transthoracic echocardiography. M-mode, complete 2D, spectral Doppler, and color Doppler. Birthdate: Patient birthdate: [birth year]. Age: Patient is 51 yr old. Sex: Gender: male. BMI: 32.4 kg/m^2.  Blood pressure: 128/78 Patient status: Outpatient. Study date: Study date: 04/21/2016. Study time: 07:28 AM. Location: [facility]  -------------------------------------------------------------------  ------------------------------------------------------------------- Left ventricle: The cavity size was severely dilated. Wall thickness was normal. Systolic function was severely reduced. The estimated ejection fraction was in the range of 20% to 25%. Diffuse hypokinesis. The study is not technically sufficient to allow evaluation of LV diastolic function.  ------------------------------------------------------------------- Aortic valve: Trileaflet; mildly thickened leaflets. Cusp separation was normal. Doppler: Transvalvular velocity was within the normal range. There was no stenosis. There was mild regurgitation.  ------------------------------------------------------------------- Aorta: Aortic root: The aortic root was normal in size.  ------------------------------------------------------------------- Mitral valve: Structurally normal valve. Leaflet separation was normal. Doppler: Transvalvular velocity was within the normal range. There was no evidence for stenosis. There was severe regurgitation directed eccentrically and posteriorly.  ------------------------------------------------------------------- Left atrium: The atrium was severely dilated.  ------------------------------------------------------------------- Right ventricle: The cavity size was normal. Systolic function was moderately reduced.  ------------------------------------------------------------------- Pulmonic valve: Structurally normal valve. Cusp separation was normal. Doppler: Transvalvular velocity was within the normal range. There was mild regurgitation.  ------------------------------------------------------------------- Tricuspid valve: Structurally  normal valve. Leaflet separation was normal. Doppler: Transvalvular velocity was within the normal range. There was mild-moderate regurgitation.  ------------------------------------------------------------------- Pulmonary artery: Systolic pressure was severely increased.  ------------------------------------------------------------------- Right atrium: The atrium was mildly dilated.  ------------------------------------------------------------------- Pericardium: A trivial pericardial effusion was identified.  ------------------------------------------------------------------- Systemic veins: Inferior vena cava: The vessel was dilated.  ------------------------------------------------------------------- Measurements  Left ventricle Value Reference LV ID, ED, PLAX chordal (H) 74.9 mm 43 - 52 LV ID, ES, PLAX chordal (H) 65.4 mm 23 - 38 LV fx shortening, PLAX chordal (L) 13 % >=29 LV PW thickness, ED 11.2 mm --------- IVS/LV PW ratio, ED 1 <=1.3 Stroke volume, 2D 48 ml --------- Stroke volume/bsa, 2D 21 ml/m^2 --------- LV e&', medial 4.68 cm/s ---------  Ventricular septum Value Reference IVS thickness, ED 11.2 mm ---------  LVOT Value Reference LVOT ID, S  27 mm --------- LVOT area 5.73 cm^2 --------- LVOT ID 27 mm --------- LVOT peak velocity, S 71.7 cm/s --------- LVOT mean velocity, S 46.2 cm/s --------- LVOT VTI, S 8.35 cm --------- Stroke volume (SV), LVOT DP  47.8 ml --------- Stroke index (SV/bsa), LVOT DP 21 ml/m^2 ---------  Aorta Value Reference Aortic root ID, ED 33 mm --------- Ascending aorta ID, A-P, S 36 mm ---------  Left atrium Value Reference LA ID, A-P, ES 56 mm --------- LA ID/bsa, A-P (H) 2.45 cm/m^2 <=2.2 LA volume, S 124 ml --------- LA volume/bsa, S 54.3 ml/m^2 --------- LA volume, ES, 1-p A4C 118 ml --------- LA volume/bsa, ES, 1-p A4C 51.7 ml/m^2 --------- LA volume, ES, 1-p A2C 117 ml --------- LA volume/bsa, ES, 1-p A2C 51.3 ml/m^2 ---------  Mitral valve Value Reference Aliasing velocity, MR PISA 31.3 cm/s --------- Mitral regurg PISA radius 1.3 mm --------- Mitral regurg VTI, PISA 148 cm --------- Mitral ERO, PISA 0.61 cm^2 --------- Mitral regurg volume, PISA 90 ml --------- Mitral regurg fraction, PISA 65.31 % ---------  Pulmonary arteries Value Reference PA pressure, S, DP (H) 80 mm Hg <=30  Tricuspid valve Value Reference Tricuspid regurg peak velocity 402 cm/s --------- Tricuspid peak RV-RA gradient 65 mm Hg ---------  Systemic veins Value Reference Estimated CVP 15 mm Hg ---------  Right ventricle Value Reference TAPSE 18.3 mm --------- RV pressure, S, DP (H) 80  mm Hg <=30 RV s&', lateral, S 6.2 cm/s ---------  Pulmonic valve Value Reference Pulmonic regurg velocity, ED 229 cm/s --------- Pulmonic regurg gradient, ED 21 mm Hg ---------  Legend: (L) and (H) mark values outside specified reference range.  ------------------------------------------------------------------- Prepared and Electronically Authenticated by  Kirk Ruths 2018-04-20T12:39:30   Procedure: TEE  Indication: Mitral regurgitation.   Sedation: 7 mg IV Versed, 50 mcg IV Fentanyl. Patient received hydralazine 40 mg IV total for elevated BP.   Findings: Please see echo section for full report. The left ventricle was moderately dilated with normal wall thickness. Severe global hypokinesis, estimate EF 25-30%. Normal RV size with mildly decreased systolic function. Moderate left atrial enlargement, no LAA thrombus. Mild right atrial enlargement. Mild TR with peak RV-RA gradient 55 mmHg. Trileaflet aortic valve with trivial AI, no aortic stenosis. The mitral valve was mildly thickened especially towards the tip of the anterior leaflet. There was inadequate coaptation of the leaflets with severe posterioly-directed mitral regurgitation. ERO 0.52 cm^2. There was systolic flow reversal on the pulmonary vein doppler pattern. The aorta was normal in caliber with mild plaque in the descending thoracic aorta. No ASD/PFO, bubble study negative.   Impression: Severe mitral regurgitation. Suspect there is a component of secondary MR from dilated LV, but there is thickening of the leaflets, possible primary MR component as well.   Loralie Champagne 05/04/2016 9:43 AM  Right/Left Heart Cath and Coronary Angiography  Conclusion   1. No significant coronary disease. Minimal contrast used with angiography.  2. Severely elevated right and left heart filling pressures. PCWP  elevated more than LVEDP.  3. Preserved cardiac output.  4. Pulmonary venous hypertension.   I am going to give him 80 mg IV Lasix now. I recommended that he stay in the hospital overnight for diuresis as we gave him a fair amount of fluid pre-cath with renal dysfunction. He is very reluctant to stay. Will reassess after he gets IV Lasix.   If he goes home today, he will resume his prior home meds with addition of amlodipine 5 mg daily. He will hold valsartan  until Saturday (may then restart). He will restart his po Lasix regimen tomorrow (the same regimen we had him on before he starting holding meds for cath).   He has followup arranged.   Procedural Details/Technique   Technical Details Procedure: Right Heart Cath, Left Heart Cath, Selective Coronary Angiography  Indication: Pre-mitral valve repair   Procedural Details: The right groin was prepped, draped, and anesthetized with 1% lidocaine. Using the modified Seldinger technique a 7 French sheath was placed in the right femoral vein. A Swan-Ganz catheter was used for the right heart catheterization. Standard protocol was followed for recording of right heart pressures and sampling of oxygen saturations. Fick and thermodilution cardiac output was calculated. The right radial artery was entered using modified Seldinger technique and a 69F arterial sheath was placed. The patient received 3 mg IA verapamil and weight-based IV heparin. Standard Judkins catheters were used for selective coronary angiography. There were no immediate procedural complications. The patient was transferred to the post catheterization recovery area for further monitoring.\  35-40 cc contrast used.   Estimated blood loss <50 mL.  During this procedure the patient was administered the following to achieve and maintain moderate conscious sedation: Versed 1 mg, Fentanyl 25 mcg, while the patient's heart rate, blood pressure, and oxygen saturation were continuously  monitored. The period of conscious sedation was 51 minutes, of which I was present face-to-face 100% of this time.    Coronary Findings   Dominance: Right  Left Main  No significant disease.  Left Anterior Descending  Mild luminal irregularities.  Left Circumflex  No significant disease.  Right Coronary Artery  No significant disease.  Right Heart   Right Heart Pressures R[facility] Procedural Findings: Hemodynamics (mmHg) RA mean 15 RV 73/18 PA 76/31, mean 48 PCWP mean 45 LV 133/29 AO 141/95  Oxygen saturations: PA 68% AO 100%  Cardiac Output (Fick) 6.44  Cardiac Index (Fick) 3.02  Cardiac Output (thermo) 5.63 Cardiac Index (thermo) 2.64    Wall Motion   Not done, CKD        Implants        No implant documentation for this case.  PACS Images   Show images for Cardiac catheterization   Link to Procedure Log   Procedure Log    Hemo Data    Most Recent Value  Fick Cardiac Output 6.44 L/min  Fick Cardiac Output Index 3.02 (L/min)/BSA  Thermal Cardiac Output 5.63 L/min  Thermal Cardiac Output Index 2.64 (L/min)/BSA  RA A Wave 19 mmHg  RA V Wave 17 mmHg  RA Mean 15 mmHg  RV Systolic Pressure 73 mmHg  RV Diastolic Pressure 7 mmHg  RV EDP 18 mmHg  PA Systolic Pressure 76 mmHg  PA Diastolic Pressure 31 mmHg  PA Mean 48 mmHg  PW A Wave 47 mmHg  PW V Wave 48 mmHg  PW Mean 45 mmHg  AO Systolic Pressure 720 mmHg  AO Diastolic Pressure 947 mmHg  AO Mean 096 mmHg  LV Systolic Pressure 283 mmHg  LV Diastolic Pressure 10 mmHg  LV EDP 29 mmHg  Arterial Occlusion Pressure Extended Systolic Pressure 662 mmHg  Arterial Occlusion Pressure Extended Diastolic Pressure 95 mmHg  Arterial Occlusion Pressure Extended Mean Pressure 116 mmHg  Left Ventricular Apex Extended Systolic Pressure 947 mmHg  Left Ventricular Apex Extended Diastolic Pressure 12 mmHg  Left Ventricular Apex Extended EDP Pressure 32 mmHg  TPVR Index 18.16 HRUI  TSVR Index 46.16 HRUI  PVR  SVR Ratio 0.03  TPVR/TSVR Ratio 0.39  Impression:  Patient has stage D severe symptomatic mitral regurgitation. He originally presented with acute combined systolic and diastolic congestive heart failure with class IV symptoms several months ago in the setting of poorly controlled hypertension and chronic kidney disease. He has done reasonably well on medical therapy and currently remains quite stable with class II symptoms of exertional shortness of breath and mild lower extremity edema. However, despite optimal medical therapy recent transesophageal echocardiogram reveals persistent severe mitral regurgitation in the setting of severe left ventricular systolic dysfunction. I personally reviewed the patient's transthoracic and transesophageal echocardiograms. There is severe global left ventricular systolic dysfunction.  Left and right heart catheterization is notable for the presence of widely patent coronary arteries with no significant coronary artery disease. The patient had severe pulmonary hypertension with preserved cardiac output. Pulmonary capillary wedge pressure was elevated greater than left ventricular end-diastolic pressure. On careful review of the patient's recent transesophageal echocardiogram, functional anatomy of the mitral valve appears potentially mixed. For the most part the leaflets appear reasonably thin and mobile. There is no prolapse. The mitral annulus is dilated. This could be primarily a combination of type I and/or type IIIB dysfunction related to the patient's underlying ventricular disease. However, restriction of the posterior leaflet seems overly pronounced despite reasonable posterior lateral wall contractility, so the possibility of intrinsic valvular pathology seems reasonable, particularly given the fact the patient states that he was told he had a heart murmur many years ago. Overall I agree the patient may have a combination of both primary and secondary  mitral regurgitation. Even if his mitral regurgitation is purely secondary,he still has symptoms of exertional shortness of breath and severe mitral regurgitation despite optimal medical therapy. I agree he would likely benefit from mitral valve repair.     Plan:  The patient was again counseled at length regarding the indications, risks and potential benefits of mitral valve repair.  The rationale for elective surgery has been explained, including a comparison between surgery and continued medical therapy with close follow-up.  The likelihood of successful and durable valve repair has been discussed with particular reference to the findings of their recent echocardiogram.  Based upon these findings and previous experience, I have quoted them a greater than 50 percent likelihood of successful valve repair.  In the unlikely event that their valve cannot be successfully repaired, we discussed the possibility of replacing the mitral valve using a mechanical prosthesis with the attendant need for long-term anticoagulation versus the alternative of replacing it using a bioprosthetic tissue valve with its potential for late structural valve deterioration and failure, depending upon the patient's longevity.  The patient specifically requests that if the mitral valve must be replaced that it be done using a mechanical valve.   The patient understands and accepts all potential risks of surgery including but not limited to risk of death, stroke or other neurologic complication, myocardial infarction, congestive heart failure, respiratory failure, renal failure, bleeding requiring transfusion and/or reexploration, arrhythmia, infection or other wound complications, pneumonia, pleural and/or pericardial effusion, pulmonary embolus, aortic dissection or other major vascular complication, or delayed complications related to valve repair or replacement including but not limited to structural valve deterioration and  failure, thrombosis, embolization, endocarditis, or paravalvular leak.  Issues related to the patient's underlying severe global left ventricular systolic dysfunction and chronic kidney disease were discussed including the small but significant possibility that he might need at least short-term mechanical circulatory support and/or dialysis therapy. The possibility that he may need prolonged  inotropic support have been discussed.  Alternative surgical approaches have been discussed including a comparison between conventional sternotomy and minimally-invasive techniques.  The relative risks and benefits of each have been reviewed as they pertain to the patient's specific circumstances, and all of their questions have been addressed.  Specific risks potentially related to the minimally-invasive approach were discussed at length, including but not limited to risk of conversion to full or partial sternotomy, aortic dissection or other major vascular complication, unilateral acute lung injury or pulmonary edema, phrenic nerve dysfunction or paralysis, rib fracture, chronic pain, lung hernia, or lymphocele.   All of his questions have been answered.  We plan to proceed with surgery on Thursday, 06/29/2016.    [name], MD 06/20/2016 4:57 PM

## 2016-06-28 NOTE — Progress Notes (Signed)
Anesthesia Chart Review:   Pt is a 51 year old male scheduled for minimally invasive mitral valve repair or replacement, TEE on 06/29/2016 Darylene Price, MD.   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 Never smoker. BMI 28. S/p gastric bypass.   Medications include: albuterol, amlodipine, ASA 81mg , carvedilol, iron, lasix, hydralazine, imdur, vyvanse, potassium, valsartan  Preoperative labs reviewed.   - HbA1c 5.1, glucose 95 - Cr 2.24, BUN 32.  This is slightly improved compared with prior results.  - PT normal, PTT 37.  - Dr. Roxy Manns has reviewed lab results  CXR 06/27/16: No acute abnormality noted.  EKG 06/08/16: NSR. Possible LA enlargement. T wave abnormality, consider anterior ischemia  Carotid duplex 06/27/16: No significant extracranial carotid artery stenosis demonstrated. Vertebrals are patent with antegrade flow.   Cardiac cath 06/07/16:  1. No significant coronary disease.  Minimal contrast used with angiography.  2. Severely elevated right and left heart filling pressures.  PCWP elevated more than LVEDP.  3. Preserved cardiac output.  4. Pulmonary venous hypertension.   Cardiac event monitor 05/18/16:   Normal Sinus Rhythm with average heart rate 85bpm. The heart rate ranged from 85 to 102bpm.  Rare PVCs noted  TEE 05/04/16:  Left ventricle: The cavity size was moderately reduced. Wall thickness was normal. Systolic function was severely reduced. The estimated ejection fraction was in the range of 25% to 30%. Diffuse hypokinesis. - Aortic valve: There was no stenosis. There was trivial regurgitation. - Aorta: The aorta was normal in caliber with mild plaque in the descending thoracic aorta. - Mitral valve: The mitral valve was mildly  thickened especially towards the tip of the anterior leaflet. There was inadequate coaptation of the leaflets with severe posterioly-directed mitral regurgitation. ERO 0.52 cm^2. There was systolic flow reversal on the pulmonary vein doppler pattern. Regurgitant volume (PISA): 90 ml. - Left atrium: The atrium was moderately dilated. No evidence of thrombus in the atrial cavity or appendage. - Right ventricle: The cavity size was normal. Systolic function was mildly reduced. - Right atrium: The atrium was mildly dilated. - Atrial septum: No defect or patent foramen ovale was identified. Echo contrast study showed no right-to-left atrial level shunt, at baseline or with provocation. - Tricuspid valve: Mild TR with peak RV-RA gradient 55 mmHg. - Impressions:Severe mitral regurgitation. Suspect there is a component of secondary MR from dilated LV, but there is thickening of the leaflets, possible primary MR component as well.  If no changes, I anticipate pt can proceed with surgery as scheduled.   Willeen Cass, FNP-BC [facility]/Anesthesiology Phone: [phone] 06/28/2016 10:54 AM

## 2016-06-29 ENCOUNTER — Inpatient Hospital Stay (HOSPITAL_COMMUNITY): Payer: Medicaid Other

## 2016-06-29 ENCOUNTER — Encounter (HOSPITAL_COMMUNITY): Payer: Self-pay | Admitting: *Deleted

## 2016-06-29 ENCOUNTER — Inpatient Hospital Stay (HOSPITAL_COMMUNITY): Payer: Medicaid Other | Admitting: Anesthesiology

## 2016-06-29 ENCOUNTER — Inpatient Hospital Stay (HOSPITAL_COMMUNITY)
Admission: RE | Admit: 2016-06-29 | Discharge: 2016-07-04 | DRG: 220 | Disposition: A | Discharge status: Home / Self Care | Payer: Medicaid Other | Source: Ambulatory Visit | Attending: Thoracic Surgery (Cardiothoracic Vascular Surgery) | Admitting: Thoracic Surgery (Cardiothoracic Vascular Surgery)

## 2016-06-29 ENCOUNTER — Encounter (HOSPITAL_COMMUNITY)
Admission: RE | Disposition: A | Discharge status: Home / Self Care | Payer: Self-pay | Source: Ambulatory Visit | Attending: Thoracic Surgery (Cardiothoracic Vascular Surgery)

## 2016-06-29 DIAGNOSIS — I340 Nonrheumatic mitral (valve) insufficiency: Secondary | ICD-10-CM | POA: Diagnosis present

## 2016-06-29 DIAGNOSIS — I440 Atrioventricular block, first degree: Secondary | ICD-10-CM | POA: Diagnosis present

## 2016-06-29 DIAGNOSIS — I429 Cardiomyopathy, unspecified: Secondary | ICD-10-CM

## 2016-06-29 DIAGNOSIS — I4581 Long QT syndrome: Secondary | ICD-10-CM | POA: Diagnosis present

## 2016-06-29 DIAGNOSIS — D62 Acute posthemorrhagic anemia: Secondary | ICD-10-CM | POA: Diagnosis not present

## 2016-06-29 DIAGNOSIS — I10 Essential (primary) hypertension: Secondary | ICD-10-CM

## 2016-06-29 DIAGNOSIS — Z87442 Personal history of urinary calculi: Secondary | ICD-10-CM | POA: Diagnosis not present

## 2016-06-29 DIAGNOSIS — Z79899 Other long term (current) drug therapy: Secondary | ICD-10-CM

## 2016-06-29 DIAGNOSIS — G4733 Obstructive sleep apnea (adult) (pediatric): Secondary | ICD-10-CM | POA: Diagnosis present

## 2016-06-29 DIAGNOSIS — Z833 Family history of diabetes mellitus: Secondary | ICD-10-CM

## 2016-06-29 DIAGNOSIS — K5900 Constipation, unspecified: Secondary | ICD-10-CM

## 2016-06-29 DIAGNOSIS — I420 Dilated cardiomyopathy: Secondary | ICD-10-CM | POA: Diagnosis present

## 2016-06-29 DIAGNOSIS — N184 Chronic kidney disease, stage 4 (severe): Secondary | ICD-10-CM | POA: Diagnosis present

## 2016-06-29 DIAGNOSIS — Z9889 Other specified postprocedural states: Secondary | ICD-10-CM

## 2016-06-29 DIAGNOSIS — Z8249 Family history of ischemic heart disease and other diseases of the circulatory system: Secondary | ICD-10-CM | POA: Diagnosis not present

## 2016-06-29 DIAGNOSIS — I472 Ventricular tachycardia: Secondary | ICD-10-CM | POA: Diagnosis present

## 2016-06-29 DIAGNOSIS — Z09 Encounter for follow-up examination after completed treatment for conditions other than malignant neoplasm: Secondary | ICD-10-CM

## 2016-06-29 DIAGNOSIS — I081 Rheumatic disorders of both mitral and tricuspid valves: Principal | ICD-10-CM | POA: Diagnosis present

## 2016-06-29 DIAGNOSIS — I459 Conduction disorder, unspecified: Secondary | ICD-10-CM | POA: Diagnosis present

## 2016-06-29 DIAGNOSIS — I272 Pulmonary hypertension, unspecified: Secondary | ICD-10-CM | POA: Diagnosis present

## 2016-06-29 DIAGNOSIS — I509 Heart failure, unspecified: Secondary | ICD-10-CM

## 2016-06-29 DIAGNOSIS — I5022 Chronic systolic (congestive) heart failure: Secondary | ICD-10-CM | POA: Diagnosis present

## 2016-06-29 DIAGNOSIS — Z7982 Long term (current) use of aspirin: Secondary | ICD-10-CM | POA: Diagnosis not present

## 2016-06-29 DIAGNOSIS — J9811 Atelectasis: Secondary | ICD-10-CM

## 2016-06-29 DIAGNOSIS — J45909 Unspecified asthma, uncomplicated: Secondary | ICD-10-CM | POA: Diagnosis present

## 2016-06-29 DIAGNOSIS — D638 Anemia in other chronic diseases classified elsewhere: Secondary | ICD-10-CM | POA: Diagnosis present

## 2016-06-29 DIAGNOSIS — Z9884 Bariatric surgery status: Secondary | ICD-10-CM

## 2016-06-29 DIAGNOSIS — I130 Hypertensive heart and chronic kidney disease with heart failure and stage 1 through stage 4 chronic kidney disease, or unspecified chronic kidney disease: Secondary | ICD-10-CM | POA: Diagnosis present

## 2016-06-29 DIAGNOSIS — M109 Gout, unspecified: Secondary | ICD-10-CM | POA: Diagnosis present

## 2016-06-29 DIAGNOSIS — M1990 Unspecified osteoarthritis, unspecified site: Secondary | ICD-10-CM | POA: Diagnosis present

## 2016-06-29 DIAGNOSIS — R0602 Shortness of breath: Secondary | ICD-10-CM | POA: Diagnosis present

## 2016-06-29 DIAGNOSIS — I5042 Chronic combined systolic (congestive) and diastolic (congestive) heart failure: Secondary | ICD-10-CM | POA: Diagnosis present

## 2016-06-29 DIAGNOSIS — D649 Anemia, unspecified: Secondary | ICD-10-CM | POA: Diagnosis present

## 2016-06-29 HISTORY — DX: Other specified postprocedural states: Z98.890

## 2016-06-29 HISTORY — PX: MITRAL VALVE REPAIR: SHX2039

## 2016-06-29 HISTORY — PX: TEE WITHOUT CARDIOVERSION: SHX5443

## 2016-06-29 LAB — POCT I-STAT 4, (NA,K, GLUC, HGB,HCT)
Glucose, Bld: 94 mg/dL (ref 65–99)
HCT: 27 % — ABNORMAL LOW (ref 39.0–52.0)
Hemoglobin: 9.2 g/dL — ABNORMAL LOW (ref 13.0–17.0)
Potassium: 3.6 mmol/L (ref 3.5–5.1)
Sodium: 145 mmol/L (ref 135–145)

## 2016-06-29 LAB — POCT I-STAT 3, ART BLOOD GAS (G3+)
Acid-Base Excess: 6 mmol/L — ABNORMAL HIGH (ref 0.0–2.0)
Acid-Base Excess: 8 mmol/L — ABNORMAL HIGH (ref 0.0–2.0)
Acid-Base Excess: 5 mmol/L — ABNORMAL HIGH (ref 0.0–2.0)
Acid-Base Excess: 6 mmol/L — ABNORMAL HIGH (ref 0.0–2.0)
Acid-Base Excess: 1 mmol/L (ref 0.0–2.0)
Acid-Base Excess: 2 mmol/L (ref 0.0–2.0)
Bicarbonate: 33 mmol/L — ABNORMAL HIGH (ref 20.0–28.0)
Bicarbonate: 31.7 mmol/L — ABNORMAL HIGH (ref 20.0–28.0)
Bicarbonate: 29.7 mmol/L — ABNORMAL HIGH (ref 20.0–28.0)
Bicarbonate: 30.4 mmol/L — ABNORMAL HIGH (ref 20.0–28.0)
Bicarbonate: 26.2 mmol/L (ref 20.0–28.0)
Bicarbonate: 27.9 mmol/L (ref 20.0–28.0)
O2 Saturation: 100 %
O2 Saturation: 100 %
O2 Saturation: 94 %
O2 Saturation: 98 %
O2 Saturation: 99 %
O2 Saturation: 99 %
Patient temperature: 36
Patient temperature: 36.6
Patient temperature: 36.6
TCO2: 35 mmol/L (ref 0–100)
TCO2: 33 mmol/L (ref 0–100)
TCO2: 31 mmol/L (ref 0–100)
TCO2: 32 mmol/L (ref 0–100)
TCO2: 28 mmol/L (ref 0–100)
TCO2: 29 mmol/L (ref 0–100)
pCO2 arterial: 59.3 mmHg — ABNORMAL HIGH (ref 32.0–48.0)
pCO2 arterial: 40.1 mmHg (ref 32.0–48.0)
pCO2 arterial: 43.7 mmHg (ref 32.0–48.0)
pCO2 arterial: 41.1 mmHg (ref 32.0–48.0)
pCO2 arterial: 44.7 mmHg (ref 32.0–48.0)
pCO2 arterial: 45.7 mmHg (ref 32.0–48.0)
pH, Arterial: 7.354 (ref 7.350–7.450)
pH, Arterial: 7.506 — ABNORMAL HIGH (ref 7.350–7.450)
pH, Arterial: 7.441 (ref 7.350–7.450)
pH, Arterial: 7.472 — ABNORMAL HIGH (ref 7.350–7.450)
pH, Arterial: 7.375 (ref 7.350–7.450)
pH, Arterial: 7.392 (ref 7.350–7.450)
pO2, Arterial: 402 mmHg — ABNORMAL HIGH (ref 83.0–108.0)
pO2, Arterial: 419 mmHg — ABNORMAL HIGH (ref 83.0–108.0)
pO2, Arterial: 71 mmHg — ABNORMAL LOW (ref 83.0–108.0)
pO2, Arterial: 91 mmHg (ref 83.0–108.0)
pO2, Arterial: 154 mmHg — ABNORMAL HIGH (ref 83.0–108.0)
pO2, Arterial: 149 mmHg — ABNORMAL HIGH (ref 83.0–108.0)

## 2016-06-29 LAB — CBC
HCT: 27.9 % — ABNORMAL LOW (ref 39.0–52.0)
HCT: 27.2 % — ABNORMAL LOW (ref 39.0–52.0)
Hemoglobin: 8.6 g/dL — ABNORMAL LOW (ref 13.0–17.0)
Hemoglobin: 8.6 g/dL — ABNORMAL LOW (ref 13.0–17.0)
MCH: 25.2 pg — ABNORMAL LOW (ref 26.0–34.0)
MCH: 25.6 pg — ABNORMAL LOW (ref 26.0–34.0)
MCHC: 30.8 g/dL (ref 30.0–36.0)
MCHC: 31.6 g/dL (ref 30.0–36.0)
MCV: 81.8 fL (ref 78.0–100.0)
MCV: 81 fL (ref 78.0–100.0)
Platelets: 159 10*3/uL (ref 150–400)
Platelets: 154 10*3/uL (ref 150–400)
RBC: 3.41 MIL/uL — ABNORMAL LOW (ref 4.22–5.81)
RBC: 3.36 MIL/uL — ABNORMAL LOW (ref 4.22–5.81)
RDW: 19.1 % — ABNORMAL HIGH (ref 11.5–15.5)
RDW: 18.7 % — ABNORMAL HIGH (ref 11.5–15.5)
WBC: 8.1 10*3/uL (ref 4.0–10.5)
WBC: 10.3 10*3/uL (ref 4.0–10.5)

## 2016-06-29 LAB — POCT I-STAT, CHEM 8
BUN: 37 mg/dL — ABNORMAL HIGH (ref 6–20)
BUN: 32 mg/dL — ABNORMAL HIGH (ref 6–20)
BUN: 35 mg/dL — ABNORMAL HIGH (ref 6–20)
BUN: 34 mg/dL — ABNORMAL HIGH (ref 6–20)
BUN: 35 mg/dL — ABNORMAL HIGH (ref 6–20)
BUN: 34 mg/dL — ABNORMAL HIGH (ref 6–20)
Calcium, Ion: 1.19 mmol/L (ref 1.15–1.40)
Calcium, Ion: 1.12 mmol/L — ABNORMAL LOW (ref 1.15–1.40)
Calcium, Ion: 1.19 mmol/L (ref 1.15–1.40)
Calcium, Ion: 1.11 mmol/L — ABNORMAL LOW (ref 1.15–1.40)
Calcium, Ion: 1.15 mmol/L (ref 1.15–1.40)
Calcium, Ion: 1.18 mmol/L (ref 1.15–1.40)
Chloride: 100 mmol/L — ABNORMAL LOW (ref 101–111)
Chloride: 100 mmol/L — ABNORMAL LOW (ref 101–111)
Chloride: 98 mmol/L — ABNORMAL LOW (ref 101–111)
Chloride: 101 mmol/L (ref 101–111)
Chloride: 101 mmol/L (ref 101–111)
Chloride: 104 mmol/L (ref 101–111)
Creatinine, Ser: 2.4 mg/dL — ABNORMAL HIGH (ref 0.61–1.24)
Creatinine, Ser: 2.1 mg/dL — ABNORMAL HIGH (ref 0.61–1.24)
Creatinine, Ser: 2.4 mg/dL — ABNORMAL HIGH (ref 0.61–1.24)
Creatinine, Ser: 2.3 mg/dL — ABNORMAL HIGH (ref 0.61–1.24)
Creatinine, Ser: 2.3 mg/dL — ABNORMAL HIGH (ref 0.61–1.24)
Creatinine, Ser: 2.2 mg/dL — ABNORMAL HIGH (ref 0.61–1.24)
Glucose, Bld: 88 mg/dL (ref 65–99)
Glucose, Bld: 106 mg/dL — ABNORMAL HIGH (ref 65–99)
Glucose, Bld: 98 mg/dL (ref 65–99)
Glucose, Bld: 149 mg/dL — ABNORMAL HIGH (ref 65–99)
Glucose, Bld: 146 mg/dL — ABNORMAL HIGH (ref 65–99)
Glucose, Bld: 108 mg/dL — ABNORMAL HIGH (ref 65–99)
HCT: 27 % — ABNORMAL LOW (ref 39.0–52.0)
HCT: 23 % — ABNORMAL LOW (ref 39.0–52.0)
HCT: 25 % — ABNORMAL LOW (ref 39.0–52.0)
HCT: 20 % — ABNORMAL LOW (ref 39.0–52.0)
HCT: 22 % — ABNORMAL LOW (ref 39.0–52.0)
HCT: 26 % — ABNORMAL LOW (ref 39.0–52.0)
Hemoglobin: 9.2 g/dL — ABNORMAL LOW (ref 13.0–17.0)
Hemoglobin: 7.8 g/dL — ABNORMAL LOW (ref 13.0–17.0)
Hemoglobin: 8.5 g/dL — ABNORMAL LOW (ref 13.0–17.0)
Hemoglobin: 6.8 g/dL — CL (ref 13.0–17.0)
Hemoglobin: 7.5 g/dL — ABNORMAL LOW (ref 13.0–17.0)
Hemoglobin: 8.8 g/dL — ABNORMAL LOW (ref 13.0–17.0)
Potassium: 3.3 mmol/L — ABNORMAL LOW (ref 3.5–5.1)
Potassium: 3.3 mmol/L — ABNORMAL LOW (ref 3.5–5.1)
Potassium: 3.3 mmol/L — ABNORMAL LOW (ref 3.5–5.1)
Potassium: 3.9 mmol/L (ref 3.5–5.1)
Potassium: 3.4 mmol/L — ABNORMAL LOW (ref 3.5–5.1)
Potassium: 3.9 mmol/L (ref 3.5–5.1)
Sodium: 143 mmol/L (ref 135–145)
Sodium: 140 mmol/L (ref 135–145)
Sodium: 142 mmol/L (ref 135–145)
Sodium: 141 mmol/L (ref 135–145)
Sodium: 143 mmol/L (ref 135–145)
Sodium: 142 mmol/L (ref 135–145)
TCO2: 32 mmol/L (ref 0–100)
TCO2: 29 mmol/L (ref 0–100)
TCO2: 34 mmol/L (ref 0–100)
TCO2: 30 mmol/L (ref 0–100)
TCO2: 27 mmol/L (ref 0–100)
TCO2: 27 mmol/L (ref 0–100)

## 2016-06-29 LAB — GLUCOSE, CAPILLARY
Glucose-Capillary: 107 mg/dL — ABNORMAL HIGH (ref 65–99)
Glucose-Capillary: 110 mg/dL — ABNORMAL HIGH (ref 65–99)
Glucose-Capillary: 87 mg/dL (ref 65–99)
Glucose-Capillary: 92 mg/dL (ref 65–99)
Glucose-Capillary: 96 mg/dL (ref 65–99)

## 2016-06-29 LAB — HEMOGLOBIN AND HEMATOCRIT, BLOOD
HCT: 20 % — ABNORMAL LOW (ref 39.0–52.0)
Hemoglobin: 6.3 g/dL — CL (ref 13.0–17.0)

## 2016-06-29 LAB — PROTIME-INR
INR: 1.44
Prothrombin Time: 17.7 seconds — ABNORMAL HIGH (ref 11.4–15.2)

## 2016-06-29 LAB — PLATELET COUNT: Platelets: 169 10*3/uL (ref 150–400)

## 2016-06-29 LAB — APTT: aPTT: 36 seconds (ref 24–36)

## 2016-06-29 LAB — PREPARE RBC (CROSSMATCH)

## 2016-06-29 LAB — CREATININE, SERUM
Creatinine, Ser: 2.35 mg/dL — ABNORMAL HIGH (ref 0.61–1.24)
GFR calc Af Amer: 36 mL/min — ABNORMAL LOW (ref >60)
GFR calc non Af Amer: 31 mL/min — ABNORMAL LOW (ref >60)

## 2016-06-29 LAB — MAGNESIUM: Magnesium: 2.9 mg/dL — ABNORMAL HIGH (ref 1.7–2.4)

## 2016-06-29 SURGERY — REPAIR, MITRAL VALVE, MINIMALLY INVASIVE
Anesthesia: General | Site: Chest | Laterality: Right

## 2016-06-29 MED ORDER — FAMOTIDINE IN NACL 20-0.9 MG/50ML-% IV SOLN
20.0000 mg | Freq: Two times a day (BID) | INTRAVENOUS | Status: DC
  Administered 2016-06-29: 20 mg via INTRAVENOUS

## 2016-06-29 MED ORDER — POTASSIUM CHLORIDE 10 MEQ/50ML IV SOLN
10.0000 meq | INTRAVENOUS | Status: AC
  Administered 2016-06-29 (×3): 10 meq via INTRAVENOUS

## 2016-06-29 MED ORDER — HEPARIN SODIUM (PORCINE) 1000 UNIT/ML IJ SOLN
INTRAMUSCULAR | Status: AC
  Filled 2016-06-29: qty 1

## 2016-06-29 MED ORDER — TRAMADOL HCL 50 MG PO TABS
50.0000 mg | ORAL_TABLET | ORAL | Status: DC | PRN
  Administered 2016-06-30 (×2): 50 mg via ORAL
  Administered 2016-07-01: 100 mg via ORAL
  Administered 2016-07-02: 50 mg via ORAL
  Filled 2016-06-29: qty 1
  Filled 2016-06-29: qty 2
  Filled 2016-06-29 (×2): qty 1

## 2016-06-29 MED ORDER — MIDAZOLAM HCL 2 MG/2ML IJ SOLN
INTRAMUSCULAR | Status: AC
  Filled 2016-06-29: qty 2

## 2016-06-29 MED ORDER — PROTAMINE SULFATE 10 MG/ML IV SOLN
INTRAVENOUS | Status: AC
  Filled 2016-06-29: qty 10

## 2016-06-29 MED ORDER — ACETAMINOPHEN 650 MG RE SUPP
650.0000 mg | Freq: Once | RECTAL | Status: AC
  Administered 2016-06-29: 650 mg via RECTAL

## 2016-06-29 MED ORDER — ARTIFICIAL TEARS OPHTHALMIC OINT
TOPICAL_OINTMENT | OPHTHALMIC | Status: AC
  Filled 2016-06-29: qty 3.5

## 2016-06-29 MED ORDER — SODIUM CHLORIDE 0.9 % IV SOLN
0.0000 ug/min | INTRAVENOUS | Status: DC
  Filled 2016-06-29: qty 2

## 2016-06-29 MED ORDER — SODIUM CHLORIDE 0.9 % IV SOLN
INTRAVENOUS | Status: DC
  Filled 2016-06-29: qty 1

## 2016-06-29 MED ORDER — INSULIN REGULAR BOLUS VIA INFUSION
0.0000 [IU] | Freq: Three times a day (TID) | INTRAVENOUS | Status: DC
  Filled 2016-06-29: qty 10

## 2016-06-29 MED ORDER — PHENYLEPHRINE 40 MCG/ML (10ML) SYRINGE FOR IV PUSH (FOR BLOOD PRESSURE SUPPORT)
PREFILLED_SYRINGE | INTRAVENOUS | Status: AC
  Filled 2016-06-29: qty 10

## 2016-06-29 MED ORDER — ROCURONIUM BROMIDE 10 MG/ML (PF) SYRINGE
PREFILLED_SYRINGE | INTRAVENOUS | Status: AC
  Filled 2016-06-29: qty 5

## 2016-06-29 MED ORDER — FENTANYL CITRATE (PF) 100 MCG/2ML IJ SOLN
INTRAMUSCULAR | Status: DC | PRN
Start: 2016-06-29 — End: 2016-06-29
  Administered 2016-06-29: 100 ug via INTRAVENOUS
  Administered 2016-06-29: 50 ug via INTRAVENOUS
  Administered 2016-06-29: 150 ug via INTRAVENOUS
  Administered 2016-06-29: 50 ug via INTRAVENOUS
  Administered 2016-06-29: 500 ug via INTRAVENOUS
  Administered 2016-06-29: 150 ug via INTRAVENOUS

## 2016-06-29 MED ORDER — PROPOFOL 10 MG/ML IV BOLUS
INTRAVENOUS | Status: DC | PRN
  Administered 2016-06-29: 50 mg via INTRAVENOUS

## 2016-06-29 MED ORDER — MIDAZOLAM HCL 10 MG/2ML IJ SOLN
INTRAMUSCULAR | Status: AC
  Filled 2016-06-29: qty 2

## 2016-06-29 MED ORDER — PROTAMINE SULFATE 10 MG/ML IV SOLN
INTRAVENOUS | Status: AC
  Filled 2016-06-29: qty 25

## 2016-06-29 MED ORDER — CHLORHEXIDINE GLUCONATE 0.12 % MT SOLN
15.0000 mL | OROMUCOSAL | Status: AC
  Administered 2016-06-29: 15 mL via OROMUCOSAL

## 2016-06-29 MED ORDER — SODIUM CHLORIDE 0.9 % IV SOLN: INTRAVENOUS | Status: DC

## 2016-06-29 MED ORDER — LACTATED RINGERS IV SOLN: INTRAVENOUS | Status: DC

## 2016-06-29 MED ORDER — ACETAMINOPHEN 500 MG PO TABS
1000.0000 mg | ORAL_TABLET | Freq: Four times a day (QID) | ORAL | Status: DC
  Administered 2016-06-29 – 2016-07-02 (×11): 1000 mg via ORAL
  Filled 2016-06-29 (×11): qty 2

## 2016-06-29 MED ORDER — SUCCINYLCHOLINE CHLORIDE 200 MG/10ML IV SOSY
PREFILLED_SYRINGE | INTRAVENOUS | Status: AC
  Filled 2016-06-29: qty 10

## 2016-06-29 MED ORDER — MIDAZOLAM HCL 5 MG/5ML IJ SOLN
INTRAMUSCULAR | Status: DC | PRN
  Administered 2016-06-29: 1 mg via INTRAVENOUS
  Administered 2016-06-29 (×3): 2 mg via INTRAVENOUS
  Administered 2016-06-29: 1 mg via INTRAVENOUS
  Administered 2016-06-29: 3 mg via INTRAVENOUS
  Administered 2016-06-29: 1 mg via INTRAVENOUS
  Administered 2016-06-29 (×2): 2 mg via INTRAVENOUS

## 2016-06-29 MED ORDER — METOPROLOL TARTRATE 25 MG/10 ML ORAL SUSPENSION: 12.5000 mg | Freq: Two times a day (BID) | ORAL | Status: DC

## 2016-06-29 MED ORDER — ACETAMINOPHEN 160 MG/5ML PO SOLN: 1000.0000 mg | Freq: Four times a day (QID) | ORAL | Status: DC

## 2016-06-29 MED ORDER — SODIUM CHLORIDE 0.9 % IV SOLN: 250.0000 mL | INTRAVENOUS | Status: DC

## 2016-06-29 MED ORDER — MORPHINE SULFATE (PF) 2 MG/ML IV SOLN: 1.0000 mg | INTRAVENOUS | Status: DC | PRN

## 2016-06-29 MED ORDER — SODIUM CHLORIDE 0.9 % IV SOLN: Freq: Once | INTRAVENOUS | Status: DC

## 2016-06-29 MED ORDER — MAGNESIUM SULFATE 4 GM/100ML IV SOLN
4.0000 g | Freq: Once | INTRAVENOUS | Status: AC
  Administered 2016-06-29: 4 g via INTRAVENOUS
  Filled 2016-06-29: qty 100

## 2016-06-29 MED ORDER — ACETAMINOPHEN 160 MG/5ML PO SOLN: 650.0000 mg | Freq: Once | ORAL | Status: AC

## 2016-06-29 MED ORDER — MORPHINE SULFATE (PF) 4 MG/ML IV SOLN
1.0000 mg | INTRAVENOUS | Status: DC | PRN
  Administered 2016-06-29 – 2016-06-30 (×2): 2 mg via INTRAVENOUS
  Filled 2016-06-29 (×2): qty 1

## 2016-06-29 MED ORDER — METOPROLOL TARTRATE 12.5 MG HALF TABLET: 12.5000 mg | ORAL_TABLET | Freq: Two times a day (BID) | ORAL | Status: DC

## 2016-06-29 MED ORDER — PROPOFOL 10 MG/ML IV BOLUS
INTRAVENOUS | Status: AC
  Filled 2016-06-29: qty 20

## 2016-06-29 MED ORDER — LIDOCAINE 2% (20 MG/ML) 5 ML SYRINGE
INTRAMUSCULAR | Status: AC
  Filled 2016-06-29: qty 5

## 2016-06-29 MED ORDER — ARTIFICIAL TEARS OPHTHALMIC OINT
TOPICAL_OINTMENT | OPHTHALMIC | Status: DC | PRN
  Administered 2016-06-29: 1 via OPHTHALMIC

## 2016-06-29 MED ORDER — SODIUM CHLORIDE 0.9% FLUSH
3.0000 mL | Freq: Two times a day (BID) | INTRAVENOUS | Status: DC
  Administered 2016-06-30 – 2016-07-02 (×2): 3 mL via INTRAVENOUS

## 2016-06-29 MED ORDER — BUPIVACAINE HCL (PF) 0.5 % IJ SOLN
INTRAMUSCULAR | Status: AC
  Filled 2016-06-29: qty 30

## 2016-06-29 MED ORDER — DEXTROSE 5 % IV SOLN
1.5000 g | Freq: Two times a day (BID) | INTRAVENOUS | Status: AC
  Administered 2016-06-29 – 2016-07-01 (×4): 1.5 g via INTRAVENOUS
  Filled 2016-06-29 (×4): qty 1.5

## 2016-06-29 MED ORDER — INSULIN ASPART 100 UNIT/ML ~~LOC~~ SOLN
0.0000 [IU] | SUBCUTANEOUS | Status: DC
Start: 2016-06-29 — End: 2016-07-02
  Administered 2016-06-30 – 2016-07-01 (×3): 2 [IU] via SUBCUTANEOUS

## 2016-06-29 MED ORDER — SODIUM CHLORIDE 0.45 % IV SOLN
INTRAVENOUS | Status: DC | PRN
Start: 2016-06-29 — End: 2016-06-30
  Administered 2016-06-29: 14:00:00 via INTRAVENOUS

## 2016-06-29 MED ORDER — PROTAMINE SULFATE 10 MG/ML IV SOLN
INTRAVENOUS | Status: DC | PRN
  Administered 2016-06-29: 70 mg via INTRAVENOUS
  Administered 2016-06-29: 30 mg via INTRAVENOUS
  Administered 2016-06-29: 50 mg via INTRAVENOUS
  Administered 2016-06-29 (×2): 30 mg via INTRAVENOUS
  Administered 2016-06-29 (×2): 50 mg via INTRAVENOUS
  Administered 2016-06-29: 30 mg via INTRAVENOUS

## 2016-06-29 MED ORDER — CHLORHEXIDINE GLUCONATE 4 % EX LIQD
30.0000 mL | CUTANEOUS | Status: DC
Start: 2016-06-29 — End: 2016-06-29

## 2016-06-29 MED ORDER — SODIUM CHLORIDE 0.9 % IV SOLN
INTRAVENOUS | Status: DC | PRN
  Administered 2016-06-29: 12:00:00 via INTRAVENOUS

## 2016-06-29 MED ORDER — ASPIRIN EC 325 MG PO TBEC: 325.0000 mg | DELAYED_RELEASE_TABLET | Freq: Every day | ORAL | Status: DC

## 2016-06-29 MED ORDER — EPHEDRINE 5 MG/ML INJ
INTRAVENOUS | Status: AC
  Filled 2016-06-29: qty 10

## 2016-06-29 MED ORDER — VANCOMYCIN HCL IN DEXTROSE 1-5 GM/200ML-% IV SOLN
1000.0000 mg | Freq: Once | INTRAVENOUS | Status: AC
  Administered 2016-06-29: 1000 mg via INTRAVENOUS
  Filled 2016-06-29: qty 200

## 2016-06-29 MED ORDER — ROCURONIUM BROMIDE 10 MG/ML (PF) SYRINGE
PREFILLED_SYRINGE | INTRAVENOUS | Status: DC | PRN
  Administered 2016-06-29: 30 mg via INTRAVENOUS
  Administered 2016-06-29 (×2): 50 mg via INTRAVENOUS
  Administered 2016-06-29: 70 mg via INTRAVENOUS

## 2016-06-29 MED ORDER — NITROGLYCERIN IN D5W 200-5 MCG/ML-% IV SOLN: 0.0000 ug/min | INTRAVENOUS | Status: DC

## 2016-06-29 MED ORDER — HEPARIN SODIUM (PORCINE) 1000 UNIT/ML IJ SOLN
INTRAMUSCULAR | Status: AC
Start: 2016-06-29 — End: 2016-06-29
  Filled 2016-06-29: qty 1

## 2016-06-29 MED ORDER — LACTATED RINGERS IV SOLN
INTRAVENOUS | Status: DC | PRN
  Administered 2016-06-29: 07:00:00 via INTRAVENOUS

## 2016-06-29 MED ORDER — ASPIRIN 81 MG PO CHEW: 324.0000 mg | CHEWABLE_TABLET | Freq: Every day | ORAL | Status: DC

## 2016-06-29 MED ORDER — SODIUM CHLORIDE 0.9% FLUSH: 3.0000 mL | INTRAVENOUS | Status: DC | PRN

## 2016-06-29 MED ORDER — DEXMEDETOMIDINE HCL 200 MCG/2ML IV SOLN
0.0000 ug/kg/h | INTRAVENOUS | Status: DC
  Filled 2016-06-29: qty 2

## 2016-06-29 MED ORDER — BUPIVACAINE HCL (PF) 0.5 % IJ SOLN
INTRAMUSCULAR | Status: DC | PRN
  Administered 2016-06-29: 30 mL

## 2016-06-29 MED ORDER — 0.9 % SODIUM CHLORIDE (POUR BTL) OPTIME
TOPICAL | Status: DC | PRN
  Administered 2016-06-29: 5000 mL

## 2016-06-29 MED ORDER — DOCUSATE SODIUM 100 MG PO CAPS
200.0000 mg | ORAL_CAPSULE | Freq: Every day | ORAL | Status: DC
  Administered 2016-06-30 – 2016-07-02 (×3): 200 mg via ORAL
  Filled 2016-06-29 (×3): qty 2

## 2016-06-29 MED ORDER — LACTATED RINGERS IV SOLN
INTRAVENOUS | Status: DC | PRN
  Administered 2016-06-29 (×2): via INTRAVENOUS

## 2016-06-29 MED ORDER — ONDANSETRON HCL 4 MG/2ML IJ SOLN
4.0000 mg | Freq: Four times a day (QID) | INTRAMUSCULAR | Status: DC | PRN
  Administered 2016-06-30: 4 mg via INTRAVENOUS
  Filled 2016-06-29: qty 2

## 2016-06-29 MED ORDER — OXYCODONE HCL 5 MG PO TABS
5.0000 mg | ORAL_TABLET | ORAL | Status: DC | PRN
  Administered 2016-06-30 – 2016-07-01 (×2): 5 mg via ORAL
  Filled 2016-06-29 (×2): qty 1

## 2016-06-29 MED ORDER — BUPIVACAINE 0.5 % ON-Q PUMP SINGLE CATH 400 ML
400.0000 mL | INJECTION | Status: DC
  Filled 2016-06-29: qty 400

## 2016-06-29 MED ORDER — ALBUMIN HUMAN 5 % IV SOLN: 250.0000 mL | INTRAVENOUS | Status: AC | PRN

## 2016-06-29 MED ORDER — MORPHINE SULFATE (PF) 4 MG/ML IV SOLN: 1.0000 mg | INTRAVENOUS | Status: DC | PRN

## 2016-06-29 MED ORDER — FENTANYL CITRATE (PF) 250 MCG/5ML IJ SOLN
INTRAMUSCULAR | Status: AC
  Filled 2016-06-29: qty 25

## 2016-06-29 MED ORDER — METOPROLOL TARTRATE 5 MG/5ML IV SOLN
2.5000 mg | INTRAVENOUS | Status: DC | PRN
  Filled 2016-06-29: qty 5

## 2016-06-29 MED ORDER — BISACODYL 10 MG RE SUPP: 10.0000 mg | Freq: Every day | RECTAL | Status: DC

## 2016-06-29 MED ORDER — BISACODYL 5 MG PO TBEC
10.0000 mg | DELAYED_RELEASE_TABLET | Freq: Every day | ORAL | Status: DC
  Administered 2016-06-30 – 2016-07-02 (×3): 10 mg via ORAL
  Filled 2016-06-29 (×3): qty 2

## 2016-06-29 MED ORDER — HEPARIN SODIUM (PORCINE) 1000 UNIT/ML IJ SOLN
INTRAMUSCULAR | Status: DC | PRN
  Administered 2016-06-29: 34000 [IU] via INTRAVENOUS

## 2016-06-29 MED ORDER — SODIUM CHLORIDE 0.9 % IV SOLN
INTRAVENOUS | Status: AC
  Administered 2016-06-29: 14:00:00 via INTRAVENOUS

## 2016-06-29 MED ORDER — MIDAZOLAM HCL 2 MG/2ML IJ SOLN: 2.0000 mg | INTRAMUSCULAR | Status: DC | PRN

## 2016-06-29 MED ORDER — BUPIVACAINE 0.5 % ON-Q PUMP SINGLE CATH 400 ML
INJECTION | Status: AC | PRN
  Administered 2016-06-29: 400 mL

## 2016-06-29 MED ORDER — PANTOPRAZOLE SODIUM 40 MG PO TBEC
40.0000 mg | DELAYED_RELEASE_TABLET | Freq: Every day | ORAL | Status: DC
  Administered 2016-07-01 – 2016-07-02 (×2): 40 mg via ORAL
  Filled 2016-06-29 (×2): qty 1

## 2016-06-29 MED ORDER — LACTATED RINGERS IV SOLN: 500.0000 mL | Freq: Once | INTRAVENOUS | Status: DC | PRN

## 2016-06-29 MED FILL — Potassium Chloride Inj 2 mEq/ML: INTRAVENOUS | Qty: 10 | Status: AC

## 2016-06-29 MED FILL — Heparin Sodium (Porcine) Inj 1000 Unit/ML: INTRAMUSCULAR | Qty: 30 | Status: AC

## 2016-06-29 MED FILL — Magnesium Sulfate Inj 50%: INTRAMUSCULAR | Qty: 10 | Status: AC

## 2016-06-29 SURGICAL SUPPLY — 97 items
ADAPTER CARDIO PERF ANTE/RETRO (ADAPTER) ×3 IMPLANT
BAG DECANTER FOR FLEXI CONT (MISCELLANEOUS) ×6 IMPLANT
BLADE SURG 11 STRL SS (BLADE) ×3 IMPLANT
CANISTER SUCT 3000ML PPV (MISCELLANEOUS) ×6 IMPLANT
CANNULA FEM VENOUS REMOTE 22FR (CANNULA) ×3 IMPLANT
CANNULA FEMORAL ART 14 SM (MISCELLANEOUS) ×3 IMPLANT
CANNULA GUNDRY RCSP 15FR (MISCELLANEOUS) ×3 IMPLANT
CANNULA OPTISITE PERFUSION 16F (CANNULA) IMPLANT
CANNULA OPTISITE PERFUSION 18F (CANNULA) ×3 IMPLANT
CANNULA SUMP PERICARDIAL (CANNULA) ×6 IMPLANT
CATH KIT ON Q 5IN SLV (PAIN MANAGEMENT) IMPLANT
CATH KIT ON-Q SILVERSOAK 5IN (CATHETERS) ×3 IMPLANT
CONN ST 1/4X3/8  BEN (MISCELLANEOUS) ×2
CONN ST 1/4X3/8 BEN (MISCELLANEOUS) ×4 IMPLANT
CONNECTOR 1/2X3/8X1/2 3 WAY (MISCELLANEOUS) ×1
CONNECTOR 1/2X3/8X1/2 3WAY (MISCELLANEOUS) ×2 IMPLANT
CONT SPEC 4OZ CLIKSEAL STRL BL (MISCELLANEOUS) ×3 IMPLANT
COVER BACK TABLE 24X17X13 BIG (DRAPES) ×3 IMPLANT
CRADLE DONUT ADULT HEAD (MISCELLANEOUS) ×3 IMPLANT
DERMABOND ADVANCED (GAUZE/BANDAGES/DRESSINGS) ×2
DERMABOND ADVANCED .7 DNX12 (GAUZE/BANDAGES/DRESSINGS) ×4 IMPLANT
DEVICE PMI PUNCTURE CLOSURE (MISCELLANEOUS) ×3 IMPLANT
DEVICE SUT CK QUICK LOAD MINI (Prosthesis & Implant Heart) ×6 IMPLANT
DEVICE TROCAR PUNCTURE CLOSURE (ENDOMECHANICALS) ×3 IMPLANT
DRAIN CHANNEL 28F RND 3/8 FF (WOUND CARE) ×6 IMPLANT
DRAPE BILATERAL SPLIT (DRAPES) ×3 IMPLANT
DRAPE C-ARM 42X72 X-RAY (DRAPES) ×3 IMPLANT
DRAPE CV SPLIT W-CLR ANES SCRN (DRAPES) ×3 IMPLANT
DRAPE INCISE IOBAN 66X45 STRL (DRAPES) ×9 IMPLANT
DRAPE SLUSH/WARMER DISC (DRAPES) ×3 IMPLANT
DRSG COVADERM 4X8 (GAUZE/BANDAGES/DRESSINGS) ×3 IMPLANT
ELECT BLADE 6.5 EXT (BLADE) ×3 IMPLANT
ELECT REM PT RETURN 9FT ADLT (ELECTROSURGICAL) ×6
ELECTRODE REM PT RTRN 9FT ADLT (ELECTROSURGICAL) ×4 IMPLANT
FELT TEFLON 1X6 (MISCELLANEOUS) ×6 IMPLANT
FEMORAL VENOUS CANN RAP (CANNULA) IMPLANT
GAUZE SPONGE 4X4 12PLY STRL LF (GAUZE/BANDAGES/DRESSINGS) ×3 IMPLANT
GLOVE BIO SURGEON STRL SZ 6.5 (GLOVE) ×12 IMPLANT
GLOVE BIO SURGEON STRL SZ7.5 (GLOVE) ×3 IMPLANT
GLOVE ORTHO TXT STRL SZ7.5 (GLOVE) ×9 IMPLANT
GOWN STRL REUS W/ TWL LRG LVL3 (GOWN DISPOSABLE) ×8 IMPLANT
GOWN STRL REUS W/TWL LRG LVL3 (GOWN DISPOSABLE) ×4
IV NS 1000ML (IV SOLUTION) ×1
IV NS 1000ML BAXH (IV SOLUTION) ×2 IMPLANT
IV NS IRRIG 3000ML ARTHROMATIC (IV SOLUTION) ×3 IMPLANT
KIT BASIN OR (CUSTOM PROCEDURE TRAY) ×3 IMPLANT
KIT DILATOR VASC 18G NDL (KITS) ×3 IMPLANT
KIT DRAINAGE VACCUM ASSIST (KITS) ×3 IMPLANT
KIT ROOM TURNOVER OR (KITS) ×3 IMPLANT
KIT SUCTION CATH 14FR (SUCTIONS) ×3 IMPLANT
KIT SUT CK MINI COMBO 4X17 (Prosthesis & Implant Heart) ×3 IMPLANT
LEAD PACING MYOCARDI (MISCELLANEOUS) ×3 IMPLANT
LINE EXTENSION DELIVERY (MISCELLANEOUS) ×3 IMPLANT
LINE VENT (MISCELLANEOUS) ×3 IMPLANT
NEEDLE AORTIC ROOT 14G 7F (CATHETERS) ×3 IMPLANT
NS IRRIG 1000ML POUR BTL (IV SOLUTION) ×15 IMPLANT
PACK OPEN HEART (CUSTOM PROCEDURE TRAY) ×3 IMPLANT
PAD ARMBOARD 7.5X6 YLW CONV (MISCELLANEOUS) ×6 IMPLANT
PAD ELECT DEFIB RADIOL ZOLL (MISCELLANEOUS) ×3 IMPLANT
RING MITRAL MEMO 3D 30MM SMD30 (Prosthesis & Implant Heart) ×3 IMPLANT
SET CANNULATION TOURNIQUET (MISCELLANEOUS) ×3 IMPLANT
SET CARDIOPLEGIA MPS 5001102 (MISCELLANEOUS) ×3 IMPLANT
SET IRRIG TUBING LAPAROSCOPIC (IRRIGATION / IRRIGATOR) ×3 IMPLANT
SOLUTION ANTI FOG 6CC (MISCELLANEOUS) ×3 IMPLANT
SPONGE GAUZE 4X4 12PLY STER LF (GAUZE/BANDAGES/DRESSINGS) ×3 IMPLANT
SUT BONE WAX W31G (SUTURE) ×3 IMPLANT
SUT E-PACK MINIMALLY INVASIVE (SUTURE) ×3 IMPLANT
SUT ETHIBOND (SUTURE) ×6 IMPLANT
SUT ETHIBOND 2 0 SH (SUTURE) ×3 IMPLANT
SUT ETHIBOND 2-0 RB-1 WHT (SUTURE) ×6 IMPLANT
SUT ETHIBOND X763 2 0 SH 1 (SUTURE) ×3 IMPLANT
SUT GORETEX CV 4 TH 22 36 (SUTURE) ×3 IMPLANT
SUT GORETEX CV4 TH-18 (SUTURE) ×6 IMPLANT
SUT PROLENE 3 0 SH1 36 (SUTURE) ×12 IMPLANT
SUT SILK  1 MH (SUTURE) ×1
SUT SILK 1 MH (SUTURE) ×2 IMPLANT
SUT SILK 2 0 SH CR/8 (SUTURE) IMPLANT
SUT SILK 3 0 SH CR/8 (SUTURE) IMPLANT
SUT VIC AB 2-0 CTX 36 (SUTURE) IMPLANT
SUT VIC AB 3-0 SH 8-18 (SUTURE) IMPLANT
SUT VICRYL 2 TP 1 (SUTURE) IMPLANT
SYR 10ML LL (SYRINGE) ×3 IMPLANT
SYSTEM SAHARA CHEST DRAIN ATS (WOUND CARE) ×3 IMPLANT
TAPE CLOTH SURG 4X10 WHT LF (GAUZE/BANDAGES/DRESSINGS) ×3 IMPLANT
TAPE PAPER 2X10 WHT MICROPORE (GAUZE/BANDAGES/DRESSINGS) ×3 IMPLANT
TOWEL GREEN STERILE (TOWEL DISPOSABLE) ×12 IMPLANT
TOWEL GREEN STERILE FF (TOWEL DISPOSABLE) ×6 IMPLANT
TOWEL OR 17X24 6PK STRL BLUE (TOWEL DISPOSABLE) ×6 IMPLANT
TOWEL OR 17X26 10 PK STRL BLUE (TOWEL DISPOSABLE) ×6 IMPLANT
TRAY FOLEY SILVER 16FR TEMP (SET/KITS/TRAYS/PACK) ×3 IMPLANT
TROCAR XCEL BLADELESS 5X75MML (TROCAR) ×3 IMPLANT
TROCAR XCEL NON-BLD 11X100MML (ENDOMECHANICALS) ×6 IMPLANT
TUBE SUCT INTRACARD DLP 20F (MISCELLANEOUS) ×3 IMPLANT
TUNNELER SHEATH ON-Q 11GX8 DSP (PAIN MANAGEMENT) ×3 IMPLANT
UNDERPAD 30X30 (UNDERPADS AND DIAPERS) ×3 IMPLANT
WATER STERILE IRR 1000ML POUR (IV SOLUTION) ×6 IMPLANT
WIRE .035 3MM-J 145CM (WIRE) ×3 IMPLANT

## 2016-06-29 NOTE — Significant Event (Signed)
Attempted ventilator weaning. Patient still sleeping, RR 6-8. Precedex drip has been off for more than two hours. Will attempt again later.     Vernon Blackburn

## 2016-06-29 NOTE — Anesthesia Procedure Notes (Addendum)
Central Venous Catheter Insertion Performed by: Roberts Gaudy, anesthesiologist Start/End6/28/2018 7:00 AM, 06/29/2016 7:10 AM Patient location: Pre-op. Preanesthetic checklist: patient identified, IV checked, site marked, risks and benefits discussed, surgical consent, monitors and equipment checked, pre-op evaluation, timeout performed and anesthesia consent Position: Trendelenburg Lidocaine 1% used for infiltration and patient sedated Hand hygiene performed  and maximum sterile barriers used  Catheter size: 8.5 Fr Total catheter length 16. PA cath was placed.Sheath introducer Swan type:thermodilution Procedure performed using ultrasound guided technique. Ultrasound Notes:anatomy identified, needle tip was noted to be adjacent to the nerve/plexus identified, no ultrasound evidence of intravascular and/or intraneural injection and image(s) printed for medical record Attempts: 1 Following insertion, line sutured and dressing applied. Post procedure assessment: blood return through all ports, free fluid flow and no air  Patient tolerated the procedure well with no immediate complications.

## 2016-06-29 NOTE — Anesthesia Procedure Notes (Signed)
Procedure Name: Intubation Date/Time: 06/29/2016 8:04 AM Performed by: Garrison Columbus T Pre-anesthesia Checklist: Patient identified, Emergency Drugs available, Suction available and Patient being monitored Patient Re-evaluated:Patient Re-evaluated prior to inductionOxygen Delivery Method: Circle System Utilized Preoxygenation: Pre-oxygenation with 100% oxygen Intubation Type: IV induction Ventilation: Mask ventilation without difficulty and Oral airway inserted - appropriate to patient size Laryngoscope Size: Sabra Heck and 2 Grade View: Grade I Endobronchial tube: Left, Double lumen EBT, EBT position confirmed by auscultation and EBT position confirmed by fiberoptic bronchoscope and 41 Fr Number of attempts: 1 Airway Equipment and Method: Stylet and Oral airway Placement Confirmation: ETT inserted through vocal cords under direct vision,  positive ETCO2 and breath sounds checked- equal and bilateral Tube secured with: Tape Dental Injury: Teeth and Oropharynx as per pre-operative assessment

## 2016-06-29 NOTE — Anesthesia Procedure Notes (Signed)
Procedure Name: Intubation Date/Time: 06/29/2016 12:45 PM Performed by: Garrison Columbus T Pre-anesthesia Checklist: Patient identified, Emergency Drugs available, Suction available and Patient being monitored Patient Re-evaluated:Patient Re-evaluated prior to inductionOxygen Delivery Method: Circle System Utilized Preoxygenation: Pre-oxygenation with 100% oxygen Intubation Type: Inhalational induction Laryngoscope Size: Glidescope and 4 Grade View: Grade I Tube type: Oral Tube size: 8.0 mm Number of attempts: 1 Airway Equipment and Method: Stylet,  Oral airway,  Video-laryngoscopy and Bougie stylet Placement Confirmation: ETT inserted through vocal cords under direct vision,  positive ETCO2 and breath sounds checked- equal and bilateral Secured at: 24 cm Tube secured with: Tape Dental Injury: Teeth and Oropharynx as per pre-operative assessment

## 2016-06-29 NOTE — Progress Notes (Signed)
TCTS BRIEF SICU PROGRESS NOTE  Day of Surgery  S/P Procedure(s) (LRB): MINIMALLY INVASIVE MITRAL VALVE REPAIR  (MVR) (Right) TRANSESOPHAGEAL ECHOCARDIOGRAM (TEE) (N/A)   Sedated on vent NSR w/ stable hemodynamics, no drips O2 sats 100% Chest tube output low UOP 60-100 mL/hr Labs okay  Plan: Continue routine early postop  [name], MD 06/29/2016 5:19 PM

## 2016-06-29 NOTE — Anesthesia Procedure Notes (Signed)
Arterial Line Insertion Start/End6/28/2018 6:55 AM Performed by: Garrison Columbus T  Patient location: Pre-op. Preanesthetic checklist: patient identified, risks and benefits discussed, surgical consent, monitors and equipment checked, pre-op evaluation, timeout performed and anesthesia consent Lidocaine 1% used for infiltration and patient sedated Left, radial was placed Catheter size: 20 G Hand hygiene performed  and maximum sterile barriers used   Attempts: 1 Procedure performed without using ultrasound guided technique. Ultrasound Notes:anatomy identified and no ultrasound evidence of intravascular and/or intraneural injection Following insertion, dressing applied and Biopatch. Post procedure assessment: normal  Patient tolerated the procedure well with no immediate complications.

## 2016-06-29 NOTE — Anesthesia Procedure Notes (Signed)
Central Venous Catheter Insertion Performed by: Roberts Gaudy, anesthesiologist Start/End6/28/2018 7:10 AM, 06/29/2016 7:15 AM Patient location: Pre-op. Position: Trendelenburg Hand hygiene performed , maximum sterile barriers used  and Seldinger technique used Catheter size: 8 Fr Central line was placed.Double lumen Procedure performed using ultrasound guided technique. Ultrasound Notes:anatomy identified, needle tip was noted to be adjacent to the nerve/plexus identified and no ultrasound evidence of intravascular and/or intraneural injection Attempts: 1 Following insertion, line sutured and dressing applied. Post procedure assessment: blood return through all ports, free fluid flow and no air  Patient tolerated the procedure well with no immediate complications.

## 2016-06-29 NOTE — Interval H&P Note (Signed)
History and Physical Interval Note:  06/29/2016 5:45 AM  Vernon Blackburn  has presented today for surgery, with the diagnosis of MR  The various methods of treatment have been discussed with the patient and family. After consideration of risks, benefits and other options for treatment, the patient has consented to  Procedure(s): MINIMALLY INVASIVE MITRAL VALVE REPAIR OR REPLACEMENT (MVR) (Right) TRANSESOPHAGEAL ECHOCARDIOGRAM (TEE) (N/A) as a surgical intervention .  The patient's history has been reviewed, patient examined, no change in status, stable for surgery.  I have reviewed the patient's chart and labs.  Questions were answered to the patient's satisfaction.     [name]

## 2016-06-29 NOTE — Procedures (Signed)
Extubation Procedure Note  Patient Details:   Name: Vernon Blackburn DOB: 04/01/1965 MRN: [id number]   Airway Documentation:     Evaluation  O2 sats: stable throughout Complications: No apparent complications Patient did tolerate procedure well. Bilateral Breath Sounds: Clear   Yes  Patient was heart today and was extubated per rapid wean protocol. Patient had a NIF of -25 and a VC of 950-1L. Patient had a positive cuff leak and was extubated to 4 LPM nasal cannula with a current SPO2 of 100%. Patient was able to speak and vocalize name. No stridor noted. Patient in no distress. RN at bedside. RT will continue to monitor.   Lizzie Cokley M 06/29/2016, 8:03 PM

## 2016-06-29 NOTE — Transfer of Care (Signed)
Immediate Anesthesia Transfer of Care Note  Patient: Vernon Blackburn  Procedure(s) Performed: Procedure(s): MINIMALLY INVASIVE MITRAL VALVE REPAIR  (MVR) (Right) TRANSESOPHAGEAL ECHOCARDIOGRAM (TEE) (N/A)  Patient Location: ICU  Anesthesia Type:General  Level of Consciousness: sedated, unresponsive and Patient remains intubated per anesthesia plan  Airway & Oxygen Therapy: Patient remains intubated per anesthesia plan and Patient placed on Ventilator (see vital sign flow sheet for setting)  Post-op Assessment: Report given to RN and Post -op Vital signs reviewed and stable  Post vital signs: Reviewed and stable  Last Vitals:  Vitals:   06/29/16 0559  BP: (!) 139/92  Pulse: 77  Resp: 20  Temp: 36.8 C    Last Pain:  Vitals:   06/29/16 0600  TempSrc:   PainSc: 6       Patients Stated Pain Goal: 3 (85/27/78 2423)  Complications: No apparent anesthesia complications

## 2016-06-29 NOTE — Brief Op Note (Addendum)
06/29/2016  11:23 AM  PATIENT:  Vernon Blackburn  51 y.o. male  PRE-OPERATIVE DIAGNOSIS:  MR  POST-OPERATIVE DIAGNOSIS:  MR  PROCEDURE:  Procedure(s): MINIMALLY INVASIVE MITRAL VALVE REPAIR  (MVR) (Right) TRANSESOPHAGEAL ECHOCARDIOGRAM (TEE) (N/A)   SURGEON:    [name], MD  ASSISTANTS:  [name], PA-C  ANESTHESIA:   Roderic Palau, MD  CROSSCLAMP TIME:   58'  CARDIOPULMONARY BYPASS TIME: 41'  FINDINGS:  Dilated cardiomyopathy with moderate LV systolic dysfunction, EF 64%  Type I mitral valve dysfunction with moderate mitral regurgitation that worsened with provocative volume loading  No residual mitral regurgitation after successful valve repair with ring annuloplasty  COMPLICATIONS: None  BASELINE WEIGHT: 88.8 kg  PATIENT DISPOSITION:   TO SICU IN STABLE CONDITION  [name], MD 06/29/2016 12:23 PM

## 2016-06-29 NOTE — Anesthesia Postprocedure Evaluation (Signed)
Anesthesia Post Note  Patient: Vernon Blackburn  Procedure(s) Performed: Procedure(s) (LRB): MINIMALLY INVASIVE MITRAL VALVE REPAIR  (MVR) (Right) TRANSESOPHAGEAL ECHOCARDIOGRAM (TEE) (N/A)     Patient location during evaluation: SICU Anesthesia Type: General Level of consciousness: sedated Pain management: pain level controlled Vital Signs Assessment: post-procedure vital signs reviewed and stable Respiratory status: patient remains intubated per anesthesia plan Cardiovascular status: stable Anesthetic complications: no    Last Vitals:  Vitals:   06/29/16 1347 06/29/16 1400  BP:    Pulse: 70 68  Resp: 12 12  Temp: 36.1 C 36.1 C    Last Pain:  Vitals:   06/29/16 1400  TempSrc: Core (Comment)  PainSc:                  Ryot Burrous,W. EDMOND

## 2016-06-29 NOTE — Progress Notes (Signed)
  Echocardiogram Echocardiogram Transesophageal has been performed.  Vernon Blackburn Vernon Blackburn 06/29/2016, 8:54 AM

## 2016-06-29 NOTE — Significant Event (Addendum)
2nd attempt at ventilator weaning made with RRT.    Vernon Blackburn, Vernon Blackburn

## 2016-06-29 NOTE — Op Note (Signed)
CARDIOTHORACIC SURGERY OPERATIVE NOTE  Date of Procedure:  06/29/2016  Preoperative Diagnosis: Severe Mitral Regurgitation  Postoperative Diagnosis: Same  Procedure:    Minimally-Invasive Mitral Valve Repair  [name] (size 14mm, catalog # [id number], serial # [id number])    Surgeon: [name], MD  Assistant: [name], PA-C  Anesthesia: [name], MD  Operative Findings:  Dilated cardiomyopathy with moderate LV systolic dysfunction, EF 16%  Type I mitral valve dysfunction with moderate mitral regurgitation that worsened with provocative volume loading  No residual mitral regurgitation after successful valve repair with ring annuloplasty            BRIEF CLINICAL NOTE AND INDICATIONS FOR SURGERY  Patient is a 51 year old African-American male with history of mitral regurgitation, severe left ventricular systolic dysfunction with chronic systolic congestive heart failure, long-standing poorly controlled hypertension, chronic kidney disease (stage 3-4), and OSAwho has been referred for surgical consultation to discuss treatment options for management of severe symptomatic mitral regurgitation. The patient states that he has been told for most of his life that he had a heart murmur but he had never previously undergone any type of cardiac diagnostic evaluation until recently. He has long-standing history of hypertension, chronic kidney disease, and morbid obesity. He underwent gastric bypass surgery approximately 3 years ago and successfully lost a large amount of weight. He was noted to have severe hypertension and chronic kidney disease for which she has been followed for several years by Dr. [name]. Several months ago the patient began to develop worsening shortness of breath and lower extremity edema. He ultimately was hospitalized from 01/02/2016 through 01/06/2016 with accelerated hypertension and acute systolic congestive heart  failure, NYHA class IV. Transthoracic echocardiogram performed at that time revealed severe left ventricular systolic dysfunction with ejection fraction estimated 20-25%. There was severe mitral regurgitation,severe left atrial enlargement and moderately elevated pulmonary artery pressures. He improved clinically with medical treatment of hypertension and volume overload. Nuclear medicine stress test revealed perfusion defects consistent with previous septal and inferior wall myocardial infarctions but no reversible defects to suggest ischemia. There was global hypokinesis with ejection fraction calculated 33%. Diagnostic cardiac catheterization was not performed. The patient has been followed ever since by Dr. Radford Pax, and repeat echocardiogram performed 02/04/2016 revealed ejection fraction 25% with severe mitral regurgitation and moderate tricuspid regurgitation. Renal function remained stable with serum creatinine approximately 2.5. Clinically the patient did well on medical therapy improved symptoms of exertional shortness of breath and stable volume status. The patient was subsequently referred to [name] in the advanced heart failure clinic to perform follow-up transesophageal echocardiogram with the patient on optimal medical therapy. TEE performed 05/04/2016 revealed severe mitral regurgitation with severe left ventricular systolic dysfunction. Concerns were raised regarding the possibility that the patient might have combination of both primary and secondary mitral regurgitation, and was subsequentlyreferred for elective surgical consultation.  The patient has been seen in consultation and counseled at length regarding the indications, risks and potential benefits of surgery.  All questions have been answered, and the patient provides full informed consent for the operation as described.    DETAILS OF THE OPERATIVE PROCEDURE  Preparation:  The patient is brought to the operating room on the  above mentioned date and central monitoring was established by the anesthesia team including placement of Swan-Ganz catheter through the left internal jugular vein.  A radial arterial line is placed. The patient is placed in the supine position on the operating table.  Intravenous  antibiotics are administered. General endotracheal anesthesia is induced uneventfully. The patient is initially intubated using a dual lumen endotracheal tube.  A Foley catheter is placed.  Baseline transesophageal echocardiogram was performed.  Findings were notable for dilate left ventricle with moderate global LV systolic dysfunction, EF estimated 40%.  The mitral annulus was dilated to more than 4 cm.  There was normal mitral valve leaflets, subvalvular apparatus and leaflet motion.  There was a central jet of mitral regurgitation.  The mitral regurgitation did no appear severe, but it worsened with volume loading by raising the patient's legs.   Intraoperative consultation was obtained with [name] who agreed with the assessment and recommendation to proceed with ring annuloplasty due to the patient's history.  A soft roll is placed behind the patient's left scapula and the neck gently extended and turned to the left.   The patient's right neck, chest, abdomen, both groins, and both lower extremities are prepared and draped in a sterile manner. A time out procedure is performed.  Surgical Approach:  A right miniature anterolateral thoracotomy incision is performed. The incision is placed just lateral to and superior to the right nipple. The pectoralis major muscle is retracted medially and completely preserved. The right pleural space is entered through the 3rd intercostal space. A soft tissue retractor is placed.  Two 11 mm ports are placed through separate stab incisions inferiorly. The right pleural space is insufflated continuously with carbon dioxide gas through the posterior port during the remainder of the operation.   A pledgeted sutures placed through the dome of the right hemidiaphragm and retracted inferiorly to facilitate exposure.  A longitudinal incision is made in the pericardium 3 cm anterior to the phrenic nerve and silk traction sutures are placed on either side of the incision for exposure.   Extracorporeal Cardiopulmonary Bypass and Myocardial Protection:  A small incision is made in the right inguinal crease and the anterior surface of the right common femoral artery and right common femoral vein are identified.  The patient is placed in Trendelenburg position. The right internal jugular vein is cannulated with Seldinger technique and a guidewire advanced into the right atrium. The patient is heparinized systemically. The right internal jugular vein is cannulated with a 14 [place] pediatric femoral venous cannula. Pursestring sutures are placed on the anterior surface of the right common femoral vein and right common femoral artery. The right common femoral vein is cannulated with the Seldinger technique and a guidewire is advanced under transesophageal echocardiogram guidance through the right atrium. The femoral vein is cannulated with a long 22 French femoral venous cannula. The right common femoral artery is cannulated with Seldinger technique and a flexible guidewire is advanced until it can be appreciated intraluminally in the descending thoracic aorta on transesophageal echocardiogram. The femoral artery is cannulated with an 18 French femoral arterial cannula.  Adequate heparinization is verified.     The entire pre-bypass portion of the operation was notable for stable hemodynamics.  Cardiopulmonary bypass was begun.  Vacuum assist venous drainage is utilized. The incision in the pericardium is extended in both directions. Venous drainage and exposure are notably excellent. A retrograde cardioplegia cannula is placed through the right atrium into the coronary sinus using transesophageal  echocardiogram guidance.  An antegrade cardioplegia cannula is placed in the ascending aorta.    The patient is cooled to 28C systemic temperature.  The aortic cross clamp is applied and cardioplegia is delivered initially in an antegrade fashion through the aortic root  using modified [place] cold blood cardioplegia (KBC protocol).   The initial cardioplegic arrest is rapid with early diastolic arrest.  A total of 1300 mL of cardioplegia was administered antegrade through the aortic root and 200 mL retrograde through the coronary sinus catheter.  Myocardial protection was felt to be excellent.    Mitral Valve Repair:  A left atriotomy incision was performed through the interatrial groove and extended partially across the back wall of the left atrium after opening the oblique sinus inferiorly.  The mitral valve is exposed using a self-retaining retractor.  The mitral valve was inspected and notable for normal appearing leaflets and subvalvular apparatus.  The mitral annulus was dilated .  Interrupted 2-0 Ethibond horizontal mattress sutures are placed circumferentially around the entire mitral valve annulus. The valve was tested with saline and appeared competent even without ring annuloplasty complete. The valve was sized to a 32 mm annuloplasty ring, based upon the transverse distance between the left and right commissures and the height of the anterior leaflet, corresponding to a size just slightly larger than the overall surface area of the anterior leaflet.  The mitral valve was downsized and a Sorin Memo 3D annuloplasty ring (size 65mm, catalog #[id number], serial [id number]) was secured in place uneventfully. All ring sutures were secured using a Cor-knot device.    The valve was tested with saline and appeared competent. There is no residual leak. There was a broad, symmetrical line of coaptation of the anterior and posterior leaflet which was confirmed using the blue ink test.  Rewarming is  begun.   Procedure Completion:  The atriotomy was closed using a 2-layer closure of running 3-0 Prolene suture after placing a sump drain across the mitral valve to serve as a left ventricular vent.  One final dose of warm retrograde "reanimation dose" cardioplegia was administered retrograde through the coronary sinus catheter while all air was evacuated through the aortic root.  The aortic cross clamp was removed after a total cross clamp time of 57 minutes.  Epicardial pacing wires are fixed to the inferior wall of the right ventricule and to the right atrial appendage. The patient is rewarmed to 37C temperature. The left ventricular vent is removed.  The patient is ventilated and flow volumes turndown while the mitral valve repair is inspected using transesophageal echocardiogram. The valve repair appears intact with no residual leak. The antegrade cardioplegia cannula is now removed. The patient is weaned and disconnected from cardiopulmonary bypass.  The patient's rhythm at separation from bypass was sinus.  The patient was weaned from bypass without any inotropic support. Total cardiopulmonary bypass time for the operation was 98 minutes.  Followup transesophageal echocardiogram performed after separation from bypass revealed a well-seated annuloplasty ring in the mitral position with a normal functioning mitral valve. There was no residual leak.  Left ventricular function was unchanged from preoperatively.  The mean gradient across the mitral valve was estimated to be 4 mmHg.  The femoral arterial and venous cannulae were removed uneventfully. There was a palpable pulse in the distal right common femoral artery after removal of the cannula. Protamine was administered to reverse the anticoagulation. The right internal jugular cannula was removed and manual pressure held on the neck for 15 minutes.  Single lung ventilation was begun. The atriotomy closure was inspected for hemostasis. The  pericardial sac was drained using a 28 French Bard drain placed through the anterior port incision.  The pericardium was closed using a patch of core matrix bovine  submucosal tissue patch. The right pleural space is irrigated with saline solution and inspected for hemostasis. The right pleural space was drained using a 28 French Bard drain placed through the posterior port incision. The miniature thoracotomy incision was closed in multiple layers in routine fashion. The right groin incision was inspected for hemostasis and closed in multiple layers in routine fashion.  The post-bypass portion of the operation was notable for stable rhythm and hemodynamics.  No blood products were administered during the operation.   Disposition:  The patient tolerated the procedure well.  The patient was reintubated using a single lumen endotracheal tube and subsequently transported to the surgical intensive care unit in stable condition. There were no intraoperative complications. All sponge and instrument counts were verified correct but the needle count was incorrect at completion of the operation.  A portable chest radiograph was performed prior to completion to verify there were no retained needles or instruments in the patient.     [name] MD 06/29/2016 12:28 PM

## 2016-06-30 ENCOUNTER — Encounter (HOSPITAL_COMMUNITY): Payer: Self-pay | Admitting: Thoracic Surgery (Cardiothoracic Vascular Surgery)

## 2016-06-30 ENCOUNTER — Inpatient Hospital Stay (HOSPITAL_COMMUNITY): Payer: Medicaid Other

## 2016-06-30 LAB — BASIC METABOLIC PANEL
Anion gap: 4 — ABNORMAL LOW (ref 5–15)
BUN: 34 mg/dL — ABNORMAL HIGH (ref 6–20)
CO2: 27 mmol/L (ref 22–32)
Calcium: 7.8 mg/dL — ABNORMAL LOW (ref 8.9–10.3)
Chloride: 110 mmol/L (ref 101–111)
Creatinine, Ser: 2.2 mg/dL — ABNORMAL HIGH (ref 0.61–1.24)
GFR calc Af Amer: 39 mL/min — ABNORMAL LOW (ref >60)
GFR calc non Af Amer: 33 mL/min — ABNORMAL LOW (ref >60)
Glucose, Bld: 108 mg/dL — ABNORMAL HIGH (ref 65–99)
Potassium: 3.8 mmol/L (ref 3.5–5.1)
Sodium: 141 mmol/L (ref 135–145)

## 2016-06-30 LAB — CBC
HCT: 27.5 % — ABNORMAL LOW (ref 39.0–52.0)
HCT: 31.8 % — ABNORMAL LOW (ref 39.0–52.0)
Hemoglobin: 8.6 g/dL — ABNORMAL LOW (ref 13.0–17.0)
Hemoglobin: 9.8 g/dL — ABNORMAL LOW (ref 13.0–17.0)
MCH: 25.1 pg — ABNORMAL LOW (ref 26.0–34.0)
MCH: 25.4 pg — ABNORMAL LOW (ref 26.0–34.0)
MCHC: 31.3 g/dL (ref 30.0–36.0)
MCHC: 30.8 g/dL (ref 30.0–36.0)
MCV: 80.4 fL (ref 78.0–100.0)
MCV: 82.4 fL (ref 78.0–100.0)
Platelets: 182 10*3/uL (ref 150–400)
Platelets: 206 10*3/uL (ref 150–400)
RBC: 3.42 MIL/uL — ABNORMAL LOW (ref 4.22–5.81)
RBC: 3.86 MIL/uL — ABNORMAL LOW (ref 4.22–5.81)
RDW: 18.6 % — ABNORMAL HIGH (ref 11.5–15.5)
RDW: 19.3 % — ABNORMAL HIGH (ref 11.5–15.5)
WBC: 10.7 10*3/uL — ABNORMAL HIGH (ref 4.0–10.5)
WBC: 12.7 10*3/uL — ABNORMAL HIGH (ref 4.0–10.5)

## 2016-06-30 LAB — GLUCOSE, CAPILLARY
Glucose-Capillary: 108 mg/dL — ABNORMAL HIGH (ref 65–99)
Glucose-Capillary: 96 mg/dL (ref 65–99)
Glucose-Capillary: 119 mg/dL — ABNORMAL HIGH (ref 65–99)
Glucose-Capillary: 136 mg/dL — ABNORMAL HIGH (ref 65–99)
Glucose-Capillary: 139 mg/dL — ABNORMAL HIGH (ref 65–99)

## 2016-06-30 LAB — POCT I-STAT, CHEM 8
BUN: 33 mg/dL — ABNORMAL HIGH (ref 6–20)
Calcium, Ion: 1.2 mmol/L (ref 1.15–1.40)
Chloride: 103 mmol/L (ref 101–111)
Creatinine, Ser: 2 mg/dL — ABNORMAL HIGH (ref 0.61–1.24)
Glucose, Bld: 109 mg/dL — ABNORMAL HIGH (ref 65–99)
HCT: 29 % — ABNORMAL LOW (ref 39.0–52.0)
Hemoglobin: 9.9 g/dL — ABNORMAL LOW (ref 13.0–17.0)
Potassium: 4.3 mmol/L (ref 3.5–5.1)
Sodium: 140 mmol/L (ref 135–145)
TCO2: 29 mmol/L (ref 0–100)

## 2016-06-30 LAB — CREATININE, SERUM
Creatinine, Ser: 1.99 mg/dL — ABNORMAL HIGH (ref 0.61–1.24)
GFR calc Af Amer: 44 mL/min — ABNORMAL LOW (ref >60)
GFR calc non Af Amer: 38 mL/min — ABNORMAL LOW (ref >60)

## 2016-06-30 LAB — MAGNESIUM
Magnesium: 2.8 mg/dL — ABNORMAL HIGH (ref 1.7–2.4)
Magnesium: 2.6 mg/dL — ABNORMAL HIGH (ref 1.7–2.4)

## 2016-06-30 MED ORDER — CARVEDILOL 12.5 MG PO TABS
12.5000 mg | ORAL_TABLET | Freq: Two times a day (BID) | ORAL | Status: DC
  Administered 2016-06-30 – 2016-07-04 (×8): 12.5 mg via ORAL
  Filled 2016-06-30 (×9): qty 1

## 2016-06-30 MED ORDER — HYDRALAZINE HCL 20 MG/ML IJ SOLN
10.0000 mg | Freq: Four times a day (QID) | INTRAMUSCULAR | Status: DC | PRN
  Administered 2016-06-30 – 2016-07-02 (×2): 10 mg via INTRAVENOUS
  Filled 2016-06-30 (×2): qty 1

## 2016-06-30 MED ORDER — ENOXAPARIN SODIUM 30 MG/0.3ML ~~LOC~~ SOLN
30.0000 mg | SUBCUTANEOUS | Status: DC
  Administered 2016-07-01 – 2016-07-03 (×3): 30 mg via SUBCUTANEOUS
  Filled 2016-06-30 (×3): qty 0.3

## 2016-06-30 MED ORDER — FA-PYRIDOXINE-CYANOCOBALAMIN 2.5-25-2 MG PO TABS
1.0000 | ORAL_TABLET | Freq: Every day | ORAL | Status: DC
  Administered 2016-07-01 – 2016-07-04 (×4): 1 via ORAL
  Filled 2016-06-30 (×4): qty 1

## 2016-06-30 MED ORDER — WARFARIN - PHYSICIAN DOSING INPATIENT
Freq: Every day | Status: DC
  Administered 2016-07-02: 18:00:00

## 2016-06-30 MED ORDER — LABETALOL HCL 5 MG/ML IV SOLN
10.0000 mg | INTRAVENOUS | Status: DC | PRN
  Administered 2016-06-30 – 2016-07-02 (×2): 10 mg via INTRAVENOUS
  Filled 2016-06-30 (×3): qty 4

## 2016-06-30 MED ORDER — HYDRALAZINE HCL 50 MG PO TABS
100.0000 mg | ORAL_TABLET | Freq: Three times a day (TID) | ORAL | Status: DC
  Administered 2016-06-30 – 2016-07-04 (×8): 100 mg via ORAL
  Filled 2016-06-30 (×10): qty 2

## 2016-06-30 MED ORDER — AMLODIPINE BESYLATE 10 MG PO TABS
10.0000 mg | ORAL_TABLET | Freq: Every day | ORAL | Status: DC
  Administered 2016-06-30 – 2016-07-04 (×5): 10 mg via ORAL
  Filled 2016-06-30 (×5): qty 1

## 2016-06-30 MED ORDER — FEBUXOSTAT 40 MG PO TABS
80.0000 mg | ORAL_TABLET | Freq: Every day | ORAL | Status: DC
  Administered 2016-07-01 – 2016-07-04 (×4): 80 mg via ORAL
  Filled 2016-06-30 (×4): qty 2

## 2016-06-30 MED ORDER — WARFARIN SODIUM 2.5 MG PO TABS
2.5000 mg | ORAL_TABLET | Freq: Every day | ORAL | Status: DC
  Administered 2016-06-30 – 2016-07-01 (×2): 2.5 mg via ORAL
  Filled 2016-06-30 (×3): qty 1

## 2016-06-30 MED ORDER — ASPIRIN EC 325 MG PO TBEC
325.0000 mg | DELAYED_RELEASE_TABLET | Freq: Every day | ORAL | Status: AC
Start: 2016-06-30 — End: 2016-06-30
  Administered 2016-06-30: 325 mg via ORAL
  Filled 2016-06-30: qty 1

## 2016-06-30 MED ORDER — ASPIRIN EC 81 MG PO TBEC
81.0000 mg | DELAYED_RELEASE_TABLET | Freq: Every day | ORAL | Status: DC
  Administered 2016-07-01 – 2016-07-04 (×4): 81 mg via ORAL
  Filled 2016-06-30 (×4): qty 1

## 2016-06-30 MED ORDER — FUROSEMIDE 10 MG/ML IJ SOLN
40.0000 mg | Freq: Once | INTRAMUSCULAR | Status: AC
  Administered 2016-06-30: 40 mg via INTRAVENOUS
  Filled 2016-06-30: qty 4

## 2016-06-30 MED ORDER — POLYSACCHARIDE IRON COMPLEX 150 MG PO CAPS
150.0000 mg | ORAL_CAPSULE | Freq: Every day | ORAL | Status: DC
  Administered 2016-07-02 – 2016-07-04 (×3): 150 mg via ORAL
  Filled 2016-06-30 (×3): qty 1

## 2016-06-30 MED FILL — Lidocaine HCl IV Inj 20 MG/ML: INTRAVENOUS | Qty: 25 | Status: AC

## 2016-06-30 MED FILL — Sodium Chloride IV Soln 0.9%: INTRAVENOUS | Qty: 2000 | Status: AC

## 2016-06-30 MED FILL — Sodium Bicarbonate IV Soln 8.4%: INTRAVENOUS | Qty: 50 | Status: AC

## 2016-06-30 MED FILL — Mannitol IV Soln 20%: INTRAVENOUS | Qty: 1000 | Status: AC

## 2016-06-30 MED FILL — Electrolyte-R (PH 7.4) Solution: INTRAVENOUS | Qty: 3000 | Status: AC

## 2016-06-30 MED FILL — Heparin Sodium (Porcine) Inj 1000 Unit/ML: INTRAMUSCULAR | Qty: 30 | Status: AC

## 2016-06-30 NOTE — Progress Notes (Signed)
[address]       [place] [address]             [phone]        CARDIOTHORACIC SURGERY PROGRESS NOTE   R1 Day Post-Op Procedure(s) (LRB): MINIMALLY INVASIVE MITRAL VALVE REPAIR  (MVR) (Right) TRANSESOPHAGEAL ECHOCARDIOGRAM (TEE) (N/A)  Subjective: Looks good.  Minimal pain.  No SOB.  Reports already feeling better than preop  Objective: Vital signs: BP Readings from Last 1 Encounters:  06/30/16 (!) 119/93   Pulse Readings from Last 1 Encounters:  06/30/16 73   Resp Readings from Last 1 Encounters:  06/30/16 (!) 22   Temp Readings from Last 1 Encounters:  06/30/16 98.3 F (36.8 C) (Oral)    Hemodynamics: PAP: (27-48)/(7-23) 42/14 CO:  [3.7 L/min-6.2 L/min] 5.5 L/min CI:  [1.8 L/min/m2-3 L/min/m2] 2.7 L/min/m2  Physical Exam:  Rhythm:   sinus  Breath sounds: clear  Heart sounds:  RRR w/out murmur  Incisions:  Dressing dry, intact  Abdomen:  Soft, non-distended, non-tender  Extremities:  Warm, well-perfused  Chest tubes:  low volume thin serosanguinous output, no air leak    Intake/Output from previous day: 06/28 0701 - 06/29 0700 In: 5126.6 [I.V.:4096.6; Blood:800; IV Piggyback:230] Out: 8185 [Urine:1805; Emesis/NG output:60; Blood:1600; Chest Tube:550] Intake/Output this shift: No intake/output data recorded.  Lab Results:  CBC: Recent Labs  06/29/16 1844 06/30/16 0316  WBC 10.3 10.7*  HGB 8.6* 8.6*  HCT 27.2* 27.5*  PLT 154 182    BMET:  Recent Labs  06/27/16 1525  06/29/16 1832 06/29/16 1844 06/30/16 0316  NA 141  < > 142  --  141  K 3.5  < > 3.9  --  3.8  CL 105  < > 104  --  110  CO2 29  --   --   --  27  GLUCOSE 95  < > 108*  --  108*  BUN 32*  < > 34*  --  34*  CREATININE 2.24*  < > 2.20* 2.35* 2.20*  CALCIUM 8.6*  --   --   --  7.8*  < > = values in this interval not displayed.   PT/INR:   Recent Labs  06/29/16 1315  LABPROT 17.7*  INR 1.44    CBG (last 3)   Recent Labs  06/29/16 1650  06/29/16 1937 06/29/16 2342  GLUCAP 92 110* 107*    ABG    Component Value Date/Time   PHART 7.392 06/29/2016 2112   PCO2ART 45.7 06/29/2016 2112   PO2ART 149.0 (H) 06/29/2016 2112   HCO3 27.9 06/29/2016 2112   TCO2 29 06/29/2016 2112   ACIDBASEDEF 1.0 06/07/2016 1317   O2SAT 99.0 06/29/2016 2112    CXR: PORTABLE CHEST 1 VIEW  COMPARISON:  06/29/2016.  FINDINGS: Interim extubation and removal of NG tube. Left IJ line and Swan-Ganz catheter in stable position. Right chest tubes in stable position. No pneumothorax. Cardiac valve replacement . Cardiomegaly with mild bilateral interstitial prominence and small pleural effusions suggesting CHF. Persistent basilar atelectasis.  IMPRESSION: 1. Lines and tubes including right chest tubes in stable position. No pneumothorax.  2. Cardiac valve replacement. Congestive heart failure mild basilar interstitial edema small pleural effusions noted on today's exam.  3. Persistent basilar atelectasis .   Electronically Signed   By: Marcello Moores  Register   On: 06/30/2016 07:28   EKG: NSR w/out acute ischemic changes, 1st degree AV block   Assessment/Plan: S/P Procedure(s) (LRB): MINIMALLY INVASIVE  MITRAL VALVE REPAIR  (MVR) (Right) TRANSESOPHAGEAL ECHOCARDIOGRAM (TEE) (N/A)  Doing very well POD1 Maintaining NSR w/ stable hemodynamics, no drips Breathing comfortably w/ O2 sats 100% on 4 L/min Long-standing severe hypertension, currently well controlled Dilated non-ischemic cardiomyopathy w/ chronic systolic CHF w/ expected post op volume excess Anemia of chronic disease w/ expected post op acute blood loss anemia, Hgb 8.6 stable Expected post op atelectasis, mild CKD stage IV, creatinine stable near baseline   Mobilize  Restart Norvasc  Restart Hydralazine at reduced dose and titrate up as needed  Hold Diovan for now and watch renal function  Gentle diuresis  Start Coumadin  Leave chest tubes until output  decreases  Possible transfer step down later today or tomorrow   [name], MD 06/30/2016 7:51 AM

## 2016-06-30 NOTE — Discharge Summary (Signed)
Physician Discharge Summary  Patient ID: Vernon Blackburn MRN: [id number] DOB/AGE: 1965/06/04 51 y.o.  Admit date: 06/29/2016 Discharge date: 07/04/2016  Admission Diagnoses:Severe mitral regurgitation and congestive heart failure  Discharge Diagnoses:  Principal Problem:   S/P minimally invasive mitral valve repair Active Problems:   CKD (chronic kidney disease)   Anemia, chronic disease   Pulmonary HTN (HCC)   Severe mitral regurgitation   Cardiomyopathy ([place])   Chronic systolic HF (heart failure) (HCC)   Hypertensive heart and chronic kidney disease with heart failure and stage 1 through stage 4 chronic kidney disease, or chronic kidney disease (HCC)   CHF (congestive heart failure) ([place])  Patient Active Problem List   Diagnosis Date Noted  . S/P minimally invasive mitral valve repair 06/29/2016  . AKI (acute kidney injury) ([place]) 06/07/2016  . CHF (congestive heart failure) ([place]) 06/06/2016  . Hypertensive heart and chronic kidney disease with heart failure and stage 1 through stage 4 chronic kidney disease, or chronic kidney disease ([place]) 02/27/2016  . Chronic systolic HF (heart failure) ([place])   . Cardiomyopathy ([place])   . Acute renal failure superimposed on chronic kidney disease ([place])   . Pulmonary HTN ([place])   . Severe mitral regurgitation   . DOE (dyspnea on exertion) 01/02/2016  . CKD (chronic kidney disease)   . Anemia, chronic disease   . Edema 11/15/2011   HPI:  Patient is a 51 year old African-American male with history of mitral regurgitation, severe left ventricular systolic dysfunction with chronic systolic congestive heart failure, long-standing poorly controlled hypertension, chronic kidney disease (stage 3-4), and OSAwho has been referred for surgical consultation to discuss treatment options for management of severe symptomatic mitral regurgitation. The patient states that he has been told for most of his life that he had a heart murmur but he had never  previously undergone any type of cardiac diagnostic evaluation until recently. He has long-standing history of hypertension, chronic kidney disease, and morbid obesity. He underwent gastric bypass surgery approximately 3 years ago and successfully lost a large amount of weight. He was noted to have severe hypertension and chronic kidney disease for which she has been followed for several years by Dr. [name]. Several months ago the patient began to develop worsening shortness of breath and lower extremity edema. He ultimately was hospitalized from 01/02/2016 through 01/06/2016 with accelerated hypertension and acute systolic congestive heart failure, NYHA class IV. Transthoracic echocardiogram performed at that time revealed severe left ventricular systolic dysfunction with ejection fraction estimated 20-25%. There was severe mitral regurgitation,severe left atrial enlargement and moderately elevated pulmonary artery pressures. He improved clinically with medical treatment of hypertension and volume overload. Nuclear medicine stress test revealed perfusion defects consistent with previous septal and inferior wall myocardial infarctions but no reversible defects to suggest ischemia. There was global hypokinesis with ejection fraction calculated 33%. Diagnostic cardiac catheterization was not performed. The patient has been followed ever since by Dr. Radford Pax, and repeat echocardiogram performed 02/04/2016 revealed ejection fraction 25% with severe mitral regurgitation and moderate tricuspid regurgitation. Renal function remained stable with serum creatinine approximately 2.5. Clinically the patient did well on medical therapy improved symptoms of exertional shortness of breath and stable volume status. The patient was subsequently referred to [name] in the advanced heart failure clinic to perform follow-up transesophageal echocardiogram with the patient on optimal medical therapy. TEE performed 05/04/2016  revealed severe mitral regurgitation with severe left ventricular systolic dysfunction. Concerns were raised regarding the possibility that the patient might have combination of both  primary and secondary mitral regurgitation, and was subsequentlyreferred for elective surgical consultation.  The patient lives locally in [place] and just graduated from Sports coach school. 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 He is separated from his wife and headed for divorce. He has 2 adult children. He has a long-standing history of severe hypertension and admits that for many years he was not good about taking medications or taking care of himself. Although he was physically active and played football in college, shortly after that he became morbidly obese. 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 Approximately 3 years ago he underwent gastric bypass surgery and lost a tremendous amount of weight. He currently weighs approximately 225 pounds and is 5 foot 10 inches tall. He states that he was told during his youth that he had a heart murmur but that it was nothing to worry about. He never had any kind of cardiac evaluation until several months ago when he presented with acute systolic congestive heart failure as noted above. Since hospital discharge in January the patient has been compliant with medical therapy and following up regularly with all of his doctors. He has done well clinically and remained quite stable. He admits that he does get short of breath with more strenuous exertion but this does not limit his daily activities. He tries to walk every day. He denies any resting shortness of breath, PND, orthopnea, dizzy spells, or syncope. He still has some intermittent lower extremity edema and he reports occasional palpitations. He has intermittent wheezing that has been attributed to  asthma. He has sleep apnea but doesn't use his CPAP very often.   Patient returns to the office today for follow-up of stage D severe symptomatic mitral regurgitation. He was originally seen in consultation on 05/25/2016. Since then he underwent left and right heart catheterization by [name]. He returns to the office today to review the results of his catheterization and discuss treatment options further. He states that he continues to improve with medical therapy. He has lost several more pounds in weight with diuretic therapy and further adjustment of his antihypertensive medications by [name]. He still gets short of breath with activity but he is feeling better and his renal function has remained stable.  The patient was admitted electively for minimally invasive mitral valve repair by Dr. Roxy Manns.   Discharged Condition: good  Hospital Course: The patient was admitted electively for the procedure. On 06/29/2016 he underwent the below described procedure. She tolerated well was taken to the surgical intensive care unit in stable condition.  Postoperative hospital course:  The patient is overall done quite well. He is maintained stable hemodynamics. He was weaned from the ventilator without difficulty. He has an expected acute blood loss anemia and values are stable. He is maintaining normal sinus rhythm. Blood pressure medications are being adjusted over time and control currently is good. He has chronic stage IV renal disease and creatinine is near baseline. Coumadin has been started. Chest tubes were discontinued after drainage decreased. Incis are healing well. He is ambulating well and Oxygen has been weaned. Heis stable for discharge on coumadin 10 mg for INR 1.19 and will be rechecked on Friday of this week.  Consults: None  Significant Diagnostic Studies: routine post op labs and serial CXR's  Treatments: surgery:  CARDIOTHORACIC SURGERY OPERATIVE NOTE  Date of  Procedure:                 06/29/2016  Preoperative Diagnosis:      Severe Mitral Regurgitation  Postoperative Diagnosis:    Same  Procedure:        Minimally-Invasive Mitral Valve Repair             [name] (size 42mm, catalog # [id number], serial # [id number])               Surgeon:        [name], MD  Assistant:       [name], PA-C  Anesthesia:    [name], MD  Operative Findings:  Dilated cardiomyopathy with moderate LV systolic dysfunction, EF 57%  Type I mitral valve dysfunction with moderate mitral regurgitation that worsened with provocative volume loading  No residual mitral regurgitation after successful valve repair with ring annuloplasty  Discharge Exam: Blood pressure 117/79, pulse 93, temperature 99 F (37.2 C), temperature source Oral, resp. rate 18, height 5\' 10"  (1.778 m), weight 205 lb 4.8 oz (93.1 kg), SpO2 99 %.  General appearance: alert, cooperative and no distress Heart: regular rate and rhythm Lungs: clear to auscultation bilaterally Abdomen: benign Extremities: no edema Wound: incis healing well Disposition: 01-Home or Self Care  Discharge Instructions    Amb Referral to Cardiac Rehabilitation    Complete by:  As directed    Diagnosis:  Valve Repair   Valve:  Mitral Comment - mini     Allergies as of 07/04/2016      Reactions   No Known Allergies       Medication List    STOP taking these medications   albuterol (5 MG/ML) 0.5% nebulizer solution Commonly known as:  PROVENTIL   albuterol 108 (90 Base) MCG/ACT inhaler Commonly known as:  PROVENTIL HFA;VENTOLIN HFA   furosemide 40 MG tablet Commonly known as:  LASIX   lisdexamfetamine 60 MG capsule Commonly known as:  VYVANSE   potassium chloride 10 MEQ tablet Commonly known as:  K-DUR   valsartan 160 MG tablet Commonly known as:  DIOVAN     TAKE these medications   amLODipine 10 MG tablet Commonly known as:  NORVASC Take 1 tablet (10  mg total) by mouth daily.   aspirin 81 MG EC tablet Take 1 tablet (81 mg total) by mouth daily.   carvedilol 12.5 MG tablet Commonly known as:  COREG Take 2 tablets (25 mg total) by mouth 2 (two) times daily. What changed:  medication strength   cholecalciferol 1000 units tablet Commonly known as:  VITAMIN D Take 1,000 Units by mouth daily.   FERREX 28 PO Take 1 tablet by mouth daily.   hydrALAZINE 100 MG tablet Commonly known as:  APRESOLINE Take 1 tablet (100 mg total) by mouth 3 (three) times daily.   isosorbide mononitrate 30 MG 24 hr tablet Commonly known as:  IMDUR Take 1 tablet (30 mg total) by mouth daily. What changed:  medication strength  how much to take   multivitamin with minerals Tabs tablet Take 1 tablet by mouth daily.   oxyCODONE 5 MG immediate release tablet Commonly known as:  Oxy IR/ROXICODONE Take 1-2 tablets (5-10 mg total) by mouth every 6 (six) hours as needed for severe pain.   ULORIC 80 MG Tabs Generic drug:  Febuxostat Take 80 mg by mouth daily.   warfarin 5 MG tablet Commonly known as:  COUMADIN Take 2 tablets (  10 mg total) by mouth daily at 6 PM. As directed by the coumadin clinic      Follow-up Information    [name], MD Follow up.   Specialty:  Cardiothoracic Surgery Why:  07/24/2016 at 4:30 PM to see Dr. Roxy Manns. Please obtain a chest x-ray [facility] at 4 PM. [facility] imaging is located in the same office complex. Contact information: [address] [place] [address] [phone]        Sueanne Margarita, MD Follow up.   Specialty:  Cardiology Why:  Please see discharge instructions paperwork. Appointments are being arranged for PT/INR blood test next week as well as two-week cardiologist follow-up appointment. Contact information: [address] [place] [address] [phone]          The patient has been discharged on:   1.Beta Blocker:  Yes Blue.Reese   ]                               No   [   ]                              If No, reason:  2.Ace Inhibitor/ARB: Yes [   ]                                     No  [ n   ]                                     If No, reason:held for renal insuff  3.Statin:   Yes [   ]                  No  [  n ]                  If No, reason:no hypercholesterolemia  4.Shela CommonsVelta Addison  [  y ]                  No   [   ]                  If No, reason:  Signed: Emalynn Clewis E 07/04/2016, 12:47 PM

## 2016-06-30 NOTE — Care Management Note (Signed)
Case Management Note Marvetta Gibbons RN, BSN Unit 2W-Case Manager-- [place] coverage [phone]  Patient Details  Name: Vernon Blackburn MRN: [id number] Date of Birth: Sep 16, 1965  Subjective/Objective:  Pt admitted s/p mini MVR on 06/29/16                  Action/Plan: PTA pt lived at [facility] to follow for d/c needs  Expected Discharge Date:                  Expected Discharge Plan:  Home/Self Care  In-House Referral:     Discharge planning Services  CM Consult  Post Acute Care Choice:    Choice offered to:     DME Arranged:    DME Agency:     HH Arranged:    HH Agency:     Status of Service:  In process, will continue to follow  If discussed at Long Length of Stay Meetings, dates discussed:    Discharge Disposition:   Additional Comments:  Dawayne Patricia, RN 06/30/2016, 10:35 AM

## 2016-07-01 ENCOUNTER — Inpatient Hospital Stay (HOSPITAL_COMMUNITY): Payer: Medicaid Other

## 2016-07-01 LAB — TYPE AND SCREEN
ABO/RH(D): O POS
Antibody Screen: NEGATIVE
Unit division: 0
Unit division: 0
Unit division: 0
Unit division: 0
Unit division: 0
Unit division: 0

## 2016-07-01 LAB — BPAM RBC
Blood Product Expiration Date: 201807202359
Blood Product Expiration Date: 201807202359
Blood Product Expiration Date: 201807202359
Blood Product Expiration Date: 201807202359
Blood Product Expiration Date: 201807202359
Blood Product Expiration Date: 201807202359
ISSUE DATE / TIME: 201806280631
ISSUE DATE / TIME: 201806280631
ISSUE DATE / TIME: 201806280631
ISSUE DATE / TIME: 201806280631
Unit Type and Rh: 5100
Unit Type and Rh: 5100
Unit Type and Rh: 5100
Unit Type and Rh: 5100
Unit Type and Rh: 5100
Unit Type and Rh: 5100

## 2016-07-01 LAB — BASIC METABOLIC PANEL
Anion gap: 4 — ABNORMAL LOW (ref 5–15)
BUN: 33 mg/dL — ABNORMAL HIGH (ref 6–20)
CO2: 28 mmol/L (ref 22–32)
Calcium: 7.9 mg/dL — ABNORMAL LOW (ref 8.9–10.3)
Chloride: 105 mmol/L (ref 101–111)
Creatinine, Ser: 2.13 mg/dL — ABNORMAL HIGH (ref 0.61–1.24)
GFR calc Af Amer: 40 mL/min — ABNORMAL LOW (ref >60)
GFR calc non Af Amer: 35 mL/min — ABNORMAL LOW (ref >60)
Glucose, Bld: 99 mg/dL (ref 65–99)
Potassium: 4.1 mmol/L (ref 3.5–5.1)
Sodium: 137 mmol/L (ref 135–145)

## 2016-07-01 LAB — GLUCOSE, CAPILLARY
Glucose-Capillary: 108 mg/dL — ABNORMAL HIGH (ref 65–99)
Glucose-Capillary: 127 mg/dL — ABNORMAL HIGH (ref 65–99)
Glucose-Capillary: 84 mg/dL (ref 65–99)
Glucose-Capillary: 90 mg/dL (ref 65–99)
Glucose-Capillary: 98 mg/dL (ref 65–99)

## 2016-07-01 LAB — CBC
HCT: 29.4 % — ABNORMAL LOW (ref 39.0–52.0)
Hemoglobin: 9 g/dL — ABNORMAL LOW (ref 13.0–17.0)
MCH: 25.1 pg — ABNORMAL LOW (ref 26.0–34.0)
MCHC: 30.6 g/dL (ref 30.0–36.0)
MCV: 81.9 fL (ref 78.0–100.0)
Platelets: 192 10*3/uL (ref 150–400)
RBC: 3.59 MIL/uL — ABNORMAL LOW (ref 4.22–5.81)
RDW: 19 % — ABNORMAL HIGH (ref 11.5–15.5)
WBC: 10.8 10*3/uL — ABNORMAL HIGH (ref 4.0–10.5)

## 2016-07-01 LAB — PROTIME-INR
INR: 1.16
Prothrombin Time: 14.8 seconds (ref 11.4–15.2)

## 2016-07-01 MED ORDER — CHLORHEXIDINE GLUCONATE CLOTH 2 % EX PADS
6.0000 | MEDICATED_PAD | Freq: Every day | CUTANEOUS | Status: DC
  Administered 2016-07-01 – 2016-07-02 (×2): 6 via TOPICAL

## 2016-07-01 MED ORDER — SODIUM CHLORIDE 0.9% FLUSH: 10.0000 mL | INTRAVENOUS | Status: DC | PRN

## 2016-07-01 MED ORDER — SODIUM CHLORIDE 0.9% FLUSH: 10.0000 mL | Freq: Two times a day (BID) | INTRAVENOUS | Status: DC

## 2016-07-01 NOTE — Progress Notes (Signed)
2 Days Post-Op Procedure(s) (LRB): MINIMALLY INVASIVE MITRAL VALVE REPAIR  (MVR) (Right) TRANSESOPHAGEAL ECHOCARDIOGRAM (TEE) (N/A) Subjective: No complaints  Objective: Vital signs in last 24 hours: Temp:  [97.8 F (36.6 C)-98.8 F (37.1 C)] 98.1 F (36.7 C) (06/30 1157) Pulse Rate:  [65-84] 70 (06/30 1200) Cardiac Rhythm: Normal sinus rhythm (06/30 0800) Resp:  [7-24] 16 (06/30 1200) BP: (81-125)/(61-94) 97/67 (06/30 1200) SpO2:  [81 %-100 %] 94 % (06/30 1200) Weight:  [89.8 kg (197 lb 15.6 oz)] 89.8 kg (197 lb 15.6 oz) (06/30 0500)  Hemodynamic parameters for last 24 hours:    Intake/Output from previous day: 06/29 0701 - 06/30 0700 In: 330 [I.V.:230; IV Piggyback:100] Out: 1980 [Urine:1650; Chest Tube:330] Intake/Output this shift: Total I/O In: 420 [P.O.:360; I.V.:60] Out: 50 [Chest Tube:50]  General appearance: sleepy but awakens and follows commands Neurologic: intact Heart: regular rate and rhythm, S1, S2 normal, no murmur, click, rub or gallop Lungs: clear to auscultation bilaterally Extremities: extremities normal, atraumatic, no cyanosis or edema Wound: incision ok  Lab Results:  Recent Labs  06/30/16 1709 06/30/16 1716 07/01/16 0401  WBC 12.7*  --  10.8*  HGB 9.8* 9.9* 9.0*  HCT 31.8* 29.0* 29.4*  PLT 206  --  192   BMET:  Recent Labs  06/30/16 0316  06/30/16 1716 07/01/16 0401  NA 141  --  140 137  K 3.8  --  4.3 4.1  CL 110  --  103 105  CO2 27  --   --  28  GLUCOSE 108*  --  109* 99  BUN 34*  --  33* 33*  CREATININE 2.20*  < > 2.00* 2.13*  CALCIUM 7.8*  --   --  7.9*  < > = values in this interval not displayed.  PT/INR:  Recent Labs  07/01/16 0401  LABPROT 14.8  INR 1.16   ABG    Component Value Date/Time   PHART 7.392 06/29/2016 2112   HCO3 27.9 06/29/2016 2112   TCO2 29 06/30/2016 1716   ACIDBASEDEF 1.0 06/07/2016 1317   O2SAT 99.0 06/29/2016 2112   CBG (last 3)   Recent Labs  06/30/16 1931 06/30/16 2357  07/01/16 0354  GLUCAP 139* 90 98    Assessment/Plan: S/P Procedure(s) (LRB): MINIMALLY INVASIVE MITRAL VALVE REPAIR  (MVR) (Right) TRANSESOPHAGEAL ECHOCARDIOGRAM (TEE) (N/A)  POD 2 Stable hemodynamics on current antihypertensives. Chest tube output decreasing. Should be able to remove tomorrow. Continue coumadin Stage IV CKD: creat stable Continue IS, ambulation. He is sluggish and requires a lot of supervision to get moving.      LOS: 2 days    Gaye Pollack 07/01/2016

## 2016-07-02 LAB — GLUCOSE, CAPILLARY
Glucose-Capillary: 90 mg/dL (ref 65–99)
Glucose-Capillary: 90 mg/dL (ref 65–99)
Glucose-Capillary: 96 mg/dL (ref 65–99)
Glucose-Capillary: 90 mg/dL (ref 65–99)

## 2016-07-02 LAB — PROTIME-INR
INR: 1.11
Prothrombin Time: 14.4 seconds (ref 11.4–15.2)

## 2016-07-02 MED ORDER — ONDANSETRON HCL 4 MG PO TABS: 4.0000 mg | ORAL_TABLET | Freq: Four times a day (QID) | ORAL | Status: DC | PRN

## 2016-07-02 MED ORDER — DOCUSATE SODIUM 100 MG PO CAPS
200.0000 mg | ORAL_CAPSULE | Freq: Every day | ORAL | Status: DC
  Administered 2016-07-03 – 2016-07-04 (×2): 200 mg via ORAL
  Filled 2016-07-02 (×2): qty 2

## 2016-07-02 MED ORDER — SODIUM CHLORIDE 0.9% FLUSH
3.0000 mL | Freq: Two times a day (BID) | INTRAVENOUS | Status: DC
  Administered 2016-07-02 – 2016-07-04 (×4): 3 mL via INTRAVENOUS

## 2016-07-02 MED ORDER — OXYCODONE HCL 5 MG PO TABS
5.0000 mg | ORAL_TABLET | ORAL | Status: DC | PRN
  Administered 2016-07-02 – 2016-07-04 (×2): 5 mg via ORAL
  Filled 2016-07-02 (×2): qty 1

## 2016-07-02 MED ORDER — ONDANSETRON HCL 4 MG/2ML IJ SOLN: 4.0000 mg | Freq: Four times a day (QID) | INTRAMUSCULAR | Status: DC | PRN

## 2016-07-02 MED ORDER — MOVING RIGHT ALONG BOOK
Freq: Once | Status: DC
  Filled 2016-07-02: qty 1

## 2016-07-02 MED ORDER — PANTOPRAZOLE SODIUM 40 MG PO TBEC
40.0000 mg | DELAYED_RELEASE_TABLET | Freq: Every day | ORAL | Status: DC
  Administered 2016-07-03 – 2016-07-04 (×2): 40 mg via ORAL
  Filled 2016-07-02 (×2): qty 1

## 2016-07-02 MED ORDER — BISACODYL 5 MG PO TBEC: 10.0000 mg | DELAYED_RELEASE_TABLET | Freq: Every day | ORAL | Status: DC | PRN

## 2016-07-02 MED ORDER — TRAMADOL HCL 50 MG PO TABS
50.0000 mg | ORAL_TABLET | ORAL | Status: DC | PRN
  Administered 2016-07-02: 50 mg via ORAL
  Administered 2016-07-03: 100 mg via ORAL
  Filled 2016-07-02 (×3): qty 1

## 2016-07-02 MED ORDER — ACETAMINOPHEN 325 MG PO TABS
650.0000 mg | ORAL_TABLET | Freq: Four times a day (QID) | ORAL | Status: DC | PRN
  Administered 2016-07-03 – 2016-07-04 (×2): 650 mg via ORAL
  Filled 2016-07-02 (×2): qty 2

## 2016-07-02 MED ORDER — BISACODYL 10 MG RE SUPP: 10.0000 mg | Freq: Every day | RECTAL | Status: DC | PRN

## 2016-07-02 MED ORDER — SODIUM CHLORIDE 0.9 % IV SOLN: 250.0000 mL | INTRAVENOUS | Status: DC | PRN

## 2016-07-02 MED ORDER — WARFARIN SODIUM 5 MG PO TABS
5.0000 mg | ORAL_TABLET | Freq: Once | ORAL | Status: AC
  Administered 2016-07-02: 5 mg via ORAL
  Filled 2016-07-02: qty 1

## 2016-07-02 MED ORDER — SODIUM CHLORIDE 0.9% FLUSH: 3.0000 mL | INTRAVENOUS | Status: DC | PRN

## 2016-07-02 NOTE — Progress Notes (Signed)
3 Days Post-Op Procedure(s) (LRB): MINIMALLY INVASIVE MITRAL VALVE REPAIR  (MVR) (Right) TRANSESOPHAGEAL ECHOCARDIOGRAM (TEE) (N/A) Subjective:  No complaints  Objective: Vital signs in last 24 hours: Temp:  [98.3 F (36.8 C)-99.2 F (37.3 C)] 98.8 F (37.1 C) (07/01 1100) Pulse Rate:  [71-98] 79 (07/01 1400) Cardiac Rhythm: Normal sinus rhythm (07/01 1400) Resp:  [0-24] 21 (07/01 1400) BP: (89-138)/(67-106) 138/106 (07/01 1400) SpO2:  [91 %-100 %] 99 % (07/01 1400) Weight:  [90.3 kg (199 lb 1.2 oz)] 90.3 kg (199 lb 1.2 oz) (07/01 0600)  Hemodynamic parameters for last 24 hours:    Intake/Output from previous day: 06/30 0701 - 07/01 0700 In: 1090 [P.O.:850; I.V.:240] Out: 795 [Urine:625; Chest Tube:170] Intake/Output this shift: Total I/O In: 480 [P.O.:400; I.V.:80] Out: 200 [Urine:200]  General appearance: alert and cooperative Neurologic: intact Heart: regular rate and rhythm, S1, S2 normal, no murmur, click, rub or gallop Lungs: clear to auscultation bilaterally Extremities: extremities normal, atraumatic, no cyanosis or edema Wound: incision ok  Lab Results:  Recent Labs  06/30/16 1709 06/30/16 1716 07/01/16 0401  WBC 12.7*  --  10.8*  HGB 9.8* 9.9* 9.0*  HCT 31.8* 29.0* 29.4*  PLT 206  --  192   BMET:  Recent Labs  06/30/16 0316  06/30/16 1716 07/01/16 0401  NA 141  --  140 137  K 3.8  --  4.3 4.1  CL 110  --  103 105  CO2 27  --   --  28  GLUCOSE 108*  --  109* 99  BUN 34*  --  33* 33*  CREATININE 2.20*  < > 2.00* 2.13*  CALCIUM 7.8*  --   --  7.9*  < > = values in this interval not displayed.  PT/INR:  Recent Labs  07/02/16 0347  LABPROT 14.4  INR 1.11   ABG    Component Value Date/Time   PHART 7.392 06/29/2016 2112   HCO3 27.9 06/29/2016 2112   TCO2 29 06/30/2016 1716   ACIDBASEDEF 1.0 06/07/2016 1317   O2SAT 99.0 06/29/2016 2112   CBG (last 3)   Recent Labs  07/02/16 0358 07/02/16 0735 07/02/16 1155  GLUCAP 90 90 96     Assessment/Plan: S/P Procedure(s) (LRB): MINIMALLY INVASIVE MITRAL VALVE REPAIR  (MVR) (Right) TRANSESOPHAGEAL ECHOCARDIOGRAM (TEE) (N/A)  He is hemodynamically stable in sinus rhythm. BP under good control on current regimen.  Chest tube output low. Will remove tubes  Continue Coumadin for MV repair.  Transfer to 2W and continue mobilization   LOS: 3 days    Gaye Pollack 07/02/2016

## 2016-07-03 ENCOUNTER — Inpatient Hospital Stay (HOSPITAL_COMMUNITY): Payer: Medicaid Other

## 2016-07-03 LAB — GLUCOSE, CAPILLARY: Glucose-Capillary: 97 mg/dL (ref 65–99)

## 2016-07-03 LAB — BASIC METABOLIC PANEL
Anion gap: 8 (ref 5–15)
BUN: 27 mg/dL — ABNORMAL HIGH (ref 6–20)
CO2: 27 mmol/L (ref 22–32)
Calcium: 8.4 mg/dL — ABNORMAL LOW (ref 8.9–10.3)
Chloride: 103 mmol/L (ref 101–111)
Creatinine, Ser: 1.83 mg/dL — ABNORMAL HIGH (ref 0.61–1.24)
GFR calc Af Amer: 48 mL/min — ABNORMAL LOW (ref >60)
GFR calc non Af Amer: 42 mL/min — ABNORMAL LOW (ref >60)
Glucose, Bld: 124 mg/dL — ABNORMAL HIGH (ref 65–99)
Potassium: 3.9 mmol/L (ref 3.5–5.1)
Sodium: 138 mmol/L (ref 135–145)

## 2016-07-03 LAB — MAGNESIUM: Magnesium: 1.9 mg/dL (ref 1.7–2.4)

## 2016-07-03 LAB — PROTIME-INR
INR: 1.13
Prothrombin Time: 14.6 seconds (ref 11.4–15.2)

## 2016-07-03 MED ORDER — WARFARIN SODIUM 7.5 MG PO TABS
7.5000 mg | ORAL_TABLET | Freq: Every day | ORAL | Status: DC
  Administered 2016-07-03: 7.5 mg via ORAL
  Filled 2016-07-03: qty 1

## 2016-07-03 MED ORDER — ISOSORBIDE MONONITRATE ER 30 MG PO TB24
30.0000 mg | ORAL_TABLET | Freq: Every day | ORAL | Status: DC
  Administered 2016-07-03 – 2016-07-04 (×2): 30 mg via ORAL
  Filled 2016-07-03 (×2): qty 1

## 2016-07-03 NOTE — Progress Notes (Signed)
Patient  Watch [facility] video

## 2016-07-03 NOTE — Progress Notes (Signed)
1040-1110 Observed pt up in hall with rolling walker with steady gait. Walked about 600 ft independently. BP 125/82 and sats 97%RA and HR 97. Pt stated he would like rolling walker for home use as it made him feel steadier. Notified case Freight forwarder. Discussed CRP 2 and will refer to [facility] program. Will complete ed tomorrow. Graylon Good RN BSN 07/03/2016 11:09 AM

## 2016-07-03 NOTE — Progress Notes (Signed)
[address]       [employer] [address]             [phone]      4 Days Post-Op Procedure(s) (LRB): MINIMALLY INVASIVE MITRAL VALVE REPAIR  (MVR) (Right) TRANSESOPHAGEAL ECHOCARDIOGRAM (TEE) (N/A) Subjective: Feels well, mild constipation  Objective: Vital signs in last 24 hours: Temp:  [98.7 F (37.1 C)-100 F (37.8 C)] 100 F (37.8 C) (07/02 0427) Pulse Rate:  [71-106] 98 (07/02 0427) Cardiac Rhythm: Heart block (07/02 0710) Resp:  [15-31] 18 (07/02 0427) BP: (116-160)/(80-110) 135/86 (07/02 0427) SpO2:  [97 %-100 %] 100 % (07/02 0427) Weight:  [208 lb 3.2 oz (94.4 kg)-209 lb 3.2 oz (94.9 kg)] 208 lb 3.2 oz (94.4 kg) (07/02 0427)  Hemodynamic parameters for last 24 hours:    Intake/Output from previous day: 07/01 0701 - 07/02 0700 In: 490 [P.O.:400; I.V.:90] Out: 835 [Urine:825; Chest Tube:10] Intake/Output this shift: No intake/output data recorded.  General appearance: alert, cooperative and no distress Heart: regular rate and rhythm and no murmur Lungs: clear to auscultation bilaterally Abdomen: benign Extremities: no edema Wound: incis healing well  Lab Results:  Recent Labs  06/30/16 1709 06/30/16 1716 07/01/16 0401  WBC 12.7*  --  10.8*  HGB 9.8* 9.9* 9.0*  HCT 31.8* 29.0* 29.4*  PLT 206  --  192   BMET:  Recent Labs  06/30/16 1716 07/01/16 0401  NA 140 137  K 4.3 4.1  CL 103 105  CO2  --  28  GLUCOSE 109* 99  BUN 33* 33*  CREATININE 2.00* 2.13*  CALCIUM  --  7.9*    PT/INR:  Recent Labs  07/02/16 0347  LABPROT 14.4  INR 1.11   ABG    Component Value Date/Time   PHART 7.392 06/29/2016 2112   HCO3 27.9 06/29/2016 2112   TCO2 29 06/30/2016 1716   ACIDBASEDEF 1.0 06/07/2016 1317   O2SAT 99.0 06/29/2016 2112   CBG (last 3)   Recent Labs  07/02/16 0735 07/02/16 1155 07/02/16 1601  GLUCAP 90 96 90    Meds Scheduled Meds: . amLODipine  10 mg Oral Daily  . aspirin EC  81 mg Oral Daily  .  carvedilol  12.5 mg Oral BID  . docusate sodium  200 mg Oral Daily  . enoxaparin (LOVENOX) injection  30 mg Subcutaneous Q24H  . febuxostat  80 mg Oral Daily  . folic acid-pyridoxine-cyancobalamin  1 tablet Oral Daily  . hydrALAZINE  100 mg Oral TID  . iron polysaccharides  150 mg Oral Daily  . moving right along book   Does not apply Once  . pantoprazole  40 mg Oral QAC breakfast  . sodium chloride flush  3 mL Intravenous Q12H  . Warfarin - Physician Dosing Inpatient   Does not apply q1800   Continuous Infusions: . sodium chloride     PRN Meds:.sodium chloride, acetaminophen, bisacodyl **OR** bisacodyl, hydrALAZINE, ondansetron **OR** ondansetron (ZOFRAN) IV, oxyCODONE, sodium chloride flush, traMADol  Xrays Dg Chest 2 View  Result Date: 07/03/2016 CLINICAL DATA:  Status post mitral valve replacement. Recent pneumothorax. EXAM: CHEST  2 VIEW COMPARISON:  July 01, 2016 FINDINGS: There has been removal of right chest tube with small right apical and lateral pneumothorax. No tension component. Left jugular catheter removed. There is mild atelectatic change in the right lower lobe. No edema or consolidation. Heart is upper normal in size with pulmonary vascularity within normal limits. Patient is status post mitral  valve replacement. Temporary pacemaker wires are attached to the right heart. No adenopathy. IMPRESSION: Small right apical pneumothorax following chest tube removal on the right. Mild atelectasis right lower lobe. Lungs elsewhere clear. Stable cardiac silhouette. Critical Value/emergent results were called by telephone at the time of interpretation on 07/03/2016 at 7:12 am to Isac Caddy, RN, who verbally acknowledged these results. Electronically Signed   By: Lowella Grip III M.D.   On: 07/03/2016 07:12    Assessment/Plan: S/P Procedure(s) (LRB): MINIMALLY INVASIVE MITRAL VALVE REPAIR  (MVR) (Right) TRANSESOPHAGEAL ECHOCARDIOGRAM (TEE) (N/A)  1 doing well 2 conts to be  hypertensive, will restart imdur at half dose. Hold on ARB for now , recheck creat  3 1 deg HB, monitor on current coreg dose, one short burst of vtach- recheck lytes 4 INR pending 5 CBG's controlled 6 push rehab/pulm toilet   LOS: 4 days    Aster Screws E 07/03/2016

## 2016-07-04 LAB — PROTIME-INR
INR: 1.19
Prothrombin Time: 15.1 seconds (ref 11.4–15.2)

## 2016-07-04 MED ORDER — WARFARIN SODIUM 5 MG PO TABS: 10.0000 mg | ORAL_TABLET | Freq: Every day | ORAL | 1 refills | Status: DC

## 2016-07-04 MED ORDER — CARVEDILOL 12.5 MG PO TABS: 25.0000 mg | ORAL_TABLET | Freq: Two times a day (BID) | ORAL | 1 refills | Status: DC

## 2016-07-04 MED ORDER — WARFARIN SODIUM 10 MG PO TABS: 10.0000 mg | ORAL_TABLET | Freq: Every day | ORAL | Status: DC

## 2016-07-04 MED ORDER — ISOSORBIDE MONONITRATE ER 30 MG PO TB24: 30.0000 mg | ORAL_TABLET | Freq: Every day | ORAL | 1 refills | Status: DC

## 2016-07-04 MED ORDER — OXYCODONE HCL 5 MG PO TABS: 5.0000 mg | ORAL_TABLET | Freq: Four times a day (QID) | ORAL | 0 refills | Status: DC | PRN

## 2016-07-04 MED FILL — Heparin Sodium (Porcine) Inj 1000 Unit/ML: INTRAMUSCULAR | Qty: 2500 | Status: AC

## 2016-07-04 NOTE — Progress Notes (Signed)
CARDIAC REHAB PHASE I   PRE:  Rate/Rhythm: 93 SR  BP:  Sitting: 134/88        SaO2: 97 RA  MODE:  Ambulation: 600 ft   POST:  Rate/Rhythm: 105 ST  BP:  Sitting: 153/100         SaO2: 100 RA  Pt ambulated 600 ft on RA, rolling walker, independent, steady gait, tolerated well with no complaints. Cardiac surgery discharge education completed. Reviewed IS, restrictions, activity progression, exercise, heart healthy diet, sodium restrictions, s/s heart failure, daily weights and phase 2 cardiac rehab. Pt verbalized understanding, receptive to education. Pt states he viewed the cardiac surgery discharge video. Pt agrees to phase 2 cardiac rehab referral sent to [facility]. Pt up ad lib in room.  [phone] Lenna Sciara, RN, BSN 07/04/2016 10:39 AM

## 2016-07-04 NOTE — Progress Notes (Signed)
Pt discharged home with family. IV and telemetry box removed. Pt received discharge instructions and all questions were answered. Pt received 4 paper prescriptions and was instructed to get them filled at his pharmacy. Pt verbalized understanding. Pt discharged with all of his belongings. Pt ambulated off the unit and was accompanied by family.   Grant Fontana BSN, RN

## 2016-07-04 NOTE — Discharge Instructions (Signed)
Mitral Valve Repair/ Replacement, Care After This sheet gives you information about how to care for yourself after your procedure. Your health care provider may also give you more specific instructions. If you have problems or questions, contact your health care provider. What can I expect after the procedure? After the procedure, it is common to have:  Pain at the incision area that may last for several weeks.  Follow these instructions at home: Incision care  Follow instructions from your health care provider about how to take care of your incision. Make sure you: ? Wash your hands with soap and water before you change your bandage (dressing). If soap and water are not available, use hand sanitizer. ? Change your dressing as told by your health care provider. ? Leave stitches (sutures), skin glue, or adhesive strips in place. These skin closures may need to stay in place for 2 weeks or longer. If adhesive strip edges start to loosen and curl up, you may trim the loose edges. Do not remove adhesive strips completely unless your health care provider tells you to do that.  Check your incision area every day for signs of infection. Check for: ? More redness, swelling, or pain. ? More fluid or blood. ? Warmth. ? Pus or a bad smell.  Do not apply powder or lotion to the area. Driving  Do not drive until your health care provider approves.  Do not drive or use heavy machinery while taking prescription pain medicines. Bathing  Do not take baths, swim, or use a hot tub for 2-4 weeks after surgery, or until your health care provider approves. Ask your health care provider if you may take showers.  To wash the incision site, gently wash with soap and water and pat the area dry with a clean towel. Do not rub the incision area. That may cause bleeding. Activity  Rest as told by your health care provider. Ask your health care provider when you can resume normal activities, including sexual  activity.  Avoid the following activities for 6-8 weeks, or as long as directed: ? Lifting anything that is heavier than 10 lb (4.5 kg), or the limit that your health care provider tells you. ? Pushing or pulling things with your arms.  Avoid climbing stairs and using the handrail to pull yourself up for the first 2-3 weeks after surgery.  Avoid airplane travel for 4-6 weeks, or as long as directed.  Avoid sitting for long periods of time and crossing your legs. Get up and move around at least once every 1-2 hours.  If you are taking blood thinners (anticoagulants), avoid activities that have a high risk of injury. Ask your health care provider what activities are safe for you. Lifestyle  Limit alcohol intake to no more than 1 drink a day for nonpregnant women and 2 drinks a day for men. One drink equals 12 oz of beer, 5 oz of wine, or 1 oz of hard liquor.  Do not use any products that contain nicotine or tobacco, such as cigarettes and e-cigarettes. If you need help quitting, ask your health care provider. General instructions  Take your temperature every day and weigh yourself every morning for the first 7 days after surgery. Write your temperatures and weight down and take this record with you to any follow-up visits.  Take over-the-counter and prescription medicines only as told by your health care provider.  To prevent or treat constipation while you are taking prescription pain medicine, your health care  provider may recommend that you: ? Drink enough fluid to keep your urine clear or pale yellow. ? Take over-the-counter or prescription medicines. ? Eat foods that are high in fiber, such as fresh fruits and vegetables, whole grains, and beans. ? Limit foods that are high in fat and processed sugars, such as fried and sweet foods.  Follow instructions from your health care provider about eating or drinking restrictions.  Wear compression stockings for at least 2 weeks, or as  long as told by your health care provider. These stockings help to prevent blood clots and reduce swelling in your legs. If your ankles are swollen after 2 weeks, continue to wear the stockings.  Keep all follow-up visits as told by your health care provider. This is important. Contact a health care provider if:  You develop a skin rash.  Your weight is increasing each day over 2-3 days.  You gain 2 lb (1 kg) or more in a single day.  You have a fever. Get help right away if:  You develop chest pain that feels different from the pain caused by your incision.  You develop shortness of breath or difficulty breathing.  You have more redness, swelling, or pain around your incision.  You have more fluid or blood coming from your incision.  Your incision feels warm to the touch.  You have pus or a bad smell coming from your incision.  You feel light-headed. This information is not intended to replace advice given to you by your health care provider. Make sure you discuss any questions you have with your health care provider. Document Released: 07/08/2004 Document Revised: 10/01/2015 Document Reviewed: 10/01/2015 Elsevier Interactive Patient Education  2018 [employer].     -------------------  Information on my medicine - Coumadin   (Warfarin)  This medication education was reviewed with me or my healthcare representative as part of my discharge preparation.  The pharmacist that spoke with me during my hospital stay was:  Dareen Piano, [facility]  Why was Coumadin prescribed for you? Coumadin was prescribed for you because you have a blood clot or a medical condition that can cause an increased risk of forming blood clots. Blood clots can cause serious health problems by blocking the flow of blood to the heart, lung, or brain. Coumadin can prevent harmful blood clots from forming. As a reminder your indication for Coumadin is:   Blood Clot Prevention After Heart Valve  Surgery  What test will check on my response to Coumadin? While on Coumadin (warfarin) you will need to have an INR test regularly to ensure that your dose is keeping you in the desired range. The INR (international normalized ratio) number is calculated from the result of the laboratory test called prothrombin time (PT).  If an INR APPOINTMENT HAS NOT ALREADY BEEN MADE FOR YOU please schedule an appointment to have this lab work done by your health care provider within 7 days. Your INR goal is usually a number between:  2 to 3 or your provider may give you a more narrow range like 2-2.5.  Ask your health care provider during an office visit what your goal INR is.  What  do you need to  know  About  COUMADIN? Take Coumadin (warfarin) exactly as prescribed by your healthcare provider about the same time each day.  DO NOT stop taking without talking to the doctor who prescribed the medication.  Stopping without other blood clot prevention medication to take the place of Coumadin  may increase your risk of developing a new clot or stroke.  Get refills before you run out.  What do you do if you miss a dose? If you miss a dose, take it as soon as you remember on the same day then continue your regularly scheduled regimen the next day.  Do not take two doses of Coumadin at the same time.  Important Safety Information A possible side effect of Coumadin (Warfarin) is an increased risk of bleeding. You should call your healthcare provider right away if you experience any of the following: ? Bleeding from an injury or your nose that does not stop. ? Unusual colored urine (red or dark brown) or unusual colored stools (red or black). ? Unusual bruising for unknown reasons. ? A serious fall or if you hit your head (even if there is no bleeding).  Some foods or medicines interact with Coumadin (warfarin) and might alter your response to warfarin. To help avoid this: ? Eat a balanced diet, maintaining a  consistent amount of Vitamin K. ? Notify your provider about major diet changes you plan to make. ? Avoid alcohol or limit your intake to 1 drink for women and 2 drinks for men per day. (1 drink is 5 oz. wine, 12 oz. beer, or 1.5 oz. liquor.)  Make sure that ANY health care provider who prescribes medication for you knows that you are taking Coumadin (warfarin).  Also make sure the healthcare provider who is monitoring your Coumadin knows when you have started a new medication including herbals and non-prescription products.  Coumadin (Warfarin)  Major Drug Interactions  Increased Warfarin Effect Decreased Warfarin Effect  Alcohol (large quantities) Antibiotics (esp. Septra/Bactrim, Flagyl, Cipro) Amiodarone (Cordarone) Aspirin (ASA) Cimetidine (Tagamet) Megestrol (Megace) NSAIDs (ibuprofen, naproxen, etc.) Piroxicam (Feldene) Propafenone (Rythmol SR) Propranolol (Inderal) Isoniazid (INH) Posaconazole (Noxafil) Barbiturates (Phenobarbital) Carbamazepine (Tegretol) Chlordiazepoxide (Librium) Cholestyramine (Questran) Griseofulvin Oral Contraceptives Rifampin Sucralfate (Carafate) Vitamin K   Coumadin (Warfarin) Major Herbal Interactions  Increased Warfarin Effect Decreased Warfarin Effect  Garlic Ginseng Ginkgo biloba Coenzyme Q10 Green tea St. Johns wort    Coumadin (Warfarin) FOOD Interactions  Eat a consistent number of servings per week of foods HIGH in Vitamin K (1 serving =  cup)  Collards (cooked, or boiled & drained) Kale (cooked, or boiled & drained) Mustard greens (cooked, or boiled & drained) Parsley *serving size only =  cup Spinach (cooked, or boiled & drained) Swiss chard (cooked, or boiled & drained) Turnip greens (cooked, or boiled & drained)  Eat a consistent number of servings per week of foods MEDIUM-HIGH in Vitamin K (1 serving = 1 cup)  Asparagus (cooked, or boiled & drained) Broccoli (cooked, boiled & drained, or raw & chopped) Brussel  sprouts (cooked, or boiled & drained) *serving size only =  cup Lettuce, raw (green leaf, endive, romaine) Spinach, raw Turnip greens, raw & chopped   These websites have more information on Coumadin (warfarin):  FailFactory.se; VeganReport.com.au;

## 2016-07-04 NOTE — Progress Notes (Addendum)
[address]       [place] [address]             [phone]      5 Days Post-Op Procedure(s) (LRB): MINIMALLY INVASIVE MITRAL VALVE REPAIR  (MVR) (Right) TRANSESOPHAGEAL ECHOCARDIOGRAM (TEE) (N/A) Subjective: Feels well  Objective: Vital signs in last 24 hours: Temp:  [98.8 F (37.1 C)-99 F (37.2 C)] 99 F (37.2 C) (07/03 0458) Pulse Rate:  [81-94] 93 (07/03 0458) Cardiac Rhythm: Heart block (07/03 0704) Resp:  [18] 18 (07/03 0458) BP: (103-140)/(71-99) 140/91 (07/03 0458) SpO2:  [98 %-100 %] 99 % (07/03 0458) Weight:  [205 lb 4.8 oz (93.1 kg)] 205 lb 4.8 oz (93.1 kg) (07/03 0458)  Hemodynamic parameters for last 24 hours:    Intake/Output from previous day: 07/02 0701 - 07/03 0700 In: 600 [P.O.:600] Out: 700 [Urine:700] Intake/Output this shift: No intake/output data recorded.  General appearance: alert, cooperative and no distress Heart: regular rate and rhythm Lungs: clear to auscultation bilaterally Abdomen: benign Extremities: no edema Wound: incis healing well  Lab Results: No results for input(s): WBC, HGB, HCT, PLT in the last 72 hours. BMET:  Recent Labs  07/03/16 0803  NA 138  K 3.9  CL 103  CO2 27  GLUCOSE 124*  BUN 27*  CREATININE 1.83*  CALCIUM 8.4*    PT/INR:  Recent Labs  07/04/16 0302  LABPROT 15.1  INR 1.19   ABG    Component Value Date/Time   PHART 7.392 06/29/2016 2112   HCO3 27.9 06/29/2016 2112   TCO2 29 06/30/2016 1716   ACIDBASEDEF 1.0 06/07/2016 1317   O2SAT 99.0 06/29/2016 2112   CBG (last 3)   Recent Labs  07/02/16 0735 07/02/16 1155 07/02/16 1601  GLUCAP 90 96 90    Meds Scheduled Meds: . amLODipine  10 mg Oral Daily  . aspirin EC  81 mg Oral Daily  . carvedilol  12.5 mg Oral BID  . docusate sodium  200 mg Oral Daily  . enoxaparin (LOVENOX) injection  30 mg Subcutaneous Q24H  . febuxostat  80 mg Oral Daily  . folic acid-pyridoxine-cyancobalamin  1 tablet Oral Daily  .  hydrALAZINE  100 mg Oral TID  . iron polysaccharides  150 mg Oral Daily  . isosorbide mononitrate  30 mg Oral Daily  . moving right along book   Does not apply Once  . pantoprazole  40 mg Oral QAC breakfast  . sodium chloride flush  3 mL Intravenous Q12H  . warfarin  7.5 mg Oral q1800  . Warfarin - Physician Dosing Inpatient   Does not apply q1800   Continuous Infusions: . sodium chloride     PRN Meds:.sodium chloride, acetaminophen, bisacodyl **OR** bisacodyl, hydrALAZINE, ondansetron **OR** ondansetron (ZOFRAN) IV, oxyCODONE, sodium chloride flush, traMADol  Xrays Dg Chest 2 View  Result Date: 07/03/2016 CLINICAL DATA:  Status post mitral valve replacement. Recent pneumothorax. EXAM: CHEST  2 VIEW COMPARISON:  July 01, 2016 FINDINGS: There has been removal of right chest tube with small right apical and lateral pneumothorax. No tension component. Left jugular catheter removed. There is mild atelectatic change in the right lower lobe. No edema or consolidation. Heart is upper normal in size with pulmonary vascularity within normal limits. Patient is status post mitral valve replacement. Temporary pacemaker wires are attached to the right heart. No adenopathy. IMPRESSION: Small right apical pneumothorax following chest tube removal on the right. Mild atelectasis right lower lobe. Lungs elsewhere clear.  Stable cardiac silhouette. Critical Value/emergent results were called by telephone at the time of interpretation on 07/03/2016 at 7:12 am to Isac Caddy, RN, who verbally acknowledged these results. Electronically Signed   By: Lowella Grip III M.D.   On: 07/03/2016 07:12    Assessment/Plan: S/P Procedure(s) (LRB): MINIMALLY INVASIVE MITRAL VALVE REPAIR  (MVR) (Right) TRANSESOPHAGEAL ECHOCARDIOGRAM (TEE) (N/A)  1 doing well 2 d/c pacer wires- if no new issues will d/c later today 3 creat conts to improve- BP reasonable control on current rx 4 10 mg coumadin daily for now     LOS: 5  days    Vernon Blackburn,Vernon Blackburn 07/04/2016 Patient seen and examined, agree with above Home later today Dc on 10 mg coumadin- recheck later this week  Remo Lipps C. Roxan Hockey, MD [facility] [phone]

## 2016-07-04 NOTE — Progress Notes (Signed)
Chest tube sutures removed, per order. Benzoin and steri strips applied. Epicardial pacing wires removed, per order. Sites clean and dry. Pt tolerated well. Vitals being monitored every 15 minutes x1 hour, per protocol. Pt on bedrest for 1 hour. Pt aware. Will continue to monitor.   Grant Fontana BSN, RN

## 2016-07-05 NOTE — Care Management Note (Signed)
Case Management Note Marvetta Gibbons RN, BSN Unit 2W-Case Manager-- [place] [employer] coverage [phone]  Patient Details  Name: Vernon Blackburn MRN: [id number] Date of Birth: 03-02-1965  Subjective/Objective:  Pt admitted s/p mini MVR on 06/29/16                  Action/Plan: PTA pt lived at [place] to follow for d/c needs  Expected Discharge Date:  07/04/16               Expected Discharge Plan:  Home/Self Care  In-House Referral:     Discharge planning Services  CM Consult  Post Acute Care Choice:  Durable Medical Equipment Choice offered to:  Patient  DME Arranged:  [facility]ford Rile rolling DME Agency:  [place]:    [facility] Agency:     Status of Service:  Completed, signed off  If discussed at [place] of Stay Meetings, dates discussed:    Discharge Disposition: home/self care   Additional Comments:  7//18- 1330- Marvetta Gibbons RN, CM- received call from bedside RN- Lauren- pt will be discharged today- has decided he wants RW for home- order has been placed- pt ready to go now- and does not want to wait for RW to be delivered- order for RW printed by bedside RN and given to pt with instructions to go by [facility] store to pick up.   Dawayne Patricia, RN 07/05/2016, 8:33 AM

## 2016-07-07 ENCOUNTER — Telehealth (HOSPITAL_COMMUNITY): Payer: Self-pay

## 2016-07-07 NOTE — Telephone Encounter (Signed)
Patient insurance is active and benefits verified. Patient had Medicaid - no co-pay, no deductible, no out of pocket, no co-insurance, no pre-authorization. Passport/reference [phone].

## 2016-07-12 ENCOUNTER — Telehealth: Payer: Self-pay

## 2016-07-12 NOTE — Telephone Encounter (Signed)
LMOM with instructions to restart Lasix and potassium And TCB with any questions

## 2016-07-12 NOTE — Telephone Encounter (Signed)
-----   Message from [name], MD sent at 07/12/2016  4:33 PM EDT ----- Regarding: RE: edema, WT gain Contact: [phone] Tell him to resume lasix and potassium at previous dose.  He may double up and take them twice daily if needed for a few days.  Call if he doesn't improve.   ----- Message ----- From: Marylen Ponto, LPN Sent: 9/76/7341   3:09 PM To: [name], MD Subject: edema, WT gain                                 Mr Welty calling about bilateral feet and lower legs are swelling. He has had a 1 LBS weight gain since hospital D/C. He denies any dyspnea or cough.And he said he is elevating his legs. He states he was on lasix prior to surgery. Please advise SW

## 2016-07-13 ENCOUNTER — Emergency Department (HOSPITAL_COMMUNITY): Payer: Medicaid Other

## 2016-07-13 ENCOUNTER — Inpatient Hospital Stay (HOSPITAL_COMMUNITY)
Admission: EM | Admit: 2016-07-13 | Discharge: 2016-07-14 | DRG: 176 | Discharge status: Against Medical Advice | Payer: Medicaid Other | Attending: Family Medicine | Admitting: Family Medicine

## 2016-07-13 ENCOUNTER — Encounter (HOSPITAL_COMMUNITY): Payer: Self-pay | Admitting: Emergency Medicine

## 2016-07-13 DIAGNOSIS — R079 Chest pain, unspecified: Secondary | ICD-10-CM | POA: Diagnosis present

## 2016-07-13 DIAGNOSIS — I2699 Other pulmonary embolism without acute cor pulmonale: Secondary | ICD-10-CM

## 2016-07-13 DIAGNOSIS — Z7901 Long term (current) use of anticoagulants: Secondary | ICD-10-CM

## 2016-07-13 DIAGNOSIS — D689 Coagulation defect, unspecified: Secondary | ICD-10-CM | POA: Insufficient documentation

## 2016-07-13 DIAGNOSIS — Z87442 Personal history of urinary calculi: Secondary | ICD-10-CM | POA: Diagnosis not present

## 2016-07-13 DIAGNOSIS — Z9884 Bariatric surgery status: Secondary | ICD-10-CM | POA: Diagnosis not present

## 2016-07-13 DIAGNOSIS — I272 Pulmonary hypertension, unspecified: Secondary | ICD-10-CM | POA: Diagnosis present

## 2016-07-13 DIAGNOSIS — D631 Anemia in chronic kidney disease: Secondary | ICD-10-CM | POA: Diagnosis present

## 2016-07-13 DIAGNOSIS — I130 Hypertensive heart and chronic kidney disease with heart failure and stage 1 through stage 4 chronic kidney disease, or unspecified chronic kidney disease: Secondary | ICD-10-CM | POA: Diagnosis present

## 2016-07-13 DIAGNOSIS — I340 Nonrheumatic mitral (valve) insufficiency: Secondary | ICD-10-CM | POA: Diagnosis present

## 2016-07-13 DIAGNOSIS — R55 Syncope and collapse: Secondary | ICD-10-CM | POA: Diagnosis present

## 2016-07-13 DIAGNOSIS — N184 Chronic kidney disease, stage 4 (severe): Secondary | ICD-10-CM | POA: Diagnosis present

## 2016-07-13 DIAGNOSIS — R0789 Other chest pain: Secondary | ICD-10-CM | POA: Diagnosis not present

## 2016-07-13 DIAGNOSIS — Z7982 Long term (current) use of aspirin: Secondary | ICD-10-CM | POA: Diagnosis not present

## 2016-07-13 DIAGNOSIS — Z952 Presence of prosthetic heart valve: Secondary | ICD-10-CM | POA: Diagnosis not present

## 2016-07-13 DIAGNOSIS — I5022 Chronic systolic (congestive) heart failure: Secondary | ICD-10-CM | POA: Diagnosis present

## 2016-07-13 DIAGNOSIS — K625 Hemorrhage of anus and rectum: Secondary | ICD-10-CM | POA: Diagnosis present

## 2016-07-13 DIAGNOSIS — Z833 Family history of diabetes mellitus: Secondary | ICD-10-CM | POA: Diagnosis not present

## 2016-07-13 DIAGNOSIS — Z9889 Other specified postprocedural states: Secondary | ICD-10-CM

## 2016-07-13 DIAGNOSIS — D649 Anemia, unspecified: Secondary | ICD-10-CM | POA: Diagnosis not present

## 2016-07-13 DIAGNOSIS — Z79899 Other long term (current) drug therapy: Secondary | ICD-10-CM

## 2016-07-13 HISTORY — DX: Syncope and collapse: R55

## 2016-07-13 LAB — I-STAT TROPONIN, ED: Troponin i, poc: 0.07 ng/mL (ref 0.00–0.08)

## 2016-07-13 LAB — CBC
HCT: 25.1 % — ABNORMAL LOW (ref 39.0–52.0)
Hemoglobin: 7.7 g/dL — ABNORMAL LOW (ref 13.0–17.0)
MCH: 24.1 pg — ABNORMAL LOW (ref 26.0–34.0)
MCHC: 30.7 g/dL (ref 30.0–36.0)
MCV: 78.4 fL (ref 78.0–100.0)
Platelets: 555 10*3/uL — ABNORMAL HIGH (ref 150–400)
RBC: 3.2 MIL/uL — ABNORMAL LOW (ref 4.22–5.81)
RDW: 18 % — ABNORMAL HIGH (ref 11.5–15.5)
WBC: 6.7 10*3/uL (ref 4.0–10.5)

## 2016-07-13 LAB — BASIC METABOLIC PANEL
Anion gap: 5 (ref 5–15)
BUN: 22 mg/dL — ABNORMAL HIGH (ref 6–20)
CO2: 26 mmol/L (ref 22–32)
Calcium: 8.2 mg/dL — ABNORMAL LOW (ref 8.9–10.3)
Chloride: 109 mmol/L (ref 101–111)
Creatinine, Ser: 1.96 mg/dL — ABNORMAL HIGH (ref 0.61–1.24)
GFR calc Af Amer: 44 mL/min — ABNORMAL LOW (ref >60)
GFR calc non Af Amer: 38 mL/min — ABNORMAL LOW (ref >60)
Glucose, Bld: 77 mg/dL (ref 65–99)
Potassium: 3.5 mmol/L (ref 3.5–5.1)
Sodium: 140 mmol/L (ref 135–145)

## 2016-07-13 LAB — BRAIN NATRIURETIC PEPTIDE: B Natriuretic Peptide: 714.5 pg/mL — ABNORMAL HIGH (ref 0.0–100.0)

## 2016-07-13 LAB — PROTIME-INR
INR: 7.24
Prothrombin Time: 64.3 seconds — ABNORMAL HIGH (ref 11.4–15.2)

## 2016-07-13 LAB — D-DIMER, QUANTITATIVE: D-Dimer, Quant: 10.03 ug/mL-FEU — ABNORMAL HIGH (ref 0.00–0.50)

## 2016-07-13 LAB — PREPARE RBC (CROSSMATCH)

## 2016-07-13 LAB — TROPONIN I: Troponin I: 0.06 ng/mL (ref <0.03)

## 2016-07-13 MED ORDER — ONDANSETRON HCL 4 MG/2ML IJ SOLN: 4.0000 mg | Freq: Four times a day (QID) | INTRAMUSCULAR | Status: DC | PRN

## 2016-07-13 MED ORDER — LORAZEPAM 2 MG/ML IJ SOLN
1.0000 mg | Freq: Once | INTRAMUSCULAR | Status: AC
  Administered 2016-07-13: 1 mg via INTRAVENOUS
  Filled 2016-07-13: qty 1

## 2016-07-13 MED ORDER — ACETAMINOPHEN 325 MG PO TABS
650.0000 mg | ORAL_TABLET | ORAL | Status: DC | PRN
  Administered 2016-07-13: 650 mg via ORAL
  Filled 2016-07-13: qty 2

## 2016-07-13 MED ORDER — FE FUMARATE-B12-VIT C-FA-IFC PO CAPS
1.0000 | ORAL_CAPSULE | Freq: Every day | ORAL | Status: DC
  Administered 2016-07-13: 1 via ORAL
  Filled 2016-07-13 (×3): qty 1

## 2016-07-13 MED ORDER — SODIUM CHLORIDE 0.9 % IV SOLN
Freq: Once | INTRAVENOUS | Status: AC
  Administered 2016-07-13: 22:00:00 via INTRAVENOUS

## 2016-07-13 MED ORDER — CARVEDILOL 12.5 MG PO TABS
25.0000 mg | ORAL_TABLET | Freq: Two times a day (BID) | ORAL | Status: DC
  Administered 2016-07-13: 25 mg via ORAL
  Filled 2016-07-13: qty 2

## 2016-07-13 MED ORDER — AMLODIPINE BESYLATE 5 MG PO TABS
10.0000 mg | ORAL_TABLET | Freq: Every day | ORAL | Status: DC
  Administered 2016-07-13: 10 mg via ORAL
  Filled 2016-07-13: qty 2

## 2016-07-13 MED ORDER — HYDRALAZINE HCL 20 MG/ML IJ SOLN
10.0000 mg | Freq: Once | INTRAMUSCULAR | Status: AC
  Administered 2016-07-13: 10 mg via INTRAVENOUS
  Filled 2016-07-13: qty 1

## 2016-07-13 MED ORDER — PHYTONADIONE 5 MG PO TABS
10.0000 mg | ORAL_TABLET | Freq: Once | ORAL | Status: AC
  Administered 2016-07-13: 10 mg via ORAL
  Filled 2016-07-13: qty 2

## 2016-07-13 MED ORDER — TECHNETIUM TC 99M DIETHYLENETRIAME-PENTAACETIC ACID: 30.0000 | Freq: Once | INTRAVENOUS | Status: DC | PRN

## 2016-07-13 MED ORDER — ISOSORBIDE MONONITRATE ER 30 MG PO TB24
30.0000 mg | ORAL_TABLET | Freq: Every day | ORAL | Status: DC
  Administered 2016-07-13: 30 mg via ORAL
  Filled 2016-07-13: qty 1

## 2016-07-13 MED ORDER — MORPHINE SULFATE (PF) 4 MG/ML IV SOLN
4.0000 mg | INTRAVENOUS | Status: DC | PRN
  Administered 2016-07-13: 4 mg via INTRAVENOUS
  Filled 2016-07-13: qty 1

## 2016-07-13 MED ORDER — ONDANSETRON HCL 4 MG/2ML IJ SOLN
4.0000 mg | Freq: Once | INTRAMUSCULAR | Status: AC
  Administered 2016-07-13: 4 mg via INTRAVENOUS
  Filled 2016-07-13: qty 2

## 2016-07-13 MED ORDER — FEBUXOSTAT 40 MG PO TABS
80.0000 mg | ORAL_TABLET | Freq: Every day | ORAL | Status: DC
  Administered 2016-07-13: 80 mg via ORAL
  Filled 2016-07-13: qty 2

## 2016-07-13 MED ORDER — TECHNETIUM TO 99M ALBUMIN AGGREGATED
4.0000 | Freq: Once | INTRAVENOUS | Status: AC | PRN
  Administered 2016-07-13: 4 via INTRAVENOUS

## 2016-07-13 MED ORDER — HYDRALAZINE HCL 50 MG PO TABS
100.0000 mg | ORAL_TABLET | Freq: Three times a day (TID) | ORAL | Status: DC
  Administered 2016-07-13: 100 mg via ORAL
  Filled 2016-07-13: qty 2

## 2016-07-13 MED ORDER — ADULT MULTIVITAMIN W/MINERALS CH
1.0000 | ORAL_TABLET | Freq: Every day | ORAL | Status: DC
  Administered 2016-07-13: 1 via ORAL
  Filled 2016-07-13: qty 1

## 2016-07-13 MED ORDER — VITAMIN D 1000 UNITS PO TABS
1000.0000 [IU] | ORAL_TABLET | Freq: Every day | ORAL | Status: DC
  Administered 2016-07-13: 1000 [IU] via ORAL
  Filled 2016-07-13: qty 1

## 2016-07-13 NOTE — ED Notes (Signed)
Attempted to call report

## 2016-07-13 NOTE — ED Notes (Signed)
Care Handoff completed at bedside with Natale Milch, RN.

## 2016-07-13 NOTE — H&P (Signed)
[facility] Admission History and Physical Service Pager: [phone]  Patient name: Vernon Blackburn Medical record number: [id number] Date of birth: 1965/09/12 Age: 51 y.o. Gender: male  Primary Care Provider: [name], MD Consultants: GI in AM Code Status: FULL  Chief Complaint: chest pain  Assessment and Plan: DAELIN HASTE is a 51 y.o. male presenting with chest pain. PMH is significant for severe mitral valve regurg s/p valve annuloplasty in 06/2016, CHF, HTN, L ventricular systolic dysfunction.   Chest pain: HEART score 4. EKG with no acute abnormalities. iStat trop neg at 0.07. Somewhat improved after nitro and ASA administration. Noted to have pulmonary embolus in ED, which is most likely cause of chest pain. Reproducible on exam and patient with recent chest surgery (valve replacement), so MSK etiology on differential. Patient evaluated by his cardiologist Dr. Radford Pax in ED, who felt symptoms unlikely to be of cardiac etiology.  - Admit to [place], attending Dr. McDiarmid - Telemetry - Trend troponins - AM EKG - Holding home ASA and coumadin due to active GI bleed  Pulmonary embolus: Likely etiology of chest pain and SOB. Recent surgery as major risk factor. D-dimer elevated at 10.03, and VQ scan with intermediate probability. RML and RLL ventilation>perfusion deffect. CXR with stable mild R basilar atelectasis or scarring. Lungs CTAB and normal WOB on RA with sats in mid to high 90s. No tachypnea or tachycardia. No signs of DVT on physical exam. Patient currently with supratherapeutic INR so should be sufficiency anticoagulated.  - Holding coumadin given supratherapeutic INR and active GI bleed - Continuous pulse ox - Obtain LE Dopplers to rule out DVT  Supratherapeutic INR: INR 7.24 on admission, increased from 1.19 nine days ago. PT also elevated at 64.3, up from 15.1 nine days ago. Currently on 10mg  coumadin qd.  - Holding coumadin  - AM  PT/INR and PTT - 10 mg PO Vit K for one dose - Plan to start heparin once INR at 3  Anemia: Likely 2/2 GI bleed. Hgb 7.7 on admission, down from 9 on 06/30 and baseline of ~9.5. Guaiac positive in ED. Transfusion threshold 8.0 given cardiac history. Possible contributor to syncopal episode.  - Transfuse 2U PRBC - Post transfusion H&H - Consult GI - appreciate recs - AM CBC  Syncopal episode: Etiology unclear. Orthostasis included as patient standing prior to episode. Large PE could also explain episode, though VQ scan does not suggest PE large enough. Possibly result of anemia, or response to pain he was experiencing at the time. No head trauma or subsequent HA.  - Telemetry - Continue to monitor   CHF: Last echo 06/29/16 with EF 40-45%. Was to resume home Lasix today due to LE edema. With 1-2+ LE edema, R>L.  - Hold Lasix for now - Resume Lasix if develops SOB or signs of pulmonary edema  Severe mitral valve regurgitation: S/p minimally invasive mitral valve annuloplasty by Dr. Roxy Manns on 06/29/16. Follows with Dr. Roxy Manns, Dr. Radford Pax, and [name]. Subsequently started on coumadin.  - Holding coumadin given supratherapeutic INR and active GI bleed  Chronic renal insufficiency: Cr 1.96, which is around patient's baseline. Followed by Dr. [name].  - Hold Lasix - Continue home Ferrex  HTN: BP slightly hypertensive in ED, up to 154/99. On Norvasc, carvedilol, hydralazine, and Imdur at home.  - Continue home meds  Hx gout: Last episode about one month ago.  - Continue home febuxostat  FEN/GI: heart healthy diet Prophylaxis: Holding in setting of  active bleed and supratherapeutic INR  Disposition: admit to FPTS  History of Present Illness:  Vernon Blackburn is a 51 y.o. male presenting with chest pain.   Patient began having chest pain this morning around 0745. He called EMS, who arrived around 45 minutes later. Prior to EMS arrival, patient attempted to walk to the door to open it for  EMS, but syncopized. Is unsure if he hit his head but denies HA or vision changes afterwards. EMS and patient's fiancee arrived at same time and found him on ground. Patient received ASA and nitro per EMS and pain improved somewhat but did not resolve. Patient has continued to have sharp pains intermittently throughout the day, as well as a dull pain on his R chest and back which he attributes to his recent valve replacement procedure.  Patient describes chest pain as sharp, L-sided and radiating to his L arm. Says his arm felt paralyzed during incident. Also reports some radiation of pain to R side of chest. Endorses confusion, SOB, nausea and diaphoresis during event as well. Denies additional symptoms, other than LE swelling which he says is due to not taking Lasix since his recent procedure. He spoke with his cardiologist about this yesterday and was supposed to begin taking Lasix again today, however given the events did not take his medicine today.     Review Of Systems: Per HPI with the following additions:   Review of Systems  Constitutional: Negative for chills and fever.  HENT: Negative for nosebleeds.   Eyes: Negative for blurred vision and double vision.  Respiratory: Positive for shortness of breath.   Cardiovascular: Positive for chest pain and leg swelling. Negative for palpitations.  Gastrointestinal: Positive for nausea. Negative for blood in stool, melena and vomiting.  Genitourinary: Negative for dysuria, frequency, hematuria and urgency.  Musculoskeletal: Positive for back pain.  Neurological: Positive for loss of consciousness. Negative for dizziness and headaches.  Endo/Heme/Allergies: Does not bruise/bleed easily.    Patient Active Problem List   Diagnosis Date Noted  . Chest pain 07/13/2016  . Other chest pain   . Coagulopathy ([place])   . S/P minimally invasive mitral valve repair 06/29/2016  . AKI (acute kidney injury) ([place]) 06/07/2016  . CHF (congestive heart  failure) ([place]) 06/06/2016  . Hypertensive heart and chronic kidney disease with heart failure and stage 1 through stage 4 chronic kidney disease, or chronic kidney disease ([place]) 02/27/2016  . Chronic systolic HF (heart failure) ([place])   . Cardiomyopathy ([place])   . Acute renal failure superimposed on chronic kidney disease ([place])   . Pulmonary HTN ([place])   . Severe mitral regurgitation   . DOE (dyspnea on exertion) 01/02/2016  . CKD (chronic kidney disease)   . Anemia   . Edema 11/15/2011    Past Medical History: Past Medical History:  Diagnosis Date  . Anemia, chronic disease   . Arthritis    Gout  . Asthma   . Cardiomyopathy ([place])   . CHF (congestive heart failure) ([place])    a. 01/2016: echo showing EF of 20-25%, no WMA, severe MR, and PA Peak Pressure of 52 mm Hg.   Marland Kitchen Chronic systolic HF (heart failure) ([place])   . CKD (chronic kidney disease)    Dr [name]  . Elbow pain, left   . Heart murmur   . History of kidney stones   . Knee pain, bilateral   . Left ankle pain 11/04/2010   Gout  . Leg pain, bilateral   .  Peripheral edema    Bilateral lower extremity   . Pneumonia    hx  . Pulmonary HTN ([place])   . S/P minimally invasive mitral valve repair 06/29/2016   30 mm [name] via right mini thoracotomy approach  . Severe mitral regurgitation   . Sleep apnea    hx bariatric  surgery 3 yrs ago lost 166 lbs  . Venous insufficiency     Past Surgical History: Past Surgical History:  Procedure Laterality Date  . GASTRIC BYPASS  2014  . MENISCUS REPAIR Right 05/2008   right  knee  . MITRAL VALVE REPAIR Right 06/29/2016   Procedure: MINIMALLY INVASIVE MITRAL VALVE REPAIR  (MVR);  Surgeon: [name], MD;  Location: [facility];  Service: Open Heart Surgery;  Laterality: Right;  . RIGHT/LEFT HEART CATH AND CORONARY ANGIOGRAPHY N/A 06/07/2016   Procedure: Right/Left Heart Cath and Coronary Angiography;  Surgeon: Larey Dresser, MD;  Location: [facility];   Service: Cardiovascular;  Laterality: N/A;  . TEE WITHOUT CARDIOVERSION N/A 05/04/2016   Procedure: TRANSESOPHAGEAL ECHOCARDIOGRAM (TEE);  Surgeon: Larey Dresser, MD;  Location: [facility] ENDOSCOPY;  Service: Cardiovascular;  Laterality: N/A;  . TEE WITHOUT CARDIOVERSION N/A 06/29/2016   Procedure: TRANSESOPHAGEAL ECHOCARDIOGRAM (TEE);  Surgeon: [name], MD;  Location: [facility];  Service: Open Heart Surgery;  Laterality: N/A;    Social History: Social History  Substance Use Topics  . Smoking status: Never Smoker  . Smokeless tobacco: Never Used  . Alcohol use Yes     Comment: socially   Please also refer to relevant sections of EMR.  Family History: Family History  Problem Relation Age of Onset  . Diabetes Mother   . Hypertension Mother   . Heart failure Mother     Allergies and Medications: Allergies  Allergen Reactions  . No Known Allergies    No current facility-administered medications on file prior to encounter.    Current Outpatient Prescriptions on File Prior to Encounter  Medication Sig Dispense Refill  . amLODipine (NORVASC) 10 MG tablet Take 1 tablet (10 mg total) by mouth daily. 30 tablet 3  . aspirin EC 81 MG EC tablet Take 1 tablet (81 mg total) by mouth daily. 30 tablet 6  . carvedilol (COREG) 12.5 MG tablet Take 2 tablets (25 mg total) by mouth 2 (two) times daily. 60 tablet 1  . cholecalciferol (VITAMIN D) 1000 units tablet Take 1,000 Units by mouth daily.    . FeAspGl-FeFum-B12-FA-C-Succ Ac (FERREX 28 PO) Take 1 tablet by mouth daily.     . Febuxostat (ULORIC) 80 MG TABS Take 80 mg by mouth daily.     . hydrALAZINE (APRESOLINE) 100 MG tablet Take 1 tablet (100 mg total) by mouth 3 (three) times daily. 90 tablet 3  . isosorbide mononitrate (IMDUR) 30 MG 24 hr tablet Take 1 tablet (30 mg total) by mouth daily. 30 tablet 1  . Multiple Vitamin (MULTIVITAMIN WITH MINERALS) TABS tablet Take 1 tablet by mouth daily.    Marland Kitchen oxyCODONE (OXY IR/ROXICODONE) 5 MG immediate  release tablet Take 1-2 tablets (5-10 mg total) by mouth every 6 (six) hours as needed for severe pain. 30 tablet 0  . warfarin (COUMADIN) 5 MG tablet Take 2 tablets (10 mg total) by mouth daily at 6 PM. As directed by the coumadin clinic 100 tablet 1    Objective: BP (!) 139/94   Pulse 81   Temp 99.6 F (37.6 C) (Oral)  Resp 18   Ht 5\' 10"  (1.778 m)   Wt 197 lb (89.4 kg)   SpO2 (P) 100%   BMI 28.27 kg/m  Exam: General: lying in bed in dark, in NAD Eyes: PERRLA, EOMI ENTM: MMM, no oropharyngeal erythema or exudate Neck: supple, no lymphadenopathy Cardiovascular: RRR, no murmurs appreciated Respiratory: CTAB, normal WOB on RA, able to speak in full sentences without difficulty, no wheezes Gastrointestinal: soft, non-tender, non-distended, +BS MSK: 5/5 strength upper and lower extremities bilaterally; 1+ pitting edema on LLE, 1-2+ on RLE, no calf tenderness or palpable cords, Homan's sign neg Derm: incision on R chest minimally tender to palpation but clean and dry Neuro: A&Ox4, CN II-XII grossly intact Psych: flat affect, appropriate mood  Labs and Imaging: CBC BMET   Recent Labs Lab 07/13/16 0951  WBC 6.7  HGB 7.7*  HCT 25.1*  PLT 555*    Recent Labs Lab 07/13/16 0951  NA 140  K 3.5  CL 109  CO2 26  BUN 22*  CREATININE 1.96*  GLUCOSE 77  CALCIUM 8.2*     Dg Chest 2 View  Result Date: 07/13/2016 CLINICAL DATA:  Chest pain. EXAM: CHEST  2 VIEW COMPARISON:  Radiographs of July 03, 2016. FINDINGS: Stable cardiomegaly. No definite pneumothorax is seen currently. Left lung is clear. Stable mild right basilar atelectasis or scarring is noted. No significant pleural effusion is noted. IMPRESSION: No pneumothorax seen currently. Stable mild right basilar atelectasis or scarring. Electronically Signed   By: Marijo Conception, M.D.   On: 07/13/2016 10:35   Nm Pulmonary Perf And Vent  Result Date: 07/13/2016 CLINICAL DATA:  Left-sided chest pain and syncopal episode. EXAM:  NUCLEAR MEDICINE VENTILATION - PERFUSION LUNG SCAN TECHNIQUE: Ventilation images were obtained in multiple projections using inhaled aerosol Tc-35m DTPA. Perfusion images were obtained in multiple projections after intravenous injection of Tc-10m MAA. RADIOPHARMACEUTICALS:  31.1 mCi Technetium-26m DTPA aerosol inhalation and 4.1 mCi Technetium-73m MAA IV COMPARISON:  Chest x-ray 07/13/2016 FINDINGS: Ventilation: Central deposition of the radiopharmaceutical bilaterally. Decreased ventilation noted in the right lower lobe. This appears to correlate with chest x-ray findings of a right lower lobe atelectasis and volume loss. Perfusion: Decreased perfusion noted in the right lower lobe which matches with the ventilation abnormality. No other significant perfusion defects. IMPRESSION: Triple match V/Q scan with right lower and middle lobe perfusion and ventilation abnormalities and chest x-ray abnormality. Although the perfusion abnormality is not as significant as the ventilation abnormality, strictly speaking this is still an intermediate V/Q scan for pulmonary embolism. Electronically Signed   By: Marijo Sanes M.D.   On: 07/13/2016 15:59   Verner Mould, MD 07/13/2016, 10:41 PM PGY-3, [place] [facility] pager: [phone], text pages welcome

## 2016-07-13 NOTE — ED Notes (Signed)
Notified Dr. Jeneen Rinks of elevated INR.

## 2016-07-13 NOTE — ED Provider Notes (Addendum)
[place] DEPT Provider Note   CSN: [id number] Arrival date & time: 07/13/16  7035     History   Chief Complaint Chief Complaint  Patient presents with  . Chest Pain    HPI Vernon Blackburn is a 51 y.o. male.Chief complaint is chest pain, syncope.  HPI 51 year old male. History of severe mitral regurgitation, CHF, hypertension, left ventricular systolic dysfunction. He is status post minimally invasive mitral valve annuloplasty with Dr. Roxy Manns, discharged 6/28. He is on Coumadin. He has been walking daily. He has been symptom-free. He did develop some lower extremity edema and saw his surgeon yesterday. Was directed to restart his Lasix. He was to start this today.  He awakened this morning just an hour or so ago. He had sudden severe sharp pain in his left chest. He felt like he was into his left arm as well. He called 911 as he was home alone. He was trying to get to the door to unlock it so that they could make entry. He had called his significant other as well. On the way to the door he had syncopal episode.  He was awake and alert. EMS and his significant other reach the home at the same time and made injury and he was transferred here.  He has continued pain despite 2 nitroglycerin. Sharp and left-sided. Not short of breath. Symmetric lower cavity swelling.  Past Medical History:  Diagnosis Date  . Anemia, chronic disease   . Arthritis    Gout  . Asthma   . Cardiomyopathy ([place])   . CHF (congestive heart failure) ([place])    a. 01/2016: echo showing EF of 20-25%, no WMA, severe MR, and PA Peak Pressure of 52 mm Hg.   Marland Kitchen Chronic systolic HF (heart failure) ([place])   . CKD (chronic kidney disease)    Dr [name]  . Elbow pain, left   . Heart murmur   . History of kidney stones   . Knee pain, bilateral   . Left ankle pain 11/04/2010   Gout  . Leg pain, bilateral   . Peripheral edema    Bilateral lower extremity   . Pneumonia    hx  . Pulmonary HTN ([place])   . S/P minimally  invasive mitral valve repair 06/29/2016   30 mm [name] via right mini thoracotomy approach  . Severe mitral regurgitation   . Sleep apnea    hx bariatric  surgery 3 yrs ago lost 166 lbs  . Venous insufficiency     Patient Active Problem List   Diagnosis Date Noted  . S/P minimally invasive mitral valve repair 06/29/2016  . AKI (acute kidney injury) ([place]) 06/07/2016  . CHF (congestive heart failure) ([place]) 06/06/2016  . Hypertensive heart and chronic kidney disease with heart failure and stage 1 through stage 4 chronic kidney disease, or chronic kidney disease ([place]) 02/27/2016  . Chronic systolic HF (heart failure) ([place])   . Cardiomyopathy ([place])   . Acute renal failure superimposed on chronic kidney disease ([place])   . Pulmonary HTN ([place])   . Severe mitral regurgitation   . DOE (dyspnea on exertion) 01/02/2016  . CKD (chronic kidney disease)   . Anemia, chronic disease   . Edema 11/15/2011    Past Surgical History:  Procedure Laterality Date  . GASTRIC BYPASS  2014  . MENISCUS REPAIR Right 05/2008   right  knee  . MITRAL VALVE REPAIR Right 06/29/2016   Procedure: MINIMALLY INVASIVE MITRAL VALVE REPAIR  (MVR);  Surgeon: [name], MD;  Location: [facility];  Service: Open Heart Surgery;  Laterality: Right;  . RIGHT/LEFT HEART CATH AND CORONARY ANGIOGRAPHY N/A 06/07/2016   Procedure: Right/Left Heart Cath and Coronary Angiography;  Surgeon: Larey Dresser, MD;  Location: [facility];  Service: Cardiovascular;  Laterality: N/A;  . TEE WITHOUT CARDIOVERSION N/A 05/04/2016   Procedure: TRANSESOPHAGEAL ECHOCARDIOGRAM (TEE);  Surgeon: Larey Dresser, MD;  Location: [facility] ENDOSCOPY;  Service: Cardiovascular;  Laterality: N/A;  . TEE WITHOUT CARDIOVERSION N/A 06/29/2016   Procedure: TRANSESOPHAGEAL ECHOCARDIOGRAM (TEE);  Surgeon: [name], MD;  Location: [facility];  Service: Open Heart Surgery;  Laterality: N/A;       Home Medications    Prior to  Admission medications   Medication Sig Start Date End Date Taking? Authorizing Provider  amLODipine (NORVASC) 10 MG tablet Take 1 tablet (10 mg total) by mouth daily. 06/26/16  Yes Larey Dresser, MD  aspirin EC 81 MG EC tablet Take 1 tablet (81 mg total) by mouth daily. 06/08/16  Yes Shirley Friar, PA-C  carvedilol (COREG) 12.5 MG tablet Take 2 tablets (25 mg total) by mouth 2 (two) times daily. 07/04/16 10/02/16 Yes Gold, Wayne E, PA-C  cholecalciferol (VITAMIN D) 1000 units tablet Take 1,000 Units by mouth daily.   Yes [provider]  FeAspGl-FeFum-B12-FA-C-Succ Ac (FERREX 28 PO) Take 1 tablet by mouth daily.    Yes [provider]  Febuxostat (ULORIC) 80 MG TABS Take 80 mg by mouth daily.    Yes [provider]  hydrALAZINE (APRESOLINE) 100 MG tablet Take 1 tablet (100 mg total) by mouth 3 (three) times daily. 05/30/16  Yes Larey Dresser, MD  isosorbide mononitrate (IMDUR) 30 MG 24 hr tablet Take 1 tablet (30 mg total) by mouth daily. 07/04/16 07/04/17 Yes Gold, Wilder Glade, PA-C  Multiple Vitamin (MULTIVITAMIN WITH MINERALS) TABS tablet Take 1 tablet by mouth daily.   Yes [provider]  oxyCODONE (OXY IR/ROXICODONE) 5 MG immediate release tablet Take 1-2 tablets (5-10 mg total) by mouth every 6 (six) hours as needed for severe pain. 07/04/16  Yes Jadene Pierini E, PA-C  warfarin (COUMADIN) 5 MG tablet Take 2 tablets (10 mg total) by mouth daily at 6 PM. As directed by the coumadin clinic 07/04/16  Yes Gold, Wilder Glade, PA-C    Family History Family History  Problem Relation Age of Onset  . Diabetes Mother   . Hypertension Mother   . Heart failure Mother     Social History Social History  Substance Use Topics  . Smoking status: Never Smoker  . Smokeless tobacco: Never Used  . Alcohol use Yes     Comment: socially     Allergies   No known allergies   Review of Systems Review of Systems  Constitutional: Negative for appetite change, chills,  diaphoresis, fatigue and fever.  HENT: Negative for mouth sores, sore throat and trouble swallowing.   Eyes: Negative for visual disturbance.  Respiratory: Negative for cough, shortness of breath and wheezing.   Cardiovascular: Positive for chest pain.  Gastrointestinal: Negative for abdominal distention, abdominal pain, diarrhea, nausea and vomiting.  Endocrine: Negative for polydipsia, polyphagia and polyuria.  Genitourinary: Negative for dysuria, frequency and hematuria.  Musculoskeletal: Negative for gait problem.  Skin: Negative for color change, pallor and rash.  Neurological: Positive for syncope. Negative for dizziness, light-headedness and headaches.  Hematological: Does not bruise/bleed easily.  Psychiatric/Behavioral: Negative for behavioral problems and confusion.  Physical Exam Updated Vital Signs BP (!) 141/95   Pulse 83   Temp 98.5 F (36.9 C) (Oral)   Resp 20   Ht 5\' 10"  (1.778 m)   Wt 89.4 kg (197 lb)   SpO2 97%   BMI 28.27 kg/m   Physical Exam  Constitutional: He is oriented to person, place, and time. He appears well-developed and well-nourished. No distress.  HENT:  Head: Normocephalic.  Eyes: Pupils are equal, round, and reactive to light. Conjunctivae are normal. No scleral icterus.  Neck: Normal range of motion. Neck supple. No thyromegaly present.  Cardiovascular: Normal rate and regular rhythm.  Exam reveals no gallop and no friction rub.   No murmur heard. Right chest incision appears intact and minimally to nontender. Symmetric bilateral breath sounds.  Pulmonary/Chest: Effort normal and breath sounds normal. No respiratory distress. He has no wheezes. He has no rales.  Clear breath sounds. No wheezes rales or rhonchi. Symmetric 1+ bilateral lower extremity edema without cording pain or swelling  Abdominal: Soft. Bowel sounds are normal. He exhibits no distension. There is no tenderness. There is no rebound.  Musculoskeletal: Normal range of  motion.  Neurological: He is alert and oriented to person, place, and time.  Skin: Skin is warm and dry. No rash noted.  Psychiatric: He has a normal mood and affect. His behavior is normal.     ED Treatments / Results  Labs (all labs ordered are listed, but only abnormal results are displayed) Labs Reviewed  BASIC METABOLIC PANEL - Abnormal; Notable for the following:       Result Value   BUN 22 (*)    Creatinine, Ser 1.96 (*)    Calcium 8.2 (*)    GFR calc non Af Amer 38 (*)    GFR calc Af Amer 44 (*)    All other components within normal limits  CBC - Abnormal; Notable for the following:    RBC 3.20 (*)    Hemoglobin 7.7 (*)    HCT 25.1 (*)    MCH 24.1 (*)    RDW 18.0 (*)    Platelets 555 (*)    All other components within normal limits  PROTIME-INR - Abnormal; Notable for the following:    Prothrombin Time 64.3 (*)    INR 7.24 (*)    All other components within normal limits  D-DIMER, QUANTITATIVE (NOT AT [facility]) - Abnormal; Notable for the following:    D-Dimer, Quant 10.03 (*)    All other components within normal limits  I-STAT TROPOININ, ED    EKG  EKG Interpretation  Date/Time:  Thursday July 13 2016 09:42:23 EDT Ventricular Rate:  80 PR Interval:    QRS Duration: 94 QT Interval:  428 QTC Calculation: 494 R Axis:   64 Text Interpretation:  Sinus rhythm Borderline prolonged QT interval Baseline wander in lead(s) V4 Confirmed by Tanna Furry [phone]) on 07/13/2016 9:57:24 AM       Radiology Dg Chest 2 View  Result Date: 07/13/2016 CLINICAL DATA:  Chest pain. EXAM: CHEST  2 VIEW COMPARISON:  Radiographs of July 03, 2016. FINDINGS: Stable cardiomegaly. No definite pneumothorax is seen currently. Left lung is clear. Stable mild right basilar atelectasis or scarring is noted. No significant pleural effusion is noted. IMPRESSION: No pneumothorax seen currently. Stable mild right basilar atelectasis or scarring. Electronically Signed   By: Marijo Conception, M.D.   On:  07/13/2016 10:35   Nm Pulmonary Perf And Vent  Result Date: 07/13/2016 CLINICAL DATA:  Left-sided chest pain and syncopal episode. EXAM: NUCLEAR MEDICINE VENTILATION - PERFUSION LUNG SCAN TECHNIQUE: Ventilation images were obtained in multiple projections using inhaled aerosol Tc-43m DTPA. Perfusion images were obtained in multiple projections after intravenous injection of Tc-11m MAA. RADIOPHARMACEUTICALS:  31.1 mCi Technetium-45m DTPA aerosol inhalation and 4.1 mCi Technetium-74m MAA IV COMPARISON:  Chest x-ray 07/13/2016 FINDINGS: Ventilation: Central deposition of the radiopharmaceutical bilaterally. Decreased ventilation noted in the right lower lobe. This appears to correlate with chest x-ray findings of a right lower lobe atelectasis and volume loss. Perfusion: Decreased perfusion noted in the right lower lobe which matches with the ventilation abnormality. No other significant perfusion defects. IMPRESSION: Triple match V/Q scan with right lower and middle lobe perfusion and ventilation abnormalities and chest x-ray abnormality. Although the perfusion abnormality is not as significant as the ventilation abnormality, strictly speaking this is still an intermediate V/Q scan for pulmonary embolism. Electronically Signed   By: Marijo Sanes M.D.   On: 07/13/2016 15:59    Procedures Procedures (including critical care time)  Medications Ordered in ED Medications  morphine 4 MG/ML injection 4 mg (4 mg Intravenous Given 07/13/16 1037)  technetium TC 53M diethylenetriame-pentaacetic acid (DTPA) injection 30 millicurie (not administered)  ondansetron (ZOFRAN) injection 4 mg (4 mg Intravenous Given 07/13/16 1037)  hydrALAZINE (APRESOLINE) injection 10 mg (10 mg Intravenous Given 07/13/16 1037)  LORazepam (ATIVAN) injection 1 mg (1 mg Intravenous Given 07/13/16 1314)  technetium albumin aggregated (MAA) injection solution 4 millicurie (4 millicuries Intravenous Contrast Given 07/13/16 1438)     Initial  Impression / Assessment and Plan / ED Course  I have reviewed the triage vital signs and the nursing notes.  Pertinent labs & imaging results that were available during my care of the patient were reviewed by me and considered in my medical decision making (see chart for details).   left-sided chest pain history of CHF and left ventricular dysfunction. Severe MR status post many mitral valve annuloplasty.  Pleuritic chest pain with syncope postoperatively. Differential would include PE. Is on Coumadin. I do not know INR. Cannot perform CT angiogram because of renal function. Will check a d-dimer which still may be elevated secondary to recent procedure. May need V/Q scan. EKG shows no acute abnormalities. Patient reports "normal" heart cath for artery disease. Plan pain control. We'll treat blood pressure. Await chest x-ray and studies.  11:39:  INR supra therapeutic over 7. As recent as 7/3 he was 1.1. He denies bleeding or dark stools. D-dimer elevated at over 10. May still be related to recent procedure. However with left chest pain, syncope, recent surgery still concerned about pulmonary embolus. V/Q requested. We will discuss with cardiology as well as patient has significant cardiac history although historically, no coronary artery disease.  12:51:  Patient informed nursing that he wanted to leave. I discussed with him that his blood is coagulopathic from his Coumadin he would need dosage adjustment. Discussed with him that his hemoglobin is lower than in the hospital. He continues to deny bleeding. He declines rectal exam.  He states that he is anxious. He states he would rather just leave and go to Dr. Theodosia Blender office. I discussed giving him something for his anxiety and giving 30 minutes and sometimes see if he was more willing to stay and he is agreeable to this. We will contact nuclear medicine to see if we can establish timeline for his ventilation perfusion scan. I have spoken with cardiology  regarding consultation as well  15:45: Returned  from V/Q. Results pending. In the interval, discussed with Dr. Radford Pax. She felt that it was unlikely it with previous normal coronaries. Agreed with V/Q scan. Discussed with patient. Dr. Joette Catching recommended blood transfusion and evaluation for anemia. Patient underwent gastric bypass surgery for obesity 3-4 years ago. Recently went to follow-up for this and was found to be anemic prior to his surgical procedure. Had been referred to GI for endoscopy. Did not complete this prior to his surgical procedure and and coagulation. Has never had upper or lower endoscopy.    Obviously if positive PE patient would require admission. He is not supposed to admission for correcting of INR and blood transfusion and consideration for GI testing for occult blood loss. Pts occult blood testing at bedside by myself demonstrated brown stool, Guaiac +.  14:18:  VQ scan is Intermediate probability. Has RML and RLL Ventilation> Perfusion defect. Also both V and Q defects match CXR atelectasis.  Typically, syncope 2/2 PE would require large volume of embolus and RH strain. Unlikely that VQ findings would explain syncope. Pt currently anticoagulated. Concerning that Hb has dropped and Guaiac+ stool.  Final Clinical Impressions(s) / ED Diagnoses   Final diagnoses:  Chest pain  Anemia, unspecified type  Coagulopathy [facility])    New Prescriptions New Prescriptions   No medications on file     Tanna Furry, MD 07/13/16 1252    Tanna Furry, MD 07/13/16 1548    Tanna Furry, MD 07/13/16 [phone]

## 2016-07-13 NOTE — ED Notes (Signed)
Patient currently in [place] for testing.

## 2016-07-14 ENCOUNTER — Encounter (HOSPITAL_COMMUNITY): Payer: Medicaid Other

## 2016-07-14 ENCOUNTER — Telehealth: Payer: Self-pay | Admitting: Cardiology

## 2016-07-14 ENCOUNTER — Other Ambulatory Visit: Payer: Self-pay

## 2016-07-14 ENCOUNTER — Encounter (HOSPITAL_COMMUNITY): Payer: Self-pay | Admitting: Family Medicine

## 2016-07-14 DIAGNOSIS — R55 Syncope and collapse: Secondary | ICD-10-CM

## 2016-07-14 DIAGNOSIS — I2699 Other pulmonary embolism without acute cor pulmonale: Secondary | ICD-10-CM

## 2016-07-14 HISTORY — DX: Syncope and collapse: R55

## 2016-07-14 LAB — BASIC METABOLIC PANEL
Anion gap: 6 (ref 5–15)
BUN: 21 mg/dL — ABNORMAL HIGH (ref 6–20)
CO2: 25 mmol/L (ref 22–32)
Calcium: 8.3 mg/dL — ABNORMAL LOW (ref 8.9–10.3)
Chloride: 108 mmol/L (ref 101–111)
Creatinine, Ser: 1.76 mg/dL — ABNORMAL HIGH (ref 0.61–1.24)
GFR calc Af Amer: 50 mL/min — ABNORMAL LOW (ref >60)
GFR calc non Af Amer: 43 mL/min — ABNORMAL LOW (ref >60)
Glucose, Bld: 82 mg/dL (ref 65–99)
Potassium: 3.9 mmol/L (ref 3.5–5.1)
Sodium: 139 mmol/L (ref 135–145)

## 2016-07-14 LAB — CBC
HCT: 29.6 % — ABNORMAL LOW (ref 39.0–52.0)
Hemoglobin: 9.3 g/dL — ABNORMAL LOW (ref 13.0–17.0)
MCH: 25.3 pg — ABNORMAL LOW (ref 26.0–34.0)
MCHC: 31.4 g/dL (ref 30.0–36.0)
MCV: 80.4 fL (ref 78.0–100.0)
Platelets: 493 10*3/uL — ABNORMAL HIGH (ref 150–400)
RBC: 3.68 MIL/uL — ABNORMAL LOW (ref 4.22–5.81)
RDW: 18 % — ABNORMAL HIGH (ref 11.5–15.5)
WBC: 7.7 10*3/uL (ref 4.0–10.5)

## 2016-07-14 LAB — APTT: aPTT: 67 seconds — ABNORMAL HIGH (ref 24–36)

## 2016-07-14 LAB — PROTIME-INR
INR: 3.63
Prothrombin Time: 37 seconds — ABNORMAL HIGH (ref 11.4–15.2)

## 2016-07-14 LAB — TROPONIN I
Troponin I: 0.05 ng/mL (ref <0.03)
Troponin I: 0.04 ng/mL (ref <0.03)

## 2016-07-14 MED ORDER — NITROGLYCERIN 0.4 MG SL SUBL
SUBLINGUAL_TABLET | SUBLINGUAL | Status: AC
  Filled 2016-07-14: qty 1

## 2016-07-14 MED ORDER — TRAMADOL HCL 50 MG PO TABS
50.0000 mg | ORAL_TABLET | Freq: Four times a day (QID) | ORAL | Status: DC | PRN
  Administered 2016-07-14: 50 mg via ORAL
  Filled 2016-07-14: qty 1

## 2016-07-14 MED ORDER — NITROGLYCERIN 0.4 MG SL SUBL
0.4000 mg | SUBLINGUAL_TABLET | SUBLINGUAL | Status: DC | PRN
  Administered 2016-07-14: 0.4 mg via SUBLINGUAL

## 2016-07-14 NOTE — Progress Notes (Signed)
Pt c/o not having a diet order in, and wants something to eat.  Stating that "food is a basic right," and that he would like to leave AMA.  [place] and physician came to room to discuss Care Plan.  MD told pt that he was NPO bec GI physician might want to do a procedure r/t possible GI bleed.  Pt still wanted to leave AMA.  IV's removed.  Telemetry removed.  Pt able to leave.

## 2016-07-14 NOTE — Discharge Summary (Signed)
[facility] Discharge Summary  Patient name: Vernon Blackburn Medical record number: [id number] Date of birth: 1965-05-05 Age: 51 y.o. Gender: male Date of Admission: 07/13/2016  Date of Discharge: LEFT AMA on 07/14/16 Admitting Physician: Blane Ohara McDiarmid, MD  Primary Care Provider: [name], MD Consultants: None (intended to consult GI however patient left AMA)  Indication for Hospitalization: chest pain, pulmonary embolus, supratherapeutic INR, anemia  Discharge Diagnoses/Problem List:  Patient Active Problem List   Diagnosis Date Noted  . Syncope and collapse 07/14/2016  . Acute pulmonary embolism ([place]) 07/14/2016  . Chest pain 07/13/2016  . Other chest pain   . Coagulopathy ([place])   . S/P minimally invasive mitral valve repair 06/29/2016  . AKI (acute kidney injury) ([place]) 06/07/2016  . CHF (congestive heart failure) ([place]) 06/06/2016  . Hypertensive heart and chronic kidney disease with heart failure and stage 1 through stage 4 chronic kidney disease, or chronic kidney disease ([place]) 02/27/2016  . Chronic systolic HF (heart failure) ([place])   . Cardiomyopathy ([place])   . Acute renal failure superimposed on chronic kidney disease ([place])   . Pulmonary HTN ([place])   . Severe mitral regurgitation   . DOE (dyspnea on exertion) 01/02/2016  . Chronic kidney disease (CKD), stage IV (severe) ([place])   . Anemia   . Edema 11/15/2011     Disposition: LEFT AMA  Discharge Condition: LEFT AMA  Discharge Exam: unable to perform as patient LEFT [facility]  Brief Hospital Course:  Patient presented to ED for chest pain and syncopal episode. Chest pain had improved after nitro and ASA, but had not entirely resolved. Patient also reporting SOB. D-Dimer was obtained which was elevated. Patient with chronic renal insufficiency so CTA unable to be performed, so VQ scan was performed, which showed intermediate probability for PE and RML and RLL ventilation>perfusion defect.  Patient also noted to have INR of 7.2, elevated from INR of 1.19 nine days prior, with anemia to 7.7 down from 9 on 06/30. He was also guaiaic positive. Patient had been started on coumadin on 06/28 after mitral valve annuloplasty for severe mitral valve regurgitation. Coumadin was held and patient admitted for work-up of aforementioned issues.  Patient received PO Vit K and well as 2U PRBC. Hgb subsequently improved to 9.3 after transfusion. GI was to be consulted, however patient left AMA. Risks of leaving AMA were explained in depth to patient by Drs. Reesa Chew and Management consultant (see Dr. Latina Craver note from today's date for detailed account), however patient still choosing to leave AMA. Patient signed AMA papers and subsequently left.     Issues for Follow Up:  1. Patient with presumed active rectal bleed. Recommend GI evaluation.  2. Patient with supratherapeutic INR. Recommend holding coumadin and following closely with his cardiologist for further management.  3. Patient with PE. Likely treated given supratherapeutic INR but would follow up respiratory status.  4. Unable to establish cause of syncopal episode of patient leaving AMA. Would consider further work-up for this as well.  5. LE Dopplers unable to be obtained prior to patient leaving to rule out DVTs.   Significant Procedures: None  Significant Labs and Imaging:   Recent Labs Lab 07/13/16 0951 07/14/16 0555  WBC 6.7 7.7  HGB 7.7* 9.3*  HCT 25.1* 29.6*  PLT 555* 493*    Recent Labs Lab 07/13/16 0951 07/14/16 0555  NA 140 139  K 3.5 3.9  CL 109 108  CO2 26 25  GLUCOSE 77 82  BUN 22* 21*  CREATININE 1.96* 1.76*  CALCIUM 8.2* 8.3*    Results/Tests Pending: None   Follow-Up Appointments: Unable to schedule any follow up appts prior to patient leaving AMA.   Verner Mould, MD 07/14/2016, 9:45 AM PGY-3, [place]

## 2016-07-14 NOTE — Progress Notes (Signed)
Received page from RN that pt would like a diet order and otherwise threatening to leave AMA.  Patient had an NPO order in place in anticipation of a possible GI procedure this morning.   I saw the patient at 8 AM at bedside with his nurse Gabriel Cirri, and discussed with him the risks of leaving against medical advice.  I explained that I strongly would not recommend leaving the hospital due to his supratherapeutic INR and current GI bleed in addition to his PE.  He stated that food was being withheld and that he did not understand why.  I explained this to him.  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     I reiterated that leaving would be against my recommendation and that he needs further workup but could leave if he still wished to as long as he understood these risks.  Patient verbalized understanding and signed AMA forms.

## 2016-07-14 NOTE — Progress Notes (Signed)
Pt began experiencing 9/10 "pulling" chest pain isolated to the right side and shoulder. Pt verbalized he could not tell if this was surgical pain from previous surgery or cardiac related chest pain. Dr. Avon Gully notified and ordered Nitro and Tramadol. Both were administered and now denies any pain. EKG complete reading NSR. Pt's VSS and will continue to monitor.

## 2016-07-14 NOTE — Telephone Encounter (Signed)
New message    Pt is calling for RN. Pt states that he was in the hospital and discharged himself this morning. He would like a call to talk about his hospital stay. Please call.

## 2016-07-14 NOTE — Telephone Encounter (Signed)
Patient states he missed his Coumadin appointment 7/12 because he was in the hospital. He reports he left AMA this morning because he "wasn't happy with customer service via the nurses." He states he knows how serious his condition is but he just had to go. He is concerned about Coumadin and would like to discuss with the Clinic. Rescheduled patient for Monday at 1430. Patient was grateful for assistance.

## 2016-07-15 LAB — BPAM RBC
Blood Product Expiration Date: 201807192359
Blood Product Expiration Date: 201807292359
ISSUE DATE / TIME: 201807122133
ISSUE DATE / TIME: 201807130133
Unit Type and Rh: 5100
Unit Type and Rh: 5100

## 2016-07-15 LAB — TYPE AND SCREEN
ABO/RH(D): O POS
Antibody Screen: NEGATIVE
Unit division: 0
Unit division: 0

## 2016-07-17 ENCOUNTER — Encounter (INDEPENDENT_AMBULATORY_CARE_PROVIDER_SITE_OTHER): Payer: Self-pay

## 2016-07-17 ENCOUNTER — Ambulatory Visit (INDEPENDENT_AMBULATORY_CARE_PROVIDER_SITE_OTHER): Payer: Medicaid Other

## 2016-07-17 ENCOUNTER — Telehealth: Payer: Self-pay

## 2016-07-17 DIAGNOSIS — Z9889 Other specified postprocedural states: Secondary | ICD-10-CM | POA: Diagnosis not present

## 2016-07-17 DIAGNOSIS — I340 Nonrheumatic mitral (valve) insufficiency: Secondary | ICD-10-CM

## 2016-07-17 DIAGNOSIS — I2699 Other pulmonary embolism without acute cor pulmonale: Secondary | ICD-10-CM

## 2016-07-17 DIAGNOSIS — Z7901 Long term (current) use of anticoagulants: Secondary | ICD-10-CM | POA: Diagnosis not present

## 2016-07-17 LAB — POCT INR: INR: 4.7

## 2016-07-17 NOTE — Patient Instructions (Signed)

## 2016-07-17 NOTE — Telephone Encounter (Signed)
Per Dr. Radford Pax, patient needs to have GXT to assess METs for insurance coverage for Cardiac Rehab.   Patient in today for Coumadin appointment. GXT instructions reviewed. Test ordered for scheduling. Patient agrees with treatment plan.

## 2016-07-17 NOTE — Telephone Encounter (Signed)
GXT has been scheduled 8/1.

## 2016-07-21 ENCOUNTER — Emergency Department (HOSPITAL_COMMUNITY)
Admission: EM | Admit: 2016-07-21 | Discharge: 2016-07-21 | Disposition: A | Discharge status: Home / Self Care | Payer: Medicaid Other | Attending: Emergency Medicine | Admitting: Emergency Medicine

## 2016-07-21 ENCOUNTER — Encounter (HOSPITAL_COMMUNITY): Payer: Self-pay | Admitting: Emergency Medicine

## 2016-07-21 ENCOUNTER — Other Ambulatory Visit: Payer: Self-pay | Admitting: Thoracic Surgery (Cardiothoracic Vascular Surgery)

## 2016-07-21 ENCOUNTER — Emergency Department (HOSPITAL_COMMUNITY): Payer: Medicaid Other

## 2016-07-21 ENCOUNTER — Emergency Department (HOSPITAL_BASED_OUTPATIENT_CLINIC_OR_DEPARTMENT_OTHER): Payer: Medicaid Other

## 2016-07-21 DIAGNOSIS — Z79899 Other long term (current) drug therapy: Secondary | ICD-10-CM | POA: Insufficient documentation

## 2016-07-21 DIAGNOSIS — I132 Hypertensive heart and chronic kidney disease with heart failure and with stage 5 chronic kidney disease, or end stage renal disease: Secondary | ICD-10-CM | POA: Insufficient documentation

## 2016-07-21 DIAGNOSIS — R0789 Other chest pain: Secondary | ICD-10-CM | POA: Insufficient documentation

## 2016-07-21 DIAGNOSIS — I2699 Other pulmonary embolism without acute cor pulmonale: Secondary | ICD-10-CM

## 2016-07-21 DIAGNOSIS — J45909 Unspecified asthma, uncomplicated: Secondary | ICD-10-CM | POA: Diagnosis not present

## 2016-07-21 DIAGNOSIS — R791 Abnormal coagulation profile: Secondary | ICD-10-CM | POA: Diagnosis not present

## 2016-07-21 DIAGNOSIS — N185 Chronic kidney disease, stage 5: Secondary | ICD-10-CM | POA: Diagnosis not present

## 2016-07-21 DIAGNOSIS — Z9889 Other specified postprocedural states: Secondary | ICD-10-CM

## 2016-07-21 DIAGNOSIS — Z7901 Long term (current) use of anticoagulants: Secondary | ICD-10-CM | POA: Diagnosis not present

## 2016-07-21 DIAGNOSIS — I360 Nonrheumatic tricuspid (valve) stenosis: Secondary | ICD-10-CM

## 2016-07-21 DIAGNOSIS — Z7982 Long term (current) use of aspirin: Secondary | ICD-10-CM | POA: Insufficient documentation

## 2016-07-21 DIAGNOSIS — I120 Hypertensive chronic kidney disease with stage 5 chronic kidney disease or end stage renal disease: Secondary | ICD-10-CM | POA: Insufficient documentation

## 2016-07-21 DIAGNOSIS — I5022 Chronic systolic (congestive) heart failure: Secondary | ICD-10-CM | POA: Diagnosis not present

## 2016-07-21 DIAGNOSIS — R0781 Pleurodynia: Secondary | ICD-10-CM

## 2016-07-21 LAB — CBC WITH DIFFERENTIAL/PLATELET
Basophils Absolute: 0.1 10*3/uL (ref 0.0–0.1)
Basophils Relative: 1 %
Eosinophils Absolute: 0.3 10*3/uL (ref 0.0–0.7)
Eosinophils Relative: 4 %
HCT: 30 % — ABNORMAL LOW (ref 39.0–52.0)
Hemoglobin: 9.6 g/dL — ABNORMAL LOW (ref 13.0–17.0)
Lymphocytes Relative: 10 %
Lymphs Abs: 0.9 10*3/uL (ref 0.7–4.0)
MCH: 25.6 pg — ABNORMAL LOW (ref 26.0–34.0)
MCHC: 32 g/dL (ref 30.0–36.0)
MCV: 80 fL (ref 78.0–100.0)
Monocytes Absolute: 0.4 10*3/uL (ref 0.1–1.0)
Monocytes Relative: 5 %
Neutro Abs: 7.5 10*3/uL (ref 1.7–7.7)
Neutrophils Relative %: 81 %
Platelets: 383 10*3/uL (ref 150–400)
RBC: 3.75 MIL/uL — ABNORMAL LOW (ref 4.22–5.81)
RDW: 18.5 % — ABNORMAL HIGH (ref 11.5–15.5)
WBC: 9.3 10*3/uL (ref 4.0–10.5)

## 2016-07-21 LAB — BASIC METABOLIC PANEL
Anion gap: 8 (ref 5–15)
BUN: 25 mg/dL — ABNORMAL HIGH (ref 6–20)
CO2: 27 mmol/L (ref 22–32)
Calcium: 8.6 mg/dL — ABNORMAL LOW (ref 8.9–10.3)
Chloride: 103 mmol/L (ref 101–111)
Creatinine, Ser: 1.93 mg/dL — ABNORMAL HIGH (ref 0.61–1.24)
GFR calc Af Amer: 45 mL/min — ABNORMAL LOW (ref >60)
GFR calc non Af Amer: 39 mL/min — ABNORMAL LOW (ref >60)
Glucose, Bld: 109 mg/dL — ABNORMAL HIGH (ref 65–99)
Potassium: 3.3 mmol/L — ABNORMAL LOW (ref 3.5–5.1)
Sodium: 138 mmol/L (ref 135–145)

## 2016-07-21 LAB — ECHOCARDIOGRAM COMPLETE

## 2016-07-21 LAB — PROTIME-INR
INR: 4.78
Prothrombin Time: 44.8 seconds — ABNORMAL HIGH (ref 11.4–15.2)

## 2016-07-21 LAB — BRAIN NATRIURETIC PEPTIDE: B Natriuretic Peptide: 267.9 pg/mL — ABNORMAL HIGH (ref 0.0–100.0)

## 2016-07-21 MED ORDER — ONDANSETRON HCL 4 MG/2ML IJ SOLN
4.0000 mg | INTRAMUSCULAR | Status: AC
  Administered 2016-07-21: 4 mg via INTRAVENOUS
  Filled 2016-07-21: qty 2

## 2016-07-21 MED ORDER — IOPAMIDOL (ISOVUE-370) INJECTION 76%
INTRAVENOUS | Status: AC
  Administered 2016-07-21: 100 mL
  Filled 2016-07-21: qty 100

## 2016-07-21 MED ORDER — OXYCODONE HCL 5 MG PO TABS: 2.5000 mg | ORAL_TABLET | ORAL | 0 refills | Status: DC | PRN

## 2016-07-21 MED ORDER — FENTANYL CITRATE (PF) 100 MCG/2ML IJ SOLN
100.0000 ug | Freq: Once | INTRAMUSCULAR | Status: AC
  Administered 2016-07-21: 100 ug via INTRAVENOUS
  Filled 2016-07-21: qty 2

## 2016-07-21 MED ORDER — ONDANSETRON HCL 4 MG/2ML IJ SOLN
4.0000 mg | Freq: Once | INTRAMUSCULAR | Status: AC
  Administered 2016-07-21: 4 mg via INTRAVENOUS
  Filled 2016-07-21: qty 2

## 2016-07-21 MED ORDER — DIAZEPAM 2 MG PO TABS: 2.0000 mg | ORAL_TABLET | Freq: Four times a day (QID) | ORAL | 0 refills | Status: DC | PRN

## 2016-07-21 MED ORDER — HYDROMORPHONE HCL 1 MG/ML IJ SOLN
0.5000 mg | Freq: Once | INTRAMUSCULAR | Status: AC
  Administered 2016-07-21: 0.5 mg via INTRAVENOUS
  Filled 2016-07-21: qty 0.5

## 2016-07-21 MED ORDER — LORAZEPAM 2 MG/ML IJ SOLN
0.5000 mg | Freq: Once | INTRAMUSCULAR | Status: AC
  Administered 2016-07-21: 0.5 mg via INTRAVENOUS
  Filled 2016-07-21: qty 1

## 2016-07-21 MED ORDER — HYDROMORPHONE HCL 1 MG/ML IJ SOLN: 1.0000 mg | Freq: Once | INTRAMUSCULAR | Status: DC

## 2016-07-21 MED ORDER — HYDROMORPHONE HCL 1 MG/ML IJ SOLN
0.5000 mg | Freq: Once | INTRAMUSCULAR | Status: AC
  Administered 2016-07-21: 0.5 mg via INTRAVENOUS
  Filled 2016-07-21: qty 1

## 2016-07-21 NOTE — ED Triage Notes (Signed)
Pt reports left sided chest pain that began last night, pain worse with inspiration. Pt reports heart valve replacement here at the end of June, states he has been doing well since surgery up until last night.

## 2016-07-21 NOTE — Progress Notes (Signed)
  Echocardiogram 2D Echocardiogram has been performed.  Vernon Blackburn 07/21/2016, 1:38 PM

## 2016-07-21 NOTE — ED Notes (Signed)
Pt called out stating that he's still in pain, and can not breathe. Nikki RN notified.

## 2016-07-21 NOTE — ED Notes (Signed)
Pt continues to c/o SOB. Placed on 3L [place] for comfort. Dr. Leonette Monarch aware

## 2016-07-21 NOTE — ED Notes (Signed)
Pt left before given discharge instructions or Rx's

## 2016-07-21 NOTE — ED Provider Notes (Signed)
Medical screening examination/treatment/procedure(s) were conducted as a shared visit with non-physician practitioner(s) and myself.  I personally evaluated the patient during the encounter. Briefly, the patient is a 51 y.o. male with a history of cardiomyopathy, chronic kidney disease, CHF, valvular deficiency status post minimally invasive mitral valve replacement on 06/29/2016 who presents today with sudden onset of pleuritic chest pain and shortness of breath. Chest x-ray concerning for loculated fluid collection. CTA obtained revealed no evidence of pulmonary embolism or pneumonia but revealed small right pleural effusion without loculation. CT surgery evaluated the patient in the emergency department and cleared for discharge. The patient is safe for discharge with strict return precautions.    EKG Interpretation  Date/Time:  Friday July 21 2016 09:06:44 EDT Ventricular Rate:  77 PR Interval:    QRS Duration: 97 QT Interval:  430 QTC Calculation: 487 R Axis:   -8 Text Interpretation:  Sinus rhythm Borderline prolonged PR interval Nonspecific T abnormalities, lateral leads Borderline prolonged QT interval NO STEMI Otherwise no significant change Confirmed by Addison Lank [phone]) on 07/21/2016 9:43:16 AM           Leonette Monarch Grayce Sessions, MD 07/21/16 [id number]

## 2016-07-21 NOTE — ED Notes (Signed)
[address]       [place] [address]             [phone]           [address]       [place] [address]             [phone]      Vernon Blackburn [facility] Medical Record #[id number] Date of Birth: 09-30-1965  Referring: No ref. provider found Primary Care: Vernon Dress, MD  Chief Complaint:  Chest pain  CARDIOTHORACIC SURGERY OPERATIVE NOTE  Date of Procedure:                06/29/2016  Preoperative Diagnosis:      Severe Mitral Regurgitation  Postoperative Diagnosis:    Same  Procedure:        Minimally-Invasive Mitral Valve Repair             Vernon Blackburn 3D Ring Annuloplasty (size 73mm, catalog # [id number], serial # [id number])               Surgeon:        Vernon Gu. Roxy Manns, MD  Assistant:       Vernon Giovanni, PA-C  Anesthesia:    Vernon Merles, MD  Operative Findings:  Dilated cardiomyopathy with moderate LV systolic dysfunction, EF 69%  Type I mitral valve dysfunction with moderate mitral regurgitation that worsened with provocative volume loading  No residual mitral regurgitation after successful valve repair with ring annuloplasty  History of Present Illness:    The patient is a 51 year old male status post mitral valve repair as described above who was discharged from the hospital on 07/04/2016. Since that time he has felt fairly well but yesterday evening he began to develop worsening chest discomfort. He felt the pain was different from his normal incisional pain. It was associated with some shortness of breath and he also felt anxious. The patient missed his first post operative Coumadin clinic visit that was scheduled for 07/07/2016. When he did go to the Coumadin clinic on 07/13/2016 his INR was quite supratherapeutic at 7.2. His Coumadin dose was decreased but on today's date it was also elevated at 4.7. Chest x-ray was obtained in the emergency department and there was findings  consistent with some fluid in the right chest. There was some concern due to supratherapeutic INR that there could be a developing a hemothorax. We also wanted to rule out any pulmonary embolism. A chest CT scan was obtained and the findings are noted below. Additionally an echocardiogram was obtained and findings supported previous findings of reduced ejection fraction of 25%. however there was no acute findings. The mitral valve repair looked excellent and there was no evidence of any significant regurgitation. Additionally there was no evidence of pericardial effusion.      Past Medical History:  Diagnosis Date  . Anemia, chronic disease   . Arthritis    Gout  . Asthma   . Cardiomyopathy ([facility])   . CHF (congestive heart failure) ([facility])    a. 01/2016: echo showing EF of 20-25%, no WMA, severe MR, and PA Peak Pressure of 52 mm Hg.   Marland Kitchen Chronic systolic HF (heart failure) ([facility])   . CKD (chronic kidney disease)    Dr Vernon Blackburn  . Elbow pain, left   . Heart murmur   . History of kidney stones   . Knee pain, bilateral   .  Left ankle pain 11/04/2010   Gout  . Leg pain, bilateral   . Peripheral edema    Bilateral lower extremity   . Pneumonia    hx  . Pulmonary HTN ([facility])   . S/P minimally invasive mitral valve repair 06/29/2016   30 mm Vernon Blackburn 3D ring annuloplasty via right mini thoracotomy approach  . Severe mitral regurgitation   . Sleep apnea    hx bariatric  surgery 3 yrs ago lost 166 lbs  . Syncope and collapse 07/14/2016  . Venous insufficiency      History  Smoking Status  . Never Smoker  Smokeless Tobacco  . Never Used    History  Alcohol Use  . Yes    Comment: socially     Allergies  Allergen Reactions  . No Known Allergies     No current facility-administered medications for this encounter.    Current Outpatient Prescriptions  Medication Sig Dispense Refill  . amLODipine (NORVASC) 10 MG tablet Take 1 tablet (10 mg total) by mouth daily. 30 tablet 3  . aspirin  EC 81 MG EC tablet Take 1 tablet (81 mg total) by mouth daily. 30 tablet 6  . carvedilol (COREG) 12.5 MG tablet Take 2 tablets (25 mg total) by mouth 2 (two) times daily. 60 tablet 1  . cholecalciferol (VITAMIN D) 1000 units tablet Take 1,000 Units by mouth daily.    . FeAspGl-FeFum-B12-FA-C-Succ Ac (FERREX 28 PO) Take 1 tablet by mouth daily.     . Febuxostat (ULORIC) 80 MG TABS Take 80 mg by mouth daily.     . hydrALAZINE (APRESOLINE) 100 MG tablet Take 1 tablet (100 mg total) by mouth 3 (three) times daily. 90 tablet 3  . isosorbide mononitrate (IMDUR) 30 MG 24 hr tablet Take 1 tablet (30 mg total) by mouth daily. 30 tablet 1  . Multiple Vitamin (MULTIVITAMIN WITH MINERALS) TABS tablet Take 1 tablet by mouth daily.    Marland Kitchen oxyCODONE (OXY IR/ROXICODONE) 5 MG immediate release tablet Take 1-2 tablets (5-10 mg total) by mouth every 6 (six) hours as needed for severe pain. 30 tablet 0  . warfarin (COUMADIN) 5 MG tablet Take 2 tablets (10 mg total) by mouth daily at 6 PM. As directed by the coumadin clinic (Patient taking differently: Take 5 mg by mouth daily at 6 PM. As directed by the coumadin clinic) 100 tablet 1       Physical Exam: BP (!) 131/93   Pulse 74   Temp 98.2 F (36.8 C) (Oral)   Resp 14   SpO2 97%   General appearance: alert, cooperative and no distress Heart: regular rate and rhythm and No murmur Lungs: Diminished in right base Abdomen: soft, non-tender; bowel sounds normal; no masses,  no organomegaly Extremities: extremities normal, atraumatic, no cyanosis or edema Wound: Incision is healing well without evidence of infection   Diagnostic Studies & Laboratory data:     Recent Radiology Findings:   Dg Chest 2 View  Result Date: 07/21/2016 CLINICAL DATA:  Chest pain for 2 days.  Recent valve replacement. EXAM: CHEST  2 VIEW COMPARISON:  07/13/2016 and 07/03/2016 radiographs. FINDINGS: There are lower lung volumes. The heart size and mediastinal contours are stable  status post mitral valve replacement. There is increased atelectasis at both lung bases. An air-fluid level projects over the retrosternal area on the lateral view, likely positioned peripherally in the right pleural space, although not well seen on the frontal examination. No apical pneumothorax or mediastinal shift.  The bones appear unremarkable. IMPRESSION: Suspected small air-fluid level anteriorly in the right pleural space, suspicious for small loculated hydropneumothorax. Consider further evaluation with CT. Increased bibasilar atelectasis. Electronically Signed   By: Richardean Sale M.D.   On: 07/21/2016 10:22   Ct Angio Chest Pe W Or Wo Contrast  Result Date: 07/21/2016 CLINICAL DATA:  Left-sided chest pain since last night. Recent cardiac valvular replacement surgery. EXAM: CT ANGIOGRAPHY CHEST WITH CONTRAST TECHNIQUE: Multidetector CT imaging of the chest was performed using the standard protocol during bolus administration of intravenous contrast. Multiplanar CT image reconstructions and MIPs were obtained to evaluate the vascular anatomy. CONTRAST:  75 cc Isovue 370 COMPARISON:  None. FINDINGS: Cardiovascular: There are no filling defects in the pulmonary arterial tree to suggest acute pulmonary thromboembolism. There is no evidence of aortic dissection or aneurysm. There is no obvious intramural hematoma. Mild atherosclerotic calcification at the aortic arch. Great vessels are poorly opacified. The left ventricle is dilated. Mitral valve annuloplasty ring hardware is in place. Left atrium is normal in size. No pneumothorax. Small right pleural effusion. No left pleural effusion. Mediastinum/Nodes: No abnormal mediastinal adenopathy. There is some soft tissue density anterior to the ascending aorta which is felt to represent postoperative change rather than acute mediastinal hemorrhage. Thyroid and esophagus are unremarkable. Lungs/Pleura: Lungs are very under aerated. Scattered subsegmental  atelectasis is seen throughout the mid and lower lungs. Upper Abdomen: Status post gastric bypass surgery. Liver is unremarkable. Kidneys, spleen, and visualized pancreas are within normal limits. Musculoskeletal: There is no vertebral compression deformity. No evidence of acute rib fracture. There is a healing fracture of the right anterior third rib related to minimally invasive mitral annular repair approach. Review of the MIP images confirms the above findings. IMPRESSION: No evidence of acute pulmonary thromboembolism. Postop changes from mitral ring annuloplasty surgery are noted. The left ventricle remains dilated. Small right pleural effusion. Anterior healing right third rib fracture related to cardiac surgery approach. Status post gastric bypass surgery. Aortic Atherosclerosis (ICD10-I70.0). Electronically Signed   By: Marybelle Killings M.D.   On: 07/21/2016 14:23     Result status: Final result                              *[facility]*                         [address], [place] [address]                            [phone]  ------------------------------------------------------------------- Transthoracic Echocardiography  Patient:    Jaysun, Wessels MR #:       [id number] Study Date: 07/21/2016 Gender:     M Age:        9 Height:     177.8 cm Weight:     90.2 kg BSA:        2.13 m^2 Pt. Status: Room:       [address]    Veryl Speak [id number]  JOACZYSA     YTKZ, [place], [place]  E  PERFORMING   [facility], Inpatient  SONOGRAPHER  B[place]aya [place]  cc:  ------------------------------------------------------------------- LV EF: 25% -   30%  ------------------------------------------------------------------- History:   PMH:  Mitral Valve Disorder.  ------------------------------------------------------------------- Study Conclusions  - Left ventricle: The  cavity size was mildly dilated. There was   mild concentric hypertrophy. Systolic function was severely   reduced. The estimated ejection fraction was in the range of 25%   to 30%. Diffuse hypokinesis. Features are consistent with a   pseudonormal left ventricular filling pattern, with concomitant   abnormal relaxation and increased filling pressure (grade 2   diastolic dysfunction). Doppler parameters are consistent with   elevated ventricular end-diastolic filling pressure. - Ventricular septum: Septal motion showed paradox. The contour   showed diastolic flattening and systolic flattening. - Aortic valve: There was no regurgitation. - Mitral valve: S/p minimally invasive mitral valve repair with a   30 mm annuloplasty ring. There was mild regurgitation. Mean   gradient (D): 4 mm Hg. Valve area by continuity equation (using   LVOT flow): 2.4 cm^2. - Left atrium: The atrium was moderately dilated. - Right ventricle: Systolic function was normal. - Tricuspid valve: There was mild regurgitation. - Pulmonary arteries: Systolic pressure was mildly increased. PA   peak pressure: 37 mm Hg (S). - Inferior vena cava: The vessel was dilated. The respirophasic   diameter changes were blunted (< 50%), consistent with elevated   central venous pressure. - Pericardium, extracardiac: There was no pericardial effusion.  Impressions:  - S/p minimally invasive mitral valve repair with a 30 mm   annuloplasty ring. Normal transmitral gradients.  ------------------------------------------------------------------- Study data:  The previous study was not available, so comparison was made to the report of 04/21/2016.  Study status:  Routine. Procedure:  Transthoracic echocardiography. Image quality was adequate.  Study completion:  There were no complications. Transthoracic echocardiography.  M-mode, complete 2D, spectral Doppler, and color Doppler.  Birthdate:  Patient birthdate: 29-May-1965.  Age:   Patient is 51 yr old.  Sex:  Gender: male. BMI: 28.5 kg/m^2.  Blood pressure:     114/85  Patient status: Inpatient.  Study date:  Study date: 07/21/2016. Study time: 12:48 PM.  Location:  Bedside.  -------------------------------------------------------------------  ------------------------------------------------------------------- Left ventricle:  The cavity size was mildly dilated. There was mild concentric hypertrophy. Systolic function was severely reduced. The estimated ejection fraction was in the range of 25% to 30%. Diffuse hypokinesis. Features are consistent with a pseudonormal left ventricular filling pattern, with concomitant abnormal relaxation and increased filling pressure (grade 2 diastolic dysfunction). Doppler parameters are consistent with elevated ventricular end-diastolic filling pressure.  ------------------------------------------------------------------- Aortic valve:   Trileaflet; normal thickness leaflets. Mobility was not restricted.  Doppler:  Transvalvular velocity was within the normal range. There was no stenosis. There was no regurgitation.   ------------------------------------------------------------------- Aorta:  Aortic root: The aortic root was normal in size.  ------------------------------------------------------------------- Mitral valve:  S/p minimally invasive mitral valve repair with a 30 mm annuloplasty ring. Mobility was not restricted.  Doppler: Transvalvular velocity was within the normal range. There was no evidence for stenosis. There was mild regurgitation.    Valve area by continuity equation (using LVOT flow): 2.4 cm^2. Indexed valve area by continuity equation (using LVOT flow): 1.13 cm^2/m^2. Mean gradient (D): 4 mm Hg.  ------------------------------------------------------------------- Left atrium:  The atrium was moderately dilated.  ------------------------------------------------------------------- Right  ventricle:  The cavity size was normal. Wall thickness was normal. Systolic function was normal.  ------------------------------------------------------------------- Ventricular septum:   Septal  motion showed paradox. The contour showed diastolic flattening and systolic flattening.  ------------------------------------------------------------------- Pulmonic valve:    Structurally normal valve.   Cusp separation was normal.  Doppler:  Transvalvular velocity was within the normal range. There was no evidence for stenosis. There was no regurgitation.  ------------------------------------------------------------------- Tricuspid valve:   Structurally normal valve.    Doppler: Transvalvular velocity was within the normal range. There was mild regurgitation.  ------------------------------------------------------------------- Pulmonary artery:   The main pulmonary artery was normal-sized. Systolic pressure was mildly increased.  ------------------------------------------------------------------- Right atrium:  The atrium was normal in size.  ------------------------------------------------------------------- Pericardium:  There was no pericardial effusion.  ------------------------------------------------------------------- Systemic veins: Inferior vena cava: The vessel was dilated. The respirophasic diameter changes were blunted (< 50%), consistent with elevated central venous pressure.      Recent Lab Findings: Lab Results  Component Value Date   WBC 9.3 07/21/2016   HGB 9.6 (L) 07/21/2016   HCT 30.0 (L) 07/21/2016   PLT 383 07/21/2016   GLUCOSE 109 (H) 07/21/2016   ALT 20 06/27/2016   AST 20 06/27/2016   NA 138 07/21/2016   K 3.3 (L) 07/21/2016   CL 103 07/21/2016   CREATININE 1.93 (H) 07/21/2016   BUN 25 (H) 07/21/2016   CO2 27 07/21/2016   INR 4.78 (HH) 07/21/2016   HGBA1C 5.1 06/27/2016      Assessment / Plan:  The patient has shown some clinical  improvement in the emergency room and is feeling better. It appears that this event was primarily related to incisional pain. He may have a small rib fracture that could also be contributing to the discomfort. He will be discharged from the emergency department and we will see him in the office next Monday, 07/24/2016. Additionally he'll be instructed to not take his Coumadin for the next 2 days and continue long-term follow-up in the Coumadin clinic. We will also give him a prescription for oxycodone.          Cameshia Cressman E, PA-C 07/21/2016 3:41 PM

## 2016-07-21 NOTE — ED Notes (Signed)
Cardiology in to assess pt

## 2016-07-21 NOTE — Discharge Instructions (Signed)
Stop taking your coumadin for the next 2 doses.  Follow up with your coumadin clinic on Monday as you have still not normalized.   Contact a health care provider if: Your symptoms last longer than 5 days. You have pain that does not get better with medicine. Get help right away if: You have severe chest pain that is getting worse. You have difficulty breathing. You have a sudden, severe headache and a stiff neck.

## 2016-07-21 NOTE — ED Provider Notes (Signed)
[facility] DEPT Provider Note   CSN: [id number] Arrival date & time: 07/21/16  0857     History   Chief Complaint Chief Complaint  Patient presents with  . Chest Pain    HPI Vernon Blackburn is a 51 y.o. male with a past medical history of cardiomyopathy, obesity, status post bariatric surgery, chronic kidney disease, status post minimally invasive mitral valve replacement on 06/29/2016. The patient presents today with chief complaint of sudden onset left-sided pleuritic chest pain and shortness of breath. It began last evening. The patient states he has severe pain with inspiration. It is worsened by lying back and he is unable to lie flat at all. It improves when he sits up. He rates the pain at 10 out of 10 and did not have pain medication to take at home. He denies hitting the swelling in his ankles, cough. The patient also has radiation of pain into the left arm with paresthesia. The patient was seen on 07/13/2016 in the for 2 episodes of syncope and pleuritic chest pain. He was found to have a guaiac positive stool and supratherapeutic INR at 7. The patient was admitted after an indeterminate VQ scan, with low probability for pulmonary embolus. The patient left AMA the next day stating what he felt was poor care by the nursing staff. He has been doing well up until last night. Patient did follow up with the Coumadin clinic on Monday of this past week. He was again supratherapeutic at 4.7 on his INR and his Coumadin was withheld until this morning. Patient denies diaphoresis, hemoptysis, fevers or chills. HPI  Past Medical History:  Diagnosis Date  . Anemia, chronic disease   . Arthritis    Gout  . Asthma   . Cardiomyopathy ([place])   . CHF (congestive heart failure) ([place])    a. 01/2016: echo showing EF of 20-25%, no WMA, severe MR, and PA Peak Pressure of 52 mm Hg.   Marland Kitchen Chronic systolic HF (heart failure) ([place])   . CKD (chronic kidney disease)    Dr [name]  . Elbow pain, left   .  Heart murmur   . History of kidney stones   . Knee pain, bilateral   . Left ankle pain 11/04/2010   Gout  . Leg pain, bilateral   . Peripheral edema    Bilateral lower extremity   . Pneumonia    hx  . Pulmonary HTN ([place])   . S/P minimally invasive mitral valve repair 06/29/2016   30 mm [name] via right mini thoracotomy approach  . Severe mitral regurgitation   . Sleep apnea    hx bariatric  surgery 3 yrs ago lost 166 lbs  . Syncope and collapse 07/14/2016  . Venous insufficiency     Patient Active Problem List   Diagnosis Date Noted  . Long term (current) use of anticoagulants [Z79.01] 07/17/2016  . Syncope and collapse 07/14/2016  . Acute pulmonary embolism ([place]) 07/14/2016  . Chest pain 07/13/2016  . Other chest pain   . Coagulopathy ([place])   . S/P minimally invasive mitral valve repair 06/29/2016  . AKI (acute kidney injury) ([place]) 06/07/2016  . CHF (congestive heart failure) ([place]) 06/06/2016  . Hypertensive heart and chronic kidney disease with heart failure and stage 1 through stage 4 chronic kidney disease, or chronic kidney disease ([place]) 02/27/2016  . Chronic systolic HF (heart failure) ([place])   . Cardiomyopathy ([place])   . Acute renal failure superimposed on chronic kidney  disease ([place])   . Pulmonary HTN ([place])   . Severe mitral regurgitation   . DOE (dyspnea on exertion) 01/02/2016  . Chronic kidney disease (CKD), stage IV (severe) ([place])   . Anemia   . Edema 11/15/2011    Past Surgical History:  Procedure Laterality Date  . GASTRIC BYPASS  2014  . MENISCUS REPAIR Right 05/2008   right  knee  . MITRAL VALVE REPAIR Right 06/29/2016   Procedure: MINIMALLY INVASIVE MITRAL VALVE REPAIR  (MVR);  Surgeon: [name], MD;  Location: [facility];  Service: Open Heart Surgery;  Laterality: Right;  . RIGHT/LEFT HEART CATH AND CORONARY ANGIOGRAPHY N/A 06/07/2016   Procedure: Right/Left Heart Cath and Coronary Angiography;  Surgeon: Larey Dresser, MD;   Location: [facility];  Service: Cardiovascular;  Laterality: N/A;  . TEE WITHOUT CARDIOVERSION N/A 05/04/2016   Procedure: TRANSESOPHAGEAL ECHOCARDIOGRAM (TEE);  Surgeon: Larey Dresser, MD;  Location: [facility] ENDOSCOPY;  Service: Cardiovascular;  Laterality: N/A;  . TEE WITHOUT CARDIOVERSION N/A 06/29/2016   Procedure: TRANSESOPHAGEAL ECHOCARDIOGRAM (TEE);  Surgeon: [name], MD;  Location: [facility];  Service: Open Heart Surgery;  Laterality: N/A;       Home Medications    Prior to Admission medications   Medication Sig Start Date End Date Taking? Authorizing Provider  amLODipine (NORVASC) 10 MG tablet Take 1 tablet (10 mg total) by mouth daily. 06/26/16   Larey Dresser, MD  aspirin EC 81 MG EC tablet Take 1 tablet (81 mg total) by mouth daily. 06/08/16   Shirley Friar, PA-C  carvedilol (COREG) 12.5 MG tablet Take 2 tablets (25 mg total) by mouth 2 (two) times daily. 07/04/16 10/02/16  [name], PA-C  cholecalciferol (VITAMIN D) 1000 units tablet Take 1,000 Units by mouth daily.    [provider]  FeAspGl-FeFum-B12-FA-C-Succ Ac (FERREX 28 PO) Take 1 tablet by mouth daily.     [provider]  Febuxostat (ULORIC) 80 MG TABS Take 80 mg by mouth daily.     [provider]  hydrALAZINE (APRESOLINE) 100 MG tablet Take 1 tablet (100 mg total) by mouth 3 (three) times daily. 05/30/16   Larey Dresser, MD  isosorbide mononitrate (IMDUR) 30 MG 24 hr tablet Take 1 tablet (30 mg total) by mouth daily. 07/04/16 07/04/17  [name], PA-C  Multiple Vitamin (MULTIVITAMIN WITH MINERALS) TABS tablet Take 1 tablet by mouth daily.    [provider]  oxyCODONE (OXY IR/ROXICODONE) 5 MG immediate release tablet Take 1-2 tablets (5-10 mg total) by mouth every 6 (six) hours as needed for severe pain. 07/04/16   [name], PA-C  warfarin (COUMADIN) 5 MG tablet Take 2 tablets (10 mg total) by mouth daily at 6 PM. As directed by the coumadin clinic 07/04/16    [name], PA-C    Family History Family History  Problem Relation Age of Onset  . Diabetes Mother   . Hypertension Mother   . Heart failure Mother     Social History Social History  Substance Use Topics  . Smoking status: Never Smoker  . Smokeless tobacco: Never Used  . Alcohol use Yes     Comment: socially     Allergies   No known allergies   Review of Systems Review of Systems Ten systems reviewed and are negative for acute change, except as noted in the HPI.    Physical Exam Updated Vital Signs BP 121/83 (BP Location: Left Arm)  Pulse 77   Temp 98.2 F (36.8 C) (Oral)   Resp 14   SpO2 98%   Physical Exam  Constitutional: He appears well-developed and well-nourished. No distress.  The patient appears uncomfortable and distracted by pain or internal process. He is sitting on the edge of the bed leaning forward. He gives minimal, quiet, with one-word answers  HENT:  Head: Normocephalic and atraumatic.  Eyes: Conjunctivae are normal. No scleral icterus.  Neck: Normal range of motion. Neck supple.  Cardiovascular: Normal rate, regular rhythm and normal heart sounds.   No murmur heard. Pulmonary/Chest: Effort normal and breath sounds normal. No respiratory distress. He exhibits tenderness.  Right sided well-healing surgical scar, no drainage or signs of infection.  Abdominal: Soft. There is no tenderness.  Musculoskeletal: He exhibits no edema.  Neurological: He is alert.  Skin: Skin is warm and dry. He is not diaphoretic.  Psychiatric: His behavior is normal.  Nursing note and vitals reviewed.    ED Treatments / Results  Labs (all labs ordered are listed, but only abnormal results are displayed) Labs Reviewed - No data to display  EKG  EKG Interpretation None       Radiology No results found.  Procedures Procedures (including critical care time)  Medications Ordered in ED Medications - No data to display   Initial Impression /  Assessment and Plan / ED Course  I have reviewed the triage vital signs and the nursing notes.  Pertinent labs & imaging results that were available during my care of the patient were reviewed by me and considered in my medical decision making (see chart for details).  Clinical Course as of Jul 21 1628  Fri Jul 21, 2016  1052 DG Chest 2 View [AH]  1346 INR: (!!) 4.78 [AH]    Clinical Course User Index [AH] Margarita Mail, PA-C    A patient with new loculated fluid collection on chest x-ray. PA Gold from the CT surgery service has consult. Patient had an echocardiogram that showed no worsening heart failure. CT is negative. It also showed just a small effusion on the CT scan. Patient has had poor pain control here, however, CT surgery. He can be discharged home with pain management. He is also supratherapeutic on his INR. I advised the patient to discontinue taking his Coumadin for the next 2 days. The patient, however, eloped before receiving his discharge paperwork as he was very agitated, impatient and did not wish to stay.  Final Clinical Impressions(s) / ED Diagnoses   Final diagnoses:  Pleuritic chest pain  Supratherapeutic INR    New Prescriptions New Prescriptions   No medications on file     Margarita Mail, PA-C 07/21/16 1633

## 2016-07-21 NOTE — ED Notes (Signed)
Pt sitting on bed with leg hanging off. Pt stating he feels sleepy. This tech informed Pt by laying that way he could potentially fall out of the bed and recommend the side rail be placed up. Pt said he understood but refused rail, stating he is more comfortable without rail. RN notified. Wife at bedside.

## 2016-07-24 ENCOUNTER — Encounter (INDEPENDENT_AMBULATORY_CARE_PROVIDER_SITE_OTHER): Payer: Self-pay

## 2016-07-24 ENCOUNTER — Ambulatory Visit
Admission: RE | Admit: 2016-07-24 | Discharge: 2016-07-24 | Disposition: A | Discharge status: Home / Self Care | Payer: Medicaid Other | Source: Ambulatory Visit | Attending: Thoracic Surgery (Cardiothoracic Vascular Surgery) | Admitting: Thoracic Surgery (Cardiothoracic Vascular Surgery)

## 2016-07-24 ENCOUNTER — Encounter: Payer: Self-pay | Admitting: Surgical

## 2016-07-24 ENCOUNTER — Ambulatory Visit: Payer: Medicaid Other | Admitting: Thoracic Surgery (Cardiothoracic Vascular Surgery)

## 2016-07-24 ENCOUNTER — Ambulatory Visit (INDEPENDENT_AMBULATORY_CARE_PROVIDER_SITE_OTHER): Payer: Self-pay | Admitting: Surgical

## 2016-07-24 ENCOUNTER — Ambulatory Visit (INDEPENDENT_AMBULATORY_CARE_PROVIDER_SITE_OTHER): Payer: Medicaid Other

## 2016-07-24 VITALS — BP 133/90 | HR 71 | Resp 16 | Ht 70.0 in | Wt 196.0 lb

## 2016-07-24 DIAGNOSIS — I2699 Other pulmonary embolism without acute cor pulmonale: Secondary | ICD-10-CM | POA: Diagnosis not present

## 2016-07-24 DIAGNOSIS — I340 Nonrheumatic mitral (valve) insufficiency: Secondary | ICD-10-CM

## 2016-07-24 DIAGNOSIS — Z9889 Other specified postprocedural states: Secondary | ICD-10-CM | POA: Diagnosis not present

## 2016-07-24 DIAGNOSIS — Z7901 Long term (current) use of anticoagulants: Secondary | ICD-10-CM | POA: Diagnosis not present

## 2016-07-24 DIAGNOSIS — I341 Nonrheumatic mitral (valve) prolapse: Secondary | ICD-10-CM

## 2016-07-24 LAB — POCT INR: INR: 3.4

## 2016-07-24 NOTE — Progress Notes (Signed)
[address]       [place] [address]             [phone]      Antonino B Soltys [facility] Medical Record #[id number] Date of Birth: 1965/09/14  Referring: Sueanne Margarita, MD Primary Care: [name], MD  Chief Complaint:   POST OP FOLLOW UP  History of Present Illness:    The patient was last seen in the emergency department 3 days ago and since that time he is felt very well. He has had no recurrence of the chest discomfort and denies any significant shortness of breath. He has had no fevers, chills or other constitutional symptoms. Overall he feels like he is back on track and recovering well.      Past Medical History:  Diagnosis Date  . Anemia, chronic disease   . Arthritis    Gout  . Asthma   . Cardiomyopathy ([place])   . CHF (congestive heart failure) ([place])    a. 01/2016: echo showing EF of 20-25%, no WMA, severe MR, and PA Peak Pressure of 52 mm Hg.   Marland Kitchen Chronic systolic HF (heart failure) ([place])   . CKD (chronic kidney disease)    Dr [name]  . Elbow pain, left   . Heart murmur   . History of kidney stones   . Knee pain, bilateral   . Left ankle pain 11/04/2010   Gout  . Leg pain, bilateral   . Peripheral edema    Bilateral lower extremity   . Pneumonia    hx  . Pulmonary HTN ([place])   . S/P minimally invasive mitral valve repair 06/29/2016   30 mm [name] via right mini thoracotomy approach  . Severe mitral regurgitation   . Sleep apnea    hx bariatric  surgery 3 yrs ago lost 166 lbs  . Syncope and collapse 07/14/2016  . Venous insufficiency      History  Smoking Status  . Never Smoker  Smokeless Tobacco  . Never Used    History  Alcohol Use  . Yes    Comment: socially     Allergies  Allergen Reactions  . No Known Allergies     Current Outpatient Prescriptions  Medication Sig Dispense Refill  . amLODipine (NORVASC) 10 MG tablet Take 1 tablet (10 mg total) by mouth daily. 30 tablet 3  .  aspirin EC 81 MG EC tablet Take 1 tablet (81 mg total) by mouth daily. 30 tablet 6  . carvedilol (COREG) 12.5 MG tablet Take 2 tablets (25 mg total) by mouth 2 (two) times daily. 60 tablet 1  . cholecalciferol (VITAMIN D) 1000 units tablet Take 1,000 Units by mouth daily.    . diazepam (VALIUM) 2 MG tablet Take 1 tablet (2 mg total) by mouth every 6 (six) hours as needed for anxiety. 10 tablet 0  . FeAspGl-FeFum-B12-FA-C-Succ Ac (FERREX 28 PO) Take 1 tablet by mouth daily.     . Febuxostat (ULORIC) 80 MG TABS Take 80 mg by mouth daily.     . hydrALAZINE (APRESOLINE) 100 MG tablet Take 1 tablet (100 mg total) by mouth 3 (three) times daily. 90 tablet 3  . isosorbide mononitrate (IMDUR) 30 MG 24 hr tablet Take 1 tablet (30 mg total) by mouth daily. 30 tablet 1  . Multiple Vitamin (MULTIVITAMIN WITH MINERALS) TABS tablet Take 1 tablet by mouth daily.    Marland Kitchen oxyCODONE (ROXICODONE) 5 MG  immediate release tablet Take 0.5-1 tablets (2.5-5 mg total) by mouth every 4 (four) hours as needed for severe pain. 12 tablet 0  . warfarin (COUMADIN) 5 MG tablet Take 2 tablets (10 mg total) by mouth daily at 6 PM. As directed by the coumadin clinic (Patient taking differently: Take 5 mg by mouth daily at 6 PM. As directed by the coumadin clinic) 100 tablet 1   No current facility-administered medications for this visit.        Physical Exam: There were no vitals taken for this visit.  General appearance: alert, cooperative and no distress Heart: regular rate and rhythm Lungs: clear to auscultation bilaterally Abdomen: benign Extremities: no edema Wound: incis healing well   Diagnostic Studies & Laboratory data:     Recent Radiology Findings:   Dg Chest 2 View  Result Date: 07/24/2016 CLINICAL DATA:  Follow-up mitral valve repair on June 28th. History of CHF, heart murmur, pneumonia, pulmonary hypertension. EXAM: CHEST  2 VIEW COMPARISON:  Chest x-ray dated 07/21/2016. FINDINGS: Improved aeration at the  right lung base, presumably decreased pleural effusion. Possible new cavitation within the right lower lung, with associated air-fluid level on the AP view, possibly postsurgical change. Cardiomegaly is stable. Atherosclerotic changes noted at the aortic arch. No acute or suspicious osseous finding. Mild degenerative spurring again noted within the thoracic spine. IMPRESSION: 1. Suspected air-fluid level at the right lung base, possibly indicating underlying cavitation, possibly postsurgical change. Recommend chest CT for further characterization. 2. Overall improved aeration at the right lung base, presumably decreased pleural effusion. 3. Stable cardiomegaly. 4. Aortic atherosclerosis. These results will be called to the ordering clinician or representative by the Radiologist Assistant, and communication documented in the PACS or zVision Dashboard. Electronically Signed   By: Franki Cabot M.D.   On: 07/24/2016 14:22      Recent Lab Findings: Lab Results  Component Value Date   WBC 9.3 07/21/2016   HGB 9.6 (L) 07/21/2016   HCT 30.0 (L) 07/21/2016   PLT 383 07/21/2016   GLUCOSE 109 (H) 07/21/2016   ALT 20 06/27/2016   AST 20 06/27/2016   NA 138 07/21/2016   K 3.3 (L) 07/21/2016   CL 103 07/21/2016   CREATININE 1.93 (H) 07/21/2016   BUN 25 (H) 07/21/2016   CO2 27 07/21/2016   INR 3.4 07/24/2016   HGBA1C 5.1 06/27/2016      Assessment / Plan: The patient is doing quite well in his recovery from mitral valve repair with ring annuloplasty. We will see him again in 2 months with repeat chest x-ray. His x-ray from today is very stable in appearance and correlates quite nicely was previous CT scan done 3 days ago. These findings are not felt to be worrisome at this time as he has no evidence of infectious process going on. Coumadin has been held for 2 days and INR is 3.4 today. He will start on 2.5 milligrams daily which is one half his previous dose tonight.          Dezirea Mccollister E,  PA-C 07/24/2016 2:27 PM

## 2016-07-24 NOTE — Patient Instructions (Addendum)
Continue current plan follow-up in 2 months. May drive

## 2016-07-26 ENCOUNTER — Encounter (HOSPITAL_COMMUNITY): Payer: Self-pay | Admitting: Emergency Medicine

## 2016-07-26 ENCOUNTER — Emergency Department (HOSPITAL_COMMUNITY)
Admission: EM | Admit: 2016-07-26 | Discharge: 2016-07-26 | Disposition: A | Discharge status: Home / Self Care | Payer: Medicaid Other | Attending: Emergency Medicine | Admitting: Emergency Medicine

## 2016-07-26 ENCOUNTER — Other Ambulatory Visit: Payer: Self-pay

## 2016-07-26 ENCOUNTER — Emergency Department (HOSPITAL_COMMUNITY): Payer: Medicaid Other

## 2016-07-26 DIAGNOSIS — Z79899 Other long term (current) drug therapy: Secondary | ICD-10-CM | POA: Diagnosis not present

## 2016-07-26 DIAGNOSIS — R0789 Other chest pain: Secondary | ICD-10-CM

## 2016-07-26 DIAGNOSIS — J45909 Unspecified asthma, uncomplicated: Secondary | ICD-10-CM | POA: Diagnosis not present

## 2016-07-26 DIAGNOSIS — I5022 Chronic systolic (congestive) heart failure: Secondary | ICD-10-CM | POA: Insufficient documentation

## 2016-07-26 DIAGNOSIS — N184 Chronic kidney disease, stage 4 (severe): Secondary | ICD-10-CM | POA: Diagnosis not present

## 2016-07-26 DIAGNOSIS — Z7982 Long term (current) use of aspirin: Secondary | ICD-10-CM | POA: Diagnosis not present

## 2016-07-26 DIAGNOSIS — I130 Hypertensive heart and chronic kidney disease with heart failure and stage 1 through stage 4 chronic kidney disease, or unspecified chronic kidney disease: Secondary | ICD-10-CM | POA: Diagnosis not present

## 2016-07-26 DIAGNOSIS — Z7901 Long term (current) use of anticoagulants: Secondary | ICD-10-CM | POA: Diagnosis not present

## 2016-07-26 LAB — CBC
HCT: 31 % — ABNORMAL LOW (ref 39.0–52.0)
Hemoglobin: 9.8 g/dL — ABNORMAL LOW (ref 13.0–17.0)
MCH: 24.7 pg — ABNORMAL LOW (ref 26.0–34.0)
MCHC: 31.6 g/dL (ref 30.0–36.0)
MCV: 78.1 fL (ref 78.0–100.0)
Platelets: 364 10*3/uL (ref 150–400)
RBC: 3.97 MIL/uL — ABNORMAL LOW (ref 4.22–5.81)
RDW: 17.3 % — ABNORMAL HIGH (ref 11.5–15.5)
WBC: 7.6 10*3/uL (ref 4.0–10.5)

## 2016-07-26 LAB — BASIC METABOLIC PANEL
Anion gap: 8 (ref 5–15)
BUN: 35 mg/dL — ABNORMAL HIGH (ref 6–20)
CO2: 28 mmol/L (ref 22–32)
Calcium: 8.7 mg/dL — ABNORMAL LOW (ref 8.9–10.3)
Chloride: 105 mmol/L (ref 101–111)
Creatinine, Ser: 2.27 mg/dL — ABNORMAL HIGH (ref 0.61–1.24)
GFR calc Af Amer: 37 mL/min — ABNORMAL LOW (ref >60)
GFR calc non Af Amer: 32 mL/min — ABNORMAL LOW (ref >60)
Glucose, Bld: 88 mg/dL (ref 65–99)
Potassium: 3.2 mmol/L — ABNORMAL LOW (ref 3.5–5.1)
Sodium: 141 mmol/L (ref 135–145)

## 2016-07-26 LAB — I-STAT TROPONIN, ED
Troponin i, poc: 0 ng/mL (ref 0.00–0.08)
Troponin i, poc: 0.01 ng/mL (ref 0.00–0.08)

## 2016-07-26 LAB — BRAIN NATRIURETIC PEPTIDE: B Natriuretic Peptide: 303.4 pg/mL — ABNORMAL HIGH (ref 0.0–100.0)

## 2016-07-26 LAB — PROTIME-INR
INR: 2.22
Prothrombin Time: 25 seconds — ABNORMAL HIGH (ref 11.4–15.2)

## 2016-07-26 MED ORDER — MORPHINE SULFATE (PF) 4 MG/ML IV SOLN
4.0000 mg | Freq: Once | INTRAVENOUS | Status: AC
  Administered 2016-07-26: 4 mg via INTRAVENOUS
  Filled 2016-07-26: qty 1

## 2016-07-26 MED ORDER — HYDROCODONE-ACETAMINOPHEN 5-325 MG PO TABS: 1.0000 | ORAL_TABLET | Freq: Four times a day (QID) | ORAL | 0 refills | Status: DC | PRN

## 2016-07-26 NOTE — Discharge Instructions (Signed)
Your heart and lung work-up recently has been reassuring  Please call your primary care doctor for follow-up. Please return for worsening symptoms, including escalating pain, passing out, difficulty breathing, or any other symptoms concerning to you.

## 2016-07-26 NOTE — ED Provider Notes (Signed)
[facility] DEPT Provider Note   CSN: [id number] Arrival date & time: 07/26/16  7124     History   Chief Complaint Chief Complaint  Patient presents with  . Chest Pain    HPI Vernon Blackburn is a 51 y.o. male.  The history is provided by the patient.  Chest Pain   The current episode started more than 2 days ago. The problem occurs constantly. The problem has not changed since onset.The pain is associated with movement, coughing and breathing. The pain is present in the lateral region. The pain is moderate. The quality of the pain is described as sharp. The pain radiates to the upper back. The symptoms are aggravated by certain positions and deep breathing. Associated symptoms include shortness of breath. Pertinent negatives include no cough, no exertional chest pressure, no fever, no leg pain, no lower extremity edema, no sputum production and no syncope. Treatments tried: oxycodone. The treatment provided moderate relief.   51 year old male who presents with chest pain. He has a history of mitral regurgitation status post minimally invasive mitral valve repair on 58/09/9831, chronic systolic and diastolic heart failure with EF of 25-30%, chronic kidney disease. He is currently taking Coumadin. Reports ongoing left-sided chest pain since surgery, for which he was hospitalized in early July and seen in the ED this month. States that pain is sharp in nature, localized to the left anterior chest and radiates towards the back. It is worse when he takes a deep breath in and with positional movements and palpation. He reports associated shortness of breath. States that he was prescribed oxycodone and Valium for this pain, but was told by his family member who is a PA not to take these together. Did try to take oxycodone yesterday, with some relief. No lower extremity edema, fevers, cough.   Past Medical History:  Diagnosis Date  . Anemia, chronic disease   . Arthritis    Gout  . Asthma    . Cardiomyopathy ([place])   . CHF (congestive heart failure) ([place])    a. 01/2016: echo showing EF of 20-25%, no WMA, severe MR, and PA Peak Pressure of 52 mm Hg.   Marland Kitchen Chronic systolic HF (heart failure) ([place])   . CKD (chronic kidney disease)    Dr [name]  . Elbow pain, left   . Heart murmur   . History of kidney stones   . Knee pain, bilateral   . Left ankle pain 11/04/2010   Gout  . Leg pain, bilateral   . Peripheral edema    Bilateral lower extremity   . Pneumonia    hx  . Pulmonary HTN ([place])   . S/P minimally invasive mitral valve repair 06/29/2016   30 mm [name] via right mini thoracotomy approach  . Severe mitral regurgitation   . Sleep apnea    hx bariatric  surgery 3 yrs ago lost 166 lbs  . Syncope and collapse 07/14/2016  . Venous insufficiency     Patient Active Problem List   Diagnosis Date Noted  . Long term (current) use of anticoagulants [Z79.01] 07/17/2016  . Syncope and collapse 07/14/2016  . Acute pulmonary embolism ([place]) 07/14/2016  . Chest pain 07/13/2016  . Other chest pain   . Coagulopathy ([place])   . S/P minimally invasive mitral valve repair 06/29/2016  . AKI (acute kidney injury) ([place]) 06/07/2016  . CHF (congestive heart failure) ([place]) 06/06/2016  . Hypertensive heart and chronic kidney disease with heart failure and stage  1 through stage 4 chronic kidney disease, or chronic kidney disease ([place]) 02/27/2016  . Chronic systolic HF (heart failure) ([place])   . Cardiomyopathy ([place])   . Acute renal failure superimposed on chronic kidney disease ([place])   . Pulmonary HTN ([place])   . Severe mitral regurgitation   . DOE (dyspnea on exertion) 01/02/2016  . Chronic kidney disease (CKD), stage IV (severe) ([place])   . Anemia   . Edema 11/15/2011    Past Surgical History:  Procedure Laterality Date  . GASTRIC BYPASS  2014  . MENISCUS REPAIR Right 05/2008   right  knee  . MITRAL VALVE REPAIR Right 06/29/2016   Procedure: MINIMALLY INVASIVE MITRAL  VALVE REPAIR  (MVR);  Surgeon: [name], MD;  Location: [facility];  Service: Open Heart Surgery;  Laterality: Right;  . RIGHT/LEFT HEART CATH AND CORONARY ANGIOGRAPHY N/A 06/07/2016   Procedure: Right/Left Heart Cath and Coronary Angiography;  Surgeon: Larey Dresser, MD;  Location: [facility];  Service: Cardiovascular;  Laterality: N/A;  . TEE WITHOUT CARDIOVERSION N/A 05/04/2016   Procedure: TRANSESOPHAGEAL ECHOCARDIOGRAM (TEE);  Surgeon: Larey Dresser, MD;  Location[facility] ENDOSCOPY;  Service: Cardiovascular;  Laterality: N/A;  . TEE WITHOUT CARDIOVERSION N/A 06/29/2016   Procedure: TRANSESOPHAGEAL ECHOCARDIOGRAM (TEE);  Surgeon: [name], MD;  Location: [facility];  Service: Open Heart Surgery;  Laterality: N/A;       Home Medications    Prior to Admission medications   Medication Sig Start Date End Date Taking? Authorizing Provider  acetaminophen (TYLENOL) 500 MG tablet Take 1,000 mg by mouth every 6 (six) hours as needed for moderate pain.   Yes [provider]  amLODipine (NORVASC) 10 MG tablet Take 1 tablet (10 mg total) by mouth daily. 06/26/16  Yes Larey Dresser, MD  aspirin EC 81 MG EC tablet Take 1 tablet (81 mg total) by mouth daily. 06/08/16  Yes Shirley Friar, PA-C  carvedilol (COREG) 12.5 MG tablet Take 2 tablets (25 mg total) by mouth 2 (two) times daily. 07/04/16 10/02/16 Yes Gold, Wayne E, PA-C  cholecalciferol (VITAMIN D) 1000 units tablet Take 1,000 Units by mouth daily.   Yes [provider]  FeAspGl-FeFum-B12-FA-C-Succ Ac (FERREX 28 PO) Take 1 tablet by mouth daily.    Yes [provider]  Febuxostat (ULORIC) 80 MG TABS Take 80 mg by mouth daily.    Yes [provider]  hydrALAZINE (APRESOLINE) 100 MG tablet Take 1 tablet (100 mg total) by mouth 3 (three) times daily. 05/30/16  Yes Larey Dresser, MD  isosorbide mononitrate (IMDUR) 30 MG 24 hr tablet Take 1 tablet (30 mg total) by mouth daily. 07/04/16 07/04/17 Yes  Gold, Wilder Glade, PA-C  Multiple Vitamin (MULTIVITAMIN WITH MINERALS) TABS tablet Take 1 tablet by mouth 2 (two) times daily.    Yes [provider]  oxyCODONE (ROXICODONE) 5 MG immediate release tablet Take 0.5-1 tablets (2.5-5 mg total) by mouth every 4 (four) hours as needed for severe pain. Patient taking differently: Take 5 mg by mouth every 4 (four) hours as needed for severe pain.  07/21/16  Yes Margarita Mail, PA-C  warfarin (COUMADIN) 5 MG tablet Take 2 tablets (10 mg total) by mouth daily at 6 PM. As directed by the coumadin clinic Patient taking differently: Take 2.5 mg by mouth daily at 6 PM. As directed by the coumadin clinic 07/04/16  Yes Gold, Wayne E, PA-C  diazepam (VALIUM) 2 MG tablet Take 1 tablet (2 mg  total) by mouth every 6 (six) hours as needed for anxiety. Patient not taking: Reported on 07/26/2016 07/21/16   Margarita Mail, PA-C  HYDROcodone-acetaminophen (NORCO/VICODIN) 5-325 MG tablet Take 1 tablet by mouth every 6 (six) hours as needed. 07/26/16   Forde Dandy, MD    Family History Family History  Problem Relation Age of Onset  . Diabetes Mother   . Hypertension Mother   . Heart failure Mother     Social History Social History  Substance Use Topics  . Smoking status: Never Smoker  . Smokeless tobacco: Never Used  . Alcohol use Yes     Comment: socially     Allergies   No known allergies   Review of Systems Review of Systems  Constitutional: Negative for fever.  Respiratory: Positive for shortness of breath. Negative for cough and sputum production.   Cardiovascular: Positive for chest pain. Negative for leg swelling and syncope.  Hematological: Bruises/bleeds easily.  Psychiatric/Behavioral: Positive for confusion.  All other systems reviewed and are negative.    Physical Exam Updated Vital Signs BP (!) 139/105   Pulse 83   Temp 98.3 F (36.8 C) (Oral)   Resp (!) 21   Ht 5\' 10"  (1.778 m)   Wt 88.5 kg (195 lb)   SpO2 96%   BMI 27.98  kg/m   Physical Exam Physical Exam  Nursing note and vitals reviewed. Constitutional: Well developed, well nourished, non-toxic, and in no acute distress Head: Normocephalic and atraumatic.  Mouth/Throat: Oropharynx is clear and moist.  Neck: Normal range of motion. Neck supple.  Cardiovascular: Normal rate and regular rhythm.   +2 symmetric DP and radial pulses bilaterally Pulmonary/Chest: Effort normal and breath sounds normal.  left anterior chest wall tenderness to palpation Abdominal: Soft. There is no tenderness. There is no rebound and no guarding.  Musculoskeletal: Normal range of motion.  Neurological: Alert, no facial droop, fluent speech, moves all extremities symmetrically Skin: Skin is warm and dry.  Psychiatric: Cooperative   ED Treatments / Results  Labs (all labs ordered are listed, but only abnormal results are displayed) Labs Reviewed  BASIC METABOLIC PANEL - Abnormal; Notable for the following:       Result Value   Potassium 3.2 (*)    BUN 35 (*)    Creatinine, Ser 2.27 (*)    Calcium 8.7 (*)    GFR calc non Af Amer 32 (*)    GFR calc Af Amer 37 (*)    All other components within normal limits  CBC - Abnormal; Notable for the following:    RBC 3.97 (*)    Hemoglobin 9.8 (*)    HCT 31.0 (*)    MCH 24.7 (*)    RDW 17.3 (*)    All other components within normal limits  PROTIME-INR - Abnormal; Notable for the following:    Prothrombin Time 25.0 (*)    All other components within normal limits  BRAIN NATRIURETIC PEPTIDE - Abnormal; Notable for the following:    B Natriuretic Peptide 303.4 (*)    All other components within normal limits  I-STAT TROPONIN, ED  I-STAT TROPONIN, ED    EKG  EKG Interpretation  Date/Time:  Wednesday July 26 2016 09:19:03 EDT Ventricular Rate:  81 PR Interval:  198 QRS Duration: 96 QT Interval:  424 QTC Calculation: 493 R Axis:   24 Text Interpretation:  Sinus rhythm Borderline prolonged QT interval similar to  previous EKG  No acute changes Confirmed by Brantley Stage [phone])  on 07/26/2016 9:25:17 AM       Radiology Dg Chest 2 View  Result Date: 07/26/2016 CLINICAL DATA:  Dyspnea and chest pain.  Weakness for 6 days. EXAM: CHEST  2 VIEW COMPARISON:  07/24/2016 FINDINGS: Curvilinear scarring or atelectasis in the right lung base. Left lung is clear. Unchanged mild cardiomegaly. No pleural effusions. IMPRESSION: Linear scarring or atelectasis in the right lung base. No confluent consolidation. Normal pulmonary vasculature. Unchanged cardiomegaly Electronically Signed   By: Andreas Newport M.D.   On: 07/26/2016 06:59   Dg Chest 2 View  Result Date: 07/24/2016 CLINICAL DATA:  Follow-up mitral valve repair on June 28th. History of CHF, heart murmur, pneumonia, pulmonary hypertension. EXAM: CHEST  2 VIEW COMPARISON:  Chest x-ray dated 07/21/2016. FINDINGS: Improved aeration at the right lung base, presumably decreased pleural effusion. Possible new cavitation within the right lower lung, with associated air-fluid level on the AP view, possibly postsurgical change. Cardiomegaly is stable. Atherosclerotic changes noted at the aortic arch. No acute or suspicious osseous finding. Mild degenerative spurring again noted within the thoracic spine. IMPRESSION: 1. Suspected air-fluid level at the right lung base, possibly indicating underlying cavitation, possibly postsurgical change. Recommend chest CT for further characterization. 2. Overall improved aeration at the right lung base, presumably decreased pleural effusion. 3. Stable cardiomegaly. 4. Aortic atherosclerosis. These results will be called to the ordering clinician or representative by the Radiologist Assistant, and communication documented in the PACS or zVision Dashboard. Electronically Signed   By: Franki Cabot M.D.   On: 07/24/2016 14:22    Procedures Procedures (including critical care time)  Medications Ordered in ED Medications  morphine 4 MG/ML  injection 4 mg (4 mg Intravenous Given 07/26/16 0819)     Initial Impression / Assessment and Plan / ED Course  I have reviewed the triage vital signs and the nursing notes.  Pertinent labs & imaging results that were available during my care of the patient were reviewed by me and considered in my medical decision making (see chart for details).     Records reviewed in [facility]. CT angio chest 5 days ago negative for PE. Showed small right pleural effusion. Seen in ED 7/20, and seen by CT surgery who recommended discharge. Admitted for cardiac rule out 7/12. Seen by Dr. Radford Pax who did not feel like chest pain is cardiac. Seen again in office 2 days ago 7/23 by CT surgery. Patient also had left heart catheterization on 06/07/2016 that showed no significant coronary artery disease.   Given his recent extensive workup, and reassuring catheterization a little over 1 month ago, I do not feel this is consistent with ACS, dissection or PE. His chest pain is atypical, able to be reduced reproduced with palpation and movement. He does not look fluid overloaded, and no vascular congestion or edema on chest x-ray. BNP is close to baseline. No symptoms that are suggestive of CHF exacerbation at this point.  His initial troponin is normal and her EKG does not show acute ischemic changes. Serial EKG without dynamic changes and second troponin is normal. Given previous reassuring workup, he is felt to be stable for discharge home. Patient to follow up closely with PCP, cardiologist, and CT surgery. Strict return and follow-up instructions reviewed. He expressed understanding of all discharge instructions and felt comfortable with the plan of care.   Final Clinical Impressions(s) / ED Diagnoses   Final diagnoses:  Atypical chest pain    New Prescriptions New Prescriptions   HYDROCODONE-ACETAMINOPHEN (  NORCO/VICODIN) 5-325 MG TABLET    Take 1 tablet by mouth every 6 (six) hours as needed.      Forde Dandy, MD 07/26/16 [phone]

## 2016-07-26 NOTE — ED Notes (Signed)
Pt reports chest pain still intermittent sharp pain worse with inspiration

## 2016-07-26 NOTE — ED Triage Notes (Signed)
Pt reports central CP present since 2130 with radiation to neck and back. Sharp in nature, 9/10. Also endorses SOB. Extensive cardiac hx

## 2016-08-02 ENCOUNTER — Ambulatory Visit (INDEPENDENT_AMBULATORY_CARE_PROVIDER_SITE_OTHER): Payer: Medicaid Other

## 2016-08-02 DIAGNOSIS — I340 Nonrheumatic mitral (valve) insufficiency: Secondary | ICD-10-CM

## 2016-08-02 LAB — EXERCISE TOLERANCE TEST
Estimated workload: 4.6 METS
Exercise duration (min): 2 min
Exercise duration (sec): 13 s
MPHR: 170 {beats}/min
Peak HR: 101 {beats}/min
Percent HR: 59 %
RPE: 15
Rest HR: 83 {beats}/min

## 2016-08-17 ENCOUNTER — Telehealth: Payer: Self-pay | Admitting: Cardiology

## 2016-08-17 NOTE — Telephone Encounter (Signed)
Patient calling, states he is available before 3pm today or if it is after 3pm you can call tomorrow. Patient would like to know results of stress test.

## 2016-08-17 NOTE — Telephone Encounter (Signed)
Informed patient of results and verbal understanding expressed.  Patient understands Cardiac Rehab will call to arrange classes after he is evaluated in [facility] (he has not been evaluated since MVR). He was grateful for call.

## 2016-08-17 NOTE — Telephone Encounter (Signed)
-----   Message from Sueanne Margarita, MD sent at 08/03/2016  2:16 PM EDT ----- submax ETT - patient was only able to exercise 2 minutes - please set up for cardiac rehab

## 2016-08-23 ENCOUNTER — Encounter (HOSPITAL_COMMUNITY): Payer: Self-pay | Admitting: Cardiology

## 2016-08-23 ENCOUNTER — Ambulatory Visit (HOSPITAL_COMMUNITY)
Admission: RE | Admit: 2016-08-23 | Discharge: 2016-08-23 | Disposition: A | Discharge status: Home / Self Care | Payer: Medicaid Other | Source: Ambulatory Visit | Attending: Cardiology | Admitting: Cardiology

## 2016-08-23 VITALS — BP 138/95 | HR 76 | Wt 187.0 lb

## 2016-08-23 DIAGNOSIS — Z79891 Long term (current) use of opiate analgesic: Secondary | ICD-10-CM | POA: Diagnosis not present

## 2016-08-23 DIAGNOSIS — Z79899 Other long term (current) drug therapy: Secondary | ICD-10-CM | POA: Insufficient documentation

## 2016-08-23 DIAGNOSIS — I5022 Chronic systolic (congestive) heart failure: Secondary | ICD-10-CM | POA: Diagnosis not present

## 2016-08-23 DIAGNOSIS — I130 Hypertensive heart and chronic kidney disease with heart failure and stage 1 through stage 4 chronic kidney disease, or unspecified chronic kidney disease: Secondary | ICD-10-CM | POA: Diagnosis not present

## 2016-08-23 DIAGNOSIS — N184 Chronic kidney disease, stage 4 (severe): Secondary | ICD-10-CM | POA: Diagnosis not present

## 2016-08-23 DIAGNOSIS — Z9889 Other specified postprocedural states: Secondary | ICD-10-CM | POA: Diagnosis not present

## 2016-08-23 DIAGNOSIS — Z833 Family history of diabetes mellitus: Secondary | ICD-10-CM | POA: Diagnosis not present

## 2016-08-23 DIAGNOSIS — I340 Nonrheumatic mitral (valve) insufficiency: Secondary | ICD-10-CM | POA: Diagnosis not present

## 2016-08-23 DIAGNOSIS — Z8249 Family history of ischemic heart disease and other diseases of the circulatory system: Secondary | ICD-10-CM | POA: Diagnosis not present

## 2016-08-23 DIAGNOSIS — Z7901 Long term (current) use of anticoagulants: Secondary | ICD-10-CM | POA: Diagnosis not present

## 2016-08-23 LAB — CBC
HCT: 30.6 % — ABNORMAL LOW (ref 39.0–52.0)
Hemoglobin: 9.6 g/dL — ABNORMAL LOW (ref 13.0–17.0)
MCH: 24.9 pg — ABNORMAL LOW (ref 26.0–34.0)
MCHC: 31.4 g/dL (ref 30.0–36.0)
MCV: 79.3 fL (ref 78.0–100.0)
Platelets: 283 10*3/uL (ref 150–400)
RBC: 3.86 MIL/uL — ABNORMAL LOW (ref 4.22–5.81)
RDW: 18.5 % — ABNORMAL HIGH (ref 11.5–15.5)
WBC: 6.2 10*3/uL (ref 4.0–10.5)

## 2016-08-23 LAB — BASIC METABOLIC PANEL
Anion gap: 6 (ref 5–15)
BUN: 38 mg/dL — ABNORMAL HIGH (ref 6–20)
CO2: 29 mmol/L (ref 22–32)
Calcium: 8.7 mg/dL — ABNORMAL LOW (ref 8.9–10.3)
Chloride: 107 mmol/L (ref 101–111)
Creatinine, Ser: 2.49 mg/dL — ABNORMAL HIGH (ref 0.61–1.24)
GFR calc Af Amer: 33 mL/min — ABNORMAL LOW (ref >60)
GFR calc non Af Amer: 29 mL/min — ABNORMAL LOW (ref >60)
Glucose, Bld: 76 mg/dL (ref 65–99)
Potassium: 3.2 mmol/L — ABNORMAL LOW (ref 3.5–5.1)
Sodium: 142 mmol/L (ref 135–145)

## 2016-08-23 MED ORDER — FUROSEMIDE 40 MG PO TABS: 40.0000 mg | ORAL_TABLET | Freq: Two times a day (BID) | ORAL | 3 refills | Status: DC

## 2016-08-23 MED ORDER — ISOSORBIDE MONONITRATE ER 60 MG PO TB24: 60.0000 mg | ORAL_TABLET | Freq: Every day | ORAL | 3 refills | Status: DC

## 2016-08-23 NOTE — Patient Instructions (Addendum)
Labs today (will call for abnormal results, otherwise no news is good news)  STOP taking Aspirin.  Restart it daily once you stop warfarin.   You have been referred to Cardiac rehab, they will contact you to schedule appointment.   Your follow up appointment is scheduled for Monday November 26th @ 3:00pm

## 2016-08-24 NOTE — Progress Notes (Signed)
PCP: Dr. Delena Bali HF Cardiology: [name]  51 yo with history of long-standing, poorly-controlled HTN, CKD stage 3-4, chronic systolic CHF and severe mitral regurgitation.  He has had hypertension for years.  He has had known CKD, followed by Dr. [name].  He developed severe dyspnea in 12/17 and was admitted with acute systolic CHF.  Echo showed EF 20-25% with severe MR.  He was diuresed and discharged.   TEE was done in 5/18 to assess mitral valve.  There was severe MR.  The MV was thickened and did not coapt well.  Suspect there is a component of secondary MR with moderate LV dilation, however the thickened leaflets suggested a component of primary MR as well.   He had right and left heart cath in 6/18.  No significant CAD, elevated filling pressures.    In 6/18, he had mitral valve repair.  Post-op echo in 7/18 showed EF 25-30% with stable MV repair (mild MR, mean gradient 4 mmHg).    He has been doing well post-op.  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 He says that his breathing is a lot better since surgery.  No significant exertional dyspnea walking on flat ground (can walk 3 times around track at [facility] before resting). He can climb a flight of steps.  No chest pain.  No lightheadedness.  No palpitations.  Weight is down 10 lbs.  No orthopnea/PND.   Labs (4/18): K 3.7, creatinine 3.05, hgb 10.4 Labs (5/18): K 3.3, creatinine 2.89, BNP 789 Labs (6/18): K 3.4, creatinine 2.4, hgb 10.1 Labs (7/18): K 3.2, creatinine 2.27, BNP 303  PMH: 1. Gout 2. HTN: Long-standing, poor control. 3. CKD: Stage 3-4, followed by Dr. [name].  Likely hypertensive nephropathy.  4. Dilated cardiomyopathy: May be due to long-standing HTN.  - Echo (1/18): EF 20-25%, severe MR - Echo (2/18): EF 25%, severe MR.  - TEE (5/18): Moderate LV dilation with severe global hypokinesis, EF 25-30%, normal RV size with mildly decreased systolic function, RV-RA  gradient 55 mmHg, severe MR with mildly thickened leaflets with inadequate coaptation and ERO 0.52 cm^2 by PISA and systolic flow reversal in the pulmonary vein doppler pattern => secondary MR from dilated LV but thickened leaflets lead to concern for component of primary MR as well.  - RHC/LHC (6/18): No significant CAD.  Mean RA 15, PA 76/31 mean 48, mean PCWP 45, CI 3.02 Fick, 2.64 thermo.  - Echo (7/18): EF 25-30%, mild dilation, diffuse hypokinesis, s/p MV repair with mean gradient 4 mmHg and mild MR, PASP 37 mmHg, normal RV size and systolic function.  5. Severe MR: See TEE report above.  - S/p MV repair in 6/18.  SH: Married, lives in [place], nonsmoker, no drugs, occasional ETOH.  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   Family History  Problem Relation Age of Onset  . Diabetes Mother   . Hypertension Mother   . Heart failure Mother    ROS: All systems reviewed and negative except as per HPI.   Current Outpatient Prescriptions  Medication Sig Dispense Refill  . acetaminophen (TYLENOL) 500 MG tablet Take 1,000 mg by mouth every 6 (six) hours as needed for moderate pain.    Marland Kitchen amLODipine (NORVASC) 10 MG tablet Take 1 tablet (10 mg total) by mouth daily. 30 tablet 3  . carvedilol (COREG) 12.5 MG tablet Take 2 tablets (25 mg total) by mouth 2 (two) times daily. [place]  tablet 1  . cholecalciferol (VITAMIN D) 1000 units tablet Take 1,000 Units by mouth daily.    . FeAspGl-FeFum-B12-FA-C-Succ Ac (FERREX 28 PO) Take 1 tablet by mouth daily.     . Febuxostat (ULORIC) 80 MG TABS Take 80 mg by mouth daily.     . hydrALAZINE (APRESOLINE) 100 MG tablet Take 1 tablet (100 mg total) by mouth 3 (three) times daily. 90 tablet 3  . HYDROcodone-acetaminophen (NORCO/VICODIN) 5-325 MG tablet Take 1 tablet by mouth every 6 (six) hours as needed. 6 tablet 0  . isosorbide mononitrate (IMDUR) 60 MG 24 hr tablet Take 1 tablet (60 mg total) by mouth daily. 30 tablet 3  . Multiple Vitamin  (MULTIVITAMIN WITH MINERALS) TABS tablet Take 1 tablet by mouth 2 (two) times daily.     Marland Kitchen oxyCODONE (ROXICODONE) 5 MG immediate release tablet Take 0.5-1 tablets (2.5-5 mg total) by mouth every 4 (four) hours as needed for severe pain. 12 tablet 0  . potassium chloride (K-DUR,KLOR-CON) 10 MEQ tablet Take 10 mEq by mouth daily.    Marland Kitchen warfarin (COUMADIN) 5 MG tablet Take 2 tablets (10 mg total) by mouth daily at 6 PM. As directed by the coumadin clinic (Patient taking differently: Take 2.5 mg by mouth daily at 6 PM. As directed by the coumadin clinic) 100 tablet 1  . furosemide (LASIX) 40 MG tablet Take 1 tablet (40 mg total) by mouth 2 (two) times daily. 60 tablet 3   No current facility-administered medications for this encounter.    BP (!) 138/95   Pulse 76   Wt 187 lb (84.8 kg)   SpO2 100%   BMI 26.83 kg/m  General: NAD Neck: No JVD, no thyromegaly or thyroid nodule.  Lungs: Clear to auscultation bilaterally with normal respiratory effort. CV: Nondisplaced PMI.  Heart regular S1/S2, no S3/S4, no murmur.  Trace ankle edema.  No carotid bruit.  Normal pedal pulses.  Abdomen: Soft, nontender, no hepatosplenomegaly, no distention.  Skin: Intact without lesions or rashes.  Neurologic: Alert and oriented x 3.  Psych: Normal affect. Extremities: No clubbing or cyanosis.  HEENT: Normal.   Assessment/Plan: 1. HTN: BP is still mildly elevated.  Possible hypertensive cardiomyopathy and hypertensive nephropathy.   - Continue amlodipine, hydralazine, Coreg.   Increase Imdur to 60 mg daily.  2. CKD: Stage 4.  Followed by nephrology. BMET today.   3. Chronic systolic CHF: Echo in 5/02 with EF 25-30%. Nonischemic cardiomyopathy based on cath in 6/18.  Cause of cardiomyopathy may be long-standing, poorly controlled HTN.  NYHA class II symptoms.  Volume status looks good today.  He is improved symptomatically s/p MV repair.  - Continue Lasix 40 mg bid.  BMET today.   - Continue hydralazine 100 mg tid  and increase Imdur to 60 mg daily.   - Continue Coreg 25 mg bid.  - Holding off on ARB/ACEI/ARNI with elevated creatinine.  - Repeat echo in 3 months, if EF remains low would recommend ICD. Narrow QRS so not CRT candidate.   4. Mitral regurgitation: Severe.  See TEE report above.  I suspect there was a component of functional MR from dilated LV, but MV leaflets were thickened, and I was concerned for a component of primary MR as well.  He had MV repair in 6/18, valve looked ok on post-op echo.  Feels better since MV was repaired.  - Continue warfarin for about 3 months.  - Stop ASA for now, will resume ASA when he stops warfarin.  -  Start cardiac rehab.    Followup in 2 months.    Loralie Champagne 08/24/2016

## 2016-08-28 ENCOUNTER — Telehealth (HOSPITAL_COMMUNITY): Payer: Self-pay | Admitting: *Deleted

## 2016-08-28 DIAGNOSIS — I5022 Chronic systolic (congestive) heart failure: Secondary | ICD-10-CM

## 2016-08-28 MED ORDER — FUROSEMIDE 40 MG PO TABS: ORAL_TABLET | ORAL | 3 refills | Status: DC

## 2016-08-28 MED ORDER — POTASSIUM CHLORIDE CRYS ER 20 MEQ PO TBCR: 20.0000 meq | EXTENDED_RELEASE_TABLET | Freq: Every day | ORAL | 3 refills | Status: DC

## 2016-08-28 NOTE — Telephone Encounter (Signed)
Basic Metabolic Panel (BMET)  Order: [id number]  Status:  Final result Visible to patient:  No (Not Released) Dx:  Chronic systolic HF (heart failure) (...  Notes recorded by Darron Doom, RN on 08/28/2016 at 10:26 AM EDT Patient called back and he is agreeable with plan. Medications updated, bmet ordered, and appointment scheduled. ------  Notes recorded by Darron Doom, RN on 08/28/2016 at 8:59 AM EDT Called and left message for patient to call us back. ------  Notes recorded by Larey Dresser, MD on 08/26/2016 at 8:09 PM EDT Take 20 mEq KCl daily, repeat BMET 1 week. ------  Notes recorded by Darron Doom, RN on 08/25/2016 at 1:21 PM EDT Patient is only taking 10 mEq of KCL daily. Please advise. ------  Notes recorded by Larey Dresser, MD on 08/24/2016 at 4:17 PM EDT Decrease Lasix to 40 qam/20 qpm and take an extra 20 mEq KCl for 2 days then continue KCl 40 daily after that.

## 2016-08-29 ENCOUNTER — Inpatient Hospital Stay (HOSPITAL_COMMUNITY): Admission: RE | Admit: 2016-08-29 | Payer: Medicaid Other | Source: Ambulatory Visit

## 2016-09-06 ENCOUNTER — Telehealth (HOSPITAL_COMMUNITY): Payer: Self-pay

## 2016-09-06 NOTE — Telephone Encounter (Signed)
I called and left message on voicemail to call office about scheduling for cardiac rehab. I left office contact information on patient voicemail to return call.  ° °

## 2016-09-12 ENCOUNTER — Telehealth (HOSPITAL_COMMUNITY): Payer: Self-pay

## 2016-09-12 NOTE — Telephone Encounter (Signed)
I returned patient call from message left on office voicemail. I called and left message on voicemail to call office about scheduling for cardiac rehab. I left office contact information on patient voicemail to return call.

## 2016-09-13 ENCOUNTER — Telehealth (HOSPITAL_COMMUNITY): Payer: Self-pay

## 2016-09-13 NOTE — Telephone Encounter (Signed)
I returned patient call. Patient phone stated voicemail has not been set up. Unable to leave a message for patient to return call.

## 2016-09-14 ENCOUNTER — Telehealth (HOSPITAL_COMMUNITY): Payer: Self-pay | Admitting: Pharmacist

## 2016-09-14 NOTE — Telephone Encounter (Signed)
Cardiac Rehab Medication Review by a Pharmacist  Does the patient  feel that his/her medications are working for him/her?  yes  Has the patient been experiencing any side effects to the medications prescribed?  yes  Does the patient measure his/her own blood pressure or blood glucose at home?  Yes, checks three times daily (typically 130s/80-90s)  Does the patient have any problems obtaining medications due to transportation or finances?   no  Understanding of regimen: excellent Understanding of indications: excellent Potential of compliance: excellent    Pharmacist comments: Vernon Blackburn is a 51 y.o. male who seemed in good spirits and health while speaking on the phone. He knew his medication regimen well and seems to be adherent to his medications, with exception to a lapse in amlodipine this month for a few days as he forgot his refill. He has had no side effects at this time, except did develop some dizziness/headache when he missed a few doses of amlodipine but has resolved once resuming his normal schedule for this medication. He had no questions for me and felt that his medication regimens were appropriate and has no trouble obtaining these medications.   [name], Pharm.D. PGY1 Pharmacy Resident 09/14/2016 5:35 PM Main Pharmacy: [phone]

## 2016-09-15 ENCOUNTER — Other Ambulatory Visit (HOSPITAL_COMMUNITY): Payer: Self-pay | Admitting: Cardiology

## 2016-09-18 ENCOUNTER — Telehealth (HOSPITAL_COMMUNITY): Payer: Self-pay | Admitting: *Deleted

## 2016-09-19 ENCOUNTER — Encounter (HOSPITAL_COMMUNITY): Payer: Self-pay

## 2016-09-19 ENCOUNTER — Encounter (HOSPITAL_COMMUNITY)
Admission: RE | Admit: 2016-09-19 | Discharge: 2016-09-19 | Disposition: A | Discharge status: Home / Self Care | Payer: Medicaid Other | Source: Ambulatory Visit | Attending: Cardiology | Admitting: Cardiology

## 2016-09-19 VITALS — BP 131/91 | HR 71 | Ht 69.0 in | Wt 190.3 lb

## 2016-09-19 DIAGNOSIS — I272 Pulmonary hypertension, unspecified: Secondary | ICD-10-CM | POA: Insufficient documentation

## 2016-09-19 DIAGNOSIS — Z87442 Personal history of urinary calculi: Secondary | ICD-10-CM | POA: Diagnosis not present

## 2016-09-19 DIAGNOSIS — I429 Cardiomyopathy, unspecified: Secondary | ICD-10-CM | POA: Diagnosis not present

## 2016-09-19 DIAGNOSIS — Z8701 Personal history of pneumonia (recurrent): Secondary | ICD-10-CM | POA: Diagnosis not present

## 2016-09-19 DIAGNOSIS — J45909 Unspecified asthma, uncomplicated: Secondary | ICD-10-CM | POA: Insufficient documentation

## 2016-09-19 DIAGNOSIS — Z9884 Bariatric surgery status: Secondary | ICD-10-CM | POA: Diagnosis not present

## 2016-09-19 DIAGNOSIS — Z952 Presence of prosthetic heart valve: Secondary | ICD-10-CM | POA: Diagnosis present

## 2016-09-19 DIAGNOSIS — N189 Chronic kidney disease, unspecified: Secondary | ICD-10-CM | POA: Diagnosis not present

## 2016-09-19 DIAGNOSIS — I872 Venous insufficiency (chronic) (peripheral): Secondary | ICD-10-CM | POA: Diagnosis not present

## 2016-09-19 DIAGNOSIS — Z7901 Long term (current) use of anticoagulants: Secondary | ICD-10-CM | POA: Insufficient documentation

## 2016-09-19 DIAGNOSIS — Z79899 Other long term (current) drug therapy: Secondary | ICD-10-CM | POA: Diagnosis not present

## 2016-09-19 DIAGNOSIS — M109 Gout, unspecified: Secondary | ICD-10-CM | POA: Insufficient documentation

## 2016-09-19 DIAGNOSIS — I5022 Chronic systolic (congestive) heart failure: Secondary | ICD-10-CM | POA: Insufficient documentation

## 2016-09-19 DIAGNOSIS — M1990 Unspecified osteoarthritis, unspecified site: Secondary | ICD-10-CM | POA: Insufficient documentation

## 2016-09-19 NOTE — Progress Notes (Signed)
Vernon Blackburn 51 y.o. male DOB: 1965-11-17 MRN: [id number]      Nutrition Note  1. S/P mitral valve replacement    Past Medical History:  Diagnosis Date  . Anemia, chronic disease   . Arthritis    Gout  . Asthma   . Cardiomyopathy ([place])   . CHF (congestive heart failure) ([place])    a. 01/2016: echo showing EF of 20-25%, no WMA, severe MR, and PA Peak Pressure of 52 mm Hg.   Marland Kitchen Chronic systolic HF (heart failure) ([place])   . CKD (chronic kidney disease)    Dr [name]  . Elbow pain, left   . Heart murmur   . History of kidney stones   . Knee pain, bilateral   . Left ankle pain 11/04/2010   Gout  . Leg pain, bilateral   . Peripheral edema    Bilateral lower extremity   . Pneumonia    hx  . Pulmonary HTN ([place])   . S/P minimally invasive mitral valve repair 06/29/2016   30 mm [name] via right mini thoracotomy approach  . Severe mitral regurgitation   . Sleep apnea    hx bariatric  surgery 3 yrs ago lost 166 lbs  . Syncope and collapse 07/14/2016  . Venous insufficiency    Meds reviewed. Coumadinnoted  HT: Ht Readings from Last 1 Encounters:  07/26/16 5\' 10"  (1.778 m)    WT: Wt Readings from Last 3 Encounters:  08/23/16 187 lb (84.8 kg)  07/26/16 195 lb (88.5 kg)  07/24/16 196 lb (88.9 kg)     BMI 28.1   Current tobacco use? No  Labs:  Lipid Panel  No results found for: CHOL, TRIG, HDL, CHOLHDL, VLDL, LDLCALC, LDLDIRECT  Lab Results  Component Value Date   HGBA1C 5.1 06/27/2016   CBG (last 3)  No results for input(s): GLUCAP in the last 72 hours.  Nutrition Diagnosis ? Overweight related to excessive energy intake as evidenced by a BMI of 28.1  Nutrition Goal(s):  ? To be determined with pt  Plan:  Pt to attend nutrition classes ? Nutrition I ? Nutrition II ? Portion Distortion  Will provide client-centered nutrition education as part of interdisciplinary care.   Monitor and evaluate progress toward nutrition goal with team.  Derek Mound, M.Ed, RD, LDN, CDE 09/19/2016 9:07 AM

## 2016-09-19 NOTE — Progress Notes (Signed)
Cardiac Individual Treatment Plan  Patient Details  Name: Vernon Blackburn MRN: [id number] Date of Birth: 28-Jul-1965 Referring Provider:     CARDIAC REHAB PHASE II ORIENTATION from 09/19/2016 in [facility]  Referring Provider  Fransico Him MD and Loralie Champagne MD      Initial Encounter Date:    CARDIAC REHAB PHASE II ORIENTATION from 09/19/2016 in [facility]  Date  09/19/16  Referring Provider  Fransico Him MD and Loralie Champagne MD      Visit Diagnosis: S/P mitral valve replacement  Patient's Home Medications on Admission:  Current Outpatient Prescriptions:  .  acetaminophen (TYLENOL) 500 MG tablet, Take 1,000 mg by mouth every 6 (six) hours as needed for moderate pain., Disp: , Rfl:  .  amLODipine (NORVASC) 10 MG tablet, Take 1 tablet (10 mg total) by mouth daily., Disp: 30 tablet, Rfl: 3 .  carvedilol (COREG) 12.5 MG tablet, Take 2 tablets (25 mg total) by mouth 2 (two) times daily., Disp: 60 tablet, Rfl: 1 .  cholecalciferol (VITAMIN D) 1000 units tablet, Take 1,000 Units by mouth daily., Disp: , Rfl:  .  FeAspGl-FeFum-B12-FA-C-Succ Ac (FERREX 28 PO), Take 1 tablet by mouth daily. , Disp: , Rfl:  .  Febuxostat (ULORIC) 80 MG TABS, Take 80 mg by mouth daily. , Disp: , Rfl:  .  furosemide (LASIX) 40 MG tablet, Take 40 mg (1 Tablet) in the AM and 20 mg (0.5 Tablet) in the PM., Disp: 60 tablet, Rfl: 3 .  hydrALAZINE (APRESOLINE) 100 MG tablet, Take 1 tablet (100 mg total) by mouth 3 (three) times daily., Disp: 90 tablet, Rfl: 3 .  HYDROcodone-acetaminophen (NORCO/VICODIN) 5-325 MG tablet, Take 1 tablet by mouth every 6 (six) hours as needed. (Patient not taking: Reported on 09/14/2016), Disp: 6 tablet, Rfl: 0 .  isosorbide mononitrate (IMDUR) 60 MG 24 hr tablet, Take 1 tablet (60 mg total) by mouth daily., Disp: 30 tablet, Rfl: 3 .  Multiple Vitamin (MULTIVITAMIN WITH MINERALS) TABS tablet, Take 1 tablet by mouth 2 (two)  times daily. , Disp: , Rfl:  .  oxyCODONE (ROXICODONE) 5 MG immediate release tablet, Take 0.5-1 tablets (2.5-5 mg total) by mouth every 4 (four) hours as needed for severe pain. (Patient not taking: Reported on 09/14/2016), Disp: 12 tablet, Rfl: 0 .  potassium chloride (K-DUR,KLOR-CON) 20 MEQ tablet, Take 1 tablet (20 mEq total) by mouth daily., Disp: 30 tablet, Rfl: 3 .  warfarin (COUMADIN) 5 MG tablet, Take 2 tablets (10 mg total) by mouth daily at 6 PM. As directed by the coumadin clinic (Patient taking differently: Take 2.5 mg by mouth daily at 6 PM. As directed by the coumadin clinic), Disp: 100 tablet, Rfl: 1  Past Medical History: Past Medical History:  Diagnosis Date  . Anemia, chronic disease   . Arthritis    Gout  . Asthma   . Cardiomyopathy ([place])   . CHF (congestive heart failure) ([employer])    a. 01/2016: echo showing EF of 20-25%, no WMA, severe MR, and PA Peak Pressure of 52 mm Hg.   Marland Kitchen Chronic systolic HF (heart failure) ([place])   . CKD (chronic kidney disease)    Dr [name]  . Elbow pain, left   . Heart murmur   . History of kidney stones   . Knee pain, bilateral   . Left ankle pain 11/04/2010   Gout  . Leg pain, bilateral   . Peripheral edema    Bilateral  lower extremity   . Pneumonia    hx  . Pulmonary HTN ([place])   . S/P minimally invasive mitral valve repair 06/29/2016   30 mm [name] via right mini thoracotomy approach  . Severe mitral regurgitation   . Sleep apnea    hx bariatric  surgery 3 yrs ago lost 166 lbs  . Syncope and collapse 07/14/2016  . Venous insufficiency     Tobacco Use: History  Smoking Status  . Never Smoker  Smokeless Tobacco  . Never Used    Labs: Recent Review Flowsheet Data    Labs for ITP Cardiac and Pulmonary Rehab Latest Ref Rng & Units 06/29/2016 06/29/2016 06/29/2016 06/29/2016 06/30/2016   Hemoglobin A1c 4.8 - 5.6 % - - - - -   PHART 7.350 - 7.450 7.472(H) - 7.375 7.392 -   PCO2ART 32.0 - 48.0 mmHg 41.1 - 44.7 45.7  -   HCO3 20.0 - 28.0 mmol/L 30.4(H) - 26.2 27.9 -   TCO2 0 - 100 mmol/L 32 27 28 29 29    ACIDBASEDEF 0.0 - 2.0 mmol/L - - - - -   O2SAT % 98.0 - 99.0 99.0 -      Capillary Blood Glucose: Lab Results  Component Value Date   GLUCAP 90 07/02/2016   GLUCAP 96 07/02/2016   GLUCAP 90 07/02/2016   GLUCAP 90 07/02/2016   GLUCAP 84 07/01/2016     Exercise Target Goals: Date: 09/19/16  Exercise Program Goal: Individual exercise prescription set with THRR, safety & activity barriers. Participant demonstrates ability to understand and report RPE using BORG scale, to self-measure pulse accurately, and to acknowledge the importance of the exercise prescription.  Exercise Prescription Goal: Starting with aerobic activity 30 plus minutes a day, 3 days per week for initial exercise prescription. Provide home exercise prescription and guidelines that participant acknowledges understanding prior to discharge.  Activity Barriers & Risk Stratification:     Activity Barriers & Cardiac Risk Stratification - 09/19/16 0921      Activity Barriers & Cardiac Risk Stratification   Activity Barriers Other (comment);Muscular Weakness;Deconditioning   Comments gout L toe   Cardiac Risk Stratification High      6 Minute Walk:     6 Minute Walk    Row Name 09/19/16 0907         6 Minute Walk   Phase Initial     Distance 1738 feet     Walk Time 6 minutes     # of Rest Breaks 0     MPH 3.3     METS 4.7     RPE 9     VO2 Peak 16.6     Symptoms No     Resting HR 71 bpm     Resting BP 131/91     Resting Oxygen Saturation  100 %     Exercise Oxygen Saturation  during 6 min walk 100 %     Max Ex. HR 94 bpm     Max Ex. BP 139/96     2 Minute Post BP 114/82        Oxygen Initial Assessment:   Oxygen Re-Evaluation:   Oxygen Discharge (Final Oxygen Re-Evaluation):   Initial Exercise Prescription:     Initial Exercise Prescription - 09/19/16 0900      Date of Initial Exercise RX  and Referring Provider   Date 09/19/16   Referring Provider Fransico Him MD and Loralie Champagne MD     Treadmill  MPH 3   Grade 1   Minutes 10   METs 3.71     Bike   Level 1   Minutes 10   METs 3.22     NuStep   Level 3   SPM 80   Minutes 10   METs 2.5     Prescription Details   Frequency (times per week) 3   Duration Progress to 30 minutes of continuous aerobic without signs/symptoms of physical distress     Intensity   THRR 40-80% of Max Heartrate 68-136   Ratings of Perceived Exertion 11-13   Perceived Dyspnea 0-4     Progression   Progression Continue to progress workloads to maintain intensity without signs/symptoms of physical distress.     Resistance Training   Training Prescription Yes   Weight 3lbs   Reps 10-15      Perform Capillary Blood Glucose checks as needed.  Exercise Prescription Changes:   Exercise Comments:   Exercise Goals and Review:     Exercise Goals    Row Name 09/19/16 8921             Exercise Goals   Increase Physical Activity Yes       Intervention Provide advice, education, support and counseling about physical activity/exercise needs.;Develop an individualized exercise prescription for aerobic and resistive training based on initial evaluation findings, risk stratification, comorbidities and participant's personal goals.       Expected Outcomes Achievement of increased cardiorespiratory fitness and enhanced flexibility, muscular endurance and strength shown through measurements of functional capacity and personal statement of participant.       Increase Strength and Stamina Yes  increase muscle mass       Intervention Provide advice, education, support and counseling about physical activity/exercise needs.;Develop an individualized exercise prescription for aerobic and resistive training based on initial evaluation findings, risk stratification, comorbidities and participant's personal goals.       Expected Outcomes  Achievement of increased cardiorespiratory fitness and enhanced flexibility, muscular endurance and strength shown through measurements of functional capacity and personal statement of participant.       Able to understand and use rate of perceived exertion (RPE) scale Yes       Intervention Provide education and explanation on how to use RPE scale       Expected Outcomes Short Term: Able to use RPE daily in rehab to express subjective intensity level;Long Term:  Able to use RPE to guide intensity level when exercising independently       Knowledge and understanding of Target Heart Rate Range (THRR) Yes       Intervention Provide education and explanation of THRR including how the numbers were predicted and where they are located for reference       Expected Outcomes Short Term: Able to state/look up THRR;Long Term: Able to use THRR to govern intensity when exercising independently;Short Term: Able to use daily as guideline for intensity in rehab       Able to check pulse independently Yes       Intervention Provide education and demonstration on how to check pulse in carotid and radial arteries.;Review the importance of being able to check your own pulse for safety during independent exercise       Expected Outcomes Short Term: Able to explain why pulse checking is important during independent exercise;Long Term: Able to check pulse independently and accurately       Understanding of Exercise Prescription Yes  Intervention Provide education, explanation, and written materials on patient's individual exercise prescription       Expected Outcomes Short Term: Able to explain program exercise prescription;Long Term: Able to explain home exercise prescription to exercise independently          Exercise Goals Re-Evaluation :    Discharge Exercise Prescription (Final Exercise Prescription Changes):   Nutrition:  Target Goals: Understanding of nutrition guidelines, daily intake of sodium  1500mg , cholesterol 200mg , calories 30% from fat and 7% or less from saturated fats, daily to have 5 or more servings of fruits and vegetables.  Biometrics:      Post Biometrics - 09/19/16 0912       Post  Biometrics   Height 5\' 9"  (1.753 m)   Weight 190 lb 4.1 oz (86.3 kg)   Waist Circumference 37.5 inches   Hip Circumference 39.75 inches   Waist to Hip Ratio 0.94 %   BMI (Calculated) 28.08   Triceps Skinfold 12 mm   % Body Fat 24.9 %   Grip Strength 46 kg   Flexibility 13 in   Single Leg Stand 15 seconds      Nutrition Therapy Plan and Nutrition Goals:   Nutrition Discharge: Nutrition Scores:   Nutrition Goals Re-Evaluation:   Nutrition Goals Re-Evaluation:   Nutrition Goals Discharge (Final Nutrition Goals Re-Evaluation):   Psychosocial: Target Goals: Acknowledge presence or absence of significant depression and/or stress, maximize coping skills, provide positive support system. Participant is able to verbalize types and ability to use techniques and skills needed for reducing stress and depression.  Initial Review & Psychosocial Screening:     Initial Psych Review & Screening - 09/19/16 0900      Initial Review   Current issues with Current Stress Concerns   Source of Stress Concerns Family  divorce      Family Dynamics   Good Support System? Yes  friend      Barriers   Psychosocial barriers to participate in program The patient should benefit from training in stress management and relaxation.     Screening Interventions   Interventions Encouraged to exercise;Provide feedback about the scores to participant      Quality of Life Scores:     Quality of Life - 09/19/16 0907      Quality of Life Scores   Health/Function Pre 24.4 %   Socioeconomic Pre 26.1 %   Psych/Spiritual Pre 28.2 %   Family Pre 20.4 %   GLOBAL Pre 25 %      PHQ-9: Recent Review Flowsheet Data    There is no flowsheet data to display.     Interpretation of Total  Score  Total Score Depression Severity:  1-4 = Minimal depression, 5-9 = Mild depression, 10-14 = Moderate depression, 15-19 = Moderately severe depression, 20-27 = Severe depression   Psychosocial Evaluation and Intervention:   Psychosocial Re-Evaluation:   Psychosocial Discharge (Final Psychosocial Re-Evaluation):   Vocational Rehabilitation: Provide vocational rehab assistance to qualifying candidates.   Vocational Rehab Evaluation & Intervention:     Vocational Rehab - 09/19/16 0859      Initial Vocational Rehab Evaluation & Intervention   Assessment shows need for Vocational Rehabilitation No      Education: Education Goals: Education classes will be provided on a weekly basis, covering required topics. Participant will state understanding/return demonstration of topics presented.  Learning Barriers/Preferences:     Learning Barriers/Preferences - 09/19/16 0920      Learning Barriers/Preferences   Learning Barriers Sight  Learning Preferences Written Material;Video;Skilled Demonstration      Education Topics: Count Your Pulse:  -Group instruction provided by verbal instruction, demonstration, patient participation and written materials to support subject.  Instructors address importance of being able to find your pulse and how to count your pulse when at home without a heart monitor.  Patients get hands on experience counting their pulse with staff help and individually.   Heart Attack, Angina, and Risk Factor Modification:  -Group instruction provided by verbal instruction, video, and written materials to support subject.  Instructors address signs and symptoms of angina and heart attacks.    Also discuss risk factors for heart disease and how to make changes to improve heart health risk factors.   Functional Fitness:  -Group instruction provided by verbal instruction, demonstration, patient participation, and written materials to support subject.  Instructors  address safety measures for doing things around the house.  Discuss how to get up and down off the floor, how to pick things up properly, how to safely get out of a chair without assistance, and balance training.   Meditation and Mindfulness:  -Group instruction provided by verbal instruction, patient participation, and written materials to support subject.  Instructor addresses importance of mindfulness and meditation practice to help reduce stress and improve awareness.  Instructor also leads participants through a meditation exercise.    Stretching for Flexibility and Mobility:  -Group instruction provided by verbal instruction, patient participation, and written materials to support subject.  Instructors lead participants through series of stretches that are designed to increase flexibility thus improving mobility.  These stretches are additional exercise for major muscle groups that are typically performed during regular warm up and cool down.   Hands Only CPR:  -Group verbal, video, and participation provides a basic overview of AHA guidelines for community CPR. Role-play of emergencies allow participants the opportunity to practice calling for help and chest compression technique with discussion of AED use.   Hypertension: -Group verbal and written instruction that provides a basic overview of hypertension including the most recent diagnostic guidelines, risk factor reduction with self-care instructions and medication management.    Nutrition I class: Heart Healthy Eating:  -Group instruction provided by PowerPoint slides, verbal discussion, and written materials to support subject matter. The instructor gives an explanation and review of the Therapeutic Lifestyle Changes diet recommendations, which includes a discussion on lipid goals, dietary fat, sodium, fiber, plant stanol/sterol esters, sugar, and the components of a well-balanced, healthy diet.   Nutrition II class: Lifestyle  Skills:  -Group instruction provided by PowerPoint slides, verbal discussion, and written materials to support subject matter. The instructor gives an explanation and review of label reading, grocery shopping for heart health, heart healthy recipe modifications, and ways to make healthier choices when eating out.   Diabetes Question & Answer:  -Group instruction provided by PowerPoint slides, verbal discussion, and written materials to support subject matter. The instructor gives an explanation and review of diabetes co-morbidities, pre- and post-prandial blood glucose goals, pre-exercise blood glucose goals, signs, symptoms, and treatment of hypoglycemia and hyperglycemia, and foot care basics.   Diabetes Blitz:  -Group instruction provided by PowerPoint slides, verbal discussion, and written materials to support subject matter. The instructor gives an explanation and review of the physiology behind type 1 and type 2 diabetes, diabetes medications and rational behind using different medications, pre- and post-prandial blood glucose recommendations and Hemoglobin A1c goals, diabetes diet, and exercise including blood glucose guidelines for exercising safely.  Portion Distortion:  -Group instruction provided by PowerPoint slides, verbal discussion, written materials, and food models to support subject matter. The instructor gives an explanation of serving size versus portion size, changes in portions sizes over the last 20 years, and what consists of a serving from each food group.   Stress Management:  -Group instruction provided by verbal instruction, video, and written materials to support subject matter.  Instructors review role of stress in heart disease and how to cope with stress positively.     Exercising on Your Own:  -Group instruction provided by verbal instruction, power point, and written materials to support subject.  Instructors discuss benefits of exercise, components of  exercise, frequency and intensity of exercise, and end points for exercise.  Also discuss use of nitroglycerin and activating EMS.  Review options of places to exercise outside of rehab.  Review guidelines for sex with heart disease.   Cardiac Drugs I:  -Group instruction provided by verbal instruction and written materials to support subject.  Instructor reviews cardiac drug classes: antiplatelets, anticoagulants, beta blockers, and statins.  Instructor discusses reasons, side effects, and lifestyle considerations for each drug class.   Cardiac Drugs II:  -Group instruction provided by verbal instruction and written materials to support subject.  Instructor reviews cardiac drug classes: angiotensin converting enzyme inhibitors (ACE-I), angiotensin II receptor blockers (ARBs), nitrates, and calcium channel blockers.  Instructor discusses reasons, side effects, and lifestyle considerations for each drug class.   Anatomy and Physiology of the Circulatory System:  Group verbal and written instruction and models provide basic cardiac anatomy and physiology, with the coronary electrical and arterial systems. Review of: AMI, Angina, Valve disease, Heart Failure, Peripheral Artery Disease, Cardiac Arrhythmia, Pacemakers, and the ICD.   Other Education:  -Group or individual verbal, written, or video instructions that support the educational goals of the cardiac rehab program.   Knowledge Questionnaire Score:     Knowledge Questionnaire Score - 09/19/16 0858      Knowledge Questionnaire Score   Pre Score 21/24      Core Components/Risk Factors/Patient Goals at Admission:     Personal Goals and Risk Factors at Admission - 09/19/16 0925      Core Components/Risk Factors/Patient Goals on Admission   Heart Failure Yes   Intervention Provide a combined exercise and nutrition program that is supplemented with education, support and counseling about heart failure. Directed toward relieving  symptoms such as shortness of breath, decreased exercise tolerance, and extremity edema.   Expected Outcomes Improve functional capacity of life;Short term: Attendance in program 2-3 days a week with increased exercise capacity. Reported lower sodium intake. Reported increased fruit and vegetable intake. Reports medication compliance.;Short term: Daily weights obtained and reported for increase. Utilizing diuretic protocols set by physician.;Long term: Adoption of self-care skills and reduction of barriers for early signs and symptoms recognition and intervention leading to self-care maintenance.      Core Components/Risk Factors/Patient Goals Review:    Core Components/Risk Factors/Patient Goals at Discharge (Final Review):    ITP Comments:     ITP Comments    Row Name 09/19/16 0748           ITP Comments Dr. Fransico Him, Medical Director           Comments: Patient attended orientation from [phone] to [phone] to review rules and guidelines for program. Completed 6 minute walk test, Intitial ITP, and exercise prescription.  VSS. Telemetry-sinus rhythm,   Asymptomatic.

## 2016-09-22 ENCOUNTER — Other Ambulatory Visit: Payer: Self-pay | Admitting: Thoracic Surgery (Cardiothoracic Vascular Surgery)

## 2016-09-22 DIAGNOSIS — I509 Heart failure, unspecified: Secondary | ICD-10-CM

## 2016-09-25 ENCOUNTER — Ambulatory Visit
Admission: RE | Admit: 2016-09-25 | Discharge: 2016-09-25 | Disposition: A | Discharge status: Home / Self Care | Payer: Medicaid Other | Source: Ambulatory Visit | Attending: Thoracic Surgery (Cardiothoracic Vascular Surgery) | Admitting: Thoracic Surgery (Cardiothoracic Vascular Surgery)

## 2016-09-25 ENCOUNTER — Encounter (HOSPITAL_COMMUNITY): Admission: RE | Admit: 2016-09-25 | Payer: Medicaid Other | Source: Ambulatory Visit

## 2016-09-25 ENCOUNTER — Ambulatory Visit (INDEPENDENT_AMBULATORY_CARE_PROVIDER_SITE_OTHER): Payer: Self-pay | Admitting: Thoracic Surgery (Cardiothoracic Vascular Surgery)

## 2016-09-25 ENCOUNTER — Encounter: Payer: Self-pay | Admitting: Thoracic Surgery (Cardiothoracic Vascular Surgery)

## 2016-09-25 VITALS — BP 146/99 | HR 71 | Resp 16 | Ht 70.0 in | Wt 192.0 lb

## 2016-09-25 DIAGNOSIS — Z9889 Other specified postprocedural states: Secondary | ICD-10-CM

## 2016-09-25 DIAGNOSIS — I340 Nonrheumatic mitral (valve) insufficiency: Secondary | ICD-10-CM

## 2016-09-25 DIAGNOSIS — I341 Nonrheumatic mitral (valve) prolapse: Secondary | ICD-10-CM

## 2016-09-25 DIAGNOSIS — I509 Heart failure, unspecified: Secondary | ICD-10-CM

## 2016-09-25 NOTE — Patient Instructions (Signed)
You may resume unrestricted physical activity without any particular limitations at this time.  Continue all previous medications without any changes at this time.  However, I think it would be reasonable to stop warfarin and resume taking aspirin 81 mg/day at this time if okay with Dr Aundra Dubin  Continue to monitor your blood pressure closely  Endocarditis is a potentially serious infection of heart valves or inside lining of the heart.  It occurs more commonly in patients with diseased heart valves (such as patient's with aortic or mitral valve disease) and in patients who have undergone heart valve repair or replacement.  Certain surgical and dental procedures may put you at risk, such as dental cleaning, other dental procedures, or any surgery involving the respiratory, urinary, gastrointestinal tract, gallbladder or prostate gland.   To minimize your chances for develooping endocarditis, maintain good oral health and seek prompt medical attention for any infections involving the mouth, teeth, gums, skin or urinary tract.    Always notify your doctor or dentist about your underlying heart valve condition before having any invasive procedures. You will need to take antibiotics before certain procedures, including all routine dental cleanings or other dental procedures.  Your cardiologist or dentist should prescribe these antibiotics for you to be taken ahead of time.

## 2016-09-25 NOTE — Progress Notes (Signed)
[address]       [place] [address]             [phone]     CARDIOTHORACIC SURGERY OFFICE NOTE  Referring Provider is Larey Dresser, MD  Primary Cardiologist is Sueanne Margarita, MD PCP is [name], MD   HPI:  Patient is a 51 year old African-American male with history of nonrheumatic mitral regurgitation, long-standing hypertensive heart disease with chronic combined systolic and diastolic congestive heart failure, chronic kidney disease, and obstructive sleep apnea who returns to the office for routine follow-up today status post minimally invasive mitral valve repair on 06/29/2016 for severe symptomatic secondary mitral regurgitation refractory to medical therapy. His early postoperative recovery was uneventful and he was discharged home on the fifth postoperative day. He was readmitted to the hospital briefly on 07/14/2016 with atypical chest pain. A nuclear medicine V/Q scan was performed by the medical team at that time and felt to be indeterminate for the possibility of pulmonary embolism, although the patient was notably supratherapeutic on warfarin at the time.  The abnormality on the VQ scan was related to ventilation and perfusion defect in the right middle lobe and right lower lobe where the patient had atelectasis related to the patient's recent surgery.  Transthoracic echocardiogram performed 07/21/2016 revealed intact mitral valve repair with minimal residual mitral regurgitation. Mean transvalvular gradient across the mitral valve was estimated 4 mmHg. He was last seen in our office on 09/24/2016 at which time he was doing well.  Left ventricular ejection fraction was estimated 25-30% which actually was slightly better than his last echocardiogram prior to surgery.  He was last seen in our office on 07/24/2016 at which time he was doing well, and since then he has been seen in follow-up by [name] on 08/23/2016.  He returns to our office  today and reports it is doing just fine. 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 He states that his breathing is dramatically better than it had been for many years. He reports no shortness of breath. His activity level is good. He plans to start cardiac rehabilitation program. He states that all symptoms which he had previously blamed on asthma have completely resolved.  He is quite pleased with his progress. He hopes to stop warfarin.   Current Outpatient Prescriptions  Medication Sig Dispense Refill  . acetaminophen (TYLENOL) 500 MG tablet Take 1,000 mg by mouth every 6 (six) hours as needed for moderate pain.    Marland Kitchen amLODipine (NORVASC) 10 MG tablet Take 1 tablet (10 mg total) by mouth daily. 30 tablet 3  . carvedilol (COREG) 12.5 MG tablet Take 2 tablets (25 mg total) by mouth 2 (two) times daily. 60 tablet 1  . cholecalciferol (VITAMIN D) 1000 units tablet Take 1,000 Units by mouth daily.    . FeAspGl-FeFum-B12-FA-C-Succ Ac (FERREX 28 PO) Take 1 tablet by mouth daily.     . Febuxostat (ULORIC) 80 MG TABS Take 80 mg by mouth daily.     . furosemide (LASIX) 40 MG tablet Take 40 mg (1 Tablet) in the AM and 20 mg (0.5 Tablet) in the PM. 60 tablet 3  . hydrALAZINE (APRESOLINE) 100 MG tablet Take 1 tablet (100 mg total) by mouth 3 (three) times daily. 90 tablet 3  . isosorbide mononitrate (IMDUR) 60 MG 24 hr tablet Take 1 tablet (60 mg total) by mouth  daily. 30 tablet 3  . Multiple Vitamin (MULTIVITAMIN WITH MINERALS) TABS tablet Take 1 tablet by mouth 2 (two) times daily.     . potassium chloride (K-DUR,KLOR-CON) 20 MEQ tablet Take 1 tablet (20 mEq total) by mouth daily. 30 tablet 3  . warfarin (COUMADIN) 5 MG tablet Take 2 tablets (10 mg total) by mouth daily at 6 PM. As directed by the coumadin clinic (Patient taking differently: Take 2.5 mg by mouth daily at 6 PM. As directed by the coumadin clinic) 100 tablet 1   No current  facility-administered medications for this visit.       Physical Exam:   BP (!) 146/99 (BP Location: Right Arm, Patient Position: Sitting, Cuff Size: Large)   Pulse 71   Resp 16   Ht 5\' 10"  (1.778 m)   Wt 192 lb (87.1 kg)   SpO2 97% Comment: ON RA  BMI 27.55 kg/m   General:  Well-appearing  Chest:   Clear to auscultation  CV:   Regular rate and rhythm with soft systolic murmur heard along the left lower sternal border  Incisions:  Completely healed  Abdomen:  Soft nontender  Extremities:  Warm and well-perfused  Diagnostic Tests:  Transthoracic Echocardiography  Patient:    Keri, Tavella MR #:       [id number] Study Date: 07/21/2016 Gender:     M Age:        51 Height:     177.8 cm Weight:     90.2 kg BSA:        2.13 m^2 Pt. Status: Room:       B15C   ATTENDING    Veryl Speak [id number]  Sherrin Daisy, Parnell, Hernandez, Inpatient  SONOGRAPHER  [facility]  cc:  ------------------------------------------------------------------- LV EF: 25% -   30%  ------------------------------------------------------------------- History:   PMH:  Mitral Valve Disorder.  ------------------------------------------------------------------- Study Conclusions  - Left ventricle: The cavity size was mildly dilated. There was   mild concentric hypertrophy. Systolic function was severely   reduced. The estimated ejection fraction was in the range of 25%   to 30%. Diffuse hypokinesis. Features are consistent with a   pseudonormal left ventricular filling pattern, with concomitant   abnormal relaxation and increased filling pressure (grade 2   diastolic dysfunction). Doppler parameters are consistent with   elevated ventricular end-diastolic filling pressure. - Ventricular septum: Septal motion showed paradox. The contour   showed diastolic flattening and systolic flattening. - Aortic valve: There was no regurgitation. -  Mitral valve: S/p minimally invasive mitral valve repair with a   30 mm annuloplasty ring. There was mild regurgitation. Mean   gradient (D): 4 mm Hg. Valve area by continuity equation (using   LVOT flow): 2.4 cm^2. - Left atrium: The atrium was moderately dilated. - Right ventricle: Systolic function was normal. - Tricuspid valve: There was mild regurgitation. - Pulmonary arteries: Systolic pressure was mildly increased. PA   peak pressure: 37 mm Hg (S). - Inferior vena cava: The vessel was dilated. The respirophasic   diameter changes were blunted (< 50%), consistent with elevated   central venous pressure. - Pericardium, extracardiac: There was no pericardial effusion.  Impressions:  - S/p minimally invasive mitral valve repair with a 30 mm   annuloplasty ring. Normal transmitral gradients.  ------------------------------------------------------------------- Study data:  The previous study was not available, so comparison was made to the report of  04/21/2016.  Study status:  Routine. Procedure:  Transthoracic echocardiography. Image quality was adequate.  Study completion:  There were no complications. Transthoracic echocardiography.  M-mode, complete 2D, spectral Doppler, and color Doppler.  Birthdate:  Patient birthdate: [birth year].  Age:  Patient is 51 yr old.  Sex:  Gender: male. BMI: 28.5 kg/m^2.  Blood pressure:     114/85  Patient status: Inpatient.  Study date:  Study date: 07/21/2016. Study time: 12:48 PM.  Location:  Bedside.  -------------------------------------------------------------------  ------------------------------------------------------------------- Left ventricle:  The cavity size was mildly dilated. There was mild concentric hypertrophy. Systolic function was severely reduced. The estimated ejection fraction was in the range of 25% to 30%. Diffuse hypokinesis. Features are consistent with a pseudonormal left ventricular filling pattern, with  concomitant abnormal relaxation and increased filling pressure (grade 2 diastolic dysfunction). Doppler parameters are consistent with elevated ventricular end-diastolic filling pressure.  ------------------------------------------------------------------- Aortic valve:   Trileaflet; normal thickness leaflets. Mobility was not restricted.  Doppler:  Transvalvular velocity was within the normal range. There was no stenosis. There was no regurgitation.   ------------------------------------------------------------------- Aorta:  Aortic root: The aortic root was normal in size.  ------------------------------------------------------------------- Mitral valve:  S/p minimally invasive mitral valve repair with a 30 mm annuloplasty ring. Mobility was not restricted.  Doppler: Transvalvular velocity was within the normal range. There was no evidence for stenosis. There was mild regurgitation.    Valve area by continuity equation (using LVOT flow): 2.4 cm^2. Indexed valve area by continuity equation (using LVOT flow): 1.13 cm^2/m^2. Mean gradient (D): 4 mm Hg.  ------------------------------------------------------------------- Left atrium:  The atrium was moderately dilated.  ------------------------------------------------------------------- Right ventricle:  The cavity size was normal. Wall thickness was normal. Systolic function was normal.  ------------------------------------------------------------------- Ventricular septum:   Septal motion showed paradox. The contour showed diastolic flattening and systolic flattening.  ------------------------------------------------------------------- Pulmonic valve:    Structurally normal valve.   Cusp separation was normal.  Doppler:  Transvalvular velocity was within the normal range. There was no evidence for stenosis. There was no regurgitation.  ------------------------------------------------------------------- Tricuspid valve:    Structurally normal valve.    Doppler: Transvalvular velocity was within the normal range. There was mild regurgitation.  ------------------------------------------------------------------- Pulmonary artery:   The main pulmonary artery was normal-sized. Systolic pressure was mildly increased.  ------------------------------------------------------------------- Right atrium:  The atrium was normal in size.  ------------------------------------------------------------------- Pericardium:  There was no pericardial effusion.  ------------------------------------------------------------------- Systemic veins: Inferior vena cava: The vessel was dilated. The respirophasic diameter changes were blunted (< 50%), consistent with elevated central venous pressure.  ------------------------------------------------------------------- Measurements   Left ventricle                           Value          Reference  LV ID, ED, PLAX chordal                  52    mm       43 - 52  LV ID, ES, PLAX chordal          (H)     44.3  mm       23 - 38  LV fx shortening, PLAX chordal   (L)     15    %        >=29  LV PW thickness, ED  12.4  mm       ----------  IVS/LV PW ratio, ED                      1.02           <=1.3  Stroke volume, 2D                        94    ml       ----------  Stroke volume/bsa, 2D                    44    ml/m^2   ----------  LV ejection fraction, 1-p A4C            30    %        ----------  LV end-diastolic volume, 2-p             170   ml       ----------  LV end-systolic volume, 2-p              116   ml       ----------  LV ejection fraction, 2-p                32    %        ----------  Stroke volume, 2-p                       54    ml       ----------  LV end-diastolic volume/bsa, 2-p         80    ml/m^2   ----------  LV end-systolic volume/bsa, 2-p          54    ml/m^2   ----------  Stroke volume/bsa, 2-p                   25.4  ml/m^2    ----------  LV filling time, D, DP                   390   ms       ----------  LV e&', lateral                           5.22  cm/s     ----------  LV E/e&', lateral                         9.97           ----------  LV e&', medial                            5.22  cm/s     ----------  LV E/e&', medial                          9.97           ----------  LV e&', average                           5.22  cm/s     ----------  LV E/e&', average  9.97           ----------    Ventricular septum                       Value          Reference  IVS thickness, ED                        12.6  mm       ----------    LVOT                                     Value          Reference  LVOT ID, S                               24    mm       ----------  LVOT area                                4.52  cm^2     ----------  LVOT peak velocity, S                    115   cm/s     ----------  LVOT mean velocity, S                    74.1  cm/s     ----------  LVOT VTI, S                              20.8  cm       ----------  LVOT peak gradient, S                    5     mm Hg    ----------    Aorta                                    Value          Reference  Aortic root ID, ED                       34    mm       ----------    Left atrium                              Value          Reference  LA ID, A-P, ES                           44    mm       ----------  LA ID/bsa, A-P                           2.07  cm/m^2   <=2.2  LA volume, S  112   ml       ----------  LA volume/bsa, S                         52.6  ml/m^2   ----------  LA volume, ES, 1-p A4C                   83.5  ml       ----------  LA volume/bsa, ES, 1-p A4C               39.2  ml/m^2   ----------  LA volume, ES, 1-p A2C                   136   ml       ----------  LA volume/bsa, ES, 1-p A2C               63.8  ml/m^2   ----------    Mitral valve                             Value           Reference  Mitral E-wave peak velocity              52.06 cm/s     ----------  Mitral A-wave peak velocity              136   cm/s     ----------  Mitral mean velocity, D                  97.7  cm/s     ----------  Mitral deceleration time         (H)     320   ms       150 - 230  Mitral mean gradient, D                  4     mm Hg    ----------  Mitral E/A ratio, peak                   0.38           ----------  Mitral valve area, LVOT                  2.4   cm^2     ----------  continuity  Mitral valve area/bsa, LVOT              1.13  cm^2/m^2 ----------  continuity    Pulmonary arteries                       Value          Reference  PA pressure, S, DP               (H)     37    mm Hg    <=30    Tricuspid valve                          Value          Reference  Tricuspid regurg peak velocity           271   cm/s     ----------  Tricuspid peak RV-RA gradient  29    mm Hg    ----------    Right atrium                             Value          Reference  RA ID, S-I, ES, A4C              (H)     62.2  mm       34 - 49  RA area, ES, A4C                 (H)     24.7  cm^2     8.3 - 19.5  RA volume, ES, A/L                       78.5  ml       ----------  RA volume/bsa, ES, A/L                   36.9  ml/m^2   ----------    Systemic veins                           Value          Reference  Estimated CVP                            8     mm Hg    ----------    Right ventricle                          Value          Reference  RV ID, minor axis, ED, A4C base          46.3  mm       ----------  TAPSE                                    20.1  mm       ----------  RV pressure, S, DP               (H)     37    mm Hg    <=30  RV s&', lateral, S                        11.2  cm/s     ----------  Legend: (L)  and  (H)  mark values outside specified reference range.  ------------------------------------------------------------------- Prepared and Electronically Authenticated  by  Ena Dawley, M.D. 2018-07-20T14:03:48    CHEST  2 VIEW  COMPARISON:  Radiographs 07/26/2016 and 07/24/2016.  FINDINGS: Cardiomegaly appears stable to slightly improved status post mitral valve surgery. There is improved aeration of the lung bases with minimal residual right basilar atelectasis. There is no significant pleural effusion, pulmonary edema or pneumothorax. The bones appear unchanged.  IMPRESSION: Interval partial clearing of the lung bases. No acute cardiopulmonary process.   Electronically Signed   By: Richardean Sale M.D.   On: 09/25/2016 12:19   Impression:  Patient is doing very well approximately 3 months status post minimally invasive mitral valve repair.  I do not feel that there is any convincing  evidence that the patient never had a pulmonary embolism, and I think it would be reasonable to stop warfarin at any time.  Plan:  I have instructed the patient to discuss with [name] whether not he may stop warfarin anticoagulation at this time. I have noted to the patient that when he stops warfarin he should resume taking aspirin on a daily basis. We have otherwise not recommended any changes to his current medications.  The patient has been reminded regarding the importance of dental hygiene and the lifelong need for antibiotic prophylaxis for all dental cleanings and other related invasive procedures.  The patient will return to our office for routine follow-up next June, approximately 1 year following his surgery. He will call and return sooner should specific problems or questions arise.   I spent in excess of 15 minutes during the conduct of this office consultation and >50% of this time involved direct face-to-face encounter with the patient for counseling and/or coordination of their care.    [name], MD 09/25/2016 12:41 PM

## 2016-09-27 ENCOUNTER — Encounter (HOSPITAL_COMMUNITY): Payer: Self-pay

## 2016-09-27 ENCOUNTER — Ambulatory Visit (INDEPENDENT_AMBULATORY_CARE_PROVIDER_SITE_OTHER): Payer: Medicaid Other | Admitting: *Deleted

## 2016-09-27 ENCOUNTER — Encounter (HOSPITAL_COMMUNITY)
Admission: RE | Admit: 2016-09-27 | Discharge: 2016-09-27 | Disposition: A | Discharge status: Home / Self Care | Payer: Medicaid Other | Source: Ambulatory Visit | Attending: Cardiology | Admitting: Cardiology

## 2016-09-27 DIAGNOSIS — I2699 Other pulmonary embolism without acute cor pulmonale: Secondary | ICD-10-CM

## 2016-09-27 DIAGNOSIS — Z7901 Long term (current) use of anticoagulants: Secondary | ICD-10-CM

## 2016-09-27 DIAGNOSIS — Z5181 Encounter for therapeutic drug level monitoring: Secondary | ICD-10-CM | POA: Diagnosis not present

## 2016-09-27 DIAGNOSIS — Z952 Presence of prosthetic heart valve: Secondary | ICD-10-CM

## 2016-09-27 DIAGNOSIS — Z9889 Other specified postprocedural states: Secondary | ICD-10-CM | POA: Diagnosis not present

## 2016-09-27 DIAGNOSIS — I340 Nonrheumatic mitral (valve) insufficiency: Secondary | ICD-10-CM | POA: Diagnosis not present

## 2016-09-27 LAB — POCT INR: INR: 2.2

## 2016-09-27 NOTE — Progress Notes (Signed)
He's been on warfarin for about 3 months.  I think he can stop it and resume ASA 81 daily.

## 2016-09-27 NOTE — Progress Notes (Signed)
Daily Session Note  Patient Details  Name: GWEN EDLER MRN: [id number] Date of Birth: [birth year] Referring Provider:     CARDIAC REHAB PHASE II ORIENTATION from 09/19/2016 in [place]  Referring Provider  Fransico Him MD and Loralie Champagne MD      Encounter Date: 09/27/2016  Check In:     Session Check In - 09/27/16 0705      Check-In   Location [facility]   Staff Present Andi Hence, RN, Deland Pretty, MS, ACSM CEP, Exercise Physiologist;Carlette Wilber Oliphant, RN, BSN   Supervising physician immediately available to respond to emergencies [facility] immediately available   Physician(s) Dr. Wendee Beavers   Medication changes reported     No   Fall or balance concerns reported    No   Tobacco Cessation No Change   Warm-up and Cool-down Performed as group-led instruction   Resistance Training Performed No   VAD Patient? No     Pain Assessment   Currently in Pain? No/denies      Capillary Blood Glucose: No results found for this or any previous visit (from the past 24 hour(s)).    History  Smoking Status  . Never Smoker  Smokeless Tobacco  . Never Used    Goals Met:  Exercise tolerated well Personal goals reviewed  Goals Unmet:  Not Applicable  Comments: Pt started cardiac rehab today.  Pt tolerated light exercise without difficulty. VSS, telemetry-sinus rhythm, asymptomatic.  Medication list reconciled. Pt denies barriers to medicaiton compliance.  PSYCHOSOCIAL ASSESSMENT:  PHQ-0.  Pt exhibits positive coping skills, hopeful outlook with supportive family. No psychosocial needs identified at this time, no psychosocial interventions necessary.    Pt enjoys fresh water fishing.  Pt goals for cardiac rehab are to increased stamina, decrease dyspnea.  Pt desires to resume running regimen and regular exercise routine.    Pt oriented to exercise equipment and routine.    Understanding verbalized.   Dr. Fransico Him is  Medical Director for Cardiac Rehab at [facility] ([place]).

## 2016-09-29 ENCOUNTER — Encounter (HOSPITAL_COMMUNITY)
Admission: RE | Admit: 2016-09-29 | Discharge: 2016-09-29 | Disposition: A | Discharge status: Home / Self Care | Payer: Medicaid Other | Source: Ambulatory Visit | Attending: Cardiology | Admitting: Cardiology

## 2016-09-29 DIAGNOSIS — Z952 Presence of prosthetic heart valve: Secondary | ICD-10-CM | POA: Diagnosis not present

## 2016-10-02 ENCOUNTER — Telehealth (HOSPITAL_COMMUNITY): Payer: Self-pay | Admitting: *Deleted

## 2016-10-02 ENCOUNTER — Encounter (HOSPITAL_COMMUNITY): Payer: Medicaid Other

## 2016-10-02 NOTE — Telephone Encounter (Signed)
OK to restart at this point.

## 2016-10-02 NOTE — Telephone Encounter (Signed)
Advanced Heart Failure Triage Encounter  Patient Name: Vernon Blackburn  Date of Call: 10/02/16  Problem:   Patient had previously taken Vyvance during Law school but was advises to stop taking it and he wants to ask [name] if he can restart it.  He needs it to help him focus at work but doesn't want to restart it without Dr. Claris Gladden approval.   Plan:  Will send for [name] to review.   Darron Doom, RN

## 2016-10-03 ENCOUNTER — Telehealth (HOSPITAL_COMMUNITY): Payer: Self-pay | Admitting: *Deleted

## 2016-10-03 MED ORDER — ASPIRIN EC 81 MG PO TBEC: 81.0000 mg | DELAYED_RELEASE_TABLET | Freq: Every day | ORAL | 3 refills | Status: DC

## 2016-10-03 NOTE — Telephone Encounter (Signed)
Spoke with patient this morning and he is aware to stop coumadin and restart Aspirin 81 mg daily.  Medication list has been updated.

## 2016-10-03 NOTE — Telephone Encounter (Signed)
-----   Message from Scarlette Calico, RN sent at 10/02/2016  4:00 PM EDT ----- Marykay Lex I see you have a phone note in for him today, when you talk to him can you please also let him know to stop coumadin per DM,   Progress Notes by Larey Dresser, MD at 09/25/2016 12:30 PM   Author: Larey Dresser, MD Author Type: Physician Filed: 09/27/2016 8:08 PM Note Status: Signed Cosign: Cosign Not Required Encounter Date: 09/25/2016 Editor: Larey Dresser, MD (Physician)    He's been on warfarin for about 3 months.  I think he can stop it and resume ASA 81 daily.    This note is attached to his note from Dr Ricard Dillon , thanks

## 2016-10-03 NOTE — Telephone Encounter (Signed)
Patient is aware and let the prescribing physician know he is clear to restart vyvance.

## 2016-10-04 ENCOUNTER — Encounter (HOSPITAL_COMMUNITY)
Admission: RE | Admit: 2016-10-04 | Discharge: 2016-10-04 | Disposition: A | Discharge status: Home / Self Care | Payer: Medicaid Other | Source: Ambulatory Visit | Attending: Cardiology | Admitting: Cardiology

## 2016-10-04 DIAGNOSIS — I429 Cardiomyopathy, unspecified: Secondary | ICD-10-CM | POA: Diagnosis not present

## 2016-10-04 DIAGNOSIS — Z7901 Long term (current) use of anticoagulants: Secondary | ICD-10-CM | POA: Insufficient documentation

## 2016-10-04 DIAGNOSIS — J45909 Unspecified asthma, uncomplicated: Secondary | ICD-10-CM | POA: Diagnosis not present

## 2016-10-04 DIAGNOSIS — Z8701 Personal history of pneumonia (recurrent): Secondary | ICD-10-CM | POA: Insufficient documentation

## 2016-10-04 DIAGNOSIS — I872 Venous insufficiency (chronic) (peripheral): Secondary | ICD-10-CM | POA: Insufficient documentation

## 2016-10-04 DIAGNOSIS — M1990 Unspecified osteoarthritis, unspecified site: Secondary | ICD-10-CM | POA: Diagnosis not present

## 2016-10-04 DIAGNOSIS — Z87442 Personal history of urinary calculi: Secondary | ICD-10-CM | POA: Insufficient documentation

## 2016-10-04 DIAGNOSIS — I5022 Chronic systolic (congestive) heart failure: Secondary | ICD-10-CM | POA: Insufficient documentation

## 2016-10-04 DIAGNOSIS — I272 Pulmonary hypertension, unspecified: Secondary | ICD-10-CM | POA: Insufficient documentation

## 2016-10-04 DIAGNOSIS — N189 Chronic kidney disease, unspecified: Secondary | ICD-10-CM | POA: Diagnosis not present

## 2016-10-04 DIAGNOSIS — M109 Gout, unspecified: Secondary | ICD-10-CM | POA: Diagnosis not present

## 2016-10-04 DIAGNOSIS — Z9884 Bariatric surgery status: Secondary | ICD-10-CM | POA: Insufficient documentation

## 2016-10-04 DIAGNOSIS — Z79899 Other long term (current) drug therapy: Secondary | ICD-10-CM | POA: Insufficient documentation

## 2016-10-04 DIAGNOSIS — Z952 Presence of prosthetic heart valve: Secondary | ICD-10-CM

## 2016-10-04 NOTE — Progress Notes (Signed)
Reviewed home exercise guidelines with patient including endpoints, temperature precautions, target heart rate and rate of perceived exertion. Pt is walking 30 minutes, 3 days/week as his mode of home exercise. Pt voices understanding of instructions given. Sol Passer, MS, ACSM CEP

## 2016-10-06 ENCOUNTER — Encounter (HOSPITAL_COMMUNITY): Payer: Medicaid Other

## 2016-10-09 ENCOUNTER — Encounter (HOSPITAL_COMMUNITY)
Admission: RE | Admit: 2016-10-09 | Discharge: 2016-10-09 | Disposition: A | Discharge status: Home / Self Care | Payer: Medicaid Other | Source: Ambulatory Visit | Attending: Cardiology | Admitting: Cardiology

## 2016-10-09 DIAGNOSIS — Z952 Presence of prosthetic heart valve: Secondary | ICD-10-CM

## 2016-10-10 ENCOUNTER — Encounter (HOSPITAL_COMMUNITY): Payer: Self-pay

## 2016-10-10 NOTE — Progress Notes (Signed)
Cardiac Individual Treatment Plan  Patient Details  Name: Vernon Blackburn MRN: [id number] Date of Birth: 1965/12/24 Referring Provider:     CARDIAC REHAB PHASE II ORIENTATION from 09/19/2016 in [facility]  Referring Provider  Fransico Him MD and Loralie Champagne MD      Initial Encounter Date:    CARDIAC REHAB PHASE II ORIENTATION from 09/19/2016 in [facility]  Date  09/19/16  Referring Provider  Fransico Him MD and Loralie Champagne MD      Visit Diagnosis: S/P mitral valve replacement  Patient's Home Medications on Admission:  Current Outpatient Prescriptions:  .  acetaminophen (TYLENOL) 500 MG tablet, Take 1,000 mg by mouth every 6 (six) hours as needed for moderate pain., Disp: , Rfl:  .  amLODipine (NORVASC) 10 MG tablet, Take 1 tablet (10 mg total) by mouth daily., Disp: 30 tablet, Rfl: 3 .  aspirin EC 81 MG tablet, Take 1 tablet (81 mg total) by mouth daily., Disp: 90 tablet, Rfl: 3 .  carvedilol (COREG) 12.5 MG tablet, Take 2 tablets (25 mg total) by mouth 2 (two) times daily., Disp: 60 tablet, Rfl: 1 .  cholecalciferol (VITAMIN D) 1000 units tablet, Take 1,000 Units by mouth daily., Disp: , Rfl:  .  FeAspGl-FeFum-B12-FA-C-Succ Ac (FERREX 28 PO), Take 1 tablet by mouth daily. , Disp: , Rfl:  .  Febuxostat (ULORIC) 80 MG TABS, Take 80 mg by mouth daily. , Disp: , Rfl:  .  furosemide (LASIX) 40 MG tablet, Take 40 mg (1 Tablet) in the AM and 20 mg (0.5 Tablet) in the PM., Disp: 60 tablet, Rfl: 3 .  hydrALAZINE (APRESOLINE) 100 MG tablet, Take 1 tablet (100 mg total) by mouth 3 (three) times daily., Disp: 90 tablet, Rfl: 3 .  isosorbide mononitrate (IMDUR) 60 MG 24 hr tablet, Take 1 tablet (60 mg total) by mouth daily., Disp: 30 tablet, Rfl: 3 .  Multiple Vitamin (MULTIVITAMIN WITH MINERALS) TABS tablet, Take 1 tablet by mouth 2 (two) times daily. , Disp: , Rfl:  .  potassium chloride (K-DUR,KLOR-CON) 20 MEQ tablet, Take 1  tablet (20 mEq total) by mouth daily., Disp: 30 tablet, Rfl: 3  Past Medical History: Past Medical History:  Diagnosis Date  . Anemia, chronic disease   . Arthritis    Gout  . Asthma   . Cardiomyopathy ([place])   . CHF (congestive heart failure) ([place])    a. 01/2016: echo showing EF of 20-25%, no WMA, severe MR, and PA Peak Pressure of 52 mm Hg.   Marland Kitchen Chronic systolic HF (heart failure) ([place])   . CKD (chronic kidney disease)    Dr [name]  . Elbow pain, left   . Heart murmur   . History of kidney stones   . Knee pain, bilateral   . Left ankle pain 11/04/2010   Gout  . Leg pain, bilateral   . Peripheral edema    Bilateral lower extremity   . Pneumonia    hx  . Pulmonary HTN ([place])   . S/P minimally invasive mitral valve repair 06/29/2016   30 mm [name] via right mini thoracotomy approach  . Severe mitral regurgitation   . Sleep apnea    hx bariatric  surgery 3 yrs ago lost 166 lbs  . Syncope and collapse 07/14/2016  . Venous insufficiency     Tobacco Use: History  Smoking Status  . Never Smoker  Smokeless Tobacco  . Never Used  Labs: Recent Review Flowsheet Data    Labs for ITP Cardiac and Pulmonary Rehab Latest Ref Rng & Units 06/29/2016 06/29/2016 06/29/2016 06/29/2016 06/30/2016   Hemoglobin A1c 4.8 - 5.6 % - - - - -   PHART 7.350 - 7.450 7.472(H) - 7.375 7.392 -   PCO2ART 32.0 - 48.0 mmHg 41.1 - 44.7 45.7 -   HCO3 20.0 - 28.0 mmol/L 30.4(H) - 26.2 27.9 -   TCO2 0 - 100 mmol/L 32 27 28 29 29    ACIDBASEDEF 0.0 - 2.0 mmol/L - - - - -   O2SAT % 98.0 - 99.0 99.0 -      Capillary Blood Glucose: Lab Results  Component Value Date   GLUCAP 90 07/02/2016   GLUCAP 96 07/02/2016   GLUCAP 90 07/02/2016   GLUCAP 90 07/02/2016   GLUCAP 84 07/01/2016     Exercise Target Goals:    Exercise Program Goal: Individual exercise prescription set with THRR, safety & activity barriers. Participant demonstrates ability to understand and report RPE using BORG  scale, to self-measure pulse accurately, and to acknowledge the importance of the exercise prescription.  Exercise Prescription Goal: Starting with aerobic activity 30 plus minutes a day, 3 days per week for initial exercise prescription. Provide home exercise prescription and guidelines that participant acknowledges understanding prior to discharge.  Activity Barriers & Risk Stratification:     Activity Barriers & Cardiac Risk Stratification - 09/19/16 0921      Activity Barriers & Cardiac Risk Stratification   Activity Barriers Other (comment);Muscular Weakness;Deconditioning   Comments gout L toe   Cardiac Risk Stratification High      6 Minute Walk:     6 Minute Walk    Row Name 09/19/16 0907         6 Minute Walk   Phase Initial     Distance 1738 feet     Walk Time 6 minutes     # of Rest Breaks 0     MPH 3.3     METS 4.7     RPE 9     VO2 Peak 16.6     Symptoms No     Resting HR 71 bpm     Resting BP 131/91     Resting Oxygen Saturation  100 %     Exercise Oxygen Saturation  during 6 min walk 100 %     Max Ex. HR 94 bpm     Max Ex. BP 139/96     2 Minute Post BP 114/82        Oxygen Initial Assessment:   Oxygen Re-Evaluation:   Oxygen Discharge (Final Oxygen Re-Evaluation):   Initial Exercise Prescription:     Initial Exercise Prescription - 09/19/16 0900      Date of Initial Exercise RX and Referring Provider   Date 09/19/16   Referring Provider Fransico Him MD and Loralie Champagne MD     Treadmill   MPH 3   Grade 1   Minutes 10   METs 3.71     Bike   Level 1   Minutes 10   METs 3.22     NuStep   Level 3   SPM 80   Minutes 10   METs 2.5     Prescription Details   Frequency (times per week) 3   Duration Progress to 30 minutes of continuous aerobic without signs/symptoms of physical distress     Intensity   THRR 40-80% of Max Heartrate 68-136   Ratings  of Perceived Exertion 11-13   Perceived Dyspnea 0-4     Progression    Progression Continue to progress workloads to maintain intensity without signs/symptoms of physical distress.     Resistance Training   Training Prescription Yes   Weight 3lbs   Reps 10-15      Perform Capillary Blood Glucose checks as needed.  Exercise Prescription Changes:     Exercise Prescription Changes    Row Name 10/04/16 1400             Response to Exercise   Blood Pressure (Admit) 128/84       Blood Pressure (Exercise) 120/84       Blood Pressure (Exit) 100/70       Heart Rate (Admit) 78 bpm       Heart Rate (Exercise) 106 bpm       Heart Rate (Exit) 77 bpm       Rating of Perceived Exertion (Exercise) 11       Symptoms none       Duration Continue with 30 min of aerobic exercise without signs/symptoms of physical distress.       Intensity THRR unchanged         Progression   Progression Continue to progress workloads to maintain intensity without signs/symptoms of physical distress.       Average METs 3.2         Resistance Training   Training Prescription Yes       Weight 3lbs       Reps 10-15       Time 10 Minutes         Interval Training   Interval Training No         Treadmill   MPH 3       Grade 1       Minutes 10       METs 3.71         Bike   Level 1       Minutes 10       METs 3.15         NuStep   Level 3       SPM 80       Minutes 10       METs 2.8         Home Exercise Plan   Plans to continue exercise at Home (comment)       Frequency Add 3 additional days to program exercise sessions.       Initial Home Exercises Provided 10/04/16          Exercise Comments:     Exercise Comments    Row Name 10/04/16 0800           Exercise Comments Reviewed home exercise prescription, METs and goals with patient.          Exercise Goals and Review:     Exercise Goals    Row Name 09/19/16 4259             Exercise Goals   Increase Physical Activity Yes       Intervention Provide advice, education, support and  counseling about physical activity/exercise needs.;Develop an individualized exercise prescription for aerobic and resistive training based on initial evaluation findings, risk stratification, comorbidities and participant's personal goals.       Expected Outcomes Achievement of increased cardiorespiratory fitness and enhanced flexibility, muscular endurance and strength shown through measurements of functional capacity and personal statement of participant.  Increase Strength and Stamina Yes  increase muscle mass       Intervention Provide advice, education, support and counseling about physical activity/exercise needs.;Develop an individualized exercise prescription for aerobic and resistive training based on initial evaluation findings, risk stratification, comorbidities and participant's personal goals.       Expected Outcomes Achievement of increased cardiorespiratory fitness and enhanced flexibility, muscular endurance and strength shown through measurements of functional capacity and personal statement of participant.       Able to understand and use rate of perceived exertion (RPE) scale Yes       Intervention Provide education and explanation on how to use RPE scale       Expected Outcomes Short Term: Able to use RPE daily in rehab to express subjective intensity level;Long Term:  Able to use RPE to guide intensity level when exercising independently       Knowledge and understanding of Target Heart Rate Range (THRR) Yes       Intervention Provide education and explanation of THRR including how the numbers were predicted and where they are located for reference       Expected Outcomes Short Term: Able to state/look up THRR;Long Term: Able to use THRR to govern intensity when exercising independently;Short Term: Able to use daily as guideline for intensity in rehab       Able to check pulse independently Yes       Intervention Provide education and demonstration on how to check pulse in  carotid and radial arteries.;Review the importance of being able to check your own pulse for safety during independent exercise       Expected Outcomes Short Term: Able to explain why pulse checking is important during independent exercise;Long Term: Able to check pulse independently and accurately       Understanding of Exercise Prescription Yes       Intervention Provide education, explanation, and written materials on patient's individual exercise prescription       Expected Outcomes Short Term: Able to explain program exercise prescription;Long Term: Able to explain home exercise prescription to exercise independently          Exercise Goals Re-Evaluation :     Exercise Goals Re-Evaluation    Row Name 10/04/16 1358             Exercise Goal Re-Evaluation   Exercise Goals Review Understanding of Exercise Prescription;Increase Physical Activity;Knowledge and understanding of Target Heart Rate Range (THRR);Able to understand and use rate of perceived exertion (RPE) scale       Comments Reviewed home exercies prescription with patient including THRR, RPE scale and end points for exercise. Pt is walking 30 minutes, 3 days/week in addition to exercise at cardiac rehab.       Expected Outcomes Continue exercise at cardiac rehab and home 30 minutes 6 days/week.           Discharge Exercise Prescription (Final Exercise Prescription Changes):     Exercise Prescription Changes - 10/04/16 1400      Response to Exercise   Blood Pressure (Admit) 128/84   Blood Pressure (Exercise) 120/84   Blood Pressure (Exit) 100/70   Heart Rate (Admit) 78 bpm   Heart Rate (Exercise) 106 bpm   Heart Rate (Exit) 77 bpm   Rating of Perceived Exertion (Exercise) 11   Symptoms none   Duration Continue with 30 min of aerobic exercise without signs/symptoms of physical distress.   Intensity THRR unchanged     Progression   Progression  Continue to progress workloads to maintain intensity without  signs/symptoms of physical distress.   Average METs 3.2     Resistance Training   Training Prescription Yes   Weight 3lbs   Reps 10-15   Time 10 Minutes     Interval Training   Interval Training No     Treadmill   MPH 3   Grade 1   Minutes 10   METs 3.71     Bike   Level 1   Minutes 10   METs 3.15     NuStep   Level 3   SPM 80   Minutes 10   METs 2.8     Home Exercise Plan   Plans to continue exercise at Home (comment)   Frequency Add 3 additional days to program exercise sessions.   Initial Home Exercises Provided 10/04/16      Nutrition:  Target Goals: Understanding of nutrition guidelines, daily intake of sodium 1500mg , cholesterol 200mg , calories 30% from fat and 7% or less from saturated fats, daily to have 5 or more servings of fruits and vegetables.  Biometrics:      Post Biometrics - 09/19/16 0912       Post  Biometrics   Height 5\' 9"  (1.753 m)   Weight 190 lb 4.1 oz (86.3 kg)   Waist Circumference 37.5 inches   Hip Circumference 39.75 inches   Waist to Hip Ratio 0.94 %   BMI (Calculated) 28.08   Triceps Skinfold 12 mm   % Body Fat 24.9 %   Grip Strength 46 kg   Flexibility 13 in   Single Leg Stand 15 seconds      Nutrition Therapy Plan and Nutrition Goals:   Nutrition Discharge: Nutrition Scores:   Nutrition Goals Re-Evaluation:   Nutrition Goals Re-Evaluation:   Nutrition Goals Discharge (Final Nutrition Goals Re-Evaluation):   Psychosocial: Target Goals: Acknowledge presence or absence of significant depression and/or stress, maximize coping skills, provide positive support system. Participant is able to verbalize types and ability to use techniques and skills needed for reducing stress and depression.  Initial Review & Psychosocial Screening:     Initial Psych Review & Screening - 09/19/16 0900      Initial Review   Current issues with Current Stress Concerns   Source of Stress Concerns Family  divorce      Family  Dynamics   Good Support System? Yes  friend      Barriers   Psychosocial barriers to participate in program The patient should benefit from training in stress management and relaxation.     Screening Interventions   Interventions Encouraged to exercise;Provide feedback about the scores to participant      Quality of Life Scores:     Quality of Life - 09/29/16 1057      Quality of Life Scores   Health/Function Pre 24.4 %   Socioeconomic Pre 26.1 %   Psych/Spiritual Pre 28.2 %   Family Pre 20.4 %  adult children live out of town, recent divorce   GLOBAL Pre 25 %  QOL scores reviewed with pt.  Despite current life stressors, pt exhibits positive outlook with good coping skills. pt active at work, current fellowship with fraternity and maintains contact with adult children.  pt considering Divorce Care support grou      PHQ-9: Recent Review Flowsheet Data    Depression screen [facility] 2/9 09/27/2016   Decreased Interest 0   Down, Depressed, Hopeless 0   PHQ - 2 Score  0     Interpretation of Total Score  Total Score Depression Severity:  1-4 = Minimal depression, 5-9 = Mild depression, 10-14 = Moderate depression, 15-19 = Moderately severe depression, 20-27 = Severe depression   Psychosocial Evaluation and Intervention:     Psychosocial Evaluation - 09/27/16 1108      Psychosocial Evaluation & Interventions   Interventions Stress management education;Relaxation education;Encouraged to exercise with the program and follow exercise prescription   Comments pt with identified life transition stressors.  pt appears to manage stress appropriately.  will continue to monitor.   Expected Outcomes pt will exhibit positive outlook with good coping skills.    Continue Psychosocial Services  No Follow up required      Psychosocial Re-Evaluation:     Psychosocial Re-Evaluation    [place] Name 10/10/16 1524             Psychosocial Re-Evaluation   Current issues with Current Stress  Concerns       Comments pt with life transition stressors and high stress profession.  overall pt demonstrates positive outlook, good coping skills and support people.        Expected Outcomes pt will exhibit improved stress management techniques, positive outlook with good coping skill.s        Interventions Stress management education;Relaxation education;Encouraged to attend Cardiac Rehabilitation for the exercise       Continue Psychosocial Services  Follow up required by staff         Initial Review   Source of Stress Concerns Family;Occupation          Psychosocial Discharge (Final Psychosocial Re-Evaluation):     Psychosocial Re-Evaluation - 10/10/16 1524      Psychosocial Re-Evaluation   Current issues with Current Stress Concerns   Comments pt with life transition stressors and high stress profession.  overall pt demonstrates positive outlook, good coping skills and support people.    Expected Outcomes pt will exhibit improved stress management techniques, positive outlook with good coping skill.s    Interventions Stress management education;Relaxation education;Encouraged to attend Cardiac Rehabilitation for the exercise   Continue Psychosocial Services  Follow up required by staff     Initial Review   Source of Stress Concerns Family;Occupation      Vocational Rehabilitation: Provide vocational rehab assistance to qualifying candidates.   Vocational Rehab Evaluation & Intervention:     Vocational Rehab - 09/19/16 0859      Initial Vocational Rehab Evaluation & Intervention   Assessment shows need for Vocational Rehabilitation No      Education: Education Goals: Education classes will be provided on a weekly basis, covering required topics. Participant will state understanding/return demonstration of topics presented.  Learning Barriers/Preferences:     Learning Barriers/Preferences - 09/19/16 0920      Learning Barriers/Preferences   Learning Barriers  Sight   Learning Preferences Written Material;Video;Skilled Demonstration      Education Topics: Count Your Pulse:  -Group instruction provided by verbal instruction, demonstration, patient participation and written materials to support subject.  Instructors address importance of being able to find your pulse and how to count your pulse when at home without a heart monitor.  Patients get hands on experience counting their pulse with staff help and individually.   Heart Attack, Angina, and Risk Factor Modification:  -Group instruction provided by verbal instruction, video, and written materials to support subject.  Instructors address signs and symptoms of angina and heart attacks.    Also discuss risk factors for  heart disease and how to make changes to improve heart health risk factors.   Functional Fitness:  -Group instruction provided by verbal instruction, demonstration, patient participation, and written materials to support subject.  Instructors address safety measures for doing things around the house.  Discuss how to get up and down off the floor, how to pick things up properly, how to safely get out of a chair without assistance, and balance training.   Meditation and Mindfulness:  -Group instruction provided by verbal instruction, patient participation, and written materials to support subject.  Instructor addresses importance of mindfulness and meditation practice to help reduce stress and improve awareness.  Instructor also leads participants through a meditation exercise.    Stretching for Flexibility and Mobility:  -Group instruction provided by verbal instruction, patient participation, and written materials to support subject.  Instructors lead participants through series of stretches that are designed to increase flexibility thus improving mobility.  These stretches are additional exercise for major muscle groups that are typically performed during regular warm up and cool  down.   Hands Only CPR:  -Group verbal, video, and participation provides a basic overview of AHA guidelines for community CPR. Role-play of emergencies allow participants the opportunity to practice calling for help and chest compression technique with discussion of AED use.   Hypertension: -Group verbal and written instruction that provides a basic overview of hypertension including the most recent diagnostic guidelines, risk factor reduction with self-care instructions and medication management.    Nutrition I class: Heart Healthy Eating:  -Group instruction provided by PowerPoint slides, verbal discussion, and written materials to support subject matter. The instructor gives an explanation and review of the Therapeutic Lifestyle Changes diet recommendations, which includes a discussion on lipid goals, dietary fat, sodium, fiber, plant stanol/sterol esters, sugar, and the components of a well-balanced, healthy diet.   Nutrition II class: Lifestyle Skills:  -Group instruction provided by PowerPoint slides, verbal discussion, and written materials to support subject matter. The instructor gives an explanation and review of label reading, grocery shopping for heart health, heart healthy recipe modifications, and ways to make healthier choices when eating out.   Diabetes Question & Answer:  -Group instruction provided by PowerPoint slides, verbal discussion, and written materials to support subject matter. The instructor gives an explanation and review of diabetes co-morbidities, pre- and post-prandial blood glucose goals, pre-exercise blood glucose goals, signs, symptoms, and treatment of hypoglycemia and hyperglycemia, and foot care basics.   Diabetes Blitz:  -Group instruction provided by PowerPoint slides, verbal discussion, and written materials to support subject matter. The instructor gives an explanation and review of the physiology behind type 1 and type 2 diabetes, diabetes  medications and rational behind using different medications, pre- and post-prandial blood glucose recommendations and Hemoglobin A1c goals, diabetes diet, and exercise including blood glucose guidelines for exercising safely.    Portion Distortion:  -Group instruction provided by PowerPoint slides, verbal discussion, written materials, and food models to support subject matter. The instructor gives an explanation of serving size versus portion size, changes in portions sizes over the last 20 years, and what consists of a serving from each food group.   Stress Management:  -Group instruction provided by verbal instruction, video, and written materials to support subject matter.  Instructors review role of stress in heart disease and how to cope with stress positively.     Exercising on Your Own:  -Group instruction provided by verbal instruction, power point, and written materials to support subject.  Instructors  discuss benefits of exercise, components of exercise, frequency and intensity of exercise, and end points for exercise.  Also discuss use of nitroglycerin and activating EMS.  Review options of places to exercise outside of rehab.  Review guidelines for sex with heart disease.   Cardiac Drugs I:  -Group instruction provided by verbal instruction and written materials to support subject.  Instructor reviews cardiac drug classes: antiplatelets, anticoagulants, beta blockers, and statins.  Instructor discusses reasons, side effects, and lifestyle considerations for each drug class.   Cardiac Drugs II:  -Group instruction provided by verbal instruction and written materials to support subject.  Instructor reviews cardiac drug classes: angiotensin converting enzyme inhibitors (ACE-I), angiotensin II receptor blockers (ARBs), nitrates, and calcium channel blockers.  Instructor discusses reasons, side effects, and lifestyle considerations for each drug class.   Anatomy and Physiology of the  Circulatory System:  Group verbal and written instruction and models provide basic cardiac anatomy and physiology, with the coronary electrical and arterial systems. Review of: AMI, Angina, Valve disease, Heart Failure, Peripheral Artery Disease, Cardiac Arrhythmia, Pacemakers, and the ICD.   Other Education:  -Group or individual verbal, written, or video instructions that support the educational goals of the cardiac rehab program.   Knowledge Questionnaire Score:     Knowledge Questionnaire Score - 09/19/16 0858      Knowledge Questionnaire Score   Pre Score 21/24      Core Components/Risk Factors/Patient Goals at Admission:     Personal Goals and Risk Factors at Admission - 09/19/16 1033      Core Components/Risk Factors/Patient Goals on Admission   Admit Weight 190 lb 4.1 oz (86.3 kg)      Core Components/Risk Factors/Patient Goals Review:      Goals and Risk Factor Review    Row Name 10/10/16 1522             Core Components/Risk Factors/Patient Goals Review   Personal Goals Review Weight Management/Obesity;Heart Failure;Hypertension;Stress       Review pt demonstrates eagerness to participate in CR offerings. pt diastolic BP elevated at CR at rest and with exercise. Cardiologist made aware.  pt with high stress profession and family situational stress.        Expected Outcomes pt will participate in CR exercise, nutrition and lifestyle modification opportunities to decrease overall RF.            Core Components/Risk Factors/Patient Goals at Discharge (Final Review):      Goals and Risk Factor Review - 10/10/16 1522      Core Components/Risk Factors/Patient Goals Review   Personal Goals Review Weight Management/Obesity;Heart Failure;Hypertension;Stress   Review pt demonstrates eagerness to participate in CR offerings. pt diastolic BP elevated at CR at rest and with exercise. Cardiologist made aware.  pt with high stress profession and family situational  stress.    Expected Outcomes pt will participate in CR exercise, nutrition and lifestyle modification opportunities to decrease overall RF.        ITP Comments:     ITP Comments    Row Name 09/19/16 0748 10/10/16 1521         ITP Comments Dr. Fransico Him, Medical Director  30 day ITP review.  Pt with good participation and attendance.  No change in current regimen unless directed by Medical Director.           Comments:

## 2016-10-11 ENCOUNTER — Encounter (HOSPITAL_COMMUNITY): Payer: Medicaid Other

## 2016-10-13 ENCOUNTER — Encounter (HOSPITAL_COMMUNITY): Payer: Medicaid Other

## 2016-10-13 ENCOUNTER — Other Ambulatory Visit (HOSPITAL_COMMUNITY): Payer: Self-pay | Admitting: Cardiology

## 2016-10-16 ENCOUNTER — Encounter (HOSPITAL_COMMUNITY): Payer: Medicaid Other

## 2016-10-18 ENCOUNTER — Encounter (HOSPITAL_COMMUNITY): Admission: RE | Admit: 2016-10-18 | Payer: Medicaid Other | Source: Ambulatory Visit

## 2016-10-20 ENCOUNTER — Encounter (HOSPITAL_COMMUNITY): Payer: Medicaid Other

## 2016-10-23 ENCOUNTER — Encounter (HOSPITAL_COMMUNITY): Payer: Medicaid Other

## 2016-10-25 ENCOUNTER — Encounter (HOSPITAL_COMMUNITY)
Admission: RE | Admit: 2016-10-25 | Discharge: 2016-10-25 | Disposition: A | Discharge status: Home / Self Care | Payer: Medicaid Other | Source: Ambulatory Visit | Attending: Cardiology | Admitting: Cardiology

## 2016-10-25 DIAGNOSIS — Z952 Presence of prosthetic heart valve: Secondary | ICD-10-CM

## 2016-10-27 ENCOUNTER — Encounter (HOSPITAL_COMMUNITY): Payer: Medicaid Other

## 2016-10-30 ENCOUNTER — Encounter (HOSPITAL_COMMUNITY): Payer: Medicaid Other

## 2016-11-01 ENCOUNTER — Encounter (HOSPITAL_COMMUNITY)
Admission: RE | Admit: 2016-11-01 | Discharge: 2016-11-01 | Disposition: A | Discharge status: Home / Self Care | Payer: Medicaid Other | Source: Ambulatory Visit | Attending: Cardiology | Admitting: Cardiology

## 2016-11-03 ENCOUNTER — Encounter (HOSPITAL_COMMUNITY): Payer: Medicaid Other

## 2016-11-03 NOTE — Progress Notes (Signed)
Cardiac Individual Treatment Plan  Patient Details  Name: Vernon Blackburn MRN: [id number] Date of Birth: Feb 09, 1965 Referring Provider:     CARDIAC REHAB PHASE II ORIENTATION from 09/19/2016 in [place]  Referring Provider  Fransico Him MD and Loralie Champagne MD      Initial Encounter Date:    CARDIAC REHAB PHASE II ORIENTATION from 09/19/2016 in [facility]  Date  09/19/16  Referring Provider  Fransico Him MD and Loralie Champagne MD      Visit Diagnosis: S/P mitral valve replacement  Patient's Home Medications on Admission:  Current Outpatient Prescriptions:  .  acetaminophen (TYLENOL) 500 MG tablet, Take 1,000 mg by mouth every 6 (six) hours as needed for moderate pain., Disp: , Rfl:  .  amLODipine (NORVASC) 10 MG tablet, Take 1 tablet (10 mg total) by mouth daily., Disp: 30 tablet, Rfl: 3 .  aspirin EC 81 MG tablet, Take 1 tablet (81 mg total) by mouth daily., Disp: 90 tablet, Rfl: 3 .  carvedilol (COREG) 12.5 MG tablet, Take 2 tablets (25 mg total) by mouth 2 (two) times daily., Disp: 60 tablet, Rfl: 1 .  cholecalciferol (VITAMIN D) 1000 units tablet, Take 1,000 Units by mouth daily., Disp: , Rfl:  .  FeAspGl-FeFum-B12-FA-C-Succ Ac (FERREX 28 PO), Take 1 tablet by mouth daily. , Disp: , Rfl:  .  Febuxostat (ULORIC) 80 MG TABS, Take 80 mg by mouth daily. , Disp: , Rfl:  .  furosemide (LASIX) 40 MG tablet, Take 40 mg (1 Tablet) in the AM and 20 mg (0.5 Tablet) in the PM., Disp: 60 tablet, Rfl: 3 .  hydrALAZINE (APRESOLINE) 100 MG tablet, Take 1 tablet (100 mg total) by mouth 3 (three) times daily., Disp: 90 tablet, Rfl: 3 .  isosorbide mononitrate (IMDUR) 60 MG 24 hr tablet, Take 1 tablet (60 mg total) by mouth daily., Disp: 30 tablet, Rfl: 3 .  Multiple Vitamin (MULTIVITAMIN WITH MINERALS) TABS tablet, Take 1 tablet by mouth 2 (two) times daily. , Disp: , Rfl:  .  potassium chloride (K-DUR,KLOR-CON) 20 MEQ tablet, Take 1  tablet (20 mEq total) by mouth daily., Disp: 30 tablet, Rfl: 3  Past Medical History: Past Medical History:  Diagnosis Date  . Anemia, chronic disease   . Arthritis    Gout  . Asthma   . Cardiomyopathy ([place])   . CHF (congestive heart failure) ([place])    a. 01/2016: echo showing EF of 20-25%, no WMA, severe MR, and PA Peak Pressure of 52 mm Hg.   Marland Kitchen Chronic systolic HF (heart failure) ([place])   . CKD (chronic kidney disease)    Dr [name]  . Elbow pain, left   . Heart murmur   . History of kidney stones   . Knee pain, bilateral   . Left ankle pain 11/04/2010   Gout  . Leg pain, bilateral   . Peripheral edema    Bilateral lower extremity   . Pneumonia    hx  . Pulmonary HTN ([place])   . S/P minimally invasive mitral valve repair 06/29/2016   30 mm [name] via right mini thoracotomy approach  . Severe mitral regurgitation   . Sleep apnea    hx bariatric  surgery 3 yrs ago lost 166 lbs  . Syncope and collapse 07/14/2016  . Venous insufficiency     Tobacco Use: History  Smoking Status  . Never Smoker  Smokeless Tobacco  . Never Used  Labs: Recent Review Flowsheet Data    Labs for ITP Cardiac and Pulmonary Rehab Latest Ref Rng & Units 06/29/2016 06/29/2016 06/29/2016 06/29/2016 06/30/2016   Hemoglobin A1c 4.8 - 5.6 % - - - - -   PHART 7.350 - 7.450 7.472(H) - 7.375 7.392 -   PCO2ART 32.0 - 48.0 mmHg 41.1 - 44.7 45.7 -   HCO3 20.0 - 28.0 mmol/L 30.4(H) - 26.2 27.9 -   TCO2 0 - 100 mmol/L 32 27 28 29 29    ACIDBASEDEF 0.0 - 2.0 mmol/L - - - - -   O2SAT % 98.0 - 99.0 99.0 -      Capillary Blood Glucose: Lab Results  Component Value Date   GLUCAP 90 07/02/2016   GLUCAP 96 07/02/2016   GLUCAP 90 07/02/2016   GLUCAP 90 07/02/2016   GLUCAP 84 07/01/2016     Exercise Target Goals:    Exercise Program Goal: Individual exercise prescription set with THRR, safety & activity barriers. Participant demonstrates ability to understand and report RPE using BORG  scale, to self-measure pulse accurately, and to acknowledge the importance of the exercise prescription.  Exercise Prescription Goal: Starting with aerobic activity 30 plus minutes a day, 3 days per week for initial exercise prescription. Provide home exercise prescription and guidelines that participant acknowledges understanding prior to discharge.  Activity Barriers & Risk Stratification:     Activity Barriers & Cardiac Risk Stratification - 09/19/16 0921      Activity Barriers & Cardiac Risk Stratification   Activity Barriers Other (comment);Muscular Weakness;Deconditioning   Comments gout L toe   Cardiac Risk Stratification High      6 Minute Walk:     6 Minute Walk    Row Name 09/19/16 0907         6 Minute Walk   Phase Initial     Distance 1738 feet     Walk Time 6 minutes     # of Rest Breaks 0     MPH 3.3     METS 4.7     RPE 9     VO2 Peak 16.6     Symptoms No     Resting HR 71 bpm     Resting BP 131/91     Resting Oxygen Saturation  100 %     Exercise Oxygen Saturation  during 6 min walk 100 %     Max Ex. HR 94 bpm     Max Ex. BP 139/96     2 Minute Post BP 114/82        Oxygen Initial Assessment:   Oxygen Re-Evaluation:   Oxygen Discharge (Final Oxygen Re-Evaluation):   Initial Exercise Prescription:     Initial Exercise Prescription - 09/19/16 0900      Date of Initial Exercise RX and Referring Provider   Date 09/19/16   Referring Provider Fransico Him MD and Loralie Champagne MD     Treadmill   MPH 3   Grade 1   Minutes 10   METs 3.71     Bike   Level 1   Minutes 10   METs 3.22     NuStep   Level 3   SPM 80   Minutes 10   METs 2.5     Prescription Details   Frequency (times per week) 3   Duration Progress to 30 minutes of continuous aerobic without signs/symptoms of physical distress     Intensity   THRR 40-80% of Max Heartrate 68-136   Ratings  of Perceived Exertion 11-13   Perceived Dyspnea 0-4     Progression    Progression Continue to progress workloads to maintain intensity without signs/symptoms of physical distress.     Resistance Training   Training Prescription Yes   Weight 3lbs   Reps 10-15      Perform Capillary Blood Glucose checks as needed.  Exercise Prescription Changes:      Exercise Prescription Changes    Row Name 10/04/16 1400 10/25/16 0654           Response to Exercise   Blood Pressure (Admit) 128/84 130/82      Blood Pressure (Exercise) 120/84 128/88      Blood Pressure (Exit) 100/70 138/90      Heart Rate (Admit) 78 bpm 94 bpm      Heart Rate (Exercise) 106 bpm 112 bpm      Heart Rate (Exit) 77 bpm 92 bpm      Rating of Perceived Exertion (Exercise) 11 12      Symptoms none none      Duration Continue with 30 min of aerobic exercise without signs/symptoms of physical distress. Continue with 30 min of aerobic exercise without signs/symptoms of physical distress.      Intensity THRR unchanged THRR unchanged        Progression   Progression Continue to progress workloads to maintain intensity without signs/symptoms of physical distress. Continue to progress workloads to maintain intensity without signs/symptoms of physical distress.      Average METs 3.2 3        Resistance Training   Training Prescription Yes Yes      Weight 3lbs 3lbs      Reps 10-15 10-15      Time 10 Minutes 10 Minutes        Interval Training   Interval Training No No        Treadmill   MPH 3 3      Grade 1 1      Minutes 10 10      METs 3.71 3.71        Bike   Level 1 1      Minutes 10 10      METs 3.15 3.12        NuStep   Level 3 3      SPM 80 80      Minutes 10 10      METs 2.8 2.2        Home Exercise Plan   Plans to continue exercise at Home (comment) Home (comment)      Frequency Add 3 additional days to program exercise sessions. Add 3 additional days to program exercise sessions.      Initial Home Exercises Provided 10/04/16 10/04/16         Exercise  Comments:      Exercise Comments    Row Name 10/04/16 0800 11/03/16 1156         Exercise Comments Reviewed home exercise prescription, METs and goals with patient. Patient has been out the past week.         Exercise Goals and Review:      Exercise Goals    Row Name 09/19/16 0814             Exercise Goals   Increase Physical Activity Yes       Intervention Provide advice, education, support and counseling about physical activity/exercise needs.;Develop an individualized exercise prescription for aerobic and resistive training  based on initial evaluation findings, risk stratification, comorbidities and participant's personal goals.       Expected Outcomes Achievement of increased cardiorespiratory fitness and enhanced flexibility, muscular endurance and strength shown through measurements of functional capacity and personal statement of participant.       Increase Strength and Stamina Yes  increase muscle mass       Intervention Provide advice, education, support and counseling about physical activity/exercise needs.;Develop an individualized exercise prescription for aerobic and resistive training based on initial evaluation findings, risk stratification, comorbidities and participant's personal goals.       Expected Outcomes Achievement of increased cardiorespiratory fitness and enhanced flexibility, muscular endurance and strength shown through measurements of functional capacity and personal statement of participant.       Able to understand and use rate of perceived exertion (RPE) scale Yes       Intervention Provide education and explanation on how to use RPE scale       Expected Outcomes Short Term: Able to use RPE daily in rehab to express subjective intensity level;Long Term:  Able to use RPE to guide intensity level when exercising independently       Knowledge and understanding of Target Heart Rate Range (THRR) Yes       Intervention Provide education and explanation  of THRR including how the numbers were predicted and where they are located for reference       Expected Outcomes Short Term: Able to state/look up THRR;Long Term: Able to use THRR to govern intensity when exercising independently;Short Term: Able to use daily as guideline for intensity in rehab       Able to check pulse independently Yes       Intervention Provide education and demonstration on how to check pulse in carotid and radial arteries.;Review the importance of being able to check your own pulse for safety during independent exercise       Expected Outcomes Short Term: Able to explain why pulse checking is important during independent exercise;Long Term: Able to check pulse independently and accurately       Understanding of Exercise Prescription Yes       Intervention Provide education, explanation, and written materials on patient's individual exercise prescription       Expected Outcomes Short Term: Able to explain program exercise prescription;Long Term: Able to explain home exercise prescription to exercise independently          Exercise Goals Re-Evaluation :     Exercise Goals Re-Evaluation    Row Name 10/04/16 1358 11/03/16 1157           Exercise Goal Re-Evaluation   Exercise Goals Review Understanding of Exercise Prescription;Increase Physical Activity;Knowledge and understanding of Target Heart Rate Range (THRR);Able to understand and use rate of perceived exertion (RPE) scale  -      Comments Reviewed home exercies prescription with patient including THRR, RPE scale and end points for exercise. Pt is walking 30 minutes, 3 days/week in addition to exercise at cardiac rehab. Patient has been out the past week, unable to reevalute goals.      Expected Outcomes Continue exercise at cardiac rehab and home 30 minutes 6 days/week. Continue exercise at cardiac rehab and home 30 minutes 6 days/week.          Discharge Exercise Prescription (Final Exercise Prescription  Changes):     Exercise Prescription Changes - 10/25/16 0654      Response to Exercise   Blood Pressure (Admit) 130/82  Blood Pressure (Exercise) 128/88   Blood Pressure (Exit) 138/90   Heart Rate (Admit) 94 bpm   Heart Rate (Exercise) 112 bpm   Heart Rate (Exit) 92 bpm   Rating of Perceived Exertion (Exercise) 12   Symptoms none   Duration Continue with 30 min of aerobic exercise without signs/symptoms of physical distress.   Intensity THRR unchanged     Progression   Progression Continue to progress workloads to maintain intensity without signs/symptoms of physical distress.   Average METs 3     Resistance Training   Training Prescription Yes   Weight 3lbs   Reps 10-15   Time 10 Minutes     Interval Training   Interval Training No     Treadmill   MPH 3   Grade 1   Minutes 10   METs 3.71     Bike   Level 1   Minutes 10   METs 3.12     NuStep   Level 3   SPM 80   Minutes 10   METs 2.2     Home Exercise Plan   Plans to continue exercise at Home (comment)   Frequency Add 3 additional days to program exercise sessions.   Initial Home Exercises Provided 10/04/16      Nutrition:  Target Goals: Understanding of nutrition guidelines, daily intake of sodium 1500mg , cholesterol 200mg , calories 30% from fat and 7% or less from saturated fats, daily to have 5 or more servings of fruits and vegetables.  Biometrics:      Post Biometrics - 09/19/16 0912       Post  Biometrics   Height 5\' 9"  (1.753 m)   Weight 190 lb 4.1 oz (86.3 kg)   Waist Circumference 37.5 inches   Hip Circumference 39.75 inches   Waist to Hip Ratio 0.94 %   BMI (Calculated) 28.08   Triceps Skinfold 12 mm   % Body Fat 24.9 %   Grip Strength 46 kg   Flexibility 13 in   Single Leg Stand 15 seconds      Nutrition Therapy Plan and Nutrition Goals:   Nutrition Discharge: Nutrition Scores:   Nutrition Goals Re-Evaluation:   Nutrition Goals Re-Evaluation:   Nutrition Goals  Discharge (Final Nutrition Goals Re-Evaluation):   Psychosocial: Target Goals: Acknowledge presence or absence of significant depression and/or stress, maximize coping skills, provide positive support system. Participant is able to verbalize types and ability to use techniques and skills needed for reducing stress and depression.  Initial Review & Psychosocial Screening:     Initial Psych Review & Screening - 09/19/16 0900      Initial Review   Current issues with Current Stress Concerns   Source of Stress Concerns Family  divorce      Family Dynamics   Good Support System? Yes  friend      Barriers   Psychosocial barriers to participate in program The patient should benefit from training in stress management and relaxation.     Screening Interventions   Interventions Encouraged to exercise;Provide feedback about the scores to participant      Quality of Life Scores:     Quality of Life - 09/29/16 1057      Quality of Life Scores   Health/Function Pre 24.4 %   Socioeconomic Pre 26.1 %   Psych/Spiritual Pre 28.2 %   Family Pre 20.4 %  adult children live out of town, recent divorce   GLOBAL Pre 25 %  QOL scores reviewed with  pt.  Despite current life stressors, pt exhibits positive outlook with good coping skills. pt active at work, current fellowship with fraternity and maintains contact with adult children.  pt considering Divorce Care support grou      PHQ-9: Recent Review Flowsheet Data    Depression screen [facility] 2/9 09/27/2016   Decreased Interest 0   Down, Depressed, Hopeless 0   PHQ - 2 Score 0     Interpretation of Total Score  Total Score Depression Severity:  1-4 = Minimal depression, 5-9 = Mild depression, 10-14 = Moderate depression, 15-19 = Moderately severe depression, 20-27 = Severe depression   Psychosocial Evaluation and Intervention:     Psychosocial Evaluation - 09/27/16 1108      Psychosocial Evaluation & Interventions   Interventions  Stress management education;Relaxation education;Encouraged to exercise with the program and follow exercise prescription   Comments pt with identified life transition stressors.  pt appears to manage stress appropriately.  will continue to monitor.   Expected Outcomes pt will exhibit positive outlook with good coping skills.    Continue Psychosocial Services  No Follow up required      Psychosocial Re-Evaluation:     Psychosocial Re-Evaluation    [place] Name 10/10/16 1524 10/31/16 0745           Psychosocial Re-Evaluation   Current issues with Current Stress Concerns Current Stress Concerns      Comments pt with life transition stressors and high stress profession.  overall pt demonstrates positive outlook, good coping skills and support people.  pt with life transition stressors and high stress profession.  overall pt demonstrates positive outlook, good coping skills and support people.       Expected Outcomes pt will exhibit improved stress management techniques, positive outlook with good coping skill.s  pt will exhibit improved stress management techniques, positive outlook with good coping skill.s       Interventions Stress management education;Relaxation education;Encouraged to attend Cardiac Rehabilitation for the exercise Stress management education;Relaxation education;Encouraged to attend Cardiac Rehabilitation for the exercise      Continue Psychosocial Services  Follow up required by staff Follow up required by staff        Initial Review   Source of Stress Concerns Family;Occupation Family;Occupation         Psychosocial Discharge (Final Psychosocial Re-Evaluation):     Psychosocial Re-Evaluation - 10/31/16 0745      Psychosocial Re-Evaluation   Current issues with Current Stress Concerns   Comments pt with life transition stressors and high stress profession.  overall pt demonstrates positive outlook, good coping skills and support people.    Expected Outcomes pt will  exhibit improved stress management techniques, positive outlook with good coping skill.s    Interventions Stress management education;Relaxation education;Encouraged to attend Cardiac Rehabilitation for the exercise   Continue Psychosocial Services  Follow up required by staff     Initial Review   Source of Stress Concerns Family;Occupation      Vocational Rehabilitation: Provide vocational rehab assistance to qualifying candidates.   Vocational Rehab Evaluation & Intervention:     Vocational Rehab - 09/19/16 0859      Initial Vocational Rehab Evaluation & Intervention   Assessment shows need for Vocational Rehabilitation No      Education: Education Goals: Education classes will be provided on a weekly basis, covering required topics. Participant will state understanding/return demonstration of topics presented.  Learning Barriers/Preferences:     Learning Barriers/Preferences - 09/19/16 0920  Learning Barriers/Preferences   Learning Barriers Sight   Learning Preferences Written Material;Video;Skilled Demonstration      Education Topics: Count Your Pulse:  -Group instruction provided by verbal instruction, demonstration, patient participation and written materials to support subject.  Instructors address importance of being able to find your pulse and how to count your pulse when at home without a heart monitor.  Patients get hands on experience counting their pulse with staff help and individually.   Heart Attack, Angina, and Risk Factor Modification:  -Group instruction provided by verbal instruction, video, and written materials to support subject.  Instructors address signs and symptoms of angina and heart attacks.    Also discuss risk factors for heart disease and how to make changes to improve heart health risk factors.   Functional Fitness:  -Group instruction provided by verbal instruction, demonstration, patient participation, and written materials to  support subject.  Instructors address safety measures for doing things around the house.  Discuss how to get up and down off the floor, how to pick things up properly, how to safely get out of a chair without assistance, and balance training.   Meditation and Mindfulness:  -Group instruction provided by verbal instruction, patient participation, and written materials to support subject.  Instructor addresses importance of mindfulness and meditation practice to help reduce stress and improve awareness.  Instructor also leads participants through a meditation exercise.    Stretching for Flexibility and Mobility:  -Group instruction provided by verbal instruction, patient participation, and written materials to support subject.  Instructors lead participants through series of stretches that are designed to increase flexibility thus improving mobility.  These stretches are additional exercise for major muscle groups that are typically performed during regular warm up and cool down.   Hands Only CPR:  -Group verbal, video, and participation provides a basic overview of AHA guidelines for community CPR. Role-play of emergencies allow participants the opportunity to practice calling for help and chest compression technique with discussion of AED use.   Hypertension: -Group verbal and written instruction that provides a basic overview of hypertension including the most recent diagnostic guidelines, risk factor reduction with self-care instructions and medication management.    Nutrition I class: Heart Healthy Eating:  -Group instruction provided by PowerPoint slides, verbal discussion, and written materials to support subject matter. The instructor gives an explanation and review of the Therapeutic Lifestyle Changes diet recommendations, which includes a discussion on lipid goals, dietary fat, sodium, fiber, plant stanol/sterol esters, sugar, and the components of a well-balanced, healthy  diet.   Nutrition II class: Lifestyle Skills:  -Group instruction provided by PowerPoint slides, verbal discussion, and written materials to support subject matter. The instructor gives an explanation and review of label reading, grocery shopping for heart health, heart healthy recipe modifications, and ways to make healthier choices when eating out.   Diabetes Question & Answer:  -Group instruction provided by PowerPoint slides, verbal discussion, and written materials to support subject matter. The instructor gives an explanation and review of diabetes co-morbidities, pre- and post-prandial blood glucose goals, pre-exercise blood glucose goals, signs, symptoms, and treatment of hypoglycemia and hyperglycemia, and foot care basics.   Diabetes Blitz:  -Group instruction provided by PowerPoint slides, verbal discussion, and written materials to support subject matter. The instructor gives an explanation and review of the physiology behind type 1 and type 2 diabetes, diabetes medications and rational behind using different medications, pre- and post-prandial blood glucose recommendations and Hemoglobin A1c goals, diabetes diet, and exercise  including blood glucose guidelines for exercising safely.    Portion Distortion:  -Group instruction provided by PowerPoint slides, verbal discussion, written materials, and food models to support subject matter. The instructor gives an explanation of serving size versus portion size, changes in portions sizes over the last 20 years, and what consists of a serving from each food group.   Stress Management:  -Group instruction provided by verbal instruction, video, and written materials to support subject matter.  Instructors review role of stress in heart disease and how to cope with stress positively.     Exercising on Your Own:  -Group instruction provided by verbal instruction, power point, and written materials to support subject.  Instructors discuss  benefits of exercise, components of exercise, frequency and intensity of exercise, and end points for exercise.  Also discuss use of nitroglycerin and activating EMS.  Review options of places to exercise outside of rehab.  Review guidelines for sex with heart disease.   Cardiac Drugs I:  -Group instruction provided by verbal instruction and written materials to support subject.  Instructor reviews cardiac drug classes: antiplatelets, anticoagulants, beta blockers, and statins.  Instructor discusses reasons, side effects, and lifestyle considerations for each drug class.   Cardiac Drugs II:  -Group instruction provided by verbal instruction and written materials to support subject.  Instructor reviews cardiac drug classes: angiotensin converting enzyme inhibitors (ACE-I), angiotensin II receptor blockers (ARBs), nitrates, and calcium channel blockers.  Instructor discusses reasons, side effects, and lifestyle considerations for each drug class.   Anatomy and Physiology of the Circulatory System:  Group verbal and written instruction and models provide basic cardiac anatomy and physiology, with the coronary electrical and arterial systems. Review of: AMI, Angina, Valve disease, Heart Failure, Peripheral Artery Disease, Cardiac Arrhythmia, Pacemakers, and the ICD.   Other Education:  -Group or individual verbal, written, or video instructions that support the educational goals of the cardiac rehab program.   Knowledge Questionnaire Score:     Knowledge Questionnaire Score - 09/19/16 0858      Knowledge Questionnaire Score   Pre Score 21/24      Core Components/Risk Factors/Patient Goals at Admission:     Personal Goals and Risk Factors at Admission - 09/19/16 1033      Core Components/Risk Factors/Patient Goals on Admission   Admit Weight 190 lb 4.1 oz (86.3 kg)      Core Components/Risk Factors/Patient Goals Review:      Goals and Risk Factor Review    Row Name 10/10/16 1522  10/25/16 0745 10/31/16 0744         Core Components/Risk Factors/Patient Goals Review   Personal Goals Review Weight Management/Obesity;Heart Failure;Hypertension;Stress Weight Management/Obesity;Heart Failure;Hypertension;Stress Weight Management/Obesity;Heart Failure;Hypertension;Stress     Review pt demonstrates eagerness to participate in CR offerings. pt diastolic BP elevated at CR at rest and with exercise. Cardiologist made aware.  pt with high stress profession and family situational stress.  pt demonstrates eagerness to participate in CR offerings. pt diastolic BP elevated at CR at rest and with exercise. Cardiologist made aware.  pt with high stress profession and family situational stress.  pt demonstrates eagerness to participate in CR offerings. pt diastolic BP improving at CR at rest and with exercise.  pt with high stress profession and family situational stress.      Expected Outcomes pt will participate in CR exercise, nutrition and lifestyle modification opportunities to decrease overall RF.   pt will participate in CR exercise, nutrition and lifestyle modification opportunities to decrease  overall RF.   pt will participate in CR exercise, nutrition and lifestyle modification opportunities to decrease overall RF.          Core Components/Risk Factors/Patient Goals at Discharge (Final Review):      Goals and Risk Factor Review - 10/31/16 0744      Core Components/Risk Factors/Patient Goals Review   Personal Goals Review Weight Management/Obesity;Heart Failure;Hypertension;Stress   Review pt demonstrates eagerness to participate in CR offerings. pt diastolic BP improving at CR at rest and with exercise.  pt with high stress profession and family situational stress.    Expected Outcomes pt will participate in CR exercise, nutrition and lifestyle modification opportunities to decrease overall RF.        ITP Comments:     ITP Comments    Row Name 09/19/16 0748 10/10/16 1521  10/31/16 0743       ITP Comments Dr. Fransico Him, Medical Director  30 day ITP review.  Pt with good participation and attendance.  No change in current regimen unless directed by Medical Director.   30 day ITP review.  Pt with good participation, work related absences.   No change in current regimen unless directed by Medical Director.          Comments:

## 2016-11-06 ENCOUNTER — Encounter (HOSPITAL_COMMUNITY): Payer: Medicaid Other

## 2016-11-08 ENCOUNTER — Encounter (HOSPITAL_COMMUNITY): Payer: Medicaid Other

## 2016-11-10 ENCOUNTER — Encounter (HOSPITAL_COMMUNITY): Payer: Medicaid Other

## 2016-11-11 ENCOUNTER — Other Ambulatory Visit (HOSPITAL_COMMUNITY): Payer: Self-pay | Admitting: Cardiology

## 2016-11-13 ENCOUNTER — Encounter (HOSPITAL_COMMUNITY): Payer: Medicaid Other

## 2016-11-15 ENCOUNTER — Encounter (HOSPITAL_COMMUNITY): Payer: Medicaid Other

## 2016-11-17 ENCOUNTER — Encounter (HOSPITAL_COMMUNITY): Payer: Medicaid Other

## 2016-11-17 ENCOUNTER — Encounter (HOSPITAL_COMMUNITY): Payer: Self-pay | Admitting: Cardiac Rehabilitation

## 2016-11-17 ENCOUNTER — Telehealth (HOSPITAL_COMMUNITY): Payer: Self-pay | Admitting: Cardiac Rehabilitation

## 2016-11-17 NOTE — Telephone Encounter (Signed)
pc to assess reason for continued absence from [facility].  LMOM, letter mailed to pt home address.

## 2016-11-20 ENCOUNTER — Encounter (HOSPITAL_COMMUNITY): Payer: Medicaid Other

## 2016-11-22 ENCOUNTER — Encounter (HOSPITAL_COMMUNITY)
Admission: RE | Admit: 2016-11-22 | Discharge: 2016-11-22 | Disposition: A | Discharge status: Home / Self Care | Payer: Medicaid Other | Source: Ambulatory Visit | Attending: Cardiology | Admitting: Cardiology

## 2016-11-22 DIAGNOSIS — Z7901 Long term (current) use of anticoagulants: Secondary | ICD-10-CM | POA: Insufficient documentation

## 2016-11-22 DIAGNOSIS — M109 Gout, unspecified: Secondary | ICD-10-CM | POA: Diagnosis not present

## 2016-11-22 DIAGNOSIS — Z79899 Other long term (current) drug therapy: Secondary | ICD-10-CM | POA: Insufficient documentation

## 2016-11-22 DIAGNOSIS — Z9884 Bariatric surgery status: Secondary | ICD-10-CM | POA: Diagnosis not present

## 2016-11-22 DIAGNOSIS — N189 Chronic kidney disease, unspecified: Secondary | ICD-10-CM | POA: Diagnosis not present

## 2016-11-22 DIAGNOSIS — Z87442 Personal history of urinary calculi: Secondary | ICD-10-CM | POA: Insufficient documentation

## 2016-11-22 DIAGNOSIS — I429 Cardiomyopathy, unspecified: Secondary | ICD-10-CM | POA: Diagnosis not present

## 2016-11-22 DIAGNOSIS — M1990 Unspecified osteoarthritis, unspecified site: Secondary | ICD-10-CM | POA: Insufficient documentation

## 2016-11-22 DIAGNOSIS — I872 Venous insufficiency (chronic) (peripheral): Secondary | ICD-10-CM | POA: Insufficient documentation

## 2016-11-22 DIAGNOSIS — I272 Pulmonary hypertension, unspecified: Secondary | ICD-10-CM | POA: Diagnosis not present

## 2016-11-22 DIAGNOSIS — Z8701 Personal history of pneumonia (recurrent): Secondary | ICD-10-CM | POA: Insufficient documentation

## 2016-11-22 DIAGNOSIS — Z952 Presence of prosthetic heart valve: Secondary | ICD-10-CM

## 2016-11-22 DIAGNOSIS — J45909 Unspecified asthma, uncomplicated: Secondary | ICD-10-CM | POA: Insufficient documentation

## 2016-11-22 DIAGNOSIS — I5022 Chronic systolic (congestive) heart failure: Secondary | ICD-10-CM | POA: Diagnosis not present

## 2016-11-24 ENCOUNTER — Encounter (HOSPITAL_COMMUNITY): Payer: Medicaid Other

## 2016-11-27 ENCOUNTER — Encounter (HOSPITAL_COMMUNITY)
Admission: RE | Admit: 2016-11-27 | Discharge: 2016-11-27 | Disposition: A | Discharge status: Home / Self Care | Payer: Medicaid Other | Source: Ambulatory Visit | Attending: Cardiology | Admitting: Cardiology

## 2016-11-27 ENCOUNTER — Encounter (HOSPITAL_COMMUNITY): Payer: Medicaid Other | Admitting: Cardiology

## 2016-11-27 DIAGNOSIS — Z952 Presence of prosthetic heart valve: Secondary | ICD-10-CM | POA: Diagnosis not present

## 2016-11-29 ENCOUNTER — Encounter (HOSPITAL_COMMUNITY): Payer: Medicaid Other

## 2016-12-01 ENCOUNTER — Encounter (HOSPITAL_COMMUNITY)
Admission: RE | Admit: 2016-12-01 | Discharge: 2016-12-01 | Disposition: A | Discharge status: Home / Self Care | Payer: Medicaid Other | Source: Ambulatory Visit | Attending: Cardiology | Admitting: Cardiology

## 2016-12-01 DIAGNOSIS — Z952 Presence of prosthetic heart valve: Secondary | ICD-10-CM

## 2016-12-01 NOTE — Progress Notes (Signed)
Cardiac Individual Treatment Plan  Patient Details  Name: Vernon Blackburn MRN: [id number] Date of Birth: 1965/02/15 Referring Provider:     CARDIAC REHAB PHASE II ORIENTATION from 09/19/2016 in [facility]  Referring Provider  Fransico Him MD and Loralie Champagne MD      Initial Encounter Date:    CARDIAC REHAB PHASE II ORIENTATION from 09/19/2016 in [facility]  Date  09/19/16  Referring Provider  Fransico Him MD and Loralie Champagne MD      Visit Diagnosis: S/P mitral valve replacement  Patient's Home Medications on Admission:  Current Outpatient Medications:  .  acetaminophen (TYLENOL) 500 MG tablet, Take 1,000 mg by mouth every 6 (six) hours as needed for moderate pain., Disp: , Rfl:  .  amLODipine (NORVASC) 10 MG tablet, Take 1 tablet (10 mg total) by mouth daily., Disp: 30 tablet, Rfl: 3 .  aspirin EC 81 MG tablet, Take 1 tablet (81 mg total) by mouth daily., Disp: 90 tablet, Rfl: 3 .  carvedilol (COREG) 12.5 MG tablet, Take 2 tablets (25 mg total) by mouth 2 (two) times daily., Disp: 60 tablet, Rfl: 1 .  cholecalciferol (VITAMIN D) 1000 units tablet, Take 1,000 Units by mouth daily., Disp: , Rfl:  .  FeAspGl-FeFum-B12-FA-C-Succ Ac (FERREX 28 PO), Take 1 tablet by mouth daily. , Disp: , Rfl:  .  Febuxostat (ULORIC) 80 MG TABS, Take 80 mg by mouth daily. , Disp: , Rfl:  .  furosemide (LASIX) 40 MG tablet, Take 40 mg (1 Tablet) in the AM and 20 mg (0.5 Tablet) in the PM., Disp: 60 tablet, Rfl: 3 .  hydrALAZINE (APRESOLINE) 100 MG tablet, Take 1 tablet (100 mg total) by mouth 3 (three) times daily., Disp: 90 tablet, Rfl: 3 .  isosorbide mononitrate (IMDUR) 60 MG 24 hr tablet, Take 1 tablet (60 mg total) by mouth daily., Disp: 30 tablet, Rfl: 3 .  Multiple Vitamin (MULTIVITAMIN WITH MINERALS) TABS tablet, Take 1 tablet by mouth 2 (two) times daily. , Disp: , Rfl:  .  potassium chloride (K-DUR) 10 MEQ tablet, TAKE 1 TABLET BY  MOUTH ONCE DAILY, Disp: 30 tablet, Rfl: 3 .  potassium chloride (K-DUR,KLOR-CON) 20 MEQ tablet, Take 1 tablet (20 mEq total) by mouth daily., Disp: 30 tablet, Rfl: 3  Past Medical History: Past Medical History:  Diagnosis Date  . Anemia, chronic disease   . Arthritis    Gout  . Asthma   . Cardiomyopathy ([place])   . CHF (congestive heart failure) ([place])    a. 01/2016: echo showing EF of 20-25%, no WMA, severe MR, and PA Peak Pressure of 52 mm Hg.   Marland Kitchen Chronic systolic HF (heart failure) ([place])   . CKD (chronic kidney disease)    Dr [name]  . Elbow pain, left   . Heart murmur   . History of kidney stones   . Knee pain, bilateral   . Left ankle pain 11/04/2010   Gout  . Leg pain, bilateral   . Peripheral edema    Bilateral lower extremity   . Pneumonia    hx  . Pulmonary HTN ([place])   . S/P minimally invasive mitral valve repair 06/29/2016   30 mm [name] via right mini thoracotomy approach  . Severe mitral regurgitation   . Sleep apnea    hx bariatric  surgery 3 yrs ago lost 166 lbs  . Syncope and collapse 07/14/2016  . Venous insufficiency  Tobacco Use: Social History   Tobacco Use  Smoking Status Never Smoker  Smokeless Tobacco Never Used    Labs: Recent Review Flowsheet Data    Labs for ITP Cardiac and Pulmonary Rehab Latest Ref Rng & Units 06/29/2016 06/29/2016 06/29/2016 06/29/2016 06/30/2016   Hemoglobin A1c 4.8 - 5.6 % - - - - -   PHART 7.350 - 7.450 7.472(H) - 7.375 7.392 -   PCO2ART 32.0 - 48.0 mmHg 41.1 - 44.7 45.7 -   HCO3 20.0 - 28.0 mmol/L 30.4(H) - 26.2 27.9 -   TCO2 0 - 100 mmol/L 32 27 28 29 29    ACIDBASEDEF 0.0 - 2.0 mmol/L - - - - -   O2SAT % 98.0 - 99.0 99.0 -      Capillary Blood Glucose: Lab Results  Component Value Date   GLUCAP 90 07/02/2016   GLUCAP 96 07/02/2016   GLUCAP 90 07/02/2016   GLUCAP 90 07/02/2016   GLUCAP 84 07/01/2016     Exercise Target Goals:    Exercise Program Goal: Individual exercise  prescription set with THRR, safety & activity barriers. Participant demonstrates ability to understand and report RPE using BORG scale, to self-measure pulse accurately, and to acknowledge the importance of the exercise prescription.  Exercise Prescription Goal: Starting with aerobic activity 30 plus minutes a day, 3 days per week for initial exercise prescription. Provide home exercise prescription and guidelines that participant acknowledges understanding prior to discharge.  Activity Barriers & Risk Stratification: Activity Barriers & Cardiac Risk Stratification - 09/19/16 0921      Activity Barriers & Cardiac Risk Stratification   Activity Barriers  Other (comment);Muscular Weakness;Deconditioning    Comments  gout L toe    Cardiac Risk Stratification  High       6 Minute Walk: 6 Minute Walk    Row Name 09/19/16 0907         6 Minute Walk   Phase  Initial     Distance  1738 feet     Walk Time  6 minutes     # of Rest Breaks  0     MPH  3.3     METS  4.7     RPE  9     VO2 Peak  16.6     Symptoms  No     Resting HR  71 bpm     Resting BP  131/91     Resting Oxygen Saturation   100 %     Exercise Oxygen Saturation  during 6 min walk  100 %     Max Ex. HR  94 bpm     Max Ex. BP  139/96     2 Minute Post BP  114/82        Oxygen Initial Assessment:   Oxygen Re-Evaluation:   Oxygen Discharge (Final Oxygen Re-Evaluation):   Initial Exercise Prescription: Initial Exercise Prescription - 09/19/16 0900      Date of Initial Exercise RX and Referring Provider   Date  09/19/16    Referring Provider  Fransico Him MD and Loralie Champagne MD      Treadmill   MPH  3    Grade  1    Minutes  10    METs  3.71      Bike   Level  1    Minutes  10    METs  3.22      NuStep   Level  3    SPM  80  Minutes  10    METs  2.5      Prescription Details   Frequency (times per week)  3    Duration  Progress to 30 minutes of continuous aerobic without signs/symptoms  of physical distress      Intensity   THRR 40-80% of Max Heartrate  68-136    Ratings of Perceived Exertion  11-13    Perceived Dyspnea  0-4      Progression   Progression  Continue to progress workloads to maintain intensity without signs/symptoms of physical distress.      Resistance Training   Training Prescription  Yes    Weight  3lbs    Reps  10-15       Perform Capillary Blood Glucose checks as needed.  Exercise Prescription Changes: Exercise Prescription Changes    Row Name 10/04/16 1400 10/25/16 0654 11/22/16 0653 11/29/16 0700       Response to Exercise   Blood Pressure (Admit)  128/84  130/82  124/86  130/84    Blood Pressure (Exercise)  120/84  128/88  150/88  120/80    Blood Pressure (Exit)  100/70  138/90  108/88  130/80    Heart Rate (Admit)  78 bpm  94 bpm  88 bpm  84 bpm    Heart Rate (Exercise)  106 bpm  112 bpm  107 bpm  120 bpm    Heart Rate (Exit)  77 bpm  92 bpm  81 bpm  77 bpm    Rating of Perceived Exertion (Exercise)  11  12  11  13     Symptoms  none  none  none  none    Duration  Continue with 30 min of aerobic exercise without signs/symptoms of physical distress.  Continue with 30 min of aerobic exercise without signs/symptoms of physical distress.  Continue with 30 min of aerobic exercise without signs/symptoms of physical distress.  Continue with 30 min of aerobic exercise without signs/symptoms of physical distress.    Intensity  THRR unchanged  THRR unchanged  THRR unchanged  THRR unchanged      Progression   Progression  Continue to progress workloads to maintain intensity without signs/symptoms of physical distress.  Continue to progress workloads to maintain intensity without signs/symptoms of physical distress.  Continue to progress workloads to maintain intensity without signs/symptoms of physical distress.  Continue to progress workloads to maintain intensity without signs/symptoms of physical distress.    Average METs  3.2  3  3.2  3.5       Resistance Training   Training Prescription  Yes  Yes  No  Yes    Weight  3lbs  3lbs  -  3lbs    Reps  10-15  10-15  -  10-15    Time  10 Minutes  10 Minutes  -  10 Minutes      Interval Training   Interval Training  No  No  No  No      Treadmill   MPH  3  3  3  3     Grade  1  1  1  1     Minutes  10  10  10  10     METs  3.71  3.71  3.71  3.71      Bike   Level  1  1  1   1.5    Minutes  10  10  10  10     METs  3.15  3.12  3.12  4.1      NuStep   Level  3  3  5  5     SPM  80  80  80  80    Minutes  10  10  10  10     METs  2.8  2.2  2.8  2.7      Home Exercise Plan   Plans to continue exercise at  Home (comment)  Home (comment)  Home (comment)  Home (comment)    Frequency  Add 3 additional days to program exercise sessions.  Add 3 additional days to program exercise sessions.  Add 3 additional days to program exercise sessions.  Add 3 additional days to program exercise sessions.    Initial Home Exercises Provided  10/04/16  10/04/16  10/04/16  10/04/16       Exercise Comments: Exercise Comments    Row Name 10/04/16 0800 11/03/16 1156 11/22/16 0800 12/01/16 0705     Exercise Comments  Reviewed home exercise prescription, METs and goals with patient.  Patient has been out the past week.  Reviewed METs and goals with patient.  Reviewed METs with patient.       Exercise Goals and Review: Exercise Goals    Row Name 09/19/16 7322             Exercise Goals   Increase Physical Activity  Yes       Intervention  Provide advice, education, support and counseling about physical activity/exercise needs.;Develop an individualized exercise prescription for aerobic and resistive training based on initial evaluation findings, risk stratification, comorbidities and participant's personal goals.       Expected Outcomes  Achievement of increased cardiorespiratory fitness and enhanced flexibility, muscular endurance and strength shown through measurements of functional capacity and  personal statement of participant.       Increase Strength and Stamina  Yes increase muscle mass       Intervention  Provide advice, education, support and counseling about physical activity/exercise needs.;Develop an individualized exercise prescription for aerobic and resistive training based on initial evaluation findings, risk stratification, comorbidities and participant's personal goals.       Expected Outcomes  Achievement of increased cardiorespiratory fitness and enhanced flexibility, muscular endurance and strength shown through measurements of functional capacity and personal statement of participant.       Able to understand and use rate of perceived exertion (RPE) scale  Yes       Intervention  Provide education and explanation on how to use RPE scale       Expected Outcomes  Short Term: Able to use RPE daily in rehab to express subjective intensity level;Long Term:  Able to use RPE to guide intensity level when exercising independently       Knowledge and understanding of Target Heart Rate Range (THRR)  Yes       Intervention  Provide education and explanation of THRR including how the numbers were predicted and where they are located for reference       Expected Outcomes  Short Term: Able to state/look up THRR;Long Term: Able to use THRR to govern intensity when exercising independently;Short Term: Able to use daily as guideline for intensity in rehab       Able to check pulse independently  Yes       Intervention  Provide education and demonstration on how to check pulse in carotid and radial arteries.;Review the importance of being able to check your own pulse for safety during independent exercise  Expected Outcomes  Short Term: Able to explain why pulse checking is important during independent exercise;Long Term: Able to check pulse independently and accurately       Understanding of Exercise Prescription  Yes       Intervention  Provide education, explanation, and written  materials on patient's individual exercise prescription       Expected Outcomes  Short Term: Able to explain program exercise prescription;Long Term: Able to explain home exercise prescription to exercise independently          Exercise Goals Re-Evaluation : Exercise Goals Re-Evaluation    Row Name 10/04/16 1358 11/03/16 1157 11/22/16 0800         Exercise Goal Re-Evaluation   Exercise Goals Review  Understanding of Exercise Prescription;Increase Physical Activity;Knowledge and understanding of Target Heart Rate Range (THRR);Able to understand and use rate of perceived exertion (RPE) scale  -  Increase Physical Activity     Comments  Reviewed home exercies prescription with patient including THRR, RPE scale and end points for exercise. Pt is walking 30 minutes, 3 days/week in addition to exercise at cardiac rehab.  Patient has been out the past week, unable to reevalute goals.  Patient returned to exercise at cardiac rehab today and tolerated well. Increased workload on the NuStep. Pt is not consistently exercising when not at CR. Encouraged patient to walk 30 minutes at least 2 days/week, and pt is amenable to this.     Expected Outcomes  Continue exercise at cardiac rehab and home 30 minutes 6 days/week.  Continue exercise at cardiac rehab and home 30 minutes 6 days/week.  Patient will walk at least 30 minutes 2 days/week at home and attend CR consistently to help achieve health and fitness goals.         Discharge Exercise Prescription (Final Exercise Prescription Changes): Exercise Prescription Changes - 11/29/16 0700      Response to Exercise   Blood Pressure (Admit)  130/84    Blood Pressure (Exercise)  120/80    Blood Pressure (Exit)  130/80    Heart Rate (Admit)  84 bpm    Heart Rate (Exercise)  120 bpm    Heart Rate (Exit)  77 bpm    Rating of Perceived Exertion (Exercise)  13    Symptoms  none    Duration  Continue with 30 min of aerobic exercise without signs/symptoms of  physical distress.    Intensity  THRR unchanged      Progression   Progression  Continue to progress workloads to maintain intensity without signs/symptoms of physical distress.    Average METs  3.5      Resistance Training   Training Prescription  Yes    Weight  3lbs    Reps  10-15    Time  10 Minutes      Interval Training   Interval Training  No      Treadmill   MPH  3    Grade  1    Minutes  10    METs  3.71      Bike   Level  1.5    Minutes  10    METs  4.1      NuStep   Level  5    SPM  80    Minutes  10    METs  2.7      Home Exercise Plan   Plans to continue exercise at  Home (comment)    Frequency  Add 3 additional  days to program exercise sessions.    Initial Home Exercises Provided  10/04/16       Nutrition:  Target Goals: Understanding of nutrition guidelines, daily intake of sodium 1500mg , cholesterol 200mg , calories 30% from fat and 7% or less from saturated fats, daily to have 5 or more servings of fruits and vegetables.  Biometrics:  Post Biometrics - 09/19/16 0912       Post  Biometrics   Height  5\' 9"  (1.753 m)    Weight  190 lb 4.1 oz (86.3 kg)    Waist Circumference  37.5 inches    Hip Circumference  39.75 inches    Waist to Hip Ratio  0.94 %    BMI (Calculated)  28.08    Triceps Skinfold  12 mm    % Body Fat  24.9 %    Grip Strength  46 kg    Flexibility  13 in    Single Leg Stand  15 seconds       Nutrition Therapy Plan and Nutrition Goals:   Nutrition Discharge: Nutrition Scores:   Nutrition Goals Re-Evaluation:   Nutrition Goals Re-Evaluation:   Nutrition Goals Discharge (Final Nutrition Goals Re-Evaluation):   Psychosocial: Target Goals: Acknowledge presence or absence of significant depression and/or stress, maximize coping skills, provide positive support system. Participant is able to verbalize types and ability to use techniques and skills needed for reducing stress and depression.  Initial Review &  Psychosocial Screening: Initial Psych Review & Screening - 09/19/16 0900      Initial Review   Current issues with  Current Stress Concerns    Source of Stress Concerns  Family divorce       Family Dynamics   Good Support System?  Yes friend       Barriers   Psychosocial barriers to participate in program  The patient should benefit from training in stress management and relaxation.      Screening Interventions   Interventions  Encouraged to exercise;Provide feedback about the scores to participant       Quality of Life Scores: Quality of Life - 09/29/16 1057      Quality of Life Scores   Health/Function Pre  24.4 %    Socioeconomic Pre  26.1 %    Psych/Spiritual Pre  28.2 %    Family Pre  20.4 % adult children live out of town, recent divorce    GLOBAL Pre  25 % QOL scores reviewed with pt.  Despite current life stressors, pt exhibits positive outlook with good coping skills. pt active at work, current fellowship with fraternity and maintains contact with adult children.  pt considering Divorce Care support grou       PHQ-9: Recent Review Flowsheet Data    Depression screen [facility] 2/9 09/27/2016   Decreased Interest 0   Down, Depressed, Hopeless 0   PHQ - 2 Score 0     Interpretation of Total Score  Total Score Depression Severity:  1-4 = Minimal depression, 5-9 = Mild depression, 10-14 = Moderate depression, 15-19 = Moderately severe depression, 20-27 = Severe depression   Psychosocial Evaluation and Intervention: Psychosocial Evaluation - 09/27/16 1108      Psychosocial Evaluation & Interventions   Interventions  Stress management education;Relaxation education;Encouraged to exercise with the program and follow exercise prescription    Comments  pt with identified life transition stressors.  pt appears to manage stress appropriately.  will continue to monitor.    Expected Outcomes  pt will exhibit  positive outlook with good coping skills.     Continue Psychosocial  Services   No Follow up required       Psychosocial Re-Evaluation: Psychosocial Re-Evaluation    [place] Name 10/10/16 1524 10/31/16 0745 11/28/16 1547         Psychosocial Re-Evaluation   Current issues with  Current Stress Concerns  Current Stress Concerns  Current Stress Concerns     Comments  pt with life transition stressors and high stress profession.  overall pt demonstrates positive outlook, good coping skills and support people.   pt with life transition stressors and high stress profession.  overall pt demonstrates positive outlook, good coping skills and support people.   pt with life transition stressors and high stress profession.  overall pt demonstrates positive outlook, good coping skills and support people.      Expected Outcomes  pt will exhibit improved stress management techniques, positive outlook with good coping skill.s   pt will exhibit improved stress management techniques, positive outlook with good coping skill.s   pt will exhibit improved stress management techniques, positive outlook with good coping skill.s      Interventions  Stress management education;Relaxation education;Encouraged to attend Cardiac Rehabilitation for the exercise  Stress management education;Relaxation education;Encouraged to attend Cardiac Rehabilitation for the exercise  Stress management education;Relaxation education;Encouraged to attend Cardiac Rehabilitation for the exercise     Continue Psychosocial Services   Follow up required by staff  Follow up required by staff  Follow up required by staff       Initial Review   Source of Stress Concerns  Family;Occupation  Family;Occupation  -        Psychosocial Discharge (Final Psychosocial Re-Evaluation): Psychosocial Re-Evaluation - 11/28/16 1547      Psychosocial Re-Evaluation   Current issues with  Current Stress Concerns    Comments  pt with life transition stressors and high stress profession.  overall pt demonstrates positive outlook,  good coping skills and support people.     Expected Outcomes  pt will exhibit improved stress management techniques, positive outlook with good coping skill.s     Interventions  Stress management education;Relaxation education;Encouraged to attend Cardiac Rehabilitation for the exercise    Continue Psychosocial Services   Follow up required by staff       Vocational Rehabilitation: Provide vocational rehab assistance to qualifying candidates.   Vocational Rehab Evaluation & Intervention: Vocational Rehab - 09/19/16 0859      Initial Vocational Rehab Evaluation & Intervention   Assessment shows need for Vocational Rehabilitation  No       Education: Education Goals: Education classes will be provided on a weekly basis, covering required topics. Participant will state understanding/return demonstration of topics presented.  Learning Barriers/Preferences: Learning Barriers/Preferences - 09/19/16 0920      Learning Barriers/Preferences   Learning Barriers  Sight    Learning Preferences  Written Material;Video;Skilled Demonstration       Education Topics: Count Your Pulse:  -Group instruction provided by verbal instruction, demonstration, patient participation and written materials to support subject.  Instructors address importance of being able to find your pulse and how to count your pulse when at home without a heart monitor.  Patients get hands on experience counting their pulse with staff help and individually.   Heart Attack, Angina, and Risk Factor Modification:  -Group instruction provided by verbal instruction, video, and written materials to support subject.  Instructors address signs and symptoms of angina and heart attacks.    Also  discuss risk factors for heart disease and how to make changes to improve heart health risk factors.   Functional Fitness:  -Group instruction provided by verbal instruction, demonstration, patient participation, and written materials to  support subject.  Instructors address safety measures for doing things around the house.  Discuss how to get up and down off the floor, how to pick things up properly, how to safely get out of a chair without assistance, and balance training.   Meditation and Mindfulness:  -Group instruction provided by verbal instruction, patient participation, and written materials to support subject.  Instructor addresses importance of mindfulness and meditation practice to help reduce stress and improve awareness.  Instructor also leads participants through a meditation exercise.    Stretching for Flexibility and Mobility:  -Group instruction provided by verbal instruction, patient participation, and written materials to support subject.  Instructors lead participants through series of stretches that are designed to increase flexibility thus improving mobility.  These stretches are additional exercise for major muscle groups that are typically performed during regular warm up and cool down.   Hands Only CPR:  -Group verbal, video, and participation provides a basic overview of AHA guidelines for community CPR. Role-play of emergencies allow participants the opportunity to practice calling for help and chest compression technique with discussion of AED use.   Hypertension: -Group verbal and written instruction that provides a basic overview of hypertension including the most recent diagnostic guidelines, risk factor reduction with self-care instructions and medication management.    Nutrition I class: Heart Healthy Eating:  -Group instruction provided by PowerPoint slides, verbal discussion, and written materials to support subject matter. The instructor gives an explanation and review of the Therapeutic Lifestyle Changes diet recommendations, which includes a discussion on lipid goals, dietary fat, sodium, fiber, plant stanol/sterol esters, sugar, and the components of a well-balanced, healthy  diet.   Nutrition II class: Lifestyle Skills:  -Group instruction provided by PowerPoint slides, verbal discussion, and written materials to support subject matter. The instructor gives an explanation and review of label reading, grocery shopping for heart health, heart healthy recipe modifications, and ways to make healthier choices when eating out.   Diabetes Question & Answer:  -Group instruction provided by PowerPoint slides, verbal discussion, and written materials to support subject matter. The instructor gives an explanation and review of diabetes co-morbidities, pre- and post-prandial blood glucose goals, pre-exercise blood glucose goals, signs, symptoms, and treatment of hypoglycemia and hyperglycemia, and foot care basics.   Diabetes Blitz:  -Group instruction provided by PowerPoint slides, verbal discussion, and written materials to support subject matter. The instructor gives an explanation and review of the physiology behind type 1 and type 2 diabetes, diabetes medications and rational behind using different medications, pre- and post-prandial blood glucose recommendations and Hemoglobin A1c goals, diabetes diet, and exercise including blood glucose guidelines for exercising safely.    Portion Distortion:  -Group instruction provided by PowerPoint slides, verbal discussion, written materials, and food models to support subject matter. The instructor gives an explanation of serving size versus portion size, changes in portions sizes over the last 20 years, and what consists of a serving from each food group.   Stress Management:  -Group instruction provided by verbal instruction, video, and written materials to support subject matter.  Instructors review role of stress in heart disease and how to cope with stress positively.     Exercising on Your Own:  -Group instruction provided by verbal instruction, power point, and written materials to  support subject.  Instructors discuss  benefits of exercise, components of exercise, frequency and intensity of exercise, and end points for exercise.  Also discuss use of nitroglycerin and activating EMS.  Review options of places to exercise outside of rehab.  Review guidelines for sex with heart disease.   Cardiac Drugs I:  -Group instruction provided by verbal instruction and written materials to support subject.  Instructor reviews cardiac drug classes: antiplatelets, anticoagulants, beta blockers, and statins.  Instructor discusses reasons, side effects, and lifestyle considerations for each drug class.   Cardiac Drugs II:  -Group instruction provided by verbal instruction and written materials to support subject.  Instructor reviews cardiac drug classes: angiotensin converting enzyme inhibitors (ACE-I), angiotensin II receptor blockers (ARBs), nitrates, and calcium channel blockers.  Instructor discusses reasons, side effects, and lifestyle considerations for each drug class.   Anatomy and Physiology of the Circulatory System:  Group verbal and written instruction and models provide basic cardiac anatomy and physiology, with the coronary electrical and arterial systems. Review of: AMI, Angina, Valve disease, Heart Failure, Peripheral Artery Disease, Cardiac Arrhythmia, Pacemakers, and the ICD.   Other Education:  -Group or individual verbal, written, or video instructions that support the educational goals of the cardiac rehab program.   Knowledge Questionnaire Score: Knowledge Questionnaire Score - 09/19/16 0858      Knowledge Questionnaire Score   Pre Score  21/24       Core Components/Risk Factors/Patient Goals at Admission: Personal Goals and Risk Factors at Admission - 09/19/16 1033      Core Components/Risk Factors/Patient Goals on Admission   Admit Weight  190 lb 4.1 oz (86.3 kg)       Core Components/Risk Factors/Patient Goals Review:  Goals and Risk Factor Review    Row Name 10/10/16 1522 10/25/16 0745  10/31/16 0744 11/28/16 1547       Core Components/Risk Factors/Patient Goals Review   Personal Goals Review  Weight Management/Obesity;Heart Failure;Hypertension;Stress  Weight Management/Obesity;Heart Failure;Hypertension;Stress  Weight Management/Obesity;Heart Failure;Hypertension;Stress  Weight Management/Obesity;Heart Failure;Hypertension;Stress    Review  pt demonstrates eagerness to participate in CR offerings. pt diastolic BP elevated at CR at rest and with exercise. Cardiologist made aware.  pt with high stress profession and family situational stress.   pt demonstrates eagerness to participate in CR offerings. pt diastolic BP elevated at CR at rest and with exercise. Cardiologist made aware.  pt with high stress profession and family situational stress.   pt demonstrates eagerness to participate in CR offerings. pt diastolic BP improving at CR at rest and with exercise.  pt with high stress profession and family situational stress.   pt demonstrates eagerness to participate in CR offerings. pt diastolic BP continues to exhibit improvement at CR at rest and with exercise.  pt with high stress profession and family situational stress.     Expected Outcomes  pt will participate in CR exercise, nutrition and lifestyle modification opportunities to decrease overall RF.    pt will participate in CR exercise, nutrition and lifestyle modification opportunities to decrease overall RF.    pt will participate in CR exercise, nutrition and lifestyle modification opportunities to decrease overall RF.    pt will participate in CR exercise, nutrition and lifestyle modification opportunities to decrease overall RF.         Core Components/Risk Factors/Patient Goals at Discharge (Final Review):  Goals and Risk Factor Review - 11/28/16 1547      Core Components/Risk Factors/Patient Goals Review   Personal Goals Review  Weight  Management/Obesity;Heart Failure;Hypertension;Stress    Review  pt demonstrates  eagerness to participate in CR offerings. pt diastolic BP continues to exhibit improvement at CR at rest and with exercise.  pt with high stress profession and family situational stress.     Expected Outcomes  pt will participate in CR exercise, nutrition and lifestyle modification opportunities to decrease overall RF.         ITP Comments: ITP Comments    Row Name 09/19/16 0748 10/10/16 1521 10/31/16 0743 11/28/16 1546     ITP Comments  Dr. Fransico Him, Medical Director   30 day ITP review.  Pt with good participation and attendance.  No change in current regimen unless directed by Medical Director.    30 day ITP review.  Pt with good participation, work related absences.   No change in current regimen unless directed by Medical Director.    30 day ITP review.  Pt with frequent work related absences.  pt plans to adjust work hours to increase ability to attend CR.   No change in current regimen unless directed by Medical Director.         Comments:

## 2016-12-04 ENCOUNTER — Encounter (HOSPITAL_COMMUNITY): Payer: Medicaid Other

## 2016-12-04 ENCOUNTER — Encounter: Payer: Self-pay | Admitting: Physician Assistant

## 2016-12-06 ENCOUNTER — Encounter (HOSPITAL_COMMUNITY)
Admission: RE | Admit: 2016-12-06 | Discharge: 2016-12-06 | Disposition: A | Discharge status: Home / Self Care | Payer: Medicaid Other | Source: Ambulatory Visit | Attending: Cardiology | Admitting: Cardiology

## 2016-12-06 VITALS — Ht 69.02 in | Wt 205.5 lb

## 2016-12-06 DIAGNOSIS — I5022 Chronic systolic (congestive) heart failure: Secondary | ICD-10-CM | POA: Diagnosis not present

## 2016-12-06 DIAGNOSIS — I429 Cardiomyopathy, unspecified: Secondary | ICD-10-CM | POA: Insufficient documentation

## 2016-12-06 DIAGNOSIS — M1990 Unspecified osteoarthritis, unspecified site: Secondary | ICD-10-CM | POA: Diagnosis not present

## 2016-12-06 DIAGNOSIS — Z952 Presence of prosthetic heart valve: Secondary | ICD-10-CM | POA: Diagnosis present

## 2016-12-06 DIAGNOSIS — I872 Venous insufficiency (chronic) (peripheral): Secondary | ICD-10-CM | POA: Diagnosis not present

## 2016-12-06 DIAGNOSIS — Z7901 Long term (current) use of anticoagulants: Secondary | ICD-10-CM | POA: Diagnosis not present

## 2016-12-06 DIAGNOSIS — J45909 Unspecified asthma, uncomplicated: Secondary | ICD-10-CM | POA: Diagnosis not present

## 2016-12-06 DIAGNOSIS — Z9884 Bariatric surgery status: Secondary | ICD-10-CM | POA: Diagnosis not present

## 2016-12-06 DIAGNOSIS — Z8701 Personal history of pneumonia (recurrent): Secondary | ICD-10-CM | POA: Diagnosis not present

## 2016-12-06 DIAGNOSIS — Z79899 Other long term (current) drug therapy: Secondary | ICD-10-CM | POA: Insufficient documentation

## 2016-12-06 DIAGNOSIS — Z87442 Personal history of urinary calculi: Secondary | ICD-10-CM | POA: Insufficient documentation

## 2016-12-06 DIAGNOSIS — N189 Chronic kidney disease, unspecified: Secondary | ICD-10-CM | POA: Diagnosis not present

## 2016-12-06 DIAGNOSIS — I272 Pulmonary hypertension, unspecified: Secondary | ICD-10-CM | POA: Insufficient documentation

## 2016-12-06 DIAGNOSIS — M109 Gout, unspecified: Secondary | ICD-10-CM | POA: Diagnosis not present

## 2016-12-06 NOTE — Progress Notes (Signed)
Daily Session Note  Patient Details  Name: Vernon Blackburn MRN: [id number] Date of Birth: 09/16/1965 Referring Provider:     CARDIAC REHAB PHASE II ORIENTATION from 09/19/2016 in [facility]  Referring Provider  [name], [name] MD and [name], [name] MD      Encounter Date: 12/06/2016  Check In: Session Check In - 12/06/16 0742      Check-In   Location  [facility]    Staff Present  Olinty Richards, MS, ACSM CEP, Exercise Physiologist;Tyara Nevels, MS,ACSM CEP, Exercise Physiologist;Joann Rion, RN, BSN    Supervising physician immediately available to respond to emergencies  [facility] immediately available    Physician(s)  Dr. Alekh    Medication changes reported      No    Fall or balance concerns reported     No    Tobacco Cessation  No Change    Warm-up and Cool-down  Performed as group-led instruction    Resistance Training Performed  No    VAD Patient?  No      Pain Assessment   Currently in Pain?  No/denies    Multiple Pain Sites  No       Capillary Blood Glucose: No results found for this or any previous visit (from the past 24 hour(s)).    Social History   Tobacco Use  Smoking Status Never Smoker  Smokeless Tobacco Never Used    Goals Met:  Independence with exercise equipment  Goals Unmet:  BP  Comments: pt hypertensive with exercise.  BP:  164/94 post walk test, 144/100 on airdyne. Pt denies missed medication doses however admits he has not been sleeping due to working late hours on upcoming court case.  Pt has previously scheduled appointment [name] 12/08/2016.  Inbasket and rehab report sent for [name] to review.  Joann Rion, RN, BSN Cardiac Pulmonary Rehab 12/06/16  11:10 AM    Dr. [name] [name] is Medical Director for Cardiac Rehab at [facility]. 

## 2016-12-08 ENCOUNTER — Encounter (HOSPITAL_COMMUNITY)
Admission: RE | Admit: 2016-12-08 | Discharge: 2016-12-08 | Disposition: A | Discharge status: Home / Self Care | Payer: Medicaid Other | Source: Ambulatory Visit | Attending: Cardiology | Admitting: Cardiology

## 2016-12-08 ENCOUNTER — Encounter (HOSPITAL_COMMUNITY): Payer: Self-pay | Admitting: Cardiology

## 2016-12-08 ENCOUNTER — Ambulatory Visit (HOSPITAL_COMMUNITY)
Admission: RE | Admit: 2016-12-08 | Discharge: 2016-12-08 | Disposition: A | Discharge status: Home / Self Care | Payer: Medicaid Other | Source: Ambulatory Visit | Attending: Cardiology | Admitting: Cardiology

## 2016-12-08 ENCOUNTER — Telehealth (HOSPITAL_COMMUNITY): Payer: Self-pay | Admitting: Cardiology

## 2016-12-08 VITALS — BP 160/106 | HR 88 | Wt 206.1 lb

## 2016-12-08 DIAGNOSIS — Z952 Presence of prosthetic heart valve: Secondary | ICD-10-CM | POA: Diagnosis not present

## 2016-12-08 DIAGNOSIS — Z7982 Long term (current) use of aspirin: Secondary | ICD-10-CM | POA: Insufficient documentation

## 2016-12-08 DIAGNOSIS — I428 Other cardiomyopathies: Secondary | ICD-10-CM | POA: Diagnosis not present

## 2016-12-08 DIAGNOSIS — Z79899 Other long term (current) drug therapy: Secondary | ICD-10-CM | POA: Diagnosis not present

## 2016-12-08 DIAGNOSIS — N184 Chronic kidney disease, stage 4 (severe): Secondary | ICD-10-CM | POA: Insufficient documentation

## 2016-12-08 DIAGNOSIS — I130 Hypertensive heart and chronic kidney disease with heart failure and stage 1 through stage 4 chronic kidney disease, or unspecified chronic kidney disease: Secondary | ICD-10-CM | POA: Diagnosis not present

## 2016-12-08 DIAGNOSIS — I5022 Chronic systolic (congestive) heart failure: Secondary | ICD-10-CM

## 2016-12-08 DIAGNOSIS — I340 Nonrheumatic mitral (valve) insufficiency: Secondary | ICD-10-CM | POA: Insufficient documentation

## 2016-12-08 LAB — BASIC METABOLIC PANEL
Anion gap: 8 (ref 5–15)
BUN: 28 mg/dL — ABNORMAL HIGH (ref 6–20)
CO2: 28 mmol/L (ref 22–32)
Calcium: 8.8 mg/dL — ABNORMAL LOW (ref 8.9–10.3)
Chloride: 104 mmol/L (ref 101–111)
Creatinine, Ser: 2.77 mg/dL — ABNORMAL HIGH (ref 0.61–1.24)
GFR calc Af Amer: 29 mL/min — ABNORMAL LOW (ref >60)
GFR calc non Af Amer: 25 mL/min — ABNORMAL LOW (ref >60)
Glucose, Bld: 90 mg/dL (ref 65–99)
Potassium: 3.3 mmol/L — ABNORMAL LOW (ref 3.5–5.1)
Sodium: 140 mmol/L (ref 135–145)

## 2016-12-08 MED ORDER — SPIRONOLACTONE 25 MG PO TABS: 25.0000 mg | ORAL_TABLET | Freq: Every day | ORAL | 3 refills | Status: DC

## 2016-12-08 NOTE — Patient Instructions (Addendum)
Stop Potassium  Start Spironolactone 12.5 mg (1/2 tab) daily  Labs drawn today (if we do not call you, then your lab work was stable)   Your physician recommends that you return for lab work in: 10 days  Your physician has requested that you have an echocardiogram. Echocardiography is a painless test that uses sound waves to create images of your heart. It provides your doctor with information about the size and shape of your heart and how well your heart's chambers and valves are working. This procedure takes approximately one hour. There are no restrictions for this procedure.  Your physician recommends that you schedule a follow-up appointment in: 3 months with [name]

## 2016-12-10 NOTE — Progress Notes (Signed)
PCP: Dr. Delena Bali HF Cardiology: [name]  51 yo with history of long-standing, poorly-controlled HTN, CKD stage 3-4, chronic systolic CHF and severe mitral regurgitation presents for followup of CHF and HTN.  He has had hypertension for years.  He has had known CKD, followed by Dr. [name].  He developed severe dyspnea in 12/17 and was admitted with acute systolic CHF.  Echo showed EF 20-25% with severe MR.  He was diuresed and discharged.   TEE was done in 5/18 to assess mitral valve.  There was severe MR.  The MV was thickened and did not coapt well.  Suspect there is a component of secondary MR with moderate LV dilation, however the thickened leaflets suggested a component of primary MR as well.   He had right and left heart cath in 6/18.  No significant CAD, elevated filling pressures.    In 6/18, he had mitral valve repair.  Post-op echo in 7/18 showed EF 25-30% with stable MV repair (mild MR, mean gradient 4 mmHg).    He has been doing well symptomatically.  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  No significant exertional dyspnea, able to do all activities without problems.  Some ankle edema from time to time.  No orthopnea/PND.  No significant exertional dyspnea. BP is high today but left his Coreg at his mother's house.  When he is taking Coreg, SBP 120s-130s when he checks at home.  No chest pain.  No lightheadedness.  No orthopnea/PND.  He is doing cardiac rehab. Weight is up but he reports increased appetite.   Labs (4/18): K 3.7, creatinine 3.05, hgb 10.4 Labs (5/18): K 3.3, creatinine 2.89, BNP 789 Labs (6/18): K 3.4, creatinine 2.4, hgb 10.1 Labs (7/18): K 3.2, creatinine 2.27, BNP 303 Labs (9/18): LDL 78, HDL 56, K 3.8, creatinine 2.16 Labs (10/18): hgb 10.1  PMH: 1. Gout 2. HTN: Long-standing, poor control. 3. CKD: Stage 3-4, followed by Dr. [name].  Likely hypertensive nephropathy.  4. Dilated cardiomyopathy: May be due to long-standing HTN.  - Echo (1/18):  EF 20-25%, severe MR - Echo (2/18): EF 25%, severe MR.  - TEE (5/18): Moderate LV dilation with severe global hypokinesis, EF 25-30%, normal RV size with mildly decreased systolic function, RV-RA gradient 55 mmHg, severe MR with mildly thickened leaflets with inadequate coaptation and ERO 0.52 cm^2 by PISA and systolic flow reversal in the pulmonary vein doppler pattern => secondary MR from dilated LV but thickened leaflets lead to concern for component of primary MR as well.  - RHC/LHC (6/18): No significant CAD.  Mean RA 15, PA 76/31 mean 48, mean PCWP 45, CI 3.02 Fick, 2.64 thermo.  - Echo (7/18): EF 25-30%, mild dilation, diffuse hypokinesis, s/p MV repair with mean gradient 4 mmHg and mild MR, PASP 37 mmHg, normal RV size and systolic function.  5. Severe MR: See TEE report above.  - S/p MV repair in 6/18.  SH: Married, lives in [place], nonsmoker, no drugs, occasional ETOH.  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   Family History  Problem Relation Age of Onset  . Diabetes Mother   . Hypertension Mother   . Heart failure Mother    ROS: All systems reviewed and negative except as per HPI.   Current Outpatient Medications  Medication Sig Dispense Refill  . acetaminophen (TYLENOL) 500 MG tablet Take 1,000 mg by mouth every 6 (six) hours as needed for moderate pain.    Marland Kitchen amLODipine (NORVASC) 10  MG tablet Take 1 tablet (10 mg total) by mouth daily. 30 tablet 3  . aspirin EC 81 MG tablet Take 1 tablet (81 mg total) by mouth daily. 90 tablet 3  . carvedilol (COREG) 12.5 MG tablet Take 2 tablets (25 mg total) by mouth 2 (two) times daily. 60 tablet 1  . cholecalciferol (VITAMIN D) 1000 units tablet Take 1,000 Units by mouth daily.    . FeAspGl-FeFum-B12-FA-C-Succ Ac (FERREX 28 PO) Take 1 tablet by mouth daily.     . Febuxostat (ULORIC) 80 MG TABS Take 80 mg by mouth daily.     . furosemide (LASIX) 40 MG tablet Take 40 mg (1 Tablet) in the AM and 20 mg (0.5 Tablet) in  the PM. 60 tablet 3  . hydrALAZINE (APRESOLINE) 100 MG tablet Take 1 tablet (100 mg total) by mouth 3 (three) times daily. 90 tablet 3  . isosorbide mononitrate (IMDUR) 60 MG 24 hr tablet Take 1 tablet (60 mg total) by mouth daily. 30 tablet 3  . Multiple Vitamin (MULTIVITAMIN WITH MINERALS) TABS tablet Take 1 tablet by mouth 2 (two) times daily.     . potassium chloride (K-DUR) 10 MEQ tablet TAKE 1 TABLET BY MOUTH ONCE DAILY 30 tablet 3  . spironolactone (ALDACTONE) 25 MG tablet Take 1 tablet (25 mg total) by mouth daily. 90 tablet 3   No current facility-administered medications for this encounter.    BP (!) 160/106   Pulse 88   Wt 206 lb 1.9 oz (93.5 kg)   SpO2 99%   BMI 30.42 kg/m  General: NAD Neck: No JVD, no thyromegaly or thyroid nodule.  Lungs: Clear to auscultation bilaterally with normal respiratory effort. CV: Nondisplaced PMI.  Heart regular S1/S2, no S3/S4, 2/6 HSM LLSB/apex.  1+ ankle edema.  No carotid bruit.  Normal pedal pulses.  Abdomen: Soft, nontender, no hepatosplenomegaly, no distention.  Skin: Intact without lesions or rashes.  Neurologic: Alert and oriented x 3.  Psych: Normal affect. Extremities: No clubbing or cyanosis.  HEENT: Normal.   Assessment/Plan: 1. HTN: Possible hypertensive cardiomyopathy and hypertensive nephropathy.  Per patient, BP is controlled when he has all his meds.  - He will pick up his Coreg today and restart.  - Continue amlodipine, hydralazine, Imdur.  2. CKD: Stage 4.  Followed by nephrology. BMET today.   3. Chronic systolic CHF: Echo in 1/06 with EF 25-30%. Nonischemic cardiomyopathy based on cath in 6/18.  Cause of cardiomyopathy may be long-standing, poorly controlled HTN.  NYHA class II symptoms.  Weight up but no JVD on exam.  He is improved symptomatically s/p MV repair.  - Continue Lasix 40 mg bid.  BMET today.   - Continue hydralazine 100 mg tid and Imdur 60 mg daily.   - Continue Coreg 25 mg bid.  - Holding off on  ARB/ACEI/ARNI with elevated creatinine.  - Add spironolactone 12.5 mg daily and stop KCl.  Will need to follow K/creatinine carefully, BMET today and again in 10 days.  - I will arrange for repeat echo, if EF remains low would recommend ICD. Narrow QRS so not CRT candidate.   4. Mitral regurgitation: Severe.  See TEE report above.  I suspect there was a component of functional MR from dilated LV, but MV leaflets were thickened, and I was concerned for a component of primary MR as well.  He had MV repair in 6/18, valve looked ok on post-op echo.  Feels better since MV was repaired. Interestingly, he has a  louder murmur on exam today.   - Repeat echo to follow valve.   Followup in 3 months.    Loralie Champagne 12/10/2016

## 2016-12-11 ENCOUNTER — Encounter (HOSPITAL_COMMUNITY): Payer: Medicaid Other

## 2016-12-12 ENCOUNTER — Ambulatory Visit: Payer: Medicaid Other | Admitting: Physician Assistant

## 2016-12-13 ENCOUNTER — Encounter (HOSPITAL_COMMUNITY)
Admission: RE | Admit: 2016-12-13 | Discharge: 2016-12-13 | Disposition: A | Discharge status: Home / Self Care | Payer: Medicaid Other | Source: Ambulatory Visit | Attending: Cardiology | Admitting: Cardiology

## 2016-12-13 ENCOUNTER — Other Ambulatory Visit (HOSPITAL_COMMUNITY): Payer: Self-pay | Admitting: Cardiology

## 2016-12-19 ENCOUNTER — Other Ambulatory Visit: Payer: Self-pay

## 2016-12-19 ENCOUNTER — Ambulatory Visit (HOSPITAL_BASED_OUTPATIENT_CLINIC_OR_DEPARTMENT_OTHER): Payer: Medicaid Other

## 2016-12-19 ENCOUNTER — Ambulatory Visit (HOSPITAL_COMMUNITY)
Admission: RE | Admit: 2016-12-19 | Discharge: 2016-12-19 | Disposition: A | Discharge status: Home / Self Care | Payer: Medicaid Other | Source: Ambulatory Visit | Attending: Internal Medicine | Admitting: Internal Medicine

## 2016-12-19 DIAGNOSIS — D649 Anemia, unspecified: Secondary | ICD-10-CM | POA: Insufficient documentation

## 2016-12-19 DIAGNOSIS — N184 Chronic kidney disease, stage 4 (severe): Secondary | ICD-10-CM | POA: Diagnosis not present

## 2016-12-19 DIAGNOSIS — I5022 Chronic systolic (congestive) heart failure: Secondary | ICD-10-CM | POA: Diagnosis not present

## 2016-12-19 DIAGNOSIS — I509 Heart failure, unspecified: Secondary | ICD-10-CM | POA: Insufficient documentation

## 2016-12-19 DIAGNOSIS — I272 Pulmonary hypertension, unspecified: Secondary | ICD-10-CM | POA: Diagnosis not present

## 2016-12-19 DIAGNOSIS — R55 Syncope and collapse: Secondary | ICD-10-CM | POA: Diagnosis not present

## 2016-12-19 DIAGNOSIS — I429 Cardiomyopathy, unspecified: Secondary | ICD-10-CM | POA: Diagnosis not present

## 2016-12-19 DIAGNOSIS — Z86711 Personal history of pulmonary embolism: Secondary | ICD-10-CM | POA: Diagnosis not present

## 2016-12-19 LAB — BASIC METABOLIC PANEL
Anion gap: 8 (ref 5–15)
BUN: 33 mg/dL — ABNORMAL HIGH (ref 6–20)
CO2: 30 mmol/L (ref 22–32)
Calcium: 8.7 mg/dL — ABNORMAL LOW (ref 8.9–10.3)
Chloride: 103 mmol/L (ref 101–111)
Creatinine, Ser: 2.5 mg/dL — ABNORMAL HIGH (ref 0.61–1.24)
GFR calc Af Amer: 33 mL/min — ABNORMAL LOW (ref >60)
GFR calc non Af Amer: 28 mL/min — ABNORMAL LOW (ref >60)
Glucose, Bld: 99 mg/dL (ref 65–99)
Potassium: 3.4 mmol/L — ABNORMAL LOW (ref 3.5–5.1)
Sodium: 141 mmol/L (ref 135–145)

## 2016-12-20 ENCOUNTER — Telehealth (HOSPITAL_COMMUNITY): Payer: Self-pay

## 2016-12-20 MED ORDER — POTASSIUM CHLORIDE CRYS ER 20 MEQ PO TBCR
20.0000 meq | EXTENDED_RELEASE_TABLET | Freq: Every day | ORAL | 3 refills | Status: DC
Start: 2016-12-20 — End: 2017-03-09

## 2016-12-20 NOTE — Telephone Encounter (Signed)
User: Cherie Dark A Date/time: 12/13/16 3:40 PM  Comment: Called pt and lmsg for him to CB to get scheduled for an echo.   Context:  Outcome: Left Message  Phone number: [phone] Phone Type: Home Phone  Comm. type: Telephone Call type: Outgoing  Contact: Lawerance Sabal B Relation to patient: Self    User: Cherie Dark A Date/time: 12/08/16 10:26 AM  Comment: Called pt and lmsg for him to CB to get scheduled for eh echo.  Context:  Outcome: Left Message  Phone number: [phone] Phone Type: Home Phone  Comm. type: Telephone Call type: Outgoing  Contact: Lawerance Sabal B Relation to patient: Self

## 2016-12-20 NOTE — Telephone Encounter (Signed)
Notes recorded by Shirley Muscat, RN on 12/20/2016 at 5:01 PM EST Pt aware of results agreeable to med changes. Orders placed  ------  Notes recorded by Larey Dresser, MD on 12/20/2016 at 1:39 PM EST Add 20 mEq daily additional KCl

## 2016-12-22 ENCOUNTER — Telehealth (HOSPITAL_COMMUNITY): Payer: Self-pay

## 2016-12-22 DIAGNOSIS — I5022 Chronic systolic (congestive) heart failure: Secondary | ICD-10-CM

## 2016-12-22 NOTE — Telephone Encounter (Signed)
Notes recorded by Shirley Muscat, RN on 12/22/2016 at 2:26 PM EST Pt aware of results order for echo placed ([facility] will call him, [facility] sent message)  ------  Notes recorded by Shirley Muscat, RN on 12/21/2016 at 3:33 PM EST Left VM  07/21/2016 EF was 25-30% ------  Notes recorded by Larey Dresser, MD on 12/21/2016 at 2:16 PM EST EF 30-35%, some improvement. Before placing ICD would repeat echo in 3 months.

## 2016-12-27 ENCOUNTER — Encounter (HOSPITAL_COMMUNITY): Payer: Self-pay

## 2016-12-27 NOTE — Progress Notes (Signed)
Discharge Progress Report  Patient Details  Name: Vernon Blackburn MRN: [id number] Date of Birth: [birth year] Referring Provider:     [place] from 09/19/2016 in [place]  Referring Provider  Fransico Him MD and Loralie Champagne MD       Number of Visits: 11 Reason for Discharge:  Early Exit:  Back to work  Smoking History:  Social History   Tobacco Use  Smoking Status Never Smoker  Smokeless Tobacco Never Used    Diagnosis:  No diagnosis found.  ADL UCSD:   Initial Exercise Prescription: Initial Exercise Prescription - 09/19/16 0900      Date of Initial Exercise RX and Referring Provider   Date  09/19/16    Referring Provider  Fransico Him MD and Loralie Champagne MD      Treadmill   MPH  3    Grade  1    Minutes  10    METs  3.71      Bike   Level  1    Minutes  10    METs  3.22      NuStep   Level  3    SPM  80    Minutes  10    METs  2.5      Prescription Details   Frequency (times per week)  3    Duration  Progress to 30 minutes of continuous aerobic without signs/symptoms of physical distress      Intensity   THRR 40-80% of Max Heartrate  68-136    Ratings of Perceived Exertion  11-13    Perceived Dyspnea  0-4      Progression   Progression  Continue to progress workloads to maintain intensity without signs/symptoms of physical distress.      Resistance Training   Training Prescription  Yes    Weight  3lbs    Reps  10-15       Discharge Exercise Prescription (Final Exercise Prescription Changes): Exercise Prescription Changes - 12/08/16 0818      Response to Exercise   Blood Pressure (Admit)  156/92    Blood Pressure (Exercise)  150/100    Blood Pressure (Exit)  130/90    Heart Rate (Admit)  88 bpm    Heart Rate (Exercise)  123 bpm    Heart Rate (Exit)  92 bpm    Rating of Perceived Exertion (Exercise)  12    Symptoms  none    Duration  Continue with 30 min of aerobic  exercise without signs/symptoms of physical distress.    Intensity  THRR unchanged      Progression   Progression  Continue to progress workloads to maintain intensity without signs/symptoms of physical distress.    Average METs  4.3      Resistance Training   Training Prescription  Yes    Weight  3lbs    Reps  10-15    Time  10 Minutes      Interval Training   Interval Training  No      Treadmill   MPH  3.4    Grade  2    Minutes  10    METs  4.54      Bike   Level  1.5    Minutes  10    METs  4.01      Home Exercise Plan   Plans to continue exercise at  Home (comment)    Frequency  Add  3 additional days to program exercise sessions.    Initial Home Exercises Provided  10/04/16       Functional Capacity: 6 Minute Walk    Row Name 09/19/16 0907 12/06/16 1114 12/13/16 1046     6 Minute Walk   Phase  Initial  Discharge  Discharge   Distance  1738 feet  1744 feet  -   Distance % Change  -  0.35 %  -   Distance Feet Change  -  6 ft  -   Walk Time  6 minutes  6 minutes  -   # of Rest Breaks  0  0  -   MPH  3.3  3.3  -   METS  4.7  5.13  -   RPE  9  9  -   Perceived Dyspnea   -  0  -   VO2 Peak  16.6  17.98  -   Symptoms  No  No  -   Resting HR  71 bpm  98 bpm  -   Resting BP  131/91  140/82  -   Resting Oxygen Saturation   100 %  -  -   Exercise Oxygen Saturation  during 6 min walk  100 %  -  -   Max Ex. HR  94 bpm  122 bpm  -   Max Ex. BP  139/96  164/94  -   2 Minute Post BP  114/82  140/94  -      Psychological, QOL, Others - Outcomes: PHQ 2/9: Depression screen [facility] 2/9 12/27/2016 09/27/2016  Decreased Interest 0 0  Down, Depressed, Hopeless 0 0  PHQ - 2 Score 0 0    Quality of Life: Quality of Life - 12/08/16 0720      Quality of Life Scores   Health/Function Pre  24.4 %    Health/Function Post  22.1 %    Health/Function % Change  -9.43 %    Socioeconomic Pre  26.1 %    Socioeconomic Post  26.88 %    Socioeconomic % Change   2.99 %     Psych/Spiritual Pre  28.2 %    Psych/Spiritual Post  28.93 %    Psych/Spiritual % Change  2.59 %    Family Pre  20.4 %    Family Post  21.5 %    Family % Change  5.39 %    GLOBAL Pre  25 %    GLOBAL Post  24.47 %    GLOBAL % Change  -2.12 %       Personal Goals: Goals established at orientation with interventions provided to work toward goal. Personal Goals and Risk Factors at Admission - 09/19/16 1033      Core Components/Risk Factors/Patient Goals on Admission   Admit Weight  190 lb 4.1 oz (86.3 kg)        Personal Goals Discharge: Goals and Risk Factor Review    Row Name 10/10/16 1522 10/25/16 0745 10/31/16 0744 11/28/16 1547 12/27/16 1602     Core Components/Risk Factors/Patient Goals Review   Personal Goals Review  Weight Management/Obesity;Heart Failure;Hypertension;Stress  Weight Management/Obesity;Heart Failure;Hypertension;Stress  Weight Management/Obesity;Heart Failure;Hypertension;Stress  Weight Management/Obesity;Heart Failure;Hypertension;Stress  Weight Management/Obesity;Heart Failure;Hypertension;Stress   Review  pt demonstrates eagerness to participate in CR offerings. pt diastolic BP elevated at CR at rest and with exercise. Cardiologist made aware.  pt with high stress profession and family situational stress.   pt demonstrates eagerness to participate in  CR offerings. pt diastolic BP elevated at CR at rest and with exercise. Cardiologist made aware.  pt with high stress profession and family situational stress.   pt demonstrates eagerness to participate in CR offerings. pt diastolic BP improving at CR at rest and with exercise.  pt with high stress profession and family situational stress.   pt demonstrates eagerness to participate in CR offerings. pt diastolic BP continues to exhibit improvement at CR at rest and with exercise.  pt with high stress profession and family situational stress.   pt exited program early due to work schedule conflict.   pt with high stress  profession and family situational stress. pt plans to continue exercising on his own with personal trainer.    Expected Outcomes  pt will participate in CR exercise, nutrition and lifestyle modification opportunities to decrease overall RF.    pt will participate in CR exercise, nutrition and lifestyle modification opportunities to decrease overall RF.    pt will participate in CR exercise, nutrition and lifestyle modification opportunities to decrease overall RF.    pt will participate in CR exercise, nutrition and lifestyle modification opportunities to decrease overall RF.    pt will participate in  exercise, nutrition and lifestyle modification opportunities to decrease overall RF.        Exercise Goals and Review: Exercise Goals    Row Name 09/19/16 3235             Exercise Goals   Increase Physical Activity  Yes       Intervention  Provide advice, education, support and counseling about physical activity/exercise needs.;Develop an individualized exercise prescription for aerobic and resistive training based on initial evaluation findings, risk stratification, comorbidities and participant's personal goals.       Expected Outcomes  Achievement of increased cardiorespiratory fitness and enhanced flexibility, muscular endurance and strength shown through measurements of functional capacity and personal statement of participant.       Increase Strength and Stamina  Yes increase muscle mass       Intervention  Provide advice, education, support and counseling about physical activity/exercise needs.;Develop an individualized exercise prescription for aerobic and resistive training based on initial evaluation findings, risk stratification, comorbidities and participant's personal goals.       Expected Outcomes  Achievement of increased cardiorespiratory fitness and enhanced flexibility, muscular endurance and strength shown through measurements of functional capacity and personal statement of  participant.       Able to understand and use rate of perceived exertion (RPE) scale  Yes       Intervention  Provide education and explanation on how to use RPE scale       Expected Outcomes  Short Term: Able to use RPE daily in rehab to express subjective intensity level;Long Term:  Able to use RPE to guide intensity level when exercising independently       Knowledge and understanding of Target Heart Rate Range (THRR)  Yes       Intervention  Provide education and explanation of THRR including how the numbers were predicted and where they are located for reference       Expected Outcomes  Short Term: Able to state/look up THRR;Long Term: Able to use THRR to govern intensity when exercising independently;Short Term: Able to use daily as guideline for intensity in rehab       Able to check pulse independently  Yes       Intervention  Provide education and demonstration on how to  check pulse in carotid and radial arteries.;Review the importance of being able to check your own pulse for safety during independent exercise       Expected Outcomes  Short Term: Able to explain why pulse checking is important during independent exercise;Long Term: Able to check pulse independently and accurately       Understanding of Exercise Prescription  Yes       Intervention  Provide education, explanation, and written materials on patient's individual exercise prescription       Expected Outcomes  Short Term: Able to explain program exercise prescription;Long Term: Able to explain home exercise prescription to exercise independently          Nutrition & Weight - Outcomes:  Post Biometrics - 12/06/16 1116       Post  Biometrics   Height  5' 9.02" (1.753 m)    Weight  205 lb 7.5 oz (93.2 kg)    Waist Circumference  37 inches    Hip Circumference  40 inches    Waist to Hip Ratio  0.92 %    BMI (Calculated)  30.33    Triceps Skinfold  12 mm    % Body Fat  25.4 %    Grip Strength  51 kg    Flexibility  15 in     Single Leg Stand  10 seconds       Nutrition: Nutrition Therapy & Goals - 12/13/16 1142      Nutrition Therapy   Diet  Heart Healthy      Intervention Plan   Intervention  Prescribe, educate and counsel regarding individualized specific dietary modifications aiming towards targeted core components such as weight, hypertension, lipid management, diabetes, heart failure and other comorbidities.    Expected Outcomes  Short Term Goal: Understand basic principles of dietary content, such as calories, fat, sodium, cholesterol and nutrients.;Long Term Goal: Adherence to prescribed nutrition plan.       Nutrition Discharge: Nutrition Assessments - 12/13/16 1137      MEDFICTS Scores   Pre Score  16    Post Score  16    Score Difference  0       Education Questionnaire Score: Knowledge Questionnaire Score - 12/08/16 0720      Knowledge Questionnaire Score   Pre Score  21/24    Post Score  22/24       Goals reviewed with patient; copy given to patient.

## 2017-01-16 ENCOUNTER — Other Ambulatory Visit (HOSPITAL_COMMUNITY): Payer: Self-pay | Admitting: *Deleted

## 2017-01-16 DIAGNOSIS — I10 Essential (primary) hypertension: Secondary | ICD-10-CM

## 2017-01-16 MED ORDER — CARVEDILOL 25 MG PO TABS: 25.0000 mg | ORAL_TABLET | Freq: Two times a day (BID) | ORAL | 3 refills | Status: DC

## 2017-01-29 ENCOUNTER — Telehealth (HOSPITAL_COMMUNITY): Payer: Self-pay | Admitting: Cardiology

## 2017-01-29 NOTE — Telephone Encounter (Signed)
Pt aware and agreeable to plan no further questions at this time

## 2017-01-29 NOTE — Telephone Encounter (Signed)
Abnormal labs received from [place] drawn 01/27/17 Cr 3.33 BUN35 K 3.8  PER VO Amy Clegg,NP-C Stop spiro   Left message for patient to return call regarding lab results

## 2017-02-08 ENCOUNTER — Other Ambulatory Visit (HOSPITAL_COMMUNITY): Payer: Self-pay | Admitting: Cardiology

## 2017-02-09 ENCOUNTER — Ambulatory Visit: Payer: Medicaid Other | Admitting: Gastroenterology

## 2017-03-02 ENCOUNTER — Other Ambulatory Visit (HOSPITAL_COMMUNITY): Payer: Self-pay | Admitting: Cardiology

## 2017-03-05 ENCOUNTER — Other Ambulatory Visit (HOSPITAL_COMMUNITY): Payer: Self-pay | Admitting: *Deleted

## 2017-03-05 MED ORDER — AMLODIPINE BESYLATE 10 MG PO TABS: 10.0000 mg | ORAL_TABLET | Freq: Every day | ORAL | 3 refills | Status: DC

## 2017-03-09 ENCOUNTER — Other Ambulatory Visit: Payer: Self-pay

## 2017-03-09 ENCOUNTER — Encounter (HOSPITAL_COMMUNITY): Payer: Self-pay | Admitting: Cardiology

## 2017-03-09 ENCOUNTER — Ambulatory Visit (HOSPITAL_COMMUNITY)
Admission: RE | Admit: 2017-03-09 | Discharge: 2017-03-09 | Disposition: A | Discharge status: Home / Self Care | Payer: Medicaid Other | Source: Ambulatory Visit | Attending: Cardiology | Admitting: Cardiology

## 2017-03-09 VITALS — BP 121/84 | HR 86 | Wt 213.5 lb

## 2017-03-09 DIAGNOSIS — I340 Nonrheumatic mitral (valve) insufficiency: Secondary | ICD-10-CM | POA: Insufficient documentation

## 2017-03-09 DIAGNOSIS — I428 Other cardiomyopathies: Secondary | ICD-10-CM | POA: Diagnosis not present

## 2017-03-09 DIAGNOSIS — N184 Chronic kidney disease, stage 4 (severe): Secondary | ICD-10-CM | POA: Diagnosis not present

## 2017-03-09 DIAGNOSIS — Z9889 Other specified postprocedural states: Secondary | ICD-10-CM | POA: Diagnosis not present

## 2017-03-09 DIAGNOSIS — I5022 Chronic systolic (congestive) heart failure: Secondary | ICD-10-CM | POA: Diagnosis not present

## 2017-03-09 DIAGNOSIS — Z79899 Other long term (current) drug therapy: Secondary | ICD-10-CM | POA: Insufficient documentation

## 2017-03-09 DIAGNOSIS — Z7982 Long term (current) use of aspirin: Secondary | ICD-10-CM | POA: Insufficient documentation

## 2017-03-09 DIAGNOSIS — I130 Hypertensive heart and chronic kidney disease with heart failure and stage 1 through stage 4 chronic kidney disease, or unspecified chronic kidney disease: Secondary | ICD-10-CM | POA: Diagnosis not present

## 2017-03-09 DIAGNOSIS — M109 Gout, unspecified: Secondary | ICD-10-CM | POA: Diagnosis not present

## 2017-03-09 LAB — BASIC METABOLIC PANEL
Anion gap: 10 (ref 5–15)
BUN: 40 mg/dL — ABNORMAL HIGH (ref 6–20)
CO2: 24 mmol/L (ref 22–32)
Calcium: 8.5 mg/dL — ABNORMAL LOW (ref 8.9–10.3)
Chloride: 107 mmol/L (ref 101–111)
Creatinine, Ser: 2.75 mg/dL — ABNORMAL HIGH (ref 0.61–1.24)
GFR calc Af Amer: 29 mL/min — ABNORMAL LOW (ref >60)
GFR calc non Af Amer: 25 mL/min — ABNORMAL LOW (ref >60)
Glucose, Bld: 83 mg/dL (ref 65–99)
Potassium: 3.9 mmol/L (ref 3.5–5.1)
Sodium: 141 mmol/L (ref 135–145)

## 2017-03-09 MED ORDER — SPIRONOLACTONE 25 MG PO TABS: 12.5000 mg | ORAL_TABLET | Freq: Every day | ORAL | 3 refills | Status: DC

## 2017-03-09 NOTE — Progress Notes (Addendum)
PCP: Dr. Delena Bali HF Cardiology: [name]  52 yo with history of long-standing, poorly-controlled HTN, CKD stage 3-4, chronic systolic CHF and severe mitral regurgitation presents for followup of CHF and HTN.  He has had hypertension for years.  He has had known CKD, followed by Dr. [name].  He developed severe dyspnea in 12/17 and was admitted with acute systolic CHF.  Echo showed EF 20-25% with severe MR.  He was diuresed and discharged.   TEE was done in 5/18 to assess mitral valve.  There was severe MR.  The MV was thickened and did not coapt well.  Suspect there is a component of secondary MR with moderate LV dilation, however the thickened leaflets suggested a component of primary MR as well.   He had right and left heart cath in 6/18.  No significant CAD, elevated filling pressures.    In 6/18, he had mitral valve repair.  Post-op echo in 7/18 showed EF 25-30% with stable MV repair (mild MR, mean gradient 4 mmHg).  Echo 12/18 with EF 30-35%, moderate LV dilation, s/p MVR repair with trivial MR.   He has been doing well symptomatically.  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  He reports no significant dyspnea.  Working long hours, not much time for exercise and not eating well.  Weight is up 7 lbs.  No orthopnea/PND.  No chest pain.    Labs (4/18): K 3.7, creatinine 3.05, hgb 10.4 Labs (5/18): K 3.3, creatinine 2.89, BNP 789 Labs (6/18): K 3.4, creatinine 2.4, hgb 10.1 Labs (7/18): K 3.2, creatinine 2.27, BNP 303 Labs (9/18): LDL 78, HDL 56, K 3.8, creatinine 2.16 Labs (10/18): hgb 10.1 Labs (2/19): K 3.3, creatinine 2.75, hgb 12.1  PMH: 1. Gout 2. HTN: Long-standing, poor control. 3. CKD: Stage 3-4, followed by Dr. [name].  Likely hypertensive nephropathy.  4. Dilated cardiomyopathy: May be due to long-standing HTN.  - Echo (1/18): EF 20-25%, severe MR - Echo (2/18): EF 25%, severe MR.  - TEE (5/18): Moderate LV dilation with severe global hypokinesis, EF 25-30%,  normal RV size with mildly decreased systolic function, RV-RA gradient 55 mmHg, severe MR with mildly thickened leaflets with inadequate coaptation and ERO 0.52 cm^2 by PISA and systolic flow reversal in the pulmonary vein doppler pattern => secondary MR from dilated LV but thickened leaflets lead to concern for component of primary MR as well.  - RHC/LHC (6/18): No significant CAD.  Mean RA 15, PA 76/31 mean 48, mean PCWP 45, CI 3.02 Fick, 2.64 thermo.  - Echo (7/18): EF 25-30%, mild dilation, diffuse hypokinesis, s/p MV repair with mean gradient 4 mmHg and mild MR, PASP 37 mmHg, normal RV size and systolic function.  - Echo (12/18): EF 30-35%, moderate LV dilation with diffuse hypokinesis, stable MV repair.  5. Severe MR: See TEE report above.  - S/p MV repair in 6/18.  SH: Married, lives in [place], nonsmoker, no drugs, occasional ETOH.  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   Family History  Problem Relation Age of Onset  . Diabetes Mother   . Hypertension Mother   . Heart failure Mother    ROS: All systems reviewed and negative except as per HPI.   Current Outpatient Medications  Medication Sig Dispense Refill  . acetaminophen (TYLENOL) 500 MG tablet Take 1,000 mg by mouth every 6 (six) hours as needed for moderate pain.    Marland Kitchen amLODipine (NORVASC) 10 MG tablet Take 1 tablet (10 mg  total) by mouth daily. 30 tablet 3  . aspirin EC 81 MG tablet Take 1 tablet (81 mg total) by mouth daily. 90 tablet 3  . carvedilol (COREG) 25 MG tablet Take 1 tablet (25 mg total) by mouth 2 (two) times daily. 60 tablet 3  . cholecalciferol (VITAMIN D) 1000 units tablet Take 1,000 Units by mouth daily.    . FeAspGl-FeFum-B12-FA-C-Succ Ac (FERREX 28 PO) Take 1 tablet by mouth daily.     . Febuxostat (ULORIC) 80 MG TABS Take 80 mg by mouth daily.     . furosemide (LASIX) 40 MG tablet Take 40 mg (1 Tablet) in the AM and 20 mg (0.5 Tablet) in the PM. 60 tablet 3  . hydrALAZINE  (APRESOLINE) 100 MG tablet Take 1 tablet (100 mg total) by mouth 3 (three) times daily. 90 tablet 3  . isosorbide mononitrate (IMDUR) 60 MG 24 hr tablet Take 1 tablet (60 mg total) by mouth daily. 30 tablet 3  . Multiple Vitamin (MULTIVITAMIN WITH MINERALS) TABS tablet Take 1 tablet by mouth 2 (two) times daily.     Marland Kitchen spironolactone (ALDACTONE) 25 MG tablet Take 0.5 tablets (12.5 mg total) by mouth daily. 15 tablet 3   No current facility-administered medications for this encounter.    BP 121/84   Pulse 86   Wt 213 lb 8 oz (96.8 kg)   SpO2 100%   BMI 31.51 kg/m  General: NAD Neck: No JVD, no thyromegaly or thyroid nodule.  Lungs: Clear to auscultation bilaterally with normal respiratory effort. CV: Nondisplaced PMI.  Heart regular S1/S2, no S3/S4, no murmur.  No peripheral edema.  No carotid bruit.  Normal pedal pulses.  Abdomen: Soft, nontender, no hepatosplenomegaly, no distention.  Skin: Intact without lesions or rashes.  Neurologic: Alert and oriented x 3.  Psych: Normal affect. Extremities: No clubbing or cyanosis.  HEENT: Normal.   Assessment/Plan: 1. HTN: Possible hypertensive cardiomyopathy and hypertensive nephropathy.  BP now controlled.  2. CKD: Stage 4.  Followed by nephrology. BMET today.   3. Chronic systolic CHF: Echo in 3/53 with EF 25-30%. Nonischemic cardiomyopathy based on cath in 6/18.  Cause of cardiomyopathy may be long-standing, poorly controlled HTN.  NYHA class II symptoms.  Weight up but no JVD on exam, think likely due to poor diet and lack of exercise rather than CHF.  He is improved symptomatically s/p MV repair.  Last echo in 12/18 with EF 30-35%.  - Continue Lasix 40 qam/20 qpm.  BMET today.   - Continue hydralazine 100 mg tid and Imdur 60 mg daily.   - Continue Coreg 25 mg bid.  - Holding off on ARB/ACEI/ARNI with elevated creatinine.  - I will try him again on spironolactone 12.5 mg daily.  Follow K and creatinine closely (BMET today and again in 10  days).  Stop KCl supplement.  - EF was somewhat improved on 12/18 echo but still within ICD range.  I am going to set him up for another echo to see if EF continues to improve.  If it remains < 35%, I would recommend ICD.  He would not be a CRT candidate.    4. Mitral regurgitation: s/p MV repair for severe MR.  Much improved s/p surgery.  - Will need abx with dental work given prosthetic material.   Followup in 3 months.    Loralie Champagne 03/10/2017

## 2017-03-09 NOTE — Patient Instructions (Signed)
Stop potassium  Start Spironolactone 12.5 mg (1/2 tab) daily  Your physician has requested that you have an echocardiogram. Echocardiography is a painless test that uses sound waves to create images of your heart. It provides your doctor with information about the size and shape of your heart and how well your heart's chambers and valves are working. This procedure takes approximately one hour. There are no restrictions for this procedure.    They will call you  Labs drawn today (if we do not call you, then your lab work was stable)   Your physician recommends that you return for lab work in: 10 days  Your physician recommends that you schedule a follow-up appointment in: 3 months with [name]

## 2017-03-13 ENCOUNTER — Other Ambulatory Visit (HOSPITAL_COMMUNITY): Payer: Self-pay | Admitting: Cardiology

## 2017-03-20 ENCOUNTER — Other Ambulatory Visit (HOSPITAL_COMMUNITY): Payer: Medicaid Other

## 2017-03-20 ENCOUNTER — Ambulatory Visit (HOSPITAL_COMMUNITY): Payer: Medicaid Other

## 2017-03-23 ENCOUNTER — Encounter (HOSPITAL_COMMUNITY): Payer: Self-pay | Admitting: *Deleted

## 2017-03-23 NOTE — Progress Notes (Signed)
Received medical records request from [facility], CASE # [id number]  Requested records faxed today to [phone].  Original request will be scanned to patient's electronic medical record.

## 2017-03-27 ENCOUNTER — Ambulatory Visit: Payer: Medicaid Other | Admitting: Gastroenterology

## 2017-03-28 ENCOUNTER — Other Ambulatory Visit (HOSPITAL_COMMUNITY): Payer: Medicaid Other

## 2017-03-29 ENCOUNTER — Telehealth (HOSPITAL_COMMUNITY): Payer: Self-pay

## 2017-03-29 NOTE — Telephone Encounter (Signed)
Pt cancelled and rescheduled appt on 3/19, 3/21, and 3/27

## 2017-04-03 ENCOUNTER — Telehealth (HOSPITAL_COMMUNITY): Payer: Self-pay | Admitting: Cardiology

## 2017-04-03 DIAGNOSIS — I5022 Chronic systolic (congestive) heart failure: Secondary | ICD-10-CM

## 2017-04-03 NOTE — Telephone Encounter (Signed)
Abnormal labs drawn at PCP Labs drawn 03/31/17 Cr 3.49 BUN 51 NA 142 K 3.9  Per VO Rebecca Eaton Stop spiro and repeat in one week  Pt aware and voiced understanding. Repeat labs 4/10

## 2017-04-06 ENCOUNTER — Other Ambulatory Visit (HOSPITAL_COMMUNITY): Payer: Self-pay | Admitting: Cardiology

## 2017-04-07 ENCOUNTER — Other Ambulatory Visit (HOSPITAL_COMMUNITY): Payer: Self-pay | Admitting: Cardiology

## 2017-04-11 ENCOUNTER — Other Ambulatory Visit (HOSPITAL_COMMUNITY): Payer: Medicaid Other

## 2017-04-11 ENCOUNTER — Other Ambulatory Visit (HOSPITAL_COMMUNITY): Payer: Self-pay | Admitting: *Deleted

## 2017-04-11 MED ORDER — HYDRALAZINE HCL 100 MG PO TABS: 100.0000 mg | ORAL_TABLET | Freq: Three times a day (TID) | ORAL | 3 refills | Status: DC

## 2017-04-13 ENCOUNTER — Ambulatory Visit (HOSPITAL_COMMUNITY): Payer: Medicaid Other | Attending: Cardiology

## 2017-04-13 ENCOUNTER — Encounter (INDEPENDENT_AMBULATORY_CARE_PROVIDER_SITE_OTHER): Payer: Self-pay

## 2017-04-13 ENCOUNTER — Other Ambulatory Visit: Payer: Self-pay

## 2017-04-13 DIAGNOSIS — N184 Chronic kidney disease, stage 4 (severe): Secondary | ICD-10-CM | POA: Diagnosis not present

## 2017-04-13 DIAGNOSIS — I429 Cardiomyopathy, unspecified: Secondary | ICD-10-CM | POA: Insufficient documentation

## 2017-04-13 DIAGNOSIS — I130 Hypertensive heart and chronic kidney disease with heart failure and stage 1 through stage 4 chronic kidney disease, or unspecified chronic kidney disease: Secondary | ICD-10-CM | POA: Insufficient documentation

## 2017-04-13 DIAGNOSIS — D649 Anemia, unspecified: Secondary | ICD-10-CM | POA: Diagnosis not present

## 2017-04-13 DIAGNOSIS — I5022 Chronic systolic (congestive) heart failure: Secondary | ICD-10-CM | POA: Insufficient documentation

## 2017-04-13 DIAGNOSIS — I272 Pulmonary hypertension, unspecified: Secondary | ICD-10-CM | POA: Insufficient documentation

## 2017-04-18 ENCOUNTER — Telehealth (HOSPITAL_COMMUNITY): Payer: Self-pay | Admitting: *Deleted

## 2017-04-18 DIAGNOSIS — Z736 Limitation of activities due to disability: Secondary | ICD-10-CM

## 2017-04-18 NOTE — Telephone Encounter (Signed)
Patient called asking for his recent echocardiogram results.  Result given and patient was very pleased.  No further questions.

## 2017-04-26 ENCOUNTER — Telehealth (HOSPITAL_COMMUNITY): Payer: Self-pay | Admitting: *Deleted

## 2017-04-26 NOTE — Telephone Encounter (Signed)
Received medical record request from [facility].  Requested records faxed today to [phone].

## 2017-05-04 ENCOUNTER — Other Ambulatory Visit (HOSPITAL_COMMUNITY): Payer: Self-pay | Admitting: Cardiology

## 2017-05-17 ENCOUNTER — Telehealth (HOSPITAL_COMMUNITY): Payer: Self-pay | Admitting: Cardiology

## 2017-05-17 DIAGNOSIS — I5022 Chronic systolic (congestive) heart failure: Secondary | ICD-10-CM

## 2017-05-17 MED ORDER — POTASSIUM CHLORIDE CRYS ER 20 MEQ PO TBCR: 40.0000 meq | EXTENDED_RELEASE_TABLET | Freq: Every day | ORAL | 3 refills | Status: DC

## 2017-05-17 NOTE — Telephone Encounter (Signed)
Abnormal lab results received Labs drawn 05/12/17 K 3.1 Cr 3.16 BUN 34 Na 144  Per Caryl Pina Smith,NP Increase KcL to 40 meq daily and recheck labs x 1 week. Call to confirm Dr [name] agrees  Dr [name] agrees to medication changes  Patient aware of plan and voiced understanding

## 2017-05-25 ENCOUNTER — Ambulatory Visit (HOSPITAL_COMMUNITY)
Admission: RE | Admit: 2017-05-25 | Discharge: 2017-05-25 | Disposition: A | Discharge status: Home / Self Care | Payer: Medicaid Other | Source: Ambulatory Visit | Attending: Internal Medicine | Admitting: Internal Medicine

## 2017-05-25 DIAGNOSIS — I5022 Chronic systolic (congestive) heart failure: Secondary | ICD-10-CM | POA: Insufficient documentation

## 2017-05-25 LAB — BASIC METABOLIC PANEL
Anion gap: 9 (ref 5–15)
BUN: 29 mg/dL — ABNORMAL HIGH (ref 6–20)
CO2: 25 mmol/L (ref 22–32)
Calcium: 8.5 mg/dL — ABNORMAL LOW (ref 8.9–10.3)
Chloride: 106 mmol/L (ref 101–111)
Creatinine, Ser: 3.07 mg/dL — ABNORMAL HIGH (ref 0.61–1.24)
GFR calc Af Amer: 26 mL/min — ABNORMAL LOW (ref >60)
GFR calc non Af Amer: 22 mL/min — ABNORMAL LOW (ref >60)
Glucose, Bld: 112 mg/dL — ABNORMAL HIGH (ref 65–99)
Potassium: 3.6 mmol/L (ref 3.5–5.1)
Sodium: 140 mmol/L (ref 135–145)

## 2017-05-28 ENCOUNTER — Other Ambulatory Visit (HOSPITAL_COMMUNITY): Payer: Self-pay | Admitting: Cardiology

## 2017-05-28 DIAGNOSIS — I10 Essential (primary) hypertension: Secondary | ICD-10-CM

## 2017-06-08 ENCOUNTER — Ambulatory Visit: Payer: Medicaid Other | Admitting: Podiatry

## 2017-06-11 ENCOUNTER — Ambulatory Visit (HOSPITAL_COMMUNITY)
Admission: RE | Admit: 2017-06-11 | Discharge: 2017-06-11 | Disposition: A | Discharge status: Home / Self Care | Payer: Medicaid Other | Source: Ambulatory Visit | Attending: Cardiology | Admitting: Cardiology

## 2017-06-11 ENCOUNTER — Other Ambulatory Visit: Payer: Self-pay

## 2017-06-11 VITALS — BP 118/80 | HR 81 | Wt 218.4 lb

## 2017-06-11 DIAGNOSIS — J45909 Unspecified asthma, uncomplicated: Secondary | ICD-10-CM | POA: Insufficient documentation

## 2017-06-11 DIAGNOSIS — M109 Gout, unspecified: Secondary | ICD-10-CM | POA: Diagnosis not present

## 2017-06-11 DIAGNOSIS — Z9889 Other specified postprocedural states: Secondary | ICD-10-CM

## 2017-06-11 DIAGNOSIS — I4581 Long QT syndrome: Secondary | ICD-10-CM | POA: Diagnosis not present

## 2017-06-11 DIAGNOSIS — I340 Nonrheumatic mitral (valve) insufficiency: Secondary | ICD-10-CM | POA: Insufficient documentation

## 2017-06-11 DIAGNOSIS — Z7982 Long term (current) use of aspirin: Secondary | ICD-10-CM | POA: Insufficient documentation

## 2017-06-11 DIAGNOSIS — Z8249 Family history of ischemic heart disease and other diseases of the circulatory system: Secondary | ICD-10-CM | POA: Diagnosis not present

## 2017-06-11 DIAGNOSIS — N184 Chronic kidney disease, stage 4 (severe): Secondary | ICD-10-CM | POA: Diagnosis not present

## 2017-06-11 DIAGNOSIS — Z79899 Other long term (current) drug therapy: Secondary | ICD-10-CM | POA: Diagnosis not present

## 2017-06-11 DIAGNOSIS — I5022 Chronic systolic (congestive) heart failure: Secondary | ICD-10-CM | POA: Diagnosis not present

## 2017-06-11 DIAGNOSIS — I130 Hypertensive heart and chronic kidney disease with heart failure and stage 1 through stage 4 chronic kidney disease, or unspecified chronic kidney disease: Secondary | ICD-10-CM | POA: Diagnosis not present

## 2017-06-11 DIAGNOSIS — I272 Pulmonary hypertension, unspecified: Secondary | ICD-10-CM | POA: Diagnosis not present

## 2017-06-11 DIAGNOSIS — I420 Dilated cardiomyopathy: Secondary | ICD-10-CM | POA: Insufficient documentation

## 2017-06-11 LAB — BASIC METABOLIC PANEL
Anion gap: 8 (ref 5–15)
BUN: 35 mg/dL — ABNORMAL HIGH (ref 6–20)
CO2: 24 mmol/L (ref 22–32)
Calcium: 8.7 mg/dL — ABNORMAL LOW (ref 8.9–10.3)
Chloride: 111 mmol/L (ref 101–111)
Creatinine, Ser: 3.1 mg/dL — ABNORMAL HIGH (ref 0.61–1.24)
GFR calc Af Amer: 25 mL/min — ABNORMAL LOW (ref >60)
GFR calc non Af Amer: 22 mL/min — ABNORMAL LOW (ref >60)
Glucose, Bld: 97 mg/dL (ref 65–99)
Potassium: 3.5 mmol/L (ref 3.5–5.1)
Sodium: 143 mmol/L (ref 135–145)

## 2017-06-11 MED ORDER — ALBUTEROL SULFATE HFA 108 (90 BASE) MCG/ACT IN AERS: 2.0000 | INHALATION_SPRAY | Freq: Four times a day (QID) | RESPIRATORY_TRACT | 2 refills | Status: DC | PRN

## 2017-06-11 NOTE — Patient Instructions (Signed)
Start Albuterol for Asthma   Labs drawn today (if we do not call you, then your lab work was stable)   Your physician recommends that you schedule a follow-up appointment in: 4 months with [name]  Please Call an Schedule Appointment

## 2017-06-11 NOTE — Progress Notes (Signed)
PCP: Dr. Delena Bali HF Cardiology: [name]  52 yo with history of long-standing, poorly-controlled HTN, CKD stage 3-4, chronic systolic CHF and severe mitral regurgitation presents for followup of CHF and HTN.  He has had hypertension for years.  He has had known CKD, followed by Dr. [name].  He developed severe dyspnea in 12/17 and was admitted with acute systolic CHF.  Echo showed EF 20-25% with severe MR.  He was diuresed and discharged.   TEE was done in 5/18 to assess mitral valve.  There was severe MR.  The MV was thickened and did not coapt well.  Suspect there was a component of secondary MR with moderate LV dilation, however the thickened leaflets suggested a component of primary MR as well.   He had right and left heart cath in 6/18.  No significant CAD, elevated filling pressures.    In 6/18, he had mitral valve repair.  Post-op echo in 7/18 showed EF 25-30% with stable MV repair (mild MR, mean gradient 4 mmHg).  Echo 12/18 with EF 30-35%, moderate LV dilation, s/p MVR repair with trivial MR.  Echo in 4/19 showed EF up to 40-45% with stable repaired mitral valve.   Spironolactone stopped with elevated creatinine.   He has been doing well symptomatically.  Working in the [employer] office in [place].  Creatinine has been running higher recently.  He has started doing some jogging around a track.  No significant exertional dyspnea or chest pain. No orthopnea/PND.  He has occasional wheezing that is not exertional and seems related to history of asthma.  He was married this weekend and is going to the [place] his honeymoon this week.   Labs (4/18): K 3.7, creatinine 3.05, hgb 10.4 Labs (5/18): K 3.3, creatinine 2.89, BNP 789 Labs (6/18): K 3.4, creatinine 2.4, hgb 10.1 Labs (7/18): K 3.2, creatinine 2.27, BNP 303 Labs (9/18): LDL 78, HDL 56, K 3.8, creatinine 2.16 Labs (10/18): hgb 10.1 Labs (2/19): K 3.3, creatinine 2.75, hgb 12.1 Labs (5/19): K 3.6, creatinine  3.07  ECG (personally reviewed): NSR, QTc 482 msec  PMH: 1. Gout 2. HTN: Long-standing, poor control. 3. CKD: Stage 3-4, followed by Dr. [name].  Likely hypertensive nephropathy.  4. Dilated cardiomyopathy: May be due to long-standing HTN.  - Echo (1/18): EF 20-25%, severe MR - Echo (2/18): EF 25%, severe MR.  - TEE (5/18): Moderate LV dilation with severe global hypokinesis, EF 25-30%, normal RV size with mildly decreased systolic function, RV-RA gradient 55 mmHg, severe MR with mildly thickened leaflets with inadequate coaptation and ERO 0.52 cm^2 by PISA and systolic flow reversal in the pulmonary vein doppler pattern => secondary MR from dilated LV but thickened leaflets lead to concern for component of primary MR as well.  - RHC/LHC (6/18): No significant CAD.  Mean RA 15, PA 76/31 mean 48, mean PCWP 45, CI 3.02 Fick, 2.64 thermo.  - Echo (7/18): EF 25-30%, mild dilation, diffuse hypokinesis, s/p MV repair with mean gradient 4 mmHg and mild MR, PASP 37 mmHg, normal RV size and systolic function.  - Echo (12/18): EF 30-35%, moderate LV dilation with diffuse hypokinesis, stable MV repair.  - Echo (4/19): EF 40-45%, severe LV dilation, s/p mitral valve repair with mean gradient 4 mmHg, no mitral regurgitation, severe RV dilation with normal systolic function.  5. Severe MR: See TEE report above.  - S/p MV repair in 6/18.  SH: Married, lives in [place], nonsmoker, no drugs, occasional ETOH.  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   Family History  Problem Relation Age of Onset  . Diabetes Mother   . Hypertension Mother   . Heart failure Mother    ROS: All systems reviewed and negative except as per HPI.   Current Outpatient Medications  Medication Sig Dispense Refill  . amLODipine (NORVASC) 10 MG tablet Take 1 tablet (10 mg total) by mouth daily. 30 tablet 3  . aspirin EC 81 MG tablet Take 1 tablet (81 mg total) by mouth daily. 90 tablet 3  . carvedilol  (COREG) 25 MG tablet TAKE 1 TABLET BY MOUTH TWICE DAILY 60 tablet 3  . cholecalciferol (VITAMIN D) 1000 units tablet Take 1,000 Units by mouth daily.    . FeAspGl-FeFum-B12-FA-C-Succ Ac (FERREX 28 PO) Take 1 tablet by mouth daily.     . Febuxostat (ULORIC) 80 MG TABS Take 80 mg by mouth daily.     . furosemide (LASIX) 40 MG tablet Take 40 mg (1 Tablet) in the AM and 20 mg (0.5 Tablet) in the PM. 60 tablet 3  . hydrALAZINE (APRESOLINE) 100 MG tablet Take 1 tablet (100 mg total) by mouth 3 (three) times daily. 270 tablet 3  . isosorbide mononitrate (IMDUR) 60 MG 24 hr tablet Take 1 tablet (60 mg total) by mouth daily. 30 tablet 3  . Multiple Vitamin (MULTIVITAMIN WITH MINERALS) TABS tablet Take 1 tablet by mouth 2 (two) times daily.     . potassium chloride SA (K-DUR,KLOR-CON) 20 MEQ tablet Take 2 tablets (40 mEq total) by mouth daily. 60 tablet 3  . albuterol (PROVENTIL HFA;VENTOLIN HFA) 108 (90 Base) MCG/ACT inhaler Inhale 2 puffs into the lungs every 6 (six) hours as needed for wheezing or shortness of breath. 1 Inhaler 2   No current facility-administered medications for this encounter.    BP 118/80   Pulse 81   Wt 218 lb 6.4 oz (99.1 kg)   SpO2 98%   BMI 32.24 kg/m  General: NAD Neck: No JVD, no thyromegaly or thyroid nodule.  Lungs: Clear to auscultation bilaterally with normal respiratory effort. CV: Nondisplaced PMI.  Heart regular S1/S2, no S3/S4, 1/6 HSM LLSB.  1+ ankle edema.  No carotid bruit.  Normal pedal pulses.  Abdomen: Soft, nontender, no hepatosplenomegaly, no distention.  Skin: Intact without lesions or rashes.  Neurologic: Alert and oriented x 3.  Psych: Normal affect. Extremities: No clubbing or cyanosis.  HEENT: Normal.   Assessment/Plan: 1. HTN: Possible hypertensive cardiomyopathy and hypertensive nephropathy.  BP now controlled.  2. CKD: Stage 4.  Followed by nephrology. BMET today.   3. Chronic systolic CHF: Echo in 7/78 with EF 25-30%. Nonischemic  cardiomyopathy based on cath in 6/18.  Cause of cardiomyopathy may be long-standing, poorly controlled HTN.  NYHA class II symptoms.  He is improved symptomatically s/p MV repair.  Last echo in 4/19 with EF 40-45%, improved. Weight is down, he is not volume overloaded on exam.  - Continue Lasix 40 qam/20 qpm.  BMET today.   - Continue hydralazine 100 mg tid and Imdur 60 mg daily.   - Continue Coreg 25 mg bid.  - Holding off on ARB/ACEI/ARNI with elevated creatinine.  - Spironolactone was stopped with elevated creatinine.  - EF is now out of ICD range.  - He was warned to avoid high sodium foods while on his [place] vacation.  4. Mitral regurgitation: s/p MV repair for severe MR.  Much improved s/p surgery. Stable MV repair on 4/19  echo.  - Will need abx with dental work given prosthetic material.  5. Asthma: Occasional wheezing, not exertion.  I will give him an albuterol inhaler to use prn.   Followup in 4 months.    Loralie Champagne 06/11/2017

## 2017-06-13 ENCOUNTER — Other Ambulatory Visit (HOSPITAL_COMMUNITY): Payer: Self-pay | Admitting: Cardiology

## 2017-06-18 ENCOUNTER — Telehealth (HOSPITAL_COMMUNITY): Payer: Self-pay

## 2017-06-18 ENCOUNTER — Encounter (HOSPITAL_COMMUNITY): Payer: Self-pay

## 2017-06-18 NOTE — Telephone Encounter (Signed)
Notes recorded by Shirley Muscat, RN on 06/18/2017 at 11:00 AM EDT I have been unable to reach this patient by phone. A letter is being sent.  ------  Notes recorded by Harvie Junior, CMA on 06/14/2017 at 2:52 PM EDT Left VM for pt to call for lab results. ------  Notes recorded by Harvie Junior, CMA on 06/13/2017 at 1:17 PM EDT Left VM for pt to call back for lab results. ------  Notes recorded by Larey Dresser, MD on 06/11/2017 at 9:48 PM EDT Stable creatinine at 3.1. Needs to see nephrology.

## 2017-06-21 ENCOUNTER — Ambulatory Visit: Payer: Medicaid Other | Admitting: Podiatry

## 2017-06-21 ENCOUNTER — Encounter: Payer: Self-pay | Admitting: Podiatry

## 2017-06-21 VITALS — BP 127/79 | HR 68

## 2017-06-21 DIAGNOSIS — B351 Tinea unguium: Secondary | ICD-10-CM | POA: Diagnosis not present

## 2017-06-21 DIAGNOSIS — M79609 Pain in unspecified limb: Principal | ICD-10-CM

## 2017-06-21 DIAGNOSIS — M79676 Pain in unspecified toe(s): Secondary | ICD-10-CM | POA: Diagnosis not present

## 2017-06-21 NOTE — Patient Instructions (Signed)

## 2017-06-24 NOTE — Progress Notes (Signed)
  Subjective:  Patient ID: Vernon Blackburn, male    DOB: 19-Aug-1965,  MRN: [id number]  Chief Complaint  Patient presents with  . Toe Pain    right great toe is painful since 6 mos.,no treatments have been done   52 y.o. male returns for the above complaint.  Reports painful ingrown nails to both feet that he cannot care for himself.  Denies prior treatments.  Objective:   Vitals:   06/21/17 1323  BP: 127/79  Pulse: 68   General AA&O x3. Normal mood and affect.  Vascular Pedal pulses palpable.  Neurologic Epicritic sensation grossly intact.  Dermatologic No open lesions. Skin normal texture and turgor. Toenails x 10 elongated, thickened, dystrophic, ingrowing  Orthopedic: Pain to palpation about the toenails.   Assessment & Plan:  Patient was evaluated and treated and all questions answered.  Onychomycosis with pain  -Nails palliatively debrided as below. -Educated on self-care  Procedure: Nail Debridement Rationale: pain  Type of Debridement: manual, sharp debridement. Instrumentation: Nail nipper, rotary burr. Number of Nails: 10     Return if symptoms worsen or fail to improve.

## 2017-06-25 ENCOUNTER — Encounter: Payer: Medicaid Other | Admitting: Thoracic Surgery (Cardiothoracic Vascular Surgery)

## 2017-06-27 ENCOUNTER — Telehealth (HOSPITAL_COMMUNITY): Payer: Self-pay | Admitting: Cardiology

## 2017-06-27 DIAGNOSIS — I5022 Chronic systolic (congestive) heart failure: Secondary | ICD-10-CM

## 2017-06-27 NOTE — Telephone Encounter (Signed)
Abnormal labs received Labs drawn 6/22 K 3.3 Cr 3.16 BUN 35  Per Caryl Pina Smith,NP Patient should increase potassium to 40/20 if patent is taking 40 daily and recheck labs x 1 week  Patient aware of lab results Reports he is currently taking 20 meq daily Advised to increase to 40 meq and recheck labs x 1 week (7/3)

## 2017-06-29 ENCOUNTER — Other Ambulatory Visit (HOSPITAL_COMMUNITY): Payer: Self-pay | Admitting: Cardiology

## 2017-07-02 ENCOUNTER — Encounter: Payer: Medicaid Other | Admitting: Thoracic Surgery (Cardiothoracic Vascular Surgery)

## 2017-07-03 ENCOUNTER — Encounter: Payer: Self-pay | Admitting: Thoracic Surgery (Cardiothoracic Vascular Surgery)

## 2017-07-03 ENCOUNTER — Other Ambulatory Visit: Payer: Self-pay

## 2017-07-03 ENCOUNTER — Ambulatory Visit: Payer: Medicaid Other | Admitting: Thoracic Surgery (Cardiothoracic Vascular Surgery)

## 2017-07-03 VITALS — BP 114/77 | HR 84 | Resp 16 | Ht 70.0 in | Wt 210.4 lb

## 2017-07-03 DIAGNOSIS — I341 Nonrheumatic mitral (valve) prolapse: Secondary | ICD-10-CM | POA: Diagnosis not present

## 2017-07-03 DIAGNOSIS — I340 Nonrheumatic mitral (valve) insufficiency: Secondary | ICD-10-CM

## 2017-07-03 DIAGNOSIS — Z9889 Other specified postprocedural states: Secondary | ICD-10-CM

## 2017-07-03 NOTE — Patient Instructions (Signed)

## 2017-07-03 NOTE — Progress Notes (Signed)
[address]       [place] [address]             [phone]     CARDIOTHORACIC SURGERY OFFICE NOTE  Referring Provider is Larey Dresser, MD Primary Cardiologist is Sueanne Margarita, MD PCP is [name], MD   HPI:  Patient is a 52 year old African-American male with history of nonrheumatic mitral regurgitation, long-standing hypertensive heart disease with chronic combined systolic and diastolic congestive heart failure, chronic kidney disease, and obstructive sleep apnea who returns to the office for routine follow-up today status post minimally invasive mitral valve repair on 06/29/2016 for severe symptomatic secondary mitral regurgitation refractory to medical therapy.  He was last seen in our office on September 25, 2016 at which time he was doing well.  Since then he has continued to do remarkably well.  He has been followed carefully by [name] and underwent routine follow-up echocardiogram on 04/13/2017.  Echocardiogram revealed some further improvement in left ventricular ejection fraction, estimated 40 to 45%.  The mitral valve repair remained intact with no residual mitral regurgitation.  Mean transvalvular gradient was estimated 4 mmHg.  He returns to our office today and reports that he is doing exceptionally well.  His exercise tolerance has continued to improve and now he currently runs on a regular basis.  He states that he cannot run terribly fast, but he is exercising and doing exceptionally well.  Overall he feels dramatically improved in comparison with how he felt last year prior to surgery.  He got married 6 weeks ago and his job is going quite well.   Current Outpatient Medications  Medication Sig Dispense Refill  . albuterol (PROVENTIL HFA;VENTOLIN HFA) 108 (90 Base) MCG/ACT inhaler Inhale 2 puffs into the lungs every 6 (six) hours as needed for wheezing or shortness of breath. 1 Inhaler 2  . amLODipine (NORVASC) 10 MG tablet Take 1  tablet (10 mg total) by mouth daily. 30 tablet 3  . aspirin EC 81 MG tablet Take 1 tablet (81 mg total) by mouth daily. 90 tablet 3  . cholecalciferol (VITAMIN D) 1000 units tablet Take 1,000 Units by mouth daily.    . FeAspGl-FeFum-B12-FA-C-Succ Ac (FERREX 28 PO) Take 1 tablet by mouth daily.     . Febuxostat (ULORIC) 80 MG TABS Take 80 mg by mouth daily.     . furosemide (LASIX) 40 MG tablet Take 40 mg (1 Tablet) in the AM and 20 mg (0.5 Tablet) in the PM. 60 tablet 3  . hydrALAZINE (APRESOLINE) 100 MG tablet Take 1 tablet (100 mg total) by mouth 3 (three) times daily. 270 tablet 3  . isosorbide mononitrate (IMDUR) 60 MG 24 hr tablet Take 1 tablet (60 mg total) by mouth daily. 30 tablet 3  . Multiple Vitamin (MULTIVITAMIN WITH MINERALS) TABS tablet Take 1 tablet by mouth 2 (two) times daily.     . potassium chloride SA (K-DUR,KLOR-CON) 20 MEQ tablet Take 2 tablets (40 mEq total) by mouth daily. 60 tablet 3  . VYVANSE 60 MG capsule Take by mouth daily.  0  . carvedilol (COREG) 25 MG tablet TAKE 1 TABLET BY MOUTH TWICE DAILY 60 tablet 3   No current facility-administered medications for this visit.       Physical Exam:   BP 114/77 (BP Location: Right Arm, Patient Position: Sitting, Cuff Size: Large)   Pulse 84   Resp 16   Ht 5\' 10"  (1.778 m)  Wt 210 lb 6.4 oz (95.4 kg)   SpO2 95% Comment: ON RA  BMI 30.19 kg/m   General:  Well-appearing  Chest:   Clear to auscultation  CV:   Regular rate and rhythm without murmur  Incisions:  Completely healed  Abdomen:  Soft nontender  Extremities:  Warm and well-perfused  Diagnostic Tests:  Transthoracic Echocardiography  Patient:    Vernon Blackburn, Vernon Blackburn MR #:       [id number] Study Date: 04/13/2017 Gender:     M Age:        51 Height:     175.3 cm Weight:     96.8 kg BSA:        2.2 m^2 Pt. Status: Room:   ATTENDING    Loralie Champagne, M.D.  ORDERING     Loralie Champagne, M.D.  REFERRING    Loralie Champagne, M.D.  SONOGRAPHER  Wyatt Mage, RDCS  PERFORMING   [facility], Outpatient  cc:  ------------------------------------------------------------------- LV EF: 40% -   45%  ------------------------------------------------------------------- Indications:      CHF (I50.22).  ------------------------------------------------------------------- History:   PMH:   Syncope and dyspnea.  Congestive heart failure. Cardiomyopathy.  Risk factors:  CKD stage 4. Pulmonary hypertension. Pulmonary embolus. Anemia. Edema. Hypertension.  ------------------------------------------------------------------- Study Conclusions  - Left ventricle: The cavity size was severely dilated. Systolic   function was mildly to moderately reduced. The estimated ejection   fraction was in the range of 40% to 45%. Wall motion was normal;   there were no regional wall motion abnormalities. Features are   consistent with a pseudonormal left ventricular filling pattern,   with concomitant abnormal relaxation and increased filling   pressure (grade 2 diastolic dysfunction). Doppler parameters are   consistent with high ventricular filling pressure. - Aortic valve: Trileaflet; mildly thickened, mildly calcified   leaflets. - Mitral valve: S/P MV repair and annuloplasty ring. The findings   are consistent with mild stenosis. Mean gradient (D): 4 mm Hg.   Valve area by pressure half-time: 1.54 cm^2. - Left atrium: The atrium was mildly dilated. - Right ventricle: The cavity size was severely dilated. Wall   thickness was normal.  ------------------------------------------------------------------- Labs, prior tests, procedures, and surgery: Transthoracic echocardiography (12/19/2016).     EF was 30% and PA pressure was 33 (systolic). Mitral valve: peak gradient of 8 mm Hg and mean gradient of 9 mm Hg.  Valve surgery.     Mitral valve repair.  ------------------------------------------------------------------- Study data:  Comparison was made  to the study of 12/19/2016.  Study status:  Routine.  Procedure:  The patient reported no pain pre or post test. Transthoracic echocardiography. Image quality was adequate.  Study completion:  There were no complications. Transthoracic echocardiography.  M-mode, complete 2D, spectral Doppler, and color Doppler.  Birthdate:  Patient birthdate: 08-Jan-1965.  Age:  Patient is 52 yr old.  Sex:  Gender: male. BMI: 31.5 kg/m^2.  Blood pressure:     146/99  Patient status: Outpatient.  Study date:  Study date: 04/13/2017. Study time: 07:22 AM.  Location:  [facility]  -------------------------------------------------------------------  ------------------------------------------------------------------- Left ventricle:  The cavity size was severely dilated. Systolic function was mildly to moderately reduced. The estimated ejection fraction was in the range of 40% to 45%. Wall motion was normal; there were no regional wall motion abnormalities. Features are consistent with a pseudonormal left ventricular filling pattern, with concomitant abnormal relaxation and increased filling pressure (grade 2 diastolic dysfunction). Doppler parameters are consistent with high ventricular filling  pressure.  ------------------------------------------------------------------- Aortic valve:   Trileaflet; mildly thickened, mildly calcified leaflets. Mobility was not restricted.  Doppler:  Transvalvular velocity was within the normal range. There was no stenosis. There was no regurgitation.  ------------------------------------------------------------------- Aorta:  Aortic root: The aortic root was normal in size.  ------------------------------------------------------------------- Mitral valve:  S/P MV repair and annuloplasty ring.  Structurally normal valve.   Mobility was not restricted.  Doppler:   The findings are consistent with mild stenosis.   There was no regurgitation.    Valve area by  pressure half-time: 1.54 cm^2. Indexed valve area by pressure half-time: 0.7 cm^2/m^2. Valve area by continuity equation (using LVOT flow): 2.31 cm^2. Indexed valve area by continuity equation (using LVOT flow): 1.05 cm^2/m^2. Mean gradient (D): 4 mm Hg. Peak gradient (D): 5 mm Hg.  ------------------------------------------------------------------- Left atrium:  The atrium was mildly dilated.  ------------------------------------------------------------------- Right ventricle:  The cavity size was severely dilated. Wall thickness was normal. Systolic function was normal.  ------------------------------------------------------------------- Pulmonic valve:    Structurally normal valve.   Cusp separation was normal.  Doppler:  Transvalvular velocity was within the normal range. There was no evidence for stenosis. There was no regurgitation.  ------------------------------------------------------------------- Tricuspid valve:   Structurally normal valve.    Doppler: Transvalvular velocity was within the normal range. There was no regurgitation.  ------------------------------------------------------------------- Pulmonary artery:   The main pulmonary artery was normal-sized. Systolic pressure was within the normal range.  ------------------------------------------------------------------- Right atrium:  The atrium was normal in size.  ------------------------------------------------------------------- Pericardium:  There was no pericardial effusion.  ------------------------------------------------------------------- Systemic veins: Inferior vena cava: The vessel was normal in size.  ------------------------------------------------------------------- Measurements   Left ventricle                            Value          Reference  LV ID, ED, PLAX chordal           (H)     63    mm       43 - 52  LV ID, ES, PLAX chordal           (H)     45    mm       23 - 38  LV  fx shortening, PLAX chordal            29    %        >=29  LV PW thickness, ED                       10    mm       ---------  IVS/LV PW ratio, ED                       1              <=1.3  Stroke volume, 2D                         108   ml       ---------  Stroke volume/bsa, 2D                     49    ml/m^2   ---------  LV e&', lateral  6.32  cm/s     ---------  LV E/e&', lateral                          18.51          ---------  LV e&', medial                             4.19  cm/s     ---------  LV E/e&', medial                           27.92          ---------  LV e&', average                            5.26  cm/s     ---------  LV E/e&', average                          22.26          ---------    Ventricular septum                        Value          Reference  IVS thickness, ED                         10    mm       ---------    LVOT                                      Value          Reference  LVOT ID, S                                28    mm       ---------  LVOT area                                 6.16  cm^2     ---------  LVOT peak velocity, S                     99.4  cm/s     ---------  LVOT mean velocity, S                     68    cm/s     ---------  LVOT VTI, S                               17.6  cm       ---------    Aorta                                     Value          Reference  Aortic root ID, ED  38    mm       ---------  Ascending aorta ID, A-P, S                36    mm       ---------    Left atrium                               Value          Reference  LA ID, A-P, ES                            47    mm       ---------  LA ID/bsa, A-P                            2.14  cm/m^2   <=2.2  LA volume, S                              81.1  ml       ---------  LA volume/bsa, S                          36.9  ml/m^2   ---------  LA volume, ES, 1-p A4C                    97.6  ml       ---------  LA volume/bsa,  ES, 1-p A4C                44.4  ml/m^2   ---------  LA volume, ES, 1-p A2C                    66.3  ml       ---------  LA volume/bsa, ES, 1-p A2C                30.1  ml/m^2   ---------    Mitral valve                              Value          Reference  Mitral E-wave peak velocity               117   cm/s     ---------  Mitral A-wave peak velocity               146   cm/s     ---------  Mitral mean velocity, D                   95.5  cm/s     ---------  Mitral deceleration time          (H)     412   ms       150 - 230  Mitral pressure half-time                 164   ms       ---------  Mitral mean gradient, D                   4     mm Hg    ---------  Mitral peak gradient, D                   5     mm Hg    ---------  Mitral E/A ratio, peak                    0.8            ---------  Mitral valve area, PHT, DP                1.54  cm^2     ---------  Mitral valve area/bsa, PHT, DP            0.7   cm^2/m^2 ---------  Mitral valve area, LVOT                   2.31  cm^2     ---------  continuity  Mitral valve area/bsa, LVOT               1.05  cm^2/m^2 ---------  continuity  Mitral annulus VTI, D                     46.9  cm       ---------    Systemic veins                            Value          Reference  Estimated CVP                             3     mm Hg    ---------    Right ventricle                           Value          Reference  TAPSE                                     23.9  mm       ---------  RV s&', lateral, S                         11    cm/s     ---------  Legend: (L)  and  (H)  mark values outside specified reference range.  ------------------------------------------------------------------- Prepared and Electronically Authenticated by  Fransico Him, MD 2019-04-12T22:05:23   Impression:  Patient is doing remarkably well approximately 1 year status post minimally invasive mitral valve repair for severe symptomatic secondary mitral  regurgitation caused by nonischemic cardiomyopathy.  Plan:  We have not recommended any change the patient's current medications.  Patient will continue to follow-up with [name] and return to our office in the future only should specific problems or questions arise.  The patient has been reminded regarding the importance of dental hygiene and the lifelong need for antibiotic prophylaxis for all dental cleanings and other related invasive procedures.   I spent in excess of 15 minutes during the conduct of this office consultation and >50% of this time involved direct face-to-face encounter with the patient for counseling and/or coordination of their care.    [name], MD 07/03/2017 1:27 PM

## 2017-07-04 ENCOUNTER — Ambulatory Visit (HOSPITAL_COMMUNITY)
Admission: RE | Admit: 2017-07-04 | Discharge: 2017-07-04 | Disposition: A | Discharge status: Home / Self Care | Payer: Medicaid Other | Source: Ambulatory Visit | Attending: Internal Medicine | Admitting: Internal Medicine

## 2017-07-04 DIAGNOSIS — I5022 Chronic systolic (congestive) heart failure: Secondary | ICD-10-CM | POA: Insufficient documentation

## 2017-07-04 LAB — BASIC METABOLIC PANEL
Anion gap: 9 (ref 5–15)
BUN: 40 mg/dL — ABNORMAL HIGH (ref 6–20)
CO2: 21 mmol/L — ABNORMAL LOW (ref 22–32)
Calcium: 8.6 mg/dL — ABNORMAL LOW (ref 8.9–10.3)
Chloride: 115 mmol/L — ABNORMAL HIGH (ref 98–111)
Creatinine, Ser: 3.34 mg/dL — ABNORMAL HIGH (ref 0.61–1.24)
GFR calc Af Amer: 23 mL/min — ABNORMAL LOW (ref >60)
GFR calc non Af Amer: 20 mL/min — ABNORMAL LOW (ref >60)
Glucose, Bld: 87 mg/dL (ref 70–99)
Potassium: 3.4 mmol/L — ABNORMAL LOW (ref 3.5–5.1)
Sodium: 145 mmol/L (ref 135–145)

## 2017-07-15 ENCOUNTER — Other Ambulatory Visit (HOSPITAL_COMMUNITY): Payer: Self-pay | Admitting: Cardiology

## 2017-07-26 ENCOUNTER — Other Ambulatory Visit (HOSPITAL_COMMUNITY): Payer: Self-pay | Admitting: Cardiology

## 2017-07-30 ENCOUNTER — Encounter (HOSPITAL_COMMUNITY): Payer: Self-pay

## 2017-07-30 NOTE — Progress Notes (Signed)
[facility] medical Record Request completed for records 01/02/17 - present. 19 pages faxed to provided fax # [phone] Copy of request scanned into patient's electronic medical record.  Renee Pain, RN

## 2017-09-04 ENCOUNTER — Ambulatory Visit: Payer: Medicaid Other | Admitting: Cardiology

## 2017-09-04 NOTE — Progress Notes (Deleted)
Cardiology Office Note:    Date:  09/04/2017   ID:  Vernon Blackburn, DOB 1965-01-17, MRN [id number]  PCP:  Vernon Dress, MD  Cardiologist:  No primary care provider on file.  Referring MD: Vernon Dress, MD   No chief complaint on file. ***  History of Present Illness:    Vernon Blackburn is a 52 y.o. male with a past medical history significant for  long-standing, poorly-controlled HTN, CKD stage 3-4, chronic systolic CHF and severe mitral regurgitation.  Right and left heart cath in 06/21/2016 showed no significant CAD.  He underwent mitral valve repair in 06/2016.  Echo 12/18 with EF 30-35%, moderate LV dilation, s/p MVR repair with trivial MR.  Echo in 4/19 showed EF up to 40-45% with stable repaired mitral valve.   He is seen in the advanced heart failure clinic by Vernon. Aundra Blackburn, last seen on 06/11/17. His creatinine has been running higher and spironolactone discontinued.    Past Medical History:  Diagnosis Date  . Anemia, chronic disease   . Arthritis    Gout  . Asthma   . Cardiomyopathy ([place])   . CHF (congestive heart failure) ([place])    a. 01/2016: echo showing EF of 20-25%, no WMA, severe MR, and PA Peak Pressure of 52 mm Hg.   Marland Kitchen Chronic systolic HF (heart failure) ([place])   . CKD (chronic kidney disease)    Vernon Blackburn  . Elbow pain, left   . Heart murmur   . History of kidney stones   . Knee pain, bilateral   . Left ankle pain 11/04/2010   Gout  . Leg pain, bilateral   . Peripheral edema    Bilateral lower extremity   . Pneumonia    hx  . Pulmonary HTN ([place])   . S/P minimally invasive mitral valve repair 06/29/2016   30 mm [name] via right mini thoracotomy approach  . Severe mitral regurgitation   . Sleep apnea    hx bariatric  surgery 3 yrs ago lost 166 lbs  . Syncope and collapse 07/14/2016  . Venous insufficiency     Past Surgical History:  Procedure Laterality Date  . GASTRIC BYPASS  2014  . MENISCUS REPAIR Right 05/2008   right   knee  . MITRAL VALVE REPAIR Right 06/29/2016   Procedure: MINIMALLY INVASIVE MITRAL VALVE REPAIR  (MVR);  Surgeon: Vernon Alberts, MD;  Location: [facility];  Service: Open Heart Surgery;  Laterality: Right;  . RIGHT/LEFT HEART CATH AND CORONARY ANGIOGRAPHY N/A 06/07/2016   Procedure: Right/Left Heart Cath and Coronary Angiography;  Surgeon: Vernon Dresser, MD;  Location: [facility];  Service: Cardiovascular;  Laterality: N/A;  . TEE WITHOUT CARDIOVERSION N/A 05/04/2016   Procedure: TRANSESOPHAGEAL ECHOCARDIOGRAM (TEE);  Surgeon: Vernon Dresser, MD;  Location[facility] ENDOSCOPY;  Service: Cardiovascular;  Laterality: N/A;  . TEE WITHOUT CARDIOVERSION N/A 06/29/2016   Procedure: TRANSESOPHAGEAL ECHOCARDIOGRAM (TEE);  Surgeon: Vernon Alberts, MD;  Location: [facility];  Service: Open Heart Surgery;  Laterality: N/A;    Current Medications: No outpatient medications have been marked as taking for the 09/04/17 encounter (Appointment) with Vernon Perch, NP.     Allergies:   No known allergies   Social History   Socioeconomic History  . Marital status: Married    Spouse name: Not on file  . Number of children: Not on file  . Years of education: Not on file  . Highest education level: Not  on file  Occupational History  . Not on file  Social Needs  . Financial resource strain: Not on file  . Food insecurity:    Worry: Not on file    Inability: Not on file  . Transportation needs:    Medical: Not on file    Non-medical: Not on file  Tobacco Use  . Smoking status: Never Smoker  . Smokeless tobacco: Never Used  Substance and Sexual Activity  . Alcohol use: Yes    Comment: socially  . Drug use: No  . Sexual activity: Not on file  Lifestyle  . Physical activity:    Days per week: Not on file    Minutes per session: Not on file  . Stress: Not on file  Relationships  . Social connections:    Talks on phone: Not on file    Gets together: Not on file    Attends religious service: Not  on file    Active member of club or organization: Not on file    Attends meetings of clubs or organizations: Not on file    Relationship status: Not on file  Other Topics Concern  . Not on file  Social History Narrative  . Not on file     Family History: The patient's ***family history includes Diabetes in his mother; Heart failure in his mother; Hypertension in his mother. ROS:   Please see the history of present illness.    *** All other systems reviewed and are negative.  EKGs/Labs/Other Studies Reviewed:    The following studies were reviewed today:  - Echo (1/18): EF 20-25%, severe MR - Echo (2/18): EF 25%, severe MR.  - TEE (5/18): Moderate LV dilation with severe global hypokinesis, EF 25-30%, normal RV size with mildly decreased systolic function, RV-RA gradient 55 mmHg, severe MR with mildly thickened leaflets with inadequate coaptation and ERO 0.52 cm^2 by PISA and systolic flow reversal in the pulmonary vein doppler pattern => secondary MR from dilated LV but thickened leaflets lead to concern for component of primary MR as well.  - RHC/LHC (6/18): No significant CAD.  Mean RA 15, PA 76/31 mean 48, mean PCWP 45, CI 3.02 Fick, 2.64 thermo.  - Echo (7/18): EF 25-30%, mild dilation, diffuse hypokinesis, s/p MV repair with mean gradient 4 mmHg and mild MR, PASP 37 mmHg, normal RV size and systolic function.  - Echo (12/18): EF 30-35%, moderate LV dilation with diffuse hypokinesis, stable MV repair.  - Echo (4/19): EF 40-45%, severe LV dilation, s/p mitral valve repair with mean gradient 4 mmHg, no mitral regurgitation, severe RV dilation with normal systolic function.   EKG:  EKG is *** ordered today.  The ekg ordered today demonstrates ***  Recent Labs: 07/04/2017: BUN 40; Creatinine, Ser 3.34; Potassium 3.4; Sodium 145   Recent Lipid Panel No results found for: CHOL, TRIG, HDL, CHOLHDL, VLDL, LDLCALC, LDLDIRECT  Physical Exam:    VS:  There were no vitals taken for this  visit.    Wt Readings from Last 3 Encounters:  07/03/17 210 lb 6.4 oz (95.4 kg)  06/11/17 218 lb 6.4 oz (99.1 kg)  03/09/17 213 lb 8 oz (96.8 kg)     Physical Exam***   ASSESSMENT:    1. Hypertensive heart and chronic kidney disease with heart failure and stage 1 through stage 4 chronic kidney disease, or chronic kidney disease ([place])   2. Dilated cardiomyopathy ([place])   3. Chronic kidney disease (CKD), stage IV (severe) (HCC)  4. S/P minimally invasive mitral valve repair    PLAN:    In order of problems listed above:  Hypertension:  Dilated cardiomyopathy: Possibly related to long-standing hypertension.  EF was previously down to 20-25% in 01/22/2016 with severe MR.  Most recent echo in 04/2017 showed EF 40-45% at her mitral valve repair.  Treated with carvedilol 25 mg twice daily, Imdur and hydralazine, and Lasix.  No longer on ARB or spironolactone due to renal function.  CKD stage 3-4: Followed by Vernon. Justin Blackburn. Likely hypertensive nephropathy  S/P MV repair in 06/2016:  Medication Adjustments/Labs and Tests Ordered: Current medicines are reviewed at length with the patient today.  Concerns regarding medicines are outlined above. Labs and tests ordered and medication changes are outlined in the patient instructions below:  There are no Patient Instructions on file for this visit.   Signed, Vernon Perch, NP  09/04/2017 5:18 AM    [place]

## 2017-09-30 ENCOUNTER — Other Ambulatory Visit (HOSPITAL_COMMUNITY): Payer: Self-pay | Admitting: Cardiology

## 2017-10-01 ENCOUNTER — Other Ambulatory Visit (HOSPITAL_COMMUNITY): Payer: Self-pay | Admitting: *Deleted

## 2017-10-01 MED ORDER — FUROSEMIDE 40 MG PO TABS: ORAL_TABLET | ORAL | 3 refills | Status: DC

## 2017-11-07 ENCOUNTER — Other Ambulatory Visit (HOSPITAL_COMMUNITY): Payer: Self-pay | Admitting: Cardiology

## 2017-11-07 DIAGNOSIS — I10 Essential (primary) hypertension: Secondary | ICD-10-CM

## 2017-11-15 DIAGNOSIS — Z736 Limitation of activities due to disability: Secondary | ICD-10-CM

## 2017-11-22 ENCOUNTER — Encounter: Payer: Self-pay | Admitting: Gastroenterology

## 2017-11-26 ENCOUNTER — Telehealth: Payer: Self-pay | Admitting: *Deleted

## 2017-11-26 NOTE — Telephone Encounter (Signed)
John,  Will you please review this pt's hx?  He has an extensive cardiac hx.  Thanks, [employer]

## 2017-11-27 NOTE — Telephone Encounter (Signed)
Kristen,  This pt is cleared for anesthetic care at [facility].  Thanks,  Kaylenn Civil 

## 2017-11-27 NOTE — Telephone Encounter (Signed)
noted 

## 2017-12-05 ENCOUNTER — Telehealth: Payer: Self-pay | Admitting: *Deleted

## 2017-12-05 NOTE — Telephone Encounter (Signed)
Patient no showed appointment at 230 pm today. Patient requested to reschedule tomorrow at 330. Patient scheduled in room 51.

## 2017-12-06 ENCOUNTER — Ambulatory Visit (AMBULATORY_SURGERY_CENTER): Payer: Self-pay

## 2017-12-06 ENCOUNTER — Other Ambulatory Visit (HOSPITAL_COMMUNITY): Payer: Self-pay | Admitting: Cardiology

## 2017-12-06 VITALS — Ht 69.0 in | Wt 229.4 lb

## 2017-12-06 DIAGNOSIS — Z1211 Encounter for screening for malignant neoplasm of colon: Secondary | ICD-10-CM

## 2017-12-06 MED ORDER — NA SULFATE-K SULFATE-MG SULF 17.5-3.13-1.6 GM/177ML PO SOLN: 1.0000 | Freq: Once | ORAL | 0 refills | Status: AC

## 2017-12-06 NOTE — Progress Notes (Signed)
Denies allergies to eggs or soy products. Denies complication of anesthesia or sedation. Denies use of weight loss medication. Denies use of O2.   Emmi instructions declined.  

## 2017-12-17 ENCOUNTER — Ambulatory Visit (AMBULATORY_SURGERY_CENTER): Payer: Medicaid Other | Admitting: Gastroenterology

## 2017-12-17 ENCOUNTER — Encounter: Payer: Self-pay | Admitting: Gastroenterology

## 2017-12-17 VITALS — BP 115/80 | HR 73 | Temp 97.1°F | Resp 17 | Ht 70.0 in | Wt 210.0 lb

## 2017-12-17 DIAGNOSIS — K640 First degree hemorrhoids: Secondary | ICD-10-CM

## 2017-12-17 DIAGNOSIS — Z1211 Encounter for screening for malignant neoplasm of colon: Secondary | ICD-10-CM

## 2017-12-17 MED ORDER — SODIUM CHLORIDE 0.9 % IV SOLN: 500.0000 mL | Freq: Once | INTRAVENOUS | Status: DC

## 2017-12-17 NOTE — Op Note (Signed)
[facility] Patient Name: Vernon Blackburn Procedure Date: 12/17/2017 11:48 AM MRN: [id number] Endoscopist: Gerrit Heck , MD Age: 52 Referring MD:  Date of Birth: 1965-01-24 Gender: Male Account #: [id number] Procedure:                Colonoscopy Indications:              Screening for colorectal malignant neoplasm (last                            colonoscopy was 10 years ago) Medicines:                Monitored Anesthesia Care Procedure:                Pre-Anesthesia Assessment:                           - Prior to the procedure, a History and Physical                            was performed, and patient medications and                            allergies were reviewed. The patient's tolerance of                            previous anesthesia was also reviewed. The risks                            and benefits of the procedure and the sedation                            options and risks were discussed with the patient.                            All questions were answered, and informed consent                            was obtained. Prior Anticoagulants: The patient has                            taken aspirin, last dose was 1 day prior to                            procedure. ASA Grade Assessment: III - A patient                            with severe systemic disease. After reviewing the                            risks and benefits, the patient was deemed in                            satisfactory condition to undergo the procedure.  After obtaining informed consent, the colonoscope                            was passed under direct vision. Throughout the                            procedure, the patient's blood pressure, pulse, and                            oxygen saturations were monitored continuously. The                            Colonoscope was introduced through the anus and                            advanced to the the terminal  ileum. The colonoscopy                            was performed without difficulty. The patient                            tolerated the procedure well. The quality of the                            bowel preparation was fair. Scope In: 11:59:32 AM Scope Out: 12:13:13 PM Scope Withdrawal Time: 0 hours 9 minutes 48 seconds  Total Procedure Duration: 0 hours 13 minutes 41 seconds  Findings:                 The perianal and digital rectal examinations were                            normal.                           A moderate amount of semi-liquid semi-solid stool                            was found in the entire colon, interfering with                            visualization. Lavage of the area was performed                            using copious amounts of sterile water, resulting                            in clearance with fair visualization.                           The exam was otherwise normal throughout the                            examined colon.  Non-bleeding internal hemorrhoids were found during                            retroflexion. The hemorrhoids were small.                           The terminal ileum appeared normal. Complications:            No immediate complications. Estimated Blood Loss:     Estimated blood loss: none. Impression:               - Preparation of the colon was fair.                           - Stool in the entire examined colon. Cannot rule                            out the presence of small or flat polyps in these                            areas, and therefore this examination is consider                            incomplete from a colon cancer screening standpoint.                           - Non-bleeding internal hemorrhoids.                           - The examined portion of the ileum was normal.                           - No specimens collected. Recommendation:           - Patient has a contact number  available for                            emergencies. The signs and symptoms of potential                            delayed complications were discussed with the                            patient. Return to normal activities tomorrow.                            Written discharge instructions were provided to the                            patient.                           - Resume previous diet today.                           - Continue present medications.                           -  Repeat colonoscopy in 1-2 years because the bowel                            preparation was suboptimal.                           - Return to GI office PRN. Gerrit Heck, MD 12/17/2017 12:18:19 PM

## 2017-12-17 NOTE — Patient Instructions (Signed)
YOU HAD AN ENDOSCOPIC PROCEDURE TODAY AT [facility]:   Refer to the procedure report that was given to you for any specific questions about what was found during the examination.  If the procedure report does not answer your questions, please call your gastroenterologist to clarify.  If you requested that your care partner not be given the details of your procedure findings, then the procedure report has been included in a sealed envelope for you to review at your convenience later.  YOU SHOULD EXPECT: Some feelings of bloating in the abdomen. Passage of more gas than usual.  Walking can help get rid of the air that was put into your GI tract during the procedure and reduce the bloating. If you had a lower endoscopy (such as a colonoscopy or flexible sigmoidoscopy) you may notice spotting of blood in your stool or on the toilet paper. If you underwent a bowel prep for your procedure, you may not have a normal bowel movement for a few days.  Please Note:  You might notice some irritation and congestion in your nose or some drainage.  This is from the oxygen used during your procedure.  There is no need for concern and it should clear up in a day or so.  SYMPTOMS TO REPORT IMMEDIATELY:   Following lower endoscopy (colonoscopy or flexible sigmoidoscopy):  Excessive amounts of blood in the stool  Significant tenderness or worsening of abdominal pains  Swelling of the abdomen that is new, acute  Fever of 100F or higher  For urgent or emergent issues, a gastroenterologist can be reached at any hour by calling [phone].   DIET:  We do recommend a small meal at first, but then you may proceed to your regular diet.  Drink plenty of fluids but you should avoid alcoholic beverages for 24 hours.  MEDICATIONS: Continue present medications.  Please see handouts given to you by your recovery nurse.  FOLLOW UP: Repeat colonoscopy in 1-2 years because bowel prep was suboptimal.  Otherwise, return to Dr. Vivia Ewing office as needed.  ACTIVITY:  You should plan to take it easy for the rest of today and you should NOT DRIVE or use heavy machinery until tomorrow (because of the sedation medicines used during the test).    FOLLOW UP: Our staff will call the number listed on your records the next business day following your procedure to check on you and address any questions or concerns that you may have regarding the information given to you following your procedure. If we do not reach you, we will leave a message.  However, if you are feeling well and you are not experiencing any problems, there is no need to return our call.  We will assume that you have returned to your regular daily activities without incident.  If any biopsies were taken you will be contacted by phone or by letter within the next 1-3 weeks.  Please call us at [phone] if you have not heard about the biopsies in 3 weeks.   Thank you for allowing [place] to provide for your healthcare needs today.  SIGNATURES/CONFIDENTIALITY: You and/or your care partner have signed paperwork which will be entered into your electronic medical record.  These signatures attest to the fact that that the information above on your After Visit Summary has been reviewed and is understood.  Full responsibility of the confidentiality of this discharge information lies with you and/or your care-partner.

## 2017-12-17 NOTE — Progress Notes (Signed)
Pt's states no medical or surgical changes since previsit or office visit. 

## 2017-12-17 NOTE — Progress Notes (Signed)
PT taken to PACU. Monitors in place. VSS. Report given to RN. 

## 2017-12-18 ENCOUNTER — Telehealth: Payer: Self-pay

## 2017-12-18 NOTE — Telephone Encounter (Signed)
  Follow up Call-  Call back number 12/17/2017  Post procedure Call Back phone  # [phone]  Permission to leave phone message Yes  Some recent data might be hidden     Patient questions:  Do you have a fever, pain , or abdominal swelling? No. Pain Score  0 *  Have you tolerated food without any problems? Yes.    Have you been able to return to your normal activities? Yes.    Do you have any questions about your discharge instructions: Diet   No. Medications  No. Follow up visit  No.  Do you have questions or concerns about your Care? No.  Actions: * If pain score is 4 or above: No action needed, pain <4.

## 2018-02-14 ENCOUNTER — Emergency Department (HOSPITAL_COMMUNITY)
Admission: EM | Admit: 2018-02-14 | Discharge: 2018-02-14 | Disposition: A | Discharge status: Home / Self Care | Payer: Medicaid Other | Attending: Emergency Medicine | Admitting: Emergency Medicine

## 2018-02-14 ENCOUNTER — Other Ambulatory Visit: Payer: Self-pay

## 2018-02-14 ENCOUNTER — Emergency Department (HOSPITAL_COMMUNITY): Payer: Medicaid Other

## 2018-02-14 ENCOUNTER — Encounter (HOSPITAL_COMMUNITY): Payer: Self-pay | Admitting: Emergency Medicine

## 2018-02-14 DIAGNOSIS — Z79899 Other long term (current) drug therapy: Secondary | ICD-10-CM | POA: Insufficient documentation

## 2018-02-14 DIAGNOSIS — R072 Precordial pain: Secondary | ICD-10-CM

## 2018-02-14 DIAGNOSIS — R05 Cough: Secondary | ICD-10-CM | POA: Diagnosis not present

## 2018-02-14 DIAGNOSIS — J45909 Unspecified asthma, uncomplicated: Secondary | ICD-10-CM | POA: Insufficient documentation

## 2018-02-14 DIAGNOSIS — I130 Hypertensive heart and chronic kidney disease with heart failure and stage 1 through stage 4 chronic kidney disease, or unspecified chronic kidney disease: Secondary | ICD-10-CM | POA: Insufficient documentation

## 2018-02-14 DIAGNOSIS — Z7982 Long term (current) use of aspirin: Secondary | ICD-10-CM | POA: Diagnosis not present

## 2018-02-14 DIAGNOSIS — R5383 Other fatigue: Secondary | ICD-10-CM | POA: Diagnosis not present

## 2018-02-14 DIAGNOSIS — N184 Chronic kidney disease, stage 4 (severe): Secondary | ICD-10-CM | POA: Insufficient documentation

## 2018-02-14 DIAGNOSIS — R197 Diarrhea, unspecified: Secondary | ICD-10-CM | POA: Diagnosis not present

## 2018-02-14 DIAGNOSIS — R079 Chest pain, unspecified: Secondary | ICD-10-CM | POA: Diagnosis present

## 2018-02-14 DIAGNOSIS — R059 Cough, unspecified: Secondary | ICD-10-CM

## 2018-02-14 DIAGNOSIS — I5022 Chronic systolic (congestive) heart failure: Secondary | ICD-10-CM | POA: Insufficient documentation

## 2018-02-14 DIAGNOSIS — R61 Generalized hyperhidrosis: Secondary | ICD-10-CM | POA: Diagnosis not present

## 2018-02-14 DIAGNOSIS — R600 Localized edema: Secondary | ICD-10-CM | POA: Diagnosis not present

## 2018-02-14 LAB — HEPATIC FUNCTION PANEL
ALT: 17 U/L (ref 0–44)
AST: 19 U/L (ref 15–41)
Albumin: 3.5 g/dL (ref 3.5–5.0)
Alkaline Phosphatase: 59 U/L (ref 38–126)
Bilirubin, Direct: 0.1 mg/dL (ref 0.0–0.2)
Indirect Bilirubin: 0.6 mg/dL (ref 0.3–0.9)
Total Bilirubin: 0.7 mg/dL (ref 0.3–1.2)
Total Protein: 6.1 g/dL — ABNORMAL LOW (ref 6.5–8.1)

## 2018-02-14 LAB — URINALYSIS, ROUTINE W REFLEX MICROSCOPIC
Bilirubin Urine: NEGATIVE
Glucose, UA: NEGATIVE mg/dL
Ketones, ur: NEGATIVE mg/dL
Leukocytes,Ua: NEGATIVE
Nitrite: NEGATIVE
Protein, ur: 100 mg/dL — AB
Specific Gravity, Urine: 1.013 (ref 1.005–1.030)
pH: 5 (ref 5.0–8.0)

## 2018-02-14 LAB — BASIC METABOLIC PANEL
Anion gap: 7 (ref 5–15)
BUN: 37 mg/dL — ABNORMAL HIGH (ref 6–20)
CO2: 24 mmol/L (ref 22–32)
Calcium: 8.1 mg/dL — ABNORMAL LOW (ref 8.9–10.3)
Chloride: 111 mmol/L (ref 98–111)
Creatinine, Ser: 2.82 mg/dL — ABNORMAL HIGH (ref 0.61–1.24)
GFR calc Af Amer: 29 mL/min — ABNORMAL LOW (ref >60)
GFR calc non Af Amer: 25 mL/min — ABNORMAL LOW (ref >60)
Glucose, Bld: 93 mg/dL (ref 70–99)
Potassium: 3.1 mmol/L — ABNORMAL LOW (ref 3.5–5.1)
Sodium: 142 mmol/L (ref 135–145)

## 2018-02-14 LAB — I-STAT TROPONIN, ED
Troponin i, poc: 0.01 ng/mL (ref 0.00–0.08)
Troponin i, poc: 0.01 ng/mL (ref 0.00–0.08)

## 2018-02-14 LAB — CBC
HCT: 38.1 % — ABNORMAL LOW (ref 39.0–52.0)
Hemoglobin: 11.9 g/dL — ABNORMAL LOW (ref 13.0–17.0)
MCH: 28.2 pg (ref 26.0–34.0)
MCHC: 31.2 g/dL (ref 30.0–36.0)
MCV: 90.3 fL (ref 80.0–100.0)
Platelets: 249 10*3/uL (ref 150–400)
RBC: 4.22 MIL/uL (ref 4.22–5.81)
RDW: 14.6 % (ref 11.5–15.5)
WBC: 3.8 10*3/uL — ABNORMAL LOW (ref 4.0–10.5)
nRBC: 0 % (ref 0.0–0.2)

## 2018-02-14 LAB — BRAIN NATRIURETIC PEPTIDE: B Natriuretic Peptide: 107 pg/mL — ABNORMAL HIGH (ref 0.0–100.0)

## 2018-02-14 LAB — D-DIMER, QUANTITATIVE: D-Dimer, Quant: 0.5 ug/mL-FEU (ref 0.00–0.50)

## 2018-02-14 LAB — LIPASE, BLOOD: Lipase: 33 U/L (ref 11–51)

## 2018-02-14 MED ORDER — SODIUM CHLORIDE 0.9% FLUSH
3.0000 mL | Freq: Once | INTRAVENOUS | Status: AC
  Administered 2018-02-14: 3 mL via INTRAVENOUS

## 2018-02-14 MED ORDER — POTASSIUM CHLORIDE CRYS ER 20 MEQ PO TBCR
40.0000 meq | EXTENDED_RELEASE_TABLET | Freq: Once | ORAL | Status: AC
  Administered 2018-02-14: 40 meq via ORAL
  Filled 2018-02-14: qty 2

## 2018-02-14 NOTE — Discharge Instructions (Signed)
Your work-up today was overall reassuring.  Your troponin was negative twice which is reassuring from a cardiac standpoint.  We did not see evidence of pneumonia on chest x-ray.  Your d-dimer to look for blood clot was negative.  Please follow-up with your primary doctor in your cardiologist.  If any symptoms change or worsen, please return to the nearest emergency department.

## 2018-02-14 NOTE — ED Provider Notes (Signed)
[facility] DEPT Provider Note   CSN: [id number] Arrival date & time: 02/14/18  2993     History   Chief Complaint Chief Complaint  Patient presents with  . Chest Pain    HPI Vernon Blackburn is a 53 y.o. male.  The history is provided by the patient, medical records and the spouse.  Chest Pain  Pain location:  Substernal area Pain quality: sharp   Pain radiates to:  Does not radiate Pain severity:  Moderate Onset quality:  Gradual Duration:  2 days Timing:  Intermittent Progression:  Waxing and waning Relieved by:  Rest Worsened by:  Exertion Ineffective treatments:  None tried Associated symptoms: cough, diaphoresis, fatigue and lower extremity edema (chronic)   Associated symptoms: no abdominal pain, no anxiety, no back pain, no dizziness, no fever, no headache, no nausea, no numbness, no palpitations, no shortness of breath, no vomiting and no weakness   Risk factors: male sex and prior DVT/PE     Past Medical History:  Diagnosis Date  . Allergy   . Anemia, chronic disease   . Arthritis    Gout  . Asthma   . Cardiomyopathy ([place])   . CHF (congestive heart failure) ([place])    a. 01/2016: echo showing EF of 20-25%, no WMA, severe MR, and PA Peak Pressure of 52 mm Hg.   Marland Kitchen Chronic systolic HF (heart failure) ([place])   . CKD (chronic kidney disease)    Dr [name]  . Elbow pain, left   . Heart murmur   . History of kidney stones   . Hypertension   . Knee pain, bilateral   . Left ankle pain 11/04/2010   Gout  . Leg pain, bilateral   . Peripheral edema    Bilateral lower extremity   . Pneumonia    hx  . Pulmonary HTN ([place])   . S/P minimally invasive mitral valve repair 06/29/2016   30 mm [name] via right mini thoracotomy approach  . Severe mitral regurgitation   . Sleep apnea    hx bariatric  surgery 3 yrs ago lost 166 lbs  . Syncope and collapse 07/14/2016  . Venous insufficiency     Patient Active Problem  List   Diagnosis Date Noted  . Long term (current) use of anticoagulants [Z79.01] 07/17/2016  . Syncope and collapse 07/14/2016  . Acute pulmonary embolism ([place]) 07/14/2016  . Chest pain 07/13/2016  . Other chest pain   . Coagulopathy ([place])   . S/P minimally invasive mitral valve repair 06/29/2016  . AKI (acute kidney injury) ([place]) 06/07/2016  . CHF (congestive heart failure) ([place]) 06/06/2016  . Hypertensive heart and chronic kidney disease with heart failure and stage 1 through stage 4 chronic kidney disease, or chronic kidney disease ([place]) 02/27/2016  . Chronic systolic HF (heart failure) ([place])   . Cardiomyopathy ([place])   . Acute renal failure superimposed on chronic kidney disease ([place])   . Pulmonary HTN ([place])   . DOE (dyspnea on exertion) 01/02/2016  . Chronic kidney disease (CKD), stage IV (severe) ([place])   . Anemia   . Edema 11/15/2011    Past Surgical History:  Procedure Laterality Date  . GASTRIC BYPASS  2014  . MENISCUS REPAIR Right 05/2008   right  knee  . MITRAL VALVE REPAIR Right 06/29/2016   Procedure: MINIMALLY INVASIVE MITRAL VALVE REPAIR  (MVR);  Surgeon: [name], MD;  Location: [facility];  Service: Open Heart Surgery;  Laterality:  Right;  Marland Kitchen RIGHT/LEFT HEART CATH AND CORONARY ANGIOGRAPHY N/A 06/07/2016   Procedure: Right/Left Heart Cath and Coronary Angiography;  Surgeon: Larey Dresser, MD;  Location: [facility];  Service: Cardiovascular;  Laterality: N/A;  . TEE WITHOUT CARDIOVERSION N/A 05/04/2016   Procedure: TRANSESOPHAGEAL ECHOCARDIOGRAM (TEE);  Surgeon: Larey Dresser, MD;  Location[facility];  Service: Cardiovascular;  Laterality: N/A;  . TEE WITHOUT CARDIOVERSION N/A 06/29/2016   Procedure: TRANSESOPHAGEAL ECHOCARDIOGRAM (TEE);  Surgeon: [name], MD;  Location: [facility];  Service: Open Heart Surgery;  Laterality: N/A;        Home Medications    Prior to Admission medications   Medication Sig Start Date End Date Taking? Authorizing  Provider  albuterol (PROVENTIL HFA;VENTOLIN HFA) 108 (90 Base) MCG/ACT inhaler Inhale 2 puffs into the lungs every 6 (six) hours as needed for wheezing or shortness of breath. 06/11/17   Larey Dresser, MD  amLODipine (NORVASC) 10 MG tablet TAKE 1 TABLET BY MOUTH ONCE DAILY 07/16/17   Larey Dresser, MD  aspirin EC 81 MG tablet Take 1 tablet (81 mg total) by mouth daily. 10/03/16   Larey Dresser, MD  carvedilol (COREG) 25 MG tablet TAKE 1 TABLET BY MOUTH TWICE DAILY 11/08/17   Larey Dresser, MD  cholecalciferol (VITAMIN D) 1000 units tablet Take 1,000 Units by mouth daily.    [provider]  FeAspGl-FeFum-B12-FA-C-Succ Ac (FERREX 28 PO) Take 1 tablet by mouth daily.     [provider]  Febuxostat (ULORIC) 80 MG TABS Take 80 mg by mouth daily.     [provider]  furosemide (LASIX) 40 MG tablet TAKE 2 TABLETS BY MOUTH IN THE MORNING AND 1 IN THE EVENING 10/01/17   Larey Dresser, MD  hydrALAZINE (APRESOLINE) 100 MG tablet Take 1 tablet (100 mg total) by mouth 3 (three) times daily. 04/11/17   Larey Dresser, MD  isosorbide mononitrate (IMDUR) 60 MG 24 hr tablet Take 1 tablet (60 mg total) by mouth daily. 08/23/16 12/17/17  Larey Dresser, MD  Multiple Vitamin (MULTIVITAMIN WITH MINERALS) TABS tablet Take 1 tablet by mouth 2 (two) times daily.     [provider]  potassium chloride SA (K-DUR,KLOR-CON) 20 MEQ tablet TAKE 2 TABLETS BY MOUTH ONCE DAILY 11/08/17   Georgiana Shore, NP  sildenafil (REVATIO) 20 MG tablet Take 20 mg by mouth as needed.    [provider]  tamsulosin (FLOMAX) 0.4 MG CAPS capsule Take 0.4 mg by mouth daily.    [provider]  VYVANSE 60 MG capsule Take by mouth daily. 05/25/17   [provider]    Family History Family History  Problem Relation Age of Onset  . Diabetes Mother   . Hypertension Mother   . Heart failure Mother   . Colon cancer Neg Hx   . Esophageal cancer Neg Hx   . Rectal cancer  Neg Hx   . Stomach cancer Neg Hx     Social History Social History   Tobacco Use  . Smoking status: Never Smoker  . Smokeless tobacco: Never Used  Substance Use Topics  . Alcohol use: Yes    Comment: socially  . Drug use: No     Allergies   No known allergies   Review of Systems Review of Systems  Constitutional: Positive for chills, diaphoresis and fatigue. Negative for fever.  HENT: Negative for congestion and rhinorrhea.   Eyes: Negative for visual disturbance.  Respiratory:  Positive for cough. Negative for chest tightness, shortness of breath, wheezing and stridor.   Cardiovascular: Positive for chest pain. Negative for palpitations and leg swelling (chronic).  Gastrointestinal: Positive for diarrhea. Negative for abdominal pain, blood in stool, constipation, nausea and vomiting.  Genitourinary: Negative for dysuria and flank pain.  Musculoskeletal: Negative for back pain, neck pain and neck stiffness.  Skin: Negative for rash and wound.  Neurological: Negative for dizziness, weakness, light-headedness, numbness and headaches.  Psychiatric/Behavioral: Negative for agitation.  All other systems reviewed and are negative.    Physical Exam Updated Vital Signs BP (!) 128/100 (BP Location: Right Arm)   Pulse 66   Temp 98.6 F (37 C) (Oral)   Resp 15   Ht 5\' 10"  (1.778 m)   Wt 97.5 kg   SpO2 99%   BMI 30.85 kg/m   Physical Exam Vitals signs and nursing note reviewed.  Constitutional:      General: He is not in acute distress.    Appearance: He is well-developed. He is not ill-appearing, toxic-appearing or diaphoretic.  HENT:     Head: Normocephalic and atraumatic.  Eyes:     Conjunctiva/sclera: Conjunctivae normal.     Pupils: Pupils are equal, round, and reactive to light.  Neck:     Musculoskeletal: Neck supple.  Cardiovascular:     Rate and Rhythm: Normal rate and regular rhythm.     Heart sounds: No murmur.  Pulmonary:     Effort: Pulmonary effort  is normal. No respiratory distress.     Breath sounds: Examination of the right-lower field reveals rales. Examination of the left-lower field reveals rales. Rales present. No decreased breath sounds, wheezing or rhonchi.  Chest:     Chest wall: Tenderness present.  Abdominal:     Palpations: Abdomen is soft.     Tenderness: There is no abdominal tenderness.  Musculoskeletal:     Right lower leg: He exhibits no tenderness. No edema.     Left lower leg: He exhibits no tenderness. No edema.  Skin:    General: Skin is warm and dry.     Capillary Refill: Capillary refill takes less than 2 seconds.  Neurological:     General: No focal deficit present.     Mental Status: He is alert.  Psychiatric:        Mood and Affect: Mood normal.      ED Treatments / Results  Labs (all labs ordered are listed, but only abnormal results are displayed) Labs Reviewed  BASIC METABOLIC PANEL - Abnormal; Notable for the following components:      Result Value   Potassium 3.1 (*)    BUN 37 (*)    Creatinine, Ser 2.82 (*)    Calcium 8.1 (*)    GFR calc non Af Amer 25 (*)    GFR calc Af Amer 29 (*)    All other components within normal limits  CBC - Abnormal; Notable for the following components:   WBC 3.8 (*)    Hemoglobin 11.9 (*)    HCT 38.1 (*)    All other components within normal limits  HEPATIC FUNCTION PANEL - Abnormal; Notable for the following components:   Total Protein 6.1 (*)    All other components within normal limits  BRAIN NATRIURETIC PEPTIDE - Abnormal; Notable for the following components:   B Natriuretic Peptide 107.0 (*)    All other components within normal limits  URINALYSIS, ROUTINE W REFLEX MICROSCOPIC - Abnormal; Notable for the following components:  Hgb urine dipstick SMALL (*)    Protein, ur 100 (*)    Bacteria, UA RARE (*)    All other components within normal limits  URINE CULTURE  LIPASE, BLOOD  D-DIMER, QUANTITATIVE (NOT AT [facility])  I-STAT TROPONIN, ED   I-STAT TROPONIN, ED    EKG EKG Interpretation  Date/Time:  Thursday February 14 2018 06:57:09 EST Ventricular Rate:  65 PR Interval:    QRS Duration: 93 QT Interval:  467 QTC Calculation: 486 R Axis:   54 Text Interpretation:  Sinus rhythm Borderline prolonged QT interval Baseline wander in lead(s) V2 When compared to prior, no significnat changes seen.  NO STEMI Confirmed by Antony Blackbird [phone]) on 02/14/2018 7:34:36 AM   Radiology Dg Chest 2 View  Result Date: 02/14/2018 CLINICAL DATA:  Chest pain since yesterday EXAM: CHEST - 2 VIEW COMPARISON:  09/25/2016 FINDINGS: Normal heart size. Mild aortic tortuosity. Mitral valve repair. There is no edema, consolidation, effusion, or pneumothorax. IMPRESSION: No evidence of acute disease. Electronically Signed   By: Monte Fantasia M.D.   On: 02/14/2018 07:31    Procedures Procedures (including critical care time)  Medications Ordered in ED Medications  sodium chloride flush (NS) 0.9 % injection 3 mL (3 mLs Intravenous Given 02/14/18 0737)  potassium chloride SA (K-DUR,KLOR-CON) CR tablet 40 mEq (40 mEq Oral Given 02/14/18 1022)     Initial Impression / Assessment and Plan / ED Course  I have reviewed the triage vital signs and the nursing notes.  Pertinent labs & imaging results that were available during my care of the patient were reviewed by me and considered in my medical decision making (see chart for details).     Vernon Blackburn is a 53 y.o. male with a past medical history significant for pulmonary hypertension, CHF, hypertension, CKD, mitral valve repair, pulmonary embolism not on anticoagulation, gastric bypass, and asthma who presents with chest pain, fatigue, chills, cough, diarrhea, and lightheadedness.  Patient reports that symptoms began yesterday.  He says that he was having chest pain that was central in nature and sharp.  It was worsened with exertion and better with rest.  He reports that he felt his blood  pressure was getting low after he saw his PCP yesterday.  He was reduced from 100 of hydralazine down to 50 yesterday took it before his lightheaded episode.  He reports that he has had some cough that is nonproductive.  He is reported some mild chills.  He reports that when he was having the chest pain significantly yesterday, he had associated diaphoresis.  The pain did not radiate.  He says it was nonpleuritic.  He thinks this feels different than prior PE.  He reports that he has had no significant urinary changes.  He reports that he always has edema from his heart failure but he thinks it may have worsened in his bilateral arms.  He denies any arm pain.  He denies any headache, numbness, tingling, weakness of extremities.  He denies any double vision or blurry vision.  No other symptoms on arrival.  On exam, lungs have crackles in the bases.  Chest is tender to palpation.  Murmur was appreciated.  Abdomen was nontender.  Back nontender.  Patient has pulses in all extremities.  Patient has minimal edema in his legs and minimal edema in his bilateral arms.  Patient was maintaining oxygen saturations on room air on initial evaluation.  Clinically patient may have pulled a muscle while coughing with a URI with  the cough, chills, diarrhea and fatigue however, with his history of heart failure and his worsened edema, will evaluate for exacerbation.  Also concerned about history of PE not on anticoagulation and the sharp chest pain worsened with exertion.  Patient will have a d-dimer as well.  Anticipate reassessment after work-up.  Diagnostic work-up returned showing troponin negative x2.  BNP improved from prior.  D-dimer not significantly elevated.  No evidence of UTI.  Other labs reassuring.  Based on patient's reassuring work-up and improvement in symptoms, patient and family agree with discharge home and outpatient follow-up.  Low suspicion for cardiac etiology of symptoms.  Patient thinks he pulled a  muscle in the setting of coughing.  Patient understands return precautions and follow-up instructions.  Patient understands plan of care.  Patient had no other questions or concerns and was discharged in good condition.   Final Clinical Impressions(s) / ED Diagnoses   Final diagnoses:  Precordial pain  Diarrhea, unspecified type  Cough    ED Discharge Orders    None      Clinical Impression: 1. Precordial pain   2. Diarrhea, unspecified type   3. Cough     Disposition: Discharge  Condition: Good  I have discussed the results, Dx and Tx plan with the pt(& family if present). He/she/they expressed understanding and agree(s) with the plan. Discharge instructions discussed at great length. Strict return precautions discussed and pt &/or family have verbalized understanding of the instructions. No further questions at time of discharge.    Discharge Medication List as of 02/14/2018  2:23 PM      Follow Up: [name], MD [address] [place] [address] [phone]     Your Cardiologist        Pratham Cassatt, Gwenyth Allegra, MD 02/14/18 2127

## 2018-02-14 NOTE — ED Triage Notes (Signed)
Patient complaining of mid chest pain since yesterday. Patient states he went to his doctor and they lowered his bp medication because his blood pressure was low. Patient states that the sharp pain has not stopped and he has started feeling dizzy. Patient is also breaking out into sweats.

## 2018-02-14 NOTE — ED Notes (Signed)
Bed: WA15 Expected date:  Expected time:  Means of arrival:  Comments: 

## 2018-02-15 LAB — URINE CULTURE: Culture: NO GROWTH

## 2018-03-02 ENCOUNTER — Other Ambulatory Visit (HOSPITAL_COMMUNITY): Payer: Self-pay | Admitting: Cardiology

## 2018-05-25 ENCOUNTER — Other Ambulatory Visit (HOSPITAL_COMMUNITY): Payer: Self-pay | Admitting: Cardiology

## 2018-05-28 NOTE — Telephone Encounter (Signed)
Pt needs f/u appt for future refills. Message sent to pharmacy with same

## 2018-06-24 ENCOUNTER — Other Ambulatory Visit: Payer: Self-pay

## 2018-06-24 ENCOUNTER — Emergency Department (HOSPITAL_COMMUNITY)
Admission: EM | Admit: 2018-06-24 | Discharge: 2018-06-25 | Disposition: A | Discharge status: Home / Self Care | Payer: Medicaid Other | Attending: Emergency Medicine | Admitting: Emergency Medicine

## 2018-06-24 DIAGNOSIS — Z5321 Procedure and treatment not carried out due to patient leaving prior to being seen by health care provider: Secondary | ICD-10-CM | POA: Insufficient documentation

## 2018-06-24 DIAGNOSIS — R1011 Right upper quadrant pain: Secondary | ICD-10-CM | POA: Diagnosis present

## 2018-06-25 ENCOUNTER — Other Ambulatory Visit: Payer: Self-pay

## 2018-06-25 ENCOUNTER — Encounter (HOSPITAL_COMMUNITY): Payer: Self-pay

## 2018-06-25 ENCOUNTER — Encounter (HOSPITAL_COMMUNITY): Payer: Self-pay | Admitting: Family Medicine

## 2018-06-25 ENCOUNTER — Emergency Department (HOSPITAL_COMMUNITY)
Admission: EM | Admit: 2018-06-25 | Discharge: 2018-06-25 | Disposition: A | Discharge status: Home / Self Care | Payer: Medicaid Other | Source: Home / Self Care | Attending: Emergency Medicine | Admitting: Emergency Medicine

## 2018-06-25 ENCOUNTER — Emergency Department (HOSPITAL_COMMUNITY): Payer: Medicaid Other

## 2018-06-25 DIAGNOSIS — N184 Chronic kidney disease, stage 4 (severe): Secondary | ICD-10-CM | POA: Insufficient documentation

## 2018-06-25 DIAGNOSIS — Z79899 Other long term (current) drug therapy: Secondary | ICD-10-CM | POA: Insufficient documentation

## 2018-06-25 DIAGNOSIS — Z7982 Long term (current) use of aspirin: Secondary | ICD-10-CM | POA: Insufficient documentation

## 2018-06-25 DIAGNOSIS — I130 Hypertensive heart and chronic kidney disease with heart failure and stage 1 through stage 4 chronic kidney disease, or unspecified chronic kidney disease: Secondary | ICD-10-CM | POA: Insufficient documentation

## 2018-06-25 DIAGNOSIS — N200 Calculus of kidney: Secondary | ICD-10-CM | POA: Insufficient documentation

## 2018-06-25 DIAGNOSIS — R188 Other ascites: Secondary | ICD-10-CM

## 2018-06-25 DIAGNOSIS — K8020 Calculus of gallbladder without cholecystitis without obstruction: Secondary | ICD-10-CM | POA: Insufficient documentation

## 2018-06-25 DIAGNOSIS — I5022 Chronic systolic (congestive) heart failure: Secondary | ICD-10-CM | POA: Insufficient documentation

## 2018-06-25 LAB — COMPREHENSIVE METABOLIC PANEL
ALT: 18 U/L (ref 0–44)
AST: 22 U/L (ref 15–41)
Albumin: 3.5 g/dL (ref 3.5–5.0)
Alkaline Phosphatase: 62 U/L (ref 38–126)
Anion gap: 10 (ref 5–15)
BUN: 30 mg/dL — ABNORMAL HIGH (ref 6–20)
CO2: 25 mmol/L (ref 22–32)
Calcium: 8.2 mg/dL — ABNORMAL LOW (ref 8.9–10.3)
Chloride: 107 mmol/L (ref 98–111)
Creatinine, Ser: 2.9 mg/dL — ABNORMAL HIGH (ref 0.61–1.24)
GFR calc Af Amer: 28 mL/min — ABNORMAL LOW (ref >60)
GFR calc non Af Amer: 24 mL/min — ABNORMAL LOW (ref >60)
Glucose, Bld: 82 mg/dL (ref 70–99)
Potassium: 4 mmol/L (ref 3.5–5.1)
Sodium: 142 mmol/L (ref 135–145)
Total Bilirubin: 0.4 mg/dL (ref 0.3–1.2)
Total Protein: 6.2 g/dL — ABNORMAL LOW (ref 6.5–8.1)

## 2018-06-25 LAB — CBC
HCT: 44.4 % (ref 39.0–52.0)
Hemoglobin: 13.9 g/dL (ref 13.0–17.0)
MCH: 29.3 pg (ref 26.0–34.0)
MCHC: 31.3 g/dL (ref 30.0–36.0)
MCV: 93.7 fL (ref 80.0–100.0)
Platelets: 218 10*3/uL (ref 150–400)
RBC: 4.74 MIL/uL (ref 4.22–5.81)
RDW: 14.5 % (ref 11.5–15.5)
WBC: 6.7 10*3/uL (ref 4.0–10.5)
nRBC: 0 % (ref 0.0–0.2)

## 2018-06-25 LAB — URINALYSIS, ROUTINE W REFLEX MICROSCOPIC
Bacteria, UA: NONE SEEN
Bilirubin Urine: NEGATIVE
Glucose, UA: NEGATIVE mg/dL
Ketones, ur: NEGATIVE mg/dL
Leukocytes,Ua: NEGATIVE
Nitrite: NEGATIVE
Protein, ur: 100 mg/dL — AB
Specific Gravity, Urine: 1.012 (ref 1.005–1.030)
pH: 5 (ref 5.0–8.0)

## 2018-06-25 LAB — LIPASE, BLOOD: Lipase: 29 U/L (ref 11–51)

## 2018-06-25 MED ORDER — SODIUM CHLORIDE 0.9% FLUSH: 3.0000 mL | Freq: Once | INTRAVENOUS | Status: DC

## 2018-06-25 MED ORDER — ONDANSETRON 4 MG PO TBDP: 4.0000 mg | ORAL_TABLET | Freq: Three times a day (TID) | ORAL | 0 refills | Status: DC | PRN

## 2018-06-25 MED ORDER — HYDROCODONE-ACETAMINOPHEN 5-325 MG PO TABS: 2.0000 | ORAL_TABLET | ORAL | 0 refills | Status: DC | PRN

## 2018-06-25 MED ORDER — MORPHINE SULFATE (PF) 4 MG/ML IV SOLN
4.0000 mg | Freq: Once | INTRAVENOUS | Status: AC
  Administered 2018-06-25: 4 mg via INTRAVENOUS
  Filled 2018-06-25: qty 1

## 2018-06-25 NOTE — Discharge Instructions (Addendum)
You have been diagnosed today with right-sided kidney stone, gallstones, small amount of ascites.  At this time there does not appear to be the presence of an emergent medical condition, however there is always the potential for conditions to change. Please read and follow the below instructions.  Please return to the Emergency Department immediately for any new or worsening symptoms or if your symptoms do not improve within 2 days. Please be sure to follow up with your Primary Care Provider within one week regarding your visit today; please call their office to schedule an appointment even if you are feeling better for a follow-up visit. Please call your urologist tomorrow morning to schedule a follow-up appointment regarding your kidney stone.  You have one kidney stone in your ureter at this time as well as another one in your right kidney.  You may use the pain medication Norco as prescribed to help with severe pain, do not drive, drink alcohol or operate heavy machinery while taking Norco as this will make you drowsy and worsen side effects.  You may take the medication Zofran as prescribed to help with nausea and vomiting.  Please continue to take your Flomax as you already prescribed by your urologist to help facilitate passage of your kidney stone. Additionally your CT scan today showed gallstones and a small amount of ascites in your abdomen, please discuss these results with your primary care provider during your next visit.  Get help right away if: You have a fever or chills. You get very bad pain. You get new pain in your belly (abdomen). You pass out (faint). You cannot pee. You have sudden pain in the upper right side of your belly You have a fever or chills. You keep feeling sick to your stomach or you keep throwing up. Your skin or the whites of your eyes turn yellow (jaundice). You have dark-colored pee (urine). You have light-colored poop (stool). You have chest pain or trouble  breathing Any new/concerning or worsening symptoms  Please read the additional information packets attached to your discharge summary.  Do not take your medicine if  develop an itchy rash, swelling in your mouth or lips, or difficulty breathing; call 911 and seek immediate emergency medical attention if this occurs.

## 2018-06-25 NOTE — ED Notes (Signed)
ED Provider at bedside. 

## 2018-06-25 NOTE — ED Triage Notes (Signed)
Patient is complaining of right upper abd pain that started around 21:30. Reports he has experiencing nausea and vomiting but denies fever or constipation.

## 2018-06-25 NOTE — ED Notes (Signed)
Unsuccessful IV attempt x2. Carley RN asked to attempt.

## 2018-06-25 NOTE — ED Notes (Signed)
Unsuccessful IV attempt x2 by Nila Nephew RN. IV consult placed.

## 2018-06-25 NOTE — ED Triage Notes (Signed)
Patient arrived via POV.   C/o 8/10  right upper/lower abdominal pain that started yesterday around 9:30 PM, N/V, and diarrhea.   3 occurences of emesis last night.  C/O nausea in triage.   3 occurences of diarrhea last night.       Patient states the pain is also in right hip since last night.    A/ox4 Ambulatory in triage.

## 2018-06-25 NOTE — ED Notes (Signed)
Unsuccessful IV attempt by PA.

## 2018-06-25 NOTE — ED Provider Notes (Signed)
[facility] DEPT Provider Note   CSN: [id number] Arrival date & time: 06/25/18  1029    History   Chief Complaint Chief Complaint  Patient presents with  . Abdominal Pain  . Emesis    HPI Vernon Blackburn is a 53 y.o. male with history of cardiomyopathy, CHF, CKD, kidney stones, hypertension, mitral regurg, gastric bypass presents today for abdominal pain, nausea/vomiting/diarrhea.  Patient reports 3 episodes of nonbloody/nonbilious emesis that began at an unknown time last night as well as 3 episodes of nonbloody diarrhea.  He describes his abdominal pain is a primarily right-sided constant pain severe sharp worsened with palpation and without alleviating factors.  Patient reports that his pain has slightly decreased since ED arrival.  He reports he has never had pain like this before.  Denies fever/chills, chest pain/shortness of breath, dysuria/hematuria, testicular pain/swelling or any additional concerns.     HPI  Past Medical History:  Diagnosis Date  . Allergy   . Anemia, chronic disease   . Arthritis    Gout  . Asthma   . Cardiomyopathy ([place])   . CHF (congestive heart failure) ([place])    a. 01/2016: echo showing EF of 20-25%, no WMA, severe MR, and PA Peak Pressure of 52 mm Hg.   Marland Kitchen Chronic systolic HF (heart failure) ([place])   . CKD (chronic kidney disease)    Dr [name]  . Elbow pain, left   . Heart murmur   . History of kidney stones   . Hypertension   . Knee pain, bilateral   . Left ankle pain 11/04/2010   Gout  . Leg pain, bilateral   . Peripheral edema    Bilateral lower extremity   . Pneumonia    hx  . Pulmonary HTN ([place])   . S/P minimally invasive mitral valve repair 06/29/2016   30 mm [name] via right mini thoracotomy approach  . Severe mitral regurgitation   . Sleep apnea    hx bariatric  surgery 3 yrs ago lost 166 lbs  . Syncope and collapse 07/14/2016  . Venous insufficiency     Patient Active  Problem List   Diagnosis Date Noted  . Long term (current) use of anticoagulants [Z79.01] 07/17/2016  . Syncope and collapse 07/14/2016  . Acute pulmonary embolism ([place]) 07/14/2016  . Chest pain 07/13/2016  . Other chest pain   . Coagulopathy ([place])   . S/P minimally invasive mitral valve repair 06/29/2016  . AKI (acute kidney injury) ([place]) 06/07/2016  . CHF (congestive heart failure) ([place]) 06/06/2016  . Hypertensive heart and chronic kidney disease with heart failure and stage 1 through stage 4 chronic kidney disease, or chronic kidney disease ([place]) 02/27/2016  . Chronic systolic HF (heart failure) ([place])   . Cardiomyopathy ([place])   . Acute renal failure superimposed on chronic kidney disease ([place])   . Pulmonary HTN ([place])   . DOE (dyspnea on exertion) 01/02/2016  . Chronic kidney disease (CKD), stage IV (severe) ([place])   . Anemia   . Edema 11/15/2011    Past Surgical History:  Procedure Laterality Date  . GASTRIC BYPASS  2014  . MENISCUS REPAIR Right 05/2008   right  knee  . MITRAL VALVE REPAIR Right 06/29/2016   Procedure: MINIMALLY INVASIVE MITRAL VALVE REPAIR  (MVR);  Surgeon: [name], MD;  Location: [facility];  Service: Open Heart Surgery;  Laterality: Right;  . RIGHT/LEFT HEART CATH AND CORONARY ANGIOGRAPHY N/A 06/07/2016   Procedure:  Right/Left Heart Cath and Coronary Angiography;  Surgeon: Larey Dresser, MD;  Location: [facility];  Service: Cardiovascular;  Laterality: N/A;  . TEE WITHOUT CARDIOVERSION N/A 05/04/2016   Procedure: TRANSESOPHAGEAL ECHOCARDIOGRAM (TEE);  Surgeon: Larey Dresser, MD;  Location: [facility] ENDOSCOPY;  Service: Cardiovascular;  Laterality: N/A;  . TEE WITHOUT CARDIOVERSION N/A 06/29/2016   Procedure: TRANSESOPHAGEAL ECHOCARDIOGRAM (TEE);  Surgeon: [name], MD;  Location: [facility];  Service: Open Heart Surgery;  Laterality: N/A;        Home Medications    Prior to Admission medications   Medication Sig Start Date End Date Taking?  Authorizing Provider  albuterol (PROVENTIL HFA;VENTOLIN HFA) 108 (90 Base) MCG/ACT inhaler Inhale 2 puffs into the lungs every 6 (six) hours as needed for wheezing or shortness of breath. 06/11/17   Larey Dresser, MD  amLODipine (NORVASC) 10 MG tablet TAKE 1 TABLET BY MOUTH ONCE DAILY Patient taking differently: Take 10 mg by mouth daily.  07/16/17   Larey Dresser, MD  aspirin EC 81 MG tablet Take 1 tablet (81 mg total) by mouth daily. 10/03/16   Larey Dresser, MD  carvedilol (COREG) 25 MG tablet TAKE 1 TABLET BY MOUTH TWICE DAILY Patient taking differently: Take 25 mg by mouth 2 (two) times daily with a meal.  11/08/17   Larey Dresser, MD  Febuxostat (ULORIC) 80 MG TABS Take 80 mg by mouth daily.     [provider]  Ferrous Sulfate (IRON) 325 (65 Fe) MG TABS Take 325 mg by mouth 3 (three) times daily. 01/09/18   [provider]  furosemide (LASIX) 40 MG tablet TAKE 2 TABLETS BY MOUTH IN THE MORNING AND 1 IN THE EVENING 03/04/18   Larey Dresser, MD  hydrALAZINE (APRESOLINE) 100 MG tablet TAKE 1 TABLET BY MOUTH THREE TIMES DAILY 05/28/18   Larey Dresser, MD  HYDROcodone-acetaminophen (NORCO/VICODIN) 5-325 MG tablet Take 2 tablets by mouth every 4 (four) hours as needed. 06/25/18   Nuala Alpha A, PA-C  isosorbide mononitrate (IMDUR) 60 MG 24 hr tablet Take 1 tablet (60 mg total) by mouth daily. 08/23/16 02/14/18  Larey Dresser, MD  Menthol-Camphor (ICY HOT ADVANCED RELIEF) 16-11 % CREA Apply 1 application topically as needed (joint pain).    [provider]  Multiple Vitamin (MULTIVITAMIN WITH MINERALS) TABS tablet Take 1 tablet by mouth 2 (two) times daily.     [provider]  ondansetron (ZOFRAN ODT) 4 MG disintegrating tablet Take 1 tablet (4 mg total) by mouth every 8 (eight) hours as needed for nausea or vomiting. 06/25/18   Nuala Alpha A, PA-C  polyvinyl alcohol (LIQUIFILM TEARS) 1.4 % ophthalmic solution Place 1 drop into both eyes as  needed for dry eyes (allergies).    [provider]  potassium chloride SA (K-DUR,KLOR-CON) 20 MEQ tablet TAKE 2 TABLETS BY MOUTH ONCE DAILY Patient taking differently: Take 40 mEq by mouth daily.  11/08/17   Georgiana Shore, NP  sildenafil (REVATIO) 20 MG tablet Take 20 mg by mouth as needed.    [provider]  sodium-potassium bicarbonate (ALKA-SELTZER GOLD) TBEF dissolvable tablet Take 1 tablet by mouth daily as needed (cold symptoms).    [provider]  tamsulosin (FLOMAX) 0.4 MG CAPS capsule Take 0.4 mg by mouth daily.    [provider]  VYVANSE 60 MG capsule Take 60 mg by mouth daily.  05/25/17   [provider]    Family History Family  History  Problem Relation Age of Onset  . Diabetes Mother   . Hypertension Mother   . Heart failure Mother   . Colon cancer Neg Hx   . Esophageal cancer Neg Hx   . Rectal cancer Neg Hx   . Stomach cancer Neg Hx     Social History Social History   Tobacco Use  . Smoking status: Never Smoker  . Smokeless tobacco: Never Used  Substance Use Topics  . Alcohol use: Yes    Comment: Once a month   . Drug use: No     Allergies   Patient has no active allergies.   Review of Systems Review of Systems  Constitutional: Negative for chills and fever.  Respiratory: Negative.  Negative for cough and shortness of breath.   Cardiovascular: Negative.  Negative for chest pain.  Gastrointestinal: Positive for abdominal pain, diarrhea, nausea and vomiting.  Genitourinary: Negative.  Negative for dysuria, hematuria, scrotal swelling and testicular pain.  All other systems reviewed and are negative.  Physical Exam Updated Vital Signs BP (!) 157/105 (BP Location: Left Arm)   Pulse 66   Temp 97.7 F (36.5 C) (Oral)   Resp 18   Ht 5\' 9"  (1.753 m)   Wt 98.4 kg   SpO2 100%   BMI 32.05 kg/m   Physical Exam Constitutional:      General: He is not in acute distress.    Appearance: Normal appearance. He  is well-developed. He is not ill-appearing or diaphoretic.  HENT:     Head: Normocephalic and atraumatic.     Right Ear: External ear normal.     Left Ear: External ear normal.     Nose: Nose normal.  Eyes:     General: Vision grossly intact. Gaze aligned appropriately.     Pupils: Pupils are equal, round, and reactive to light.  Neck:     Musculoskeletal: Normal range of motion.     Trachea: Trachea and phonation normal. No tracheal deviation.  Cardiovascular:     Rate and Rhythm: Normal rate and regular rhythm.     Heart sounds: Normal heart sounds.  Pulmonary:     Effort: Pulmonary effort is normal. No respiratory distress.  Abdominal:     General: There is no distension.     Palpations: Abdomen is soft.     Tenderness: There is abdominal tenderness in the right upper quadrant and right lower quadrant. There is no right CVA tenderness, left CVA tenderness, guarding or rebound. Positive signs include McBurney's sign. Negative signs include Murphy's sign and Rovsing's sign.  Musculoskeletal: Normal range of motion.  Skin:    General: Skin is warm and dry.  Neurological:     Mental Status: He is alert.     GCS: GCS eye subscore is 4. GCS verbal subscore is 5. GCS motor subscore is 6.     Comments: Speech is clear and goal oriented, follows commands Major Cranial nerves without deficit, no facial droop Moves extremities without ataxia, coordination intact  Psychiatric:        Behavior: Behavior normal.    ED Treatments / Results  Labs (all labs ordered are listed, but only abnormal results are displayed) Labs Reviewed  COMPREHENSIVE METABOLIC PANEL - Abnormal; Notable for the following components:      Result Value   BUN 30 (*)    Creatinine, Ser 2.90 (*)    Calcium 8.2 (*)    Total Protein 6.2 (*)    GFR calc non Af Wyvonnia Lora  24 (*)    GFR calc Af Amer 28 (*)    All other components within normal limits  URINALYSIS, ROUTINE W REFLEX MICROSCOPIC - Abnormal; Notable for the  following components:   Hgb urine dipstick SMALL (*)    Protein, ur 100 (*)    All other components within normal limits  LIPASE, BLOOD  CBC    EKG None  Radiology Ct Abdomen Pelvis Wo Contrast  Result Date: 06/25/2018 CLINICAL DATA:  Right-sided abdominal pain with nausea, vomiting, and diarrhea. EXAM: CT ABDOMEN AND PELVIS WITHOUT CONTRAST TECHNIQUE: Multidetector CT imaging of the abdomen and pelvis was performed following the standard protocol without IV contrast. COMPARISON:  CT chest dated July 21, 2016. FINDINGS: Lower chest: No acute abnormality. Linear scarring in the right middle lobe. Calcified granuloma and focal scarring in the right lower lobe. Hepatobiliary: 1.4 cm simple cyst in the inferior right hepatic lobe. Tiny layering gallstones. No gallbladder wall thickening or biliary dilatation. Pancreas: Unremarkable. No pancreatic ductal dilatation or surrounding inflammatory changes. Spleen: Normal in size without focal abnormality. Adrenals/Urinary Tract: The adrenal glands are unremarkable. Two punctate nonobstructive calculi in the lower pole of the right kidney. 2 mm calculus at the right UVJ. No hydronephrosis. 5 mm calculus in the bladder. Mild circumferential bladder wall thickening may be related to chronic bladder outlet obstruction. Stomach/Bowel: Prior Roux-en-Y gastric bypass surgery. Appendix appears normal. No evidence of bowel wall thickening, distention, or inflammatory changes. Vascular/Lymphatic: No significant vascular findings are present. No enlarged abdominal or pelvic lymph nodes. Reproductive: Mild prostatomegaly with central gland hypertrophy indenting the bladder base. Other: Scattered trace ascites. Small right inguinal hernia containing fat and ascites. No pneumoperitoneum. Musculoskeletal: No acute or significant osseous findings. IMPRESSION: 1. 2 mm calculus at the right UVJ.  No hydronephrosis. 2. 5 mm bladder calculus. Punctate nonobstructive right  nephrolithiasis. 3. Cholelithiasis. 4. Scattered trace ascites. Electronically Signed   By: Titus Dubin M.D.   On: 06/25/2018 15:31    Procedures Procedures (including critical care time)  Medications Ordered in ED Medications  morphine 4 MG/ML injection 4 mg (4 mg Intravenous Given 06/25/18 1404)   Initial Impression / Assessment and Plan / ED Course  I have reviewed the triage vital signs and the nursing notes.  Pertinent labs & imaging results that were available during my care of the patient were reviewed by me and considered in my medical decision making (see chart for details).    Mild right abdominal tenderness on exam greater in the right lower quadrant, negative Murphy sign, normal bowel sounds, no peritoneal signs or distention.  Lungs clear to auscultation bilaterally, heart regular rate and rhythm, patient overall well-appearing and in no acute distress. - CBC within normal limits CMP with creatinine 2.9, BUN 30 appears baseline for patient Lipase within normal limits Urinalysis with small hemoglobin and 100 protein, no signs of infection CT abd/pelvis:  IMPRESSION:  1. 2 mm calculus at the right UVJ. No hydronephrosis.  2. 5 mm bladder calculus. Punctate nonobstructive right  nephrolithiasis.  3. Cholelithiasis.  4. Scattered trace ascites.  - Patient reassessed resting comfortably no acute distress, pain resolved following morphine today.  Tolerating p.o. Vital signs stable.  Abdomen is soft and without distention on reassessment, mild right lower quadrant abdominal pain, negative Murphy sign.  Patient updated on lab work today and states understanding, he is already established with Dr. Gilford Rile at [facility] urology.  I have encouraged him to call his urologist tomorrow morning to discuss results and  to establish a follow-up appointment.  Patient takes Flomax 0.4 mg daily already and does not require a refill, will give patient pain control, Norco for his acute pain  related to his kidney stone, will give patient Zofran every 8 hours for nausea as needed.  PMP reviewed without evidence of recent narcotic medications.  At this time there does not appear to be any evidence of an acute emergency medical condition and the patient appears stable for discharge with appropriate outpatient follow up. Diagnosis was discussed with patient who verbalizes understanding of care plan and is agreeable to discharge. I have discussed return precautions with patient who verbalizes understanding of return precautions. Patient encouraged to follow-up with their PCP and urology. All questions answered.  Patient's case discussed with Dr. Regenia Skeeter who agrees with plan to discharge with follow-up.   Note: Portions of this report may have been transcribed using voice recognition software. Every effort was made to ensure accuracy; however, inadvertent computerized transcription errors may still be present. Final Clinical Impressions(s) / ED Diagnoses   Final diagnoses:  Right nephrolithiasis  Calculus of gallbladder without cholecystitis without obstruction  Other ascites    ED Discharge Orders         Ordered    HYDROcodone-acetaminophen (NORCO/VICODIN) 5-325 MG tablet  Every 4 hours PRN     06/25/18 1649    ondansetron (ZOFRAN ODT) 4 MG disintegrating tablet  Every 8 hours PRN     06/25/18 1649           Deliah Boston, PA-C 06/25/18 1656    Sherwood Gambler, MD 06/25/18 2201

## 2018-06-25 NOTE — ED Notes (Signed)
IV team at bedside 

## 2018-07-02 ENCOUNTER — Other Ambulatory Visit (HOSPITAL_COMMUNITY): Payer: Self-pay | Admitting: Cardiology

## 2018-07-02 DIAGNOSIS — I10 Essential (primary) hypertension: Secondary | ICD-10-CM

## 2018-07-04 ENCOUNTER — Telehealth (HOSPITAL_COMMUNITY): Payer: Self-pay

## 2018-07-04 NOTE — Telephone Encounter (Signed)
Surgical clearance faxed to [facility].  Confirmation received.

## 2018-07-12 ENCOUNTER — Other Ambulatory Visit: Payer: Self-pay | Admitting: Urology

## 2018-07-13 ENCOUNTER — Other Ambulatory Visit (HOSPITAL_COMMUNITY)
Admission: RE | Admit: 2018-07-13 | Discharge: 2018-07-13 | Disposition: A | Discharge status: Home / Self Care | Payer: Medicaid Other | Source: Ambulatory Visit | Attending: Urology | Admitting: Urology

## 2018-07-13 DIAGNOSIS — Z1159 Encounter for screening for other viral diseases: Secondary | ICD-10-CM | POA: Insufficient documentation

## 2018-07-13 DIAGNOSIS — Z01812 Encounter for preprocedural laboratory examination: Secondary | ICD-10-CM | POA: Insufficient documentation

## 2018-07-13 LAB — SARS CORONAVIRUS 2 (TAT 6-24 HRS): SARS Coronavirus 2: NEGATIVE

## 2018-07-15 NOTE — Progress Notes (Signed)
TRANSPLANT EVAL NOTE 10-24-17 CARE EVERYWHERE EKG 02-14-18 Epic CHEST XRAY 02-24-18 EPIC

## 2018-07-15 NOTE — Patient Instructions (Addendum)
YOU  HAD  A COVID 19 TEST ON 07-13-2018.  PLEASE BEGIN THE QUARANTINE INSTRUCTIONS AS OUTLINED IN YOUR HANDOUT.                Vernon Blackburn     Your procedure is scheduled on: 07-17-2018  Report to [facility] Main  Entrance    Report to admitting at 9:15 AM      Call this number if you have problems the morning of surgery [phone]    Remember: Do not eat food or drink liquids :After Midnight. BRUSH YOUR TEETH MORNING OF SURGERY AND RINSE YOUR MOUTH OUT, NO CHEWING GUM CANDY OR MINTS.     Take these medicines the morning of surgery with A SIP OF WATER: Amlodipine, carvedilol, hydralazine, isosorbide , pro-air inhaler If needed (please bring)                                 You may not have any metal on your body including hair pins and              piercings  Do not wear jewelry, make-up, lotions, powders or perfumes, deodorant                        Men may shave face and neck.   Do not bring valuables to the hospital. [place].  Contacts, dentures or bridgework may not be worn into surgery.  Leave suitcase in the car. After surgery it may be brought to your room.     Patients discharged the day of surgery will not be allowed to drive home. IF YOU ARE HAVING SURGERY AND GOING HOME THE SAME DAY, YOU MUST HAVE AN ADULT TO DRIVE YOU HOME AND BE WITH YOU FOR 24 HOURS. YOU MAY GO HOME BY TAXI OR UBER OR ORTHERWISE, BUT AN ADULT MUST ACCOMPANY YOU HOME AND STAY WITH YOU FOR 24 HOURS.  Name and phone number of your driver:  Special Instructions: N/A              Please read over the following fact sheets you were given: _____________________________________________________________________             [facility] - Preparing for Surgery Before surgery, you can play an important role.  Because skin is not sterile, your skin needs to be as free of germs as possible.  You can reduce the number of germs on your skin by  washing with CHG (chlorahexidine gluconate) soap before surgery.  CHG is an antiseptic cleaner which kills germs and bonds with the skin to continue killing germs even after washing. Please DO NOT use if you have an allergy to CHG or antibacterial soaps.  If your skin becomes reddened/irritated stop using the CHG and inform your nurse when you arrive at Short Stay. Do not shave (including legs and underarms) for at least 48 hours prior to the first CHG shower.  You may shave your face/neck. Please follow these instructions carefully:  1.  Shower with CHG Soap the night before surgery and the  morning of Surgery.  2.  If you choose to wash your hair, wash your hair first as usual with your  normal  shampoo.  3.  After you shampoo, rinse your hair and body thoroughly to  remove the  shampoo.                           4.  Use CHG as you would any other liquid soap.  You can apply chg directly  to the skin and wash                       Gently with a scrungie or clean washcloth.  5.  Apply the CHG Soap to your body ONLY FROM THE NECK DOWN.   Do not use on face/ open                           Wound or open sores. Avoid contact with eyes, ears mouth and genitals (private parts).                       Wash face,  Genitals (private parts) with your normal soap.             6.  Wash thoroughly, paying special attention to the area where your surgery  will be performed.  7.  Thoroughly rinse your body with warm water from the neck down.  8.  DO NOT shower/wash with your normal soap after using and rinsing off  the CHG Soap.                9.  Pat yourself dry with a clean towel.            10.  Wear clean pajamas.            11.  Place clean sheets on your bed the night of your first shower and do not  sleep with pets. Day of Surgery : Do not apply any lotions/deodorants the morning of surgery.  Please wear clean clothes to the hospital/surgery center.  FAILURE TO FOLLOW THESE INSTRUCTIONS MAY RESULT IN THE  CANCELLATION OF YOUR SURGERY PATIENT SIGNATURE_________________________________  NURSE SIGNATURE__________________________________  ________________________________________________________________________

## 2018-07-16 ENCOUNTER — Other Ambulatory Visit: Payer: Self-pay

## 2018-07-16 ENCOUNTER — Encounter (HOSPITAL_COMMUNITY)
Admission: RE | Admit: 2018-07-16 | Discharge: 2018-07-16 | Disposition: A | Discharge status: Home / Self Care | Payer: Medicaid Other | Source: Ambulatory Visit | Attending: Urology | Admitting: Urology

## 2018-07-16 ENCOUNTER — Encounter (HOSPITAL_COMMUNITY): Payer: Self-pay

## 2018-07-16 DIAGNOSIS — Z01812 Encounter for preprocedural laboratory examination: Secondary | ICD-10-CM | POA: Diagnosis not present

## 2018-07-16 DIAGNOSIS — N210 Calculus in bladder: Secondary | ICD-10-CM | POA: Insufficient documentation

## 2018-07-16 DIAGNOSIS — N211 Calculus in urethra: Secondary | ICD-10-CM | POA: Insufficient documentation

## 2018-07-16 LAB — CBC
HCT: 40.7 % (ref 39.0–52.0)
Hemoglobin: 12.8 g/dL — ABNORMAL LOW (ref 13.0–17.0)
MCH: 28.8 pg (ref 26.0–34.0)
MCHC: 31.4 g/dL (ref 30.0–36.0)
MCV: 91.7 fL (ref 80.0–100.0)
Platelets: 232 10*3/uL (ref 150–400)
RBC: 4.44 MIL/uL (ref 4.22–5.81)
RDW: 14.4 % (ref 11.5–15.5)
WBC: 6.3 10*3/uL (ref 4.0–10.5)
nRBC: 0 % (ref 0.0–0.2)

## 2018-07-16 LAB — BASIC METABOLIC PANEL
Anion gap: 11 (ref 5–15)
BUN: 62 mg/dL — ABNORMAL HIGH (ref 6–20)
CO2: 22 mmol/L (ref 22–32)
Calcium: 8.1 mg/dL — ABNORMAL LOW (ref 8.9–10.3)
Chloride: 109 mmol/L (ref 98–111)
Creatinine, Ser: 3.55 mg/dL — ABNORMAL HIGH (ref 0.61–1.24)
GFR calc Af Amer: 22 mL/min — ABNORMAL LOW (ref >60)
GFR calc non Af Amer: 19 mL/min — ABNORMAL LOW (ref >60)
Glucose, Bld: 88 mg/dL (ref 70–99)
Potassium: 4 mmol/L (ref 3.5–5.1)
Sodium: 142 mmol/L (ref 135–145)

## 2018-07-16 NOTE — Progress Notes (Addendum)
Pre-op appt progress note :   Patient presents to pre-op with elevated blood pressure readings, highest reading was 176/123. Other vitals WDL. Patient reports headache to right temple 6/10 throbbing. Patient denies chest pain , nor vision change, nor palpitations. Patient reports work and relationship stressors and pin from bladder stone. Patient reports he is sporadic with taking his blood pressure medication , repots he is medication adherence is 8/10. He states he did not take his BP meds this morning as he was ina rush to get here but he did take them yesterday.  NO clearance on chart or in epic although telephone encounter in epic  dated 07-04-2018 mentions " surgical clearance faxed to [facility]. Confirmation received. "   APP made aware of the above . APP completed face to face consult with patient.    EKG on chart received from [facility] on chart dated 09-24-2017.  Update:  RN called and spoke with Dr Jackson Latino surgery scheduler, [facility] and  made her  aware of the above and requested cardiac clearance. Coni acknowledged and states she will fax the clearance.

## 2018-07-16 NOTE — Progress Notes (Signed)
ekg 09-24-17 [facility] on chart

## 2018-07-16 NOTE — Progress Notes (Signed)
Anesthesia Chart Review:   Case: [id number] Date/Time: 07/17/18 1100   Procedure: CYSTOSCOPY/RETROGRADE/URETEROSCOPY/HOLMIUM LASER/STENT PLACEMENT/ CYSTOLITHALOPAXY (Right )   Anesthesia type: General   Pre-op diagnosis: RIGHT URETERAL STONE, BLADDER STONE   Location: [place] / [facility]   Surgeon: Ceasar Mons, MD      DISCUSSION:  Pt is a 53 year old male with hx CHF, cardiomyopathy, pulmonary HTN, severe mitral regurgitation (s/p valve repair 2018), HTN, CKD (stage 4).   - At presurgical testing, pt's BP was very elevated at 160/130.  Pt did not take his medication today. He was instructed to take medicine as prescribed as soon as he got home.  Pt understands surgery could be cancelled if BP elevated day of surgery.   - Cardiologist is Loralie Champagne who has given ok for pt to have surgery. As pt hasn't been seen in over a year, I reached out to [name] about pt.  He felt as long as pt didn't have new/worsening sx (CP, SOB), and was at his baseline, he could proceed with surgery.    - Reviewed case with Dr. Daiva Huge.   VS: BP (!) 162/121 Comment: left arm  Pulse 81   Temp 37 C (Oral)   Resp 16   Ht 5\' 9"  (1.753 m)   Wt 102.1 kg   SpO2 100%   BMI 33.24 kg/m    - BP initially 160/130. Best reading on recheck was 162/121.      PROVIDERS: - PCP is [name], MD - Cardiologist is Loralie Champagne, MD. Last office visit 06/11/17; 4 month f/u recommended but has not happened.  [name] signed clearance form for procedure.    LABS:  - Cr 3.55, BUN 62.  This is consistent with pt's baseline (has CKD 4).   (all labs ordered are listed, but only abnormal results are displayed)  Labs Reviewed  BASIC METABOLIC PANEL - Abnormal; Notable for the following components:      Result Value   BUN 62 (*)    Creatinine, Ser 3.55 (*)    Calcium 8.1 (*)    GFR calc non Af Amer 19 (*)    GFR calc Af Amer 22 (*)    All other components within normal limits  CBC  - Abnormal; Notable for the following components:   Hemoglobin 12.8 (*)    All other components within normal limits     IMAGES:  CXR 02/14/18: No evidence of acute disease.   EKG 02/14/18: Sinus rhythm. Borderline prolonged QT interval. Baseline wander in lead(s) V2   CV:  Echo 04/13/17:  - Left ventricle: The cavity size was severely dilated. Systolic function was mildly to moderately reduced. The estimated ejection fraction was in the range of 40% to 45%. Wall motion was normal; there were no regional wall motion abnormalities. Features are consistent with a pseudonormal left ventricular filling pattern, with concomitant abnormal relaxation and increased filling pressure (grade 2 diastolic dysfunction). Doppler parameters are consistent with high ventricular filling pressure. - Aortic valve: Trileaflet; mildly thickened, mildly calcified leaflets. - Mitral valve: S/P MV repair and annuloplasty ring. The findings are consistent with mild stenosis. Mean gradient (D): 4 mm Hg. Valve area by pressure half-time: 1.54 cm^2. - Left atrium: The atrium was mildly dilated. - Right ventricle: The cavity size was severely dilated. Wall thickness was normal.  Exercise tolerance test 08/02/16:  1.  Poor exercise tolerance.  2. Submaximal study.  No evidence for ischemia by ST segment analysis  but only reached 59% of MPHR.   R/L cardiac cath 06/07/16:  1. No significant coronary disease.  Minimal contrast used with angiography.  2. Severely elevated right and left heart filling pressures.  PCWP elevated more than LVEDP.  3. Preserved cardiac output.  4. Pulmonary venous hypertension.     Past Medical History:  Diagnosis Date  . Allergy   . Anemia, chronic disease   . Arthritis    Gout  . Asthma   . Cardiomyopathy [facility])    cardiologist is [employer]? [name], lov in office per patient was last year in 2019   . CHF (congestive heart failure) ([place])    a. 01/2016: echo showing EF of  20-25%, no WMA, severe MR, and PA Peak Pressure of 52 mm Hg.   Marland Kitchen Chronic systolic HF (heart failure) ([place])   . CKD (chronic kidney disease)    Dr [name] ,  stage 4 ; per patient lov  in office march 2020 , per patient at this last visit he was not approved as a candidate for kidney transplant because his kidney fx has improved to greater  than 20  . Elbow pain, left   . Heart murmur   . History of kidney stones   . Hypertension   . Knee pain, bilateral   . Left ankle pain 11/04/2010   Gout  . Leg pain, bilateral   . Peripheral edema    Bilateral lower extremity   . Pneumonia    hx  . Pulmonary HTN ([place])   . S/P minimally invasive mitral valve repair 06/29/2016   30 mm [name] via right mini thoracotomy approach  . Severe mitral regurgitation   . Sleep apnea    hx bariatric  surgery 3 yrs ago lost 166 lbs  . Syncope and collapse 07/14/2016  . Venous insufficiency     Past Surgical History:  Procedure Laterality Date  . GASTRIC BYPASS  2014  . MENISCUS REPAIR Right 05/2008   right  knee  . MITRAL VALVE REPAIR Right 06/29/2016   Procedure: MINIMALLY INVASIVE MITRAL VALVE REPAIR  (MVR);  Surgeon: [name], MD;  Location: [place];  Service: Open Heart Surgery;  Laterality: Right;  . RIGHT/LEFT HEART CATH AND CORONARY ANGIOGRAPHY N/A 06/07/2016   Procedure: Right/Left Heart Cath and Coronary Angiography;  Surgeon: Larey Dresser, MD;  Location: [place] CV [facility];  Service: Cardiovascular;  Laterality: N/A;  . TEE WITHOUT CARDIOVERSION N/A 05/04/2016   Procedure: TRANSESOPHAGEAL ECHOCARDIOGRAM (TEE);  Surgeon: Larey Dresser, MD;  Location: [facility] ENDOSCOPY;  Service: Cardiovascular;  Laterality: N/A;  . TEE WITHOUT CARDIOVERSION N/A 06/29/2016   Procedure: TRANSESOPHAGEAL ECHOCARDIOGRAM (TEE);  Surgeon: [name], MD;  Location: [place];  Service: Open Heart Surgery;  Laterality: N/A;    MEDICATIONS: . albuterol (PROVENTIL HFA;VENTOLIN HFA) 108 (90 Base)  MCG/ACT inhaler  . amLODipine (NORVASC) 10 MG tablet  . aspirin EC 81 MG tablet  . calcitRIOL (ROCALTROL) 0.25 MCG capsule  . carvedilol (COREG) 25 MG tablet  . cetirizine (ZYRTEC) 10 MG tablet  . Ferrous Sulfate (IRON) 325 (65 Fe) MG TABS  . fluticasone (FLONASE) 50 MCG/ACT nasal spray  . furosemide (LASIX) 40 MG tablet  . hydrALAZINE (APRESOLINE) 100 MG tablet  . isosorbide mononitrate (IMDUR) 60 MG 24 hr tablet  . Menthol-Camphor (ICY HOT ADVANCED RELIEF) 16-11 % CREA  . Multiple Vitamin (MULTIVITAMIN WITH MINERALS) TABS tablet  . potassium chloride SA (K-DUR,KLOR-CON) 20 MEQ tablet  .  sildenafil (REVATIO) 20 MG tablet  . tamsulosin (FLOMAX) 0.4 MG CAPS capsule  . VYVANSE 60 MG capsule   . 0.9 %  sodium chloride infusion    If BP acceptable day of surgery, I anticipate pt can proceed with surgery as scheduled.  Willeen Cass, FNP-BC [facility]/Anesthesiology Phone: [phone] 07/16/2018 3:24 PM

## 2018-07-17 ENCOUNTER — Encounter (HOSPITAL_COMMUNITY)
Admission: RE | Disposition: A | Discharge status: Home / Self Care | Payer: Self-pay | Source: Home / Self Care | Attending: Urology

## 2018-07-17 ENCOUNTER — Ambulatory Visit (HOSPITAL_COMMUNITY): Payer: Medicaid Other | Admitting: Anesthesiology

## 2018-07-17 ENCOUNTER — Ambulatory Visit (HOSPITAL_COMMUNITY)
Admission: RE | Admit: 2018-07-17 | Discharge: 2018-07-17 | Disposition: A | Discharge status: Home / Self Care | Payer: Medicaid Other | Attending: Urology | Admitting: Urology

## 2018-07-17 ENCOUNTER — Ambulatory Visit (HOSPITAL_COMMUNITY): Payer: Medicaid Other

## 2018-07-17 ENCOUNTER — Ambulatory Visit (HOSPITAL_COMMUNITY): Payer: Medicaid Other | Admitting: Emergency Medicine

## 2018-07-17 ENCOUNTER — Other Ambulatory Visit: Payer: Self-pay

## 2018-07-17 ENCOUNTER — Encounter (HOSPITAL_COMMUNITY): Payer: Self-pay

## 2018-07-17 DIAGNOSIS — D631 Anemia in chronic kidney disease: Secondary | ICD-10-CM | POA: Diagnosis not present

## 2018-07-17 DIAGNOSIS — R011 Cardiac murmur, unspecified: Secondary | ICD-10-CM | POA: Diagnosis not present

## 2018-07-17 DIAGNOSIS — J45909 Unspecified asthma, uncomplicated: Secondary | ICD-10-CM | POA: Diagnosis not present

## 2018-07-17 DIAGNOSIS — N201 Calculus of ureter: Secondary | ICD-10-CM | POA: Diagnosis present

## 2018-07-17 DIAGNOSIS — G4730 Sleep apnea, unspecified: Secondary | ICD-10-CM | POA: Diagnosis not present

## 2018-07-17 DIAGNOSIS — I272 Pulmonary hypertension, unspecified: Secondary | ICD-10-CM | POA: Insufficient documentation

## 2018-07-17 DIAGNOSIS — M109 Gout, unspecified: Secondary | ICD-10-CM | POA: Insufficient documentation

## 2018-07-17 DIAGNOSIS — M1990 Unspecified osteoarthritis, unspecified site: Secondary | ICD-10-CM | POA: Diagnosis not present

## 2018-07-17 DIAGNOSIS — N210 Calculus in bladder: Secondary | ICD-10-CM | POA: Insufficient documentation

## 2018-07-17 DIAGNOSIS — I872 Venous insufficiency (chronic) (peripheral): Secondary | ICD-10-CM | POA: Insufficient documentation

## 2018-07-17 DIAGNOSIS — I130 Hypertensive heart and chronic kidney disease with heart failure and stage 1 through stage 4 chronic kidney disease, or unspecified chronic kidney disease: Secondary | ICD-10-CM | POA: Insufficient documentation

## 2018-07-17 DIAGNOSIS — I429 Cardiomyopathy, unspecified: Secondary | ICD-10-CM | POA: Insufficient documentation

## 2018-07-17 DIAGNOSIS — N183 Chronic kidney disease, stage 3 (moderate): Secondary | ICD-10-CM | POA: Insufficient documentation

## 2018-07-17 DIAGNOSIS — I5022 Chronic systolic (congestive) heart failure: Secondary | ICD-10-CM | POA: Diagnosis not present

## 2018-07-17 DIAGNOSIS — Z952 Presence of prosthetic heart valve: Secondary | ICD-10-CM | POA: Diagnosis not present

## 2018-07-17 DIAGNOSIS — Z87442 Personal history of urinary calculi: Secondary | ICD-10-CM | POA: Diagnosis not present

## 2018-07-17 HISTORY — PX: CYSTOSCOPY/URETEROSCOPY/HOLMIUM LASER/STENT PLACEMENT: SHX6546

## 2018-07-17 SURGERY — CYSTOSCOPY/URETEROSCOPY/HOLMIUM LASER/STENT PLACEMENT
Anesthesia: General | Laterality: Right

## 2018-07-17 MED ORDER — FENTANYL CITRATE (PF) 100 MCG/2ML IJ SOLN
INTRAMUSCULAR | Status: DC | PRN
  Administered 2018-07-17: 50 ug via INTRAVENOUS

## 2018-07-17 MED ORDER — TRAMADOL HCL 50 MG PO TABS: 50.0000 mg | ORAL_TABLET | Freq: Four times a day (QID) | ORAL | 0 refills | Status: AC | PRN

## 2018-07-17 MED ORDER — PROPOFOL 10 MG/ML IV BOLUS
INTRAVENOUS | Status: DC | PRN
  Administered 2018-07-17: 200 mg via INTRAVENOUS

## 2018-07-17 MED ORDER — PROPOFOL 10 MG/ML IV BOLUS
INTRAVENOUS | Status: AC
  Filled 2018-07-17: qty 20

## 2018-07-17 MED ORDER — LACTATED RINGERS IV SOLN
INTRAVENOUS | Status: DC
  Administered 2018-07-17: 10:00:00 via INTRAVENOUS

## 2018-07-17 MED ORDER — ACETAMINOPHEN 500 MG PO TABS
1000.0000 mg | ORAL_TABLET | Freq: Once | ORAL | Status: AC
  Administered 2018-07-17: 10:00:00 1000 mg via ORAL
  Filled 2018-07-17: qty 2

## 2018-07-17 MED ORDER — ONDANSETRON HCL 4 MG/2ML IJ SOLN
INTRAMUSCULAR | Status: DC | PRN
  Administered 2018-07-17: 4 mg via INTRAVENOUS

## 2018-07-17 MED ORDER — MIDAZOLAM HCL 5 MG/5ML IJ SOLN
INTRAMUSCULAR | Status: DC | PRN
  Administered 2018-07-17: 2 mg via INTRAVENOUS

## 2018-07-17 MED ORDER — SODIUM CHLORIDE 0.9 % IR SOLN
Status: DC | PRN
  Administered 2018-07-17: 3000 mL

## 2018-07-17 MED ORDER — CEFAZOLIN SODIUM-DEXTROSE 2-4 GM/100ML-% IV SOLN
2.0000 g | Freq: Once | INTRAVENOUS | Status: AC
  Administered 2018-07-17: 12:00:00 2 g via INTRAVENOUS
  Filled 2018-07-17: qty 100

## 2018-07-17 MED ORDER — DEXAMETHASONE SODIUM PHOSPHATE 10 MG/ML IJ SOLN
INTRAMUSCULAR | Status: DC | PRN
  Administered 2018-07-17: 5 mg via INTRAVENOUS

## 2018-07-17 MED ORDER — PHENYLEPHRINE 40 MCG/ML (10ML) SYRINGE FOR IV PUSH (FOR BLOOD PRESSURE SUPPORT)
PREFILLED_SYRINGE | INTRAVENOUS | Status: DC | PRN
  Administered 2018-07-17: 80 ug via INTRAVENOUS

## 2018-07-17 MED ORDER — LIDOCAINE 2% (20 MG/ML) 5 ML SYRINGE
INTRAMUSCULAR | Status: DC | PRN
  Administered 2018-07-17: 60 mg via INTRAVENOUS

## 2018-07-17 MED ORDER — FENTANYL CITRATE (PF) 100 MCG/2ML IJ SOLN: 25.0000 ug | INTRAMUSCULAR | Status: DC | PRN

## 2018-07-17 MED ORDER — MIDAZOLAM HCL 2 MG/2ML IJ SOLN
INTRAMUSCULAR | Status: AC
  Filled 2018-07-17: qty 2

## 2018-07-17 MED ORDER — SODIUM CHLORIDE 0.9 % IV SOLN
INTRAVENOUS | Status: DC | PRN
  Administered 2018-07-17: 10 mL

## 2018-07-17 MED ORDER — FENTANYL CITRATE (PF) 100 MCG/2ML IJ SOLN
INTRAMUSCULAR | Status: AC
  Filled 2018-07-17: qty 2

## 2018-07-17 MED ORDER — CEPHALEXIN 500 MG PO CAPS: 500.0000 mg | ORAL_CAPSULE | Freq: Two times a day (BID) | ORAL | 0 refills | Status: AC

## 2018-07-17 SURGICAL SUPPLY — 18 items
BAG URO CATCHER STRL LF (MISCELLANEOUS) ×2 IMPLANT
BASKET ZERO TIP NITINOL 2.4FR (BASKET) IMPLANT
CATH URET 5FR 28IN OPEN ENDED (CATHETERS) ×2 IMPLANT
CLOTH BEACON ORANGE TIMEOUT ST (SAFETY) ×2 IMPLANT
COVER WAND RF STERILE (DRAPES) IMPLANT
EXTRACTOR STONE NITINOL NGAGE (UROLOGICAL SUPPLIES) IMPLANT
FIBER LASER FLEXIVA 365 (UROLOGICAL SUPPLIES) IMPLANT
FIBER LASER TRAC TIP (UROLOGICAL SUPPLIES) IMPLANT
GLOVE BIOGEL M STRL SZ7.5 (GLOVE) ×2 IMPLANT
GOWN STRL REUS W/TWL XL LVL3 (GOWN DISPOSABLE) ×2 IMPLANT
GUIDEWIRE ZIPWRE .038 STRAIGHT (WIRE) ×2 IMPLANT
KIT TURNOVER KIT A (KITS) IMPLANT
MANIFOLD NEPTUNE II (INSTRUMENTS) ×2 IMPLANT
PACK CYSTO (CUSTOM PROCEDURE TRAY) ×2 IMPLANT
SHEATH URETERAL 12FRX35CM (MISCELLANEOUS) IMPLANT
STENT URET 6FRX26 CONTOUR (STENTS) IMPLANT
TUBING CONNECTING 10 (TUBING) ×2 IMPLANT
TUBING UROLOGY SET (TUBING) ×2 IMPLANT

## 2018-07-17 NOTE — Transfer of Care (Signed)
Immediate Anesthesia Transfer of Care Note  Patient: Vernon Blackburn  Procedure(s) Performed: CYSTOSCOPY/RETROGRADE/URETEROSCOPY/HOLMIUM LASER/STENT PLACEMENT/ CYSTOLITHALOPAXY (Right )  Patient Location: PACU  Anesthesia Type:General  Level of Consciousness: awake, alert  and oriented  Airway & Oxygen Therapy: Patient Spontanous Breathing and Patient connected to face mask oxygen  Post-op Assessment: Report given to RN and Post -op Vital signs reviewed and stable  Post vital signs: Reviewed and stable  Last Vitals:  Vitals Value Taken Time  BP 143/111 07/17/18 1236  Temp    Pulse 62 07/17/18 1237  Resp 15 07/17/18 1237  SpO2 100 % 07/17/18 1237  Vitals shown include unvalidated device data.  Last Pain:  Vitals:   07/17/18 0920  TempSrc:   PainSc: 7       Patients Stated Pain Goal: 3 (14/83/07 3543)  Complications: No apparent anesthesia complications

## 2018-07-17 NOTE — Anesthesia Procedure Notes (Signed)
Procedure Name: LMA Insertion Performed by: Miesha Bachmann J, CRNA Pre-anesthesia Checklist: Patient identified, Emergency Drugs available, Suction available, Patient being monitored and Timeout performed Patient Re-evaluated:Patient Re-evaluated prior to induction Oxygen Delivery Method: Circle system utilized Preoxygenation: Pre-oxygenation with 100% oxygen Induction Type: IV induction Ventilation: Mask ventilation without difficulty LMA: LMA inserted LMA Size: 4.0 Number of attempts: 1 Placement Confirmation: positive ETCO2 and breath sounds checked- equal and bilateral Tube secured with: Tape Dental Injury: Teeth and Oropharynx as per pre-operative assessment        

## 2018-07-17 NOTE — Op Note (Signed)
Operative Note  Preoperative diagnosis:  1.  2 mm right UVJ stone 2.  5 mm bladder stone  Postoperative diagnosis: 1.  2 mm and 5 mm bladder stones  Procedure(s): 1.  Cystoscopy with right ureteroscopy 2.  Right retrograde pyelogram with intraoperative interpretation of fluoroscopic imaging  Surgeon: Ellison Hughs, MD  Assistants:  None  Anesthesia:  General  Complications:  None  EBL: Less than 5 mL  Specimens: 1.  Bladder stones  Drains/Catheters: 1.  None  Intraoperative findings:   1. There is no evidence of any right ureteral or renal stones on flexible ureteroscopy 2. To bladder stones were evacuated from the bladder with no other intravesical pathology identified on cystoscopy 3. Solitary right collecting system with no filling defects or dilation involving the right ureter or right renal pelvis seen on retrograde pyelogram 4. Prominent median lobe of the prostate  Indication:  Vernon Blackburn is a 53 y.o. male who was seen in the emergency department on 06/25/2018 with worsening right-sided flank pain.  He had a CT of the abdomen/pelvis at that time that revealed a 2 mm obstructing right UVJ stone as well as a 5 mm bladder stone.  Despite a trial of passage, the patient continued to have pain.  He has been consented for the above procedures, voices understanding and wishes to proceed.  Description of procedure:  After informed consent was obtained, the patient was brought to the operating room and general LMA anesthesia was administered. The patient was then placed in the dorsolithotomy position and prepped and draped in the usual sterile fashion. A timeout was performed. A 23 French rigid cystoscope was then inserted into the urethral meatus and advanced into the bladder under direct vision. A complete bladder survey revealed two small bladder stones that were evacuated through the cystoscope sheath.  A 5 French ureteral catheter was then inserted into the right  ureteral orifice and a retrograde pyelogram was obtained, with the findings listed above.  A Glidewire was then used to intubate the lumen of the ureteral catheter and was advanced up to the right renal pelvis, under fluoroscopic guidance.  The catheter was then removed, leaving the wire in place.  A flexible ureteroscope was then advanced over the wire and up to the right renal pelvis.  A complete inspection of the right renal pelvis and all of its associated calyces revealed no evidence of urolithiasis.  Flexible ureteroscope was then removed under direct vision, again, identifying no evidence of a ureteral stone or intraureteral pathology.  The patient's bladder was drained.  He tolerated the procedure well and was transferred to the postanesthesia in stable condition.  Plan: Follow-up in January 2021 with a PSA

## 2018-07-17 NOTE — Anesthesia Preprocedure Evaluation (Addendum)
Anesthesia Evaluation  Patient identified by MRN, date of birth, ID band Patient awake    Reviewed: Allergy & Precautions, NPO status , Patient's Chart, lab work & pertinent test results, reviewed documented beta blocker date and time   Airway Mallampati: I  TM Distance: >3 FB Neck ROM: Full    Dental no notable dental hx. (+) Teeth Intact, Dental Advisory Given   Pulmonary asthma ,    Pulmonary exam normal breath sounds clear to auscultation       Cardiovascular hypertension, Pt. on medications and Pt. on home beta blockers +CHF  Normal cardiovascular exam+ Valvular Problems/Murmurs (s/p MV repair 2018) MR  Rhythm:Regular Rate:Normal  TTE 2019 EF 40-45%, G2DD, mild MS  Stress Test 2018 Blood pressure demonstrated a normal response to exercise. There was no ST segment deviation noted during stress. 1.  Poor exercise tolerance.  2. Submaximal study.  No evidence for ischemia by ST segment analysis but only reached 59% of MPHR.   [place] 2018 1. No significant coronary disease.  Minimal contrast used with angiography.  2. Severely elevated right and left heart filling pressures.  PCWP elevated more than LVEDP.  3. Preserved cardiac output.  4. Pulmonary venous hypertension.    Neuro/Psych negative neurological ROS  negative psych ROS   GI/Hepatic negative GI ROS, Neg liver ROS,   Endo/Other  negative endocrine ROS  Renal/GU Renal InsufficiencyRenal disease (baseline Cr 2-3)  negative genitourinary   Musculoskeletal  (+) Arthritis ,   Abdominal   Peds  Hematology  (+) Blood dyscrasia, anemia ,   Anesthesia Other Findings   Reproductive/Obstetrics                           Anesthesia Physical Anesthesia Plan  ASA: III  Anesthesia Plan: General   Post-op Pain Management:    Induction: Intravenous  PONV Risk Score and Plan: 2 and Ondansetron, Dexamethasone and Midazolam  Airway  Management Planned: LMA  Additional Equipment:   Intra-op Plan:   Post-operative Plan: Extubation in OR  Informed Consent: I have reviewed the patients History and Physical, chart, labs and discussed the procedure including the risks, benefits and alternatives for the proposed anesthesia with the patient or authorized representative who has indicated his/her understanding and acceptance.     Dental advisory given  Plan Discussed with: CRNA  Anesthesia Plan Comments:         Anesthesia Quick Evaluation

## 2018-07-17 NOTE — H&P (Signed)
Urology Preoperative H&P   Chief Complaint: Right lower quadrant pain  History of Present Illness: Vernon Blackburn is a 53 y.o. male with a 2 mm right UVJ stone as well as a 5 mm bladder stone seen on recent CT. He has an extensive cardiac history (he had a Mitral valve replacement in June 2018), stage III CKD and erectile dysfunction.    06/26/18: Patient with above noted history. He presented to the ED yesterday with acute onset right flank pain. CT imaging revealed an approximatly 2 mm calculus at the right UVJ without hydronephrosis. There was also a 5 mm bladder calculus noted and punctate nonobstructive right nephrolithiasis. Creatinine was noted to be 2.9, which is stable for him. UA showed RBC's, but was not concerning for infectious process. Pain was controlled after IV morphine and he was d/c. He returns today for follow up. He continues to complain of bothersome RLQ pain. He describes his pain is moderate and constant at this time. The pain does radiate to his groin and his testicle. He has not used anything for pain this morning. He does have some associated nausea, but denies vomiting. He was given Rx for Zofran, but has not used this. He complains of exacerbation of voiding symptoms and had nocturia X 8 last night. He does have some baseline issues with voiding including weak FOS, the sensation of incomplete emptying, and nocturia. He remains on Tamsulosin. He denies dysuria, gross hematuria, fevers, or chills. This is not his first stone, but it has been several years since prior event.   07/03/18: He returns today for follow up. He denies seeing a stone pass in the interim. He continues to complain of moderate pain, currently 6/10, of his right flank. He describes the pain as intermittent. He has been using Tylenol for pain management, which does help. He does have Vicodin at home, but does not like using this, as it causes quite a bit of sedation for him. He has been using Tamsulosin 0.8 mg  daily, which does seem to help improve his FOS. He continues to feel he incompletely empties at times. He notes hesitancy. He denies increased urgency and frequency. No hematuria or dysuria. No fevers, chills, nausea, or vomiting. He continues to have on going issues with ED. He has used Sildenafil in the past with good success. He will typically use 40 mg. No side effects reported with medication use. His cardiologist is Dr. Loralie Champagne.    Past Medical History:  Diagnosis Date  . Allergy   . Anemia, chronic disease   . Arthritis    Gout  . Asthma   . Cardiomyopathy [facility])    cardiologist is [employer]? [name], lov in office per patient was last year in 2019   . CHF (congestive heart failure) ([place])    a. 01/2016: echo showing EF of 20-25%, no WMA, severe MR, and PA Peak Pressure of 52 mm Hg.   Marland Kitchen Chronic systolic HF (heart failure) ([place])   . CKD (chronic kidney disease)    Dr [name] ,  stage 4 ; per patient lov  in office march 2020 , per patient at this last visit he was not approved as a candidate for kidney transplant because his kidney fx has improved to greater  than 20  . Elbow pain, left   . Heart murmur   . History of kidney stones   . Hypertension   . Knee pain, bilateral   . Left ankle pain 11/04/2010  Gout  . Leg pain, bilateral   . Peripheral edema    Bilateral lower extremity   . Pneumonia    hx  . Pulmonary HTN ([place])   . S/P minimally invasive mitral valve repair 06/29/2016   30 mm [name] via right mini thoracotomy approach  . Severe mitral regurgitation   . Sleep apnea    hx bariatric  surgery 3 yrs ago lost 166 lbs  . Syncope and collapse 07/14/2016  . Venous insufficiency     Past Surgical History:  Procedure Laterality Date  . GASTRIC BYPASS  2014  . MENISCUS REPAIR Right 05/2008   right  knee  . MITRAL VALVE REPAIR Right 06/29/2016   Procedure: MINIMALLY INVASIVE MITRAL VALVE REPAIR  (MVR);  Surgeon: [name], MD;   Location: [place];  Service: Open Heart Surgery;  Laterality: Right;  . RIGHT/LEFT HEART CATH AND CORONARY ANGIOGRAPHY N/A 06/07/2016   Procedure: Right/Left Heart Cath and Coronary Angiography;  Surgeon: Larey Dresser, MD;  Location: [place];  Service: Cardiovascular;  Laterality: N/A;  . TEE WITHOUT CARDIOVERSION N/A 05/04/2016   Procedure: TRANSESOPHAGEAL ECHOCARDIOGRAM (TEE);  Surgeon: Larey Dresser, MD;  Location[facility] END[facility]COPY;  Service: Cardiovascular;  Laterality: N/A;  . TEE WITHOUT CARDIOVERSION N/A 06/29/2016   Procedure: TRANSESOPHAGEAL ECHOCARDIOGRAM (TEE);  Surgeon: [name], MD;  Location: [place];  Service: Open Heart Surgery;  Laterality: N/A;    Allergies: No Known Allergies  Family History  Problem Relation Age of Onset  . Diabetes Mother   . Hypertension Mother   . Heart failure Mother   . Colon cancer Neg Hx   . Esophageal cancer Neg Hx   . Rectal cancer Neg Hx   . Stomach cancer Neg Hx     Social History:  reports that he has never smoked. He has never used smokeless tobacco. He reports current alcohol use. He reports that he does not use drugs.  ROS: A complete review of systems was performed.  All systems are negative except for pertinent findings as noted.  Physical Exam:  Vital signs in last 24 hours: Temp:  [98.6 F (37 C)] 98.6 F (37 C) (07/14 0810) Pulse Rate:  [81] 81 (07/14 0810) Resp:  [16] 16 (07/14 0810) BP: (160-176)/(121-130) 162/121 (07/14 0854) SpO2:  [100 %] 100 % (07/14 0810) Weight:  [102.1 kg] 102.1 kg (07/14 0810) Constitutional:  Alert and oriented, No acute distress Cardiovascular: Regular rate and rhythm, No JVD Respiratory: Normal respiratory effort, Lungs clear bilaterally GI: Abdomen is soft, nontender, nondistended, no abdominal masses GU: No CVA tenderness Lymphatic: No lymphadenopathy Neurologic: Grossly intact, no focal deficits Psychiatric: Normal mood and affect  Imaging:  CLINICAL DATA:   Right-sided abdominal pain with nausea, vomiting, and diarrhea.  EXAM: CT ABDOMEN AND PELVIS WITHOUT CONTRAST  TECHNIQUE: Multidetector CT imaging of the abdomen and pelvis was performed following the standard protocol without IV contrast.  COMPARISON:  CT chest dated July 21, 2016.  FINDINGS: Lower chest: No acute abnormality. Linear scarring in the right middle lobe. Calcified granuloma and focal scarring in the right lower lobe.  Hepatobiliary: 1.4 cm simple cyst in the inferior right hepatic lobe. Tiny layering gallstones. No gallbladder wall thickening or biliary dilatation.  Pancreas: Unremarkable. No pancreatic ductal dilatation or surrounding inflammatory changes.  Spleen: Normal in size without focal abnormality.  Adrenals/Urinary Tract: The adrenal glands are unremarkable. Two punctate nonobstructive calculi in the lower pole of the right kidney.  2 mm calculus at the right UVJ. No hydronephrosis. 5 mm calculus in the bladder. Mild circumferential bladder wall thickening may be related to chronic bladder outlet obstruction.  Stomach/Bowel: Prior Roux-en-Y gastric bypass surgery. Appendix appears normal. No evidence of bowel wall thickening, distention, or inflammatory changes.  Vascular/Lymphatic: No significant vascular findings are present. No enlarged abdominal or pelvic lymph nodes.  Reproductive: Mild prostatomegaly with central gland hypertrophy indenting the bladder base.  Other: Scattered trace ascites. Small right inguinal hernia containing fat and ascites. No pneumoperitoneum.  Musculoskeletal: No acute or significant osseous findings.  IMPRESSION: 1. 2 mm calculus at the right UVJ.  No hydronephrosis. 2. 5 mm bladder calculus. Punctate nonobstructive right nephrolithiasis. 3. Cholelithiasis. 4. Scattered trace ascites.   Electronically Signed   By: Titus Dubin M.D.   On: 06/25/2018 15:31  Laboratory Data:  Recent Labs     07/16/18 0908  WBC 6.3  HGB 12.8*  HCT 40.7  PLT 232    Recent Labs    07/16/18 0908  NA 142  K 4.0  CL 109  GLUCOSE 88  BUN 62*  CALCIUM 8.1*  CREATININE 3.55*     Results for orders placed or performed during the hospital encounter of 07/16/18 (from the past 24 hour(s))  Basic metabolic panel     Status: Abnormal   Collection Time: 07/16/18  9:08 AM  Result Value Ref Range   Sodium 142 135 - 145 mmol/L   Potassium 4.0 3.5 - 5.1 mmol/L   Chloride 109 98 - 111 mmol/L   CO2 22 22 - 32 mmol/L   Glucose, Bld 88 70 - 99 mg/dL   BUN 62 (H) 6 - 20 mg/dL   Creatinine, Ser 3.55 (H) 0.61 - 1.24 mg/dL   Calcium 8.1 (L) 8.9 - 10.3 mg/dL   GFR calc non Af Amer 19 (L) >60 mL/min   GFR calc Af Amer 22 (L) >60 mL/min   Anion gap 11 5 - 15  CBC     Status: Abnormal   Collection Time: 07/16/18  9:08 AM  Result Value Ref Range   WBC 6.3 4.0 - 10.5 K/uL   RBC 4.44 4.22 - 5.81 MIL/uL   Hemoglobin 12.8 (L) 13.0 - 17.0 g/dL   HCT 40.7 39.0 - 52.0 %   MCV 91.7 80.0 - 100.0 fL   MCH 28.8 26.0 - 34.0 pg   MCHC 31.4 30.0 - 36.0 g/dL   RDW 14.4 11.5 - 15.5 %   Platelets 232 150 - 400 K/uL   nRBC 0.0 0.0 - 0.2 %   Recent Results (from the past 240 hour(s))  SARS Coronavirus 2 (Performed in [facility] hospital lab)     Status: None   Collection Time: 07/13/18 12:48 PM   Specimen: Nasal Swab  Result Value Ref Range Status   SARS Coronavirus 2 NEGATIVE NEGATIVE Final    Comment: (NOTE) SARS-CoV-2 target nucleic acids are NOT DETECTED. The SARS-CoV-2 RNA is generally detectable in upper and lower respiratory specimens during the acute phase of infection. Negative results do not preclude SARS-CoV-2 infection, do not rule out co-infections with other pathogens, and should not be used as the sole basis for treatment or other patient management decisions. Negative results must be combined with clinical observations, patient history, and epidemiological information. The expected  result is Negative. Fact Sheet for Patients: SugarRoll.be Fact Sheet for Healthcare Providers: https://www.woods-mathews.com/ This test is not yet approved or cleared by the [place] FDA and  has been  authorized for detection and/or diagnosis of SARS-CoV-2 by FDA under an Emergency Use Authorization (EUA). This EUA will remain  in effect (meaning this test can be used) for the duration of the COVID-19 declaration under Section 56 4(b)(1) of the Act, 21 U.S.C. section 360bbb-3(b)(1), unless the authorization is terminated or revoked sooner. Performed at [facility], [place] [address] [place], [place] [address]     Renal Function: Recent Labs    07/16/18 0908  CREATININE 3.55*   Estimated Creatinine Clearance: 28.7 mL/min (A) (by C-G formula based on SCr of 3.55 mg/dL (H)).  Radiologic Imaging: No results found.  I independently reviewed the above imaging studies.  Assessment and Plan ODEAN FESTER is a 53 y.o. male with right UVJ calculus and ongoing right flank pain  The risks, benefits and alternatives of cystoscopy with RIGHT ureteroscopy, laser lithotripsy and ureteral stent placement was discussed the patient.  Risks included, but are not limited to: bleeding, urinary tract infection, ureteral injury/avulsion, ureteral stricture formation, retained stone fragments, the possibility that multiple surgeries may be required to treat the stone(s), MI, stroke, PE and the inherent risks of general anesthesia.  The patient voices understanding and wishes to proceed.      Vernon Hughs, MD 07/17/2018, 6:23 AM  [facility] Pager: [phone]

## 2018-07-17 NOTE — Anesthesia Postprocedure Evaluation (Signed)
Anesthesia Post Note  Patient: Vernon Blackburn  Procedure(s) Performed: CYSTOSCOPY/RETROGRADE/URETEROSCOPY/HOLMIUM LASER/STENT PLACEMENT/ CYSTOLITHALOPAXY (Right )     Patient location during evaluation: PACU Anesthesia Type: General Level of consciousness: awake and alert Pain management: pain level controlled Vital Signs Assessment: post-procedure vital signs reviewed and stable Respiratory status: spontaneous breathing, nonlabored ventilation and respiratory function stable Cardiovascular status: blood pressure returned to baseline and stable Postop Assessment: no apparent nausea or vomiting Anesthetic complications: no Comments: BP returns to baseline, which is high. No mention of BP was made to me prior to discharge from PACU.    Last Vitals:  Vitals:   07/17/18 1300 07/17/18 1315  BP:  (!) 136/103  Pulse: 65 64  Resp: 17 17  Temp:    SpO2: 96% 95%    Last Pain:  Vitals:   07/17/18 1300  TempSrc:   PainSc: 0-No pain                 Bintou Lafata,W. EDMOND

## 2018-07-17 NOTE — OR Nursing (Signed)
Stones taken with Dr. Lovena Neighbours

## 2018-07-17 NOTE — Discharge Instructions (Signed)

## 2018-07-18 ENCOUNTER — Encounter (HOSPITAL_COMMUNITY): Payer: Self-pay | Admitting: Urology

## 2018-07-30 ENCOUNTER — Telehealth (HOSPITAL_COMMUNITY): Payer: Self-pay

## 2018-07-30 NOTE — Telephone Encounter (Signed)
Faxed records requested from [facility] for patient. Faxed to [phone].

## 2018-07-31 ENCOUNTER — Ambulatory Visit (HOSPITAL_COMMUNITY)
Admission: RE | Admit: 2018-07-31 | Discharge: 2018-07-31 | Disposition: A | Discharge status: Home / Self Care | Payer: Medicaid Other | Source: Ambulatory Visit | Attending: Cardiology | Admitting: Cardiology

## 2018-07-31 ENCOUNTER — Encounter (HOSPITAL_COMMUNITY): Payer: Self-pay | Admitting: Cardiology

## 2018-07-31 ENCOUNTER — Other Ambulatory Visit: Payer: Self-pay

## 2018-07-31 VITALS — BP 171/128 | HR 83 | Wt 224.4 lb

## 2018-07-31 DIAGNOSIS — I420 Dilated cardiomyopathy: Secondary | ICD-10-CM | POA: Diagnosis not present

## 2018-07-31 DIAGNOSIS — M109 Gout, unspecified: Secondary | ICD-10-CM | POA: Insufficient documentation

## 2018-07-31 DIAGNOSIS — Z9889 Other specified postprocedural states: Secondary | ICD-10-CM | POA: Diagnosis not present

## 2018-07-31 DIAGNOSIS — N184 Chronic kidney disease, stage 4 (severe): Secondary | ICD-10-CM | POA: Diagnosis not present

## 2018-07-31 DIAGNOSIS — Z8249 Family history of ischemic heart disease and other diseases of the circulatory system: Secondary | ICD-10-CM | POA: Diagnosis not present

## 2018-07-31 DIAGNOSIS — J45909 Unspecified asthma, uncomplicated: Secondary | ICD-10-CM | POA: Diagnosis not present

## 2018-07-31 DIAGNOSIS — I130 Hypertensive heart and chronic kidney disease with heart failure and stage 1 through stage 4 chronic kidney disease, or unspecified chronic kidney disease: Secondary | ICD-10-CM | POA: Diagnosis not present

## 2018-07-31 DIAGNOSIS — Z7982 Long term (current) use of aspirin: Secondary | ICD-10-CM | POA: Diagnosis not present

## 2018-07-31 DIAGNOSIS — Z79899 Other long term (current) drug therapy: Secondary | ICD-10-CM | POA: Insufficient documentation

## 2018-07-31 DIAGNOSIS — I5022 Chronic systolic (congestive) heart failure: Secondary | ICD-10-CM | POA: Diagnosis present

## 2018-07-31 DIAGNOSIS — I340 Nonrheumatic mitral (valve) insufficiency: Secondary | ICD-10-CM | POA: Diagnosis not present

## 2018-07-31 MED ORDER — HYDRALAZINE HCL 100 MG PO TABS: 100.0000 mg | ORAL_TABLET | Freq: Three times a day (TID) | ORAL | 3 refills | Status: DC

## 2018-07-31 MED ORDER — FUROSEMIDE 40 MG PO TABS: 40.0000 mg | ORAL_TABLET | Freq: Two times a day (BID) | ORAL | 6 refills | Status: DC

## 2018-07-31 NOTE — Progress Notes (Signed)
ReDS Vest - 07/31/18 1500      ReDS Vest   Estimated volume prior to reading  High    Fitting Posture  Sitting    Height Marker  Tall    Ruler Value  [place]  Aligned    ReDS Value  46    Anatomical Comments  Station D

## 2018-07-31 NOTE — Progress Notes (Signed)
PCP: Dr. Delena Bali HF Cardiology: [name]  53 y.o. with history of long-standing, poorly-controlled HTN, CKD stage 3-4, chronic systolic CHF and severe mitral regurgitation presents for followup of CHF and HTN.  He has had hypertension for years.  He has had known CKD, followed by Dr. [name].  He developed severe dyspnea in 12/17 and was admitted with acute systolic CHF.  Echo showed EF 20-25% with severe MR.  He was diuresed and discharged.   TEE was done in 5/18 to assess mitral valve.  There was severe MR.  The MV was thickened and did not coapt well.  Suspect there was a component of secondary MR with moderate LV dilation, however the thickened leaflets suggested a component of primary MR as well.   He had right and left heart cath in 6/18.  No significant CAD, elevated filling pressures.    In 6/18, he had mitral valve repair.  Post-op echo in 7/18 showed EF 25-30% with stable MV repair (mild MR, mean gradient 4 mmHg).  Echo 12/18 with EF 30-35%, moderate LV dilation, s/p MVR repair with trivial MR.  Echo in 4/19 showed EF up to 40-45% with stable repaired mitral valve.   Spironolactone stopped with elevated creatinine.   I have not seen him in about a year.  His weight is up 6 lbs.  BP is very high and has been high at home.  He has been under a lot of stress, quit his job at [employer] office. He has generalized fatigue.  He is short of breath with any incline and with a long walk.  Dyspnea has been worse now for several months. He has 3-pillow orthopnea.    ECG (personally reviewed): NSR, normal  REDS clip 46%  Labs (4/18): K 3.7, creatinine 3.05, hgb 10.4 Labs (5/18): K 3.3, creatinine 2.89, BNP 789 Labs (6/18): K 3.4, creatinine 2.4, hgb 10.1 Labs (7/18): K 3.2, creatinine 2.27, BNP 303 Labs (9/18): LDL 78, HDL 56, K 3.8, creatinine 2.16 Labs (10/18): hgb 10.1 Labs (2/19): K 3.3, creatinine 2.75, hgb 12.1 Labs (5/19): K 3.6, creatinine 3.07 Labs (7/20): K 4,  creatinine 3.55, hgb 12.8  PMH: 1. Gout 2. HTN: Long-standing, poor control. 3. CKD: Stage 3-4, followed by Dr. [name].  Likely hypertensive nephropathy.  4. Dilated cardiomyopathy: May be due to long-standing HTN.  - Echo (1/18): EF 20-25%, severe MR - Echo (2/18): EF 25%, severe MR.  - TEE (5/18): Moderate LV dilation with severe global hypokinesis, EF 25-30%, normal RV size with mildly decreased systolic function, RV-RA gradient 55 mmHg, severe MR with mildly thickened leaflets with inadequate coaptation and ERO 0.52 cm^2 by PISA and systolic flow reversal in the pulmonary vein doppler pattern => secondary MR from dilated LV but thickened leaflets lead to concern for component of primary MR as well.  - RHC/LHC (6/18): No significant CAD.  Mean RA 15, PA 76/31 mean 48, mean PCWP 45, CI 3.02 Fick, 2.64 thermo.  - Echo (7/18): EF 25-30%, mild dilation, diffuse hypokinesis, s/p MV repair with mean gradient 4 mmHg and mild MR, PASP 37 mmHg, normal RV size and systolic function.  - Echo (12/18): EF 30-35%, moderate LV dilation with diffuse hypokinesis, stable MV repair.  - Echo (4/19): EF 40-45%, severe LV dilation, s/p mitral valve repair with mean gradient 4 mmHg, no mitral regurgitation, severe RV dilation with normal systolic function.  5. Severe MR: See TEE report above.  - S/p MV repair in 6/18.  SH: Married,  lives in [place], nonsmoker, no drugs, occasional ETOH.  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   Family History  Problem Relation Age of Onset  . Diabetes Mother   . Hypertension Mother   . Heart failure Mother   . Colon cancer Neg Hx   . Esophageal cancer Neg Hx   . Rectal cancer Neg Hx   . Stomach cancer Neg Hx    ROS: All systems reviewed and negative except as per HPI.   Current Outpatient Medications  Medication Sig Dispense Refill  . acetaminophen (TYLENOL) 325 MG tablet Take 650 mg by mouth every 6 (six) hours as needed. Took 2 tabs for  headache yesterday 07/16/2018    . albuterol (PROVENTIL HFA;VENTOLIN HFA) 108 (90 Base) MCG/ACT inhaler Inhale 2 puffs into the lungs every 6 (six) hours as needed for wheezing or shortness of breath. 1 Inhaler 2  . amLODipine (NORVASC) 10 MG tablet TAKE 1 TABLET BY MOUTH ONCE DAILY (Patient taking differently: Take 10 mg by mouth daily. ) 30 tablet 5  . aspirin EC 81 MG tablet Take 1 tablet (81 mg total) by mouth daily. 90 tablet 3  . calcitRIOL (ROCALTROL) 0.25 MCG capsule Take 0.25 mcg by mouth daily.    . carvedilol (COREG) 25 MG tablet Take 1 tablet (25 mg total) by mouth 2 (two) times daily. Please schedule an appointment for further refills 60 tablet 0  . cetirizine (ZYRTEC) 10 MG tablet Take 10 mg by mouth daily.    . Ferrous Sulfate (IRON) 325 (65 Fe) MG TABS Take 325 mg by mouth 3 (three) times daily.    . fluticasone (FLONASE) 50 MCG/ACT nasal spray Place 1 spray into both nostrils 2 (two) times daily as needed for allergies or rhinitis.    . furosemide (LASIX) 40 MG tablet Take 1 tablet (40 mg total) by mouth 2 (two) times daily. Please cancel all previous orders for current medication. Change in dosage or pill size. 70 tablet 6  . hydrALAZINE (APRESOLINE) 100 MG tablet Take 1 tablet (100 mg total) by mouth 3 (three) times daily. 270 tablet 3  . isosorbide mononitrate (IMDUR) 60 MG 24 hr tablet Take 60 mg by mouth daily.    . Multiple Vitamin (MULTIVITAMIN WITH MINERALS) TABS tablet Take 1 tablet by mouth 2 (two) times daily.     . potassium chloride SA (K-DUR,KLOR-CON) 20 MEQ tablet TAKE 2 TABLETS BY MOUTH ONCE DAILY (Patient taking differently: Take 40 mEq by mouth daily. ) 180 tablet 1  . sildenafil (REVATIO) 20 MG tablet Take 20 mg by mouth as needed.    . tamsulosin (FLOMAX) 0.4 MG CAPS capsule Take 0.4 mg by mouth daily.    Marland Kitchen VYVANSE 60 MG capsule Take 60 mg by mouth daily.   0   Current Facility-Administered Medications  Medication Dose Route Frequency Provider Last Rate Last  Dose  . 0.9 %  sodium chloride infusion  500 mL Intravenous Once Cirigliano, Vito V, DO       BP (!) 171/128   Pulse 83   Wt 101.8 kg (224 lb 6.4 oz)   SpO2 98%   BMI 33.14 kg/m  General: NAD Neck: JVP 8-9 cm, no thyromegaly or thyroid nodule.  Lungs: Clear to auscultation bilaterally with normal respiratory effort. CV: Nondisplaced PMI.  Heart regular S1/S2, no S3/S4, 2/6 HSM LLSB.  No peripheral edema.  No carotid bruit.  Normal pedal pulses.  Abdomen: Soft, nontender, no hepatosplenomegaly, no distention.  Skin: Intact  without lesions or rashes.  Neurologic: Alert and oriented x 3.  Psych: Normal affect. Extremities: No clubbing or cyanosis.  HEENT: Normal.   Assessment/Plan: 1. HTN: Possible hypertensive cardiomyopathy and hypertensive nephropathy.  BP is very high today, needs to be controlled.  - Increase hydralazine to 100 mg tid.  - Continue amlodipine 10 mg daily and Coreg 25 mg bid.  2. CKD: Stage 4.  Followed by nephrology. BMET today.   3. Chronic systolic CHF: Echo in 6/26 with EF 25-30%. Nonischemic cardiomyopathy based on cath in 6/18.  Cause of cardiomyopathy may be long-standing, poorly controlled HTN.  NYHA class II symptoms.  He was initially improved symptomatically s/p MV repair.  Last echo in 4/19 with EF 40-45%, improved. Recently, increased dyspnea (NYHA class III) with volume overload on exam and by REDS clip.  - Increase Lasix to 120 qam/80 qpm x 2 days then 80 mg bid (has been taking 80 qam/40 qpm at home).  BMET in 10 days.    - Increase hydralazine to 100 mg tid and continue Imdur 60 mg daily.    - Continue Coreg 25 mg bid.  - Holding off on ARB/ACEI/ARNI with elevated creatinine.  - Spironolactone was stopped with elevated creatinine.  - EF has been out of ICD range.  - I will obtain echo to reassess EF.  4. Mitral regurgitation: s/p MV repair for severe MR.  Much improved s/p surgery. Stable MV repair on 4/19 echo.  - Will need abx with dental work  given prosthetic material.  - Repeat echo as above to reassess MV.  5. Asthma: Occasional wheezing, not exertional.    Followup in 3 wks.   Loralie Champagne 07/31/2018

## 2018-07-31 NOTE — Patient Instructions (Addendum)
INCREASE Hydralazine to 100mg  (1 tab) three times a day  TAKE Lasix 120mg  (3 tabs) in the morning AND 80mg  (2 tabs) in the evening FOR 2 DAYS.  THEN resume Lasix 80mg  (2 tabs) twice a day.  Your physician has requested that you have an echocardiogram. Echocardiography is a painless test that uses sound waves to create images of your heart. It provides your doctor with information about the size and shape of your heart and how well your heart's chambers and valves are working. This procedure takes approximately one hour. There are no restrictions for this procedure.   Your physician recommends that you schedule a follow-up appointment in: 3-4 weeks with Nurse Practitioner  At the [facility], you and your health needs are our priority. As part of our continuing mission to provide you with exceptional heart care, we have created designated Provider Care Teams. These Care Teams include your primary Cardiologist (physician) and Advanced Practice Providers (APPs- Physician Assistants and Nurse Practitioners) who all work together to provide you with the care you need, when you need it.   You may see any of the following providers on your designated Care Team at your next follow up: Marland Kitchen Dr Glori Bickers . Dr Loralie Champagne . Darrick Grinder, NP

## 2018-08-01 ENCOUNTER — Telehealth (HOSPITAL_COMMUNITY): Payer: Self-pay

## 2018-08-01 NOTE — Telephone Encounter (Signed)
Medical records from 06/12/2017-current faxed to [facility] per request with pt signature

## 2018-08-08 ENCOUNTER — Other Ambulatory Visit: Payer: Self-pay

## 2018-08-08 ENCOUNTER — Ambulatory Visit (HOSPITAL_COMMUNITY)
Admission: RE | Admit: 2018-08-08 | Discharge: 2018-08-08 | Disposition: A | Discharge status: Home / Self Care | Payer: Medicaid Other | Source: Ambulatory Visit | Attending: Cardiology | Admitting: Cardiology

## 2018-08-08 DIAGNOSIS — I272 Pulmonary hypertension, unspecified: Secondary | ICD-10-CM | POA: Diagnosis not present

## 2018-08-08 DIAGNOSIS — I429 Cardiomyopathy, unspecified: Secondary | ICD-10-CM | POA: Diagnosis not present

## 2018-08-08 DIAGNOSIS — I5022 Chronic systolic (congestive) heart failure: Secondary | ICD-10-CM | POA: Diagnosis not present

## 2018-08-08 NOTE — Progress Notes (Signed)
  Echocardiogram 2D Echocardiogram has been performed.  Vernon Blackburn 08/08/2018, 8:35 AM

## 2018-08-12 ENCOUNTER — Telehealth (HOSPITAL_COMMUNITY): Payer: Self-pay

## 2018-08-12 NOTE — Telephone Encounter (Signed)
Pt aware of results. Pt has f/u with NP on 8/27 and will discuss more should he need to have any med changes. Verbalized understanding.

## 2018-08-12 NOTE — Telephone Encounter (Signed)
-----   Message from Larey Dresser, MD sent at 08/08/2018  9:56 PM EDT ----- The repaired mitral valve looks ok.  EF low at 35%.  Needs close followup in clinic.

## 2018-08-23 ENCOUNTER — Other Ambulatory Visit: Payer: Self-pay | Admitting: Vascular Surgery

## 2018-08-23 DIAGNOSIS — N184 Chronic kidney disease, stage 4 (severe): Secondary | ICD-10-CM

## 2018-08-23 DIAGNOSIS — N179 Acute kidney failure, unspecified: Secondary | ICD-10-CM

## 2018-08-29 ENCOUNTER — Encounter (HOSPITAL_COMMUNITY): Payer: Medicaid Other

## 2018-09-18 ENCOUNTER — Other Ambulatory Visit (HOSPITAL_COMMUNITY): Payer: Medicaid Other

## 2018-09-18 ENCOUNTER — Encounter (HOSPITAL_COMMUNITY): Payer: Medicaid Other

## 2018-09-18 ENCOUNTER — Encounter: Payer: Medicaid Other | Admitting: Vascular Surgery

## 2018-09-25 ENCOUNTER — Other Ambulatory Visit: Payer: Self-pay | Admitting: Nephrology

## 2018-09-25 DIAGNOSIS — N184 Chronic kidney disease, stage 4 (severe): Secondary | ICD-10-CM

## 2018-09-29 ENCOUNTER — Other Ambulatory Visit (HOSPITAL_COMMUNITY): Payer: Self-pay | Admitting: Cardiology

## 2018-09-29 DIAGNOSIS — I10 Essential (primary) hypertension: Secondary | ICD-10-CM

## 2018-09-30 ENCOUNTER — Ambulatory Visit
Admission: RE | Admit: 2018-09-30 | Discharge: 2018-09-30 | Disposition: A | Discharge status: Home / Self Care | Payer: Medicaid Other | Source: Ambulatory Visit | Attending: Nephrology | Admitting: Nephrology

## 2018-09-30 ENCOUNTER — Other Ambulatory Visit (HOSPITAL_COMMUNITY): Payer: Self-pay

## 2018-09-30 DIAGNOSIS — N184 Chronic kidney disease, stage 4 (severe): Secondary | ICD-10-CM

## 2018-09-30 MED ORDER — POTASSIUM CHLORIDE CRYS ER 20 MEQ PO TBCR: 40.0000 meq | EXTENDED_RELEASE_TABLET | Freq: Every day | ORAL | 1 refills | Status: DC

## 2018-10-01 ENCOUNTER — Other Ambulatory Visit (HOSPITAL_COMMUNITY): Payer: Self-pay

## 2018-10-01 DIAGNOSIS — I10 Essential (primary) hypertension: Secondary | ICD-10-CM

## 2018-10-01 MED ORDER — CARVEDILOL 25 MG PO TABS: ORAL_TABLET | ORAL | 0 refills | Status: DC

## 2018-10-03 ENCOUNTER — Other Ambulatory Visit (HOSPITAL_COMMUNITY): Payer: Self-pay | Admitting: Nephrology

## 2018-10-25 ENCOUNTER — Other Ambulatory Visit: Payer: Self-pay

## 2018-10-25 DIAGNOSIS — N184 Chronic kidney disease, stage 4 (severe): Secondary | ICD-10-CM

## 2018-10-28 ENCOUNTER — Telehealth (HOSPITAL_COMMUNITY): Payer: Self-pay

## 2018-10-28 NOTE — Telephone Encounter (Signed)

## 2018-10-29 ENCOUNTER — Ambulatory Visit (HOSPITAL_COMMUNITY)
Admission: RE | Admit: 2018-10-29 | Discharge: 2018-10-29 | Disposition: A | Discharge status: Home / Self Care | Payer: Medicaid Other | Source: Ambulatory Visit | Attending: Family | Admitting: Family

## 2018-10-29 ENCOUNTER — Ambulatory Visit (INDEPENDENT_AMBULATORY_CARE_PROVIDER_SITE_OTHER): Payer: Medicaid Other | Admitting: Vascular Surgery

## 2018-10-29 ENCOUNTER — Encounter: Payer: Self-pay | Admitting: Vascular Surgery

## 2018-10-29 ENCOUNTER — Other Ambulatory Visit: Payer: Self-pay

## 2018-10-29 ENCOUNTER — Ambulatory Visit (INDEPENDENT_AMBULATORY_CARE_PROVIDER_SITE_OTHER)
Admission: RE | Admit: 2018-10-29 | Discharge: 2018-10-29 | Disposition: A | Discharge status: Home / Self Care | Payer: Medicaid Other | Source: Ambulatory Visit | Attending: Family | Admitting: Family

## 2018-10-29 VITALS — BP 132/90 | HR 73 | Temp 97.8°F | Resp 16 | Ht 69.0 in | Wt 215.0 lb

## 2018-10-29 DIAGNOSIS — N184 Chronic kidney disease, stage 4 (severe): Secondary | ICD-10-CM

## 2018-10-29 NOTE — Progress Notes (Signed)
Patient name: Vernon Blackburn MRN: [id number] DOB: 03-Aug-1965 Sex: male  REASON FOR CONSULT: New AV access evaluation for dialysis  HPI: Vernon Blackburn is a 53 y.o. male, with stage IV chronic kidney disease, chronic systolic heart failure, hypertension, mitral regurgitation that presents for new AV fistula access evaluation.  Patient states that his kidney disease is likely from hypertension and congestive heart failure.  He would like to have a kidney transplant.  He has never had any access in his upper extremities before.  He is right-handed.  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  No chest wall implants otherwise.  Past Medical History:  Diagnosis Date  . Allergy   . Anemia, chronic disease   . Arthritis    Gout  . Asthma   . Cardiomyopathy [facility])    cardiologist is [employer]? [name], lov in office per patient was last year in 2019   . CHF (congestive heart failure) ([place])    a. 01/2016: echo showing EF of 20-25%, no WMA, severe MR, and PA Peak Pressure of 52 mm Hg.   Marland Kitchen Chronic systolic HF (heart failure) ([place])   . CKD (chronic kidney disease)    Dr [name] ,  stage 4 ; per patient lov  in office march 2020 , per patient at this last visit he was not approved as a candidate for kidney transplant because his kidney fx has improved to greater  than 20  . Elbow pain, left   . Heart murmur   . History of kidney stones   . Hypertension   . Knee pain, bilateral   . Left ankle pain 11/04/2010   Gout  . Leg pain, bilateral   . Peripheral edema    Bilateral lower extremity   . Pneumonia    hx  . Pulmonary HTN ([place])   . S/P minimally invasive mitral valve repair 06/29/2016   30 mm [name] via right mini thoracotomy approach  . Severe mitral regurgitation   . Sleep apnea    hx bariatric  surgery 3 yrs ago lost 166 lbs  . Syncope and collapse 07/14/2016  . Venous insufficiency     Past Surgical History:  Procedure Laterality Date  .  CYSTOSCOPY/URETEROSCOPY/HOLMIUM LASER/STENT PLACEMENT Right 07/17/2018   Procedure: CYSTOSCOPY/RETROGRADE/URETEROSCOPY/HOLMIUM LASER/STENT PLACEMENT/ CYSTOLITHALOPAXY;  Surgeon: Ceasar Mons, MD;  Location: [facility];  Service: Urology;  Laterality: Right;  . GASTRIC BYPASS  2014  . MENISCUS REPAIR Right 05/2008   right  knee  . MITRAL VALVE REPAIR Right 06/29/2016   Procedure: MINIMALLY INVASIVE MITRAL VALVE REPAIR  (MVR);  Surgeon: [name], MD;  Location: [facility];  Service: Open Heart Surgery;  Laterality: Right;  . RIGHT/LEFT HEART CATH AND CORONARY ANGIOGRAPHY N/A 06/07/2016   Procedure: Right/Left Heart Cath and Coronary Angiography;  Surgeon: Larey Dresser, MD;  Location: [facility];  Service: Cardiovascular;  Laterality: N/A;  . TEE WITHOUT CARDIOVERSION N/A 05/04/2016   Procedure: TRANSESOPHAGEAL ECHOCARDIOGRAM (TEE);  Surgeon: Larey Dresser, MD;  Location: [facility] ENDOSCOPY;  Service: Cardiovascular;  Laterality: N/A;  . TEE WITHOUT CARDIOVERSION N/A 06/29/2016   Procedure: TRANSESOPHAGEAL ECHOCARDIOGRAM (TEE);  Surgeon: [name], MD;  Location: [facility];  Service: Open Heart Surgery;  Laterality: N/A;    Family History  Problem Relation Age of Onset  . Diabetes Mother   . Hypertension Mother   . Heart failure Mother   . Colon cancer Neg Hx   .  Esophageal cancer Neg Hx   . Rectal cancer Neg Hx   . Stomach cancer Neg Hx     SOCIAL HISTORY: Social History   Socioeconomic History  . Marital status: Married    Spouse name: Not on file  . Number of children: Not on file  . Years of education: Not on file  . Highest education level: Not on file  Occupational History  . Not on file  Social Needs  . Financial resource strain: Not on file  . Food insecurity    Worry: Not on file    Inability: Not on file  . Transportation needs    Medical: Not on file    Non-medical: Not on file  Tobacco Use  . Smoking status: Never Smoker  . Smokeless tobacco:  Never Used  Substance and Sexual Activity  . Alcohol use: Yes    Comment: Once a month   . Drug use: No  . Sexual activity: Not on file  Lifestyle  . Physical activity    Days per week: Not on file    Minutes per session: Not on file  . Stress: Not on file  Relationships  . Social Herbalist on phone: Not on file    Gets together: Not on file    Attends religious service: Not on file    Active member of club or organization: Not on file    Attends meetings of clubs or organizations: Not on file    Relationship status: Not on file  . Intimate partner violence    Fear of current or ex partner: Not on file    Emotionally abused: Not on file    Physically abused: Not on file    Forced sexual activity: Not on file  Other Topics Concern  . Not on file  Social History Narrative  . Not on file    No Known Allergies  Current Outpatient Medications  Medication Sig Dispense Refill  . acetaminophen (TYLENOL) 325 MG tablet Take 650 mg by mouth every 6 (six) hours as needed. Took 2 tabs for headache yesterday 07/16/2018    . albuterol (PROVENTIL HFA;VENTOLIN HFA) 108 (90 Base) MCG/ACT inhaler Inhale 2 puffs into the lungs every 6 (six) hours as needed for wheezing or shortness of breath. 1 Inhaler 2  . amLODipine (NORVASC) 10 MG tablet TAKE 1 TABLET BY MOUTH ONCE DAILY (Patient taking differently: Take 10 mg by mouth daily. ) 30 tablet 5  . aspirin EC 81 MG tablet Take 1 tablet (81 mg total) by mouth daily. 90 tablet 3  . calcitRIOL (ROCALTROL) 0.25 MCG capsule Take 0.25 mcg by mouth daily.    . carvedilol (COREG) 25 MG tablet TAKE 1 TABLET BY MOUTH TWICE DAILY *NEED  TO  MAKE  AN  APPOINTMENT* 60 tablet 0  . cetirizine (ZYRTEC) 10 MG tablet Take 10 mg by mouth daily.    . Ferrous Sulfate (IRON) 325 (65 Fe) MG TABS Take 325 mg by mouth 3 (three) times daily.    . fluticasone (FLONASE) 50 MCG/ACT nasal spray Place 1 spray into both nostrils 2 (two) times daily as needed for  allergies or rhinitis.    . furosemide (LASIX) 40 MG tablet Take 1 tablet (40 mg total) by mouth 2 (two) times daily. Please cancel all previous orders for current medication. Change in dosage or pill size. 70 tablet 6  . hydrALAZINE (APRESOLINE) 100 MG tablet Take 1 tablet (100 mg total) by mouth 3 (three) times  daily. 270 tablet 3  . isosorbide mononitrate (IMDUR) 60 MG 24 hr tablet Take 60 mg by mouth daily.    . Multiple Vitamin (MULTIVITAMIN WITH MINERALS) TABS tablet Take 1 tablet by mouth 2 (two) times daily.     . potassium chloride SA (K-DUR) 20 MEQ tablet Take 2 tablets (40 mEq total) by mouth daily. 180 tablet 1  . sildenafil (REVATIO) 20 MG tablet Take 20 mg by mouth as needed.    . tamsulosin (FLOMAX) 0.4 MG CAPS capsule Take 0.4 mg by mouth daily.    Marland Kitchen VYVANSE 60 MG capsule Take 60 mg by mouth daily.   0   Current Facility-Administered Medications  Medication Dose Route Frequency Provider Last Rate Last Dose  . 0.9 %  sodium chloride infusion  500 mL Intravenous Once Cirigliano, Vito V, DO        REVIEW OF SYSTEMS:  [X]  denotes positive finding, [ ]  denotes negative finding Cardiac  Comments:  Chest pain or chest pressure:    Shortness of breath upon exertion: x   Short of breath when lying flat:    Irregular heart rhythm:        Vascular    Pain in calf, thigh, or hip brought on by ambulation:    Pain in feet at night that wakes you up from your sleep:     Blood clot in your veins:    Leg swelling:         Pulmonary    Oxygen at home:    Productive cough:     Wheezing:  x       Neurologic    Sudden weakness in arms or legs:     Sudden numbness in arms or legs:     Sudden onset of difficulty speaking or slurred speech:    Temporary loss of vision in one eye:     Problems with dizziness:         Gastrointestinal    Blood in stool:     Vomited blood:         Genitourinary    Burning when urinating:     Blood in urine:        Psychiatric    Major  depression:         Hematologic    Bleeding problems:    Problems with blood clotting too easily:        Skin    Rashes or ulcers:        Constitutional    Fever or chills:      PHYSICAL EXAM: Vitals:   10/29/18 0943  BP: 132/90  Pulse: 73  Resp: 16  Temp: 97.8 F (36.6 C)  TempSrc: Temporal  SpO2: 99%  Weight: 215 lb (97.5 kg)  Height: 5\' 9"  (1.753 m)    GENERAL: The patient is a well-nourished male, in no acute distress. The vital signs are documented above. CARDIAC: There is a regular rate and rhythm.  VASCULAR:  2+ palpable radial pulse bilateral upper extremity 2+ palpable brachial pulse bilateral upper extremity PULMONARY: There is good air exchange bilaterally without wheezing or rales. ABDOMEN: Soft and non-tender with normal pitched bowel sounds.  MUSCULOSKELETAL: There are no major deformities or cyanosis. NEUROLOGIC: No focal weakness or paresthesias are detected. SKIN: There are no ulcers or rashes noted. PSYCHIATRIC: The patient has a normal affect.  DATA:   Vein mapping shows small surface veins bilaterally  Assessment/Plan:  53 year old male with stage IV chronic kidney disease secondary  to hypertension and congestive heart failure presents for new AV fistula access evaluation.  Unfortunately he has no usable superficial surface veins based on vein mapping.  It looks like tentatively he would be a candidate for graft in his left arm.  The nephrology referral states waiting on an AV graft at this time.  I discussed access surgery in detail with the patient including risk and benefits.  Discussed that either he or his nephrology office give [place] a call when he is ready to proceed with AV graft placement.  Certainly will reevaluate his veins in the OR with ultrasound to ensure that there are no other AV fistula options but likely graft based on vein mapping today.Marty Heck, MD [facility] Office:  [phone] Pager: [phone]

## 2018-11-15 ENCOUNTER — Encounter: Payer: Self-pay | Admitting: Gastroenterology

## 2019-01-05 ENCOUNTER — Encounter (HOSPITAL_COMMUNITY): Payer: Self-pay | Admitting: Emergency Medicine

## 2019-01-05 ENCOUNTER — Other Ambulatory Visit: Payer: Self-pay

## 2019-01-05 ENCOUNTER — Emergency Department (HOSPITAL_COMMUNITY)
Admission: EM | Admit: 2019-01-05 | Discharge: 2019-01-05 | Disposition: A | Discharge status: Home / Self Care | Payer: Medicaid Other | Attending: Emergency Medicine | Admitting: Emergency Medicine

## 2019-01-05 DIAGNOSIS — Z5321 Procedure and treatment not carried out due to patient leaving prior to being seen by health care provider: Secondary | ICD-10-CM | POA: Diagnosis not present

## 2019-01-05 DIAGNOSIS — R1031 Right lower quadrant pain: Secondary | ICD-10-CM | POA: Diagnosis present

## 2019-01-05 DIAGNOSIS — R300 Dysuria: Secondary | ICD-10-CM | POA: Diagnosis not present

## 2019-01-05 LAB — URINALYSIS, ROUTINE W REFLEX MICROSCOPIC
Bacteria, UA: NONE SEEN
Bilirubin Urine: NEGATIVE
Glucose, UA: NEGATIVE mg/dL
Ketones, ur: NEGATIVE mg/dL
Leukocytes,Ua: NEGATIVE
Nitrite: NEGATIVE
Protein, ur: 100 mg/dL — AB
Specific Gravity, Urine: 1.009 (ref 1.005–1.030)
pH: 5 (ref 5.0–8.0)

## 2019-01-05 NOTE — ED Notes (Signed)
Pt  Called for blood draw with no response

## 2019-01-05 NOTE — ED Triage Notes (Signed)
Per pt, states right flank pain that started a couple of days ago-states dysuria-history of kidney stones

## 2019-01-29 IMAGING — CR DG CHEST 2V
2 series · 2 of 2 positions shown · non-contrast
Comparison: 03/03/2016

CLINICAL DATA: Preoperative workup for mitral valve surgery

EXAM:
CHEST  2 VIEW

[w chest pa]
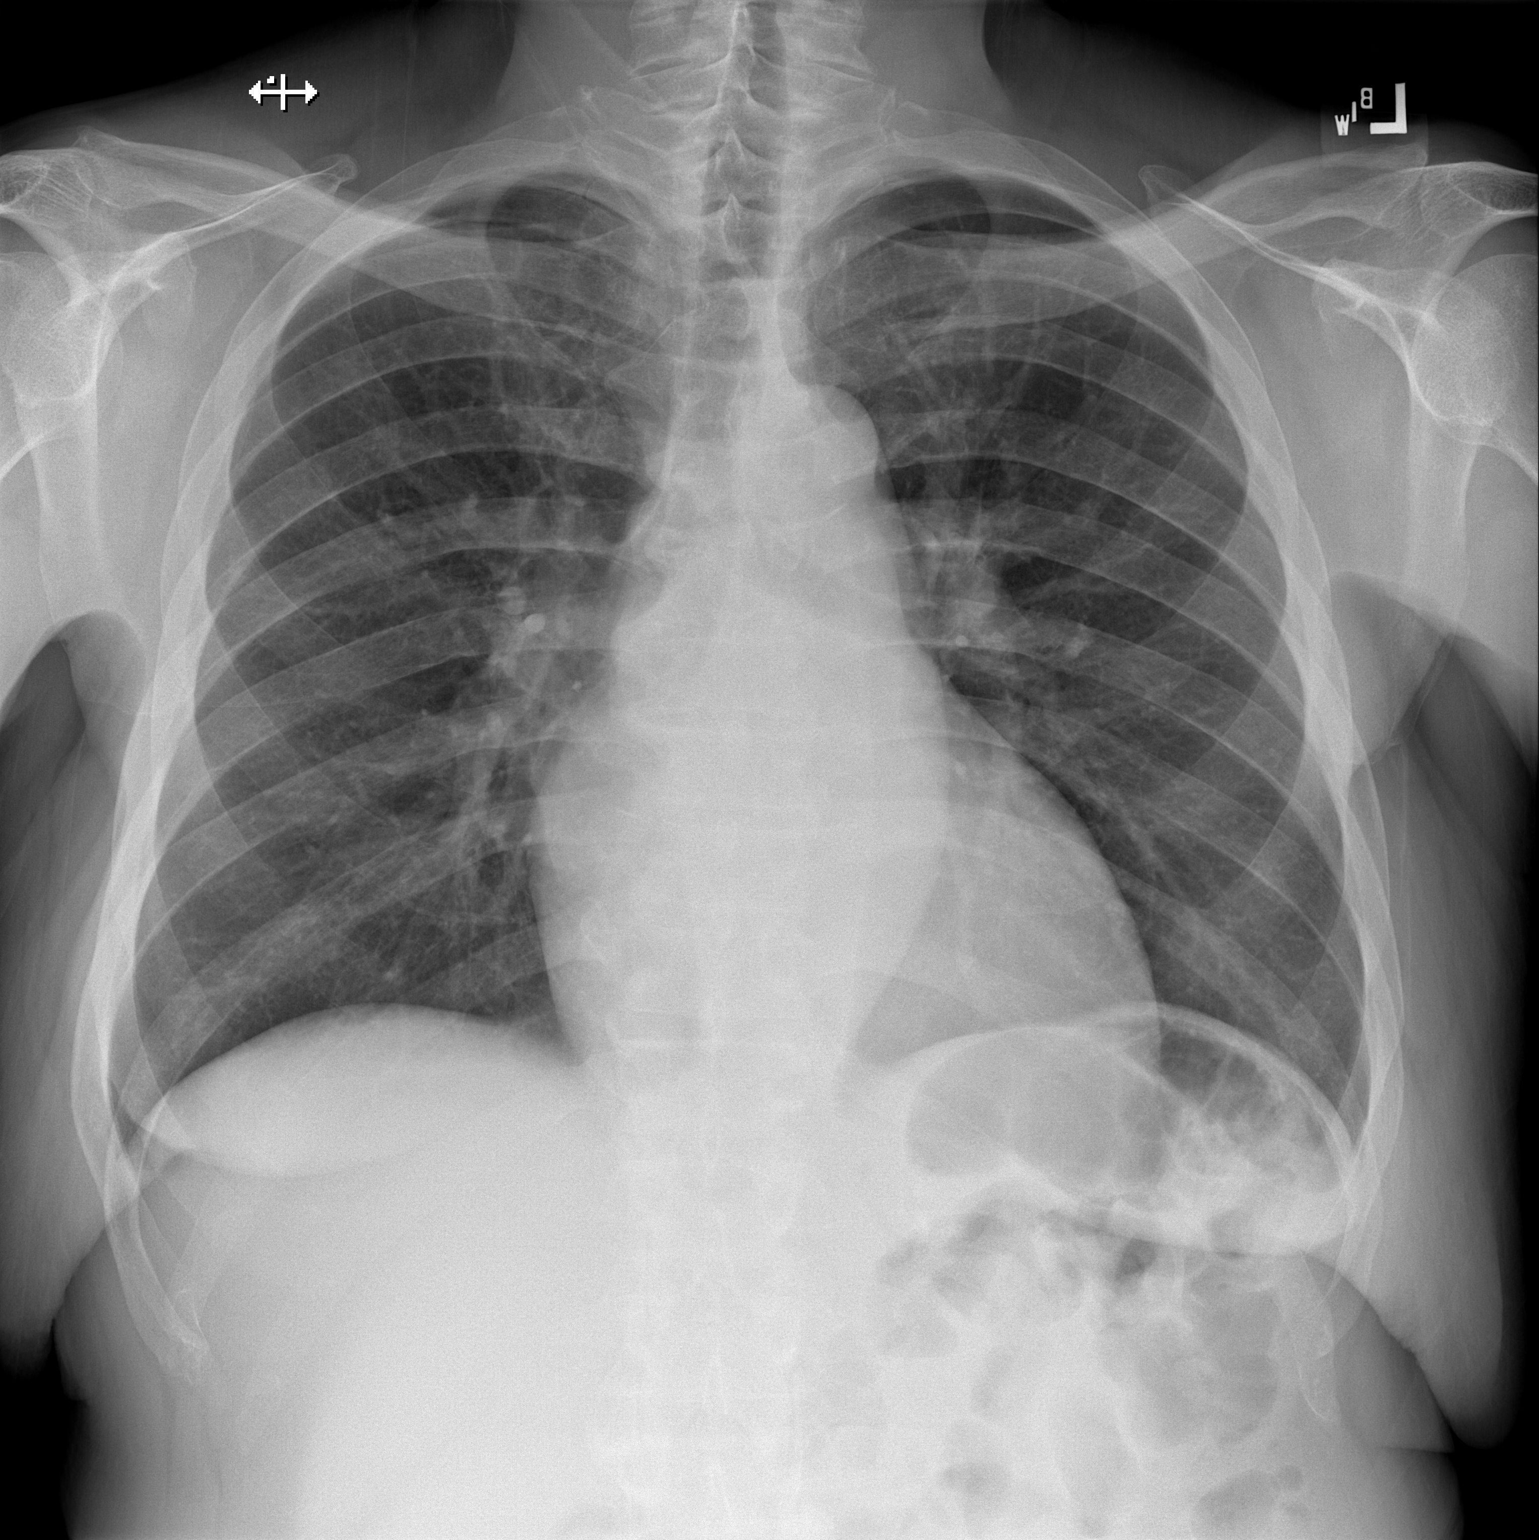

[w chest lat]
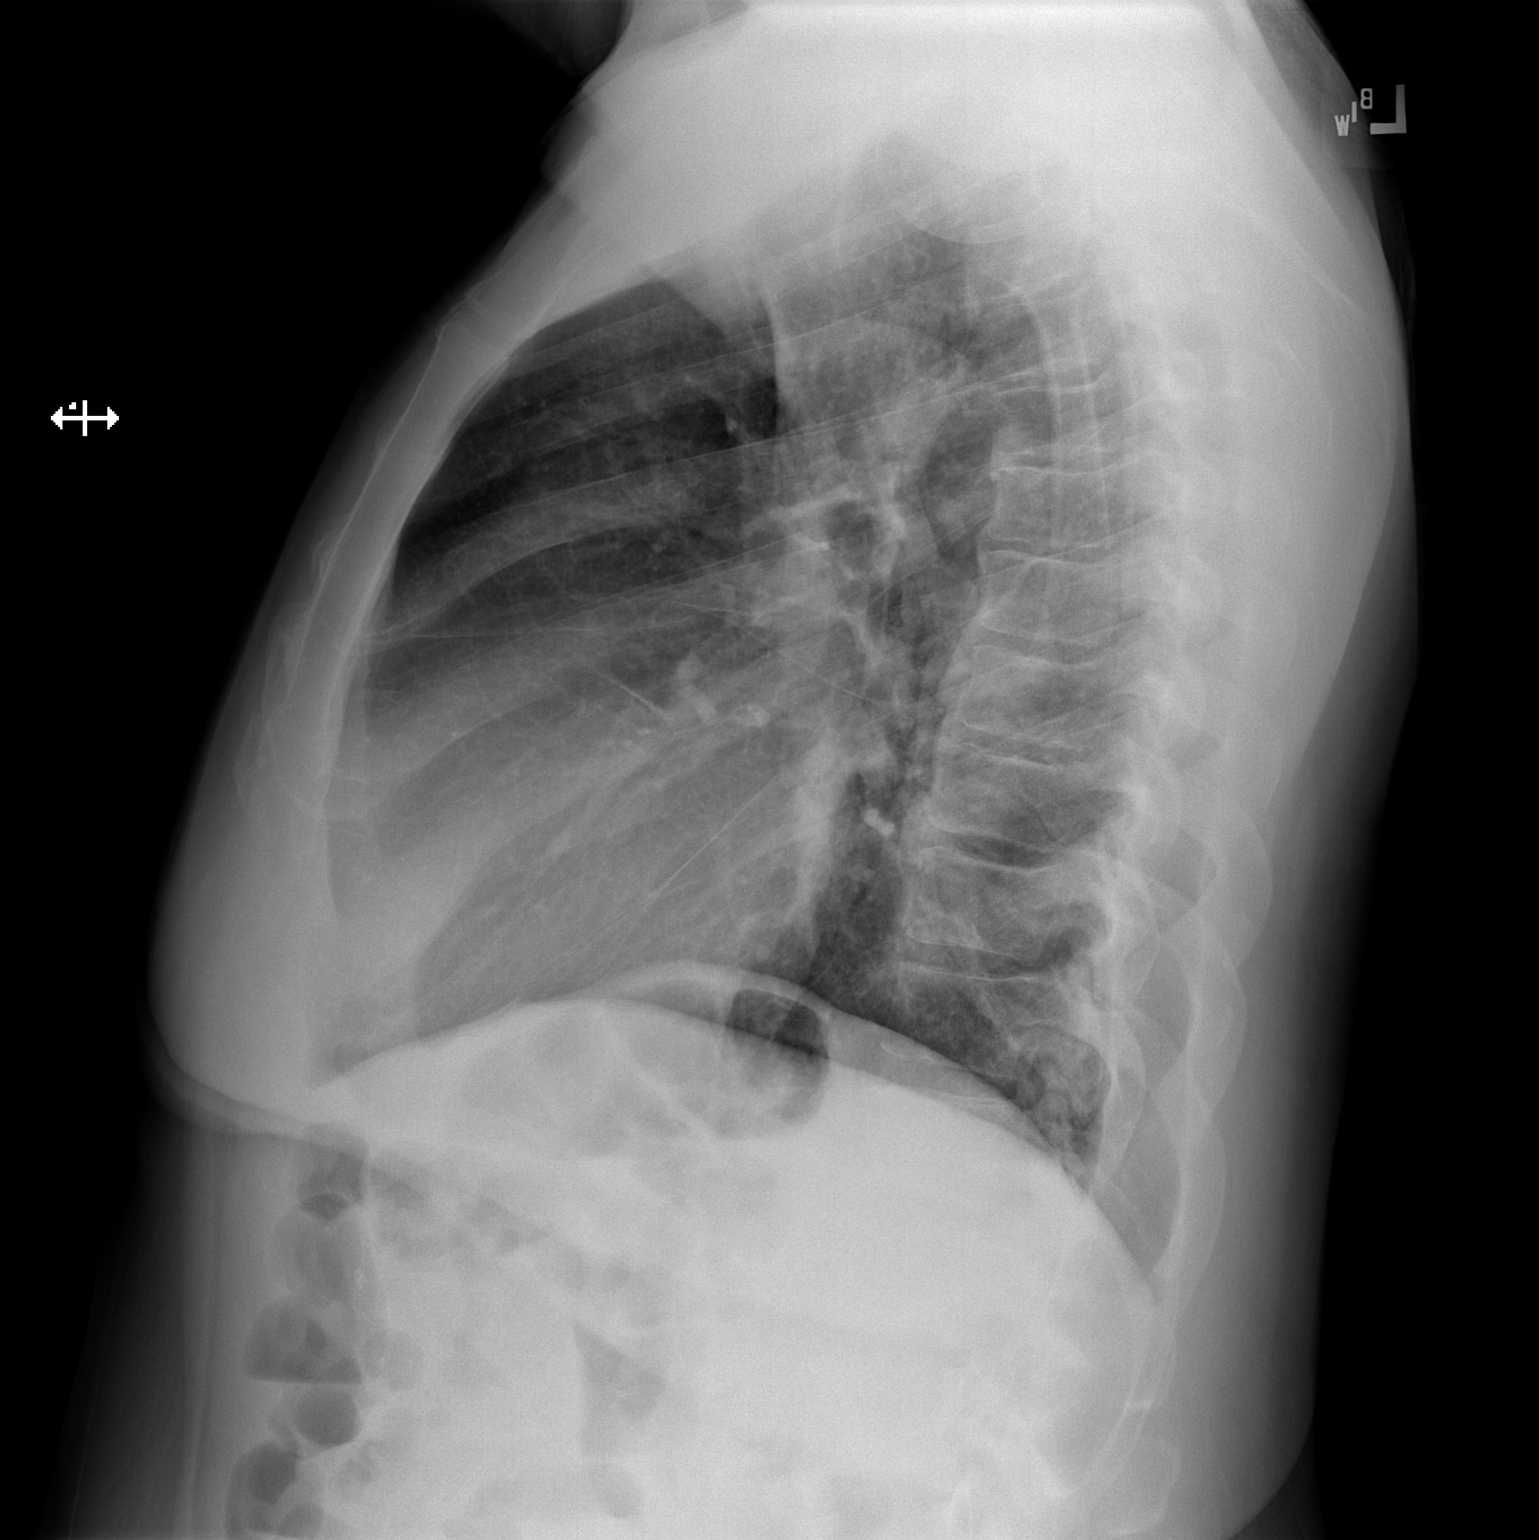

[2 of 2 positions shown; findings below may reference images not displayed]

FINDINGS: Cardiac shadow is at the upper limits of normal in size improved
from the prior exam. The lungs are clear bilaterally. No focal
infiltrate or sizable effusion is seen. Mild degenerative changes of
the thoracic spine are noted.
IMPRESSION: No acute abnormality noted.

## 2019-01-31 IMAGING — CR DG CHEST 1V PORT
1 series · 1 of 1 positions shown · non-contrast
Comparison: Chest x-ray of 06/27/2016

CLINICAL DATA: Postop, lost needle

EXAM:
PORTABLE CHEST 1 VIEW

[AP]
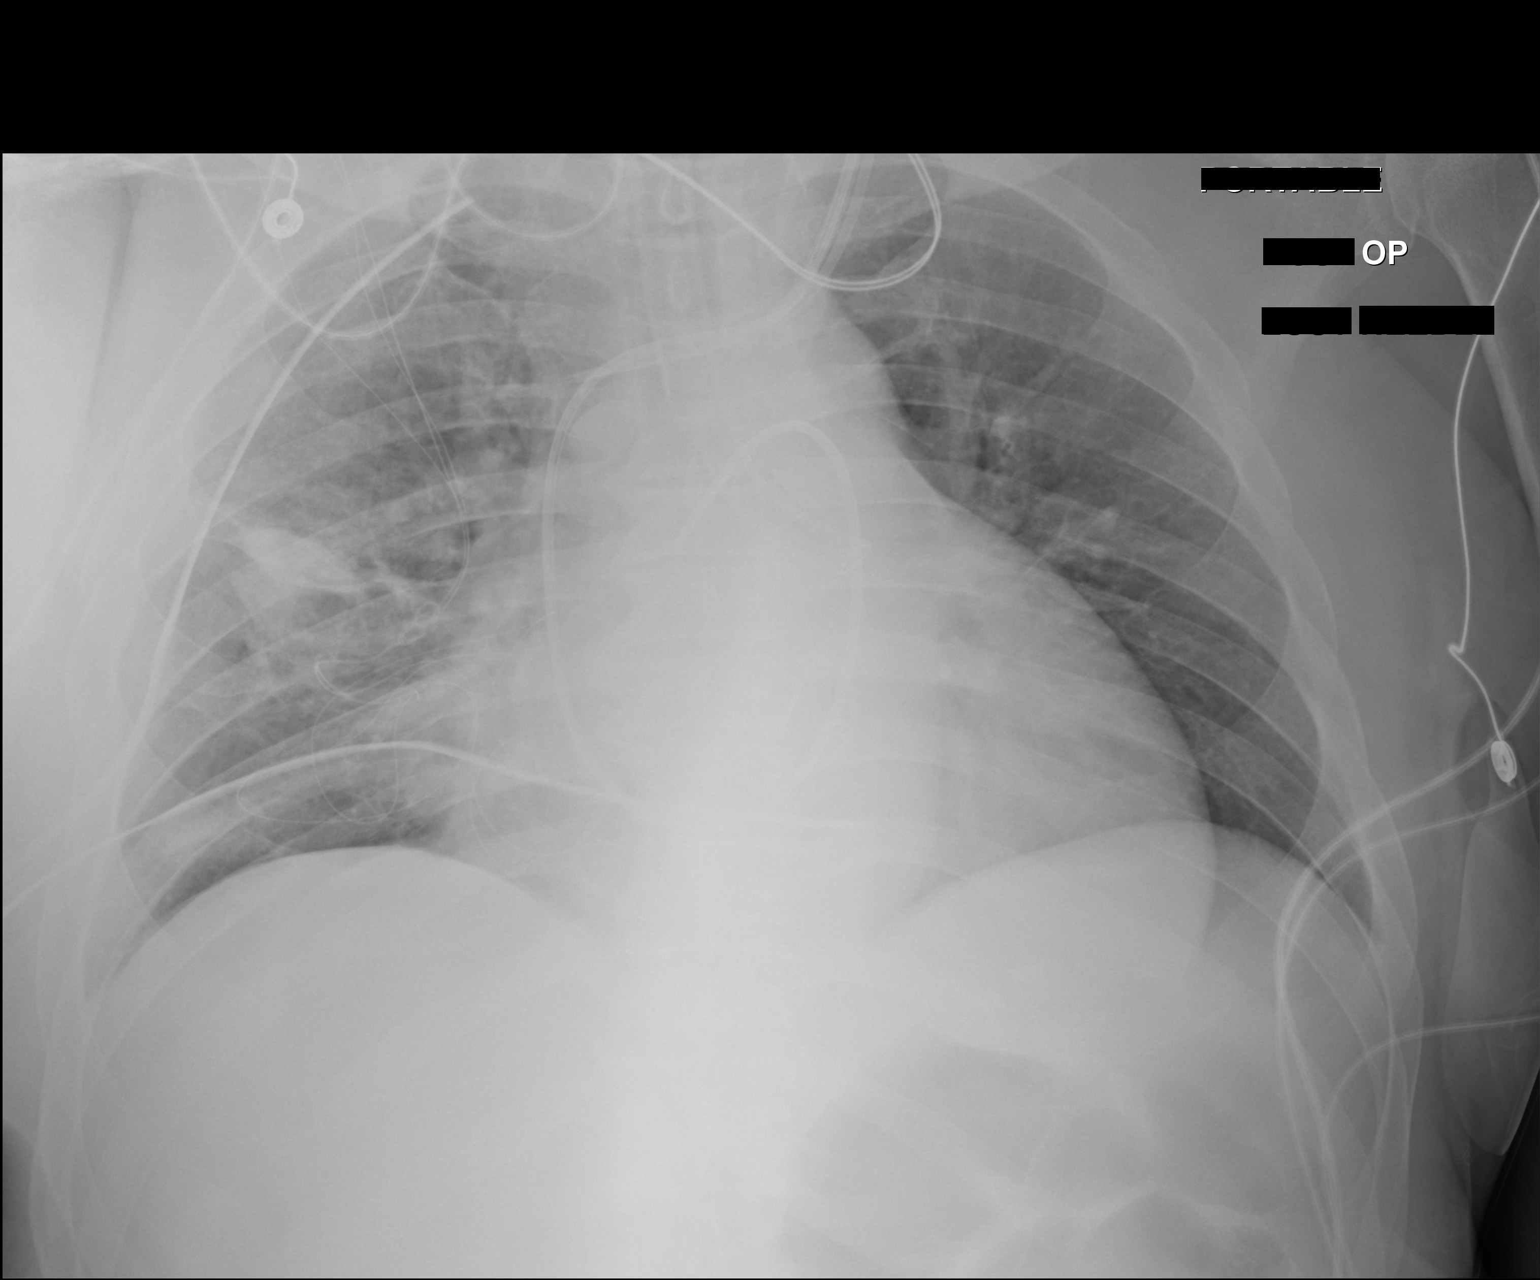

[1 of 1 positions shown; findings below may reference images not displayed]

FINDINGS: The tip of the endotracheal tube is approximately 1.7 cm above the
carina, but there does appear to be a portion of the tube that
extends into the left mainstem bronchus. Correlation with surgical
procedure is recommended. Swan-Ganz catheter tip is in the right
main pulmonary artery and a central venous line from the left
overlies the upper SVC. The lungs are not well aerated with linear
atelectasis in the right mid lung. Multiple leads overlie the chest
but no definite retained needle is evident.
IMPRESSION: 1. Suboptimal inspiration with right mid lung atelectasis.
2. Tip of endotracheal 1.7 cm above the carina but an extension of
the tube appears to extend into the left mainstem bronchus.
Correlate clinically.
3. No definite retained foreign body is evident.

## 2019-02-10 MED ORDER — CALCITRIOL 0.25 MCG PO CAPS
0.50 | ORAL_CAPSULE | ORAL | Status: DC
Start: 2019-02-12 — End: 2019-02-10

## 2019-02-10 MED ORDER — CALCIUM CARBONATE ANTACID 500 MG PO CHEW
1000.00 | CHEWABLE_TABLET | ORAL | Status: DC
Start: 2019-02-12 — End: 2019-02-10

## 2019-02-10 MED ORDER — SODIUM CITRATE 4 % VI SOSY
5.00 | PREFILLED_SYRINGE | Status: DC
Start: ? — End: 2019-02-10

## 2019-02-10 MED ORDER — PANTOPRAZOLE SODIUM 40 MG PO TBEC
40.00 | DELAYED_RELEASE_TABLET | ORAL | Status: DC
Start: 2019-02-13 — End: 2019-02-10

## 2019-02-10 MED ORDER — HEPARIN SODIUM (PORCINE) 5000 UNIT/ML IJ SOLN
5000.00 | INTRAMUSCULAR | Status: DC
Start: 2019-02-12 — End: 2019-02-10

## 2019-02-10 MED ORDER — SODIUM BICARBONATE 650 MG PO TABS
1300.00 | ORAL_TABLET | ORAL | Status: DC
Start: 2019-02-10 — End: 2019-02-10

## 2019-02-10 MED ORDER — FERROUS SULFATE 325 (65 FE) MG PO TABS
325.00 | ORAL_TABLET | ORAL | Status: DC
Start: 2019-02-12 — End: 2019-02-10

## 2019-02-10 MED ORDER — ALUMINUM HYDROXIDE GEL 320 MG/5ML PO SUSP
640.00 | ORAL | Status: DC
Start: 2019-02-10 — End: 2019-02-10

## 2019-02-10 MED ORDER — ACETAMINOPHEN 325 MG PO TABS
650.00 | ORAL_TABLET | ORAL | Status: DC
Start: ? — End: 2019-02-10

## 2019-02-10 MED ORDER — ASPIRIN 81 MG PO TBEC
81.00 | DELAYED_RELEASE_TABLET | ORAL | Status: DC
Start: 2019-02-13 — End: 2019-02-10

## 2019-02-11 ENCOUNTER — Other Ambulatory Visit: Payer: Self-pay | Admitting: Urology

## 2019-02-11 NOTE — Progress Notes (Signed)
Reviewed pt hx in epic noted he has CHF w/ ef 35% per echo. Pt is not a candidate for ambulatory surgery center by anesthesia guidelines since ef below 40%.  Called and lvm w/ Coni, OR scheduler for Dr Lovena Neighbours, that pt will need to be done at main OR.

## 2019-02-12 MED ORDER — GENERIC EXTERNAL MEDICATION
3.00 | Status: DC
Start: ? — End: 2019-02-12

## 2019-02-12 MED ORDER — SEVELAMER CARBONATE 800 MG PO TABS
800.00 | ORAL_TABLET | ORAL | Status: DC
Start: 2019-02-12 — End: 2019-02-12

## 2019-02-12 MED ORDER — DARBEPOETIN ALFA 40 MCG/0.4ML IJ SOSY
40.00 | PREFILLED_SYRINGE | INTRAMUSCULAR | Status: DC
Start: 2019-02-18 — End: 2019-02-12

## 2019-02-13 NOTE — Patient Instructions (Signed)
DUE TO COVID-19 ONLY ONE VISITOR IS ALLOWED TO COME WITH YOU AND STAY IN THE WAITING ROOM ONLY DURING PRE OP AND PROCEDURE DAY OF SURGERY. THE 1 VISITOR MAY VISIT WITH YOU AFTER SURGERY IN YOUR PRIVATE ROOM DURING VISITING HOURS ONLY!  YOU NEED TO HAVE A COVID 19 TEST ON_ Saturday 02/13/2021______ @_______ , THIS TEST MUST BE DONE BEFORE SURGERY, COME  [place], [place] , [id number].  ([place]) ONCE YOUR COVID TEST IS COMPLETED, PLEASE BEGIN THE QUARANTINE INSTRUCTIONS AS OUTLINED IN YOUR HANDOUT.                Vernon Blackburn     Your procedure is scheduled on: Wednesday 02/19/2019   Report to [facility] Main  Entrance    Report to admitting at  1115 AM     Call this number if you have problems the morning of surgery [phone]    Remember: Do not eat food or drink liquids :After Midnight.     BRUSH YOUR TEETH MORNING OF SURGERY AND RINSE YOUR MOUTH OUT, NO CHEWING GUM CANDY OR MINTS.     Take these medicines the morning of surgery with A SIP OF WATER: Isosorbide mononitrate (Imdur), Hydralazine (Apresoline), Tamulosin (Flomax), use Albuterol inhaler if needed and bring inhaler with  you to the hospital                                 You may not have any metal on your body including hair pins and              piercings  Do not wear jewelry, make-up, lotions, powders or perfumes, deodorant                          Men may shave face and neck.   Do not bring valuables to the hospital. [place].  Contacts, dentures or bridgework may not be worn into surgery.  Leave suitcase in the car. After surgery it may be brought to your room.     Patients discharged the day of surgery will not be allowed to drive home. IF YOU ARE HAVING SURGERY AND GOING HOME THE SAME DAY, YOU MUST HAVE AN ADULT TO DRIVE YOU HOME AND  BE WITH YOU FOR 24 HOURS. YOU MAY GO HOME BY TAXI OR UBER OR ORTHERWISE, BUT AN  ADULT MUST ACCOMPANY YOU HOME AND STAY WITH YOU FOR 24 HOURS.  Name and phone number of your driver:                Please read over the following fact sheets you were given: _____________________________________________________________________             [facility] - Preparing for Surgery Before surgery, you can play an important role.  Because skin is not sterile, your skin needs to be as free of germs as possible.  You can reduce the number of germs on your skin by washing with CHG (chlorahexidine gluconate) soap before surgery.  CHG is an antiseptic cleaner which kills germs and bonds with the skin to continue killing germs even after washing. Please DO NOT use if you have an allergy to CHG or antibacterial soaps.  If your skin becomes reddened/irritated stop using the CHG and  inform your nurse when you arrive at Short Stay. Do not shave (including legs and underarms) for at least 48 hours prior to the first CHG shower.  You may shave your face/neck. Please follow these instructions carefully:  1.  Shower with CHG Soap the night before surgery and the  morning of Surgery.  2.  If you choose to wash your hair, wash your hair first as usual with your  normal  shampoo.  3.  After you shampoo, rinse your hair and body thoroughly to remove the  shampoo.                           4.  Use CHG as you would any other liquid soap.  You can apply chg directly  to the skin and wash                       Gently with a scrungie or clean washcloth.  5.  Apply the CHG Soap to your body ONLY FROM THE NECK DOWN.   Do not use on face/ open                           Wound or open sores. Avoid contact with eyes, ears mouth and genitals (private parts).                       Wash face,  Genitals (private parts) with your normal soap.             6.  Wash thoroughly, paying special attention to the area where your surgery  will be performed.  7.  Thoroughly rinse your body with warm water from the neck  down.  8.  DO NOT shower/wash with your normal soap after using and rinsing off  the CHG Soap.                9.  Pat yourself dry with a clean towel.            10.  Wear clean pajamas.            11.  Place clean sheets on your bed the night of your first shower and do not  sleep with pets. Day of Surgery : Do not apply any lotions/deodorants the morning of surgery.  Please wear clean clothes to the hospital/surgery center.  FAILURE TO FOLLOW THESE INSTRUCTIONS MAY RESULT IN THE CANCELLATION OF YOUR SURGERY PATIENT SIGNATURE_________________________________  NURSE SIGNATURE__________________________________  ________________________________________________________________________

## 2019-02-14 ENCOUNTER — Encounter (HOSPITAL_COMMUNITY)
Admission: RE | Admit: 2019-02-14 | Discharge: 2019-02-14 | Disposition: A | Discharge status: Home / Self Care | Payer: Medicaid Other | Source: Ambulatory Visit | Attending: Urology | Admitting: Urology

## 2019-02-14 ENCOUNTER — Encounter (HOSPITAL_COMMUNITY): Payer: Self-pay | Admitting: *Deleted

## 2019-02-14 NOTE — Progress Notes (Addendum)
LM on VM [phone] to call back to go over instructions for surgery on 02/19/2019.  Attempted to call spouse but phone number [phone] is not a working number.

## 2019-02-14 NOTE — Progress Notes (Signed)
LM on VM [phone] to call back to go over medical history and get instructions for surgery on 02/19/2019.

## 2019-02-14 NOTE — Progress Notes (Signed)
LM on VM [phone] to go over medical history and give instructions for surgery on 02/19/2019

## 2019-02-17 ENCOUNTER — Telehealth (HOSPITAL_COMMUNITY): Payer: Self-pay

## 2019-02-17 NOTE — Telephone Encounter (Signed)

## 2019-02-18 ENCOUNTER — Encounter (HOSPITAL_COMMUNITY): Payer: Self-pay | Admitting: Cardiology

## 2019-02-18 ENCOUNTER — Ambulatory Visit (HOSPITAL_COMMUNITY)
Admission: RE | Admit: 2019-02-18 | Discharge: 2019-02-18 | Disposition: A | Discharge status: Home / Self Care | Payer: Medicaid Other | Source: Ambulatory Visit | Attending: Cardiology | Admitting: Cardiology

## 2019-02-18 ENCOUNTER — Other Ambulatory Visit: Payer: Self-pay

## 2019-02-18 VITALS — BP 98/80 | HR 85 | Wt 189.2 lb

## 2019-02-18 DIAGNOSIS — I420 Dilated cardiomyopathy: Secondary | ICD-10-CM | POA: Insufficient documentation

## 2019-02-18 DIAGNOSIS — I428 Other cardiomyopathies: Secondary | ICD-10-CM | POA: Insufficient documentation

## 2019-02-18 DIAGNOSIS — N186 End stage renal disease: Secondary | ICD-10-CM | POA: Insufficient documentation

## 2019-02-18 DIAGNOSIS — Z7982 Long term (current) use of aspirin: Secondary | ICD-10-CM | POA: Insufficient documentation

## 2019-02-18 DIAGNOSIS — I340 Nonrheumatic mitral (valve) insufficiency: Secondary | ICD-10-CM

## 2019-02-18 DIAGNOSIS — I5022 Chronic systolic (congestive) heart failure: Secondary | ICD-10-CM | POA: Diagnosis not present

## 2019-02-18 DIAGNOSIS — M109 Gout, unspecified: Secondary | ICD-10-CM | POA: Insufficient documentation

## 2019-02-18 DIAGNOSIS — J45909 Unspecified asthma, uncomplicated: Secondary | ICD-10-CM | POA: Insufficient documentation

## 2019-02-18 DIAGNOSIS — Z79899 Other long term (current) drug therapy: Secondary | ICD-10-CM | POA: Diagnosis not present

## 2019-02-18 DIAGNOSIS — Z9889 Other specified postprocedural states: Secondary | ICD-10-CM | POA: Diagnosis not present

## 2019-02-18 DIAGNOSIS — Z8673 Personal history of transient ischemic attack (TIA), and cerebral infarction without residual deficits: Secondary | ICD-10-CM | POA: Diagnosis not present

## 2019-02-18 DIAGNOSIS — I132 Hypertensive heart and chronic kidney disease with heart failure and with stage 5 chronic kidney disease, or end stage renal disease: Secondary | ICD-10-CM | POA: Diagnosis not present

## 2019-02-18 DIAGNOSIS — I052 Rheumatic mitral stenosis with insufficiency: Secondary | ICD-10-CM | POA: Diagnosis not present

## 2019-02-18 LAB — LIPID PANEL
Cholesterol: 195 mg/dL (ref 0–200)
HDL: 55 mg/dL (ref >40)
LDL Cholesterol: 127 mg/dL — ABNORMAL HIGH (ref 0–99)
Total CHOL/HDL Ratio: 3.5 RATIO
Triglycerides: 67 mg/dL (ref <150)
VLDL: 13 mg/dL (ref 0–40)

## 2019-02-18 LAB — BASIC METABOLIC PANEL
Anion gap: 16 — ABNORMAL HIGH (ref 5–15)
BUN: 49 mg/dL — ABNORMAL HIGH (ref 6–20)
CO2: 22 mmol/L (ref 22–32)
Calcium: 7.7 mg/dL — ABNORMAL LOW (ref 8.9–10.3)
Chloride: 101 mmol/L (ref 98–111)
Creatinine, Ser: 6.77 mg/dL — ABNORMAL HIGH (ref 0.61–1.24)
GFR calc Af Amer: 10 mL/min — ABNORMAL LOW (ref >60)
GFR calc non Af Amer: 9 mL/min — ABNORMAL LOW (ref >60)
Glucose, Bld: 87 mg/dL (ref 70–99)
Potassium: 3.5 mmol/L (ref 3.5–5.1)
Sodium: 139 mmol/L (ref 135–145)

## 2019-02-18 LAB — CBC
HCT: 30 % — ABNORMAL LOW (ref 39.0–52.0)
Hemoglobin: 9 g/dL — ABNORMAL LOW (ref 13.0–17.0)
MCH: 28.6 pg (ref 26.0–34.0)
MCHC: 30 g/dL (ref 30.0–36.0)
MCV: 95.2 fL (ref 80.0–100.0)
Platelets: 329 10*3/uL (ref 150–400)
RBC: 3.15 MIL/uL — ABNORMAL LOW (ref 4.22–5.81)
RDW: 14.6 % (ref 11.5–15.5)
WBC: 10.8 10*3/uL — ABNORMAL HIGH (ref 4.0–10.5)
nRBC: 0 % (ref 0.0–0.2)

## 2019-02-18 NOTE — Patient Instructions (Addendum)
STOP Imdur    Labs today We will only contact you if something comes back abnormal or we need to make some changes. Otherwise no news is good news!   Your physician recommends that you schedule a follow-up appointment in: 4 months with [name]   Dear Mr Vernon Blackburn,  You are scheduled for a TEE (Transesophageal Echocardiogram) on Thursday, March 25th, 2021 with [name].  Please arrive at the [facility] (Main Entrance A) at [facility]: [address] [place], [place] [address] at 7:30am.   DIET: Nothing to eat or drink after midnight except a sip of water with medications (see medication instructions below)  Medication Instructions: Hold All morning medications on the day of your procedure  You will need to continue your anticoagulant after your procedure until you are told by your  Provider that it is safe to stop   Labs: Done today in office  You will need a pre procedure COVID test     WHEN:  February 22nd, 2021 at 12:10pm  WHERE: [facility]                  [place] [address]  This is a drive thru testing site, you will remain in your car. Be sure to get in the line FOR PROCEDURES Once you have been swabbed you will need to remain home in quarantine until you return for your procedure.   You must have a responsible person to drive you home and stay in the waiting area during your procedure. Failure to do so could result in cancellation.  Bring your insurance cards.  *Special Note: Every effort is made to have your procedure done on time. Occasionally there are emergencies that occur at the hospital that may cause delays. Please be patient if a delay does occur.

## 2019-02-19 ENCOUNTER — Telehealth (HOSPITAL_COMMUNITY): Payer: Self-pay

## 2019-02-19 ENCOUNTER — Other Ambulatory Visit (HOSPITAL_COMMUNITY): Payer: Self-pay

## 2019-02-19 ENCOUNTER — Ambulatory Visit (HOSPITAL_COMMUNITY): Admission: RE | Admit: 2019-02-19 | Payer: Medicaid Other | Source: Home / Self Care | Admitting: Urology

## 2019-02-19 DIAGNOSIS — I340 Nonrheumatic mitral (valve) insufficiency: Secondary | ICD-10-CM

## 2019-02-19 SURGERY — CYSTOSCOPY/URETEROSCOPY/HOLMIUM LASER/STENT PLACEMENT
Anesthesia: Choice | Laterality: Right

## 2019-02-19 NOTE — Telephone Encounter (Signed)
Pt wearing a zio patch at yesterdays visit.  However it was placed by another doctors office. Dr Aundra Dubin would like for thos results to be sent to [place].  Spoke with Ailene Ravel from [employer]io, she will look into it and get back to me as soon as possible.

## 2019-02-19 NOTE — Progress Notes (Signed)
PCP: Dr. Delena Bali HF Cardiology: [name]  54 y.o. with history of long-standing, poorly-controlled HTN, CKD stage 3-4, chronic systolic CHF and severe mitral regurgitation presents for followup of CHF and HTN.  He has had hypertension for years.  He has had known CKD, followed by Dr. [name].  He developed severe dyspnea in 12/17 and was admitted with acute systolic CHF.  Echo showed EF 20-25% with severe MR.  He was diuresed and discharged.   TEE was done in 5/18 to assess mitral valve.  There was severe MR.  The MV was thickened and did not coapt well.  Suspect there was a component of secondary MR with moderate LV dilation, however the thickened leaflets suggested a component of primary MR as well.   He had right and left heart cath in 6/18.  No significant CAD, elevated filling pressures.    In 6/18, he had mitral valve repair.  Post-op echo in 7/18 showed EF 25-30% with stable MV repair (mild MR, mean gradient 4 mmHg).  Echo 12/18 with EF 30-35%, moderate LV dilation, s/p MVR repair with trivial MR.  Echo in 4/19 showed EF up to 40-45% with stable repaired mitral valve.   He had an echo in 8/20 with EF 35%, mild LV dilation, s/p MV repair with mean gradient 4 mmHg and trivial MR, mildly decreased RV systolic function.   He was admitted in 2/21 to [facility] with worsening renal function and HD was started.  Echo at [facility] in 2/21 showed EF 40-45%, mild LV dilation, moderate LVH, "severe mitral stenosis" with mean gradient 7 mmHg and eccentric mild MR.  Head imagining showed an old CVA.  He is now wearing a 2 wk Zio patch to assess for atrial fibrillation.   He is currently stable at home.  He is getting HD MWF.  He is tolerating HD reasonably well though BP-active meds have been cut back.  Breathing is much better since starting HD, no dyspnea walking on flat ground or with usual ADLs.  No chest pain.  Weight is down 35 lbs.  No orthopnea/PND.     ECG (personally reviewed): NSR, QTc 481   Labs  (4/18): K 3.7, creatinine 3.05, hgb 10.4 Labs (5/18): K 3.3, creatinine 2.89, BNP 789 Labs (6/18): K 3.4, creatinine 2.4, hgb 10.1 Labs (7/18): K 3.2, creatinine 2.27, BNP 303 Labs (9/18): LDL 78, HDL 56, K 3.8, creatinine 2.16 Labs (10/18): hgb 10.1 Labs (2/19): K 3.3, creatinine 2.75, hgb 12.1 Labs (5/19): K 3.6, creatinine 3.07 Labs (7/20): K 4, creatinine 3.55, hgb 12.8  PMH: 1. Gout 2. HTN: Long-standing, poor control. 3. ESRD.  Likely hypertensive nephropathy.  4. Dilated cardiomyopathy: May be due to long-standing HTN.  - Echo (1/18): EF 20-25%, severe MR - Echo (2/18): EF 25%, severe MR.  - TEE (5/18): Moderate LV dilation with severe global hypokinesis, EF 25-30%, normal RV size with mildly decreased systolic function, RV-RA gradient 55 mmHg, severe MR with mildly thickened leaflets with inadequate coaptation and ERO 0.52 cm^2 by PISA and systolic flow reversal in the pulmonary vein doppler pattern => secondary MR from dilated LV but thickened leaflets lead to concern for component of primary MR as well.  - RHC/LHC (6/18): No significant CAD.  Mean RA 15, PA 76/31 mean 48, mean PCWP 45, CI 3.02 Fick, 2.64 thermo.  - Echo (7/18): EF 25-30%, mild dilation, diffuse hypokinesis, s/p MV repair with mean gradient 4 mmHg and mild MR, PASP 37 mmHg, normal RV size and  systolic function.  - Echo (12/18): EF 30-35%, moderate LV dilation with diffuse hypokinesis, stable MV repair.  - Echo (4/19): EF 40-45%, severe LV dilation, s/p mitral valve repair with mean gradient 4 mmHg, no mitral regurgitation, severe RV dilation with normal systolic function.  - Echo (8/20): EF 35%, mild LV dilation, s/p MV repair with mean gradient 4 mmHg and trivial MR, mildly decreased RV systolic function.  - Echo (2/21, [facility]):  EF 40-45%, mild LV dilation, moderate LVH, "severe mitral stenosis" with mean gradient 7 mmHg and eccentric mild MR. 5. Severe MR: See TEE report above.  - S/p MV repair in 6/18. 6. H/o  CVA  SH: Married, lives in [place], nonsmoker, no drugs, occasional ETOH.  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   Family History  Problem Relation Age of Onset  . Diabetes Mother   . Hypertension Mother   . Heart failure Mother   . Colon cancer Neg Hx   . Esophageal cancer Neg Hx   . Rectal cancer Neg Hx   . Stomach cancer Neg Hx    ROS: All systems reviewed and negative except as per HPI.   Current Outpatient Medications  Medication Sig Dispense Refill  . albuterol (PROVENTIL HFA;VENTOLIN HFA) 108 (90 Base) MCG/ACT inhaler Inhale 2 puffs into the lungs every 6 (six) hours as needed for wheezing or shortness of breath. 1 Inhaler 2  . aspirin EC 81 MG tablet Take 1 tablet (81 mg total) by mouth daily. 90 tablet 3  . calcitRIOL (ROCALTROL) 0.25 MCG capsule Take 0.25 mcg by mouth daily. On hold    . carvedilol (COREG) 12.5 MG tablet Take 6.25 mg by mouth 2 (two) times daily.     . cetirizine (ZYRTEC) 10 MG tablet Take 10 mg by mouth daily.    . diphenhydrAMINE (BENADRYL) 25 MG tablet Take 25 mg by mouth at bedtime as needed for sleep.    . febuxostat (ULORIC) 40 MG tablet Take 40 mg by mouth daily.     . fluticasone (FLONASE) 50 MCG/ACT nasal spray Place 1 spray into both nostrils 2 (two) times daily as needed for allergies or rhinitis.    . folic acid-vitamin b complex-vitamin c-selenium-zinc (DIALYVITE) 3 MG TABS tablet Take 1 tablet by mouth every evening.    . furosemide (LASIX) 40 MG tablet Take 1 tablet by mouth everyday except on Monday,wednesday and Friday    . sevelamer carbonate (RENVELA) 800 MG tablet Take 800 mg by mouth 3 (three) times daily with meals.    . sildenafil (REVATIO) 20 MG tablet Take 20 mg by mouth as needed (ED).     . SM ANTACID 500 MG chewable tablet Chew 1,000 mg by mouth at bedtime.     No current facility-administered medications for this encounter.   BP 98/80   Pulse 85   Wt 85.8 kg (189 lb 3.2 oz)   SpO2 100%   BMI 27.15  kg/m  General: NAD Neck: No JVD, no thyromegaly or thyroid nodule.  Lungs: Clear to auscultation bilaterally with normal respiratory effort. CV: Nondisplaced PMI.  Heart regular S1/S2, no S3/S4, 1/6 SEM.  No peripheral edema.  No carotid bruit.  Normal pedal pulses.  Abdomen: Soft, nontender, no hepatosplenomegaly, no distention.  Skin: Intact without lesions or rashes.  Neurologic: Alert and oriented x 3.  Psych: Normal affect. Extremities: No clubbing or cyanosis.  HEENT: Normal.   Assessment/Plan: 1. HTN: Possible hypertensive cardiomyopathy and hypertensive nephropathy.  BP now on  the low side with HD.  - He is off hydralazine and amlodipine, can stop Imdur.  2. ESRD: Tolerating HD so far.  Wants to eventually do PD.     3. Chronic systolic CHF: Echo in 6/04 with EF 25-30%. Nonischemic cardiomyopathy based on cath in 6/18.  Cause of cardiomyopathy may be long-standing, poorly controlled HTN.  NYHA class II symptoms.  He was initially improved symptomatically s/p MV repair.  Echo in 4/19 with EF 40-45%. Last echo at [facility] in 2/21 showed EF 40-45%.   He is not volume overloaded on exam, NYHA class II.  - Continue Coreg 6.25 mg bid, no BP room to increase. - He does not have BP room to add other cardiomyopathy meds.  - EF out of ICD range.  - HD to manage volume, weight down.  4. Mitral regurgitation: s/p MV repair for severe MR.  Much improved symptomatically s/p surgery. Echo at [facility] in 2/21 suggested "severe" mitral stenosis with mean gradient 7 mmHg and eccentric MR, ?mild.  He was told to followup on this at [facility].  - Will need abx with dental work given prosthetic material.  - For closer assessment of mitral valve, I think it would be worthwhile to get a TEE.  We discussed risks/benefits of procedure and he agrees to it. 5. Asthma: Occasional wheezing, not exertional.   6. CVA: Prior CVA by head imaging.  Cause uncertain, have not ruled out atrial fibrillation.  - He is wearing a  2 wk Zio patch to look for atrial fibrillation, will try to get the report sent to this office.   Followup in 4 months.   Loralie Champagne 02/19/2019

## 2019-02-19 NOTE — H&P (View-Only) (Signed)
PCP: Dr. Delena Bali HF Cardiology: [name]  54 y.o. with history of long-standing, poorly-controlled HTN, CKD stage 3-4, chronic systolic CHF and severe mitral regurgitation presents for followup of CHF and HTN.  He has had hypertension for years.  He has had known CKD, followed by Dr. [name].  He developed severe dyspnea in 12/17 and was admitted with acute systolic CHF.  Echo showed EF 20-25% with severe MR.  He was diuresed and discharged.   TEE was done in 5/18 to assess mitral valve.  There was severe MR.  The MV was thickened and did not coapt well.  Suspect there was a component of secondary MR with moderate LV dilation, however the thickened leaflets suggested a component of primary MR as well.   He had right and left heart cath in 6/18.  No significant CAD, elevated filling pressures.    In 6/18, he had mitral valve repair.  Post-op echo in 7/18 showed EF 25-30% with stable MV repair (mild MR, mean gradient 4 mmHg).  Echo 12/18 with EF 30-35%, moderate LV dilation, s/p MVR repair with trivial MR.  Echo in 4/19 showed EF up to 40-45% with stable repaired mitral valve.   He had an echo in 8/20 with EF 35%, mild LV dilation, s/p MV repair with mean gradient 4 mmHg and trivial MR, mildly decreased RV systolic function.   He was admitted in 2/21 to [facility] with worsening renal function and HD was started.  Echo at [facility] in 2/21 showed EF 40-45%, mild LV dilation, moderate LVH, "severe mitral stenosis" with mean gradient 7 mmHg and eccentric mild MR.  Head imagining showed an old CVA.  He is now wearing a 2 wk Zio patch to assess for atrial fibrillation.   He is currently stable at home.  He is getting HD MWF.  He is tolerating HD reasonably well though BP-active meds have been cut back.  Breathing is much better since starting HD, no dyspnea walking on flat ground or with usual ADLs.  No chest pain.  Weight is down 35 lbs.  No orthopnea/PND.     ECG (personally reviewed): NSR, QTc 481   Labs  (4/18): K 3.7, creatinine 3.05, hgb 10.4 Labs (5/18): K 3.3, creatinine 2.89, BNP 789 Labs (6/18): K 3.4, creatinine 2.4, hgb 10.1 Labs (7/18): K 3.2, creatinine 2.27, BNP 303 Labs (9/18): LDL 78, HDL 56, K 3.8, creatinine 2.16 Labs (10/18): hgb 10.1 Labs (2/19): K 3.3, creatinine 2.75, hgb 12.1 Labs (5/19): K 3.6, creatinine 3.07 Labs (7/20): K 4, creatinine 3.55, hgb 12.8  PMH: 1. Gout 2. HTN: Long-standing, poor control. 3. ESRD.  Likely hypertensive nephropathy.  4. Dilated cardiomyopathy: May be due to long-standing HTN.  - Echo (1/18): EF 20-25%, severe MR - Echo (2/18): EF 25%, severe MR.  - TEE (5/18): Moderate LV dilation with severe global hypokinesis, EF 25-30%, normal RV size with mildly decreased systolic function, RV-RA gradient 55 mmHg, severe MR with mildly thickened leaflets with inadequate coaptation and ERO 0.52 cm^2 by PISA and systolic flow reversal in the pulmonary vein doppler pattern => secondary MR from dilated LV but thickened leaflets lead to concern for component of primary MR as well.  - RHC/LHC (6/18): No significant CAD.  Mean RA 15, PA 76/31 mean 48, mean PCWP 45, CI 3.02 Fick, 2.64 thermo.  - Echo (7/18): EF 25-30%, mild dilation, diffuse hypokinesis, s/p MV repair with mean gradient 4 mmHg and mild MR, PASP 37 mmHg, normal RV size and  systolic function.  - Echo (12/18): EF 30-35%, moderate LV dilation with diffuse hypokinesis, stable MV repair.  - Echo (4/19): EF 40-45%, severe LV dilation, s/p mitral valve repair with mean gradient 4 mmHg, no mitral regurgitation, severe RV dilation with normal systolic function.  - Echo (8/20): EF 35%, mild LV dilation, s/p MV repair with mean gradient 4 mmHg and trivial MR, mildly decreased RV systolic function.  - Echo (2/21, [facility]):  EF 40-45%, mild LV dilation, moderate LVH, "severe mitral stenosis" with mean gradient 7 mmHg and eccentric mild MR. 5. Severe MR: See TEE report above.  - S/p MV repair in 6/18. 6. H/o  CVA  SH: Married, lives in [place], nonsmoker, no drugs, occasional ETOH.  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   Family History  Problem Relation Age of Onset  . Diabetes Mother   . Hypertension Mother   . Heart failure Mother   . Colon cancer Neg Hx   . Esophageal cancer Neg Hx   . Rectal cancer Neg Hx   . Stomach cancer Neg Hx    ROS: All systems reviewed and negative except as per HPI.   Current Outpatient Medications  Medication Sig Dispense Refill  . albuterol (PROVENTIL HFA;VENTOLIN HFA) 108 (90 Base) MCG/ACT inhaler Inhale 2 puffs into the lungs every 6 (six) hours as needed for wheezing or shortness of breath. 1 Inhaler 2  . aspirin EC 81 MG tablet Take 1 tablet (81 mg total) by mouth daily. 90 tablet 3  . calcitRIOL (ROCALTROL) 0.25 MCG capsule Take 0.25 mcg by mouth daily. On hold    . carvedilol (COREG) 12.5 MG tablet Take 6.25 mg by mouth 2 (two) times daily.     . cetirizine (ZYRTEC) 10 MG tablet Take 10 mg by mouth daily.    . diphenhydrAMINE (BENADRYL) 25 MG tablet Take 25 mg by mouth at bedtime as needed for sleep.    . febuxostat (ULORIC) 40 MG tablet Take 40 mg by mouth daily.     . fluticasone (FLONASE) 50 MCG/ACT nasal spray Place 1 spray into both nostrils 2 (two) times daily as needed for allergies or rhinitis.    . folic acid-vitamin b complex-vitamin c-selenium-zinc (DIALYVITE) 3 MG TABS tablet Take 1 tablet by mouth every evening.    . furosemide (LASIX) 40 MG tablet Take 1 tablet by mouth everyday except on Monday,wednesday and Friday    . sevelamer carbonate (RENVELA) 800 MG tablet Take 800 mg by mouth 3 (three) times daily with meals.    . sildenafil (REVATIO) 20 MG tablet Take 20 mg by mouth as needed (ED).     . SM ANTACID 500 MG chewable tablet Chew 1,000 mg by mouth at bedtime.     No current facility-administered medications for this encounter.   BP 98/80   Pulse 85   Wt 85.8 kg (189 lb 3.2 oz)   SpO2 100%   BMI 27.15  kg/m  General: NAD Neck: No JVD, no thyromegaly or thyroid nodule.  Lungs: Clear to auscultation bilaterally with normal respiratory effort. CV: Nondisplaced PMI.  Heart regular S1/S2, no S3/S4, 1/6 SEM.  No peripheral edema.  No carotid bruit.  Normal pedal pulses.  Abdomen: Soft, nontender, no hepatosplenomegaly, no distention.  Skin: Intact without lesions or rashes.  Neurologic: Alert and oriented x 3.  Psych: Normal affect. Extremities: No clubbing or cyanosis.  HEENT: Normal.   Assessment/Plan: 1. HTN: Possible hypertensive cardiomyopathy and hypertensive nephropathy.  BP now on  the low side with HD.  - He is off hydralazine and amlodipine, can stop Imdur.  2. ESRD: Tolerating HD so far.  Wants to eventually do PD.     3. Chronic systolic CHF: Echo in 7/86 with EF 25-30%. Nonischemic cardiomyopathy based on cath in 6/18.  Cause of cardiomyopathy may be long-standing, poorly controlled HTN.  NYHA class II symptoms.  He was initially improved symptomatically s/p MV repair.  Echo in 4/19 with EF 40-45%. Last echo at [facility] in 2/21 showed EF 40-45%.   He is not volume overloaded on exam, NYHA class II.  - Continue Coreg 6.25 mg bid, no BP room to increase. - He does not have BP room to add other cardiomyopathy meds.  - EF out of ICD range.  - HD to manage volume, weight down.  4. Mitral regurgitation: s/p MV repair for severe MR.  Much improved symptomatically s/p surgery. Echo at [facility] in 2/21 suggested "severe" mitral stenosis with mean gradient 7 mmHg and eccentric MR, ?mild.  He was told to followup on this at [facility].  - Will need abx with dental work given prosthetic material.  - For closer assessment of mitral valve, I think it would be worthwhile to get a TEE.  We discussed risks/benefits of procedure and he agrees to it. 5. Asthma: Occasional wheezing, not exertional.   6. CVA: Prior CVA by head imaging.  Cause uncertain, have not ruled out atrial fibrillation.  - He is wearing a  2 wk Zio patch to look for atrial fibrillation, will try to get the report sent to this office.   Followup in 4 months.   Loralie Champagne 02/19/2019

## 2019-02-21 NOTE — Telephone Encounter (Signed)
Received response from Zio rep stating that results of  will be faxed/emailed to me early March as it was initially placed on March 10th. It will be on until 2/24.

## 2019-02-24 ENCOUNTER — Other Ambulatory Visit (HOSPITAL_COMMUNITY): Payer: Medicaid Other

## 2019-02-26 ENCOUNTER — Other Ambulatory Visit: Payer: Self-pay

## 2019-02-26 ENCOUNTER — Telehealth (HOSPITAL_COMMUNITY): Payer: Self-pay | Admitting: Cardiology

## 2019-02-26 NOTE — Telephone Encounter (Signed)
Opened in error

## 2019-02-26 NOTE — Telephone Encounter (Signed)
Patient called to report he was told to return for TEE on 2/25 by pre admissions and would need information for COVID test.  Reports his paperwork has his procedure date as 3/25 Apologized for the miscommunication however procedure is scheduled for 2/25 and we would be unable to get COVID test results in time for procedure. Will have to reschedule     Dear Vernon Blackburn, Vernon Blackburn are scheduled for a TEE (Transesophageal Echocardiogram) on Thursday, March 4th, 2021 with [name].  Please arrive at the [facility] (Main Entrance A) at [facility]: [address] [place],  [address] at 7:30am.   DIET: Nothing to eat or drink after midnight except a sip of water with medications (see medication instructions below)  Medication Instructions: Hold All morning medications on the day of your procedure  You will need to continue your anticoagulant after your procedure until you are told by your  Provider that it is safe to stop     You will need a pre procedure COVID test     WHEN:  March 1st, 2021 anytime between 9am-3pm WHERE: [facility]                  [place] [address]  This is a drive thru testing site, you will remain in your car. Be sure to get in the line FOR PROCEDURES Once you have been swabbed you will need to remain home in quarantine until you return for your procedure.   You must have a responsible person to drive you home and stay in the waiting area during your procedure. Failure to do so could result in cancellation.  Bring your insurance cards.  *Special Note: Every effort is made to have your procedure done on time. Occasionally there are emergencies that occur at the hospital that may cause delays. Please be patient if a delay does occur.

## 2019-03-03 ENCOUNTER — Other Ambulatory Visit (HOSPITAL_COMMUNITY)
Admission: RE | Admit: 2019-03-03 | Discharge: 2019-03-03 | Disposition: A | Discharge status: Home / Self Care | Payer: Medicaid Other | Source: Ambulatory Visit | Attending: Cardiology | Admitting: Cardiology

## 2019-03-03 DIAGNOSIS — Z01812 Encounter for preprocedural laboratory examination: Secondary | ICD-10-CM | POA: Insufficient documentation

## 2019-03-03 DIAGNOSIS — Z20822 Contact with and (suspected) exposure to covid-19: Secondary | ICD-10-CM | POA: Insufficient documentation

## 2019-03-03 LAB — SARS CORONAVIRUS 2 (TAT 6-24 HRS): SARS Coronavirus 2: NEGATIVE

## 2019-03-06 ENCOUNTER — Encounter (HOSPITAL_COMMUNITY): Payer: Self-pay | Admitting: Vascular Surgery

## 2019-03-06 ENCOUNTER — Other Ambulatory Visit: Payer: Self-pay

## 2019-03-06 ENCOUNTER — Ambulatory Visit (HOSPITAL_COMMUNITY): Payer: Medicaid Other | Admitting: Certified Registered"

## 2019-03-06 ENCOUNTER — Ambulatory Visit (HOSPITAL_COMMUNITY)
Admission: RE | Admit: 2019-03-06 | Discharge: 2019-03-06 | Disposition: A | Discharge status: Home / Self Care | Payer: Medicaid Other | Attending: Cardiology | Admitting: Cardiology

## 2019-03-06 ENCOUNTER — Encounter (HOSPITAL_COMMUNITY)
Admission: RE | Disposition: A | Discharge status: Home / Self Care | Payer: Self-pay | Source: Home / Self Care | Attending: Cardiology

## 2019-03-06 ENCOUNTER — Encounter (HOSPITAL_COMMUNITY): Payer: Self-pay | Admitting: Cardiology

## 2019-03-06 ENCOUNTER — Ambulatory Visit (HOSPITAL_BASED_OUTPATIENT_CLINIC_OR_DEPARTMENT_OTHER)
Admission: RE | Admit: 2019-03-06 | Discharge: 2019-03-06 | Disposition: A | Discharge status: Home / Self Care | Payer: Medicaid Other | Source: Ambulatory Visit | Attending: Cardiology | Admitting: Cardiology

## 2019-03-06 DIAGNOSIS — I5022 Chronic systolic (congestive) heart failure: Secondary | ICD-10-CM | POA: Diagnosis not present

## 2019-03-06 DIAGNOSIS — M109 Gout, unspecified: Secondary | ICD-10-CM | POA: Diagnosis not present

## 2019-03-06 DIAGNOSIS — N186 End stage renal disease: Secondary | ICD-10-CM | POA: Insufficient documentation

## 2019-03-06 DIAGNOSIS — Z79899 Other long term (current) drug therapy: Secondary | ICD-10-CM | POA: Insufficient documentation

## 2019-03-06 DIAGNOSIS — I340 Nonrheumatic mitral (valve) insufficiency: Secondary | ICD-10-CM | POA: Diagnosis present

## 2019-03-06 DIAGNOSIS — J45909 Unspecified asthma, uncomplicated: Secondary | ICD-10-CM | POA: Insufficient documentation

## 2019-03-06 DIAGNOSIS — Z7982 Long term (current) use of aspirin: Secondary | ICD-10-CM | POA: Diagnosis not present

## 2019-03-06 DIAGNOSIS — I420 Dilated cardiomyopathy: Secondary | ICD-10-CM | POA: Insufficient documentation

## 2019-03-06 DIAGNOSIS — I132 Hypertensive heart and chronic kidney disease with heart failure and with stage 5 chronic kidney disease, or end stage renal disease: Secondary | ICD-10-CM | POA: Diagnosis not present

## 2019-03-06 DIAGNOSIS — Z8249 Family history of ischemic heart disease and other diseases of the circulatory system: Secondary | ICD-10-CM | POA: Insufficient documentation

## 2019-03-06 DIAGNOSIS — Z8673 Personal history of transient ischemic attack (TIA), and cerebral infarction without residual deficits: Secondary | ICD-10-CM | POA: Insufficient documentation

## 2019-03-06 HISTORY — PX: TEE WITHOUT CARDIOVERSION: SHX5443

## 2019-03-06 SURGERY — ECHOCARDIOGRAM, TRANSESOPHAGEAL
Anesthesia: Monitor Anesthesia Care

## 2019-03-06 MED ORDER — PROPOFOL 500 MG/50ML IV EMUL
INTRAVENOUS | Status: DC | PRN
  Administered 2019-03-06: 150 ug/kg/min via INTRAVENOUS

## 2019-03-06 MED ORDER — PROPOFOL 10 MG/ML IV BOLUS
INTRAVENOUS | Status: DC | PRN
  Administered 2019-03-06 (×2): 50 mg via INTRAVENOUS

## 2019-03-06 MED ORDER — SODIUM CHLORIDE 0.9 % IV SOLN: INTRAVENOUS | Status: DC

## 2019-03-06 MED ORDER — LIDOCAINE 2% (20 MG/ML) 5 ML SYRINGE
INTRAMUSCULAR | Status: DC | PRN
  Administered 2019-03-06: 80 mg via INTRAVENOUS

## 2019-03-06 NOTE — Interval H&P Note (Signed)
History and Physical Interval Note:  03/06/2019 9:16 AM  [name]  has presented today for surgery, with the diagnosis of MITRAL REGURGITATION.  The various methods of treatment have been discussed with the patient and family. After consideration of risks, benefits and other options for treatment, the patient has consented to  Procedure(s): TRANSESOPHAGEAL ECHOCARDIOGRAM (TEE) (N/A) as a surgical intervention.  The patient's history has been reviewed, patient examined, no change in status, stable for surgery.  I have reviewed the patient's chart and labs.  Questions were answered to the patient's satisfaction.     Soul Hackman [employer]

## 2019-03-06 NOTE — Anesthesia Preprocedure Evaluation (Signed)
Anesthesia Evaluation  Patient identified by MRN, date of birth, ID band Patient awake    Reviewed: Allergy & Precautions, NPO status , Patient's Chart, lab work & pertinent test results  Airway Mallampati: I  TM Distance: >3 FB Neck ROM: Full    Dental   Pulmonary sleep apnea ,    Pulmonary exam normal        Cardiovascular hypertension, Pt. on medications Normal cardiovascular exam+ Valvular Problems/Murmurs MR      Neuro/Psych    GI/Hepatic   Endo/Other    Renal/GU      Musculoskeletal   Abdominal   Peds  Hematology   Anesthesia Other Findings   Reproductive/Obstetrics                             Anesthesia Physical Anesthesia Plan  ASA: III  Anesthesia Plan: MAC   Post-op Pain Management:    Induction: Intravenous  PONV Risk Score and Plan: 1  Airway Management Planned: Nasal Cannula  Additional Equipment:   Intra-op Plan:   Post-operative Plan:   Informed Consent: I have reviewed the patients History and Physical, chart, labs and discussed the procedure including the risks, benefits and alternatives for the proposed anesthesia with the patient or authorized representative who has indicated his/her understanding and acceptance.       Plan Discussed with: CRNA and Surgeon  Anesthesia Plan Comments:         Anesthesia Quick Evaluation

## 2019-03-06 NOTE — Progress Notes (Signed)
Pt denies SOB and chest pain. Pt stated that he is under the care of [name], Cardiology and Dr. Burley Saver, PCP. Pt denies having a chest x ray in the last year. Pt made aware to stop taking vitamins, fish oil and herbal medications. Do not take any NSAIDs ie: Ibuprofen, Advil, Naproxen (Aleve), Motrin, BC and Goody Powder. Pt reminded to quarantine. Pt verbalized understanding of all pre-op instructions. PA, Anesthesiology, asked to review pt cardiac history.

## 2019-03-06 NOTE — Anesthesia Procedure Notes (Signed)
Procedure Name: MAC Date/Time: 03/06/2019 9:16 AM Performed by: Amadeo Garnet, CRNA Pre-anesthesia Checklist: Patient identified, Emergency Drugs available, Suction available and Patient being monitored Patient Re-evaluated:Patient Re-evaluated prior to induction Oxygen Delivery Method: Nasal cannula Preoxygenation: Pre-oxygenation with 100% oxygen Induction Type: IV induction Placement Confirmation: positive ETCO2 Dental Injury: Teeth and Oropharynx as per pre-operative assessment

## 2019-03-06 NOTE — Transfer of Care (Signed)
Immediate Anesthesia Transfer of Care Note  Patient: Vernon Blackburn  Procedure(s) Performed: TRANSESOPHAGEAL ECHOCARDIOGRAM (TEE) (N/A )  Patient Location: Endoscopy Unit  Anesthesia Type:MAC  Level of Consciousness: awake, alert  and oriented  Airway & Oxygen Therapy: Patient Spontanous Breathing  Post-op Assessment: Report given to RN, Post -op Vital signs reviewed and stable and Patient moving all extremities  Post vital signs: Reviewed and stable  Last Vitals:  Vitals Value Taken Time  BP 100/74 03/06/19 0938  Temp    Pulse 80 03/06/19 0939  Resp 18 03/06/19 0939  SpO2 99 % 03/06/19 0939  Vitals shown include unvalidated device data.  Last Pain:  Vitals:   03/06/19 0806  TempSrc: Oral  PainSc: 0-No pain         Complications: No apparent anesthesia complications

## 2019-03-06 NOTE — CV Procedure (Signed)
Procedure: TEE  Sedation: Per anesthesiology  Indication: Stenosis of repaired mitral valve.   Findings: Please see echo section for full report.  Normal LV size with moderate LV hypertrophy.  EF 45%, diffuse hypokinesis.  Normal RV size with mildly decreased systolic function.  Mild right atrial enlargement.  Mild left atrial enlargement.  No LA appendage thrombus.  Trivial TR, peak RV-RA gradient 24 mmHg.  Trileaflet aortic valve with no stenosis or regurgitation.  The mitral valve is s/p repair. There was mild eccentric mitral regurgitation. Mean gradient across the mitral valve was 4 mmHg with relatively short PHT.  I do not think that clinically significant mitral stenosis was present. Normal caliber aorta with minimal plaque.  No evidence for ASD/PFO by color doppler.   Impression: Stable repaired mitral valve.   Vernon Blackburn 03/06/2019 9:35 AM

## 2019-03-06 NOTE — Anesthesia Postprocedure Evaluation (Signed)
Anesthesia Post Note  Patient: Vernon Blackburn  Procedure(s) Performed: TRANSESOPHAGEAL ECHOCARDIOGRAM (TEE) (N/A )     Patient location during evaluation: PACU Anesthesia Type: MAC Level of consciousness: awake and alert Pain management: pain level controlled Vital Signs Assessment: post-procedure vital signs reviewed and stable Respiratory status: spontaneous breathing, nonlabored ventilation, respiratory function stable and patient connected to nasal cannula oxygen Cardiovascular status: stable and blood pressure returned to baseline Postop Assessment: no apparent nausea or vomiting Anesthetic complications: no    Last Vitals:  Vitals:   03/06/19 0947 03/06/19 0956  BP: 103/83 112/87  Pulse: 67 63  Resp: 15 18  Temp:    SpO2: 98% 99%    Last Pain:  Vitals:   03/06/19 0956  TempSrc:   PainSc: 0-No pain                 Donna Snooks DAVID

## 2019-03-07 ENCOUNTER — Other Ambulatory Visit (HOSPITAL_COMMUNITY)
Admission: RE | Admit: 2019-03-07 | Discharge: 2019-03-07 | Disposition: A | Discharge status: Home / Self Care | Payer: Medicaid Other | Source: Ambulatory Visit | Attending: Vascular Surgery | Admitting: Vascular Surgery

## 2019-03-07 DIAGNOSIS — Z01812 Encounter for preprocedural laboratory examination: Secondary | ICD-10-CM | POA: Insufficient documentation

## 2019-03-07 DIAGNOSIS — Z20822 Contact with and (suspected) exposure to covid-19: Secondary | ICD-10-CM | POA: Insufficient documentation

## 2019-03-07 LAB — SARS CORONAVIRUS 2 (TAT 6-24 HRS): SARS Coronavirus 2: NEGATIVE

## 2019-03-07 NOTE — Anesthesia Preprocedure Evaluation (Addendum)
Anesthesia Evaluation  Patient identified by MRN, date of birth, ID band Patient awake    Reviewed: Allergy & Precautions, NPO status , Patient's Chart, lab work & pertinent test results  Airway Mallampati: II  TM Distance: >3 FB Neck ROM: Full    Dental  (+) Teeth Intact, Dental Advisory Given   Pulmonary    breath sounds clear to auscultation       Cardiovascular hypertension,  Rhythm:Regular Rate:Normal     Neuro/Psych    GI/Hepatic   Endo/Other    Renal/GU      Musculoskeletal   Abdominal   Peds  Hematology   Anesthesia Other Findings   Reproductive/Obstetrics                            Anesthesia Physical Anesthesia Plan  ASA: III  Anesthesia Plan: MAC   Post-op Pain Management:    Induction: Intravenous  PONV Risk Score and Plan: Ondansetron and Propofol infusion  Airway Management Planned: Natural Airway and Simple Face Mask  Additional Equipment:   Intra-op Plan:   Post-operative Plan:   Informed Consent: I have reviewed the patients History and Physical, chart, labs and discussed the procedure including the risks, benefits and alternatives for the proposed anesthesia with the patient or authorized representative who has indicated his/her understanding and acceptance.     Dental advisory given  Plan Discussed with: CRNA and Anesthesiologist  Anesthesia Plan Comments: (Follows with cardiology for hx of long-standing, poorly-controlled HTN, chronic systolic CHF and severe mitral regurgitation s/p MV repair 2018. Last seen by [name] 02/17/18. Per note, "He was admitted in 2/21 to [facility] with worsening renal function and HD was started.  Echo at [facility] in 2/21 showed EF 40-45%, mild LV dilation, moderate LVH, "severe mitral stenosis" with mean gradient 7 mmHg and eccentric mild MR.  Head imagining showed an old CVA.  He is now wearing a 2 wk Zio patch to assess for  atrial fibrillation. He is currently stable at home.  He is getting HD MWF.  He is tolerating HD reasonably well though BP-active meds have been cut back.  Breathing is much better since starting HD, no dyspnea walking on flat ground or with usual ADLs.  No chest pain.  Weight is down 35 lbs.  No orthopnea/PND."  [name] recommended TEE to further eval his previously repaired mitral valve. TEE done 03/07/19 showed EF 45%, mild MR - overall stable repaired mitral valve. Full results below.  Labs from 02/18/19 reviewed, consistent with ESRD. Anemic with Hgb 9.0.  Will need DOS labs and eval.  EKG 02/18/19: Normal sinus rhythm. Rate 83. Prolonged QT (QTc) 481. No significant change since last tracing  TTE 03/06/19: Normal LV size with moderate LV hypertrophy.  EF 45%, diffuse hypokinesis.  Normal RV size with mildly decreased systolic function.  Mild right atrial enlargement.  Mild left atrial enlargement.  No LA appendage thrombus.  Trivial TR, peak RV-RA gradient 24 mmHg.  Trileaflet aortic valve with no stenosis or regurgitation.  The mitral valve is s/p repair. There was mild eccentric mitral regurgitation. Mean gradient across the mitral valve was 4 mmHg with relatively short PHT.  I do not think that clinically significant mitral stenosis was present. Normal caliber aorta with minimal plaque.  No evidence for ASD/PFO by color doppler.   Impression: Stable repaired mitral valve.   R/L Heart cath 06/07/2016: 1. No significant coronary disease.  Minimal contrast used  with angiography.  2. Severely elevated right and left heart filling pressures.  PCWP elevated more than LVEDP.  3. Preserved cardiac output.  4. Pulmonary venous hypertension.  )       Anesthesia Quick Evaluation

## 2019-03-07 NOTE — Progress Notes (Signed)
Anesthesia Chart Review: Same day workup  Follows with cardiology for hx of long-standing, poorly-controlled HTN, chronic systolic CHF and severe mitral regurgitation s/p MV repair 2018. Last seen by [name] 02/17/18. Per note, "He was admitted in 2/21 to [facility] with worsening renal function and HD was started.  Echo at [facility] in 2/21 showed EF 40-45%, mild LV dilation, moderate LVH, "severe mitral stenosis" with mean gradient 7 mmHg and eccentric mild MR.  Head imagining showed an old CVA.  He is now wearing a 2 wk Zio patch to assess for atrial fibrillation. He is currently stable at home.  He is getting HD MWF.  He is tolerating HD reasonably well though BP-active meds have been cut back.  Breathing is much better since starting HD, no dyspnea walking on flat ground or with usual ADLs.  No chest pain.  Weight is down 35 lbs.  No orthopnea/PND."  [name] recommended TEE to further eval his previously repaired mitral valve. TEE done 03/07/19 showed EF 45%, mild MR - overall stable repaired mitral valve. Full results below.  Labs from 02/18/19 reviewed, consistent with ESRD. Anemic with Hgb 9.0.  Will need DOS labs and eval.  EKG 02/18/19: Normal sinus rhythm. Rate 83. Prolonged QT (QTc) 481. No significant change since last tracing  TTE 03/06/19: Normal LV size with moderate LV hypertrophy.  EF 45%, diffuse hypokinesis.  Normal RV size with mildly decreased systolic function.  Mild right atrial enlargement.  Mild left atrial enlargement.  No LA appendage thrombus.  Trivial TR, peak RV-RA gradient 24 mmHg.  Trileaflet aortic valve with no stenosis or regurgitation.  The mitral valve is s/p repair. There was mild eccentric mitral regurgitation. Mean gradient across the mitral valve was 4 mmHg with relatively short PHT.  I do not think that clinically significant mitral stenosis was present. Normal caliber aorta with minimal plaque.  No evidence for ASD/PFO by color doppler.   Impression: Stable  repaired mitral valve.   R/L Heart cath 06/07/2016: 1. No significant coronary disease.  Minimal contrast used with angiography.  2. Severely elevated right and left heart filling pressures.  PCWP elevated more than LVEDP.  3. Preserved cardiac output.  4. Pulmonary venous hypertension.   Wynonia Musty [facility] Short Stay Center/Anesthesiology Phone [phone] 03/07/2019 9:29 AM

## 2019-03-10 ENCOUNTER — Ambulatory Visit (HOSPITAL_COMMUNITY): Payer: Medicaid Other | Admitting: Physician Assistant

## 2019-03-10 ENCOUNTER — Encounter (HOSPITAL_COMMUNITY): Payer: Self-pay | Admitting: Vascular Surgery

## 2019-03-10 ENCOUNTER — Ambulatory Visit (HOSPITAL_COMMUNITY)
Admission: RE | Admit: 2019-03-10 | Discharge: 2019-03-10 | Disposition: A | Discharge status: Home / Self Care | Payer: Medicaid Other | Attending: Vascular Surgery | Admitting: Vascular Surgery

## 2019-03-10 ENCOUNTER — Other Ambulatory Visit: Payer: Self-pay

## 2019-03-10 ENCOUNTER — Encounter (HOSPITAL_COMMUNITY)
Admission: RE | Disposition: A | Discharge status: Home / Self Care | Payer: Self-pay | Source: Home / Self Care | Attending: Vascular Surgery

## 2019-03-10 DIAGNOSIS — G4730 Sleep apnea, unspecified: Secondary | ICD-10-CM | POA: Diagnosis not present

## 2019-03-10 DIAGNOSIS — I272 Pulmonary hypertension, unspecified: Secondary | ICD-10-CM | POA: Insufficient documentation

## 2019-03-10 DIAGNOSIS — I5022 Chronic systolic (congestive) heart failure: Secondary | ICD-10-CM | POA: Insufficient documentation

## 2019-03-10 DIAGNOSIS — Z79899 Other long term (current) drug therapy: Secondary | ICD-10-CM | POA: Insufficient documentation

## 2019-03-10 DIAGNOSIS — I872 Venous insufficiency (chronic) (peripheral): Secondary | ICD-10-CM | POA: Insufficient documentation

## 2019-03-10 DIAGNOSIS — N184 Chronic kidney disease, stage 4 (severe): Secondary | ICD-10-CM | POA: Insufficient documentation

## 2019-03-10 DIAGNOSIS — I429 Cardiomyopathy, unspecified: Secondary | ICD-10-CM | POA: Insufficient documentation

## 2019-03-10 DIAGNOSIS — M109 Gout, unspecified: Secondary | ICD-10-CM | POA: Insufficient documentation

## 2019-03-10 DIAGNOSIS — I132 Hypertensive heart and chronic kidney disease with heart failure and with stage 5 chronic kidney disease, or end stage renal disease: Secondary | ICD-10-CM | POA: Diagnosis present

## 2019-03-10 DIAGNOSIS — Z7982 Long term (current) use of aspirin: Secondary | ICD-10-CM | POA: Insufficient documentation

## 2019-03-10 DIAGNOSIS — N185 Chronic kidney disease, stage 5: Secondary | ICD-10-CM

## 2019-03-10 DIAGNOSIS — J45909 Unspecified asthma, uncomplicated: Secondary | ICD-10-CM | POA: Insufficient documentation

## 2019-03-10 HISTORY — PX: AV FISTULA PLACEMENT: SHX1204

## 2019-03-10 LAB — POCT I-STAT, CHEM 8
BUN: 69 mg/dL — ABNORMAL HIGH (ref 6–20)
Calcium, Ion: 0.83 mmol/L — CL (ref 1.15–1.40)
Chloride: 105 mmol/L (ref 98–111)
Creatinine, Ser: 13.1 mg/dL — ABNORMAL HIGH (ref 0.61–1.24)
Glucose, Bld: 83 mg/dL (ref 70–99)
HCT: 29 % — ABNORMAL LOW (ref 39.0–52.0)
Hemoglobin: 9.9 g/dL — ABNORMAL LOW (ref 13.0–17.0)
Potassium: 4 mmol/L (ref 3.5–5.1)
Sodium: 140 mmol/L (ref 135–145)
TCO2: 25 mmol/L (ref 22–32)

## 2019-03-10 SURGERY — ARTERIOVENOUS (AV) FISTULA CREATION
Anesthesia: Monitor Anesthesia Care | Site: Arm Upper | Laterality: Left

## 2019-03-10 MED ORDER — MIDAZOLAM HCL 5 MG/5ML IJ SOLN
INTRAMUSCULAR | Status: DC | PRN
  Administered 2019-03-10: 1 mg via INTRAVENOUS

## 2019-03-10 MED ORDER — FENTANYL CITRATE (PF) 250 MCG/5ML IJ SOLN
INTRAMUSCULAR | Status: AC
  Filled 2019-03-10: qty 5

## 2019-03-10 MED ORDER — MIDAZOLAM HCL 2 MG/2ML IJ SOLN
INTRAMUSCULAR | Status: AC
  Filled 2019-03-10: qty 2

## 2019-03-10 MED ORDER — PHENYLEPHRINE HCL-NACL 10-0.9 MG/250ML-% IV SOLN
INTRAVENOUS | Status: DC | PRN
  Administered 2019-03-10: 25 ug/min via INTRAVENOUS

## 2019-03-10 MED ORDER — DEXAMETHASONE SODIUM PHOSPHATE 10 MG/ML IJ SOLN
INTRAMUSCULAR | Status: AC
  Filled 2019-03-10: qty 1

## 2019-03-10 MED ORDER — LIDOCAINE HCL 1 % IJ SOLN
INTRAMUSCULAR | Status: DC | PRN
  Administered 2019-03-10: 5 mL

## 2019-03-10 MED ORDER — CARVEDILOL 3.125 MG PO TABS
6.2500 mg | ORAL_TABLET | Freq: Two times a day (BID) | ORAL | Status: DC
  Filled 2019-03-10: qty 2

## 2019-03-10 MED ORDER — PROPOFOL 500 MG/50ML IV EMUL
INTRAVENOUS | Status: DC | PRN
  Administered 2019-03-10: 75 ug/kg/min via INTRAVENOUS

## 2019-03-10 MED ORDER — HYDROCODONE-ACETAMINOPHEN 5-325 MG PO TABS: 1.0000 | ORAL_TABLET | Freq: Four times a day (QID) | ORAL | 0 refills | Status: DC | PRN

## 2019-03-10 MED ORDER — 0.9 % SODIUM CHLORIDE (POUR BTL) OPTIME
TOPICAL | Status: DC | PRN
  Administered 2019-03-10: 1000 mL

## 2019-03-10 MED ORDER — SODIUM CHLORIDE 0.9 % IV SOLN: INTRAVENOUS | Status: DC

## 2019-03-10 MED ORDER — SODIUM CHLORIDE 0.9 % IV SOLN
INTRAVENOUS | Status: AC
  Filled 2019-03-10: qty 1.2

## 2019-03-10 MED ORDER — PROPOFOL 10 MG/ML IV BOLUS
INTRAVENOUS | Status: AC
  Filled 2019-03-10: qty 20

## 2019-03-10 MED ORDER — SODIUM CHLORIDE 0.9 % IV SOLN: INTRAVENOUS | Status: DC | PRN

## 2019-03-10 MED ORDER — HEPARIN SODIUM (PORCINE) 1000 UNIT/ML IJ SOLN
INTRAMUSCULAR | Status: DC | PRN
  Administered 2019-03-10: 3000 [IU] via INTRAVENOUS

## 2019-03-10 MED ORDER — FENTANYL CITRATE (PF) 100 MCG/2ML IJ SOLN
INTRAMUSCULAR | Status: DC | PRN
  Administered 2019-03-10: 25 ug via INTRAVENOUS
  Administered 2019-03-10: 50 ug via INTRAVENOUS

## 2019-03-10 MED ORDER — SODIUM CHLORIDE 0.9 % IV SOLN
INTRAVENOUS | Status: DC | PRN
  Administered 2019-03-10: 500 mL

## 2019-03-10 MED ORDER — CARVEDILOL 12.5 MG PO TABS
ORAL_TABLET | ORAL | Status: AC
  Administered 2019-03-10: 6.25 mg via ORAL
  Filled 2019-03-10: qty 1

## 2019-03-10 MED ORDER — PAPAVERINE HCL 30 MG/ML IJ SOLN
INTRAMUSCULAR | Status: AC
  Filled 2019-03-10: qty 2

## 2019-03-10 MED ORDER — CEFAZOLIN SODIUM-DEXTROSE 2-4 GM/100ML-% IV SOLN
2.0000 g | INTRAVENOUS | Status: AC
  Administered 2019-03-10: 2 g via INTRAVENOUS
  Filled 2019-03-10: qty 100

## 2019-03-10 MED ORDER — LIDOCAINE-EPINEPHRINE 0.5 %-1:200000 IJ SOLN
INTRAMUSCULAR | Status: AC
  Filled 2019-03-10: qty 1

## 2019-03-10 MED ORDER — LIDOCAINE HCL (CARDIAC) PF 100 MG/5ML IV SOSY
PREFILLED_SYRINGE | INTRAVENOUS | Status: DC | PRN
  Administered 2019-03-10: 40 mg via INTRAVENOUS

## 2019-03-10 MED ORDER — CHLORHEXIDINE GLUCONATE 4 % EX LIQD: 60.0000 mL | Freq: Once | CUTANEOUS | Status: DC

## 2019-03-10 SURGICAL SUPPLY — 36 items
ARMBAND PINK RESTRICT EXTREMIT (MISCELLANEOUS) ×2 IMPLANT
CANISTER SUCT 3000ML PPV (MISCELLANEOUS) ×2 IMPLANT
CLIP VESOCCLUDE MED 6/CT (CLIP) ×2 IMPLANT
CLIP VESOCCLUDE SM WIDE 6/CT (CLIP) ×2 IMPLANT
COVER PROBE W GEL 5X96 (DRAPES) ×2 IMPLANT
COVER WAND RF STERILE (DRAPES) IMPLANT
DECANTER SPIKE VIAL GLASS SM (MISCELLANEOUS) ×2 IMPLANT
DERMABOND ADVANCED (GAUZE/BANDAGES/DRESSINGS) ×1
DERMABOND ADVANCED .7 DNX12 (GAUZE/BANDAGES/DRESSINGS) ×1 IMPLANT
DRSG TEGADERM 2-3/8X2-3/4 SM (GAUZE/BANDAGES/DRESSINGS) ×2 IMPLANT
ELECT REM PT RETURN 9FT ADLT (ELECTROSURGICAL) ×2
ELECTRODE REM PT RTRN 9FT ADLT (ELECTROSURGICAL) ×1 IMPLANT
GLOVE BIO SURGEON STRL SZ7.5 (GLOVE) ×2 IMPLANT
GLOVE BIOGEL PI IND STRL 6.5 (GLOVE) ×1 IMPLANT
GLOVE BIOGEL PI IND STRL 8 (GLOVE) ×1 IMPLANT
GLOVE BIOGEL PI INDICATOR 6.5 (GLOVE) ×1
GLOVE BIOGEL PI INDICATOR 8 (GLOVE) ×1
GLOVE ECLIPSE 6.5 STRL STRAW (GLOVE) ×2 IMPLANT
GOWN STRL REUS W/ TWL LRG LVL3 (GOWN DISPOSABLE) ×2 IMPLANT
GOWN STRL REUS W/ TWL XL LVL3 (GOWN DISPOSABLE) ×2 IMPLANT
GOWN STRL REUS W/TWL LRG LVL3 (GOWN DISPOSABLE) ×4
GOWN STRL REUS W/TWL XL LVL3 (GOWN DISPOSABLE) ×4
HEMOSTAT SPONGE AVITENE ULTRA (HEMOSTASIS) IMPLANT
KIT BASIN OR (CUSTOM PROCEDURE TRAY) ×2 IMPLANT
KIT TURNOVER KIT B (KITS) ×2 IMPLANT
NS IRRIG 1000ML POUR BTL (IV SOLUTION) ×2 IMPLANT
PACK CV ACCESS (CUSTOM PROCEDURE TRAY) ×2 IMPLANT
PAD ARMBOARD 7.5X6 YLW CONV (MISCELLANEOUS) ×4 IMPLANT
SUT MNCRL AB 4-0 PS2 18 (SUTURE) ×2 IMPLANT
SUT PROLENE 6 0 BV (SUTURE) ×2 IMPLANT
SUT PROLENE 7 0 BV 1 (SUTURE) IMPLANT
SUT VIC AB 3-0 SH 27 (SUTURE) ×2
SUT VIC AB 3-0 SH 27X BRD (SUTURE) ×1 IMPLANT
TOWEL GREEN STERILE (TOWEL DISPOSABLE) ×2 IMPLANT
UNDERPAD 30X30 (UNDERPADS AND DIAPERS) ×2 IMPLANT
WATER STERILE IRR 1000ML POUR (IV SOLUTION) ×2 IMPLANT

## 2019-03-10 NOTE — H&P (Signed)
Patient name: Vernon Blackburn MRN: [id number] DOB: 1965/10/11 Sex: male  REASON FOR CONSULT: New AV access evaluation for dialysis  HPI:  Vernon Blackburn is a 54 y.o. male, with stage IV chronic kidney disease, chronic systolic heart failure, hypertension, mitral regurgitation that presents for new AV fistula access evaluation. Patient states that his kidney disease is likely from hypertension and congestive heart failure. He would like to have a kidney transplant. He has never had any access in his upper extremities before. He is right-handed. 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 No chest wall implants otherwise.      Past Medical History:  Diagnosis Date  . Allergy   . Anemia, chronic disease   . Arthritis    Gout  . Asthma   . Cardiomyopathy [facility])    cardiologist is [employer]? [name], lov in office per patient was last year in 2019   . CHF (congestive heart failure) ([place])    a. 01/2016: echo showing EF of 20-25%, no WMA, severe MR, and PA Peak Pressure of 52 mm Hg.   Marland Kitchen Chronic systolic HF (heart failure) ([place])   . CKD (chronic kidney disease)    Dr [name] , stage 4 ; per patient lov in office march 2020 , per patient at this last visit he was not approved as a candidate for kidney transplant because his kidney fx has improved to greater than 20  . Elbow pain, left   . Heart murmur   . History of kidney stones   . Hypertension   . Knee pain, bilateral   . Left ankle pain 11/04/2010   Gout  . Leg pain, bilateral   . Peripheral edema    Bilateral lower extremity   . Pneumonia    hx  . Pulmonary HTN ([place])   . S/P minimally invasive mitral valve repair 06/29/2016   30 mm [name] via right mini thoracotomy approach  . Severe mitral regurgitation   . Sleep apnea    hx bariatric surgery 3 yrs ago lost 166 lbs  . Syncope and collapse 07/14/2016  . Venous insufficiency         Past Surgical History:  Procedure Laterality Date  .  CYSTOSCOPY/URETEROSCOPY/HOLMIUM LASER/STENT PLACEMENT Right 07/17/2018   Procedure: CYSTOSCOPY/RETROGRADE/URETEROSCOPY/HOLMIUM LASER/STENT PLACEMENT/ CYSTOLITHALOPAXY; Surgeon: Ceasar Mons, MD; Location: [facility]; Service: Urology; Laterality: Right;  . GASTRIC BYPASS  2014  . MENISCUS REPAIR Right 05/2008   right knee  . MITRAL VALVE REPAIR Right 06/29/2016   Procedure: MINIMALLY INVASIVE MITRAL VALVE REPAIR (MVR); Surgeon: [name], MD; Location: [facility]; Service: Open Heart Surgery; Laterality: Right;  . RIGHT/LEFT HEART CATH AND CORONARY ANGIOGRAPHY N/A 06/07/2016   Procedure: Right/Left Heart Cath and Coronary Angiography; Surgeon: Larey Dresser, MD; Location: [facility] CV LAB; Service: Cardiovascular; Laterality: N/A;  . TEE WITHOUT CARDIOVERSION N/A 05/04/2016   Procedure: TRANSESOPHAGEAL ECHOCARDIOGRAM (TEE); Surgeon: Larey Dresser, MD; Location: [facility] ENDOSCOPY; Service: Cardiovascular; Laterality: N/A;  . TEE WITHOUT CARDIOVERSION N/A 06/29/2016   Procedure: TRANSESOPHAGEAL ECHOCARDIOGRAM (TEE); Surgeon: [name], MD; Location: [facility]; Service: Open Heart Surgery; Laterality: N/A;        Family History  Problem Relation Age of Onset  . Diabetes Mother   . Hypertension Mother   . Heart failure Mother   . Colon cancer Neg Hx   . Esophageal cancer Neg Hx   . Rectal cancer Neg Hx   . Stomach cancer Neg Hx  SOCIAL HISTORY:  Social History        Socioeconomic History  . Marital status: Married    Spouse name: Not on file  . Number of children: Not on file  . Years of education: Not on file  . Highest education level: Not on file  Occupational History  . Not on file  Social Needs  . Financial resource strain: Not on file  . Food insecurity    Worry: Not on file    Inability: Not on file  . Transportation needs    Medical: Not on file    Non-medical: Not on file  Tobacco Use  . Smoking status: Never Smoker  . Smokeless tobacco: Never Used   Substance and Sexual Activity  . Alcohol use: Yes    Comment: Once a month   . Drug use: No  . Sexual activity: Not on file  Lifestyle  . Physical activity    Days per week: Not on file    Minutes per session: Not on file  . Stress: Not on file  Relationships  . Social Herbalist on phone: Not on file    Gets together: Not on file    Attends religious service: Not on file    Active member of club or organization: Not on file    Attends meetings of clubs or organizations: Not on file    Relationship status: Not on file  . Intimate partner violence    Fear of current or ex partner: Not on file    Emotionally abused: Not on file    Physically abused: Not on file    Forced sexual activity: Not on file  Other Topics Concern  . Not on file  Social History Narrative  . Not on file   No Known Allergies        Current Outpatient Medications  Medication Sig Dispense Refill  . acetaminophen (TYLENOL) 325 MG tablet Take 650 mg by mouth every 6 (six) hours as needed. Took 2 tabs for headache yesterday 07/16/2018    . albuterol (PROVENTIL HFA;VENTOLIN HFA) 108 (90 Base) MCG/ACT inhaler Inhale 2 puffs into the lungs every 6 (six) hours as needed for wheezing or shortness of breath. 1 Inhaler 2  . amLODipine (NORVASC) 10 MG tablet TAKE 1 TABLET BY MOUTH ONCE DAILY (Patient taking differently: Take 10 mg by mouth daily. ) 30 tablet 5  . aspirin EC 81 MG tablet Take 1 tablet (81 mg total) by mouth daily. 90 tablet 3  . calcitRIOL (ROCALTROL) 0.25 MCG capsule Take 0.25 mcg by mouth daily.    . carvedilol (COREG) 25 MG tablet TAKE 1 TABLET BY MOUTH TWICE DAILY *NEED TO MAKE AN APPOINTMENT* 60 tablet 0  . cetirizine (ZYRTEC) 10 MG tablet Take 10 mg by mouth daily.    . Ferrous Sulfate (IRON) 325 (65 Fe) MG TABS Take 325 mg by mouth 3 (three) times daily.    . fluticasone (FLONASE) 50 MCG/ACT nasal spray Place 1 spray into both nostrils 2 (two) times daily as needed for allergies or  rhinitis.    . furosemide (LASIX) 40 MG tablet Take 1 tablet (40 mg total) by mouth 2 (two) times daily. Please cancel all previous orders for current medication. Change in dosage or pill size. 70 tablet 6  . hydrALAZINE (APRESOLINE) 100 MG tablet Take 1 tablet (100 mg total) by mouth 3 (three) times daily. 270 tablet 3  . isosorbide mononitrate (IMDUR) 60 MG 24 hr tablet Take  60 mg by mouth daily.    . Multiple Vitamin (MULTIVITAMIN WITH MINERALS) TABS tablet Take 1 tablet by mouth 2 (two) times daily.     . potassium chloride SA (K-DUR) 20 MEQ tablet Take 2 tablets (40 mEq total) by mouth daily. 180 tablet 1  . sildenafil (REVATIO) 20 MG tablet Take 20 mg by mouth as needed.    . tamsulosin (FLOMAX) 0.4 MG CAPS capsule Take 0.4 mg by mouth daily.    Marland Kitchen VYVANSE 60 MG capsule Take 60 mg by mouth daily.   0            Current Facility-Administered Medications  Medication Dose Route Frequency Provider Last Rate Last Dose  . 0.9 % sodium chloride infusion 500 mL Intravenous Once Cirigliano, Vito V, DO     REVIEW OF SYSTEMS:  [X]  denotes positive finding, [ ]  denotes negative finding  Cardiac  Comments:  Chest pain or chest pressure:    Shortness of breath upon exertion: x   Short of breath when lying flat:    Irregular heart rhythm:        Vascular    Pain in calf, thigh, or hip brought on by ambulation:    Pain in feet at night that wakes you up from your sleep:     Blood clot in your veins:    Leg swelling:         Pulmonary    Oxygen at home:    Productive cough:     Wheezing:  x       Neurologic    Sudden weakness in arms or legs:     Sudden numbness in arms or legs:     Sudden onset of difficulty speaking or slurred speech:    Temporary loss of vision in one eye:     Problems with dizziness:         Gastrointestinal    Blood in stool:     Vomited blood:         Genitourinary    Burning when urinating:     Blood in urine:        Psychiatric    Major depression:          Hematologic    Bleeding problems:    Problems with blood clotting too easily:        Skin    Rashes or ulcers:        Constitutional    Fever or chills:    PHYSICAL EXAM:     Vitals:   10/29/18 0943  BP: 132/90  Pulse: 73  Resp: 16  Temp: 97.8 F (36.6 C)  TempSrc: Temporal  SpO2: 99%  Weight: 215 lb (97.5 kg)  Height: 5\' 9"  (1.753 m)   GENERAL: The patient is a well-nourished male, in no acute distress. The vital signs are documented above.  CARDIAC: There is a regular rate and rhythm.  VASCULAR:  2+ palpable radial pulse bilateral upper extremity  2+ palpable brachial pulse bilateral upper extremity  PULMONARY: There is good air exchange bilaterally without wheezing or rales.  ABDOMEN: Soft and non-tender with normal pitched bowel sounds.  MUSCULOSKELETAL: There are no major deformities or cyanosis.  NEUROLOGIC: No focal weakness or paresthesias are detected.  SKIN: There are no ulcers or rashes noted.  PSYCHIATRIC: The patient has a normal affect.  DATA:  Vein mapping shows small surface veins bilaterally  Assessment/Plan:  54 year old male with stage IV chronic kidney disease secondary to hypertension and  congestive heart failure presents for new AV fistula access evaluation. Unfortunately he has no usable superficial surface veins based on vein mapping. It looks like tentatively he would be a candidate for graft in his left arm. Plan for left arm AVF vs graft placement today.    Marty Heck, MD  [facility]  Office: [phone]

## 2019-03-10 NOTE — Anesthesia Postprocedure Evaluation (Signed)
Anesthesia Post Note  Patient: Vernon Blackburn  Procedure(s) Performed: LEFT ARM BASILIC ARTERIOVENOUS (AV) FISTULA CREATION FIRST STAGE (Left Arm Upper)     Patient location during evaluation: PACU Anesthesia Type: MAC Level of consciousness: awake and alert Pain management: pain level controlled Vital Signs Assessment: post-procedure vital signs reviewed and stable Respiratory status: spontaneous breathing, nonlabored ventilation, respiratory function stable and patient connected to nasal cannula oxygen Cardiovascular status: stable and blood pressure returned to baseline Postop Assessment: no apparent nausea or vomiting Anesthetic complications: no    Last Vitals:  Vitals:   03/10/19 0920 03/10/19 0935  BP: (!) 133/102 (!) 134/99  Pulse: 80 72  Resp: 13 16  Temp:  36.4 C  SpO2: 100% 100%    Last Pain:  Vitals:   03/10/19 0935  TempSrc:   PainSc: 0-No pain                 Shavonna Corella COKER

## 2019-03-10 NOTE — Op Note (Signed)
OPERATIVE NOTE   PROCEDURE: 1. left first stage basilic vein transposition (brachiobasilic arteriovenous fistula) placement  PRE-OPERATIVE DIAGNOSIS: ESRD  POST-OPERATIVE DIAGNOSIS: same  SURGEON: Marty Heck, MD  ASSISTANT(S): Arlee Muslim, PA  ANESTHESIA: MAC  ESTIMATED BLOOD LOSS: Minimal  FINDING(S): 1.  Basilic vein: 8.5-2.7 mm, acceptable 2.  Brachial artery: 5 mm, disease free 3.  Venous outflow: palpable thrill  4.  Radial flow: palpable radial pulse  SPECIMEN(S):  none  INDICATIONS:   Vernon Blackburn is a 54 y.o. male who presents with ESRD and need for permanent hemodialysis access.  The patient is scheduled for left arm AVF vs graft.  After evaluation with ultrasound in the OR elected to place left arm basilic vein fistula.  The patient is aware the risks include but are not limited to: bleeding, infection, steal syndrome, nerve damage, ischemic monomelic neuropathy, failure to mature, and need for additional procedures.  The patient is aware of the risks of the procedure and elects to proceed forward.   DESCRIPTION: After full informed written consent was obtained from the patient, the patient was brought back to the operating room and placed supine upon the operating table.  Prior to induction, the patient received IV antibiotics.   After obtaining adequate anesthesia, the patient was then prepped and draped in the standard fashion for a left arm access procedure.  I turned my attention first to evaluating superficial veins with ultrasound.  He had a nice basilic vein that was larger than his vein mapping suggested.   Using SonoSite guidance, the location of the left basilic vein and brachial artery were marked just above the elbow.   At this point, I injected local anesthetic to obtain a field block of the antecubitum.  In total, I injected about 10 mL of 1% lidocaine without epinephrine.  I made a transverse incision just above the level of the  antecubitum and dissected through the subcutaneous tissue and fascia to gain exposure of the brachial artery.  This was noted to be 4-5 mm in diameter externally.  This was dissected out proximally and distally and controlled with vessel loops .  I then dissected out the basilic vein.  This was noted to be 2.5-3.0 mm in diameter externally.  The distal segment of the vein was ligated with a  2-0 silk, and the vein was transected.  The proximal segment was interrogated with serial dilators.  The vein accepted up to a 4.5 mm dilator without any difficulty.  I then instilled the heparinized saline into the vein and clamped it.  At this point, I reset my exposure of the brachial artery.  The patient was given 3000 units IV heparin.  I placed the artery under tension proximally and distally.  I made an arteriotomy with a #11 blade, and then I extended the arteriotomy with a Potts scissor.  I injected heparinized saline proximal and distal to this arteriotomy.  The vein was then sewn to the artery in an end-to-side configuration with a running stitch of 6-0 Prolene.  Prior to completing this anastomosis, I allowed the vein and artery to backbleed.  There was no evidence of clot from any vessels.  I completed the anastomosis in the usual fashion and then released all vessel loops and clamps.    There was a palpable thrill in the venous outflow, and there was a palpable radial pulse.  At this point, I irrigated out the surgical wound.  There was no further active bleeding.  The subcutaneous tissue was reapproximated with a running stitch of 3-0 Vicryl.  The skin was then reapproximated with a running subcuticular stitch of 4-0 Monocryl.  The skin was then cleaned, dried, and reinforced with Dermabond.  The patient tolerated this procedure well.   COMPLICATIONS: None  CONDITION: Stable  Marty Heck MD [facility] Office: [phone]  03/10/2019, 9:00 AM

## 2019-03-10 NOTE — Transfer of Care (Signed)
Immediate Anesthesia Transfer of Care Note  Patient: Vernon Blackburn  Procedure(s) Performed: LEFT ARM BASILIC ARTERIOVENOUS (AV) FISTULA CREATION FIRST STAGE (Left Arm Upper)  Patient Location: PACU  Anesthesia Type:MAC  Level of Consciousness: awake, drowsy and patient cooperative  Airway & Oxygen Therapy: Patient Spontanous Breathing  Post-op Assessment: Report given to RN, Post -op Vital signs reviewed and stable and Patient moving all extremities X 4  Post vital signs: Reviewed and stable  Last Vitals:  Vitals Value Taken Time  BP 139/86 03/10/19 0906  Temp 36.4 C 03/10/19 0905  Pulse 74 03/10/19 0907  Resp 19 03/10/19 0907  SpO2 100 % 03/10/19 0907  Vitals shown include unvalidated device data.  Last Pain:  Vitals:   03/10/19 0604  TempSrc:   PainSc: 0-No pain         Complications: No apparent anesthesia complications

## 2019-03-10 NOTE — Discharge Instructions (Signed)
° °  [facility] ° °Discharge Instructions ° °AV Fistula or Graft Surgery for Dialysis Access ° °Please refer to the following instructions for your post-procedure care. Your surgeon or physician assistant will discuss any changes with you. ° °Activity ° °You may drive the day following your surgery, if you are comfortable and no longer taking prescription pain medication. Resume full activity as the soreness in your incision resolves. ° °Bathing/Showering ° °You may shower after you go home. Keep your incision dry for 48 hours. Do not soak in a bathtub, hot tub, or swim until the incision heals completely. You may not shower if you have a hemodialysis catheter. ° °Incision Care ° °Clean your incision with mild soap and water after 48 hours. Pat the area dry with a clean towel. You do not need a bandage unless otherwise instructed. Do not apply any ointments or creams to your incision. You may have skin glue on your incision. Do not peel it off. It will come off on its own in about one week. Your arm may swell a bit after surgery. To reduce swelling use pillows to elevate your arm so it is above your heart. Your doctor will tell you if you need to lightly wrap your arm with an ACE bandage. ° °Diet ° °Resume your normal diet. There are not special food restrictions following this procedure. In order to heal from your surgery, it is CRITICAL to get adequate nutrition. Your body requires vitamins, minerals, and protein. Vegetables are the best source of vitamins and minerals. Vegetables also provide the perfect balance of protein. Processed food has little nutritional value, so try to avoid this. ° °Medications ° °Resume taking all of your medications. If your incision is causing pain, you may take over-the counter pain relievers such as acetaminophen (Tylenol). If you were prescribed a stronger pain medication, please be aware these medications can cause nausea and constipation. Prevent  nausea by taking the medication with a snack or meal. Avoid constipation by drinking plenty of fluids and eating foods with high amount of fiber, such as fruits, vegetables, and grains. Do not take Tylenol if you are taking prescription pain medications. ° ° ° ° °Follow up °Your surgeon may want to see you in the office following your access surgery. If so, this will be arranged at the time of your surgery. ° °Please call us immediately for any of the following conditions: ° °Increased pain, redness, drainage (pus) from your incision site °Fever of 101 degrees or higher °Severe or worsening pain at your incision site °Hand pain or numbness. ° °Reduce your risk of vascular disease: ° °Stop smoking. If you would like help, call [facility] at 1-800-QUIT-NOW (1-800-784-8669) or [facility] at 336-586-4000 ° °Manage your cholesterol °Maintain a desired weight °Control your diabetes °Keep your blood pressure down ° °Dialysis ° °It will take several weeks to several months for your new dialysis access to be ready for use. Your surgeon will determine when it is OK to use it. Your nephrologist will continue to direct your dialysis. You can continue to use your Permcath until your new access is ready for use. ° °If you have any questions, please call the office at [phone]. ° °

## 2019-04-10 ENCOUNTER — Ambulatory Visit (HOSPITAL_COMMUNITY)
Admission: RE | Admit: 2019-04-10 | Discharge: 2019-04-10 | Disposition: A | Discharge status: Home / Self Care | Payer: Medicaid Other | Source: Ambulatory Visit | Attending: Cardiology | Admitting: Cardiology

## 2019-04-10 ENCOUNTER — Encounter (HOSPITAL_COMMUNITY): Payer: Self-pay

## 2019-04-10 ENCOUNTER — Other Ambulatory Visit: Payer: Self-pay

## 2019-04-10 DIAGNOSIS — I5022 Chronic systolic (congestive) heart failure: Secondary | ICD-10-CM | POA: Diagnosis not present

## 2019-04-10 MED ORDER — HYDRALAZINE HCL 25 MG PO TABS: 25.0000 mg | ORAL_TABLET | Freq: Three times a day (TID) | ORAL | 3 refills | Status: DC

## 2019-04-10 MED ORDER — ISOSORBIDE MONONITRATE ER 30 MG PO TB24: 30.0000 mg | ORAL_TABLET | Freq: Every day | ORAL | 3 refills | Status: DC

## 2019-04-10 NOTE — Progress Notes (Signed)
Per Dr. Loralie Champagne:    Follow up in 3 months  Start Hydralazine 25mg  TID  Start Imdur 30mg  QD      Message sent to Scheduler to schedule 86month appt, Rx sent to patient pharmacy on file,avs sent to patient via mychart

## 2019-04-10 NOTE — Patient Instructions (Signed)
START Hydralazine 25mg (1 tablet) by mouth 3 times daily.  START Imdur 30mg (1 tablet) by mouth once daily.   No other medication changes were made. Please continue all other current medications as prescribed.   Your physician recommends that you schedule a follow-up appointment in: 3 months with [name]   Do the following things EVERYDAY: 1) Weigh yourself in the morning before breakfast. Write it down and keep it in a log. 2) Take your medicines as prescribed 3) Eat low salt foods--Limit salt (sodium) to 2000 mg per day.  4) Stay as active as you can everyday 5) Limit all fluids for the day to less than 2 liters   At the [facility], you and your health needs are our priority. As part of our continuing mission to provide you with exceptional heart care, we have created designated Provider Care Teams. These Care Teams include your primary Cardiologist (physician) and Advanced Practice Providers (APPs- Physician Assistants and Nurse Practitioners) who all work together to provide you with the care you need, when you need it.   You may see any of the following providers on your designated Care Team at your next follow up: Marland Kitchen Dr Glori Bickers . Dr Loralie Champagne . Darrick Grinder, NP . Lyda Jester, PA . Audry Riles, PharmD   Please be sure to bring in all your medications bottles to every appointment.

## 2019-04-10 NOTE — Progress Notes (Signed)
Heart Failure TeleHealth Note  Due to national recommendations of social distancing due to [place] 19, Audio/video telehealth visit is felt to be most appropriate for this patient at this time.  See MyChart message from today for patient consent regarding telehealth for [facility].  Date:  04/10/2019   ID:  Vernon Blackburn, DOB [birth year], MRN [id number]  Location: Home  Provider location: [facility] Type of Visit: Established patient   PCP:  [name], MD  Cardiologist:  [name]  Chief Complaint: Shortness of breath   History of Present Illness: Vernon Blackburn is a 54 y.o. male who presents via audio/video conferencing for a telehealth visit today.     he denies symptoms worrisome for COVID 19.   Patient has a history of long-standing, poorly-controlled HTN, CKD stage 3-4, chronic systolic CHF and severe mitral regurgitation presents for followup of CHF and HTN.  He has had hypertension for years.  He has had known CKD, followed by Dr. [name].  He developed severe dyspnea in 12/17 and was admitted with acute systolic CHF.  Echo showed EF 20-25% with severe MR.  He was diuresed and discharged.   TEE was done in 5/18 to assess mitral valve.  There was severe MR.  The MV was thickened and did not coapt well.  Suspect there was a component of secondary MR with moderate LV dilation, however the thickened leaflets suggested a component of primary MR as well.   He had right and left heart cath in 6/18.  No significant CAD, elevated filling pressures.    In 6/18, he had mitral valve repair.  Post-op echo in 7/18 showed EF 25-30% with stable MV repair (mild MR, mean gradient 4 mmHg).  Echo 12/18 with EF 30-35%, moderate LV dilation, s/p MVR repair with trivial MR.  Echo in 4/19 showed EF up to 40-45% with stable repaired mitral valve.   He had an echo in 8/20 with EF 35%, mild LV dilation, s/p MV repair with mean gradient 4 mmHg and trivial MR, mildly decreased RV  systolic function.   He was admitted in 2/21 to [facility] with worsening renal function and HD was started.  Echo at [facility] in 2/21 showed EF 40-45%, mild LV dilation, moderate LVH, "severe mitral stenosis" with mean gradient 7 mmHg and eccentric mild MR.  Head imagining showed an old CVA.   TEE was done to followup on ?severe mitral stenosis in 3/21.  This showed EF 45%, moderate LVH, global mild hypokinesis, mildly decreased RV systolic function; s/p MV repair, mild MR with mean gradient 4 mmHg, no significant mitral stenosis.   He is currently stable at home.  He is getting HD MWF.  He is tolerating HD well. No significant exertional dyspnea.  No chest pain.  No orthopnea/PND.      Labs (4/18): K 3.7, creatinine 3.05, hgb 10.4 Labs (5/18): K 3.3, creatinine 2.89, BNP 789 Labs (6/18): K 3.4, creatinine 2.4, hgb 10.1 Labs (7/18): K 3.2, creatinine 2.27, BNP 303 Labs (9/18): LDL 78, HDL 56, K 3.8, creatinine 2.16 Labs (10/18): hgb 10.1 Labs (2/19): K 3.3, creatinine 2.75, hgb 12.1 Labs (5/19): K 3.6, creatinine 3.07 Labs (7/20): K 4, creatinine 3.55, hgb 12.8 Labs (2/21): LDL 127  PMH: 1. Gout 2. HTN: Long-standing, poor control. 3. ESRD.  Likely hypertensive nephropathy.  4. Dilated cardiomyopathy: May be due to long-standing HTN.  - Echo (1/18): EF 20-25%, severe MR - Echo (2/18): EF 25%, severe MR.  -  TEE (5/18): Moderate LV dilation with severe global hypokinesis, EF 25-30%, normal RV size with mildly decreased systolic function, RV-RA gradient 55 mmHg, severe MR with mildly thickened leaflets with inadequate coaptation and ERO 0.52 cm^2 by PISA and systolic flow reversal in the pulmonary vein doppler pattern => secondary MR from dilated LV but thickened leaflets lead to concern for component of primary MR as well.  - RHC/LHC (6/18): No significant CAD.  Mean RA 15, PA 76/31 mean 48, mean PCWP 45, CI 3.02 Fick, 2.64 thermo.  - Echo (7/18): EF 25-30%, mild dilation, diffuse  hypokinesis, s/p MV repair with mean gradient 4 mmHg and mild MR, PASP 37 mmHg, normal RV size and systolic function.  - Echo (12/18): EF 30-35%, moderate LV dilation with diffuse hypokinesis, stable MV repair.  - Echo (4/19): EF 40-45%, severe LV dilation, s/p mitral valve repair with mean gradient 4 mmHg, no mitral regurgitation, severe RV dilation with normal systolic function.  - Echo (8/20): EF 35%, mild LV dilation, s/p MV repair with mean gradient 4 mmHg and trivial MR, mildly decreased RV systolic function.  - Echo (2/21, [facility]):  EF 40-45%, mild LV dilation, moderate LVH, "severe mitral stenosis" with mean gradient 7 mmHg and eccentric mild MR. - TEE (3/21): EF 45%, moderate LVH, global mild hypokinesis, mildly decreased RV systolic function; s/p MV repair, mild MR with mean gradient 4 mmHg, no significant mitral stenosis.  5. Severe MR: S/p MV repair in 6/18. 6. H/o CVA  SH: Married, lives in [place], nonsmoker, no drugs, occasional ETOH.  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.   Family History  Problem Relation Age of Onset  . Diabetes Mother   . Hypertension Mother   . Heart failure Mother   . Colon cancer Neg Hx   . Esophageal cancer Neg Hx   . Rectal cancer Neg Hx   . Stomach cancer Neg Hx    ROS: All systems reviewed and negative except as per HPI.   Current Outpatient Medications  Medication Sig Dispense Refill  . albuterol (PROVENTIL HFA;VENTOLIN HFA) 108 (90 Base) MCG/ACT inhaler Inhale 2 puffs into the lungs every 6 (six) hours as needed for wheezing or shortness of breath. 1 Inhaler 2  . aspirin EC 81 MG tablet Take 1 tablet (81 mg total) by mouth daily. 90 tablet 3  . B Complex-C-Zn-Folic Acid (DIALYVITE/ZINC) TABS Take 1 tablet by mouth at bedtime.    Marland Kitchen b complex-vitamin c-folic acid (NEPHRO-VITE) 0.8 MG TABS tablet Take 1 tablet by mouth daily.    . carvedilol (COREG) 6.25 MG tablet Take 6.25 mg by mouth 2 (two) times daily.     .  febuxostat (ULORIC) 40 MG tablet Take 40 mg by mouth daily.     . fluticasone (FLONASE) 50 MCG/ACT nasal spray Place 1 spray into both nostrils daily as needed for allergies or rhinitis.     . furosemide (LASIX) 40 MG tablet Take 40 mg by mouth See admin instructions. Take 40 mg by mouth twice daily on Monday, Wednesday and Friday, take 40 mg once daily on Tuesday, Thursday, Saturday and Sunday    . hydrALAZINE (APRESOLINE) 25 MG tablet Take 1 tablet (25 mg total) by mouth 3 (three) times daily. 270 tablet 3  . HYDROcodone-acetaminophen (NORCO) 5-325 MG tablet Take 1 tablet by mouth every 6 (six) hours as needed for moderate pain. 12 tablet 0  . isosorbide mononitrate (IMDUR) 30 MG 24 hr tablet Take 1 tablet (30 mg total)  by mouth daily. 90 tablet 3  . sevelamer carbonate (RENVELA) 800 MG tablet Take 800 mg by mouth 3 (three) times daily with meals.    . sildenafil (REVATIO) 20 MG tablet Take 20 mg by mouth as needed (ED).     . SM ANTACID 500 MG chewable tablet Chew 1,000 mg by mouth 2 (two) times daily.     . tamsulosin (FLOMAX) 0.4 MG CAPS capsule Take 0.4 mg by mouth.     No current facility-administered medications for this encounter.   Exam:  (Video/Tele Health Call; Exam is subjective and or/visual.) General:  Speaks in full sentences. No resp difficulty. Lungs: Normal respiratory effort with conversation.  Abdomen: Non-distended per patient report Extremities: Pt denies edema. Neuro: Alert & oriented x 3.   Assessment/Plan: 1. HTN: Possible hypertensive cardiomyopathy and hypertensive nephropathy.   2. ESRD: Tolerating HD so far.  Wants to eventually do PD. Also looking into renal transplant but has been turned down by [facility] due to cardiac issues.     3. Chronic systolic CHF: Echo in 2/09 with EF 25-30%. Nonischemic cardiomyopathy based on cath in 6/18.  Cause of cardiomyopathy may be long-standing, poorly controlled HTN.  NYHA class II symptoms.  He was initially improved  symptomatically s/p MV repair.  Echo in 4/19 with EF 40-45%. Echo at [facility] in 2/21 showed EF 40-45%.   TEE in 3/21 with EF 45%.  NYHA class II.  - Continue Coreg 6.25 mg bid. - Add hydralazine 25 mg tid + Imdur 30 mg daily.  4. Mitral regurgitation: s/p MV repair for severe MR.  Much improved symptomatically s/p surgery. Echo at [facility] in 2/21 suggested "severe" mitral stenosis with mean gradient 7 mmHg and eccentric MR, ?mild.  However, TEE here in 3/21 showed stable MV repair with mild MR and no significant mitral stenosis.  - Will need abx with dental work given prosthetic material.  5. Asthma: Occasional wheezing, not exertional.   6. CVA: Prior CVA by head imaging.    COVID screen The patient does not have any symptoms that suggest any further testing/ screening at this time.  Social distancing reinforced today.  Patient Risk: After full review of this patients clinical status, I feel that they are at moderate risk for cardiac decompensation at this time.  Relevant cardiac medications were reviewed at length with the patient today. The patient does not have concerns regarding their medications at this time.   Recommended follow-up:  3 months  Today, I have spent 17 minutes with the patient with telehealth technology discussing the above issues .    Signed, Loralie Champagne, MD  04/10/2019  [facility] [address] Heart and [place] [address] [phone] (office) [phone] (fax)

## 2019-04-15 ENCOUNTER — Other Ambulatory Visit: Payer: Self-pay | Admitting: *Deleted

## 2019-04-15 DIAGNOSIS — N184 Chronic kidney disease, stage 4 (severe): Secondary | ICD-10-CM

## 2019-04-21 ENCOUNTER — Telehealth (HOSPITAL_COMMUNITY): Payer: Self-pay

## 2019-04-21 NOTE — Telephone Encounter (Signed)

## 2019-04-22 ENCOUNTER — Other Ambulatory Visit: Payer: Self-pay

## 2019-04-22 ENCOUNTER — Ambulatory Visit (HOSPITAL_COMMUNITY)
Admission: RE | Admit: 2019-04-22 | Discharge: 2019-04-22 | Disposition: A | Discharge status: Home / Self Care | Payer: Medicaid Other | Source: Ambulatory Visit | Attending: Vascular Surgery | Admitting: Vascular Surgery

## 2019-04-22 ENCOUNTER — Encounter: Payer: Self-pay | Admitting: Vascular Surgery

## 2019-04-22 ENCOUNTER — Ambulatory Visit (INDEPENDENT_AMBULATORY_CARE_PROVIDER_SITE_OTHER): Payer: Self-pay | Admitting: Vascular Surgery

## 2019-04-22 DIAGNOSIS — N186 End stage renal disease: Secondary | ICD-10-CM | POA: Insufficient documentation

## 2019-04-22 DIAGNOSIS — Z992 Dependence on renal dialysis: Secondary | ICD-10-CM

## 2019-04-22 DIAGNOSIS — N184 Chronic kidney disease, stage 4 (severe): Secondary | ICD-10-CM

## 2019-04-22 NOTE — Progress Notes (Signed)
Patient name: Vernon Blackburn MRN: [id number] DOB: 1965-11-01 Sex: male  REASON FOR VISIT: Follow-up after left first stage basilic vein fistula   HPI: Vernon Blackburn is a 53 y.o. male with end-stage renal disease on hemodialysis Monday Wednesday Friday that presents for postop check after left first stage BVT.  His surgery was performed on 03/10/2019 and he had a marginal basilic vein in the left arm with no other usable surface veins in the left arm.  Patient is right-handed and would like to avoid right arm access.  He has a functioning right IJ tunnel catheter at this time.  States his left hand feels fine after surgery no numbness tingling etc.  Past Medical History:  Diagnosis Date  . Allergy   . Anemia, chronic disease   . Arthritis    Gout  . Asthma   . Cardiomyopathy [facility])    cardiologist is [employer]? [name], lov in office per patient was last year in 2019   . CHF (congestive heart failure) ([place])    a. 01/2016: echo showing EF of 20-25%, no WMA, severe MR, and PA Peak Pressure of 52 mm Hg.   Marland Kitchen Chronic systolic HF (heart failure) ([place])   . CKD (chronic kidney disease)    Dr [name] ,  stage 4 ; per patient lov  in office march 2020 , per patient at this last visit he was not approved as a candidate for kidney transplant because his kidney fx has improved to greater  than 20  . Elbow pain, left   . Heart murmur   . History of kidney stones   . Hypertension   . Knee pain, bilateral   . Left ankle pain 11/04/2010   Gout  . Leg pain, bilateral   . Peripheral edema    Bilateral lower extremity   . Pneumonia    hx  . Pulmonary HTN ([place])   . S/P minimally invasive mitral valve repair 06/29/2016   30 mm [name] via right mini thoracotomy approach  . Severe mitral regurgitation   . Sleep apnea    hx bariatric  surgery 3 yrs ago lost 166 lbs  . Syncope and collapse 07/14/2016  . Venous insufficiency     Past Surgical History:  Procedure Laterality  Date  . AV FISTULA PLACEMENT Left 03/10/2019   Procedure: LEFT ARM BASILIC ARTERIOVENOUS (AV) FISTULA CREATION FIRST STAGE;  Surgeon: Marty Heck, MD;  Location: [place];  Service: Vascular;  Laterality: Left;  . CYSTOSCOPY/URETEROSCOPY/HOLMIUM LASER/STENT PLACEMENT Right 07/17/2018   Procedure: CYSTOSCOPY/RETROGRADE/URETEROSCOPY/HOLMIUM LASER/STENT PLACEMENT/ CYSTOLITHALOPAXY;  Surgeon: Ceasar Mons, MD;  Location: [facility];  Service: Urology;  Laterality: Right;  . GASTRIC BYPASS  2014  . HERNIA REPAIR    . MENISCUS REPAIR Right 05/2008   right  knee  . MITRAL VALVE REPAIR Right 06/29/2016   Procedure: MINIMALLY INVASIVE MITRAL VALVE REPAIR  (MVR);  Surgeon: [name], MD;  Location: [place];  Service: Open Heart Surgery;  Laterality: Right;  . RIGHT/LEFT HEART CATH AND CORONARY ANGIOGRAPHY N/A 06/07/2016   Procedure: Right/Left Heart Cath and Coronary Angiography;  Surgeon: Larey Dresser, MD;  Location: [place] CV LAB;  Service: Cardiovascular;  Laterality: N/A;  . TEE WITHOUT CARDIOVERSION N/A 05/04/2016   Procedure: TRANSESOPHAGEAL ECHOCARDIOGRAM (TEE);  Surgeon: Larey Dresser, MD;  Location: [facility] ENDOSCOPY;  Service: Cardiovascular;  Laterality: N/A;  . TEE WITHOUT CARDIOVERSION N/A 06/29/2016   Procedure: TRANSESOPHAGEAL ECHOCARDIOGRAM (TEE);  Surgeon: [name], MD;  Location: [place];  Service: Open Heart Surgery;  Laterality: N/A;  . TEE WITHOUT CARDIOVERSION N/A 03/06/2019   Procedure: TRANSESOPHAGEAL ECHOCARDIOGRAM (TEE);  Surgeon: Larey Dresser, MD;  [facility];  Service: Cardiovascular;  Laterality: N/A;    Family History  Problem Relation Age of Onset  . Diabetes Mother   . Hypertension Mother   . Heart failure Mother   . Colon cancer Neg Hx   . Esophageal cancer Neg Hx   . Rectal cancer Neg Hx   . Stomach cancer Neg Hx     SOCIAL HISTORY: Social History   Tobacco Use  . Smoking status: Never Smoker  . Smokeless tobacco:  Never Used  Substance Use Topics  . Alcohol use: Not Currently    No Known Allergies  Current Outpatient Medications  Medication Sig Dispense Refill  . albuterol (PROVENTIL HFA;VENTOLIN HFA) 108 (90 Base) MCG/ACT inhaler Inhale 2 puffs into the lungs every 6 (six) hours as needed for wheezing or shortness of breath. 1 Inhaler 2  . aspirin EC 81 MG tablet Take 1 tablet (81 mg total) by mouth daily. 90 tablet 3  . B Complex-C-Zn-Folic Acid (DIALYVITE/ZINC) TABS Take 1 tablet by mouth at bedtime.    Marland Kitchen b complex-vitamin c-folic acid (NEPHRO-VITE) 0.8 MG TABS tablet Take 1 tablet by mouth daily.    . febuxostat (ULORIC) 40 MG tablet Take 40 mg by mouth daily.     . fluticasone (FLONASE) 50 MCG/ACT nasal spray Place 1 spray into both nostrils daily as needed for allergies or rhinitis.     . furosemide (LASIX) 40 MG tablet Take 40 mg by mouth See admin instructions. Take 40 mg by mouth twice daily on Monday, Wednesday and Friday, take 40 mg once daily on Tuesday, Thursday, Saturday and Sunday    . sevelamer carbonate (RENVELA) 800 MG tablet Take 800 mg by mouth 3 (three) times daily with meals.    . sildenafil (REVATIO) 20 MG tablet Take 20 mg by mouth as needed (ED).     . SM ANTACID 500 MG chewable tablet Chew 1,000 mg by mouth 2 (two) times daily.     . tamsulosin (FLOMAX) 0.4 MG CAPS capsule Take 0.4 mg by mouth.    . carvedilol (COREG) 6.25 MG tablet Take 6.25 mg by mouth 2 (two) times daily.     . hydrALAZINE (APRESOLINE) 25 MG tablet Take 1 tablet (25 mg total) by mouth 3 (three) times daily. (Patient not taking: Reported on 04/22/2019) 270 tablet 3  . HYDROcodone-acetaminophen (NORCO) 5-325 MG tablet Take 1 tablet by mouth every 6 (six) hours as needed for moderate pain. (Patient not taking: Reported on 04/22/2019) 12 tablet 0  . isosorbide mononitrate (IMDUR) 30 MG 24 hr tablet Take 1 tablet (30 mg total) by mouth daily. (Patient not taking: Reported on 04/22/2019) 90 tablet 3   No current  facility-administered medications for this visit.    REVIEW OF SYSTEMS:  [X]  denotes positive finding, [ ]  denotes negative finding Cardiac  Comments:  Chest pain or chest pressure:    Shortness of breath upon exertion:    Short of breath when lying flat:    Irregular heart rhythm:        Vascular    Pain in calf, thigh, or hip brought on by ambulation:    Pain in feet at night that wakes you up from your sleep:     Blood clot in your veins:  Leg swelling:         Pulmonary    Oxygen at home:    Productive cough:     Wheezing:         Neurologic    Sudden weakness in arms or legs:     Sudden numbness in arms or legs:     Sudden onset of difficulty speaking or slurred speech:    Temporary loss of vision in one eye:     Problems with dizziness:         Gastrointestinal    Blood in stool:     Vomited blood:         Genitourinary    Burning when urinating:     Blood in urine:        Psychiatric    Major depression:         Hematologic    Bleeding problems:    Problems with blood clotting too easily:        Skin    Rashes or ulcers:        Constitutional    Fever or chills:      PHYSICAL EXAM: Vitals:   04/22/19 0820  BP: 103/71  Pulse: 84  Resp: 16  Temp: (!) 97.3 F (36.3 C)  TempSrc: Temporal  SpO2: 100%  Weight: 189 lb (85.7 kg)  Height: 5\' 9"  (1.753 m)    GENERAL: The patient is a well-nourished male, in no acute distress. The vital signs are documented above. CARDIAC: There is a regular rate and rhythm.  VASCULAR:  Palpable left radial pulse at the wrist 1+ Thrill in the left basilic vein fistula Left arm incision well healed   DATA:   Reviewed his vein mapping and the basilic vein looks very small down by the artery anastomosis  Assessment/Plan:  54 year old male with end-stage renal disease that presents for postop check after left first stage basilic vein fistla on 09/10/8336.  He has a nice thrill on exam and he is having no steal  symptoms in the hand.  Unfortunately after review of fistula duplex today the fistula is small down by the artery anastomosis.  Discussed that would likely require conversion to a graft in the left arm but would reevaluate in operating room with ultrasound before ligating the fistula for failure to mature.  Discussed alternative would be moving to his right arm but he wants to avoid his right arm given he is right-handed.  Will arrange on a nondialysis day which will be Thursday in the near future.  Risks and benefits discussed.   Marty Heck, MD [facility] Office: [phone]

## 2019-04-29 ENCOUNTER — Encounter (HOSPITAL_COMMUNITY): Payer: Self-pay | Admitting: Vascular Surgery

## 2019-04-29 ENCOUNTER — Other Ambulatory Visit: Payer: Self-pay

## 2019-04-29 NOTE — Progress Notes (Signed)
Spoke with pt for pre-op call. Pt has had a Mitral Valve Repair done in 2018. [name] is his cardiologist. Denies any recent chest pain or sob. Pt states he is not diabetic.   Covid test scheduled for 04/30/19. Pt instructed to quarantine after test is done until he comes to the hospital on Thursday. Pt voiced understanding.

## 2019-04-30 ENCOUNTER — Other Ambulatory Visit (HOSPITAL_COMMUNITY)
Admission: RE | Admit: 2019-04-30 | Discharge: 2019-04-30 | Disposition: A | Discharge status: Home / Self Care | Payer: Medicaid Other | Source: Ambulatory Visit | Attending: Vascular Surgery | Admitting: Vascular Surgery

## 2019-04-30 DIAGNOSIS — Z20822 Contact with and (suspected) exposure to covid-19: Secondary | ICD-10-CM | POA: Diagnosis not present

## 2019-04-30 DIAGNOSIS — Z01812 Encounter for preprocedural laboratory examination: Secondary | ICD-10-CM | POA: Insufficient documentation

## 2019-04-30 LAB — SARS CORONAVIRUS 2 (TAT 6-24 HRS): SARS Coronavirus 2: NEGATIVE

## 2019-05-01 ENCOUNTER — Encounter (HOSPITAL_COMMUNITY)
Admission: RE | Disposition: A | Discharge status: Home / Self Care | Payer: Self-pay | Source: Home / Self Care | Attending: Vascular Surgery

## 2019-05-01 ENCOUNTER — Ambulatory Visit (HOSPITAL_COMMUNITY)
Admission: RE | Admit: 2019-05-01 | Discharge: 2019-05-01 | Disposition: A | Discharge status: Home / Self Care | Payer: Medicaid Other | Attending: Vascular Surgery | Admitting: Vascular Surgery

## 2019-05-01 ENCOUNTER — Other Ambulatory Visit: Payer: Self-pay

## 2019-05-01 ENCOUNTER — Ambulatory Visit (HOSPITAL_COMMUNITY): Payer: Medicaid Other | Admitting: Certified Registered Nurse Anesthetist

## 2019-05-01 ENCOUNTER — Encounter (HOSPITAL_COMMUNITY): Payer: Self-pay | Admitting: Vascular Surgery

## 2019-05-01 DIAGNOSIS — Z95828 Presence of other vascular implants and grafts: Secondary | ICD-10-CM | POA: Diagnosis not present

## 2019-05-01 DIAGNOSIS — Z7982 Long term (current) use of aspirin: Secondary | ICD-10-CM | POA: Diagnosis not present

## 2019-05-01 DIAGNOSIS — G4730 Sleep apnea, unspecified: Secondary | ICD-10-CM | POA: Diagnosis not present

## 2019-05-01 DIAGNOSIS — J45909 Unspecified asthma, uncomplicated: Secondary | ICD-10-CM | POA: Diagnosis not present

## 2019-05-01 DIAGNOSIS — I872 Venous insufficiency (chronic) (peripheral): Secondary | ICD-10-CM | POA: Insufficient documentation

## 2019-05-01 DIAGNOSIS — I132 Hypertensive heart and chronic kidney disease with heart failure and with stage 5 chronic kidney disease, or end stage renal disease: Secondary | ICD-10-CM | POA: Diagnosis not present

## 2019-05-01 DIAGNOSIS — N186 End stage renal disease: Secondary | ICD-10-CM | POA: Diagnosis not present

## 2019-05-01 DIAGNOSIS — Z79899 Other long term (current) drug therapy: Secondary | ICD-10-CM | POA: Insufficient documentation

## 2019-05-01 DIAGNOSIS — Z960 Presence of urogenital implants: Secondary | ICD-10-CM | POA: Diagnosis not present

## 2019-05-01 DIAGNOSIS — Z8249 Family history of ischemic heart disease and other diseases of the circulatory system: Secondary | ICD-10-CM | POA: Insufficient documentation

## 2019-05-01 DIAGNOSIS — I5022 Chronic systolic (congestive) heart failure: Secondary | ICD-10-CM | POA: Insufficient documentation

## 2019-05-01 DIAGNOSIS — I272 Pulmonary hypertension, unspecified: Secondary | ICD-10-CM | POA: Insufficient documentation

## 2019-05-01 DIAGNOSIS — I429 Cardiomyopathy, unspecified: Secondary | ICD-10-CM | POA: Insufficient documentation

## 2019-05-01 DIAGNOSIS — Z9884 Bariatric surgery status: Secondary | ICD-10-CM | POA: Diagnosis not present

## 2019-05-01 DIAGNOSIS — Z992 Dependence on renal dialysis: Secondary | ICD-10-CM | POA: Diagnosis not present

## 2019-05-01 DIAGNOSIS — M1990 Unspecified osteoarthritis, unspecified site: Secondary | ICD-10-CM | POA: Diagnosis not present

## 2019-05-01 DIAGNOSIS — M109 Gout, unspecified: Secondary | ICD-10-CM | POA: Diagnosis not present

## 2019-05-01 DIAGNOSIS — N185 Chronic kidney disease, stage 5: Secondary | ICD-10-CM

## 2019-05-01 DIAGNOSIS — Z833 Family history of diabetes mellitus: Secondary | ICD-10-CM | POA: Insufficient documentation

## 2019-05-01 HISTORY — DX: Attention-deficit hyperactivity disorder, unspecified type: F90.9

## 2019-05-01 HISTORY — PX: BASCILIC VEIN TRANSPOSITION: SHX5742

## 2019-05-01 LAB — POCT I-STAT, CHEM 8
BUN: 56 mg/dL — ABNORMAL HIGH (ref 6–20)
Calcium, Ion: 0.82 mmol/L — CL (ref 1.15–1.40)
Chloride: 99 mmol/L (ref 98–111)
Creatinine, Ser: 10.1 mg/dL — ABNORMAL HIGH (ref 0.61–1.24)
Glucose, Bld: 83 mg/dL (ref 70–99)
HCT: 44 % (ref 39.0–52.0)
Hemoglobin: 15 g/dL (ref 13.0–17.0)
Potassium: 4.3 mmol/L (ref 3.5–5.1)
Sodium: 138 mmol/L (ref 135–145)
TCO2: 28 mmol/L (ref 22–32)

## 2019-05-01 SURGERY — TRANSPOSITION, VEIN, BASILIC
Anesthesia: Monitor Anesthesia Care | Site: Arm Lower | Laterality: Left

## 2019-05-01 MED ORDER — ONDANSETRON HCL 4 MG/2ML IJ SOLN
INTRAMUSCULAR | Status: DC | PRN
  Administered 2019-05-01: 4 mg via INTRAVENOUS

## 2019-05-01 MED ORDER — MIDAZOLAM HCL 2 MG/2ML IJ SOLN
INTRAMUSCULAR | Status: AC
  Filled 2019-05-01: qty 2

## 2019-05-01 MED ORDER — PHENYLEPHRINE HCL (PRESSORS) 10 MG/ML IV SOLN
INTRAVENOUS | Status: DC | PRN
  Administered 2019-05-01: 80 ug via INTRAVENOUS
  Administered 2019-05-01: 120 ug via INTRAVENOUS
  Administered 2019-05-01: 80 ug via INTRAVENOUS
  Administered 2019-05-01: 120 ug via INTRAVENOUS

## 2019-05-01 MED ORDER — CHLORHEXIDINE GLUCONATE 4 % EX LIQD: 60.0000 mL | Freq: Once | CUTANEOUS | Status: DC

## 2019-05-01 MED ORDER — ONDANSETRON HCL 4 MG/2ML IJ SOLN
INTRAMUSCULAR | Status: AC
  Filled 2019-05-01: qty 2

## 2019-05-01 MED ORDER — SODIUM CHLORIDE 0.9 % IV SOLN: INTRAVENOUS | Status: DC

## 2019-05-01 MED ORDER — MIDAZOLAM HCL 2 MG/2ML IJ SOLN: 0.5000 mg | Freq: Once | INTRAMUSCULAR | Status: DC | PRN

## 2019-05-01 MED ORDER — ROCURONIUM BROMIDE 10 MG/ML (PF) SYRINGE
PREFILLED_SYRINGE | INTRAVENOUS | Status: AC
  Filled 2019-05-01: qty 10

## 2019-05-01 MED ORDER — PROPOFOL 500 MG/50ML IV EMUL
INTRAVENOUS | Status: AC
  Filled 2019-05-01: qty 50

## 2019-05-01 MED ORDER — HYDROCODONE-ACETAMINOPHEN 5-325 MG PO TABS
1.0000 | ORAL_TABLET | Freq: Once | ORAL | Status: AC
  Administered 2019-05-01: 1 via ORAL

## 2019-05-01 MED ORDER — HEPARIN SODIUM (PORCINE) 1000 UNIT/ML IJ SOLN
INTRAMUSCULAR | Status: AC
  Filled 2019-05-01: qty 3

## 2019-05-01 MED ORDER — 0.9 % SODIUM CHLORIDE (POUR BTL) OPTIME
TOPICAL | Status: DC | PRN
  Administered 2019-05-01: 1000 mL

## 2019-05-01 MED ORDER — PROPOFOL 10 MG/ML IV BOLUS
INTRAVENOUS | Status: DC | PRN
  Administered 2019-05-01: 20 mg via INTRAVENOUS

## 2019-05-01 MED ORDER — PHENYLEPHRINE 40 MCG/ML (10ML) SYRINGE FOR IV PUSH (FOR BLOOD PRESSURE SUPPORT)
PREFILLED_SYRINGE | INTRAVENOUS | Status: AC
  Filled 2019-05-01: qty 10

## 2019-05-01 MED ORDER — FENTANYL CITRATE (PF) 100 MCG/2ML IJ SOLN
INTRAMUSCULAR | Status: DC | PRN
  Administered 2019-05-01 (×2): 25 ug via INTRAVENOUS

## 2019-05-01 MED ORDER — LIDOCAINE HCL (PF) 1 % IJ SOLN
INTRAMUSCULAR | Status: AC
  Filled 2019-05-01: qty 30

## 2019-05-01 MED ORDER — SODIUM CHLORIDE 0.9 % IV SOLN
INTRAVENOUS | Status: DC | PRN
  Administered 2019-05-01: 500 mL

## 2019-05-01 MED ORDER — HYDROCODONE-ACETAMINOPHEN 5-325 MG PO TABS
ORAL_TABLET | ORAL | Status: AC
  Filled 2019-05-01: qty 1

## 2019-05-01 MED ORDER — PROPOFOL 10 MG/ML IV BOLUS
INTRAVENOUS | Status: AC
  Filled 2019-05-01: qty 20

## 2019-05-01 MED ORDER — HYDROMORPHONE HCL 1 MG/ML IJ SOLN
INTRAMUSCULAR | Status: AC
  Filled 2019-05-01: qty 1

## 2019-05-01 MED ORDER — CEFAZOLIN SODIUM-DEXTROSE 2-4 GM/100ML-% IV SOLN
2.0000 g | INTRAVENOUS | Status: AC
  Administered 2019-05-01: 2 g via INTRAVENOUS
  Filled 2019-05-01: qty 100

## 2019-05-01 MED ORDER — FENTANYL CITRATE (PF) 250 MCG/5ML IJ SOLN
INTRAMUSCULAR | Status: AC
  Filled 2019-05-01: qty 5

## 2019-05-01 MED ORDER — HYDROCODONE-ACETAMINOPHEN 5-325 MG PO TABS: 1.0000 | ORAL_TABLET | Freq: Four times a day (QID) | ORAL | 0 refills | Status: DC | PRN

## 2019-05-01 MED ORDER — HEPARIN SODIUM (PORCINE) 1000 UNIT/ML IJ SOLN
INTRAMUSCULAR | Status: DC | PRN
  Administered 2019-05-01: 3000 [IU] via INTRAVENOUS

## 2019-05-01 MED ORDER — SODIUM CHLORIDE 0.9 % IV SOLN
INTRAVENOUS | Status: AC
  Filled 2019-05-01: qty 1.2

## 2019-05-01 MED ORDER — PROMETHAZINE HCL 25 MG/ML IJ SOLN: 6.2500 mg | INTRAMUSCULAR | Status: DC | PRN

## 2019-05-01 MED ORDER — EPINEPHRINE 1 MG/10ML IJ SOSY
PREFILLED_SYRINGE | INTRAMUSCULAR | Status: AC
  Filled 2019-05-01: qty 10

## 2019-05-01 MED ORDER — PROTAMINE SULFATE 10 MG/ML IV SOLN
INTRAVENOUS | Status: AC
  Filled 2019-05-01: qty 5

## 2019-05-01 MED ORDER — LIDOCAINE 2% (20 MG/ML) 5 ML SYRINGE
INTRAMUSCULAR | Status: AC
  Filled 2019-05-01: qty 5

## 2019-05-01 MED ORDER — LIDOCAINE HCL 1 % IJ SOLN
INTRAMUSCULAR | Status: DC | PRN
  Administered 2019-05-01: 2 mL

## 2019-05-01 MED ORDER — MEPERIDINE HCL 25 MG/ML IJ SOLN: 6.2500 mg | INTRAMUSCULAR | Status: DC | PRN

## 2019-05-01 MED ORDER — MIDAZOLAM HCL 5 MG/5ML IJ SOLN
INTRAMUSCULAR | Status: DC | PRN
  Administered 2019-05-01: 1 mg via INTRAVENOUS

## 2019-05-01 MED ORDER — HYDROMORPHONE HCL 1 MG/ML IJ SOLN
0.2500 mg | INTRAMUSCULAR | Status: DC | PRN
  Administered 2019-05-01 (×2): 0.5 mg via INTRAVENOUS

## 2019-05-01 MED ORDER — PROPOFOL 500 MG/50ML IV EMUL
INTRAVENOUS | Status: DC | PRN
  Administered 2019-05-01: 100 ug/kg/min via INTRAVENOUS

## 2019-05-01 SURGICAL SUPPLY — 44 items
ARMBAND PINK RESTRICT EXTREMIT (MISCELLANEOUS) ×2 IMPLANT
CANISTER SUCT 3000ML PPV (MISCELLANEOUS) ×2 IMPLANT
CLIP VESOCCLUDE MED 24/CT (CLIP) ×2 IMPLANT
CLIP VESOCCLUDE SM WIDE 24/CT (CLIP) ×2 IMPLANT
COVER PROBE W GEL 5X96 (DRAPES) ×2 IMPLANT
COVER WAND RF STERILE (DRAPES) ×2 IMPLANT
DECANTER SPIKE VIAL GLASS SM (MISCELLANEOUS) ×2 IMPLANT
DERMABOND ADVANCED (GAUZE/BANDAGES/DRESSINGS) ×1
DERMABOND ADVANCED .7 DNX12 (GAUZE/BANDAGES/DRESSINGS) ×1 IMPLANT
ELECT REM PT RETURN 9FT ADLT (ELECTROSURGICAL) ×2
ELECTRODE REM PT RTRN 9FT ADLT (ELECTROSURGICAL) ×1 IMPLANT
GLOVE BIO SURGEON STRL SZ 6.5 (GLOVE) ×2 IMPLANT
GLOVE BIO SURGEON STRL SZ7.5 (GLOVE) ×4 IMPLANT
GLOVE BIOGEL M 6.5 STRL (GLOVE) ×2 IMPLANT
GLOVE BIOGEL PI IND STRL 6.5 (GLOVE) ×1 IMPLANT
GLOVE BIOGEL PI IND STRL 7.0 (GLOVE) ×1 IMPLANT
GLOVE BIOGEL PI IND STRL 7.5 (GLOVE) ×1 IMPLANT
GLOVE BIOGEL PI IND STRL 8 (GLOVE) ×2 IMPLANT
GLOVE BIOGEL PI INDICATOR 6.5 (GLOVE) ×1
GLOVE BIOGEL PI INDICATOR 7.0 (GLOVE) ×1
GLOVE BIOGEL PI INDICATOR 7.5 (GLOVE) ×1
GLOVE BIOGEL PI INDICATOR 8 (GLOVE) ×2
GOWN STRL REUS W/ TWL LRG LVL3 (GOWN DISPOSABLE) ×2 IMPLANT
GOWN STRL REUS W/ TWL XL LVL3 (GOWN DISPOSABLE) ×2 IMPLANT
GOWN STRL REUS W/TWL LRG LVL3 (GOWN DISPOSABLE) ×4
GOWN STRL REUS W/TWL XL LVL3 (GOWN DISPOSABLE) ×4
HEMOSTAT SPONGE AVITENE ULTRA (HEMOSTASIS) IMPLANT
KIT BASIN OR (CUSTOM PROCEDURE TRAY) ×2 IMPLANT
KIT TURNOVER KIT B (KITS) ×2 IMPLANT
NEEDLE HYPO 25GX1X1/2 BEV (NEEDLE) ×2 IMPLANT
NS IRRIG 1000ML POUR BTL (IV SOLUTION) ×2 IMPLANT
PACK CV ACCESS (CUSTOM PROCEDURE TRAY) ×2 IMPLANT
PAD ARMBOARD 7.5X6 YLW CONV (MISCELLANEOUS) ×4 IMPLANT
SUT MNCRL AB 4-0 PS2 18 (SUTURE) ×2 IMPLANT
SUT PROLENE 6 0 BV (SUTURE) ×8 IMPLANT
SUT PROLENE 7 0 BV 1 (SUTURE) IMPLANT
SUT SILK 2 0 SH (SUTURE) ×2 IMPLANT
SUT VIC AB 2-0 CT1 27 (SUTURE) ×2
SUT VIC AB 2-0 CT1 TAPERPNT 27 (SUTURE) ×1 IMPLANT
SUT VIC AB 3-0 SH 27 (SUTURE) ×6
SUT VIC AB 3-0 SH 27X BRD (SUTURE) ×3 IMPLANT
TOWEL GREEN STERILE (TOWEL DISPOSABLE) ×2 IMPLANT
UNDERPAD 30X30 (UNDERPADS AND DIAPERS) ×2 IMPLANT
WATER STERILE IRR 1000ML POUR (IV SOLUTION) ×2 IMPLANT

## 2019-05-01 NOTE — Anesthesia Preprocedure Evaluation (Addendum)
Anesthesia Evaluation  Patient identified by MRN, date of birth, ID band Patient awake    Reviewed: Allergy & Precautions, NPO status , Patient's Chart, lab work & pertinent test results, reviewed documented beta blocker date and time   History of Anesthesia Complications Negative for: history of anesthetic complications  Airway Mallampati: II  TM Distance: >3 FB Neck ROM: Full    Dental  (+) Dental Advisory Given   Pulmonary sleep apnea (does not require CPAP) , COPD,  COPD inhaler,  04/30/2019 SARS coronavirus NEG   breath sounds clear to auscultation       Cardiovascular hypertension, Pt. on medications and Pt. on home beta blockers (-) angina+CHF  + Valvular Problems/Murmurs (s/p MV repair '18)  Rhythm:Regular Rate:Normal  3/21 ECHO: EF 45%. The LV has mildly decreased function, global hypokinesis, moderate left ventricular hypertrophy. RVsystolic function is mildly reduced, mild MR   Neuro/Psych    GI/Hepatic negative GI ROS, Neg liver ROS, S/p gastric bypass    Endo/Other  negative endocrine ROS  Renal/GU Dialysis and ESRFRenal disease (K+ 4.3)     Musculoskeletal   Abdominal   Peds  Hematology negative hematology ROS (+)   Anesthesia Other Findings   Reproductive/Obstetrics                            Anesthesia Physical Anesthesia Plan  ASA: III  Anesthesia Plan: MAC   Post-op Pain Management:    Induction:   PONV Risk Score and Plan: 1 and Ondansetron  Airway Management Planned: Natural Airway and Simple Face Mask  Additional Equipment:   Intra-op Plan:   Post-operative Plan:   Informed Consent: I have reviewed the patients History and Physical, chart, labs and discussed the procedure including the risks, benefits and alternatives for the proposed anesthesia with the patient or authorized representative who has indicated his/her understanding and acceptance.      Dental advisory given  Plan Discussed with: CRNA and Surgeon  Anesthesia Plan Comments:        Anesthesia Quick Evaluation

## 2019-05-01 NOTE — Anesthesia Postprocedure Evaluation (Signed)
Anesthesia Post Note  Patient: Vernon Blackburn  Procedure(s) Performed: LEFT SECOND STAGE BASCILIC VEIN TRANSPOSITION. (Left Arm Lower)     Patient location during evaluation: PACU Anesthesia Type: MAC Level of consciousness: awake and alert, oriented and patient cooperative Pain management: pain level controlled Vital Signs Assessment: post-procedure vital signs reviewed and stable Respiratory status: spontaneous breathing, nonlabored ventilation and respiratory function stable Cardiovascular status: blood pressure returned to baseline and stable Postop Assessment: no apparent nausea or vomiting Anesthetic complications: no    Last Vitals:  Vitals:   05/01/19 1441 05/01/19 1458  BP: (!) 140/95 133/70  Pulse: 71 77  Resp: 16 16  Temp:  36.8 C  SpO2: 98% 99%    Last Pain:  Vitals:   05/01/19 1458  TempSrc:   PainSc: 0-No pain                 Lameeka Schleifer,E. Tennis Mckinnon

## 2019-05-01 NOTE — Op Note (Signed)
    OPERATIVE NOTE   PROCEDURE: left second stage basilic vein transposition (brachiobasilic arteriovenous fistula) placement  PRE-OPERATIVE DIAGNOSIS: ESRD  POST-OPERATIVE DIAGNOSIS: same  SURGEON: Marty Heck, MD  ASSISTANT(S): Arlee Muslim, PA  ANESTHESIA: MAC  ESTIMATED BLOOD LOSS: Minimal  FINDING(S): There was some concern that the brachiobasilic fistula was not maturing after first stage given the vein remained small down by the artery anastomosis.  When I evaluated this with ultrasound it looked larger than the fistula duplex would suggest.  I was able to pass a #5 dilator proximally across the anastomosis into the artery without any resistance and had excellent inflow.  As a result I went ahead and did a second stage basilic vein fistula today and he has a good thrill at completion.  Palpable radial pulse at the wrist.  SPECIMEN(S):  None  INDICATIONS:   Vernon Blackburn is a 54 y.o. male who presents with ESRD and need for second stage basilic vein fistula.  The patient is scheduled for left second stage basilic vein transposition.  The patient is aware the risks include but are not limited to: bleeding, infection, steal syndrome, nerve damage, ischemic monomelic neuropathy, failure to mature, and need for additional procedures.  The patient is aware of the risks of the procedure and elects to proceed forward.   DESCRIPTION: After full informed written consent was obtained from the patient, the patient was brought back to the operating room and placed supine upon the operating table.  Prior to induction, the patient received IV antibiotics.   After obtaining adequate anesthesia, the patient was then prepped and draped in the standard fashion for a left arm access procedure.  I turned my attention first to identifying the patient's brachiobasilic arteriovenous fistula.  Using SonoSite guidance, the location of this fistula was marked out on the skin.    This was a good  caliber vein.  I made three longitudinal incisions on the medial aspect of the left upper arm.  Through these incisions I dissected out circumferentially the basilic vein, taking care to protect the nerve.  Once the vein was fully mobilized, all side branches were ligated between silk ties.  The vein was marked for orientation.  I then used a tunneler to create a subcutaneous tunnel.  The vein was then transected near the antecubital crease.  It was then brought to the previously created tunnel making sure to maintain proper orientation.  I passed a #5 dilator both proximal and distal across the artery anastomosis without any resistance.  A primary anastomosis was then performed between the two cut ends of the vein after it was spatulated with a running 6-0 Prolene.  Once this was done the clamps were released.  There was excellent flow through the fistula.  Hemostasis was then achieved.  The wound was irrigated.  The incision was closed with a deep layer of 3-0 Vicryl followed by a subcutaneous 4-0 Monocryl and Dermabond.  There were no immediate complications.  COMPLICATIONS: None  CONDITION: Stable  Marty Heck, MD [facility]: [phone]  05/01/2019, 2:03 PM

## 2019-05-01 NOTE — H&P (Signed)
History and Physical Interval Note:  05/01/2019 12:04 PM  Vernon Blackburn  has presented today for surgery, with the diagnosis of END STAGE RENAL DISEASE.  The various methods of treatment have been discussed with the patient and family. After consideration of risks, benefits and other options for treatment, the patient has consented to  Procedure(s): LEFT SECOND STAGE [place] (Left) as a surgical intervention.  The patient's history has been reviewed, patient examined, no change in status, stable for surgery.  I have reviewed the patient's chart and labs.  Questions were answered to the patient's satisfaction.    Left 2nd stage BVT vs graft  Marty Heck  Patient name: Vernon Blackburn MRN: [id number] DOB: [birth year] Sex: male  REASON FOR VISIT: Follow-up after left first stage basilic vein fistula  HPI:  Vernon Blackburn is a 54 y.o. male with end-stage renal disease on hemodialysis Monday Wednesday Friday that presents for postop check after left first stage BVT. His surgery was performed on 03/10/2019 and he had a marginal basilic vein in the left arm with no other usable surface veins in the left arm. Patient is right-handed and would like to avoid right arm access. He has a functioning right IJ tunnel catheter at this time. States his left hand feels fine after surgery no numbness tingling etc.      Past Medical History:  Diagnosis Date  . Allergy   . Anemia, chronic disease   . Arthritis    Gout  . Asthma   . Cardiomyopathy [facility])    cardiologist is [employer]? [name], lov in office per patient was last year in 2019   . CHF (congestive heart failure) ([place])    a. 01/2016: echo showing EF of 20-25%, no WMA, severe MR, and PA Peak Pressure of 52 mm Hg.   Marland Kitchen Chronic systolic HF (heart failure) ([place])   . CKD (chronic kidney disease)    Dr [name] , stage 4 ; per patient lov in office march 2020 , per patient at this last visit he  was not approved as a candidate for kidney transplant because his kidney fx has improved to greater than 20  . Elbow pain, left   . Heart murmur   . History of kidney stones   . Hypertension   . Knee pain, bilateral   . Left ankle pain 11/04/2010   Gout  . Leg pain, bilateral   . Peripheral edema    Bilateral lower extremity   . Pneumonia    hx  . Pulmonary HTN ([place])   . S/P minimally invasive mitral valve repair 06/29/2016   30 mm [name] via right mini thoracotomy approach  . Severe mitral regurgitation   . Sleep apnea    hx bariatric surgery 3 yrs ago lost 166 lbs  . Syncope and collapse 07/14/2016  . Venous insufficiency         Past Surgical History:  Procedure Laterality Date  . AV FISTULA PLACEMENT Left 03/10/2019   Procedure: LEFT ARM BASILIC ARTERIOVENOUS (AV) FISTULA CREATION FIRST STAGE; Surgeon: Marty Heck, MD; Location: [place]; Service: Vascular; Laterality: Left;  . CYSTOSCOPY/URETEROSCOPY/HOLMIUM LASER/STENT PLACEMENT Right 07/17/2018   Procedure: CYSTOSCOPY/RETROGRADE/URETEROSCOPY/HOLMIUM LASER/STENT PLACEMENT/ CYSTOLITHALOPAXY; Surgeon: Ceasar Mons, MD; Location: [facility]; Service: Urology; Laterality: Right;  . GASTRIC BYPASS  2014  . HERNIA REPAIR    . MENISCUS REPAIR Right 05/2008   right knee  . MITRAL VALVE REPAIR  Right 06/29/2016   Procedure: MINIMALLY INVASIVE MITRAL VALVE REPAIR (MVR); Surgeon: [name], MD; Location: [place]; Service: Open Heart Surgery; Laterality: Right;  . RIGHT/LEFT HEART CATH AND CORONARY ANGIOGRAPHY N/A 06/07/2016   Procedure: Right/Left Heart Cath and Coronary Angiography; Surgeon: Larey Dresser, MD; Location: [place] [facility]; Service: Cardiovascular; Laterality: N/A;  . TEE WITHOUT CARDIOVERSION N/A 05/04/2016   Procedure: TRANSESOPHAGEAL ECHOCARDIOGRAM (TEE); Surgeon: Larey Dresser, MD; Location: [facility] ENDOSCOPY; Service: Cardiovascular; Laterality: N/A;  . TEE WITHOUT  CARDIOVERSION N/A 06/29/2016   Procedure: TRANSESOPHAGEAL ECHOCARDIOGRAM (TEE); Surgeon: [name], MD; Location: [place]; Service: Open Heart Surgery; Laterality: N/A;  . TEE WITHOUT CARDIOVERSION N/A 03/06/2019   Procedure: TRANSESOPHAGEAL ECHOCARDIOGRAM (TEE); Surgeon: Larey Dresser, MD; Location[facility] END[facility]COPY; Service: Cardiovascular; Laterality: N/A;        Family History  Problem Relation Age of Onset  . Diabetes Mother   . Hypertension Mother   . Heart failure Mother   . Colon cancer Neg Hx   . Esophageal cancer Neg Hx   . Rectal cancer Neg Hx   . Stomach cancer Neg Hx    SOCIAL HISTORY:  Social History       Tobacco Use  . Smoking status: Never Smoker  . Smokeless tobacco: Never Used  Substance Use Topics  . Alcohol use: Not Currently   No Known Allergies        Current Outpatient Medications  Medication Sig Dispense Refill  . albuterol (PROVENTIL HFA;VENTOLIN HFA) 108 (90 Base) MCG/ACT inhaler Inhale 2 puffs into the lungs every 6 (six) hours as needed for wheezing or shortness of breath. 1 Inhaler 2  . aspirin EC 81 MG tablet Take 1 tablet (81 mg total) by mouth daily. 90 tablet 3  . B Complex-C-Zn-Folic Acid (DIALYVITE/ZINC) TABS Take 1 tablet by mouth at bedtime.    Marland Kitchen b complex-vitamin c-folic acid (NEPHRO-VITE) 0.8 MG TABS tablet Take 1 tablet by mouth daily.    . febuxostat (ULORIC) 40 MG tablet Take 40 mg by mouth daily.     . fluticasone (FLONASE) 50 MCG/ACT nasal spray Place 1 spray into both nostrils daily as needed for allergies or rhinitis.     . furosemide (LASIX) 40 MG tablet Take 40 mg by mouth See admin instructions. Take 40 mg by mouth twice daily on Monday, Wednesday and Friday, take 40 mg once daily on Tuesday, Thursday, Saturday and Sunday    . sevelamer carbonate (RENVELA) 800 MG tablet Take 800 mg by mouth 3 (three) times daily with meals.    . sildenafil (REVATIO) 20 MG tablet Take 20 mg by mouth as needed (ED).     . SM ANTACID 500 MG  chewable tablet Chew 1,000 mg by mouth 2 (two) times daily.     . tamsulosin (FLOMAX) 0.4 MG CAPS capsule Take 0.4 mg by mouth.    . carvedilol (COREG) 6.25 MG tablet Take 6.25 mg by mouth 2 (two) times daily.     . hydrALAZINE (APRESOLINE) 25 MG tablet Take 1 tablet (25 mg total) by mouth 3 (three) times daily. (Patient not taking: Reported on 04/22/2019) 270 tablet 3  . HYDROcodone-acetaminophen (NORCO) 5-325 MG tablet Take 1 tablet by mouth every 6 (six) hours as needed for moderate pain. (Patient not taking: Reported on 04/22/2019) 12 tablet 0  . isosorbide mononitrate (IMDUR) 30 MG 24 hr tablet Take 1 tablet (30 mg total) by mouth daily. (Patient not taking: Reported on 04/22/2019) 90 tablet 3  No current facility-administered medications for this visit.   REVIEW OF SYSTEMS:  [X]  denotes positive finding, [ ]  denotes negative finding  Cardiac  Comments:  Chest pain or chest pressure:    Shortness of breath upon exertion:    Short of breath when lying flat:    Irregular heart rhythm:        Vascular    Pain in calf, thigh, or hip brought on by ambulation:    Pain in feet at night that wakes you up from your sleep:     Blood clot in your veins:    Leg swelling:         Pulmonary    Oxygen at home:    Productive cough:     Wheezing:         Neurologic    Sudden weakness in arms or legs:     Sudden numbness in arms or legs:     Sudden onset of difficulty speaking or slurred speech:    Temporary loss of vision in one eye:     Problems with dizziness:         Gastrointestinal    Blood in stool:     Vomited blood:         Genitourinary    Burning when urinating:     Blood in urine:        Psychiatric    Major depression:         Hematologic    Bleeding problems:    Problems with blood clotting too easily:        Skin    Rashes or ulcers:        Constitutional    Fever or chills:    PHYSICAL EXAM:     Vitals:   04/22/19 0820  BP: 103/71  Pulse: 84  Resp: 16   Temp: (!) 97.3 F (36.3 C)  TempSrc: Temporal  SpO2: 100%  Weight: 189 lb (85.7 kg)  Height: 5\' 9"  (1.753 m)   GENERAL: The patient is a well-nourished male, in no acute distress. The vital signs are documented above.  CARDIAC: There is a regular rate and rhythm.  VASCULAR:  Palpable left radial pulse at the wrist 1+  Thrill in the left basilic vein fistula  Left arm incision well healed  DATA:  Reviewed his vein mapping and the basilic vein looks very small down by the artery anastomosis  Assessment/Plan:  54 year old male with end-stage renal disease that presents for postop check after left first stage basilic vein fistla on 04/04/5954. He has a nice thrill on exam and he is having no steal symptoms in the hand. Unfortunately after review of fistula duplex today the fistula is small down by the artery anastomosis. Discussed that would likely require conversion to a graft in the left arm but would reevaluate in operating room with ultrasound before ligating the fistula for failure to mature. Discussed alternative would be moving to his right arm but he wants to avoid his right arm given he is right-handed. Will arrange on a nondialysis day which will be Thursday in the near future. Risks and benefits discussed.  Marty Heck, MD  [facility] [place]  Office: [phone]

## 2019-05-01 NOTE — Discharge Instructions (Signed)
° °  [facility] [place] ° °Discharge Instructions ° °AV Fistula or Graft Surgery for Dialysis Access ° °Please refer to the following instructions for your post-procedure care. Your surgeon or physician assistant will discuss any changes with you. ° °Activity ° °You may drive the day following your surgery, if you are comfortable and no longer taking prescription pain medication. Resume full activity as the soreness in your incision resolves. ° °Bathing/Showering ° °You may shower after you go home. Keep your incision dry for 48 hours. Do not soak in a bathtub, hot tub, or swim until the incision heals completely. You may not shower if you have a hemodialysis catheter. ° °Incision Care ° °Clean your incision with mild soap and water after 48 hours. Pat the area dry with a clean towel. You do not need a bandage unless otherwise instructed. Do not apply any ointments or creams to your incision. You may have skin glue on your incision. Do not peel it off. It will come off on its own in about one week. Your arm may swell a bit after surgery. To reduce swelling use pillows to elevate your arm so it is above your heart. Your doctor will tell you if you need to lightly wrap your arm with an ACE bandage. ° °Diet ° °Resume your normal diet. There are not special food restrictions following this procedure. In order to heal from your surgery, it is CRITICAL to get adequate nutrition. Your body requires vitamins, minerals, and protein. Vegetables are the best source of vitamins and minerals. Vegetables also provide the perfect balance of protein. Processed food has little nutritional value, so try to avoid this. ° °Medications ° °Resume taking all of your medications. If your incision is causing pain, you may take over-the counter pain relievers such as acetaminophen (Tylenol). If you were prescribed a stronger pain medication, please be aware these medications can cause nausea and constipation. Prevent  nausea by taking the medication with a snack or meal. Avoid constipation by drinking plenty of fluids and eating foods with high amount of fiber, such as fruits, vegetables, and grains. Do not take Tylenol if you are taking prescription pain medications. ° ° ° ° °Follow up °Your surgeon may want to see you in the office following your access surgery. If so, this will be arranged at the time of your surgery. ° °Please call us immediately for any of the following conditions: ° °Increased pain, redness, drainage (pus) from your incision site °Fever of 101 degrees or higher °Severe or worsening pain at your incision site °Hand pain or numbness. ° °Reduce your risk of vascular disease: ° °Stop smoking. If you would like help, call [facility] at 1-800-QUIT-NOW (1-800-784-8669) or [facility] at 336-586-4000 ° °Manage your cholesterol °Maintain a desired weight °Control your diabetes °Keep your blood pressure down ° °Dialysis ° °It will take several weeks to several months for your new dialysis access to be ready for use. Your surgeon will determine when it is OK to use it. Your nephrologist will continue to direct your dialysis. You can continue to use your Permcath until your new access is ready for use. ° °If you have any questions, please call the office at [phone]. ° °

## 2019-05-01 NOTE — Transfer of Care (Signed)
Immediate Anesthesia Transfer of Care Note  Patient: Vernon Blackburn  Procedure(s) Performed: LEFT SECOND STAGE [place]. (Left Arm Lower)  Patient Location: PACU  Anesthesia Type:MAC  Level of Consciousness: awake, alert  and oriented  Airway & Oxygen Therapy: Patient Spontanous Breathing  Post-op Assessment: Report given to RN and Post -op Vital signs reviewed and stable  Post vital signs: Reviewed and stable  Last Vitals:  Vitals Value Taken Time  BP 128/88 05/01/19 1418  Temp    Pulse 76 05/01/19 1420  Resp 17 05/01/19 1420  SpO2 100 % 05/01/19 1420  Vitals shown include unvalidated device data.  Last Pain:  Vitals:   05/01/19 1418  TempSrc:   PainSc: (P) 0-No pain      Patients Stated Pain Goal: 2 (26/41/58 3094)  Complications: No apparent anesthesia complications

## 2019-05-01 NOTE — Anesthesia Procedure Notes (Signed)
Procedure Name: MAC Date/Time: 05/01/2019 12:30 PM Performed by: Inda Coke, CRNA Pre-anesthesia Checklist: Patient identified, Emergency Drugs available, Suction available, Timeout performed and Patient being monitored Patient Re-evaluated:Patient Re-evaluated prior to induction Oxygen Delivery Method: Simple face mask Induction Type: IV induction Dental Injury: Teeth and Oropharynx as per pre-operative assessment

## 2019-05-07 ENCOUNTER — Emergency Department (HOSPITAL_COMMUNITY)
Admission: EM | Admit: 2019-05-07 | Discharge: 2019-05-07 | Disposition: A | Discharge status: Home / Self Care | Payer: Medicare Other | Attending: Emergency Medicine | Admitting: Emergency Medicine

## 2019-05-07 ENCOUNTER — Other Ambulatory Visit: Payer: Self-pay

## 2019-05-07 ENCOUNTER — Encounter (HOSPITAL_COMMUNITY): Payer: Self-pay | Admitting: *Deleted

## 2019-05-07 DIAGNOSIS — Z992 Dependence on renal dialysis: Secondary | ICD-10-CM | POA: Diagnosis not present

## 2019-05-07 DIAGNOSIS — N186 End stage renal disease: Secondary | ICD-10-CM | POA: Insufficient documentation

## 2019-05-07 DIAGNOSIS — Z79899 Other long term (current) drug therapy: Secondary | ICD-10-CM | POA: Diagnosis not present

## 2019-05-07 DIAGNOSIS — I132 Hypertensive heart and chronic kidney disease with heart failure and with stage 5 chronic kidney disease, or end stage renal disease: Secondary | ICD-10-CM | POA: Diagnosis not present

## 2019-05-07 DIAGNOSIS — I5022 Chronic systolic (congestive) heart failure: Secondary | ICD-10-CM | POA: Diagnosis not present

## 2019-05-07 DIAGNOSIS — Z452 Encounter for adjustment and management of vascular access device: Secondary | ICD-10-CM | POA: Diagnosis present

## 2019-05-07 NOTE — ED Triage Notes (Signed)
Pt reports he woke up this morning and he had bleeding around his dialysis catheter in his chest. He changed the dressing just prior to coming to the ED, no new drainage noted. He is [facility] dialysis.

## 2019-05-07 NOTE — ED Provider Notes (Signed)
[facility] DEPT Provider Note   CSN: [id number] Arrival date & time: 05/07/19  0234     History Chief Complaint  Patient presents with  . Vascular Access Problem    Vernon Blackburn is a 54 y.o. male with a history of ESRD on dialysis MWF, CHF, hypertension, sleep apnea, prior pulmonary embolism not currently anticoagulated who presents to the emergency department with concern for vascular access problem this AM. Patient states that he woke up to use the restroom this AM and noted some blood around his chest wall dialysis access site. He and his wife cleansed & re-dressed the area. It feels a bit sore, but has not had any recurrent bleeding. No alleviating/aggravating factors. He wanted to get this area checked out as he was concerned it may not be functional. Denies purulent drainage, redness, fever, chills, chest pain, dyspnea, or peripheral edema. Last dialyzed 05/03.   HPI     Past Medical History:  Diagnosis Date  . ADHD   . Allergy   . Anemia, chronic disease   . Arthritis    Gout  . Asthma   . Cardiomyopathy [facility])    cardiologist is [employer]? [name], lov in office per patient was last year in 2019   . CHF (congestive heart failure) ([place])    a. 01/2016: echo showing EF of 20-25%, no WMA, severe MR, and PA Peak Pressure of 52 mm Hg.   Marland Kitchen Chronic systolic HF (heart failure) ([place])   . CKD (chronic kidney disease)    Dr [name] ,  stage 4 ; per patient lov  in office march 2020 , per patient at this last visit he was not approved as a candidate for kidney transplant because his kidney fx has improved to greater  than 20  . Elbow pain, left   . Heart murmur   . History of kidney stones   . Hypertension   . Knee pain, bilateral   . Left ankle pain 11/04/2010   Gout  . Leg pain, bilateral   . Peripheral edema    Bilateral lower extremity   . Pneumonia    hx  . Pulmonary HTN ([place])   . S/P minimally invasive mitral valve repair 06/29/2016   30 mm [name] via right mini thoracotomy approach  . Severe mitral regurgitation   . Sleep apnea    hx bariatric  surgery 3 yrs ago lost 166 lbs  . Syncope and collapse 07/14/2016  . Venous insufficiency     Patient Active Problem List   Diagnosis Date Noted  . ESRD on dialysis ([place]) 04/22/2019  . Long term (current) use of anticoagulants [Z79.01] 07/17/2016  . Syncope and collapse 07/14/2016  . Acute pulmonary embolism ([place]) 07/14/2016  . Chest pain 07/13/2016  . Other chest pain   . Coagulopathy ([place])   . S/P minimally invasive mitral valve repair 06/29/2016  . AKI (acute kidney injury) ([place]) 06/07/2016  . CHF (congestive heart failure) ([place]) 06/06/2016  . Hypertensive heart and chronic kidney disease with heart failure and stage 1 through stage 4 chronic kidney disease, or chronic kidney disease ([place]) 02/27/2016  . Chronic systolic HF (heart failure) ([place])   . Cardiomyopathy ([place])   . Acute renal failure superimposed on chronic kidney disease ([place])   . Pulmonary HTN ([place])   . DOE (dyspnea on exertion) 01/02/2016  . Chronic kidney disease (CKD), stage IV (severe) ([place])   . Anemia   . Edema 11/15/2011  Past Surgical History:  Procedure Laterality Date  . AV FISTULA PLACEMENT Left 03/10/2019   Procedure: LEFT ARM BASILIC ARTERIOVENOUS (AV) FISTULA CREATION FIRST STAGE;  Surgeon: Marty Heck, MD;  Location: [place];  Service: Vascular;  Laterality: Left;  . BASCILIC VEIN TRANSPOSITION Left 05/01/2019   Procedure: LEFT SECOND STAGE BASCILIC VEIN TRANSPOSITION.;  Surgeon: Marty Heck, MD;  Location: [place];  Service: Vascular;  Laterality: Left;  . CYSTOSCOPY/URETEROSCOPY/HOLMIUM LASER/STENT PLACEMENT Right 07/17/2018   Procedure: CYSTOSCOPY/RETROGRADE/URETEROSCOPY/HOLMIUM LASER/STENT PLACEMENT/ CYSTOLITHALOPAXY;  Surgeon: Ceasar Mons, MD;  Location: [facility];  Service: Urology;  Laterality: Right;  . GASTRIC BYPASS  2014  . HERNIA  REPAIR    . MENISCUS REPAIR Right 05/2008   right  knee  . MITRAL VALVE REPAIR Right 06/29/2016   Procedure: MINIMALLY INVASIVE MITRAL VALVE REPAIR  (MVR);  Surgeon: [name], MD;  Location: [place];  Service: Open Heart Surgery;  Laterality: Right;  . RIGHT/LEFT HEART CATH AND CORONARY ANGIOGRAPHY N/A 06/07/2016   Procedure: Right/Left Heart Cath and Coronary Angiography;  Surgeon: Larey Dresser, MD;  Location: [place] CV [facility];  Service: Cardiovascular;  Laterality: N/A;  . TEE WITHOUT CARDIOVERSION N/A 05/04/2016   Procedure: TRANSESOPHAGEAL ECHOCARDIOGRAM (TEE);  Surgeon: Larey Dresser, MD;  Location: [facility] ENDOSCOPY;  Service: Cardiovascular;  Laterality: N/A;  . TEE WITHOUT CARDIOVERSION N/A 06/29/2016   Procedure: TRANSESOPHAGEAL ECHOCARDIOGRAM (TEE);  Surgeon: [name], MD;  Location: [place];  Service: Open Heart Surgery;  Laterality: N/A;  . TEE WITHOUT CARDIOVERSION N/A 03/06/2019   Procedure: TRANSESOPHAGEAL ECHOCARDIOGRAM (TEE);  Surgeon: Larey Dresser, MD;  Location[facility];  Service: Cardiovascular;  Laterality: N/A;       Family History  Problem Relation Age of Onset  . Diabetes Mother   . Hypertension Mother   . Heart failure Mother   . Colon cancer Neg Hx   . Esophageal cancer Neg Hx   . Rectal cancer Neg Hx   . Stomach cancer Neg Hx     Social History   Tobacco Use  . Smoking status: Never Smoker  . Smokeless tobacco: Never Used  Substance Use Topics  . Alcohol use: Not Currently  . Drug use: No    Home Medications Prior to Admission medications   Medication Sig Start Date End Date Taking? Authorizing Provider  albuterol (PROVENTIL HFA;VENTOLIN HFA) 108 (90 Base) MCG/ACT inhaler Inhale 2 puffs into the lungs every 6 (six) hours as needed for wheezing or shortness of breath. 06/11/17   Larey Dresser, MD  aspirin EC 81 MG tablet Take 1 tablet (81 mg total) by mouth daily. Patient taking differently: Take 81 mg by mouth in the morning and  at bedtime.  10/03/16   Larey Dresser, MD  bismuth subsalicylate (PEPTO BISMOL) 262 MG/15ML suspension Take 30 mLs by mouth every 6 (six) hours as needed for indigestion.    [provider]  Calcium Acetate 667 MG TABS Take 667 mg by mouth with breakfast, with lunch, and with evening meal.    [provider]  calcium elemental as carbonate (TUMS ULTRA 1000) 400 MG chewable tablet Chew 3,000 mg by mouth in the morning and at bedtime.    [provider]  carvedilol (COREG) 12.5 MG tablet Take 12.5 mg by mouth in the morning and at bedtime.    [provider]  febuxostat (ULORIC) 40 MG tablet Take 40 mg by mouth daily.  01/23/19   [provider]  fluticasone (FLONASE) 50 MCG/ACT nasal spray Place 1 spray into both nostrils daily as needed for allergies or rhinitis.     [provider]  HYDROcodone-acetaminophen (NORCO) 5-325 MG tablet Take 1 tablet by mouth every 6 (six) hours as needed (severe gout pain). 05/01/19   Dagoberto Ligas, PA-C  multivitamin (RENA-VIT) TABS tablet Take 1 tablet by mouth daily.    [provider]  predniSONE (DELTASONE) 10 MG tablet Take 10-40 mg by mouth See admin instructions. Take 40 mg for 2 days, 30 mg for 2 days, then 20 mg for 2 days, then 10 mg for 2 days then stop    [provider]  sevelamer carbonate (RENVELA) 800 MG tablet Take 800 mg by mouth 3 (three) times daily with meals.    [provider]  sildenafil (REVATIO) 20 MG tablet Take 20 mg by mouth as needed (ED).     [provider]  tamsulosin (FLOMAX) 0.4 MG CAPS capsule Take 0.4 mg by mouth in the morning and at bedtime.     [provider]    Allergies    Isosorbide  Review of Systems   Review of Systems  Constitutional: Negative for chills and fever.  Respiratory: Negative for cough and shortness of breath.   Cardiovascular: Negative for chest pain and leg swelling.  Gastrointestinal: Negative for  abdominal pain and vomiting.  Skin:       Positive for dialysis catheter bleeding and discomfort.   Neurological: Negative for syncope.  All other systems reviewed and are negative.   Physical Exam Updated Vital Signs BP (!) 122/98   Pulse 78   Temp 98.1 F (36.7 C) (Oral)   Resp 18   Ht 5\' 9"  (1.753 m)   Wt 85.7 kg   SpO2 99%   BMI 27.91 kg/m   Physical Exam Vitals and nursing note reviewed.  Constitutional:      General: He is not in acute distress.    Appearance: He is well-developed. He is not toxic-appearing.  HENT:     Head: Normocephalic and atraumatic.  Eyes:     General:        Right eye: No discharge.        Left eye: No discharge.     Conjunctiva/sclera: Conjunctivae normal.  Cardiovascular:     Rate and Rhythm: Normal rate and regular rhythm.  Pulmonary:     Effort: Pulmonary effort is normal. No respiratory distress.     Breath sounds: Normal breath sounds. No wheezing, rhonchi or rales.  Chest:     Comments: Right chest wall dialysis catheter present, secured by sutures. No bleeding. No surrounding erythema or purulent drainage. No significant tenderness to palpation.  Abdominal:     General: There is no distension.     Palpations: Abdomen is soft.     Tenderness: There is no abdominal tenderness.  Musculoskeletal:     Cervical back: Neck supple.  Skin:    General: Skin is warm and dry.     Findings: No rash.  Neurological:     Mental Status: He is alert.     Comments: Clear speech.   Psychiatric:        Behavior: Behavior normal.     ED Results / Procedures / Treatments   Labs (all labs ordered are listed, but only abnormal results are displayed) Labs Reviewed - No data to display  EKG None  Radiology No results found.  Procedures Procedures (including critical care time)  Medications Ordered  in ED Medications - No data to display  ED Course  I have reviewed the triage vital signs and the nursing notes.  Pertinent labs &  imaging results that were available during my care of the patient were reviewed by me and considered in my medical decision making (see chart for details).    MDM Rules/Calculators/A&P                     Patient presents to the ED for evaluation of his dialysis catheter after some bleeding and mild discomfort to the site this AM. Patient is nontoxic, no apparent distress noted, vitals WNL with the exception of mildly elevated diastolic BP- low suspicion for HTN emergency. Chart reviewed for additional history. Right chest wall dialysis catheter appears to be clean & intact, no active bleeding, no signs of infection, no blood in lumen. Did not attempt access, will defer to dialysis/nephrology/vascular surgery, he has dialysis today at noon. Overall patient appears appropriate for discharge home to go to dialysis today. I discussed plan, need for follow-up, and return precautions with the patient. Provided opportunity for questions, patient confirmed understanding and is in agreement with plan.   This is a shared visit with supervising physician Dr. Roxanne Mins who has independently evaluated patient & provided guidance in evaluation/management/disposition, in agreement with care    Final Clinical Impression(s) / ED Diagnoses Final diagnoses:  ESRD (end stage renal disease) [facility])    Rx / DC Orders ED Discharge Orders    None       Amaryllis Dyke, PA-C 04/59/97 [id number]    Delora Fuel, MD 23/95/32 [phone]

## 2019-05-07 NOTE — ED Notes (Signed)
[place] signature pad not functioning in patient's room. Patient verbalized understanding of discharge.

## 2019-05-07 NOTE — Discharge Instructions (Addendum)
You were seen in the ER today for evaluation of your dialysis catheter. The site appears clean and appropriate in the ER. Please go to to dialysis today as scheduled.   Return to the ER for new or worsening symptoms including but not limited to recurrent bleeding, fever, drainage from the site, redness around the site, or any other concerns.

## 2019-05-16 ENCOUNTER — Encounter (HOSPITAL_COMMUNITY): Payer: Medicaid Other | Admitting: Cardiology

## 2019-06-03 ENCOUNTER — Telehealth (HOSPITAL_COMMUNITY): Payer: Self-pay

## 2019-06-03 NOTE — Telephone Encounter (Signed)
Received a fax for medical records from the [facility] dates ranging from 01/02/2017-01/02/2020. Requested documents were successfully faxed to [phone]. Forms will be scanned into patients chart.

## 2019-06-17 ENCOUNTER — Ambulatory Visit: Payer: Medicaid Other

## 2019-06-17 NOTE — Progress Notes (Deleted)
    Postoperative Access Visit   History of Present Illness   Vernon Blackburn is a 54 y.o. year old male who presents for postoperative follow-up for left second stage basilic vein transposition (brachiobasilic arteriovenous fistula) placement on 05/01/19 by Dr. Carlis Abbott.  The patient's wounds are *** healed.  The patient notes *** steal symptoms.  The patient is *** able to complete their activities of daily living.  The patient's current symptoms are: ***.   Physical Examination  There were no vitals filed for this visit. There is no height or weight on file to calculate BMI.  {side of body:30421359} arm Incision is *** healed, *** radial pulse, hand grip is ***/5, sensation in digits is *** intact, ***palpable thrill, bruit can *** be auscultated     Medical Decision Making   Vernon Blackburn is a 54 y.o. year old male who presents s/p {side of body:30421359} {AccessOptions:19197::"thigh arteriovenous graft","upper arm arteriovenous graft","forearm arteriovenous graft","first stage brachial vein transposition","second stage brachial vein transposition","single stage brachial vein transposition","first stage basilic vein transposition","second stage basilic vein transposition","single stage basilic vein transposition","radiocephalic arteriovenous fistula","brachiocephalic arteriovenous fistula"}   Patent *** without signs or symptoms of steal syndrome  The patient's access will be ready for use ***  ***The patient's tunneled dialysis catheter can be removed when Nephrology is comfortable with the performance of the ***  The patient may follow up on a prn basis   Karoline Caldwell, PA-C [facility] [place] Office: [facility] MD: ***

## 2019-06-23 DIAGNOSIS — I272 Pulmonary hypertension, unspecified: Secondary | ICD-10-CM

## 2019-06-24 ENCOUNTER — Other Ambulatory Visit: Payer: Self-pay

## 2019-06-24 ENCOUNTER — Ambulatory Visit (INDEPENDENT_AMBULATORY_CARE_PROVIDER_SITE_OTHER): Payer: Self-pay | Admitting: Physician Assistant

## 2019-06-24 ENCOUNTER — Encounter (HOSPITAL_COMMUNITY): Payer: Medicaid Other | Admitting: Cardiology

## 2019-06-24 VITALS — BP 95/70 | HR 89 | Temp 97.8°F | Resp 20 | Ht 69.0 in | Wt 194.6 lb

## 2019-06-24 DIAGNOSIS — N186 End stage renal disease: Secondary | ICD-10-CM

## 2019-06-24 DIAGNOSIS — Z992 Dependence on renal dialysis: Secondary | ICD-10-CM

## 2019-06-24 NOTE — Progress Notes (Signed)
POST OPERATIVE OFFICE NOTE    CC:  F/u for surgery  HPI:  This is a 54 y.o. male who is s/p left upper extremity second stage basilic vein transposition on 05/01/2019 by Dr. Carlis Abbott.  He reports some intermittent left hand cramping; denies pain or numbness.  He is dialyzing via right IJ tunneled dialysis catheter without complications.  He denies fever or chills.  He dialyzes Mondays Wednesdays and Fridays at [facility] on [address].   He states he is considering peritoneal dialysis and has undergone interview and education.  He is also in the process of evaluation for kidney transplantation.  Allergies  Allergen Reactions  . Isosorbide     Hypotension     Current Outpatient Medications  Medication Sig Dispense Refill  . albuterol (PROVENTIL HFA;VENTOLIN HFA) 108 (90 Base) MCG/ACT inhaler Inhale 2 puffs into the lungs every 6 (six) hours as needed for wheezing or shortness of breath. 1 Inhaler 2  . aspirin EC 81 MG tablet Take 1 tablet (81 mg total) by mouth daily. (Patient taking differently: Take 81 mg by mouth in the morning and at bedtime. ) 90 tablet 3  . bismuth subsalicylate (PEPTO BISMOL) 262 MG/15ML suspension Take 30 mLs by mouth every 6 (six) hours as needed for indigestion.    . Calcium Acetate 667 MG TABS Take 667 mg by mouth with breakfast, with lunch, and with evening meal.    . calcium elemental as carbonate (TUMS ULTRA 1000) 400 MG chewable tablet Chew 3,000 mg by mouth in the morning and at bedtime.    . carvedilol (COREG) 12.5 MG tablet Take 12.5 mg by mouth in the morning and at bedtime.    . febuxostat (ULORIC) 40 MG tablet Take 40 mg by mouth daily.     . fluticasone (FLONASE) 50 MCG/ACT nasal spray Place 1 spray into both nostrils daily as needed for allergies or rhinitis.     . predniSONE (DELTASONE) 10 MG tablet Take by mouth.    . sevelamer carbonate (RENVELA) 800 MG tablet Take 800 mg by mouth 3 (three) times daily with meals.    . tamsulosin (FLOMAX) 0.4 MG  CAPS capsule Take 0.4 mg by mouth in the morning and at bedtime.     Marland Kitchen VYVANSE 30 MG capsule Take 30 mg by mouth every morning.     No current facility-administered medications for this visit.     ROS:  See HPI  BP 95/70 (BP Location: Right Arm, Patient Position: Sitting, Cuff Size: Large)   Pulse 89   Temp 97.8 F (36.6 C) (Temporal)   Resp 20   Ht 5\' 9"  (1.753 m)   Wt 194 lb 9.6 oz (88.3 kg)   SpO2 96%   BMI 28.74 kg/m   Physical Exam:  General appearance: Well-developed, well-nourished male in no apparent distress Cardiac: Rhythm and rate are regular Respiratory: Nonlabored Incision: Upper extremity incisions are all well-healed Extremities: 2+ radial, 1+ ulnar palpable pulse on the left.  Good bruit and thrill in fistula.  It is easily palpable along its course in the upper arm. Motor and sensation intact. Neuro: Alert and oriented x4.  No deficits   Assessment/Plan:  This is a 54 y.o. male who is s/p: Left upper extremity basilic vein fistula transposition.  His fistula is easily palpable and well matured. He is now 15 weeks post-op from his initial AVF creation. Okay to begin access.  TDC removal at discretion of his nephrology team.  Follow-up as needed.  Katharine Look  Vaughan Basta, PA-C [facility] [phone]  Clinic MD: Carlis Abbott

## 2019-07-15 ENCOUNTER — Ambulatory Visit (HOSPITAL_COMMUNITY)
Admission: RE | Admit: 2019-07-15 | Discharge: 2019-07-15 | Disposition: A | Discharge status: Home / Self Care | Payer: Medicare Other | Source: Ambulatory Visit | Attending: Cardiology | Admitting: Cardiology

## 2019-07-15 ENCOUNTER — Other Ambulatory Visit: Payer: Self-pay

## 2019-07-15 ENCOUNTER — Encounter (HOSPITAL_COMMUNITY): Payer: Self-pay | Admitting: Cardiology

## 2019-07-15 VITALS — BP 112/88 | HR 90 | Wt 197.0 lb

## 2019-07-15 DIAGNOSIS — I420 Dilated cardiomyopathy: Secondary | ICD-10-CM | POA: Diagnosis not present

## 2019-07-15 DIAGNOSIS — Z8673 Personal history of transient ischemic attack (TIA), and cerebral infarction without residual deficits: Secondary | ICD-10-CM | POA: Insufficient documentation

## 2019-07-15 DIAGNOSIS — N186 End stage renal disease: Secondary | ICD-10-CM | POA: Insufficient documentation

## 2019-07-15 DIAGNOSIS — I052 Rheumatic mitral stenosis with insufficiency: Secondary | ICD-10-CM | POA: Insufficient documentation

## 2019-07-15 DIAGNOSIS — Z8249 Family history of ischemic heart disease and other diseases of the circulatory system: Secondary | ICD-10-CM | POA: Diagnosis not present

## 2019-07-15 DIAGNOSIS — J45909 Unspecified asthma, uncomplicated: Secondary | ICD-10-CM | POA: Diagnosis not present

## 2019-07-15 DIAGNOSIS — Z79899 Other long term (current) drug therapy: Secondary | ICD-10-CM | POA: Diagnosis not present

## 2019-07-15 DIAGNOSIS — Z952 Presence of prosthetic heart valve: Secondary | ICD-10-CM | POA: Diagnosis not present

## 2019-07-15 DIAGNOSIS — Z7982 Long term (current) use of aspirin: Secondary | ICD-10-CM | POA: Diagnosis not present

## 2019-07-15 DIAGNOSIS — Z992 Dependence on renal dialysis: Secondary | ICD-10-CM | POA: Diagnosis not present

## 2019-07-15 DIAGNOSIS — Z9889 Other specified postprocedural states: Secondary | ICD-10-CM

## 2019-07-15 DIAGNOSIS — I5022 Chronic systolic (congestive) heart failure: Secondary | ICD-10-CM | POA: Diagnosis present

## 2019-07-15 DIAGNOSIS — Z7952 Long term (current) use of systemic steroids: Secondary | ICD-10-CM | POA: Insufficient documentation

## 2019-07-15 DIAGNOSIS — I132 Hypertensive heart and chronic kidney disease with heart failure and with stage 5 chronic kidney disease, or end stage renal disease: Secondary | ICD-10-CM | POA: Insufficient documentation

## 2019-07-15 NOTE — Patient Instructions (Signed)
It was great to see you today! No medication changes are needed at this time.  Your physician recommends that you schedule a follow-up appointment in: 6 months with Dr Aundra Dubin

## 2019-07-16 NOTE — Progress Notes (Signed)
Heart Failure  Note  ID:  Vernon, Blackburn 11-23-1965, MRN [id number] Provider location: [facility] Type of Visit: Established patient   PCP:  Vernon Dress, MD  Cardiologist:  Vernon Blackburn   History of Present Illness: Vernon Blackburn is a 54 y.o. male who has a history of long-standing, poorly-controlled HTN, CKD stage 3-4, chronic systolic CHF and severe mitral regurgitation.  He presents for followup of CHF and HTN.  He has had hypertension for years.  He has had known CKD, followed by Dr. Justin Blackburn.  He developed severe dyspnea in 12/17 and was admitted with acute systolic CHF.  Echo showed EF 20-25% with severe MR.  He was diuresed and discharged.   TEE was done in 5/18 to assess mitral valve.  There was severe MR.  The MV was thickened and did not coapt well.  Suspect there was a component of secondary MR with moderate LV dilation, however the thickened leaflets suggested a component of primary MR as well.   He had right and left heart cath in 6/18.  No significant CAD, elevated filling pressures.    In 6/18, he had mitral valve repair.  Post-op echo in 7/18 showed EF 25-30% with stable MV repair (mild MR, mean gradient 4 mmHg).  Echo 12/18 with EF 30-35%, moderate LV dilation, s/p MVR repair with trivial MR.  Echo in 4/19 showed EF up to 40-45% with stable repaired mitral valve.   He had an echo in 8/20 with EF 35%, mild LV dilation, s/p MV repair with mean gradient 4 mmHg and trivial MR, mildly decreased RV systolic function.   He was admitted in 2/21 to [facility] with worsening renal function and HD was started.  Echo at [facility] in 2/21 showed EF 40-45%, mild LV dilation, moderate LVH, "severe mitral stenosis" with mean gradient 7 mmHg and eccentric mild MR.  Head imagining showed an old CVA.   TEE was done to followup on ?severe mitral stenosis in 3/21.  This showed EF 45%, moderate LVH, global mild hypokinesis, mildly decreased RV systolic function; s/p MV repair,  mild MR with mean gradient 4 mmHg, no significant mitral stenosis.   He returns for followup of CHF.  He is getting HD MWF.  He is tolerating HD well. No significant exertional dyspnea.  No chest pain.  No orthopnea/PND.   Hydralazine/Imdur were stopped due to hypotension with dialysis.   He will be discussing peritoneal dialysis with Dr. Joelyn Blackburn, and it sounds like he will be going to [facility] for kidney transplant evaluation.   Labs (4/18): K 3.7, creatinine 3.05, hgb 10.4 Labs (5/18): K 3.3, creatinine 2.89, BNP 789 Labs (6/18): K 3.4, creatinine 2.4, hgb 10.1 Labs (7/18): K 3.2, creatinine 2.27, BNP 303 Labs (9/18): LDL 78, HDL 56, K 3.8, creatinine 2.16 Labs (10/18): hgb 10.1 Labs (2/19): K 3.3, creatinine 2.75, hgb 12.1 Labs (5/19): K 3.6, creatinine 3.07 Labs (7/20): K 4, creatinine 3.55, hgb 12.8 Labs (2/21): LDL 127  PMH: 1. Gout 2. HTN: Long-standing, poor control. 3. ESRD.  Likely hypertensive nephropathy.  4. Dilated cardiomyopathy: May be due to long-standing HTN.  - Echo (1/18): EF 20-25%, severe MR - Echo (2/18): EF 25%, severe MR.  - TEE (5/18): Moderate LV dilation with severe global hypokinesis, EF 25-30%, normal RV size with mildly decreased systolic function, RV-RA gradient 55 mmHg, severe MR with mildly thickened leaflets with inadequate coaptation and ERO 0.52 cm^2 by PISA and systolic flow reversal in  the pulmonary vein doppler pattern => secondary MR from dilated LV but thickened leaflets lead to concern for component of primary MR as well.  - RHC/LHC (6/18): No significant CAD.  Mean RA 15, PA 76/31 mean 48, mean PCWP 45, CI 3.02 Fick, 2.64 thermo.  - Echo (7/18): EF 25-30%, mild dilation, diffuse hypokinesis, s/p MV repair with mean gradient 4 mmHg and mild MR, PASP 37 mmHg, normal RV size and systolic function.  - Echo (12/18): EF 30-35%, moderate LV dilation with diffuse hypokinesis, stable MV repair.  - Echo (4/19): EF 40-45%, severe LV dilation, s/p mitral valve  repair with mean gradient 4 mmHg, no mitral regurgitation, severe RV dilation with normal systolic function.  - Echo (8/20): EF 35%, mild LV dilation, s/p MV repair with mean gradient 4 mmHg and trivial MR, mildly decreased RV systolic function.  - Echo (2/21, [facility]):  EF 40-45%, mild LV dilation, moderate LVH, "severe mitral stenosis" with mean gradient 7 mmHg and eccentric mild MR. - TEE (3/21): EF 45%, moderate LVH, global mild hypokinesis, mildly decreased RV systolic function; s/p MV repair, mild MR with mean gradient 4 mmHg, no significant mitral stenosis.  5. Severe MR: S/p MV repair in 6/18. 6. H/o CVA  SH: Married, lives in [place], nonsmoker, no drugs, occasional ETOH.  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   Family History  Problem Relation Age of Onset  . Diabetes Mother   . Hypertension Mother   . Heart failure Mother   . Colon cancer Neg Hx   . Esophageal cancer Neg Hx   . Rectal cancer Neg Hx   . Stomach cancer Neg Hx    ROS: All systems reviewed and negative except as per HPI.   Current Outpatient Medications  Medication Sig Dispense Refill  . albuterol (PROVENTIL HFA;VENTOLIN HFA) 108 (90 Base) MCG/ACT inhaler Inhale 2 puffs into the lungs every 6 (six) hours as needed for wheezing or shortness of breath. 1 Inhaler 2  . aspirin EC 81 MG tablet Take 1 tablet (81 mg total) by mouth daily. (Patient taking differently: Take 81 mg by mouth in the morning and at bedtime. ) 90 tablet 3  . bismuth subsalicylate (PEPTO BISMOL) 262 MG/15ML suspension Take 30 mLs by mouth every 6 (six) hours as needed for indigestion.    . calcium elemental as carbonate (TUMS ULTRA 1000) 400 MG chewable tablet Chew 3,000 mg by mouth in the morning and at bedtime.    . carvedilol (COREG) 12.5 MG tablet Take 12.5 mg by mouth in the morning and at bedtime.    . febuxostat (ULORIC) 40 MG tablet Take 40 mg by mouth daily.     . fluticasone (FLONASE) 50 MCG/ACT nasal spray Place  1 spray into both nostrils daily as needed for allergies or rhinitis.     . predniSONE (DELTASONE) 10 MG tablet Take by mouth.    . sevelamer carbonate (RENVELA) 800 MG tablet Take 800 mg by mouth 3 (three) times daily with meals.    . tamsulosin (FLOMAX) 0.4 MG CAPS capsule Take 0.4 mg by mouth in the morning and at bedtime. Take 1 tab bid    . VYVANSE 30 MG capsule Take 30 mg by mouth every morning.     No current facility-administered medications for this encounter.   Exam:   BP 112/88   Pulse 90   Wt 89.4 kg (197 lb)   SpO2 96%   BMI 29.09 kg/m  General: NAD Neck: No  JVD, no thyromegaly or thyroid nodule.  Lungs: Clear to auscultation bilaterally with normal respiratory effort. CV: Nondisplaced PMI.  Heart regular S1/S2, no S3/S4, 2/6 HSM apex.  No peripheral edema.  No carotid bruit.  Normal pedal pulses.  Abdomen: Soft, nontender, no hepatosplenomegaly, no distention.  Skin: Intact without lesions or rashes.  Neurologic: Alert and oriented x 3.  Psych: Normal affect. Extremities: No clubbing or cyanosis.  HEENT: Normal.   Assessment/Plan: 1. HTN: Possible hypertensive cardiomyopathy and hypertensive nephropathy.   2. ESRD: Tolerating HD so far.  Wants to eventually do PD, to discuss with Dr. Joelyn Blackburn. Also looking into renal transplant but has been turned down by [facility] due to cardiac issues. He is going to be seen at [facility].     3. Chronic systolic CHF: Echo in 3/81 with EF 25-30%. Nonischemic cardiomyopathy based on cath in 6/18.  Cause of cardiomyopathy may be long-standing, poorly controlled HTN.  NYHA class II symptoms.  He was initially improved symptomatically s/p MV repair.  Echo in 4/19 with EF 40-45%. Echo at [facility] in 2/21 showed EF 40-45%.   TEE in 3/21 with EF 45%.  NYHA class II.  - Continue Coreg 6.25 mg bid. - Unable to add additional meds to to fall in BP with HD.  4. Mitral regurgitation: s/p MV repair for severe MR.  Much improved symptomatically s/p surgery.  Echo at [facility] in 2/21 suggested "severe" mitral stenosis with mean gradient 7 mmHg and eccentric MR, ?mild.  However, TEE here in 3/21 showed stable MV repair with mild MR and no significant mitral stenosis.  - Will need abx with dental work given prosthetic material.  5. Asthma: Occasional wheezing, not exertional.   6. CVA: Prior CVA by head imaging.    Followup in 6 months.   Signed, Loralie Champagne, MD  07/16/2019  [facility] [address] [facility] [place] [address] [phone] (office) [phone] (fax)

## 2019-08-03 ENCOUNTER — Emergency Department (HOSPITAL_COMMUNITY)
Admission: EM | Admit: 2019-08-03 | Discharge: 2019-08-03 | Discharge status: Against Medical Advice | Payer: Medicare Other

## 2019-08-03 ENCOUNTER — Other Ambulatory Visit: Payer: Self-pay

## 2019-11-14 ENCOUNTER — Telehealth (HOSPITAL_COMMUNITY): Payer: Self-pay | Admitting: Cardiology

## 2019-11-14 DIAGNOSIS — N184 Chronic kidney disease, stage 4 (severe): Secondary | ICD-10-CM

## 2019-11-14 DIAGNOSIS — I5022 Chronic systolic (congestive) heart failure: Secondary | ICD-10-CM

## 2019-11-14 NOTE — Telephone Encounter (Signed)
Pt called to scheduled a CPX, he stated it's a part of his kidney transplant process, pt can be reached [phone]. Please advise

## 2019-11-14 NOTE — Telephone Encounter (Signed)
Per Vo Dr Maceo Pro to order cpx for transplant work up

## 2019-11-14 NOTE — Telephone Encounter (Signed)
Pt called to scheduled a CPX, he stated it's a part of his kidney transplant process, pt can be reached [phone]. Please advise

## 2019-11-18 ENCOUNTER — Ambulatory Visit (HOSPITAL_COMMUNITY): Payer: Medicare Other | Attending: Internal Medicine

## 2019-11-18 ENCOUNTER — Other Ambulatory Visit: Payer: Self-pay

## 2019-11-18 DIAGNOSIS — N184 Chronic kidney disease, stage 4 (severe): Secondary | ICD-10-CM | POA: Insufficient documentation

## 2019-11-18 DIAGNOSIS — I5022 Chronic systolic (congestive) heart failure: Secondary | ICD-10-CM | POA: Diagnosis present

## 2019-11-26 ENCOUNTER — Telehealth (HOSPITAL_COMMUNITY): Payer: Self-pay

## 2019-11-26 DIAGNOSIS — I5022 Chronic systolic (congestive) heart failure: Secondary | ICD-10-CM

## 2019-11-26 NOTE — Telephone Encounter (Signed)
-----   Message from Larey Dresser, MD sent at 11/26/2019  2:34 PM EST ----- Happy to see him in the office, but looks like primary issue is pulmonary.  Needs high resolution noncontrast CT chest and should have appointment in the pulmonary office ASAP to evaluate.

## 2019-12-01 ENCOUNTER — Telehealth: Payer: Self-pay | Admitting: Pulmonary Disease

## 2019-12-01 ENCOUNTER — Other Ambulatory Visit: Payer: Medicare Other

## 2019-12-01 DIAGNOSIS — Z20822 Contact with and (suspected) exposure to covid-19: Secondary | ICD-10-CM

## 2019-12-02 ENCOUNTER — Other Ambulatory Visit: Payer: Medicare Other

## 2019-12-02 LAB — SARS-COV-2, NAA 2 DAY TAT

## 2019-12-02 LAB — NOVEL CORONAVIRUS, NAA: SARS-CoV-2, NAA: NOT DETECTED

## 2019-12-03 ENCOUNTER — Other Ambulatory Visit: Payer: Self-pay

## 2019-12-03 ENCOUNTER — Ambulatory Visit (HOSPITAL_COMMUNITY)
Admission: RE | Admit: 2019-12-03 | Discharge: 2019-12-03 | Disposition: A | Discharge status: Home / Self Care | Payer: Medicare Other | Source: Ambulatory Visit | Attending: Cardiology | Admitting: Cardiology

## 2019-12-03 ENCOUNTER — Encounter (HOSPITAL_COMMUNITY): Payer: Self-pay | Admitting: Cardiology

## 2019-12-03 VITALS — BP 116/90 | HR 96 | Wt 189.6 lb

## 2019-12-03 DIAGNOSIS — N186 End stage renal disease: Secondary | ICD-10-CM | POA: Insufficient documentation

## 2019-12-03 DIAGNOSIS — I052 Rheumatic mitral stenosis with insufficiency: Secondary | ICD-10-CM | POA: Insufficient documentation

## 2019-12-03 DIAGNOSIS — I132 Hypertensive heart and chronic kidney disease with heart failure and with stage 5 chronic kidney disease, or end stage renal disease: Secondary | ICD-10-CM | POA: Insufficient documentation

## 2019-12-03 DIAGNOSIS — Z9889 Other specified postprocedural states: Secondary | ICD-10-CM | POA: Diagnosis not present

## 2019-12-03 DIAGNOSIS — Z7982 Long term (current) use of aspirin: Secondary | ICD-10-CM | POA: Insufficient documentation

## 2019-12-03 DIAGNOSIS — Z8249 Family history of ischemic heart disease and other diseases of the circulatory system: Secondary | ICD-10-CM | POA: Diagnosis not present

## 2019-12-03 DIAGNOSIS — I420 Dilated cardiomyopathy: Secondary | ICD-10-CM | POA: Diagnosis not present

## 2019-12-03 DIAGNOSIS — Z8673 Personal history of transient ischemic attack (TIA), and cerebral infarction without residual deficits: Secondary | ICD-10-CM | POA: Insufficient documentation

## 2019-12-03 DIAGNOSIS — Z7952 Long term (current) use of systemic steroids: Secondary | ICD-10-CM | POA: Diagnosis not present

## 2019-12-03 DIAGNOSIS — E785 Hyperlipidemia, unspecified: Secondary | ICD-10-CM | POA: Diagnosis not present

## 2019-12-03 DIAGNOSIS — Z992 Dependence on renal dialysis: Secondary | ICD-10-CM | POA: Insufficient documentation

## 2019-12-03 DIAGNOSIS — Z79899 Other long term (current) drug therapy: Secondary | ICD-10-CM | POA: Insufficient documentation

## 2019-12-03 DIAGNOSIS — J449 Chronic obstructive pulmonary disease, unspecified: Secondary | ICD-10-CM | POA: Diagnosis not present

## 2019-12-03 DIAGNOSIS — I5022 Chronic systolic (congestive) heart failure: Secondary | ICD-10-CM | POA: Insufficient documentation

## 2019-12-03 LAB — LIPID PANEL
Cholesterol: 161 mg/dL (ref 0–200)
HDL: 41 mg/dL (ref >40)
LDL Cholesterol: 100 mg/dL — ABNORMAL HIGH (ref 0–99)
Total CHOL/HDL Ratio: 3.9 RATIO
Triglycerides: 98 mg/dL (ref <150)
VLDL: 20 mg/dL (ref 0–40)

## 2019-12-03 NOTE — Patient Instructions (Addendum)
It was great to see you today! No medication changes are needed at this time.   Labs today We will only contact you if something comes back abnormal or we need to make some changes. Otherwise no news is good news!  Your physician recommends that you schedule a follow-up appointment in: 6 months with Dr Aundra Dubin -please give our office a call in May 2022 to schedule your appointment   If you have any questions or concerns before your next appointment please send [place] a message through [place] or call our office at [phone].    TO LEAVE A MESSAGE FOR THE NURSE SELECT OPTION 2, PLEASE LEAVE A MESSAGE INCLUDING: . YOUR NAME . DATE OF BIRTH . CALL BACK NUMBER . REASON FOR CALL**this is important as we prioritize the call backs  YOU WILL RECEIVE A CALL BACK THE SAME DAY AS LONG AS YOU CALL BEFORE 4:00 PM

## 2019-12-04 NOTE — Progress Notes (Signed)
Heart Failure  Note  ID:  Vernon, Blackburn [birth year], MRN [id number] Provider location: [facility] Type of Visit: Established patient   PCP:  Vernon Dress, MD  Cardiologist:  [name]   History of Present Illness: Vernon Blackburn is a 54 y.o. male who has a history of long-standing, poorly-controlled HTN, CKD stage 3-4, chronic systolic CHF and severe mitral regurgitation.  He presents for followup of CHF and HTN.  He has had hypertension for years.  He has had known CKD, followed by Dr. [name].  He developed severe dyspnea in 12/17 and was admitted with acute systolic CHF.  Echo showed EF 20-25% with severe MR.  He was diuresed and discharged.   TEE was done in 5/18 to assess mitral valve.  There was severe MR.  The MV was thickened and did not coapt well.  Suspect there was a component of secondary MR with moderate LV dilation, however the thickened leaflets suggested a component of primary MR as well.   He had right and left heart cath in 6/18.  No significant CAD, elevated filling pressures.    In 6/18, he had mitral valve repair.  Post-op echo in 7/18 showed EF 25-30% with stable MV repair (mild MR, mean gradient 4 mmHg).  Echo 12/18 with EF 30-35%, moderate LV dilation, s/p MVR repair with trivial MR.  Echo in 4/19 showed EF up to 40-45% with stable repaired mitral valve.   He had an echo in 8/20 with EF 35%, mild LV dilation, s/p MV repair with mean gradient 4 mmHg and trivial MR, mildly decreased RV systolic function.   He was admitted in 2/21 to [facility] with worsening renal function and HD was started.  Echo at [facility] in 2/21 showed EF 40-45%, mild LV dilation, moderate LVH, "severe mitral stenosis" with mean gradient 7 mmHg and eccentric mild MR.  Head imagining showed an old CVA.   TEE was done to followup on ?severe mitral stenosis in 3/21.  This showed EF 45%, moderate LVH, global mild hypokinesis, mildly decreased RV systolic function; s/p MV repair,  mild MR with mean gradient 4 mmHg, no significant mitral stenosis.  CPX in 11/21 showed peak VO2 17.7 (51% predicted), RER 1.12, VE/VCO2 slope 29.  Moderate functional limitation due to mixed restrictive/obstructive lung disease.   He returns for followup of CHF.  He is now getting PD and is being worked up at [facility] for renal transplant.  Generally, no dyspnea walking around on flat ground.  He has had some congestion recently and is being treated with levofloxacin for bronchitis.  No chest pain.  No orthopnea/PND.    Labs (4/18): K 3.7, creatinine 3.05, hgb 10.4 Labs (5/18): K 3.3, creatinine 2.89, BNP 789 Labs (6/18): K 3.4, creatinine 2.4, hgb 10.1 Labs (7/18): K 3.2, creatinine 2.27, BNP 303 Labs (9/18): LDL 78, HDL 56, K 3.8, creatinine 2.16 Labs (10/18): hgb 10.1 Labs (2/19): K 3.3, creatinine 2.75, hgb 12.1 Labs (5/19): K 3.6, creatinine 3.07 Labs (7/20): K 4, creatinine 3.55, hgb 12.8 Labs (2/21): LDL 127  ECG (personally reviewed): NSR, LVH  PMH: 1. Gout 2. HTN: Long-standing, poor control. 3. ESRD.  Likely hypertensive nephropathy.  4. Dilated cardiomyopathy: May be due to long-standing HTN.  - Echo (1/18): EF 20-25%, severe MR - Echo (2/18): EF 25%, severe MR.  - TEE (5/18): Moderate LV dilation with severe global hypokinesis, EF 25-30%, normal RV size with mildly decreased systolic function, RV-RA gradient 55  mmHg, severe MR with mildly thickened leaflets with inadequate coaptation and ERO 0.52 cm^2 by PISA and systolic flow reversal in the pulmonary vein doppler pattern => secondary MR from dilated LV but thickened leaflets lead to concern for component of primary MR as well.  - RHC/LHC (6/18): No significant CAD.  Mean RA 15, PA 76/31 mean 48, mean PCWP 45, CI 3.02 Fick, 2.64 thermo.  - Echo (7/18): EF 25-30%, mild dilation, diffuse hypokinesis, s/p MV repair with mean gradient 4 mmHg and mild MR, PASP 37 mmHg, normal RV size and systolic function.  - Echo (12/18): EF 30-35%,  moderate LV dilation with diffuse hypokinesis, stable MV repair.  - Echo (4/19): EF 40-45%, severe LV dilation, s/p mitral valve repair with mean gradient 4 mmHg, no mitral regurgitation, severe RV dilation with normal systolic function.  - Echo (8/20): EF 35%, mild LV dilation, s/p MV repair with mean gradient 4 mmHg and trivial MR, mildly decreased RV systolic function.  - Echo (2/21, [facility]):  EF 40-45%, mild LV dilation, moderate LVH, "severe mitral stenosis" with mean gradient 7 mmHg and eccentric mild MR. - TEE (3/21): EF 45%, moderate LVH, global mild hypokinesis, mildly decreased RV systolic function; s/p MV repair, mild MR with mean gradient 4 mmHg, no significant mitral stenosis.  - CPX (11/21): peak VO2 17.7 (51% predicted), RER 1.12, VE/VCO2 slope 29.  Moderate functional limitation due to mixed restrictive/obstructive lung disease.  5. Severe MR: S/p MV repair in 6/18. 6. H/o CVA  SH: Married, lives in [place], nonsmoker, no drugs, occasional ETOH.  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   Family History  Problem Relation Age of Onset  . Diabetes Mother   . Hypertension Mother   . Heart failure Mother   . Colon cancer Neg Hx   . Esophageal cancer Neg Hx   . Rectal cancer Neg Hx   . Stomach cancer Neg Hx    ROS: All systems reviewed and negative except as per HPI.   Current Outpatient Medications  Medication Sig Dispense Refill  . albuterol (PROVENTIL HFA;VENTOLIN HFA) 108 (90 Base) MCG/ACT inhaler Inhale 2 puffs into the lungs every 6 (six) hours as needed for wheezing or shortness of breath. 1 Inhaler 2  . aspirin EC 81 MG tablet Take 1 tablet (81 mg total) by mouth daily. (Patient taking differently: Take 81 mg by mouth in the morning and at bedtime. ) 90 tablet 3  . bismuth subsalicylate (PEPTO BISMOL) 262 MG/15ML suspension Take 30 mLs by mouth every 6 (six) hours as needed for indigestion.    . calcium elemental as carbonate (TUMS ULTRA 1000) 400  MG chewable tablet Chew 3,000 mg by mouth in the morning and at bedtime.    . carvedilol (COREG) 12.5 MG tablet Take 12.5 mg by mouth in the morning and at bedtime.    . febuxostat (ULORIC) 40 MG tablet Take 40 mg by mouth daily.     . fluticasone (FLONASE) 50 MCG/ACT nasal spray Place 1 spray into both nostrils daily as needed for allergies or rhinitis.     . predniSONE (DELTASONE) 10 MG tablet Take by mouth.    . sevelamer carbonate (RENVELA) 800 MG tablet Take 800 mg by mouth 3 (three) times daily with meals.    . tamsulosin (FLOMAX) 0.4 MG CAPS capsule Take 0.4 mg by mouth in the morning and at bedtime. Take 1 tab bid    . VYVANSE 30 MG capsule Take 30 mg by mouth  every morning.     No current facility-administered medications for this encounter.   Exam:   BP 116/90   Pulse 96   Wt 86 kg (189 lb 9.6 oz)   SpO2 96%   BMI 28.00 kg/m  General: NAD Neck: No JVD, no thyromegaly or thyroid nodule.  Lungs: Clear to auscultation bilaterally with normal respiratory effort. CV: Nondisplaced PMI.  Heart regular S1/S2, no S3/S4, no murmur.  No peripheral edema.  No carotid bruit.  Normal pedal pulses.  Abdomen: Soft, nontender, no hepatosplenomegaly, no distention.  Skin: Intact without lesions or rashes.  Neurologic: Alert and oriented x 3.  Psych: Normal affect. Extremities: No clubbing or cyanosis.  HEENT: Normal.   Assessment/Plan: 1. HTN: Possible hypertensive cardiomyopathy and hypertensive nephropathy.   - BP controlled with Coreg.  2. ESRD: Getting PD, working up for renal transplant at [facility].  3. Chronic systolic CHF: Echo in 9/72 with EF 25-30%. Nonischemic cardiomyopathy based on cath in 6/18.  Cause of cardiomyopathy may be long-standing, poorly controlled HTN.  NYHA class II symptoms.  He improved symptomatically s/p MV repair.  Echo in 4/19 with EF 40-45%. Echo at [facility] in 2/21 showed EF 40-45%.   TEE in 3/21 with EF 45%.  NYHA class II. CPX in 11/21 with peak VO2 17.7 (51%  predicted), RER 1.12, VE/VCO2 slope 29.  Moderate functional limitation due to mixed restrictive/obstructive lung disease.  - Abnormal CPX seems to suggest lung pathology rather than cardiac, I am going to refer to pulmonary for a full evaluation.  He has a history of asthma.  - Continue Coreg 12.5 mg bid. - Unable to add additional cardiac meds due to fall in BP with HD.  4. Mitral regurgitation: s/p MV repair for severe MR.  Much improved symptomatically s/p surgery. Echo at [facility] in 2/21 suggested "severe" mitral stenosis with mean gradient 7 mmHg and eccentric MR, ?mild.  However, TEE here in 3/21 showed stable MV repair with mild MR and no significant mitral stenosis.  - Will need abx with dental work given prosthetic material.  5. Asthma: Occasional wheezing, not exertional.  Abnormal CPX as above suggesting lung pathology.  I have referred him to pulmonary.  6. CVA: Prior CVA by head imaging.   7. Hyperlipidemia: Check lipids today.   Followup in 6 months.   Signed, Loralie Champagne, MD  12/04/2019  [facility] [address] [facility] [place] [address] [phone] (office) [phone] (fax)

## 2019-12-11 NOTE — Telephone Encounter (Signed)
lmtcb for pt.  Dr. Elsworth Soho has an opening on 12.15.21 at 0930 for a consult appt.

## 2019-12-17 ENCOUNTER — Ambulatory Visit (INDEPENDENT_AMBULATORY_CARE_PROVIDER_SITE_OTHER): Payer: Medicare Other | Admitting: Pulmonary Disease

## 2019-12-17 ENCOUNTER — Ambulatory Visit (INDEPENDENT_AMBULATORY_CARE_PROVIDER_SITE_OTHER): Payer: Medicare Other

## 2019-12-17 ENCOUNTER — Encounter: Payer: Self-pay | Admitting: Pulmonary Disease

## 2019-12-17 ENCOUNTER — Other Ambulatory Visit: Payer: Self-pay

## 2019-12-17 VITALS — BP 108/76 | HR 88 | Temp 97.7°F | Ht 70.0 in | Wt 184.8 lb

## 2019-12-17 DIAGNOSIS — R0600 Dyspnea, unspecified: Secondary | ICD-10-CM

## 2019-12-17 DIAGNOSIS — R0609 Other forms of dyspnea: Secondary | ICD-10-CM

## 2019-12-17 NOTE — Progress Notes (Signed)
Subjective:    Patient ID: Vernon Blackburn, male    DOB: 08/03/1965, 54 y.o.   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  He played college football.  He was maintained on medication such as Advair and Ventolin inhaler for about 20 years until he underwent mitral valve repair in 2018 for severe MR and this improved his dyspnea to the point that he did not need these inhalers at all.  I note spirometry prior to surgery in 2018 which shows mild airway obstruction without significant bronchodilator response.  Unfortunately he has developed ESRD, started hemodialysis 02/2019 and transition to peritoneal dialysis for convenience in 08/2019.  He is being evaluated for renal transplant and his 40 year old son is going to be a living donor. He underwent cardiac evaluation and cardiopulmonary stress test as a part of this evaluation.  Unfortunately on the day of test 11/16 he developed congestion with wheezing and cough and brown sputum.  Covid testing was negative.  Spirometry again showed mild airway obstruction, MVV is 49% suggesting no pulmonary limitation to exercise but VO2 max was only 50%, interpretation was moderate functional limitation due to mixed restrictive/obstructive lung disease  He was initially treated with Levaquin and due to concern about tendon injuries, changed to cefdinir and a course of prednisone.  His respiratory symptoms are 90% improved and he feels back to baseline.  He admits to decreased exercise tolerance ever since starting dialysis but he is starting a walking program again.  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  Moderate functional limitation due to mixed restrictive/obstructive lung disease MVV 49 % Spirometry-ratio 63, FEV1 72%, FVC 91%  PFTs 06/2016 -mild airway obstruction, ratio 68, FEV1 2.36/70%, improved to 77% but not significant bronchodilator response, FVC 82%, TLC normal, DLCO 57%  CT angio chest 07/2016 neg VQ 07/2016 intermed prob   Past Medical History:  Diagnosis Date  . ADHD   . Allergy   . Anemia, chronic disease   . Arthritis    Gout  . Asthma   . Cardiomyopathy [facility])    cardiologist is [employer]? [name], lov in office per patient was last year in 2019   . CHF (congestive heart failure) ([place])    a. 01/2016: echo showing EF of 20-25%, no WMA, severe MR, and PA Peak Pressure of 52 mm Hg.   Marland Kitchen Chronic systolic HF (heart failure) ([place])   . CKD (chronic kidney disease)    Dr [name] ,  stage 4 ; per patient lov  in office march 2020 , per patient at this last visit he was not approved as a candidate for kidney transplant because his kidney fx has improved to greater  than 20  . Elbow pain, left   . Heart murmur   . History of kidney stones   . Hypertension   . Knee pain, bilateral   . Left ankle pain 11/04/2010   Gout  . Leg pain, bilateral   . Peripheral edema    Bilateral lower extremity   .  Pneumonia    hx  . Pulmonary HTN ([place])   . S/P minimally invasive mitral valve repair 06/29/2016   30 mm [name] via right mini thoracotomy approach  . Severe mitral regurgitation   . Sleep apnea    hx bariatric  surgery 3 yrs ago lost 166 lbs  . Syncope and collapse 07/14/2016  . Venous insufficiency    Past Surgical History:  Procedure Laterality Date  . AV FISTULA PLACEMENT Left 03/10/2019   Procedure: LEFT ARM BASILIC ARTERIOVENOUS (AV) FISTULA CREATION FIRST STAGE;  Surgeon: Marty Heck, MD;  Location: [place];  Service:  Vascular;  Laterality: Left;  . BASCILIC VEIN TRANSPOSITION Left 05/01/2019   Procedure: LEFT SECOND STAGE BASCILIC VEIN TRANSPOSITION.;  Surgeon: Marty Heck, MD;  Location: [place];  Service: Vascular;  Laterality: Left;  . CYSTOSCOPY/URETEROSCOPY/HOLMIUM LASER/STENT PLACEMENT Right 07/17/2018   Procedure: CYSTOSCOPY/RETROGRADE/URETEROSCOPY/HOLMIUM LASER/STENT PLACEMENT/ CYSTOLITHALOPAXY;  Surgeon: Ceasar Mons, MD;  Location: [facility];  Service: Urology;  Laterality: Right;  . GASTRIC BYPASS  2014  . HERNIA REPAIR    . MENISCUS REPAIR Right 05/2008   right  knee  . MITRAL VALVE REPAIR Right 06/29/2016   Procedure: MINIMALLY INVASIVE MITRAL VALVE REPAIR  (MVR);  Surgeon: [name], MD;  Location: [place];  Service: Open Heart Surgery;  Laterality: Right;  . RIGHT/LEFT HEART CATH AND CORONARY ANGIOGRAPHY N/A 06/07/2016   Procedure: Right/Left Heart Cath and Coronary Angiography;  Surgeon: Larey Dresser, MD;  Location: [place] [facility];  Service: Cardiovascular;  Laterality: N/A;  . TEE WITHOUT CARDIOVERSION N/A 05/04/2016   Procedure: TRANSESOPHAGEAL ECHOCARDIOGRAM (TEE);  Surgeon: Larey Dresser, MD;  Location[facility];  Service: Cardiovascular;  Laterality: N/A;  . TEE WITHOUT CARDIOVERSION N/A 06/29/2016   Procedure: TRANSESOPHAGEAL ECHOCARDIOGRAM (TEE);  Surgeon: [name], MD;  Location: [place];  Service: Open Heart Surgery;  Laterality: N/A;  . TEE WITHOUT CARDIOVERSION N/A 03/06/2019   Procedure: TRANSESOPHAGEAL ECHOCARDIOGRAM (TEE);  Surgeon: Larey Dresser, MD;  Location: [facility] ENDOSCOPY;  Service: Cardiovascular;  Laterality: N/A;    Allergies  Allergen Reactions  . Isosorbide     Hypotension     Social History   Socioeconomic History  . Marital status: Married    Spouse name: Not on file  . Number of children: Not on file  . Years of education: Not on file  . Highest education level: Not on file  Occupational History  . Not on file   Tobacco Use  . Smoking status: Never Smoker  . Smokeless tobacco: Never Used  Vaping Use  . Vaping Use: Never used  Substance and Sexual Activity  . Alcohol use: Not Currently  . Drug use: No  . Sexual activity: Not on file  Other Topics Concern  . Not on file  Social History Narrative  . Not on file   Social Determinants of Health   Financial Resource Strain: Not on file  Food Insecurity: Not on file  Transportation Needs: Not on file  Physical Activity: Not on file  Stress: Not on file  Social Connections: Not on file  Intimate Partner Violence: Not on file     Family History  Problem Relation Age of Onset  . Diabetes Mother   . Hypertension Mother   . Heart failure Mother   . Colon cancer Neg Hx   . Esophageal cancer Neg Hx   . Rectal cancer Neg Hx   . Stomach cancer Neg Hx  Review of Systems Dry cough Chest congestion, wheezing has resolved   Constitutional: negative for anorexia, fevers and sweats  Eyes: negative for irritation, redness and visual disturbance  Ears, nose, mouth, throat, and face: negative for earaches, epistaxis, nasal congestion and sore throat  Respiratory: negative for dyspnea on exertion, sputum and wheezing  Cardiovascular: negative for chest pain, dyspnea, lower extremity edema, orthopnea, palpitations and syncope  Gastrointestinal: negative for abdominal pain, constipation, diarrhea, melena, nausea and vomiting  Genitourinary:negative for dysuria, frequency and hematuria  Hematologic/lymphatic: negative for bleeding, easy bruising and lymphadenopathy  Musculoskeletal:negative for arthralgias, muscle weakness and stiff joints  Neurological: negative for coordination problems, gait problems, headaches and weakness  Endocrine: negative for diabetic symptoms including polydipsia, polyuria and weight loss      Objective:   Physical Exam  Gen. Pleasant, well-nourished, in no distress, normal affect ENT - no pallor,icterus,  no post nasal drip Neck: No JVD, no thyromegaly, no carotid bruits Lungs: no use of accessory muscles, no dullness to percussion, clear without rales or rhonchi  Cardiovascular: Rhythm regular, heart sounds  normal, no murmurs or gallops, no peripheral edema Abdomen: soft and non-tender, no hepatosplenomegaly, BS normal. Musculoskeletal: No deformities, no cyanosis or clubbing Neuro:  alert, non focal       Assessment & Plan:

## 2019-12-17 NOTE — Assessment & Plan Note (Signed)
Previously diagnosed as asthma as a young adult but the symptoms resolved after mitral valve repair suggesting that they were of cardiac etiology rather than "true asthma".  His spirometry during cardiopulmonary stress test again shows mild airway obstruction but this may be related to episode of acute bronchitis which is since resolved. We would repeat spirometry pre and post, I would give him at least 8 weeks since this event so we will plan to repeat this in mid January to see if her obstruction resolves.  It is possible that mild airway obstruction may be persistent even post mitral valve repair. His echo does not show significant pulmonary hypertension although shows mild RV dysfunction.  His fluid status seems to be well controlled with peritoneal dialysis currently. We will obtain a chest x-ray to clarify.  Based on about testing we should able to clear him for renal transplant surgery.  Prior imaging does not show any evidence of ILD

## 2019-12-17 NOTE — Patient Instructions (Signed)
Chest x-ray today. We will schedule PFTs for mid January -and clear you for transplant after reviewing Meanwhile, use albuterol only as needed for wheezing

## 2019-12-19 ENCOUNTER — Telehealth: Payer: Self-pay | Admitting: Pulmonary Disease

## 2019-12-19 NOTE — Telephone Encounter (Signed)
Pt seen on 12.15.21 by Dr. Elsworth Soho. Will close encounter.

## 2019-12-19 NOTE — Progress Notes (Signed)
Called and left message on voicemail to please return phone call to go over results. Contact number provided. 

## 2019-12-19 NOTE — Telephone Encounter (Signed)
Rigoberto Noel, MD  12/18/2019 8:41 AM EST      Clear except for mild scarring at rt lung base   Spoke with patient. He verbalized understanding of results. He stated that he received a call from [facility] Transplant Department this morning and was told that he was no longer a candidate for a kidney transplant. He believes [facility] received the office notes from RA yesterday and wonders if this played a role on their decision. He wanted to schedule a televisit to discuss with RA but wants to take the weekend to think about it. He will call back next to schedule another appt.

## 2019-12-23 ENCOUNTER — Institutional Professional Consult (permissible substitution): Payer: Medicare Other | Admitting: Pulmonary Disease

## 2019-12-23 ENCOUNTER — Telehealth: Payer: Self-pay | Admitting: Pulmonary Disease

## 2019-12-23 ENCOUNTER — Telehealth (HOSPITAL_COMMUNITY): Payer: Self-pay

## 2019-12-23 NOTE — Telephone Encounter (Signed)
Spoke with the pt  He states to let Dr Elsworth Soho know that he was turned down for kidney transplant after [facility] reviewed our notes  I advised that they may be waiting on the PFT- currently scheduled for 01/14/20- I moved this up to 12/31/19 for him  Pt wants Dr Elsworth Soho to be aware that Dr Lissa Merlin should be contacting him to discuss his case

## 2019-12-23 NOTE — Telephone Encounter (Signed)
Pt calling to let Dr Elsworth Soho know that Dr Lissa Merlin at [facility] will be calling him to get feedback on pt. Advised him that [name] is out of the week this week but will be back next week

## 2019-12-24 NOTE — Telephone Encounter (Signed)
I have called & left message for Aaron Edelman lane, transplant RN or his transplant MD to call me to discuss

## 2019-12-29 ENCOUNTER — Other Ambulatory Visit (HOSPITAL_COMMUNITY)
Admission: RE | Admit: 2019-12-29 | Discharge: 2019-12-29 | Disposition: A | Discharge status: Home / Self Care | Payer: Medicare Other | Source: Ambulatory Visit | Attending: Pulmonary Disease | Admitting: Pulmonary Disease

## 2019-12-29 DIAGNOSIS — Z01812 Encounter for preprocedural laboratory examination: Secondary | ICD-10-CM | POA: Insufficient documentation

## 2019-12-29 DIAGNOSIS — Z20822 Contact with and (suspected) exposure to covid-19: Secondary | ICD-10-CM | POA: Insufficient documentation

## 2019-12-29 LAB — SARS CORONAVIRUS 2 (TAT 6-24 HRS): SARS Coronavirus 2: NEGATIVE

## 2019-12-31 ENCOUNTER — Other Ambulatory Visit: Payer: Self-pay

## 2019-12-31 ENCOUNTER — Ambulatory Visit (INDEPENDENT_AMBULATORY_CARE_PROVIDER_SITE_OTHER): Payer: Medicare Other | Admitting: Pulmonary Disease

## 2019-12-31 DIAGNOSIS — R0600 Dyspnea, unspecified: Secondary | ICD-10-CM

## 2019-12-31 DIAGNOSIS — R0609 Other forms of dyspnea: Secondary | ICD-10-CM

## 2019-12-31 LAB — PULMONARY FUNCTION TEST
DL/VA % pred: 84 %
DL/VA: 3.69 ml/min/mmHg/L
DLCO cor % pred: 72 %
DLCO cor: 20.9 ml/min/mmHg
DLCO unc % pred: 72 %
DLCO unc: 20.9 ml/min/mmHg
FEF 25-75 Post: 2.28 L/sec
FEF 25-75 Pre: 1.92 L/sec
FEF2575-%Change-Post: 18 %
FEF2575-%Pred-Post: 70 %
FEF2575-%Pred-Pre: 59 %
FEV1-%Change-Post: 3 %
FEV1-%Pred-Post: 89 %
FEV1-%Pred-Pre: 86 %
FEV1-Post: 2.93 L
FEV1-Pre: 2.83 L
FEV1FVC-%Change-Post: 1 %
FEV1FVC-%Pred-Pre: 89 %
FEV6-%Change-Post: 2 %
FEV6-%Pred-Post: 100 %
FEV6-%Pred-Pre: 98 %
FEV6-Post: 4.04 L
FEV6-Pre: 3.95 L
FEV6FVC-%Change-Post: 0 %
FEV6FVC-%Pred-Post: 102 %
FEV6FVC-%Pred-Pre: 102 %
FVC-%Change-Post: 2 %
FVC-%Pred-Post: 98 %
FVC-%Pred-Pre: 96 %
FVC-Post: 4.07 L
FVC-Pre: 3.99 L
Post FEV1/FVC ratio: 72 %
Post FEV6/FVC ratio: 99 %
Pre FEV1/FVC ratio: 71 %
Pre FEV6/FVC Ratio: 99 %
RV % pred: 111 %
RV: 2.33 L
TLC % pred: 88 %
TLC: 6.2 L

## 2019-12-31 NOTE — Progress Notes (Signed)
PFT done today. 

## 2020-01-03 DIAGNOSIS — R82998 Other abnormal findings in urine: Secondary | ICD-10-CM | POA: Diagnosis not present

## 2020-01-03 DIAGNOSIS — N2581 Secondary hyperparathyroidism of renal origin: Secondary | ICD-10-CM | POA: Diagnosis not present

## 2020-01-03 DIAGNOSIS — Z4932 Encounter for adequacy testing for peritoneal dialysis: Secondary | ICD-10-CM | POA: Diagnosis not present

## 2020-01-03 DIAGNOSIS — I129 Hypertensive chronic kidney disease with stage 1 through stage 4 chronic kidney disease, or unspecified chronic kidney disease: Secondary | ICD-10-CM | POA: Diagnosis not present

## 2020-01-03 DIAGNOSIS — D513 Other dietary vitamin B12 deficiency anemia: Secondary | ICD-10-CM | POA: Diagnosis not present

## 2020-01-03 DIAGNOSIS — Z992 Dependence on renal dialysis: Secondary | ICD-10-CM | POA: Diagnosis not present

## 2020-01-03 DIAGNOSIS — N186 End stage renal disease: Secondary | ICD-10-CM | POA: Diagnosis not present

## 2020-01-03 DIAGNOSIS — D509 Iron deficiency anemia, unspecified: Secondary | ICD-10-CM | POA: Diagnosis not present

## 2020-01-03 DIAGNOSIS — N2589 Other disorders resulting from impaired renal tubular function: Secondary | ICD-10-CM | POA: Diagnosis not present

## 2020-01-03 DIAGNOSIS — R7309 Other abnormal glucose: Secondary | ICD-10-CM | POA: Diagnosis not present

## 2020-01-03 DIAGNOSIS — K769 Liver disease, unspecified: Secondary | ICD-10-CM | POA: Diagnosis not present

## 2020-01-03 DIAGNOSIS — D631 Anemia in chronic kidney disease: Secondary | ICD-10-CM | POA: Diagnosis not present

## 2020-01-04 DIAGNOSIS — N186 End stage renal disease: Secondary | ICD-10-CM | POA: Diagnosis not present

## 2020-01-04 DIAGNOSIS — K769 Liver disease, unspecified: Secondary | ICD-10-CM | POA: Diagnosis not present

## 2020-01-04 DIAGNOSIS — D513 Other dietary vitamin B12 deficiency anemia: Secondary | ICD-10-CM | POA: Diagnosis not present

## 2020-01-04 DIAGNOSIS — D631 Anemia in chronic kidney disease: Secondary | ICD-10-CM | POA: Diagnosis not present

## 2020-01-04 DIAGNOSIS — N2589 Other disorders resulting from impaired renal tubular function: Secondary | ICD-10-CM | POA: Diagnosis not present

## 2020-01-04 DIAGNOSIS — D509 Iron deficiency anemia, unspecified: Secondary | ICD-10-CM | POA: Diagnosis not present

## 2020-01-04 DIAGNOSIS — R82998 Other abnormal findings in urine: Secondary | ICD-10-CM | POA: Diagnosis not present

## 2020-01-04 DIAGNOSIS — Z992 Dependence on renal dialysis: Secondary | ICD-10-CM | POA: Diagnosis not present

## 2020-01-04 DIAGNOSIS — N2581 Secondary hyperparathyroidism of renal origin: Secondary | ICD-10-CM | POA: Diagnosis not present

## 2020-01-04 DIAGNOSIS — R7309 Other abnormal glucose: Secondary | ICD-10-CM | POA: Diagnosis not present

## 2020-01-04 DIAGNOSIS — Z4932 Encounter for adequacy testing for peritoneal dialysis: Secondary | ICD-10-CM | POA: Diagnosis not present

## 2020-01-05 DIAGNOSIS — Z4932 Encounter for adequacy testing for peritoneal dialysis: Secondary | ICD-10-CM | POA: Diagnosis not present

## 2020-01-05 DIAGNOSIS — Z992 Dependence on renal dialysis: Secondary | ICD-10-CM | POA: Diagnosis not present

## 2020-01-05 DIAGNOSIS — N186 End stage renal disease: Secondary | ICD-10-CM | POA: Diagnosis not present

## 2020-01-05 DIAGNOSIS — D631 Anemia in chronic kidney disease: Secondary | ICD-10-CM | POA: Diagnosis not present

## 2020-01-05 DIAGNOSIS — D513 Other dietary vitamin B12 deficiency anemia: Secondary | ICD-10-CM | POA: Diagnosis not present

## 2020-01-05 DIAGNOSIS — N2581 Secondary hyperparathyroidism of renal origin: Secondary | ICD-10-CM | POA: Diagnosis not present

## 2020-01-05 DIAGNOSIS — N2589 Other disorders resulting from impaired renal tubular function: Secondary | ICD-10-CM | POA: Diagnosis not present

## 2020-01-05 DIAGNOSIS — R82998 Other abnormal findings in urine: Secondary | ICD-10-CM | POA: Diagnosis not present

## 2020-01-05 DIAGNOSIS — R7309 Other abnormal glucose: Secondary | ICD-10-CM | POA: Diagnosis not present

## 2020-01-05 DIAGNOSIS — D509 Iron deficiency anemia, unspecified: Secondary | ICD-10-CM | POA: Diagnosis not present

## 2020-01-05 DIAGNOSIS — K769 Liver disease, unspecified: Secondary | ICD-10-CM | POA: Diagnosis not present

## 2020-01-06 DIAGNOSIS — K769 Liver disease, unspecified: Secondary | ICD-10-CM | POA: Diagnosis not present

## 2020-01-06 DIAGNOSIS — N2589 Other disorders resulting from impaired renal tubular function: Secondary | ICD-10-CM | POA: Diagnosis not present

## 2020-01-06 DIAGNOSIS — D509 Iron deficiency anemia, unspecified: Secondary | ICD-10-CM | POA: Diagnosis not present

## 2020-01-06 DIAGNOSIS — H1013 Acute atopic conjunctivitis, bilateral: Secondary | ICD-10-CM | POA: Diagnosis not present

## 2020-01-06 DIAGNOSIS — N186 End stage renal disease: Secondary | ICD-10-CM | POA: Diagnosis not present

## 2020-01-06 DIAGNOSIS — D631 Anemia in chronic kidney disease: Secondary | ICD-10-CM | POA: Diagnosis not present

## 2020-01-06 DIAGNOSIS — R7309 Other abnormal glucose: Secondary | ICD-10-CM | POA: Diagnosis not present

## 2020-01-06 DIAGNOSIS — D513 Other dietary vitamin B12 deficiency anemia: Secondary | ICD-10-CM | POA: Diagnosis not present

## 2020-01-06 DIAGNOSIS — Z992 Dependence on renal dialysis: Secondary | ICD-10-CM | POA: Diagnosis not present

## 2020-01-06 DIAGNOSIS — R82998 Other abnormal findings in urine: Secondary | ICD-10-CM | POA: Diagnosis not present

## 2020-01-06 DIAGNOSIS — N2581 Secondary hyperparathyroidism of renal origin: Secondary | ICD-10-CM | POA: Diagnosis not present

## 2020-01-06 DIAGNOSIS — Z4932 Encounter for adequacy testing for peritoneal dialysis: Secondary | ICD-10-CM | POA: Diagnosis not present

## 2020-01-07 DIAGNOSIS — N2581 Secondary hyperparathyroidism of renal origin: Secondary | ICD-10-CM | POA: Diagnosis not present

## 2020-01-07 DIAGNOSIS — N186 End stage renal disease: Secondary | ICD-10-CM | POA: Diagnosis not present

## 2020-01-07 DIAGNOSIS — N2589 Other disorders resulting from impaired renal tubular function: Secondary | ICD-10-CM | POA: Diagnosis not present

## 2020-01-07 DIAGNOSIS — R7309 Other abnormal glucose: Secondary | ICD-10-CM | POA: Diagnosis not present

## 2020-01-07 DIAGNOSIS — R82998 Other abnormal findings in urine: Secondary | ICD-10-CM | POA: Diagnosis not present

## 2020-01-07 DIAGNOSIS — D631 Anemia in chronic kidney disease: Secondary | ICD-10-CM | POA: Diagnosis not present

## 2020-01-07 DIAGNOSIS — K769 Liver disease, unspecified: Secondary | ICD-10-CM | POA: Diagnosis not present

## 2020-01-07 DIAGNOSIS — D509 Iron deficiency anemia, unspecified: Secondary | ICD-10-CM | POA: Diagnosis not present

## 2020-01-07 DIAGNOSIS — D513 Other dietary vitamin B12 deficiency anemia: Secondary | ICD-10-CM | POA: Diagnosis not present

## 2020-01-07 DIAGNOSIS — Z992 Dependence on renal dialysis: Secondary | ICD-10-CM | POA: Diagnosis not present

## 2020-01-07 DIAGNOSIS — Z4932 Encounter for adequacy testing for peritoneal dialysis: Secondary | ICD-10-CM | POA: Diagnosis not present

## 2020-01-07 NOTE — Progress Notes (Signed)
Called and went over PFT results per Dr Elsworth Soho with patient. All questions answered and patient expressed full understanding. Called and spoke with and sent copy of result note and pft results faxed to transplant coordinator, Domingo Pulse at  Fax#: [phone] per Dr Elsworth Soho. Nothing further needed at this time.

## 2020-01-08 DIAGNOSIS — N2589 Other disorders resulting from impaired renal tubular function: Secondary | ICD-10-CM | POA: Diagnosis not present

## 2020-01-08 DIAGNOSIS — Z4932 Encounter for adequacy testing for peritoneal dialysis: Secondary | ICD-10-CM | POA: Diagnosis not present

## 2020-01-08 DIAGNOSIS — N186 End stage renal disease: Secondary | ICD-10-CM | POA: Diagnosis not present

## 2020-01-08 DIAGNOSIS — R82998 Other abnormal findings in urine: Secondary | ICD-10-CM | POA: Diagnosis not present

## 2020-01-08 DIAGNOSIS — N2581 Secondary hyperparathyroidism of renal origin: Secondary | ICD-10-CM | POA: Diagnosis not present

## 2020-01-08 DIAGNOSIS — D631 Anemia in chronic kidney disease: Secondary | ICD-10-CM | POA: Diagnosis not present

## 2020-01-08 DIAGNOSIS — D509 Iron deficiency anemia, unspecified: Secondary | ICD-10-CM | POA: Diagnosis not present

## 2020-01-08 DIAGNOSIS — Z992 Dependence on renal dialysis: Secondary | ICD-10-CM | POA: Diagnosis not present

## 2020-01-08 DIAGNOSIS — R7309 Other abnormal glucose: Secondary | ICD-10-CM | POA: Diagnosis not present

## 2020-01-08 DIAGNOSIS — K769 Liver disease, unspecified: Secondary | ICD-10-CM | POA: Diagnosis not present

## 2020-01-08 DIAGNOSIS — D513 Other dietary vitamin B12 deficiency anemia: Secondary | ICD-10-CM | POA: Diagnosis not present

## 2020-01-09 DIAGNOSIS — N186 End stage renal disease: Secondary | ICD-10-CM | POA: Diagnosis not present

## 2020-01-09 DIAGNOSIS — R7309 Other abnormal glucose: Secondary | ICD-10-CM | POA: Diagnosis not present

## 2020-01-09 DIAGNOSIS — R82998 Other abnormal findings in urine: Secondary | ICD-10-CM | POA: Diagnosis not present

## 2020-01-09 DIAGNOSIS — N2581 Secondary hyperparathyroidism of renal origin: Secondary | ICD-10-CM | POA: Diagnosis not present

## 2020-01-09 DIAGNOSIS — N2589 Other disorders resulting from impaired renal tubular function: Secondary | ICD-10-CM | POA: Diagnosis not present

## 2020-01-09 DIAGNOSIS — K769 Liver disease, unspecified: Secondary | ICD-10-CM | POA: Diagnosis not present

## 2020-01-09 DIAGNOSIS — D631 Anemia in chronic kidney disease: Secondary | ICD-10-CM | POA: Diagnosis not present

## 2020-01-09 DIAGNOSIS — D509 Iron deficiency anemia, unspecified: Secondary | ICD-10-CM | POA: Diagnosis not present

## 2020-01-09 DIAGNOSIS — Z4932 Encounter for adequacy testing for peritoneal dialysis: Secondary | ICD-10-CM | POA: Diagnosis not present

## 2020-01-09 DIAGNOSIS — Z992 Dependence on renal dialysis: Secondary | ICD-10-CM | POA: Diagnosis not present

## 2020-01-09 DIAGNOSIS — D513 Other dietary vitamin B12 deficiency anemia: Secondary | ICD-10-CM | POA: Diagnosis not present

## 2020-01-10 DIAGNOSIS — R82998 Other abnormal findings in urine: Secondary | ICD-10-CM | POA: Diagnosis not present

## 2020-01-10 DIAGNOSIS — N2589 Other disorders resulting from impaired renal tubular function: Secondary | ICD-10-CM | POA: Diagnosis not present

## 2020-01-10 DIAGNOSIS — Z992 Dependence on renal dialysis: Secondary | ICD-10-CM | POA: Diagnosis not present

## 2020-01-10 DIAGNOSIS — D631 Anemia in chronic kidney disease: Secondary | ICD-10-CM | POA: Diagnosis not present

## 2020-01-10 DIAGNOSIS — D513 Other dietary vitamin B12 deficiency anemia: Secondary | ICD-10-CM | POA: Diagnosis not present

## 2020-01-10 DIAGNOSIS — K769 Liver disease, unspecified: Secondary | ICD-10-CM | POA: Diagnosis not present

## 2020-01-10 DIAGNOSIS — R7309 Other abnormal glucose: Secondary | ICD-10-CM | POA: Diagnosis not present

## 2020-01-10 DIAGNOSIS — D509 Iron deficiency anemia, unspecified: Secondary | ICD-10-CM | POA: Diagnosis not present

## 2020-01-10 DIAGNOSIS — N186 End stage renal disease: Secondary | ICD-10-CM | POA: Diagnosis not present

## 2020-01-10 DIAGNOSIS — Z4932 Encounter for adequacy testing for peritoneal dialysis: Secondary | ICD-10-CM | POA: Diagnosis not present

## 2020-01-10 DIAGNOSIS — N2581 Secondary hyperparathyroidism of renal origin: Secondary | ICD-10-CM | POA: Diagnosis not present

## 2020-01-11 DIAGNOSIS — N2581 Secondary hyperparathyroidism of renal origin: Secondary | ICD-10-CM | POA: Diagnosis not present

## 2020-01-11 DIAGNOSIS — N2589 Other disorders resulting from impaired renal tubular function: Secondary | ICD-10-CM | POA: Diagnosis not present

## 2020-01-11 DIAGNOSIS — D509 Iron deficiency anemia, unspecified: Secondary | ICD-10-CM | POA: Diagnosis not present

## 2020-01-11 DIAGNOSIS — D513 Other dietary vitamin B12 deficiency anemia: Secondary | ICD-10-CM | POA: Diagnosis not present

## 2020-01-11 DIAGNOSIS — N186 End stage renal disease: Secondary | ICD-10-CM | POA: Diagnosis not present

## 2020-01-11 DIAGNOSIS — Z992 Dependence on renal dialysis: Secondary | ICD-10-CM | POA: Diagnosis not present

## 2020-01-11 DIAGNOSIS — K769 Liver disease, unspecified: Secondary | ICD-10-CM | POA: Diagnosis not present

## 2020-01-11 DIAGNOSIS — Z4932 Encounter for adequacy testing for peritoneal dialysis: Secondary | ICD-10-CM | POA: Diagnosis not present

## 2020-01-11 DIAGNOSIS — D631 Anemia in chronic kidney disease: Secondary | ICD-10-CM | POA: Diagnosis not present

## 2020-01-11 DIAGNOSIS — R82998 Other abnormal findings in urine: Secondary | ICD-10-CM | POA: Diagnosis not present

## 2020-01-11 DIAGNOSIS — R7309 Other abnormal glucose: Secondary | ICD-10-CM | POA: Diagnosis not present

## 2020-01-12 DIAGNOSIS — Z992 Dependence on renal dialysis: Secondary | ICD-10-CM | POA: Diagnosis not present

## 2020-01-12 DIAGNOSIS — N2589 Other disorders resulting from impaired renal tubular function: Secondary | ICD-10-CM | POA: Diagnosis not present

## 2020-01-12 DIAGNOSIS — R7309 Other abnormal glucose: Secondary | ICD-10-CM | POA: Diagnosis not present

## 2020-01-12 DIAGNOSIS — D631 Anemia in chronic kidney disease: Secondary | ICD-10-CM | POA: Diagnosis not present

## 2020-01-12 DIAGNOSIS — R82998 Other abnormal findings in urine: Secondary | ICD-10-CM | POA: Diagnosis not present

## 2020-01-12 DIAGNOSIS — D513 Other dietary vitamin B12 deficiency anemia: Secondary | ICD-10-CM | POA: Diagnosis not present

## 2020-01-12 DIAGNOSIS — N186 End stage renal disease: Secondary | ICD-10-CM | POA: Diagnosis not present

## 2020-01-12 DIAGNOSIS — Z4932 Encounter for adequacy testing for peritoneal dialysis: Secondary | ICD-10-CM | POA: Diagnosis not present

## 2020-01-12 DIAGNOSIS — K769 Liver disease, unspecified: Secondary | ICD-10-CM | POA: Diagnosis not present

## 2020-01-12 DIAGNOSIS — D509 Iron deficiency anemia, unspecified: Secondary | ICD-10-CM | POA: Diagnosis not present

## 2020-01-12 DIAGNOSIS — N2581 Secondary hyperparathyroidism of renal origin: Secondary | ICD-10-CM | POA: Diagnosis not present

## 2020-01-13 DIAGNOSIS — D631 Anemia in chronic kidney disease: Secondary | ICD-10-CM | POA: Diagnosis not present

## 2020-01-13 DIAGNOSIS — K769 Liver disease, unspecified: Secondary | ICD-10-CM | POA: Diagnosis not present

## 2020-01-13 DIAGNOSIS — Z4932 Encounter for adequacy testing for peritoneal dialysis: Secondary | ICD-10-CM | POA: Diagnosis not present

## 2020-01-13 DIAGNOSIS — D513 Other dietary vitamin B12 deficiency anemia: Secondary | ICD-10-CM | POA: Diagnosis not present

## 2020-01-13 DIAGNOSIS — R7309 Other abnormal glucose: Secondary | ICD-10-CM | POA: Diagnosis not present

## 2020-01-13 DIAGNOSIS — N2581 Secondary hyperparathyroidism of renal origin: Secondary | ICD-10-CM | POA: Diagnosis not present

## 2020-01-13 DIAGNOSIS — Z992 Dependence on renal dialysis: Secondary | ICD-10-CM | POA: Diagnosis not present

## 2020-01-13 DIAGNOSIS — N2589 Other disorders resulting from impaired renal tubular function: Secondary | ICD-10-CM | POA: Diagnosis not present

## 2020-01-13 DIAGNOSIS — R82998 Other abnormal findings in urine: Secondary | ICD-10-CM | POA: Diagnosis not present

## 2020-01-13 DIAGNOSIS — N186 End stage renal disease: Secondary | ICD-10-CM | POA: Diagnosis not present

## 2020-01-13 DIAGNOSIS — D509 Iron deficiency anemia, unspecified: Secondary | ICD-10-CM | POA: Diagnosis not present

## 2020-01-14 DIAGNOSIS — R82998 Other abnormal findings in urine: Secondary | ICD-10-CM | POA: Diagnosis not present

## 2020-01-14 DIAGNOSIS — D631 Anemia in chronic kidney disease: Secondary | ICD-10-CM | POA: Diagnosis not present

## 2020-01-14 DIAGNOSIS — N186 End stage renal disease: Secondary | ICD-10-CM | POA: Diagnosis not present

## 2020-01-14 DIAGNOSIS — K769 Liver disease, unspecified: Secondary | ICD-10-CM | POA: Diagnosis not present

## 2020-01-14 DIAGNOSIS — D509 Iron deficiency anemia, unspecified: Secondary | ICD-10-CM | POA: Diagnosis not present

## 2020-01-14 DIAGNOSIS — R7309 Other abnormal glucose: Secondary | ICD-10-CM | POA: Diagnosis not present

## 2020-01-14 DIAGNOSIS — D513 Other dietary vitamin B12 deficiency anemia: Secondary | ICD-10-CM | POA: Diagnosis not present

## 2020-01-14 DIAGNOSIS — N2581 Secondary hyperparathyroidism of renal origin: Secondary | ICD-10-CM | POA: Diagnosis not present

## 2020-01-14 DIAGNOSIS — N2589 Other disorders resulting from impaired renal tubular function: Secondary | ICD-10-CM | POA: Diagnosis not present

## 2020-01-14 DIAGNOSIS — Z992 Dependence on renal dialysis: Secondary | ICD-10-CM | POA: Diagnosis not present

## 2020-01-14 DIAGNOSIS — Z4932 Encounter for adequacy testing for peritoneal dialysis: Secondary | ICD-10-CM | POA: Diagnosis not present

## 2020-01-15 DIAGNOSIS — R942 Abnormal results of pulmonary function studies: Secondary | ICD-10-CM | POA: Diagnosis not present

## 2020-01-15 DIAGNOSIS — N2589 Other disorders resulting from impaired renal tubular function: Secondary | ICD-10-CM | POA: Diagnosis not present

## 2020-01-15 DIAGNOSIS — K769 Liver disease, unspecified: Secondary | ICD-10-CM | POA: Diagnosis not present

## 2020-01-15 DIAGNOSIS — R82998 Other abnormal findings in urine: Secondary | ICD-10-CM | POA: Diagnosis not present

## 2020-01-15 DIAGNOSIS — Z992 Dependence on renal dialysis: Secondary | ICD-10-CM | POA: Diagnosis not present

## 2020-01-15 DIAGNOSIS — Z01810 Encounter for preprocedural cardiovascular examination: Secondary | ICD-10-CM | POA: Diagnosis not present

## 2020-01-15 DIAGNOSIS — D513 Other dietary vitamin B12 deficiency anemia: Secondary | ICD-10-CM | POA: Diagnosis not present

## 2020-01-15 DIAGNOSIS — Z4932 Encounter for adequacy testing for peritoneal dialysis: Secondary | ICD-10-CM | POA: Diagnosis not present

## 2020-01-15 DIAGNOSIS — D631 Anemia in chronic kidney disease: Secondary | ICD-10-CM | POA: Diagnosis not present

## 2020-01-15 DIAGNOSIS — I509 Heart failure, unspecified: Secondary | ICD-10-CM | POA: Diagnosis not present

## 2020-01-15 DIAGNOSIS — D509 Iron deficiency anemia, unspecified: Secondary | ICD-10-CM | POA: Diagnosis not present

## 2020-01-15 DIAGNOSIS — J45998 Other asthma: Secondary | ICD-10-CM | POA: Diagnosis not present

## 2020-01-15 DIAGNOSIS — R7309 Other abnormal glucose: Secondary | ICD-10-CM | POA: Diagnosis not present

## 2020-01-15 DIAGNOSIS — N2581 Secondary hyperparathyroidism of renal origin: Secondary | ICD-10-CM | POA: Diagnosis not present

## 2020-01-15 DIAGNOSIS — N186 End stage renal disease: Secondary | ICD-10-CM | POA: Diagnosis not present

## 2020-01-16 DIAGNOSIS — D631 Anemia in chronic kidney disease: Secondary | ICD-10-CM | POA: Diagnosis not present

## 2020-01-16 DIAGNOSIS — D509 Iron deficiency anemia, unspecified: Secondary | ICD-10-CM | POA: Diagnosis not present

## 2020-01-16 DIAGNOSIS — N2589 Other disorders resulting from impaired renal tubular function: Secondary | ICD-10-CM | POA: Diagnosis not present

## 2020-01-16 DIAGNOSIS — R7309 Other abnormal glucose: Secondary | ICD-10-CM | POA: Diagnosis not present

## 2020-01-16 DIAGNOSIS — Z992 Dependence on renal dialysis: Secondary | ICD-10-CM | POA: Diagnosis not present

## 2020-01-16 DIAGNOSIS — N2581 Secondary hyperparathyroidism of renal origin: Secondary | ICD-10-CM | POA: Diagnosis not present

## 2020-01-16 DIAGNOSIS — R82998 Other abnormal findings in urine: Secondary | ICD-10-CM | POA: Diagnosis not present

## 2020-01-16 DIAGNOSIS — K769 Liver disease, unspecified: Secondary | ICD-10-CM | POA: Diagnosis not present

## 2020-01-16 DIAGNOSIS — N186 End stage renal disease: Secondary | ICD-10-CM | POA: Diagnosis not present

## 2020-01-16 DIAGNOSIS — Z4932 Encounter for adequacy testing for peritoneal dialysis: Secondary | ICD-10-CM | POA: Diagnosis not present

## 2020-01-16 DIAGNOSIS — D513 Other dietary vitamin B12 deficiency anemia: Secondary | ICD-10-CM | POA: Diagnosis not present

## 2020-01-17 DIAGNOSIS — R7309 Other abnormal glucose: Secondary | ICD-10-CM | POA: Diagnosis not present

## 2020-01-17 DIAGNOSIS — D509 Iron deficiency anemia, unspecified: Secondary | ICD-10-CM | POA: Diagnosis not present

## 2020-01-17 DIAGNOSIS — K769 Liver disease, unspecified: Secondary | ICD-10-CM | POA: Diagnosis not present

## 2020-01-17 DIAGNOSIS — Z992 Dependence on renal dialysis: Secondary | ICD-10-CM | POA: Diagnosis not present

## 2020-01-17 DIAGNOSIS — N2589 Other disorders resulting from impaired renal tubular function: Secondary | ICD-10-CM | POA: Diagnosis not present

## 2020-01-17 DIAGNOSIS — N186 End stage renal disease: Secondary | ICD-10-CM | POA: Diagnosis not present

## 2020-01-17 DIAGNOSIS — D631 Anemia in chronic kidney disease: Secondary | ICD-10-CM | POA: Diagnosis not present

## 2020-01-17 DIAGNOSIS — Z4932 Encounter for adequacy testing for peritoneal dialysis: Secondary | ICD-10-CM | POA: Diagnosis not present

## 2020-01-17 DIAGNOSIS — N2581 Secondary hyperparathyroidism of renal origin: Secondary | ICD-10-CM | POA: Diagnosis not present

## 2020-01-17 DIAGNOSIS — R82998 Other abnormal findings in urine: Secondary | ICD-10-CM | POA: Diagnosis not present

## 2020-01-17 DIAGNOSIS — D513 Other dietary vitamin B12 deficiency anemia: Secondary | ICD-10-CM | POA: Diagnosis not present

## 2020-01-18 DIAGNOSIS — D509 Iron deficiency anemia, unspecified: Secondary | ICD-10-CM | POA: Diagnosis not present

## 2020-01-18 DIAGNOSIS — D631 Anemia in chronic kidney disease: Secondary | ICD-10-CM | POA: Diagnosis not present

## 2020-01-18 DIAGNOSIS — R82998 Other abnormal findings in urine: Secondary | ICD-10-CM | POA: Diagnosis not present

## 2020-01-18 DIAGNOSIS — K769 Liver disease, unspecified: Secondary | ICD-10-CM | POA: Diagnosis not present

## 2020-01-18 DIAGNOSIS — R7309 Other abnormal glucose: Secondary | ICD-10-CM | POA: Diagnosis not present

## 2020-01-18 DIAGNOSIS — N186 End stage renal disease: Secondary | ICD-10-CM | POA: Diagnosis not present

## 2020-01-18 DIAGNOSIS — N2589 Other disorders resulting from impaired renal tubular function: Secondary | ICD-10-CM | POA: Diagnosis not present

## 2020-01-18 DIAGNOSIS — Z4932 Encounter for adequacy testing for peritoneal dialysis: Secondary | ICD-10-CM | POA: Diagnosis not present

## 2020-01-18 DIAGNOSIS — Z992 Dependence on renal dialysis: Secondary | ICD-10-CM | POA: Diagnosis not present

## 2020-01-18 DIAGNOSIS — N2581 Secondary hyperparathyroidism of renal origin: Secondary | ICD-10-CM | POA: Diagnosis not present

## 2020-01-18 DIAGNOSIS — D513 Other dietary vitamin B12 deficiency anemia: Secondary | ICD-10-CM | POA: Diagnosis not present

## 2020-01-19 DIAGNOSIS — N2589 Other disorders resulting from impaired renal tubular function: Secondary | ICD-10-CM | POA: Diagnosis not present

## 2020-01-19 DIAGNOSIS — Z992 Dependence on renal dialysis: Secondary | ICD-10-CM | POA: Diagnosis not present

## 2020-01-19 DIAGNOSIS — K769 Liver disease, unspecified: Secondary | ICD-10-CM | POA: Diagnosis not present

## 2020-01-19 DIAGNOSIS — D631 Anemia in chronic kidney disease: Secondary | ICD-10-CM | POA: Diagnosis not present

## 2020-01-19 DIAGNOSIS — N186 End stage renal disease: Secondary | ICD-10-CM | POA: Diagnosis not present

## 2020-01-19 DIAGNOSIS — D513 Other dietary vitamin B12 deficiency anemia: Secondary | ICD-10-CM | POA: Diagnosis not present

## 2020-01-19 DIAGNOSIS — N2581 Secondary hyperparathyroidism of renal origin: Secondary | ICD-10-CM | POA: Diagnosis not present

## 2020-01-19 DIAGNOSIS — Z4932 Encounter for adequacy testing for peritoneal dialysis: Secondary | ICD-10-CM | POA: Diagnosis not present

## 2020-01-19 DIAGNOSIS — D509 Iron deficiency anemia, unspecified: Secondary | ICD-10-CM | POA: Diagnosis not present

## 2020-01-19 DIAGNOSIS — R7309 Other abnormal glucose: Secondary | ICD-10-CM | POA: Diagnosis not present

## 2020-01-19 DIAGNOSIS — R82998 Other abnormal findings in urine: Secondary | ICD-10-CM | POA: Diagnosis not present

## 2020-01-20 DIAGNOSIS — R82998 Other abnormal findings in urine: Secondary | ICD-10-CM | POA: Diagnosis not present

## 2020-01-20 DIAGNOSIS — N2589 Other disorders resulting from impaired renal tubular function: Secondary | ICD-10-CM | POA: Diagnosis not present

## 2020-01-20 DIAGNOSIS — D631 Anemia in chronic kidney disease: Secondary | ICD-10-CM | POA: Diagnosis not present

## 2020-01-20 DIAGNOSIS — R7309 Other abnormal glucose: Secondary | ICD-10-CM | POA: Diagnosis not present

## 2020-01-20 DIAGNOSIS — D509 Iron deficiency anemia, unspecified: Secondary | ICD-10-CM | POA: Diagnosis not present

## 2020-01-20 DIAGNOSIS — N186 End stage renal disease: Secondary | ICD-10-CM | POA: Diagnosis not present

## 2020-01-20 DIAGNOSIS — K769 Liver disease, unspecified: Secondary | ICD-10-CM | POA: Diagnosis not present

## 2020-01-20 DIAGNOSIS — Z4932 Encounter for adequacy testing for peritoneal dialysis: Secondary | ICD-10-CM | POA: Diagnosis not present

## 2020-01-20 DIAGNOSIS — N2581 Secondary hyperparathyroidism of renal origin: Secondary | ICD-10-CM | POA: Diagnosis not present

## 2020-01-20 DIAGNOSIS — D513 Other dietary vitamin B12 deficiency anemia: Secondary | ICD-10-CM | POA: Diagnosis not present

## 2020-01-20 DIAGNOSIS — Z992 Dependence on renal dialysis: Secondary | ICD-10-CM | POA: Diagnosis not present

## 2020-01-21 DIAGNOSIS — D513 Other dietary vitamin B12 deficiency anemia: Secondary | ICD-10-CM | POA: Diagnosis not present

## 2020-01-21 DIAGNOSIS — N2581 Secondary hyperparathyroidism of renal origin: Secondary | ICD-10-CM | POA: Diagnosis not present

## 2020-01-21 DIAGNOSIS — Z4932 Encounter for adequacy testing for peritoneal dialysis: Secondary | ICD-10-CM | POA: Diagnosis not present

## 2020-01-21 DIAGNOSIS — N186 End stage renal disease: Secondary | ICD-10-CM | POA: Diagnosis not present

## 2020-01-21 DIAGNOSIS — N2589 Other disorders resulting from impaired renal tubular function: Secondary | ICD-10-CM | POA: Diagnosis not present

## 2020-01-21 DIAGNOSIS — D631 Anemia in chronic kidney disease: Secondary | ICD-10-CM | POA: Diagnosis not present

## 2020-01-21 DIAGNOSIS — R82998 Other abnormal findings in urine: Secondary | ICD-10-CM | POA: Diagnosis not present

## 2020-01-21 DIAGNOSIS — R7309 Other abnormal glucose: Secondary | ICD-10-CM | POA: Diagnosis not present

## 2020-01-21 DIAGNOSIS — K769 Liver disease, unspecified: Secondary | ICD-10-CM | POA: Diagnosis not present

## 2020-01-21 DIAGNOSIS — Z992 Dependence on renal dialysis: Secondary | ICD-10-CM | POA: Diagnosis not present

## 2020-01-21 DIAGNOSIS — D509 Iron deficiency anemia, unspecified: Secondary | ICD-10-CM | POA: Diagnosis not present

## 2020-01-22 ENCOUNTER — Encounter (HOSPITAL_COMMUNITY): Payer: Self-pay

## 2020-01-22 ENCOUNTER — Emergency Department (HOSPITAL_COMMUNITY): Payer: Medicare Other

## 2020-01-22 ENCOUNTER — Other Ambulatory Visit: Payer: Self-pay

## 2020-01-22 ENCOUNTER — Emergency Department (HOSPITAL_COMMUNITY)
Admission: EM | Admit: 2020-01-22 | Discharge: 2020-01-23 | Disposition: A | Discharge status: Home / Self Care | Payer: Medicare Other | Attending: Emergency Medicine | Admitting: Emergency Medicine

## 2020-01-22 DIAGNOSIS — D513 Other dietary vitamin B12 deficiency anemia: Secondary | ICD-10-CM | POA: Diagnosis not present

## 2020-01-22 DIAGNOSIS — I132 Hypertensive heart and chronic kidney disease with heart failure and with stage 5 chronic kidney disease, or end stage renal disease: Secondary | ICD-10-CM | POA: Diagnosis not present

## 2020-01-22 DIAGNOSIS — N2589 Other disorders resulting from impaired renal tubular function: Secondary | ICD-10-CM | POA: Diagnosis not present

## 2020-01-22 DIAGNOSIS — K769 Liver disease, unspecified: Secondary | ICD-10-CM | POA: Diagnosis not present

## 2020-01-22 DIAGNOSIS — Z95828 Presence of other vascular implants and grafts: Secondary | ICD-10-CM | POA: Diagnosis not present

## 2020-01-22 DIAGNOSIS — I509 Heart failure, unspecified: Secondary | ICD-10-CM | POA: Insufficient documentation

## 2020-01-22 DIAGNOSIS — Z79899 Other long term (current) drug therapy: Secondary | ICD-10-CM | POA: Insufficient documentation

## 2020-01-22 DIAGNOSIS — D509 Iron deficiency anemia, unspecified: Secondary | ICD-10-CM | POA: Diagnosis not present

## 2020-01-22 DIAGNOSIS — Z980 Intestinal bypass and anastomosis status: Secondary | ICD-10-CM | POA: Diagnosis not present

## 2020-01-22 DIAGNOSIS — J45909 Unspecified asthma, uncomplicated: Secondary | ICD-10-CM | POA: Diagnosis not present

## 2020-01-22 DIAGNOSIS — Z7982 Long term (current) use of aspirin: Secondary | ICD-10-CM | POA: Insufficient documentation

## 2020-01-22 DIAGNOSIS — Z992 Dependence on renal dialysis: Secondary | ICD-10-CM | POA: Insufficient documentation

## 2020-01-22 DIAGNOSIS — D631 Anemia in chronic kidney disease: Secondary | ICD-10-CM | POA: Diagnosis not present

## 2020-01-22 DIAGNOSIS — Z20822 Contact with and (suspected) exposure to covid-19: Secondary | ICD-10-CM | POA: Insufficient documentation

## 2020-01-22 DIAGNOSIS — Z95810 Presence of automatic (implantable) cardiac defibrillator: Secondary | ICD-10-CM | POA: Diagnosis not present

## 2020-01-22 DIAGNOSIS — K8020 Calculus of gallbladder without cholecystitis without obstruction: Secondary | ICD-10-CM | POA: Diagnosis not present

## 2020-01-22 DIAGNOSIS — Z9861 Coronary angioplasty status: Secondary | ICD-10-CM | POA: Diagnosis not present

## 2020-01-22 DIAGNOSIS — Z87442 Personal history of urinary calculi: Secondary | ICD-10-CM | POA: Diagnosis not present

## 2020-01-22 DIAGNOSIS — L03311 Cellulitis of abdominal wall: Secondary | ICD-10-CM | POA: Diagnosis not present

## 2020-01-22 DIAGNOSIS — R109 Unspecified abdominal pain: Secondary | ICD-10-CM | POA: Diagnosis present

## 2020-01-22 DIAGNOSIS — R7309 Other abnormal glucose: Secondary | ICD-10-CM | POA: Diagnosis not present

## 2020-01-22 DIAGNOSIS — N186 End stage renal disease: Secondary | ICD-10-CM | POA: Insufficient documentation

## 2020-01-22 DIAGNOSIS — R82998 Other abnormal findings in urine: Secondary | ICD-10-CM | POA: Diagnosis not present

## 2020-01-22 DIAGNOSIS — K5730 Diverticulosis of large intestine without perforation or abscess without bleeding: Secondary | ICD-10-CM | POA: Diagnosis not present

## 2020-01-22 DIAGNOSIS — Z4932 Encounter for adequacy testing for peritoneal dialysis: Secondary | ICD-10-CM | POA: Diagnosis not present

## 2020-01-22 DIAGNOSIS — R188 Other ascites: Secondary | ICD-10-CM | POA: Insufficient documentation

## 2020-01-22 DIAGNOSIS — N2581 Secondary hyperparathyroidism of renal origin: Secondary | ICD-10-CM | POA: Diagnosis not present

## 2020-01-22 LAB — CBC
HCT: 33.2 % — ABNORMAL LOW (ref 39.0–52.0)
Hemoglobin: 11 g/dL — ABNORMAL LOW (ref 13.0–17.0)
MCH: 31.3 pg (ref 26.0–34.0)
MCHC: 33.1 g/dL (ref 30.0–36.0)
MCV: 94.6 fL (ref 80.0–100.0)
Platelets: 224 10*3/uL (ref 150–400)
RBC: 3.51 MIL/uL — ABNORMAL LOW (ref 4.22–5.81)
RDW: 14.4 % (ref 11.5–15.5)
WBC: 8 10*3/uL (ref 4.0–10.5)
nRBC: 0 % (ref 0.0–0.2)

## 2020-01-22 LAB — COMPREHENSIVE METABOLIC PANEL
ALT: 52 U/L — ABNORMAL HIGH (ref 0–44)
AST: 36 U/L (ref 15–41)
Albumin: 3.1 g/dL — ABNORMAL LOW (ref 3.5–5.0)
Alkaline Phosphatase: 39 U/L (ref 38–126)
Anion gap: 14 (ref 5–15)
BUN: 63 mg/dL — ABNORMAL HIGH (ref 6–20)
CO2: 27 mmol/L (ref 22–32)
Calcium: 7.8 mg/dL — ABNORMAL LOW (ref 8.9–10.3)
Chloride: 100 mmol/L (ref 98–111)
Creatinine, Ser: 14.61 mg/dL — ABNORMAL HIGH (ref 0.61–1.24)
GFR, Estimated: 4 mL/min — ABNORMAL LOW (ref >60)
Glucose, Bld: 93 mg/dL (ref 70–99)
Potassium: 3.8 mmol/L (ref 3.5–5.1)
Sodium: 141 mmol/L (ref 135–145)
Total Bilirubin: 0.6 mg/dL (ref 0.3–1.2)
Total Protein: 5.6 g/dL — ABNORMAL LOW (ref 6.5–8.1)

## 2020-01-22 LAB — SARS CORONAVIRUS 2 BY RT PCR (HOSPITAL ORDER, PERFORMED IN ~~LOC~~ HOSPITAL LAB): SARS Coronavirus 2: NEGATIVE

## 2020-01-22 NOTE — ED Triage Notes (Signed)
Pt c/o abdominal pain/ascites for approx 1hr. States he does PD at home, compliant, on transplant list. States as soon as he started PD this evening, noticed swelling/increase in abdominal fluid. Contacted on-call nurse for dialysis, was advised to come to ED for evaluation

## 2020-01-23 DIAGNOSIS — D631 Anemia in chronic kidney disease: Secondary | ICD-10-CM | POA: Diagnosis not present

## 2020-01-23 DIAGNOSIS — Z992 Dependence on renal dialysis: Secondary | ICD-10-CM | POA: Diagnosis not present

## 2020-01-23 DIAGNOSIS — D513 Other dietary vitamin B12 deficiency anemia: Secondary | ICD-10-CM | POA: Diagnosis not present

## 2020-01-23 DIAGNOSIS — N2589 Other disorders resulting from impaired renal tubular function: Secondary | ICD-10-CM | POA: Diagnosis not present

## 2020-01-23 DIAGNOSIS — K769 Liver disease, unspecified: Secondary | ICD-10-CM | POA: Diagnosis not present

## 2020-01-23 DIAGNOSIS — D509 Iron deficiency anemia, unspecified: Secondary | ICD-10-CM | POA: Diagnosis not present

## 2020-01-23 DIAGNOSIS — R7309 Other abnormal glucose: Secondary | ICD-10-CM | POA: Diagnosis not present

## 2020-01-23 DIAGNOSIS — Z4932 Encounter for adequacy testing for peritoneal dialysis: Secondary | ICD-10-CM | POA: Diagnosis not present

## 2020-01-23 DIAGNOSIS — N186 End stage renal disease: Secondary | ICD-10-CM | POA: Diagnosis not present

## 2020-01-23 DIAGNOSIS — N2581 Secondary hyperparathyroidism of renal origin: Secondary | ICD-10-CM | POA: Diagnosis not present

## 2020-01-23 DIAGNOSIS — R82998 Other abnormal findings in urine: Secondary | ICD-10-CM | POA: Diagnosis not present

## 2020-01-23 MED ORDER — DEXTROSE 5 % IV SOLN
1500.0000 mg | Freq: Once | INTRAVENOUS | Status: AC
  Administered 2020-01-23: 1500 mg via INTRAVENOUS
  Filled 2020-01-23: qty 75

## 2020-01-23 NOTE — Progress Notes (Signed)
Pharmacy Antibiotic Note  Vernon Blackburn is a 55 y.o. male admitted on 01/22/2020 with abd wall cellulitis.  Pharmacy has been consulted for Dalbavancin dosing per Dr. Betsey Holiday. Patient insured. Peritoneal dialysis patient  Plan: Dalbavancin 1.5g IV x 1 in the ER with ambulatory referral to [place] [facility] per protocol      Temp (24hrs), Avg:98.2 F (36.8 C), Min:98.1 F (36.7 C), Max:98.2 F (36.8 C)  Recent Labs  Lab 01/22/20 2109  WBC 8.0  CREATININE 14.61*    CrCl cannot be calculated (Unknown ideal weight.).    Allergies  Allergen Reactions  . Isosorbide     Hypotension     Thank you for allowing pharmacy to be a part of this patient's care.  Sherlon Handing, PharmD, BCPS Please see amion for complete clinical pharmacist phone list 01/23/2020 4:14 AM

## 2020-01-23 NOTE — ED Provider Notes (Signed)
[facility] EMERGENCY DEPARTMENT Provider Note   CSN: [id number] Arrival date & time: 01/22/20  2045     History Chief Complaint  Patient presents with  . Abdominal Pain  . Ascites    Vernon Blackburn is a 55 y.o. male.  Patient presents to the emergency department for evaluation of abdominal distention.  Patient does peritoneal dialysis at home.  He reports that as he was draining his dialysate tonight he noticed his abdomen swelling up.  He did not have any associated abdominal pain.  There has not been nausea, vomiting, diarrhea or constipation.  Patient reports that symptoms currently have improved and he is not experiencing any abdominal pain.  He has not had any fever.  He reports that the fluid he drained through his dialysis catheter was clear, look the same as it always does.        Past Medical History:  Diagnosis Date  . ADHD   . Allergy   . Anemia, chronic disease   . Arthritis    Gout  . Asthma   . Cardiomyopathy [facility])    cardiologist is [employer]? [name], lov in office per patient was last year in 2019   . CHF (congestive heart failure) ([place])    a. 01/2016: echo showing EF of 20-25%, no WMA, severe MR, and PA Peak Pressure of 52 mm Hg.   Marland Kitchen Chronic systolic HF (heart failure) ([place])   . CKD (chronic kidney disease)    Dr [name] ,  stage 4 ; per patient lov  in office march 2020 , per patient at this last visit he was not approved as a candidate for kidney transplant because his kidney fx has improved to greater  than 20  . Elbow pain, left   . Heart murmur   . History of kidney stones   . Hypertension   . Knee pain, bilateral   . Left ankle pain 11/04/2010   Gout  . Leg pain, bilateral   . Peripheral edema    Bilateral lower extremity   . Pneumonia    hx  . Pulmonary HTN ([place])   . S/P minimally invasive mitral valve repair 06/29/2016   30 mm [name] via right mini thoracotomy approach  . Severe mitral  regurgitation   . Sleep apnea    hx bariatric  surgery 3 yrs ago lost 166 lbs  . Syncope and collapse 07/14/2016  . Venous insufficiency     Patient Active Problem List   Diagnosis Date Noted  . ESRD on dialysis ([place]) 04/22/2019  . Long term (current) use of anticoagulants [Z79.01] 07/17/2016  . Syncope and collapse 07/14/2016  . Chest pain 07/13/2016  . Other chest pain   . Coagulopathy ([place])   . S/P minimally invasive mitral valve repair 06/29/2016  . CHF (congestive heart failure) ([place]) 06/06/2016  . Hypertensive heart and chronic kidney disease with heart failure and stage 1 through stage 4 chronic kidney disease, or chronic kidney disease ([place]) 02/27/2016  . Chronic systolic HF (heart failure) ([place])   . Cardiomyopathy ([place])   . Pulmonary HTN ([place])   . DOE (dyspnea on exertion) 01/02/2016  . Chronic kidney disease (CKD), stage IV (severe) ([place])   . Anemia   . Edema 11/15/2011    Past Surgical History:  Procedure Laterality Date  . AV FISTULA PLACEMENT Left 03/10/2019   Procedure: LEFT ARM BASILIC ARTERIOVENOUS (AV) FISTULA CREATION FIRST STAGE;  Surgeon: Marty Heck, MD;  Location: [facility] OR;  Service: Vascular;  Laterality: Left;  . BASCILIC VEIN TRANSPOSITION Left 05/01/2019   Procedure: LEFT SECOND STAGE BASCILIC VEIN TRANSPOSITION.;  Surgeon: Marty Heck, MD;  Location: [place];  Service: Vascular;  Laterality: Left;  . CYSTOSCOPY/URETEROSCOPY/HOLMIUM LASER/STENT PLACEMENT Right 07/17/2018   Procedure: CYSTOSCOPY/RETROGRADE/URETEROSCOPY/HOLMIUM LASER/STENT PLACEMENT/ CYSTOLITHALOPAXY;  Surgeon: Ceasar Mons, MD;  Location: [facility];  Service: Urology;  Laterality: Right;  . GASTRIC BYPASS  2014  . HERNIA REPAIR    . MENISCUS REPAIR Right 05/2008   right  knee  . MITRAL VALVE REPAIR Right 06/29/2016   Procedure: MINIMALLY INVASIVE MITRAL VALVE REPAIR  (MVR);  Surgeon: [name], MD;  Location: [place];  Service: Open Heart Surgery;  Laterality:  Right;  . RIGHT/LEFT HEART CATH AND CORONARY ANGIOGRAPHY N/A 06/07/2016   Procedure: Right/Left Heart Cath and Coronary Angiography;  Surgeon: Larey Dresser, MD;  Location: [place] CV LAB;  Service: Cardiovascular;  Laterality: N/A;  . TEE WITHOUT CARDIOVERSION N/A 05/04/2016   Procedure: TRANSESOPHAGEAL ECHOCARDIOGRAM (TEE);  Surgeon: Larey Dresser, MD;  Location: [facility] ENDOSCOPY;  Service: Cardiovascular;  Laterality: N/A;  . TEE WITHOUT CARDIOVERSION N/A 06/29/2016   Procedure: TRANSESOPHAGEAL ECHOCARDIOGRAM (TEE);  Surgeon: [name], MD;  Location: [place];  Service: Open Heart Surgery;  Laterality: N/A;  . TEE WITHOUT CARDIOVERSION N/A 03/06/2019   Procedure: TRANSESOPHAGEAL ECHOCARDIOGRAM (TEE);  Surgeon: Larey Dresser, MD;  Location: [facility] ENDOSCOPY;  Service: Cardiovascular;  Laterality: N/A;       Family History  Problem Relation Age of Onset  . Diabetes Mother   . Hypertension Mother   . Heart failure Mother   . Colon cancer Neg Hx   . Esophageal cancer Neg Hx   . Rectal cancer Neg Hx   . Stomach cancer Neg Hx     Social History   Tobacco Use  . Smoking status: Never Smoker  . Smokeless tobacco: Never Used  Vaping Use  . Vaping Use: Never used  Substance Use Topics  . Alcohol use: Not Currently  . Drug use: No    Home Medications Prior to Admission medications   Medication Sig Start Date End Date Taking? Authorizing Provider  albuterol (PROVENTIL HFA;VENTOLIN HFA) 108 (90 Base) MCG/ACT inhaler Inhale 2 puffs into the lungs every 6 (six) hours as needed for wheezing or shortness of breath. 06/11/17   Larey Dresser, MD  Albuterol Sulfate (PROAIR RESPICLICK IN) 2 puffs daily as needed. 09/01/19   [provider]  aspirin EC 81 MG tablet Take 1 tablet (81 mg total) by mouth daily. Patient taking differently: Take 81 mg by mouth in the morning and at bedtime. 10/03/16   Larey Dresser, MD  bismuth subsalicylate (PEPTO BISMOL) 262 MG/15ML suspension  Take 30 mLs by mouth every 6 (six) hours as needed for indigestion.    [provider]  calcium acetate (PHOSLO) 667 MG capsule Take 2,668 mg by mouth 3 (three) times daily. 11/22/19   [provider]  carvedilol (COREG) 12.5 MG tablet Take 12.5 mg by mouth in the morning and at bedtime.    [provider]  cefdinir (OMNICEF) 300 MG capsule 1 capsule in the morning and at bedtime. 12/04/19   [provider]  febuxostat (ULORIC) 40 MG tablet Take 40 mg by mouth daily.  01/23/19   [provider]  fluticasone (FLONASE) 50 MCG/ACT nasal spray Place 1 spray into both nostrils daily as needed for allergies or rhinitis.  [provider]  furosemide (LASIX) 80 MG tablet Take 80 mg by mouth in the morning and at bedtime. 02/14/19   [provider]  sevelamer carbonate (RENVELA) 800 MG tablet Take 800 mg by mouth 3 (three) times daily with meals.    [provider]  sildenafil (REVATIO) 20 MG tablet Take by mouth daily as needed. 12/30/18   [provider]  tamsulosin (FLOMAX) 0.4 MG CAPS capsule Take 0.4 mg by mouth in the morning and at bedtime. Take 1 tab bid    [provider]  VYVANSE 30 MG capsule Take 30 mg by mouth every morning. 06/21/19   [provider]    Allergies    Isosorbide  Review of Systems   Review of Systems  Constitutional: Negative for fatigue.  Gastrointestinal: Positive for abdominal distention.  All other systems reviewed and are negative.   Physical Exam Updated Vital Signs BP (!) 138/104   Pulse 81   Temp 98.1 F (36.7 C) (Oral)   Resp 19   SpO2 100%   Physical Exam Vitals and nursing note reviewed.  Constitutional:      General: He is not in acute distress.    Appearance: Normal appearance. He is well-developed and well-nourished.  HENT:     Head: Normocephalic and atraumatic.     Right Ear: Hearing normal.     Left Ear: Hearing normal.     Nose: Nose normal.      Mouth/Throat:     Mouth: Oropharynx is clear and moist and mucous membranes are normal.  Eyes:     Extraocular Movements: EOM normal.     Conjunctiva/sclera: Conjunctivae normal.     Pupils: Pupils are equal, round, and reactive to light.  Cardiovascular:     Rate and Rhythm: Regular rhythm.     Heart sounds: S1 normal and S2 normal. No murmur heard. No friction rub. No gallop.   Pulmonary:     Effort: Pulmonary effort is normal. No respiratory distress.     Breath sounds: Normal breath sounds.  Chest:     Chest wall: No tenderness.  Abdominal:     General: Bowel sounds are normal.     Palpations: Abdomen is soft. There is no hepatosplenomegaly.     Tenderness: There is no abdominal tenderness. There is no guarding or rebound. Negative signs include Murphy's sign and McBurney's sign.     Hernia: No hernia is present.  Musculoskeletal:        General: Normal range of motion.     Cervical back: Normal range of motion and neck supple.  Skin:    General: Skin is warm, dry and intact.     Findings: No rash.     Nails: There is no cyanosis.     Comments: No erythema or warmth of abdominal wall.  Site around dialysis catheter has no drainage, erythema, induration or fluctuance.  Neurological:     Mental Status: He is alert and oriented to person, place, and time.     GCS: GCS eye subscore is 4. GCS verbal subscore is 5. GCS motor subscore is 6.     Cranial Nerves: No cranial nerve deficit.     Sensory: No sensory deficit.     Coordination: Coordination normal.     Deep Tendon Reflexes: Strength normal.  Psychiatric:        Mood and Affect: Mood and affect normal.        Speech: Speech normal.  Behavior: Behavior normal.        Thought Content: Thought content normal.     ED Results / Procedures / Treatments   Labs (all labs ordered are listed, but only abnormal results are displayed) Labs Reviewed  CBC - Abnormal; Notable for the following components:      Result  Value   RBC 3.51 (*)    Hemoglobin 11.0 (*)    HCT 33.2 (*)    All other components within normal limits  COMPREHENSIVE METABOLIC PANEL - Abnormal; Notable for the following components:   BUN 63 (*)    Creatinine, Ser 14.61 (*)    Calcium 7.8 (*)    Total Protein 5.6 (*)    Albumin 3.1 (*)    ALT 52 (*)    GFR, Estimated 4 (*)    All other components within normal limits  SARS CORONAVIRUS 2 BY RT PCR (HOSPITAL ORDER, [place])  URINE CULTURE  URINALYSIS, ROUTINE W REFLEX MICROSCOPIC    EKG None  Radiology CT ABDOMEN PELVIS WO CONTRAST  Result Date: 01/22/2020 CLINICAL DATA:  Abdominal pain and swelling. EXAM: CT ABDOMEN AND PELVIS WITHOUT CONTRAST TECHNIQUE: Multidetector CT imaging of the abdomen and pelvis was performed following the standard protocol without IV contrast. COMPARISON:  January 17, 2019 FINDINGS: Lower chest: No acute abnormality. Hepatobiliary: A 1.0 cm diameter cystic appearing area is seen within the inferolateral aspect of the right lobe of the liver. Multiple tiny gallstones are seen within the lumen of an otherwise normal-appearing gallbladder. Pancreas: Unremarkable. No pancreatic ductal dilatation or surrounding inflammatory changes. Spleen: Normal in size without focal abnormality. Adrenals/Urinary Tract: Adrenal glands are unremarkable. The kidneys are small in size, without obstructing renal calculi or hydronephrosis. A stable 2.3 cm x 1.1 cm cystic area is seen within the posterolateral aspect of the mid right kidney. A cluster of subcentimeter non-obstructing renal stones is seen within the lower pole of the right kidney. Additional 2 mm and 3 mm nonobstructing renal stones are seen within the left kidney. 4 mm calcifications are seen within the dependent portion of the urinary bladder. These are present on the prior study. Stomach/Bowel: Surgical sutures are seen within the gastric region. The appendix is normal in appearance. No  evidence of bowel dilatation. Noninflamed diverticula are seen within the proximal sigmoid colon. Vascular/Lymphatic: Mild aortic atherosclerosis. No enlarged abdominal or pelvic lymph nodes. Reproductive: There is mild to moderate severity prostate gland enlargement. Other: A small amount of free air is seen within the left upper quadrant and anterior aspect of the mid to upper abdomen on the left. A peritoneal dialysis catheter is seen with its distal tip noted within the anterior aspect of the lower pelvis on the left. Marked severity subcutaneous inflammatory fat stranding is seen along the anterior and lateral aspects of the lower abdominal and pelvic wall, right greater than left. A small amount of perihepatic, perisplenic and posterior pelvic free fluid is seen. Musculoskeletal: Degenerative changes seen within the lumbar spine, most prominent at the level of L3-L4 and L5-S1. IMPRESSION: 1. Marked severity cellulitis involving the anterior and lateral aspects of the lower abdominal and pelvic wall, right greater than left. 2. Small amount of free fluid within the abdomen and pelvis, likely related to the patient's peritoneal dialysis catheter. 3. Small amount of free air within the upper abdomen, as described above, which may also be related to recent peritoneal dialysis. While no abnormal bowel loops are clearly identified, the presence of  a perforated hollow viscus cannot completely be excluded. 4. Cholelithiasis. 5. Bilateral nonobstructing renal calculi with additional findings likely consistent with small bladder calculi. 6. Sigmoid diverticulosis. 7. Enlarged prostate gland. 8. Mild aortic atherosclerosis. Aortic Atherosclerosis (ICD10-I70.0). Electronically Signed   By: Virgina Norfolk M.D.   On: 01/22/2020 21:41    Procedures Procedures (including critical care time)  Medications Ordered in ED Medications - No data to display  ED Course  I have reviewed the triage vital signs and the  nursing notes.  Pertinent labs & imaging results that were available during my care of the patient were reviewed by me and considered in my medical decision making (see chart for details).    MDM Rules/Calculators/A&P                          Patient presents to the emergency department for evaluation of abdominal distention that he noted while draining his peritoneal dialysis fluid.  He did not have any pain associated with the distention and this has resolved.  Patient is afebrile.  Lab work unremarkable.  Vital signs unremarkable.  Abdominal exam is soft and nontender.  Abdominal wall appears normal, no external signs of cellulitis.  CT scan has been read as cellulitis involving the anterior and lateral aspects of the lower abdominal and pelvic wall.  There is not any overlying induration or erythema, but I did review the images.  There does not appear to be extensive inflammatory changes in the abdominal wall that were not seen on the most recent CT.  No drainage from around the dialysis catheter site, but this certainly would be an entry point for infection.   Based on the severity of inflammation of the abdominal wall, I recommended admission for IV antibiotic therapy.  After lengthy discussion with the patient, he declines admission.  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  There is no one else that can do this.  As this does appear to be soft tissue infection, will prescribe single dose of Dalvance.  Patient given return precautions for worsening signs of infection and fever.  Final Clinical Impression(s) / ED Diagnoses Final diagnoses:  Cellulitis of abdominal wall    Rx / DC Orders ED Discharge Orders    None       Orpah Greek, MD 01/23/20 [phone]

## 2020-01-24 DIAGNOSIS — N186 End stage renal disease: Secondary | ICD-10-CM | POA: Diagnosis not present

## 2020-01-24 DIAGNOSIS — N2589 Other disorders resulting from impaired renal tubular function: Secondary | ICD-10-CM | POA: Diagnosis not present

## 2020-01-24 DIAGNOSIS — Z992 Dependence on renal dialysis: Secondary | ICD-10-CM | POA: Diagnosis not present

## 2020-01-24 DIAGNOSIS — D513 Other dietary vitamin B12 deficiency anemia: Secondary | ICD-10-CM | POA: Diagnosis not present

## 2020-01-24 DIAGNOSIS — D509 Iron deficiency anemia, unspecified: Secondary | ICD-10-CM | POA: Diagnosis not present

## 2020-01-24 DIAGNOSIS — Z4932 Encounter for adequacy testing for peritoneal dialysis: Secondary | ICD-10-CM | POA: Diagnosis not present

## 2020-01-24 DIAGNOSIS — N2581 Secondary hyperparathyroidism of renal origin: Secondary | ICD-10-CM | POA: Diagnosis not present

## 2020-01-24 DIAGNOSIS — K769 Liver disease, unspecified: Secondary | ICD-10-CM | POA: Diagnosis not present

## 2020-01-24 DIAGNOSIS — D631 Anemia in chronic kidney disease: Secondary | ICD-10-CM | POA: Diagnosis not present

## 2020-01-24 DIAGNOSIS — R82998 Other abnormal findings in urine: Secondary | ICD-10-CM | POA: Diagnosis not present

## 2020-01-24 DIAGNOSIS — R7309 Other abnormal glucose: Secondary | ICD-10-CM | POA: Diagnosis not present

## 2020-01-25 DIAGNOSIS — D509 Iron deficiency anemia, unspecified: Secondary | ICD-10-CM | POA: Diagnosis not present

## 2020-01-25 DIAGNOSIS — N2589 Other disorders resulting from impaired renal tubular function: Secondary | ICD-10-CM | POA: Diagnosis not present

## 2020-01-25 DIAGNOSIS — R82998 Other abnormal findings in urine: Secondary | ICD-10-CM | POA: Diagnosis not present

## 2020-01-25 DIAGNOSIS — K769 Liver disease, unspecified: Secondary | ICD-10-CM | POA: Diagnosis not present

## 2020-01-25 DIAGNOSIS — N2581 Secondary hyperparathyroidism of renal origin: Secondary | ICD-10-CM | POA: Diagnosis not present

## 2020-01-25 DIAGNOSIS — N186 End stage renal disease: Secondary | ICD-10-CM | POA: Diagnosis not present

## 2020-01-25 DIAGNOSIS — Z4932 Encounter for adequacy testing for peritoneal dialysis: Secondary | ICD-10-CM | POA: Diagnosis not present

## 2020-01-25 DIAGNOSIS — Z992 Dependence on renal dialysis: Secondary | ICD-10-CM | POA: Diagnosis not present

## 2020-01-25 DIAGNOSIS — R7309 Other abnormal glucose: Secondary | ICD-10-CM | POA: Diagnosis not present

## 2020-01-25 DIAGNOSIS — D631 Anemia in chronic kidney disease: Secondary | ICD-10-CM | POA: Diagnosis not present

## 2020-01-25 DIAGNOSIS — D513 Other dietary vitamin B12 deficiency anemia: Secondary | ICD-10-CM | POA: Diagnosis not present

## 2020-01-26 DIAGNOSIS — R82998 Other abnormal findings in urine: Secondary | ICD-10-CM | POA: Diagnosis not present

## 2020-01-26 DIAGNOSIS — Z4932 Encounter for adequacy testing for peritoneal dialysis: Secondary | ICD-10-CM | POA: Diagnosis not present

## 2020-01-26 DIAGNOSIS — Z992 Dependence on renal dialysis: Secondary | ICD-10-CM | POA: Diagnosis not present

## 2020-01-26 DIAGNOSIS — K769 Liver disease, unspecified: Secondary | ICD-10-CM | POA: Diagnosis not present

## 2020-01-26 DIAGNOSIS — N186 End stage renal disease: Secondary | ICD-10-CM | POA: Diagnosis not present

## 2020-01-26 DIAGNOSIS — D631 Anemia in chronic kidney disease: Secondary | ICD-10-CM | POA: Diagnosis not present

## 2020-01-26 DIAGNOSIS — R7309 Other abnormal glucose: Secondary | ICD-10-CM | POA: Diagnosis not present

## 2020-01-26 DIAGNOSIS — D513 Other dietary vitamin B12 deficiency anemia: Secondary | ICD-10-CM | POA: Diagnosis not present

## 2020-01-26 DIAGNOSIS — N2589 Other disorders resulting from impaired renal tubular function: Secondary | ICD-10-CM | POA: Diagnosis not present

## 2020-01-26 DIAGNOSIS — N2581 Secondary hyperparathyroidism of renal origin: Secondary | ICD-10-CM | POA: Diagnosis not present

## 2020-01-26 DIAGNOSIS — D509 Iron deficiency anemia, unspecified: Secondary | ICD-10-CM | POA: Diagnosis not present

## 2020-01-27 ENCOUNTER — Other Ambulatory Visit: Payer: Self-pay

## 2020-01-27 ENCOUNTER — Encounter: Payer: Self-pay | Admitting: Internal Medicine

## 2020-01-27 ENCOUNTER — Ambulatory Visit (INDEPENDENT_AMBULATORY_CARE_PROVIDER_SITE_OTHER): Payer: Medicare Other | Admitting: Internal Medicine

## 2020-01-27 VITALS — BP 148/101 | HR 89 | Temp 98.3°F | Ht 70.0 in | Wt 204.0 lb

## 2020-01-27 DIAGNOSIS — L03311 Cellulitis of abdominal wall: Secondary | ICD-10-CM

## 2020-01-27 DIAGNOSIS — N2589 Other disorders resulting from impaired renal tubular function: Secondary | ICD-10-CM | POA: Diagnosis not present

## 2020-01-27 DIAGNOSIS — Z992 Dependence on renal dialysis: Secondary | ICD-10-CM | POA: Diagnosis not present

## 2020-01-27 DIAGNOSIS — N200 Calculus of kidney: Secondary | ICD-10-CM | POA: Diagnosis not present

## 2020-01-27 DIAGNOSIS — R350 Frequency of micturition: Secondary | ICD-10-CM | POA: Diagnosis not present

## 2020-01-27 DIAGNOSIS — N186 End stage renal disease: Secondary | ICD-10-CM

## 2020-01-27 DIAGNOSIS — D631 Anemia in chronic kidney disease: Secondary | ICD-10-CM | POA: Diagnosis not present

## 2020-01-27 DIAGNOSIS — R82998 Other abnormal findings in urine: Secondary | ICD-10-CM | POA: Diagnosis not present

## 2020-01-27 DIAGNOSIS — Z4932 Encounter for adequacy testing for peritoneal dialysis: Secondary | ICD-10-CM | POA: Diagnosis not present

## 2020-01-27 DIAGNOSIS — Z872 Personal history of diseases of the skin and subcutaneous tissue: Secondary | ICD-10-CM

## 2020-01-27 DIAGNOSIS — N2581 Secondary hyperparathyroidism of renal origin: Secondary | ICD-10-CM | POA: Diagnosis not present

## 2020-01-27 DIAGNOSIS — R7309 Other abnormal glucose: Secondary | ICD-10-CM | POA: Diagnosis not present

## 2020-01-27 DIAGNOSIS — D513 Other dietary vitamin B12 deficiency anemia: Secondary | ICD-10-CM | POA: Diagnosis not present

## 2020-01-27 DIAGNOSIS — D509 Iron deficiency anemia, unspecified: Secondary | ICD-10-CM | POA: Diagnosis not present

## 2020-01-27 DIAGNOSIS — K769 Liver disease, unspecified: Secondary | ICD-10-CM | POA: Diagnosis not present

## 2020-01-27 HISTORY — DX: Personal history of diseases of the skin and subcutaneous tissue: Z87.2

## 2020-01-27 NOTE — Progress Notes (Signed)
[place] [facility]  Reason for Consult: Cellulitis  Referring Provider: ED provider   HPI:    Vernon Blackburn is a 56 y.o. male who presents to clinic for further evaluation of cellulitis of the abdominal wall.  He was recently seen in the emergency department on January 23, 2020 for evaluation of abdominal distention.  This is in the setting of doing home peritoneal dialysis.  On that date, he reported that he was draining his dialysate as he normally does and noticed his abdomen swelling up.  This was not associated with any fevers, chills or abdominal pain in particular.  He also reports that the fluid he drained through his dialysis catheter was clear and looked the same as it usually did.  In the ER he was found to be afebrile with out leukocytosis.  CT of the abdomen and pelvis was notable for cellulitis involving the anterior and lateral aspects of the lower abdominal and pelvic wall, right greater than left as well as a small amount of free fluid within the abdomen and pelvis, likely related to his peritoneal dialysis catheter.    It was discussed with him regarding admission for antibiotics versus dalbavancin infusion followed by outpatient follow-up.  Patient chose the latter and presents today for follow-up.  He reports since being seen in the ED 4 days ago he has continued to have some abdominal swelling.  He reports no fevers.  He does report occasional chill.  He followed up with his nephrologist yesterday and reports there were no new recommendations regarding adjusting his dialysate to help with abdominal swelling.  He also reports that his dialysate fluid was sampled and he was told there was no evidence of peritoneal infection.  Additionally, he saw urology today and reports having a in and out catheterization performed.  He was told his urine showed some white blood cells and it was being sent for culture.  No oral antibiotics were added at this time.  He  denies any purulence or drainage from his PD catheter site, no shortness of breath, no chest pain.    Patient's Medications  New Prescriptions   No medications on file  Previous Medications   ALBUTEROL (PROVENTIL HFA;VENTOLIN HFA) 108 (90 BASE) MCG/ACT INHALER    Inhale 2 puffs into the lungs every 6 (six) hours as needed for wheezing or shortness of breath.   ALBUTEROL SULFATE (PROAIR RESPICLICK IN)    2 puffs daily as needed.   ASPIRIN EC 81 MG TABLET    Take 1 tablet (81 mg total) by mouth daily.   BISMUTH SUBSALICYLATE (PEPTO BISMOL) 262 MG/15ML SUSPENSION    Take 30 mLs by mouth every 6 (six) hours as needed for indigestion.   CALCIUM ACETATE (PHOSLO) 667 MG CAPSULE    Take 2,668 mg by mouth 3 (three) times daily.   CARVEDILOL (COREG) 12.5 MG TABLET    Take 12.5 mg by mouth in the morning and at bedtime.   CEFDINIR (OMNICEF) 300 MG CAPSULE    1 capsule in the morning and at bedtime.   FEBUXOSTAT (ULORIC) 40 MG TABLET    Take 40 mg by mouth daily.    FLUTICASONE (FLONASE) 50 MCG/ACT NASAL SPRAY    Place 1 spray into both nostrils daily as needed for allergies or rhinitis.    FUROSEMIDE (LASIX) 80 MG TABLET    Take 80 mg by mouth in the morning and at bedtime.   SEVELAMER CARBONATE (RENVELA) 800 MG  TABLET    Take 800 mg by mouth 3 (three) times daily with meals.   SILDENAFIL (REVATIO) 20 MG TABLET    Take by mouth daily as needed.   TAMSULOSIN (FLOMAX) 0.4 MG CAPS CAPSULE    Take 0.4 mg by mouth in the morning and at bedtime. Take 1 tab bid   VYVANSE 30 MG CAPSULE    Take 30 mg by mouth every morning.  Modified Medications   No medications on file  Discontinued Medications   No medications on file      Past Medical History:  Diagnosis Date  . ADHD   . Allergy   . Anemia, chronic disease   . Arthritis    Gout  . Asthma   . Cardiomyopathy [facility])    cardiologist is [employer]? [name], lov in office per patient was last year in 2019   . CHF (congestive heart failure) ([place])     a. 01/2016: echo showing EF of 20-25%, no WMA, severe MR, and PA Peak Pressure of 52 mm Hg.   Marland Kitchen Chronic systolic HF (heart failure) ([place])   . CKD (chronic kidney disease)    Dr [name] ,  stage 4 ; per patient lov  in office march 2020 , per patient at this last visit he was not approved as a candidate for kidney transplant because his kidney fx has improved to greater  than 20  . Elbow pain, left   . Heart murmur   . History of kidney stones   . Hypertension   . Knee pain, bilateral   . Left ankle pain 11/04/2010   Gout  . Leg pain, bilateral   . Peripheral edema    Bilateral lower extremity   . Pneumonia    hx  . Pulmonary HTN ([place])   . S/P minimally invasive mitral valve repair 06/29/2016   30 mm [name] via right mini thoracotomy approach  . Severe mitral regurgitation   . Sleep apnea    hx bariatric  surgery 3 yrs ago lost 166 lbs  . Syncope and collapse 07/14/2016  . Venous insufficiency     Social History   Tobacco Use  . Smoking status: Never Smoker  . Smokeless tobacco: Never Used  Vaping Use  . Vaping Use: Never used  Substance Use Topics  . Alcohol use: Not Currently  . Drug use: No    Family History  Problem Relation Age of Onset  . Diabetes Mother   . Hypertension Mother   . Heart failure Mother   . Colon cancer Neg Hx   . Esophageal cancer Neg Hx   . Rectal cancer Neg Hx   . Stomach cancer Neg Hx     Allergies  Allergen Reactions  . Isosorbide     Hypotension     Review of Systems  Constitutional: Positive for chills. Negative for fever.  Respiratory: Negative for shortness of breath.   Cardiovascular: Negative for chest pain.  Gastrointestinal: Positive for abdominal pain.      OBJECTIVE:    Vitals:   01/27/20 1525  Weight: 204 lb (92.5 kg)  Height: 5\' 10"  (1.778 m)     Body mass index is 29.27 kg/m.  Physical Exam Constitutional:      General: He is not in acute distress.    Appearance: Normal appearance.   HENT:     Head: Normocephalic and atraumatic.  Pulmonary:     Effort: Pulmonary effort is normal. No respiratory distress.  Abdominal:     General: There is distension.     Palpations: Abdomen is soft.     Tenderness: There is abdominal tenderness. There is no guarding or rebound.     Comments:   Abdomen is soft, mildly tender to palpation.  There is mild warmth but no erythema.  There is no tenderness or purulence surrounding his PD catheter site.  Neurological:     Mental Status: He is alert.      Labs and Microbiology:  CBC Latest Ref Rng & Units 01/22/2020 05/01/2019 03/10/2019  WBC 4.0 - 10.5 K/uL 8.0 - -  Hemoglobin 13.0 - 17.0 g/dL 11.0(L) 15.0 9.9(L)  Hematocrit 39.0 - 52.0 % 33.2(L) 44.0 29.0(L)  Platelets 150 - 400 K/uL 224 - -   CMP Latest Ref Rng & Units 01/22/2020 05/01/2019 03/10/2019  Glucose 70 - 99 mg/dL 93 83 83  BUN 6 - 20 mg/dL 63(H) 56(H) 69(H)  Creatinine 0.61 - 1.24 mg/dL 14.61(H) 10.10(H) 13.10(H)  Sodium 135 - 145 mmol/L 141 138 140  Potassium 3.5 - 5.1 mmol/L 3.8 4.3 4.0  Chloride 98 - 111 mmol/L 100 99 105  CO2 22 - 32 mmol/L 27 - -  Calcium 8.9 - 10.3 mg/dL 7.8(L) - -  Total Protein 6.5 - 8.1 g/dL 5.6(L) - -  Total Bilirubin 0.3 - 1.2 mg/dL 0.6 - -  Alkaline Phos 38 - 126 U/L 39 - -  AST 15 - 41 U/L 36 - -  ALT 0 - 44 U/L 52(H) - -     Recent Results (from the past 240 hour(s))  SARS Coronavirus 2 by RT PCR (hospital order, performed in [facility] hospital lab) Nasopharyngeal Nasopharyngeal Swab     Status: None   Collection Time: 01/22/20  9:08 PM   Specimen: Nasopharyngeal Swab  Result Value Ref Range Status   SARS Coronavirus 2 NEGATIVE NEGATIVE Final    Comment: (NOTE) SARS-CoV-2 target nucleic acids are NOT DETECTED.  The SARS-CoV-2 RNA is generally detectable in upper and lower respiratory specimens during the acute phase of infection. The lowest concentration of SARS-CoV-2 viral copies this assay can detect is 250 copies / mL. A  negative result does not preclude SARS-CoV-2 infection and should not be used as the sole basis for treatment or other patient management decisions.  A negative result may occur with improper specimen collection / handling, submission of specimen other than nasopharyngeal swab, presence of viral mutation(s) within the areas targeted by this assay, and inadequate number of viral copies (<250 copies / mL). A negative result must be combined with clinical observations, patient history, and epidemiological information.  Fact Sheet for Patients:   StrictlyIdeas.no  Fact Sheet for Healthcare Providers: BankingDealers.co.za  This test is not yet approved or  cleared by the [place] FDA and has been authorized for detection and/or diagnosis of SARS-CoV-2 by FDA under an Emergency Use Authorization (EUA).  This EUA will remain in effect (meaning this test can be used) for the duration of the COVID-19 declaration under Section 564(b)(1) of the Act, 21 U.S.C. section 360bbb-3(b)(1), unless the authorization is terminated or revoked sooner.  Performed at [facility], [place] [address] [place], [place] [address]     Imaging: CT abdomen/pelvis w/o contrast 01/22/20: IMPRESSION: 1. Marked severity cellulitis involving the anterior and lateral aspects of the lower abdominal and pelvic wall, right greater than left. 2. Small amount of free fluid within the abdomen and pelvis, likely related to the patient's peritoneal dialysis  catheter. 3. Small amount of free air within the upper abdomen, as described above, which may also be related to recent peritoneal dialysis. While no abnormal bowel loops are clearly identified, the presence of a perforated hollow viscus cannot completely be excluded. 4. Cholelithiasis. 5. Bilateral nonobstructing renal calculi with additional findings likely consistent with small bladder calculi. 6. Sigmoid  diverticulosis. 7. Enlarged prostate gland. 8. Mild aortic atherosclerosis.   ASSESSMENT & PLAN:    1. ESRD on dialysis [facility]) 2. Abdominal wall cellulitis Patient has abdominal wall cellulitis status post dalbavancin infusion in the emergency department approximately 4 days ago.  He has no fevers and no evidence of leukocytosis on recent lab work.  Discussed with patient that occasionally with cellulitis there is transient worsening prior to noticing improvement and this may be the case with him.  It sounds like he has been evaluated by nephrology to rule out bacterial peritonitis associated with peritoneal dialysis which is reassuring.  Also discussed with patient that dalbavancin infusion is a long-acting antibiotic that will stay in his system for approximately 2 weeks and so currently there is no indication to add additional therapy.  I will plan to see him back in approximately 1 week and determine if he is improving or if there will be an indication to extend his antibiotics at that point.  Discussed return precautions with patient in case he were to worsen before that time.   Raynelle Highland for Infectious Disease [facility] 01/27/2020, 3:26 PM

## 2020-01-27 NOTE — Patient Instructions (Signed)
Thank you for coming to see me today. It was a pleasure seeing you.  To Do: Marland Kitchen Continue following instructions given to you by your kidney doctors to manage your peritoneal dialysis . The antibiotic they gave you in the ED lasts in your system for 2 weeks so you are still getting treated.  Occasionally with cellulitis things get worse before getting better and I hope you improve over the next 7 days . Please let us know if your symptoms worsen before seeing me again  If you have any questions or concerns, please do not hesitate to call the office at [phone].  Take Care,   Jule Ser, DO

## 2020-01-28 DIAGNOSIS — D513 Other dietary vitamin B12 deficiency anemia: Secondary | ICD-10-CM | POA: Diagnosis not present

## 2020-01-28 DIAGNOSIS — R82998 Other abnormal findings in urine: Secondary | ICD-10-CM | POA: Diagnosis not present

## 2020-01-28 DIAGNOSIS — N186 End stage renal disease: Secondary | ICD-10-CM | POA: Diagnosis not present

## 2020-01-28 DIAGNOSIS — R7309 Other abnormal glucose: Secondary | ICD-10-CM | POA: Diagnosis not present

## 2020-01-28 DIAGNOSIS — K769 Liver disease, unspecified: Secondary | ICD-10-CM | POA: Diagnosis not present

## 2020-01-28 DIAGNOSIS — N2589 Other disorders resulting from impaired renal tubular function: Secondary | ICD-10-CM | POA: Diagnosis not present

## 2020-01-28 DIAGNOSIS — D509 Iron deficiency anemia, unspecified: Secondary | ICD-10-CM | POA: Diagnosis not present

## 2020-01-28 DIAGNOSIS — Z4932 Encounter for adequacy testing for peritoneal dialysis: Secondary | ICD-10-CM | POA: Diagnosis not present

## 2020-01-28 DIAGNOSIS — D631 Anemia in chronic kidney disease: Secondary | ICD-10-CM | POA: Diagnosis not present

## 2020-01-28 DIAGNOSIS — Z992 Dependence on renal dialysis: Secondary | ICD-10-CM | POA: Diagnosis not present

## 2020-01-28 DIAGNOSIS — N2581 Secondary hyperparathyroidism of renal origin: Secondary | ICD-10-CM | POA: Diagnosis not present

## 2020-01-29 DIAGNOSIS — N2581 Secondary hyperparathyroidism of renal origin: Secondary | ICD-10-CM | POA: Diagnosis not present

## 2020-01-29 DIAGNOSIS — D509 Iron deficiency anemia, unspecified: Secondary | ICD-10-CM | POA: Diagnosis not present

## 2020-01-29 DIAGNOSIS — R82998 Other abnormal findings in urine: Secondary | ICD-10-CM | POA: Diagnosis not present

## 2020-01-29 DIAGNOSIS — R7309 Other abnormal glucose: Secondary | ICD-10-CM | POA: Diagnosis not present

## 2020-01-29 DIAGNOSIS — D631 Anemia in chronic kidney disease: Secondary | ICD-10-CM | POA: Diagnosis not present

## 2020-01-29 DIAGNOSIS — Z4932 Encounter for adequacy testing for peritoneal dialysis: Secondary | ICD-10-CM | POA: Diagnosis not present

## 2020-01-29 DIAGNOSIS — N2589 Other disorders resulting from impaired renal tubular function: Secondary | ICD-10-CM | POA: Diagnosis not present

## 2020-01-29 DIAGNOSIS — Z992 Dependence on renal dialysis: Secondary | ICD-10-CM | POA: Diagnosis not present

## 2020-01-29 DIAGNOSIS — N186 End stage renal disease: Secondary | ICD-10-CM | POA: Diagnosis not present

## 2020-01-29 DIAGNOSIS — D513 Other dietary vitamin B12 deficiency anemia: Secondary | ICD-10-CM | POA: Diagnosis not present

## 2020-01-29 DIAGNOSIS — K769 Liver disease, unspecified: Secondary | ICD-10-CM | POA: Diagnosis not present

## 2020-01-30 DIAGNOSIS — R7309 Other abnormal glucose: Secondary | ICD-10-CM | POA: Diagnosis not present

## 2020-01-30 DIAGNOSIS — D631 Anemia in chronic kidney disease: Secondary | ICD-10-CM | POA: Diagnosis not present

## 2020-01-30 DIAGNOSIS — N2581 Secondary hyperparathyroidism of renal origin: Secondary | ICD-10-CM | POA: Diagnosis not present

## 2020-01-30 DIAGNOSIS — Z4932 Encounter for adequacy testing for peritoneal dialysis: Secondary | ICD-10-CM | POA: Diagnosis not present

## 2020-01-30 DIAGNOSIS — D513 Other dietary vitamin B12 deficiency anemia: Secondary | ICD-10-CM | POA: Diagnosis not present

## 2020-01-30 DIAGNOSIS — K769 Liver disease, unspecified: Secondary | ICD-10-CM | POA: Diagnosis not present

## 2020-01-30 DIAGNOSIS — N186 End stage renal disease: Secondary | ICD-10-CM | POA: Diagnosis not present

## 2020-01-30 DIAGNOSIS — R82998 Other abnormal findings in urine: Secondary | ICD-10-CM | POA: Diagnosis not present

## 2020-01-30 DIAGNOSIS — N2589 Other disorders resulting from impaired renal tubular function: Secondary | ICD-10-CM | POA: Diagnosis not present

## 2020-01-30 DIAGNOSIS — Z992 Dependence on renal dialysis: Secondary | ICD-10-CM | POA: Diagnosis not present

## 2020-01-30 DIAGNOSIS — D509 Iron deficiency anemia, unspecified: Secondary | ICD-10-CM | POA: Diagnosis not present

## 2020-01-31 DIAGNOSIS — Z992 Dependence on renal dialysis: Secondary | ICD-10-CM | POA: Diagnosis not present

## 2020-01-31 DIAGNOSIS — R82998 Other abnormal findings in urine: Secondary | ICD-10-CM | POA: Diagnosis not present

## 2020-01-31 DIAGNOSIS — K769 Liver disease, unspecified: Secondary | ICD-10-CM | POA: Diagnosis not present

## 2020-01-31 DIAGNOSIS — N186 End stage renal disease: Secondary | ICD-10-CM | POA: Diagnosis not present

## 2020-01-31 DIAGNOSIS — D631 Anemia in chronic kidney disease: Secondary | ICD-10-CM | POA: Diagnosis not present

## 2020-01-31 DIAGNOSIS — D513 Other dietary vitamin B12 deficiency anemia: Secondary | ICD-10-CM | POA: Diagnosis not present

## 2020-01-31 DIAGNOSIS — Z4932 Encounter for adequacy testing for peritoneal dialysis: Secondary | ICD-10-CM | POA: Diagnosis not present

## 2020-01-31 DIAGNOSIS — D509 Iron deficiency anemia, unspecified: Secondary | ICD-10-CM | POA: Diagnosis not present

## 2020-01-31 DIAGNOSIS — R7309 Other abnormal glucose: Secondary | ICD-10-CM | POA: Diagnosis not present

## 2020-01-31 DIAGNOSIS — N2589 Other disorders resulting from impaired renal tubular function: Secondary | ICD-10-CM | POA: Diagnosis not present

## 2020-01-31 DIAGNOSIS — N2581 Secondary hyperparathyroidism of renal origin: Secondary | ICD-10-CM | POA: Diagnosis not present

## 2020-02-01 DIAGNOSIS — R82998 Other abnormal findings in urine: Secondary | ICD-10-CM | POA: Diagnosis not present

## 2020-02-01 DIAGNOSIS — D509 Iron deficiency anemia, unspecified: Secondary | ICD-10-CM | POA: Diagnosis not present

## 2020-02-01 DIAGNOSIS — R7309 Other abnormal glucose: Secondary | ICD-10-CM | POA: Diagnosis not present

## 2020-02-01 DIAGNOSIS — N2581 Secondary hyperparathyroidism of renal origin: Secondary | ICD-10-CM | POA: Diagnosis not present

## 2020-02-01 DIAGNOSIS — Z4932 Encounter for adequacy testing for peritoneal dialysis: Secondary | ICD-10-CM | POA: Diagnosis not present

## 2020-02-01 DIAGNOSIS — N186 End stage renal disease: Secondary | ICD-10-CM | POA: Diagnosis not present

## 2020-02-01 DIAGNOSIS — D631 Anemia in chronic kidney disease: Secondary | ICD-10-CM | POA: Diagnosis not present

## 2020-02-01 DIAGNOSIS — Z992 Dependence on renal dialysis: Secondary | ICD-10-CM | POA: Diagnosis not present

## 2020-02-01 DIAGNOSIS — D513 Other dietary vitamin B12 deficiency anemia: Secondary | ICD-10-CM | POA: Diagnosis not present

## 2020-02-01 DIAGNOSIS — K769 Liver disease, unspecified: Secondary | ICD-10-CM | POA: Diagnosis not present

## 2020-02-01 DIAGNOSIS — N2589 Other disorders resulting from impaired renal tubular function: Secondary | ICD-10-CM | POA: Diagnosis not present

## 2020-02-02 DIAGNOSIS — R82998 Other abnormal findings in urine: Secondary | ICD-10-CM | POA: Diagnosis not present

## 2020-02-02 DIAGNOSIS — N186 End stage renal disease: Secondary | ICD-10-CM | POA: Diagnosis not present

## 2020-02-02 DIAGNOSIS — D513 Other dietary vitamin B12 deficiency anemia: Secondary | ICD-10-CM | POA: Diagnosis not present

## 2020-02-02 DIAGNOSIS — D631 Anemia in chronic kidney disease: Secondary | ICD-10-CM | POA: Diagnosis not present

## 2020-02-02 DIAGNOSIS — K769 Liver disease, unspecified: Secondary | ICD-10-CM | POA: Diagnosis not present

## 2020-02-02 DIAGNOSIS — N2581 Secondary hyperparathyroidism of renal origin: Secondary | ICD-10-CM | POA: Diagnosis not present

## 2020-02-02 DIAGNOSIS — R7309 Other abnormal glucose: Secondary | ICD-10-CM | POA: Diagnosis not present

## 2020-02-02 DIAGNOSIS — Z992 Dependence on renal dialysis: Secondary | ICD-10-CM | POA: Diagnosis not present

## 2020-02-02 DIAGNOSIS — D509 Iron deficiency anemia, unspecified: Secondary | ICD-10-CM | POA: Diagnosis not present

## 2020-02-02 DIAGNOSIS — N2589 Other disorders resulting from impaired renal tubular function: Secondary | ICD-10-CM | POA: Diagnosis not present

## 2020-02-02 DIAGNOSIS — Z4932 Encounter for adequacy testing for peritoneal dialysis: Secondary | ICD-10-CM | POA: Diagnosis not present

## 2020-02-03 ENCOUNTER — Ambulatory Visit (INDEPENDENT_AMBULATORY_CARE_PROVIDER_SITE_OTHER): Payer: Medicare Other | Admitting: Allergy & Immunology

## 2020-02-03 ENCOUNTER — Encounter: Payer: Self-pay | Admitting: Allergy & Immunology

## 2020-02-03 ENCOUNTER — Other Ambulatory Visit: Payer: Self-pay

## 2020-02-03 VITALS — BP 142/86 | Temp 97.3°F | Resp 18 | Ht 70.0 in | Wt 193.8 lb

## 2020-02-03 DIAGNOSIS — Z992 Dependence on renal dialysis: Secondary | ICD-10-CM | POA: Diagnosis not present

## 2020-02-03 DIAGNOSIS — R21 Rash and other nonspecific skin eruption: Secondary | ICD-10-CM

## 2020-02-03 DIAGNOSIS — H1013 Acute atopic conjunctivitis, bilateral: Secondary | ICD-10-CM

## 2020-02-03 DIAGNOSIS — J3089 Other allergic rhinitis: Secondary | ICD-10-CM | POA: Diagnosis not present

## 2020-02-03 DIAGNOSIS — I129 Hypertensive chronic kidney disease with stage 1 through stage 4 chronic kidney disease, or unspecified chronic kidney disease: Secondary | ICD-10-CM | POA: Diagnosis not present

## 2020-02-03 DIAGNOSIS — H1010 Acute atopic conjunctivitis, unspecified eye: Secondary | ICD-10-CM

## 2020-02-03 DIAGNOSIS — J302 Other seasonal allergic rhinitis: Secondary | ICD-10-CM

## 2020-02-03 DIAGNOSIS — N2581 Secondary hyperparathyroidism of renal origin: Secondary | ICD-10-CM | POA: Diagnosis not present

## 2020-02-03 DIAGNOSIS — Z79899 Other long term (current) drug therapy: Secondary | ICD-10-CM | POA: Diagnosis not present

## 2020-02-03 DIAGNOSIS — D631 Anemia in chronic kidney disease: Secondary | ICD-10-CM | POA: Diagnosis not present

## 2020-02-03 DIAGNOSIS — N186 End stage renal disease: Secondary | ICD-10-CM | POA: Diagnosis not present

## 2020-02-03 DIAGNOSIS — R82998 Other abnormal findings in urine: Secondary | ICD-10-CM | POA: Diagnosis not present

## 2020-02-03 DIAGNOSIS — E440 Moderate protein-calorie malnutrition: Secondary | ICD-10-CM | POA: Diagnosis not present

## 2020-02-03 DIAGNOSIS — D509 Iron deficiency anemia, unspecified: Secondary | ICD-10-CM | POA: Diagnosis not present

## 2020-02-03 MED ORDER — PIMECROLIMUS 1 % EX CREA: TOPICAL_CREAM | Freq: Two times a day (BID) | CUTANEOUS | 2 refills | Status: DC

## 2020-02-03 NOTE — Progress Notes (Signed)
NEW PATIENT  Date of Service/Encounter:  02/03/20  Referring provider: [name], MD   Assessment:   Perennial and seasonal allergic rhinitis (grasses, ragweed, weeds, trees, indoor molds, outdoor molds, dust mites, cat and dog  Seasonal allergic conjunctivitis   End stage kidney disease - on peritoneal dialysis  Plan/Recommendations:   1. Seasonal and perennial allergic rhinitis - Testing today showed: grasses, ragweed, weeds, trees, indoor molds, outdoor molds, dust mites, cat and dog  - Copy of test results provided.  - Avoidance measures provided. - Continue with: Flonase (fluticasone) one spray per nostril daily - Start taking: Elidel ointment twice daily around your eyes (also safe to use on the face), Claritin (loratadine) 10mg  tablet once daily and Pataday (olopatadine) one drop per eye twice daily as needed - You can use an extra dose of the antihistamine, if needed, for breakthrough symptoms.  - Consider nasal saline rinses 1-2 times daily to remove allergens from the nasal cavities as well as help with mucous clearance (this is especially helpful to do before the nasal sprays are given) - Consider allergy shots as a means of long-term control. - Allergy shots "re-train" and "reset" the immune system to ignore environmental allergens and decrease the resulting immune response to those allergens (sneezing, itchy watery eyes, runny nose, nasal congestion, etc).    - Allergy shots improve symptoms in 75-85% of patients.  - We can discuss more at the next appointment if the medications are not working for you.  2. Return in about 2 months (around 04/02/2020).   Subjective:   Vernon Blackburn is a 55 y.o. male presenting today for evaluation of  Chief Complaint  Patient presents with  . Allergic Rhinitis     Usually has seasonal allergy symptoms around May-April. He is here today with watery itchy eyes and states face itches. Has used benadryl and eyedrops both  has not helped.   Off all antihistamines   . Asthma    Has inhalers for prn use but has not had any issues since his micro valve surgery in 2018    Vernon Blackburn has a history of the following: Patient Active Problem List   Diagnosis Date Noted  . Abdominal wall cellulitis 01/27/2020  . ESRD on dialysis ([place]) 04/22/2019  . Long term (current) use of anticoagulants [Z79.01] 07/17/2016  . Syncope and collapse 07/14/2016  . Chest pain 07/13/2016  . Other chest pain   . Coagulopathy ([place])   . S/P minimally invasive mitral valve repair 06/29/2016  . CHF (congestive heart failure) ([place]) 06/06/2016  . Hypertensive heart and chronic kidney disease with heart failure and stage 1 through stage 4 chronic kidney disease, or chronic kidney disease ([place]) 02/27/2016  . Chronic systolic HF (heart failure) ([place])   . Cardiomyopathy ([place])   . Pulmonary HTN ([place])   . DOE (dyspnea on exertion) 01/02/2016  . Chronic kidney disease (CKD), stage IV (severe) ([place])   . Anemia   . Edema 11/15/2011    History obtained from: chart review and patient.  [name] was referred by [name], MD.     Vernon Blackburn is a 55 y.o. male presenting for an evaluation of allergies.  He is getting worked up for a kidney transplant. He went on dialysis one year ago from high blood pressure. He is now listed for a transplant.     Asthma/Respiratory Symptom History: He did have asthma in the past. He had symptoms that improved after he had  mitral valve surgery in 2018. He has an albuterol inhaler to use as needed.   Allergic Rhinitis Symptom History: He typically has allergy symptoms in April/May. But now he is having issues in the middle of the winter. He went to see his PCP and eye drops were not helpful. Benadrlyl has not helped at all either. Two weeks ago, it has been relentless. He has has had "facial fire" and especially on his eye lids. He wakes up every morning with crust on his eyes. He is very relentless  about keeping clean at home since he does peritoneal dialysis.   His wife did start using some sprays from [employer] recently. He thinks that this might have started with that. They ended up throwing it out. The last time that it was used was last week. He has cleaned his laundry since that time. They have dye free and fragrance free laundry detergent.   In April/May, he would have symptoms with the pollen exposure. He reports that he had a combination of eye burning and rhinorrhea. He does not use antihistamines for this at all on a regular basis. He does PD seven days per week. He was tested when he was in his 63s.  He has a rash on his bilateral hands. He attributes this to excessive hand cleaning. He is seeing Dr. Juleen China for evaluation of this as well as cellulitis. He was actually given dalbavancin which apparently is a one time administration. He has prednisone which was started today for the rash on his hands. This is going to be for one week.   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  Otherwise, there is no history of other atopic diseases, including food allergies, drug allergies, stinging insect allergies or contact dermatitis. There is no significant infectious history. Vaccinations are up to date.    Past Medical History: Patient Active Problem List   Diagnosis Date Noted  . Abdominal wall cellulitis 01/27/2020  . ESRD on dialysis ([place]) 04/22/2019  . Long term (current) use of anticoagulants [Z79.01] 07/17/2016  . Syncope and collapse 07/14/2016  . Chest pain 07/13/2016  . Other chest pain   . Coagulopathy ([place])   . S/P minimally invasive mitral valve repair 06/29/2016  . CHF (congestive heart failure) ([place]) 06/06/2016  . Hypertensive heart and chronic kidney disease with heart failure and stage 1 through stage 4 chronic kidney disease, or chronic kidney disease ([place])  02/27/2016  . Chronic systolic HF (heart failure) ([place])   . Cardiomyopathy ([place])   . Pulmonary HTN ([place])   . DOE (dyspnea on exertion) 01/02/2016  . Chronic kidney disease (CKD), stage IV (severe) ([place])   . Anemia   . Edema 11/15/2011    Medication List:  Allergies as of 02/03/2020      Reactions   Isosorbide    Hypotension       Medication List       Accurate as of February 03, 2020  3:31 PM. If you have any questions, ask your nurse or doctor.        albuterol 108 (90 Base) MCG/ACT inhaler Commonly known as: VENTOLIN HFA Inhale 2 puffs into the lungs every 6 (six) hours as needed for wheezing or shortness of breath.   PROAIR RESPICLICK IN 2 puffs daily as needed.   aspirin EC 81 MG tablet Take 1 tablet (81 mg total)  by mouth daily. What changed: when to take this   atorvastatin 10 MG tablet Commonly known as: LIPITOR Take 10 mg by mouth daily.   azelastine 0.05 % ophthalmic solution Commonly known as: OPTIVAR   betamethasone dipropionate 0.05 % cream Apply thin layer to affected site twice daily as needed for itch   bismuth subsalicylate 700 FV/49SW suspension Commonly known as: PEPTO BISMOL Take 30 mLs by mouth every 6 (six) hours as needed for indigestion.   calcitRIOL 0.25 MCG capsule Commonly known as: ROCALTROL Take by mouth.   calcium acetate 667 MG capsule Commonly known as: PHOSLO Take 2,668 mg by mouth 3 (three) times daily.   carvedilol 12.5 MG tablet Commonly known as: COREG Take 12.5 mg by mouth in the morning and at bedtime.   febuxostat 40 MG tablet Commonly known as: ULORIC Take 40 mg by mouth daily.   Febuxostat 80 MG Tabs Take 1 tablet by mouth daily.   fluticasone 50 MCG/ACT nasal spray Commonly known as: FLONASE Place 1 spray into both nostrils daily as needed for allergies or rhinitis.   furosemide 80 MG tablet Commonly known as: LASIX Take 80 mg by mouth in the morning and at bedtime.   gentamicin cream 0.1 % Commonly  known as: GARAMYCIN APPLY DAILY TO PD (PERITONEAL DIALYSIS) SITE AFTER SITE CLEANING   pimecrolimus 1 % cream Commonly known as: Elidel Apply topically 2 (two) times daily. Started by: Valentina Shaggy, MD   predniSONE 10 MG tablet Commonly known as: DELTASONE Take by mouth.   sevelamer carbonate 800 MG tablet Commonly known as: RENVELA Take 800 mg by mouth 3 (three) times daily with meals.   sildenafil 20 MG tablet Commonly known as: REVATIO Take by mouth daily as needed.   tamsulosin 0.4 MG Caps capsule Commonly known as: FLOMAX Take 0.4 mg by mouth in the morning and at bedtime. Take 1 tab bid   Vyvanse 30 MG capsule Generic drug: lisdexamfetamine Take 30 mg by mouth every morning.       Birth History: non-contributory    Past Surgical History: Past Surgical History:  Procedure Laterality Date  . AV FISTULA PLACEMENT Left 03/10/2019   Procedure: LEFT ARM BASILIC ARTERIOVENOUS (AV) FISTULA CREATION FIRST STAGE;  Surgeon: Marty Heck, MD;  Location: [facility];  Service: Vascular;  Laterality: Left;  . BASCILIC VEIN TRANSPOSITION Left 05/01/2019   Procedure: LEFT SECOND STAGE BASCILIC VEIN TRANSPOSITION.;  Surgeon: Marty Heck, MD;  Location: [facility];  Service: Vascular;  Laterality: Left;  . CYSTOSCOPY/URETEROSCOPY/HOLMIUM LASER/STENT PLACEMENT Right 07/17/2018   Procedure: CYSTOSCOPY/RETROGRADE/URETEROSCOPY/HOLMIUM LASER/STENT PLACEMENT/ CYSTOLITHALOPAXY;  Surgeon: Ceasar Mons, MD;  Location: [facility];  Service: Urology;  Laterality: Right;  . GASTRIC BYPASS  2014  . HERNIA REPAIR    . MENISCUS REPAIR Right 05/2008   right  knee  . MITRAL VALVE REPAIR Right 06/29/2016   Procedure: MINIMALLY INVASIVE MITRAL VALVE REPAIR  (MVR);  Surgeon: [name], MD;  Location: [facility];  Service: Open Heart Surgery;  Laterality: Right;  . RIGHT/LEFT HEART CATH AND CORONARY ANGIOGRAPHY N/A 06/07/2016   Procedure: Right/Left Heart Cath and Coronary  Angiography;  Surgeon: Larey Dresser, MD;  Location: [facility];  Service: Cardiovascular;  Laterality: N/A;  . TEE WITHOUT CARDIOVERSION N/A 05/04/2016   Procedure: TRANSESOPHAGEAL ECHOCARDIOGRAM (TEE);  Surgeon: Larey Dresser, MD;  Location: [facility];  Service: Cardiovascular;  Laterality: N/A;  . TEE WITHOUT CARDIOVERSION N/A 06/29/2016   Procedure: TRANSESOPHAGEAL ECHOCARDIOGRAM (TEE);  Surgeon: [name], MD;  Location: [facility];  Service: Open Heart Surgery;  Laterality: N/A;  . TEE WITHOUT CARDIOVERSION N/A 03/06/2019   Procedure: TRANSESOPHAGEAL ECHOCARDIOGRAM (TEE);  Surgeon: Larey Dresser, MD;  Location: [facility]COPY;  Service: Cardiovascular;  Laterality: N/A;     Family History: Family History  Problem Relation Age of Onset  . Diabetes Mother   . Hypertension Mother   . Heart failure Mother   . Colon cancer Neg Hx   . Esophageal cancer Neg Hx   . Rectal cancer Neg Hx   . Stomach cancer Neg Hx      Social History: Abdur lives at home with his family.  They live in a house with hardwood throughout the home.  There is electric heating and central cooling.  There are no animals inside or outside of the home.  There are no dust mite covers on the bedding.  There is no tobacco exposure.  He is currently on disability due to his chronic kidney disease.  He does not use a HEPA filter in the home.  He does not live near an interstate or industrial area.   Review of Systems  Constitutional: Negative.  Negative for chills, fever, malaise/fatigue and weight loss.  HENT: Positive for congestion and sinus pain. Negative for ear discharge and ear pain.   Eyes: Positive for pain and redness. Negative for discharge.  Respiratory: Negative for cough, sputum production, shortness of breath and wheezing.   Cardiovascular: Negative.  Negative for chest pain and palpitations.  Gastrointestinal: Negative for abdominal pain, constipation, diarrhea, heartburn, nausea and  vomiting.  Skin: Negative.  Negative for itching and rash.  Neurological: Negative for dizziness and headaches.  Endo/Heme/Allergies: Positive for environmental allergies. Does not bruise/bleed easily.       Objective:   Blood pressure (!) 142/86, temperature (!) 97.3 F (36.3 C), resp. rate 18, height 5\' 10"  (1.778 m), weight 193 lb 12.8 oz (87.9 kg). Body mass index is 27.81 kg/m.   Physical Exam:   Physical Exam Constitutional:      Appearance: He is well-developed.     Comments: Pleasant male.   HENT:     Head: Normocephalic and atraumatic.     Right Ear: Tympanic membrane, ear canal and external ear normal. No drainage, swelling or tenderness. Tympanic membrane is not injected, scarred, erythematous, retracted or bulging.     Left Ear: Tympanic membrane, ear canal and external ear normal. No drainage, swelling or tenderness. Tympanic membrane is not injected, scarred, erythematous, retracted or bulging.     Nose: No nasal deformity, septal deviation, mucosal edema, rhinorrhea or epistaxis.     Right Turbinates: Enlarged and swollen.     Left Turbinates: Enlarged and swollen.     Right Sinus: No maxillary sinus tenderness or frontal sinus tenderness.     Left Sinus: No maxillary sinus tenderness or frontal sinus tenderness.     Mouth/Throat:     Mouth: Oropharynx is clear and moist. Mucous membranes are not pale and not dry.     Pharynx: Uvula midline.  Eyes:     General: Allergic shiner present.        Right eye: No discharge.        Left eye: No discharge.     Extraocular Movements: EOM normal.     Conjunctiva/sclera: Conjunctivae normal.     Right eye: Right conjunctiva is not injected. No chemosis.    Left eye: Left conjunctiva is not injected. No chemosis.  Pupils: Pupils are equal, round, and reactive to light.     Comments: Thickened somewhat macerated skin around the bilateral eyes.  Cardiovascular:     Rate and Rhythm: Normal rate and regular rhythm.      Heart sounds: Normal heart sounds.  Pulmonary:     Effort: Pulmonary effort is normal. No tachypnea, accessory muscle usage or respiratory distress.     Breath sounds: Normal breath sounds. No wheezing, rhonchi or rales.  Chest:     Chest wall: No tenderness.  Abdominal:     Tenderness: There is no abdominal tenderness. There is no guarding or rebound.  Lymphadenopathy:     Head:     Right side of head: No submandibular, tonsillar or occipital adenopathy.     Left side of head: No submandibular, tonsillar or occipital adenopathy.     Cervical: No cervical adenopathy.  Skin:    General: Skin is warm.     Capillary Refill: Capillary refill takes less than 2 seconds.     Coloration: Skin is not pale.     Findings: No abrasion, erythema, petechiae or rash. Rash is not papular, urticarial or vesicular.     Comments: No eczematous or urticarial lesions. He does have thickened skin on his bilateral hands on the extensor surfaces.  Neurological:     Mental Status: He is alert.  Psychiatric:        Mood and Affect: Mood and affect normal.        Behavior: Behavior is cooperative.      Diagnostic studies:     Allergy Studies:     Airborne Adult Perc - 02/03/20 1406    Time Antigen Placed 1406    Allergen Manufacturer Lavella Hammock    Location Back    Number of Test 59    Panel 1 Select    1. Control-Buffer 50% Glycerol Negative    2. Control-Histamine 1 mg/ml 2+    3. Albumin saline Negative    4. [place] 3+    5. [place] 3+    6. [place] 4+    7. [place] Blue 2+    8. Meadow Fescue 2+    9. Perennial Rye 2+    10. Sweet Vernal 2+    11. Timothy Negative    12. Cocklebur Negative    13. Burweed Marshelder Negative    14. Ragweed, short Negative    15. Ragweed, Giant Negative    16. Plantain,  English Negative    17. Lamb's Quarters Negative    18. Sheep Sorrell 2+    19. Rough Pigweed Negative    20. Marsh Elder, Rough 2+    21. Mugwort, Common 2+    22. Ash mix 2+    23.  Birch mix 2+    24. Beech American 2+    25. Box, Elder 3+    26. Cedar, red 2+    27. Cottonwood, [place] Negative    28. Elm mix 2+    29. Hickory Negative    30. Maple mix 2+    31. Oak, [place] mix Negative    32. Pecan Pollen Negative    33. Pine mix Negative    34. Sycamore Eastern Negative    35. Amanda, Black Pollen Negative    36. Alternaria alternata Negative    37. Cladosporium Herbarum Negative    38. Aspergillus mix Negative    39. Penicillium mix Negative    40. Bipolaris sorokiniana (Helminthosporium) Negative    41.  Drechslera spicifera (Curvularia) Negative    42. Mucor plumbeus Negative    43. Fusarium moniliforme Negative    44. Aureobasidium pullulans (pullulara) Negative    45. Rhizopus oryzae Negative    46. Botrytis cinera Negative    47. Epicoccum nigrum Negative    48. Phoma betae Negative    49. Candida Albicans Negative    50. Trichophyton mentagrophytes Negative    51. Mite, D Farinae  5,000 AU/ml 2+    52. Mite, D Pteronyssinus  5,000 AU/ml 2+    53. Cat Hair 10,000 BAU/ml 2+    54.  Dog Epithelia 3+    55. Mixed Feathers Negative    56. Horse Epithelia Negative    57. Cockroach, German Negative    58. Mouse Negative    59. Tobacco Leaf Negative          Intradermal - 02/03/20 1450    Time Antigen Placed 1450    Allergen Manufacturer Lavella Hammock    Location Arm    Number of Test 7    Intradermal Select    Control Negative    Ragweed mix 1+    Mold 1 1+    Mold 2 Negative    Mold 3 Negative    Mold 4 1+    Cockroach Negative           Allergy testing results were read and interpreted by myself, documented by clinical staff.         Salvatore Marvel, MD Allergy and [place] [facility] [place]

## 2020-02-03 NOTE — Patient Instructions (Addendum)
1. Seasonal and perennial allergic rhinitis - Testing today showed: grasses, ragweed, weeds, trees, indoor molds, outdoor molds, dust mites, cat and dog  - Copy of test results provided.  - Avoidance measures provided. - Stop taking:  - Continue with: Flonase (fluticasone) one spray per nostril daily - Start taking: Elidel ointment twice daily around your eyes (also safe to use on the face), Claritin (loratadine) 10mg  tablet once daily and Pataday (olopatadine) one drop per eye twice daily as needed - You can use an extra dose of the antihistamine, if needed, for breakthrough symptoms.  - Consider nasal saline rinses 1-2 times daily to remove allergens from the nasal cavities as well as help with mucous clearance (this is especially helpful to do before the nasal sprays are given) - Consider allergy shots as a means of long-term control. - Allergy shots "re-train" and "reset" the immune system to ignore environmental allergens and decrease the resulting immune response to those allergens (sneezing, itchy watery eyes, runny nose, nasal congestion, etc).    - Allergy shots improve symptoms in 75-85% of patients.  - We can discuss more at the next appointment if the medications are not working for you.  2. Return in about 2 months (around 04/02/2020).    Please inform us of any Emergency Department visits, hospitalizations, or changes in symptoms. Call us before going to the ED for breathing or allergy symptoms since we might be able to fit you in for a sick visit. Feel free to contact us anytime with any questions, problems, or concerns.  It was a pleasure to meet you today!  Websites that have reliable patient information: 1. American Academy of Asthma, Allergy, and Immunology: www.aaaai.org 2. Food Allergy Research and Education (FARE): foodallergy.org 3. Mothers of Asthmatics: http://www.asthmacommunitynetwork.org 4. American College of Allergy, Asthma, and Immunology:  www.acaai.org   COVID-19 Vaccine Information can be found at: [email or web address].co.uk For questions related to vaccine distribution or appointments, please email [email or web address] or call [phone].     "Like" [place] on Facebook and Instagram for our latest updates!       Make sure you are registered to vote! If you have moved or changed any of your contact information, you will need to get this updated before voting!  In some cases, you MAY be able to register to vote online: [email or web address]    Reducing Pollen Exposure  The American Academy of Allergy, Asthma and Immunology suggests the following steps to reduce your exposure to pollen during allergy seasons.    1. Do not hang sheets or clothing out to dry; pollen may collect on these items. 2. Do not mow lawns or spend time around freshly cut grass; mowing stirs up pollen. 3. Keep windows closed at night.  Keep car windows closed while driving. 4. Minimize morning activities outdoors, a time when pollen counts are usually at their highest. 5. Stay indoors as much as possible when pollen counts or humidity is high and on windy days when pollen tends to remain in the air longer. 6. Use air conditioning when possible.  Many air conditioners have filters that trap the pollen spores. 7. Use a HEPA room air filter to remove pollen form the indoor air you breathe.  Control of Mold Allergen   Mold and fungi can grow on a variety of surfaces provided certain temperature and moisture conditions exist.  Outdoor molds grow on plants, decaying vegetation and soil.  The major outdoor mold, Alternaria and Cladosporium, are found in very high numbers during hot  and dry conditions.  Generally, a late Summer - Fall peak is seen for common outdoor fungal spores.  Rain will temporarily lower outdoor mold spore count, but counts rise rapidly when the rainy period  ends.  The most important indoor molds are Aspergillus and Penicillium.  Dark, humid and poorly ventilated basements are ideal sites for mold growth.  The next most common sites of mold growth are the bathroom and the kitchen.  Outdoor (Seasonal) Mold Control  Positive outdoor molds via skin testing: Alternaria and Cladosporium  1. Use air conditioning and keep windows closed 2. Avoid exposure to decaying vegetation. 3. Avoid leaf raking. 4. Avoid grain handling. 5. Consider wearing a face mask if working in moldy areas.  6.   Indoor (Perennial) Mold Control   Positive indoor molds via skin testing: Fusarium, Aureobasidium (Pullulara) and Rhizopus  1. Maintain humidity below 50%. 2. Clean washable surfaces with 5% bleach solution. 3. Remove sources e.g. contaminated carpets.     Control of Dust Mite Allergen    Dust mites play a major role in allergic asthma and rhinitis.  They occur in environments with high humidity wherever human skin is found.  Dust mites absorb humidity from the atmosphere (ie, they do not drink) and feed on organic matter (including shed human and animal skin).  Dust mites are a microscopic type of insect that you cannot see with the naked eye.  High levels of dust mites have been detected from mattresses, pillows, carpets, upholstered furniture, bed covers, clothes, soft toys and any woven material.  The principal allergen of the dust mite is found in its feces.  A gram of dust may contain 1,000 mites and 250,000 fecal particles.  Mite antigen is easily measured in the air during house cleaning activities.  Dust mites do not bite and do not cause harm to humans, other than by triggering allergies/asthma.    Ways to decrease your exposure to dust mites in your home:  1. Encase mattresses, box springs and pillows with a mite-impermeable barrier or cover   2. Wash sheets, blankets and drapes weekly in hot water (130 F) with detergent and dry them in a dryer on  the hot setting.  3. Have the room cleaned frequently with a vacuum cleaner and a damp dust-mop.  For carpeting or rugs, vacuuming with a vacuum cleaner equipped with a high-efficiency particulate air (HEPA) filter.  The dust mite allergic individual should not be in a room which is being cleaned and should wait 1 hour after cleaning before going into the room. 4. Do not sleep on upholstered furniture (eg, couches).   5. If possible removing carpeting, upholstered furniture and drapery from the home is ideal.  Horizontal blinds should be eliminated in the rooms where the person spends the most time (bedroom, study, television room).  Washable vinyl, roller-type shades are optimal. 6. Remove all non-washable stuffed toys from the bedroom.  Wash stuffed toys weekly like sheets and blankets above.   7. Reduce indoor humidity to less than 50%.  Inexpensive humidity monitors can be purchased at most hardware stores.  Do not use a humidifier as can make the problem worse and are not recommended.  Control of Cockroach Allergen  Cockroach allergen has been identified as an important cause of acute attacks of asthma, especially in urban settings.  There are fifty-five species of cockroach that exist in the [place], however only three, the [place], [place] species produce allergen that can affect patients with  Asthma.  Allergens can be obtained from fecal particles, egg casings and secretions from cockroaches.    1. Remove food sources. 2. Reduce access to water. 3. Seal access and entry points. 4. Spray runways with 0.5-1% Diazinon or Chlorpyrifos 5. Blow boric acid power under stoves and refrigerator. 6. Place bait stations (hydramethylnon) at feeding sites.  Allergy Shots   Allergies are the result of a chain reaction that starts in the immune system. Your immune system controls how your body defends itself. For instance, if you have an allergy to pollen, your immune system identifies  pollen as an invader or allergen. Your immune system overreacts by producing antibodies called Immunoglobulin E (IgE). These antibodies travel to cells that release chemicals, causing an allergic reaction.  The concept behind allergy immunotherapy, whether it is received in the form of shots or tablets, is that the immune system can be desensitized to specific allergens that trigger allergy symptoms. Although it requires time and patience, the payback can be long-term relief.  How Do Allergy Shots Work?  Allergy shots work much like a vaccine. Your body responds to injected amounts of a particular allergen given in increasing doses, eventually developing a resistance and tolerance to it. Allergy shots can lead to decreased, minimal or no allergy symptoms.  There generally are two phases: build-up and maintenance. Build-up often ranges from three to six months and involves receiving injections with increasing amounts of the allergens. The shots are typically given once or twice a week, though more rapid build-up schedules are sometimes used.  The maintenance phase begins when the most effective dose is reached. This dose is different for each person, depending on how allergic you are and your response to the build-up injections. Once the maintenance dose is reached, there are longer periods between injections, typically two to four weeks.  Occasionally doctors give cortisone-type shots that can temporarily reduce allergy symptoms. These types of shots are different and should not be confused with allergy immunotherapy shots.  Who Can Be Treated with Allergy Shots?  Allergy shots may be a good treatment approach for people with allergic rhinitis (hay fever), allergic asthma, conjunctivitis (eye allergy) or stinging insect allergy.   Before deciding to begin allergy shots, you should consider:  . The length of allergy season and the severity of your symptoms . Whether medications and/or changes to  your environment can control your symptoms . Your desire to avoid long-term medication use . Time: allergy immunotherapy requires a major time commitment . Cost: may vary depending on your insurance coverage  Allergy shots for children age 35 and older are effective and often well tolerated. They might prevent the onset of new allergen sensitivities or the progression to asthma.  Allergy shots are not started on patients who are pregnant but can be continued on patients who become pregnant while receiving them. In some patients with other medical conditions or who take certain common medications, allergy shots may be of risk. It is important to mention other medications you talk to your allergist.   When Will I Feel Better?  Some may experience decreased allergy symptoms during the build-up phase. For others, it may take as long as 12 months on the maintenance dose. If there is no improvement after a year of maintenance, your allergist will discuss other treatment options with you.  If you aren't responding to allergy shots, it may be because there is not enough dose of the allergen in your vaccine or there are missing allergens that  were not identified during your allergy testing. Other reasons could be that there are high levels of the allergen in your environment or major exposure to non-allergic triggers like tobacco smoke.  What Is the Length of Treatment?  Once the maintenance dose is reached, allergy shots are generally continued for three to five years. The decision to stop should be discussed with your allergist at that time. Some people may experience a permanent reduction of allergy symptoms. Others may relapse and a longer course of allergy shots can be considered.  What Are the Possible Reactions?  The two types of adverse reactions that can occur with allergy shots are local and systemic. Common local reactions include very mild redness and swelling at the injection site, which  can happen immediately or several hours after. A systemic reaction, which is less common, affects the entire body or a particular body system. They are usually mild and typically respond quickly to medications. Signs include increased allergy symptoms such as sneezing, a stuffy nose or hives.  Rarely, a serious systemic reaction called anaphylaxis can develop. Symptoms include swelling in the throat, wheezing, a feeling of tightness in the chest, nausea or dizziness. Most serious systemic reactions develop within 30 minutes of allergy shots. This is why it is strongly recommended you wait in your doctor's office for 30 minutes after your injections. Your allergist is trained to watch for reactions, and his or her staff is trained and equipped with the proper medications to identify and treat them.  Who Should Administer Allergy Shots?  The preferred location for receiving shots is your prescribing allergist's office. Injections can sometimes be given at another facility where the physician and staff are trained to recognize and treat reactions, and have received instructions by your prescribing allergist.

## 2020-02-04 ENCOUNTER — Encounter: Payer: Self-pay | Admitting: Internal Medicine

## 2020-02-04 ENCOUNTER — Ambulatory Visit (INDEPENDENT_AMBULATORY_CARE_PROVIDER_SITE_OTHER): Payer: Medicare Other | Admitting: Internal Medicine

## 2020-02-04 VITALS — BP 155/114 | HR 88 | Temp 98.0°F | Ht 70.0 in | Wt 196.0 lb

## 2020-02-04 DIAGNOSIS — Z79899 Other long term (current) drug therapy: Secondary | ICD-10-CM | POA: Diagnosis not present

## 2020-02-04 DIAGNOSIS — N2581 Secondary hyperparathyroidism of renal origin: Secondary | ICD-10-CM | POA: Diagnosis not present

## 2020-02-04 DIAGNOSIS — N186 End stage renal disease: Secondary | ICD-10-CM | POA: Diagnosis not present

## 2020-02-04 DIAGNOSIS — L03311 Cellulitis of abdominal wall: Secondary | ICD-10-CM

## 2020-02-04 DIAGNOSIS — Z992 Dependence on renal dialysis: Secondary | ICD-10-CM | POA: Diagnosis not present

## 2020-02-04 DIAGNOSIS — E440 Moderate protein-calorie malnutrition: Secondary | ICD-10-CM | POA: Diagnosis not present

## 2020-02-04 DIAGNOSIS — D631 Anemia in chronic kidney disease: Secondary | ICD-10-CM | POA: Diagnosis not present

## 2020-02-04 DIAGNOSIS — D509 Iron deficiency anemia, unspecified: Secondary | ICD-10-CM | POA: Diagnosis not present

## 2020-02-04 DIAGNOSIS — R82998 Other abnormal findings in urine: Secondary | ICD-10-CM | POA: Diagnosis not present

## 2020-02-04 NOTE — Patient Instructions (Signed)
Thank you for coming to see me today. It was a pleasure seeing you.  To Do: Marland Kitchen Continue your allergy medications as prescribed . Your stomach looks a lot better and I do not think further antibiotics are needed . Please call us if you have any new pain, redness, or concern for recurrence of your infection  If you have any questions or concerns, please do not hesitate to call the office at [phone].  Take Care,   Jule Ser, DO

## 2020-02-04 NOTE — Progress Notes (Signed)
[facility] for Infectious Disease  Reason for Consult: Cellulitis  Referring Provider: ED provider   HPI:    Vernon Blackburn is a 55 y.o. male who presents to clinic for further evaluation of cellulitis of the abdominal wall.  Last seen by me on 01/27/20 for ED visit follow up.  Pain and swelling in abdomen has gone down a lot.  Does still have some mild pain around belly button.  Having tremendous allergies, congestion and rash on hands.  Started on prednisone taper by his allergy doctor yesterday.  Otherwise tolerating PD okay.  He is hoping for transplant through [facility].     Patient's Medications  New Prescriptions   No medications on file  Previous Medications   ALBUTEROL (PROVENTIL HFA;VENTOLIN HFA) 108 (90 BASE) MCG/ACT INHALER    Inhale 2 puffs into the lungs every 6 (six) hours as needed for wheezing or shortness of breath.   ALBUTEROL SULFATE (PROAIR RESPICLICK IN)    2 puffs daily as needed.   ASPIRIN EC 81 MG TABLET    Take 1 tablet (81 mg total) by mouth daily.   ATORVASTATIN (LIPITOR) 10 MG TABLET    Take 10 mg by mouth daily.   AZELASTINE (OPTIVAR) 0.05 % OPHTHALMIC SOLUTION       B COMPLEX-C-ZN-FOLIC ACID (DIALYVITE 924 WITH ZINC) 0.8 MG TABS    TAKE 1 TABLET BY MOUTH EVERY DAY  WITH THE EVENING MEAL   BETAMETHASONE DIPROPIONATE 0.05 % CREAM    Apply thin layer to affected site twice daily as needed for itch   BISMUTH SUBSALICYLATE (PEPTO BISMOL) 262 MG/15ML SUSPENSION    Take 30 mLs by mouth every 6 (six) hours as needed for indigestion.   CALCITRIOL (ROCALTROL) 0.25 MCG CAPSULE    Take by mouth.   CALCIUM ACETATE (PHOSLO) 667 MG CAPSULE    Take 2,668 mg by mouth 3 (three) times daily.   CARVEDILOL (COREG) 12.5 MG TABLET    Take 12.5 mg by mouth in the morning and at bedtime.   CEFDINIR (OMNICEF) 300 MG CAPSULE    Take by mouth.   FEBUXOSTAT (ULORIC) 40 MG TABLET    Take 40 mg by mouth daily.    FEBUXOSTAT 80 MG TABS    Take 1 tablet by mouth daily.    FLUTICASONE (FLONASE) 50 MCG/ACT NASAL SPRAY    Place 1 spray into both nostrils daily as needed for allergies or rhinitis.    FUROSEMIDE (LASIX) 80 MG TABLET    Take 80 mg by mouth in the morning and at bedtime.   GENTAMICIN CREAM (GARAMYCIN) 0.1 %    APPLY DAILY TO PD (PERITONEAL DIALYSIS) SITE AFTER SITE CLEANING   PIMECROLIMUS (ELIDEL) 1 % CREAM    Apply topically 2 (two) times daily.   PREDNISONE (DELTASONE) 10 MG TABLET    Take by mouth.   SEVELAMER CARBONATE (RENVELA) 800 MG TABLET    Take 800 mg by mouth 3 (three) times daily with meals.   SILDENAFIL (REVATIO) 20 MG TABLET    Take by mouth daily as needed.   TAMSULOSIN (FLOMAX) 0.4 MG CAPS CAPSULE    Take 0.4 mg by mouth in the morning and at bedtime. Take 1 tab bid   VYVANSE 30 MG CAPSULE    Take 30 mg by mouth every morning.  Modified Medications   No medications on file  Discontinued Medications   No medications on file      Past Medical History:  Diagnosis  Date  . ADHD   . Allergy   . Anemia, chronic disease   . Arthritis    Gout  . Asthma   . Cardiomyopathy [facility])    cardiologist is [facility]? [name], lov in office per patient was last year in 2019   . CHF (congestive heart failure) ([place])    a. 01/2016: echo showing EF of 20-25%, no WMA, severe MR, and PA Peak Pressure of 52 mm Hg.   Marland Kitchen Chronic systolic HF (heart failure) ([place])   . CKD (chronic kidney disease)    Dr [name] ,  stage 4 ; per patient lov  in office march 2020 , per patient at this last visit he was not approved as a candidate for kidney transplant because his kidney fx has improved to greater  than 20  . Elbow pain, left   . Heart murmur   . History of kidney stones   . Hypertension   . Knee pain, bilateral   . Left ankle pain 11/04/2010   Gout  . Leg pain, bilateral   . Peripheral edema    Bilateral lower extremity   . Pneumonia    hx  . Pulmonary HTN ([place])   . S/P minimally invasive mitral valve repair 06/29/2016   30 mm Sorin Memo 3D ring  annuloplasty via right mini thoracotomy approach  . Severe mitral regurgitation   . Sleep apnea    hx bariatric  surgery 3 yrs ago lost 166 lbs  . Syncope and collapse 07/14/2016  . Venous insufficiency     Social History   Tobacco Use  . Smoking status: Never Smoker  . Smokeless tobacco: Never Used  Vaping Use  . Vaping Use: Never used  Substance Use Topics  . Alcohol use: Not Currently  . Drug use: No    Family History  Problem Relation Age of Onset  . Diabetes Mother   . Hypertension Mother   . Heart failure Mother   . Colon cancer Neg Hx   . Esophageal cancer Neg Hx   . Rectal cancer Neg Hx   . Stomach cancer Neg Hx     Allergies  Allergen Reactions  . Isosorbide     Hypotension     Review of Systems  Constitutional: Negative for chills and fever.  Respiratory: Negative.   Cardiovascular: Negative.   Gastrointestinal: Positive for abdominal pain (improved). Negative for nausea and vomiting.  Skin: Positive for rash.      OBJECTIVE:    Vitals:   02/04/20 0950  BP: (!) 155/114  Pulse: 88  Temp: 98 F (36.7 C)  TempSrc: Oral  SpO2: 98%  Weight: 196 lb (88.9 kg)  Height: 5\' 10"  (1.778 m)     Body mass index is 28.12 kg/m.  Physical Exam Constitutional:      General: He is not in acute distress.    Appearance: Normal appearance.  HENT:     Head: Normocephalic and atraumatic.  Pulmonary:     Effort: Pulmonary effort is normal. No respiratory distress.  Abdominal:     General: There is no distension.     Palpations: Abdomen is soft.     Tenderness: There is no abdominal tenderness. There is no guarding or rebound.     Comments: Abdomen soft and non-tender.  Less swelling with wrinkling of skin/contraction noted.  No warmth or erythema.    Skin:    General: Skin is warm and dry.     Findings: Rash present.  Neurological:  Mental Status: He is alert.      Labs and Microbiology:  CBC Latest Ref Rng & Units 01/22/2020 05/01/2019 03/10/2019   WBC 4.0 - 10.5 K/uL 8.0 - -  Hemoglobin 13.0 - 17.0 g/dL 11.0(L) 15.0 9.9(L)  Hematocrit 39.0 - 52.0 % 33.2(L) 44.0 29.0(L)  Platelets 150 - 400 K/uL 224 - -   CMP Latest Ref Rng & Units 01/22/2020 05/01/2019 03/10/2019  Glucose 70 - 99 mg/dL 93 83 83  BUN 6 - 20 mg/dL 63(H) 56(H) 69(H)  Creatinine 0.61 - 1.24 mg/dL 14.61(H) 10.10(H) 13.10(H)  Sodium 135 - 145 mmol/L 141 138 140  Potassium 3.5 - 5.1 mmol/L 3.8 4.3 4.0  Chloride 98 - 111 mmol/L 100 99 105  CO2 22 - 32 mmol/L 27 - -  Calcium 8.9 - 10.3 mg/dL 7.8(L) - -  Total Protein 6.5 - 8.1 g/dL 5.6(L) - -  Total Bilirubin 0.3 - 1.2 mg/dL 0.6 - -  Alkaline Phos 38 - 126 U/L 39 - -  AST 15 - 41 U/L 36 - -  ALT 0 - 44 U/L 52(H) - -     No results found for this or any previous visit (from the past 240 hour(s)).  Imaging: CT abdomen/pelvis w/o contrast 01/22/20: IMPRESSION: 1. Marked severity cellulitis involving the anterior and lateral aspects of the lower abdominal and pelvic wall, right greater than left. 2. Small amount of free fluid within the abdomen and pelvis, likely related to the patient's peritoneal dialysis catheter. 3. Small amount of free air within the upper abdomen, as described above, which may also be related to recent peritoneal dialysis. While no abnormal bowel loops are clearly identified, the presence of a perforated hollow viscus cannot completely be excluded. 4. Cholelithiasis. 5. Bilateral nonobstructing renal calculi with additional findings likely consistent with small bladder calculi. 6. Sigmoid diverticulosis. 7. Enlarged prostate gland. 8. Mild aortic atherosclerosis.   ASSESSMENT & PLAN:    1. ESRD on dialysis ([place]) 2. Abdominal wall cellulitis  Patients abdominal wall cellulitis appears to have resolved after his dalbvancin infusion which provides about 2 weeks of coverage.  He has no systemic infectious symptoms and swelling/pain in abdomen has improved.  Abdomen is currently soft and  non-tender.  Skin has a wrinkled/contractured appearance indicating the swelling has improved as well.  Discussed with patient that I am hopeful is infection is eradicated and currently there is no evidence of ongoing infection that would warrant further antibiotics.  Will have him follow up with [place] as needed and return precautions discussed.    Raynelle Highland for Infectious Disease [facility] 02/04/2020, 10:07 AM

## 2020-02-05 DIAGNOSIS — Z79899 Other long term (current) drug therapy: Secondary | ICD-10-CM | POA: Diagnosis not present

## 2020-02-05 DIAGNOSIS — D509 Iron deficiency anemia, unspecified: Secondary | ICD-10-CM | POA: Diagnosis not present

## 2020-02-05 DIAGNOSIS — E440 Moderate protein-calorie malnutrition: Secondary | ICD-10-CM | POA: Diagnosis not present

## 2020-02-05 DIAGNOSIS — N186 End stage renal disease: Secondary | ICD-10-CM | POA: Diagnosis not present

## 2020-02-05 DIAGNOSIS — R82998 Other abnormal findings in urine: Secondary | ICD-10-CM | POA: Diagnosis not present

## 2020-02-05 DIAGNOSIS — Z992 Dependence on renal dialysis: Secondary | ICD-10-CM | POA: Diagnosis not present

## 2020-02-05 DIAGNOSIS — N2581 Secondary hyperparathyroidism of renal origin: Secondary | ICD-10-CM | POA: Diagnosis not present

## 2020-02-05 DIAGNOSIS — D631 Anemia in chronic kidney disease: Secondary | ICD-10-CM | POA: Diagnosis not present

## 2020-02-06 DIAGNOSIS — R21 Rash and other nonspecific skin eruption: Secondary | ICD-10-CM | POA: Diagnosis not present

## 2020-02-06 DIAGNOSIS — R82998 Other abnormal findings in urine: Secondary | ICD-10-CM | POA: Diagnosis not present

## 2020-02-06 DIAGNOSIS — D509 Iron deficiency anemia, unspecified: Secondary | ICD-10-CM | POA: Diagnosis not present

## 2020-02-06 DIAGNOSIS — E440 Moderate protein-calorie malnutrition: Secondary | ICD-10-CM | POA: Diagnosis not present

## 2020-02-06 DIAGNOSIS — Z992 Dependence on renal dialysis: Secondary | ICD-10-CM | POA: Diagnosis not present

## 2020-02-06 DIAGNOSIS — Z299 Encounter for prophylactic measures, unspecified: Secondary | ICD-10-CM | POA: Diagnosis not present

## 2020-02-06 DIAGNOSIS — J309 Allergic rhinitis, unspecified: Secondary | ICD-10-CM | POA: Diagnosis not present

## 2020-02-06 DIAGNOSIS — Z79899 Other long term (current) drug therapy: Secondary | ICD-10-CM | POA: Diagnosis not present

## 2020-02-06 DIAGNOSIS — D631 Anemia in chronic kidney disease: Secondary | ICD-10-CM | POA: Diagnosis not present

## 2020-02-06 DIAGNOSIS — N2581 Secondary hyperparathyroidism of renal origin: Secondary | ICD-10-CM | POA: Diagnosis not present

## 2020-02-06 DIAGNOSIS — H1045 Other chronic allergic conjunctivitis: Secondary | ICD-10-CM | POA: Diagnosis not present

## 2020-02-06 DIAGNOSIS — N186 End stage renal disease: Secondary | ICD-10-CM | POA: Diagnosis not present

## 2020-02-07 DIAGNOSIS — N2581 Secondary hyperparathyroidism of renal origin: Secondary | ICD-10-CM | POA: Diagnosis not present

## 2020-02-07 DIAGNOSIS — Z992 Dependence on renal dialysis: Secondary | ICD-10-CM | POA: Diagnosis not present

## 2020-02-07 DIAGNOSIS — D509 Iron deficiency anemia, unspecified: Secondary | ICD-10-CM | POA: Diagnosis not present

## 2020-02-07 DIAGNOSIS — D631 Anemia in chronic kidney disease: Secondary | ICD-10-CM | POA: Diagnosis not present

## 2020-02-07 DIAGNOSIS — E440 Moderate protein-calorie malnutrition: Secondary | ICD-10-CM | POA: Diagnosis not present

## 2020-02-07 DIAGNOSIS — N186 End stage renal disease: Secondary | ICD-10-CM | POA: Diagnosis not present

## 2020-02-07 DIAGNOSIS — Z79899 Other long term (current) drug therapy: Secondary | ICD-10-CM | POA: Diagnosis not present

## 2020-02-07 DIAGNOSIS — R82998 Other abnormal findings in urine: Secondary | ICD-10-CM | POA: Diagnosis not present

## 2020-02-08 DIAGNOSIS — E440 Moderate protein-calorie malnutrition: Secondary | ICD-10-CM | POA: Diagnosis not present

## 2020-02-08 DIAGNOSIS — D631 Anemia in chronic kidney disease: Secondary | ICD-10-CM | POA: Diagnosis not present

## 2020-02-08 DIAGNOSIS — N186 End stage renal disease: Secondary | ICD-10-CM | POA: Diagnosis not present

## 2020-02-08 DIAGNOSIS — Z992 Dependence on renal dialysis: Secondary | ICD-10-CM | POA: Diagnosis not present

## 2020-02-08 DIAGNOSIS — R82998 Other abnormal findings in urine: Secondary | ICD-10-CM | POA: Diagnosis not present

## 2020-02-08 DIAGNOSIS — D509 Iron deficiency anemia, unspecified: Secondary | ICD-10-CM | POA: Diagnosis not present

## 2020-02-08 DIAGNOSIS — Z79899 Other long term (current) drug therapy: Secondary | ICD-10-CM | POA: Diagnosis not present

## 2020-02-08 DIAGNOSIS — N2581 Secondary hyperparathyroidism of renal origin: Secondary | ICD-10-CM | POA: Diagnosis not present

## 2020-02-09 DIAGNOSIS — Z992 Dependence on renal dialysis: Secondary | ICD-10-CM | POA: Diagnosis not present

## 2020-02-09 DIAGNOSIS — N2581 Secondary hyperparathyroidism of renal origin: Secondary | ICD-10-CM | POA: Diagnosis not present

## 2020-02-09 DIAGNOSIS — Z79899 Other long term (current) drug therapy: Secondary | ICD-10-CM | POA: Diagnosis not present

## 2020-02-09 DIAGNOSIS — D509 Iron deficiency anemia, unspecified: Secondary | ICD-10-CM | POA: Diagnosis not present

## 2020-02-09 DIAGNOSIS — E440 Moderate protein-calorie malnutrition: Secondary | ICD-10-CM | POA: Diagnosis not present

## 2020-02-09 DIAGNOSIS — D631 Anemia in chronic kidney disease: Secondary | ICD-10-CM | POA: Diagnosis not present

## 2020-02-09 DIAGNOSIS — R82998 Other abnormal findings in urine: Secondary | ICD-10-CM | POA: Diagnosis not present

## 2020-02-09 DIAGNOSIS — N186 End stage renal disease: Secondary | ICD-10-CM | POA: Diagnosis not present

## 2020-02-10 DIAGNOSIS — D631 Anemia in chronic kidney disease: Secondary | ICD-10-CM | POA: Diagnosis not present

## 2020-02-10 DIAGNOSIS — E440 Moderate protein-calorie malnutrition: Secondary | ICD-10-CM | POA: Diagnosis not present

## 2020-02-10 DIAGNOSIS — Z79899 Other long term (current) drug therapy: Secondary | ICD-10-CM | POA: Diagnosis not present

## 2020-02-10 DIAGNOSIS — D509 Iron deficiency anemia, unspecified: Secondary | ICD-10-CM | POA: Diagnosis not present

## 2020-02-10 DIAGNOSIS — N186 End stage renal disease: Secondary | ICD-10-CM | POA: Diagnosis not present

## 2020-02-10 DIAGNOSIS — R82998 Other abnormal findings in urine: Secondary | ICD-10-CM | POA: Diagnosis not present

## 2020-02-10 DIAGNOSIS — Z992 Dependence on renal dialysis: Secondary | ICD-10-CM | POA: Diagnosis not present

## 2020-02-10 DIAGNOSIS — N2581 Secondary hyperparathyroidism of renal origin: Secondary | ICD-10-CM | POA: Diagnosis not present

## 2020-02-11 DIAGNOSIS — E440 Moderate protein-calorie malnutrition: Secondary | ICD-10-CM | POA: Diagnosis not present

## 2020-02-11 DIAGNOSIS — Z79899 Other long term (current) drug therapy: Secondary | ICD-10-CM | POA: Diagnosis not present

## 2020-02-11 DIAGNOSIS — D509 Iron deficiency anemia, unspecified: Secondary | ICD-10-CM | POA: Diagnosis not present

## 2020-02-11 DIAGNOSIS — R82998 Other abnormal findings in urine: Secondary | ICD-10-CM | POA: Diagnosis not present

## 2020-02-11 DIAGNOSIS — N2581 Secondary hyperparathyroidism of renal origin: Secondary | ICD-10-CM | POA: Diagnosis not present

## 2020-02-11 DIAGNOSIS — Z992 Dependence on renal dialysis: Secondary | ICD-10-CM | POA: Diagnosis not present

## 2020-02-11 DIAGNOSIS — N186 End stage renal disease: Secondary | ICD-10-CM | POA: Diagnosis not present

## 2020-02-11 DIAGNOSIS — D631 Anemia in chronic kidney disease: Secondary | ICD-10-CM | POA: Diagnosis not present

## 2020-02-12 DIAGNOSIS — R82998 Other abnormal findings in urine: Secondary | ICD-10-CM | POA: Diagnosis not present

## 2020-02-12 DIAGNOSIS — Z79899 Other long term (current) drug therapy: Secondary | ICD-10-CM | POA: Diagnosis not present

## 2020-02-12 DIAGNOSIS — D509 Iron deficiency anemia, unspecified: Secondary | ICD-10-CM | POA: Diagnosis not present

## 2020-02-12 DIAGNOSIS — N186 End stage renal disease: Secondary | ICD-10-CM | POA: Diagnosis not present

## 2020-02-12 DIAGNOSIS — D631 Anemia in chronic kidney disease: Secondary | ICD-10-CM | POA: Diagnosis not present

## 2020-02-12 DIAGNOSIS — E440 Moderate protein-calorie malnutrition: Secondary | ICD-10-CM | POA: Diagnosis not present

## 2020-02-12 DIAGNOSIS — N2581 Secondary hyperparathyroidism of renal origin: Secondary | ICD-10-CM | POA: Diagnosis not present

## 2020-02-12 DIAGNOSIS — Z992 Dependence on renal dialysis: Secondary | ICD-10-CM | POA: Diagnosis not present

## 2020-02-13 DIAGNOSIS — N2581 Secondary hyperparathyroidism of renal origin: Secondary | ICD-10-CM | POA: Diagnosis not present

## 2020-02-13 DIAGNOSIS — E440 Moderate protein-calorie malnutrition: Secondary | ICD-10-CM | POA: Diagnosis not present

## 2020-02-13 DIAGNOSIS — Z992 Dependence on renal dialysis: Secondary | ICD-10-CM | POA: Diagnosis not present

## 2020-02-13 DIAGNOSIS — D509 Iron deficiency anemia, unspecified: Secondary | ICD-10-CM | POA: Diagnosis not present

## 2020-02-13 DIAGNOSIS — N186 End stage renal disease: Secondary | ICD-10-CM | POA: Diagnosis not present

## 2020-02-13 DIAGNOSIS — R82998 Other abnormal findings in urine: Secondary | ICD-10-CM | POA: Diagnosis not present

## 2020-02-13 DIAGNOSIS — Z79899 Other long term (current) drug therapy: Secondary | ICD-10-CM | POA: Diagnosis not present

## 2020-02-13 DIAGNOSIS — D631 Anemia in chronic kidney disease: Secondary | ICD-10-CM | POA: Diagnosis not present

## 2020-02-14 DIAGNOSIS — D509 Iron deficiency anemia, unspecified: Secondary | ICD-10-CM | POA: Diagnosis not present

## 2020-02-14 DIAGNOSIS — Z79899 Other long term (current) drug therapy: Secondary | ICD-10-CM | POA: Diagnosis not present

## 2020-02-14 DIAGNOSIS — E440 Moderate protein-calorie malnutrition: Secondary | ICD-10-CM | POA: Diagnosis not present

## 2020-02-14 DIAGNOSIS — R82998 Other abnormal findings in urine: Secondary | ICD-10-CM | POA: Diagnosis not present

## 2020-02-14 DIAGNOSIS — N186 End stage renal disease: Secondary | ICD-10-CM | POA: Diagnosis not present

## 2020-02-14 DIAGNOSIS — N2581 Secondary hyperparathyroidism of renal origin: Secondary | ICD-10-CM | POA: Diagnosis not present

## 2020-02-14 DIAGNOSIS — Z992 Dependence on renal dialysis: Secondary | ICD-10-CM | POA: Diagnosis not present

## 2020-02-14 DIAGNOSIS — D631 Anemia in chronic kidney disease: Secondary | ICD-10-CM | POA: Diagnosis not present

## 2020-02-15 DIAGNOSIS — N2581 Secondary hyperparathyroidism of renal origin: Secondary | ICD-10-CM | POA: Diagnosis not present

## 2020-02-15 DIAGNOSIS — N186 End stage renal disease: Secondary | ICD-10-CM | POA: Diagnosis not present

## 2020-02-15 DIAGNOSIS — E440 Moderate protein-calorie malnutrition: Secondary | ICD-10-CM | POA: Diagnosis not present

## 2020-02-15 DIAGNOSIS — Z992 Dependence on renal dialysis: Secondary | ICD-10-CM | POA: Diagnosis not present

## 2020-02-15 DIAGNOSIS — D631 Anemia in chronic kidney disease: Secondary | ICD-10-CM | POA: Diagnosis not present

## 2020-02-15 DIAGNOSIS — D509 Iron deficiency anemia, unspecified: Secondary | ICD-10-CM | POA: Diagnosis not present

## 2020-02-15 DIAGNOSIS — R82998 Other abnormal findings in urine: Secondary | ICD-10-CM | POA: Diagnosis not present

## 2020-02-15 DIAGNOSIS — Z79899 Other long term (current) drug therapy: Secondary | ICD-10-CM | POA: Diagnosis not present

## 2020-02-16 DIAGNOSIS — E440 Moderate protein-calorie malnutrition: Secondary | ICD-10-CM | POA: Diagnosis not present

## 2020-02-16 DIAGNOSIS — Z79899 Other long term (current) drug therapy: Secondary | ICD-10-CM | POA: Diagnosis not present

## 2020-02-16 DIAGNOSIS — Z992 Dependence on renal dialysis: Secondary | ICD-10-CM | POA: Diagnosis not present

## 2020-02-16 DIAGNOSIS — D631 Anemia in chronic kidney disease: Secondary | ICD-10-CM | POA: Diagnosis not present

## 2020-02-16 DIAGNOSIS — N2581 Secondary hyperparathyroidism of renal origin: Secondary | ICD-10-CM | POA: Diagnosis not present

## 2020-02-16 DIAGNOSIS — R82998 Other abnormal findings in urine: Secondary | ICD-10-CM | POA: Diagnosis not present

## 2020-02-16 DIAGNOSIS — D509 Iron deficiency anemia, unspecified: Secondary | ICD-10-CM | POA: Diagnosis not present

## 2020-02-16 DIAGNOSIS — N186 End stage renal disease: Secondary | ICD-10-CM | POA: Diagnosis not present

## 2020-02-17 DIAGNOSIS — E440 Moderate protein-calorie malnutrition: Secondary | ICD-10-CM | POA: Diagnosis not present

## 2020-02-17 DIAGNOSIS — N186 End stage renal disease: Secondary | ICD-10-CM | POA: Diagnosis not present

## 2020-02-17 DIAGNOSIS — D509 Iron deficiency anemia, unspecified: Secondary | ICD-10-CM | POA: Diagnosis not present

## 2020-02-17 DIAGNOSIS — R82998 Other abnormal findings in urine: Secondary | ICD-10-CM | POA: Diagnosis not present

## 2020-02-17 DIAGNOSIS — D631 Anemia in chronic kidney disease: Secondary | ICD-10-CM | POA: Diagnosis not present

## 2020-02-17 DIAGNOSIS — Z992 Dependence on renal dialysis: Secondary | ICD-10-CM | POA: Diagnosis not present

## 2020-02-17 DIAGNOSIS — Z79899 Other long term (current) drug therapy: Secondary | ICD-10-CM | POA: Diagnosis not present

## 2020-02-17 DIAGNOSIS — N2581 Secondary hyperparathyroidism of renal origin: Secondary | ICD-10-CM | POA: Diagnosis not present

## 2020-02-18 DIAGNOSIS — D509 Iron deficiency anemia, unspecified: Secondary | ICD-10-CM | POA: Diagnosis not present

## 2020-02-18 DIAGNOSIS — N2581 Secondary hyperparathyroidism of renal origin: Secondary | ICD-10-CM | POA: Diagnosis not present

## 2020-02-18 DIAGNOSIS — Z992 Dependence on renal dialysis: Secondary | ICD-10-CM | POA: Diagnosis not present

## 2020-02-18 DIAGNOSIS — Z79899 Other long term (current) drug therapy: Secondary | ICD-10-CM | POA: Diagnosis not present

## 2020-02-18 DIAGNOSIS — D631 Anemia in chronic kidney disease: Secondary | ICD-10-CM | POA: Diagnosis not present

## 2020-02-18 DIAGNOSIS — N186 End stage renal disease: Secondary | ICD-10-CM | POA: Diagnosis not present

## 2020-02-18 DIAGNOSIS — R82998 Other abnormal findings in urine: Secondary | ICD-10-CM | POA: Diagnosis not present

## 2020-02-18 DIAGNOSIS — E440 Moderate protein-calorie malnutrition: Secondary | ICD-10-CM | POA: Diagnosis not present

## 2020-02-19 DIAGNOSIS — N186 End stage renal disease: Secondary | ICD-10-CM | POA: Diagnosis not present

## 2020-02-19 DIAGNOSIS — Z79899 Other long term (current) drug therapy: Secondary | ICD-10-CM | POA: Diagnosis not present

## 2020-02-19 DIAGNOSIS — D509 Iron deficiency anemia, unspecified: Secondary | ICD-10-CM | POA: Diagnosis not present

## 2020-02-19 DIAGNOSIS — R82998 Other abnormal findings in urine: Secondary | ICD-10-CM | POA: Diagnosis not present

## 2020-02-19 DIAGNOSIS — E440 Moderate protein-calorie malnutrition: Secondary | ICD-10-CM | POA: Diagnosis not present

## 2020-02-19 DIAGNOSIS — N2581 Secondary hyperparathyroidism of renal origin: Secondary | ICD-10-CM | POA: Diagnosis not present

## 2020-02-19 DIAGNOSIS — D631 Anemia in chronic kidney disease: Secondary | ICD-10-CM | POA: Diagnosis not present

## 2020-02-19 DIAGNOSIS — Z992 Dependence on renal dialysis: Secondary | ICD-10-CM | POA: Diagnosis not present

## 2020-02-20 DIAGNOSIS — Z79899 Other long term (current) drug therapy: Secondary | ICD-10-CM | POA: Diagnosis not present

## 2020-02-20 DIAGNOSIS — R82998 Other abnormal findings in urine: Secondary | ICD-10-CM | POA: Diagnosis not present

## 2020-02-20 DIAGNOSIS — D509 Iron deficiency anemia, unspecified: Secondary | ICD-10-CM | POA: Diagnosis not present

## 2020-02-20 DIAGNOSIS — E440 Moderate protein-calorie malnutrition: Secondary | ICD-10-CM | POA: Diagnosis not present

## 2020-02-20 DIAGNOSIS — N186 End stage renal disease: Secondary | ICD-10-CM | POA: Diagnosis not present

## 2020-02-20 DIAGNOSIS — Z992 Dependence on renal dialysis: Secondary | ICD-10-CM | POA: Diagnosis not present

## 2020-02-20 DIAGNOSIS — D631 Anemia in chronic kidney disease: Secondary | ICD-10-CM | POA: Diagnosis not present

## 2020-02-20 DIAGNOSIS — N2581 Secondary hyperparathyroidism of renal origin: Secondary | ICD-10-CM | POA: Diagnosis not present

## 2020-02-21 DIAGNOSIS — E440 Moderate protein-calorie malnutrition: Secondary | ICD-10-CM | POA: Diagnosis not present

## 2020-02-21 DIAGNOSIS — D509 Iron deficiency anemia, unspecified: Secondary | ICD-10-CM | POA: Diagnosis not present

## 2020-02-21 DIAGNOSIS — N2581 Secondary hyperparathyroidism of renal origin: Secondary | ICD-10-CM | POA: Diagnosis not present

## 2020-02-21 DIAGNOSIS — R82998 Other abnormal findings in urine: Secondary | ICD-10-CM | POA: Diagnosis not present

## 2020-02-21 DIAGNOSIS — N186 End stage renal disease: Secondary | ICD-10-CM | POA: Diagnosis not present

## 2020-02-21 DIAGNOSIS — Z992 Dependence on renal dialysis: Secondary | ICD-10-CM | POA: Diagnosis not present

## 2020-02-21 DIAGNOSIS — D631 Anemia in chronic kidney disease: Secondary | ICD-10-CM | POA: Diagnosis not present

## 2020-02-21 DIAGNOSIS — Z79899 Other long term (current) drug therapy: Secondary | ICD-10-CM | POA: Diagnosis not present

## 2020-02-22 DIAGNOSIS — Z992 Dependence on renal dialysis: Secondary | ICD-10-CM | POA: Diagnosis not present

## 2020-02-22 DIAGNOSIS — D631 Anemia in chronic kidney disease: Secondary | ICD-10-CM | POA: Diagnosis not present

## 2020-02-22 DIAGNOSIS — R82998 Other abnormal findings in urine: Secondary | ICD-10-CM | POA: Diagnosis not present

## 2020-02-22 DIAGNOSIS — E440 Moderate protein-calorie malnutrition: Secondary | ICD-10-CM | POA: Diagnosis not present

## 2020-02-22 DIAGNOSIS — Z79899 Other long term (current) drug therapy: Secondary | ICD-10-CM | POA: Diagnosis not present

## 2020-02-22 DIAGNOSIS — D509 Iron deficiency anemia, unspecified: Secondary | ICD-10-CM | POA: Diagnosis not present

## 2020-02-22 DIAGNOSIS — N186 End stage renal disease: Secondary | ICD-10-CM | POA: Diagnosis not present

## 2020-02-22 DIAGNOSIS — N2581 Secondary hyperparathyroidism of renal origin: Secondary | ICD-10-CM | POA: Diagnosis not present

## 2020-02-23 DIAGNOSIS — Z79899 Other long term (current) drug therapy: Secondary | ICD-10-CM | POA: Diagnosis not present

## 2020-02-23 DIAGNOSIS — E440 Moderate protein-calorie malnutrition: Secondary | ICD-10-CM | POA: Diagnosis not present

## 2020-02-23 DIAGNOSIS — N186 End stage renal disease: Secondary | ICD-10-CM | POA: Diagnosis not present

## 2020-02-23 DIAGNOSIS — N2581 Secondary hyperparathyroidism of renal origin: Secondary | ICD-10-CM | POA: Diagnosis not present

## 2020-02-23 DIAGNOSIS — D631 Anemia in chronic kidney disease: Secondary | ICD-10-CM | POA: Diagnosis not present

## 2020-02-23 DIAGNOSIS — D509 Iron deficiency anemia, unspecified: Secondary | ICD-10-CM | POA: Diagnosis not present

## 2020-02-23 DIAGNOSIS — Z992 Dependence on renal dialysis: Secondary | ICD-10-CM | POA: Diagnosis not present

## 2020-02-23 DIAGNOSIS — R82998 Other abnormal findings in urine: Secondary | ICD-10-CM | POA: Diagnosis not present

## 2020-02-24 DIAGNOSIS — N186 End stage renal disease: Secondary | ICD-10-CM | POA: Diagnosis not present

## 2020-02-24 DIAGNOSIS — Z992 Dependence on renal dialysis: Secondary | ICD-10-CM | POA: Diagnosis not present

## 2020-02-24 DIAGNOSIS — R82998 Other abnormal findings in urine: Secondary | ICD-10-CM | POA: Diagnosis not present

## 2020-02-24 DIAGNOSIS — D631 Anemia in chronic kidney disease: Secondary | ICD-10-CM | POA: Diagnosis not present

## 2020-02-24 DIAGNOSIS — N2581 Secondary hyperparathyroidism of renal origin: Secondary | ICD-10-CM | POA: Diagnosis not present

## 2020-02-24 DIAGNOSIS — D509 Iron deficiency anemia, unspecified: Secondary | ICD-10-CM | POA: Diagnosis not present

## 2020-02-24 DIAGNOSIS — Z79899 Other long term (current) drug therapy: Secondary | ICD-10-CM | POA: Diagnosis not present

## 2020-02-24 DIAGNOSIS — E440 Moderate protein-calorie malnutrition: Secondary | ICD-10-CM | POA: Diagnosis not present

## 2020-02-25 DIAGNOSIS — Z992 Dependence on renal dialysis: Secondary | ICD-10-CM | POA: Diagnosis not present

## 2020-02-25 DIAGNOSIS — N2581 Secondary hyperparathyroidism of renal origin: Secondary | ICD-10-CM | POA: Diagnosis not present

## 2020-02-25 DIAGNOSIS — R82998 Other abnormal findings in urine: Secondary | ICD-10-CM | POA: Diagnosis not present

## 2020-02-25 DIAGNOSIS — D509 Iron deficiency anemia, unspecified: Secondary | ICD-10-CM | POA: Diagnosis not present

## 2020-02-25 DIAGNOSIS — E440 Moderate protein-calorie malnutrition: Secondary | ICD-10-CM | POA: Diagnosis not present

## 2020-02-25 DIAGNOSIS — D631 Anemia in chronic kidney disease: Secondary | ICD-10-CM | POA: Diagnosis not present

## 2020-02-25 DIAGNOSIS — N186 End stage renal disease: Secondary | ICD-10-CM | POA: Diagnosis not present

## 2020-02-25 DIAGNOSIS — Z79899 Other long term (current) drug therapy: Secondary | ICD-10-CM | POA: Diagnosis not present

## 2020-02-26 DIAGNOSIS — N2581 Secondary hyperparathyroidism of renal origin: Secondary | ICD-10-CM | POA: Diagnosis not present

## 2020-02-26 DIAGNOSIS — E440 Moderate protein-calorie malnutrition: Secondary | ICD-10-CM | POA: Diagnosis not present

## 2020-02-26 DIAGNOSIS — D631 Anemia in chronic kidney disease: Secondary | ICD-10-CM | POA: Diagnosis not present

## 2020-02-26 DIAGNOSIS — D509 Iron deficiency anemia, unspecified: Secondary | ICD-10-CM | POA: Diagnosis not present

## 2020-02-26 DIAGNOSIS — Z992 Dependence on renal dialysis: Secondary | ICD-10-CM | POA: Diagnosis not present

## 2020-02-26 DIAGNOSIS — Z79899 Other long term (current) drug therapy: Secondary | ICD-10-CM | POA: Diagnosis not present

## 2020-02-26 DIAGNOSIS — R82998 Other abnormal findings in urine: Secondary | ICD-10-CM | POA: Diagnosis not present

## 2020-02-26 DIAGNOSIS — N186 End stage renal disease: Secondary | ICD-10-CM | POA: Diagnosis not present

## 2020-02-27 DIAGNOSIS — N186 End stage renal disease: Secondary | ICD-10-CM | POA: Diagnosis not present

## 2020-02-27 DIAGNOSIS — N2581 Secondary hyperparathyroidism of renal origin: Secondary | ICD-10-CM | POA: Diagnosis not present

## 2020-02-27 DIAGNOSIS — Z992 Dependence on renal dialysis: Secondary | ICD-10-CM | POA: Diagnosis not present

## 2020-02-27 DIAGNOSIS — Z79899 Other long term (current) drug therapy: Secondary | ICD-10-CM | POA: Diagnosis not present

## 2020-02-27 DIAGNOSIS — D631 Anemia in chronic kidney disease: Secondary | ICD-10-CM | POA: Diagnosis not present

## 2020-02-27 DIAGNOSIS — E440 Moderate protein-calorie malnutrition: Secondary | ICD-10-CM | POA: Diagnosis not present

## 2020-02-27 DIAGNOSIS — D509 Iron deficiency anemia, unspecified: Secondary | ICD-10-CM | POA: Diagnosis not present

## 2020-02-27 DIAGNOSIS — R82998 Other abnormal findings in urine: Secondary | ICD-10-CM | POA: Diagnosis not present

## 2020-02-28 DIAGNOSIS — N2581 Secondary hyperparathyroidism of renal origin: Secondary | ICD-10-CM | POA: Diagnosis not present

## 2020-02-28 DIAGNOSIS — E785 Hyperlipidemia, unspecified: Secondary | ICD-10-CM | POA: Diagnosis not present

## 2020-02-28 DIAGNOSIS — Z79899 Other long term (current) drug therapy: Secondary | ICD-10-CM | POA: Diagnosis not present

## 2020-02-28 DIAGNOSIS — M109 Gout, unspecified: Secondary | ICD-10-CM | POA: Diagnosis not present

## 2020-02-28 DIAGNOSIS — D631 Anemia in chronic kidney disease: Secondary | ICD-10-CM | POA: Diagnosis not present

## 2020-02-28 DIAGNOSIS — Z0000 Encounter for general adult medical examination without abnormal findings: Secondary | ICD-10-CM | POA: Diagnosis not present

## 2020-02-28 DIAGNOSIS — N186 End stage renal disease: Secondary | ICD-10-CM | POA: Diagnosis not present

## 2020-02-28 DIAGNOSIS — D509 Iron deficiency anemia, unspecified: Secondary | ICD-10-CM | POA: Diagnosis not present

## 2020-02-28 DIAGNOSIS — R82998 Other abnormal findings in urine: Secondary | ICD-10-CM | POA: Diagnosis not present

## 2020-02-28 DIAGNOSIS — E440 Moderate protein-calorie malnutrition: Secondary | ICD-10-CM | POA: Diagnosis not present

## 2020-02-28 DIAGNOSIS — Z992 Dependence on renal dialysis: Secondary | ICD-10-CM | POA: Diagnosis not present

## 2020-02-29 DIAGNOSIS — R82998 Other abnormal findings in urine: Secondary | ICD-10-CM | POA: Diagnosis not present

## 2020-02-29 DIAGNOSIS — Z992 Dependence on renal dialysis: Secondary | ICD-10-CM | POA: Diagnosis not present

## 2020-02-29 DIAGNOSIS — D631 Anemia in chronic kidney disease: Secondary | ICD-10-CM | POA: Diagnosis not present

## 2020-02-29 DIAGNOSIS — N186 End stage renal disease: Secondary | ICD-10-CM | POA: Diagnosis not present

## 2020-02-29 DIAGNOSIS — E440 Moderate protein-calorie malnutrition: Secondary | ICD-10-CM | POA: Diagnosis not present

## 2020-02-29 DIAGNOSIS — Z79899 Other long term (current) drug therapy: Secondary | ICD-10-CM | POA: Diagnosis not present

## 2020-02-29 DIAGNOSIS — D509 Iron deficiency anemia, unspecified: Secondary | ICD-10-CM | POA: Diagnosis not present

## 2020-02-29 DIAGNOSIS — N2581 Secondary hyperparathyroidism of renal origin: Secondary | ICD-10-CM | POA: Diagnosis not present

## 2020-03-01 DIAGNOSIS — E440 Moderate protein-calorie malnutrition: Secondary | ICD-10-CM | POA: Diagnosis not present

## 2020-03-01 DIAGNOSIS — Z79899 Other long term (current) drug therapy: Secondary | ICD-10-CM | POA: Diagnosis not present

## 2020-03-01 DIAGNOSIS — D631 Anemia in chronic kidney disease: Secondary | ICD-10-CM | POA: Diagnosis not present

## 2020-03-01 DIAGNOSIS — D509 Iron deficiency anemia, unspecified: Secondary | ICD-10-CM | POA: Diagnosis not present

## 2020-03-01 DIAGNOSIS — R82998 Other abnormal findings in urine: Secondary | ICD-10-CM | POA: Diagnosis not present

## 2020-03-01 DIAGNOSIS — L2084 Intrinsic (allergic) eczema: Secondary | ICD-10-CM | POA: Diagnosis not present

## 2020-03-01 DIAGNOSIS — N2581 Secondary hyperparathyroidism of renal origin: Secondary | ICD-10-CM | POA: Diagnosis not present

## 2020-03-01 DIAGNOSIS — Z992 Dependence on renal dialysis: Secondary | ICD-10-CM | POA: Diagnosis not present

## 2020-03-01 DIAGNOSIS — N186 End stage renal disease: Secondary | ICD-10-CM | POA: Diagnosis not present

## 2020-03-02 ENCOUNTER — Other Ambulatory Visit: Payer: Self-pay

## 2020-03-02 ENCOUNTER — Other Ambulatory Visit: Payer: Self-pay | Admitting: Nephrology

## 2020-03-02 ENCOUNTER — Ambulatory Visit
Admission: RE | Admit: 2020-03-02 | Discharge: 2020-03-02 | Disposition: A | Discharge status: Home / Self Care | Payer: Medicare Other | Source: Ambulatory Visit | Attending: Nephrology | Admitting: Nephrology

## 2020-03-02 DIAGNOSIS — Z79899 Other long term (current) drug therapy: Secondary | ICD-10-CM | POA: Diagnosis not present

## 2020-03-02 DIAGNOSIS — Z4932 Encounter for adequacy testing for peritoneal dialysis: Secondary | ICD-10-CM | POA: Diagnosis not present

## 2020-03-02 DIAGNOSIS — E440 Moderate protein-calorie malnutrition: Secondary | ICD-10-CM | POA: Diagnosis not present

## 2020-03-02 DIAGNOSIS — N186 End stage renal disease: Secondary | ICD-10-CM | POA: Diagnosis not present

## 2020-03-02 DIAGNOSIS — K769 Liver disease, unspecified: Secondary | ICD-10-CM | POA: Diagnosis not present

## 2020-03-02 DIAGNOSIS — R82998 Other abnormal findings in urine: Secondary | ICD-10-CM | POA: Diagnosis not present

## 2020-03-02 DIAGNOSIS — R109 Unspecified abdominal pain: Secondary | ICD-10-CM

## 2020-03-02 DIAGNOSIS — D631 Anemia in chronic kidney disease: Secondary | ICD-10-CM | POA: Diagnosis not present

## 2020-03-02 DIAGNOSIS — N2581 Secondary hyperparathyroidism of renal origin: Secondary | ICD-10-CM | POA: Diagnosis not present

## 2020-03-02 DIAGNOSIS — Z992 Dependence on renal dialysis: Secondary | ICD-10-CM | POA: Diagnosis not present

## 2020-03-02 DIAGNOSIS — D509 Iron deficiency anemia, unspecified: Secondary | ICD-10-CM | POA: Diagnosis not present

## 2020-03-02 DIAGNOSIS — K8020 Calculus of gallbladder without cholecystitis without obstruction: Secondary | ICD-10-CM | POA: Diagnosis not present

## 2020-03-02 DIAGNOSIS — N200 Calculus of kidney: Secondary | ICD-10-CM | POA: Diagnosis not present

## 2020-03-02 DIAGNOSIS — R1900 Intra-abdominal and pelvic swelling, mass and lump, unspecified site: Secondary | ICD-10-CM | POA: Diagnosis not present

## 2020-03-03 DIAGNOSIS — D631 Anemia in chronic kidney disease: Secondary | ICD-10-CM | POA: Diagnosis not present

## 2020-03-03 DIAGNOSIS — I509 Heart failure, unspecified: Secondary | ICD-10-CM | POA: Diagnosis not present

## 2020-03-03 DIAGNOSIS — J449 Chronic obstructive pulmonary disease, unspecified: Secondary | ICD-10-CM | POA: Diagnosis not present

## 2020-03-03 DIAGNOSIS — Z4902 Encounter for fitting and adjustment of peritoneal dialysis catheter: Secondary | ICD-10-CM | POA: Diagnosis not present

## 2020-03-03 DIAGNOSIS — N186 End stage renal disease: Secondary | ICD-10-CM | POA: Diagnosis not present

## 2020-03-03 DIAGNOSIS — N185 Chronic kidney disease, stage 5: Secondary | ICD-10-CM | POA: Diagnosis not present

## 2020-03-03 DIAGNOSIS — R82998 Other abnormal findings in urine: Secondary | ICD-10-CM | POA: Diagnosis not present

## 2020-03-03 DIAGNOSIS — K769 Liver disease, unspecified: Secondary | ICD-10-CM | POA: Diagnosis not present

## 2020-03-03 DIAGNOSIS — Z79899 Other long term (current) drug therapy: Secondary | ICD-10-CM | POA: Diagnosis not present

## 2020-03-03 DIAGNOSIS — Z992 Dependence on renal dialysis: Secondary | ICD-10-CM | POA: Diagnosis not present

## 2020-03-03 DIAGNOSIS — D509 Iron deficiency anemia, unspecified: Secondary | ICD-10-CM | POA: Diagnosis not present

## 2020-03-03 DIAGNOSIS — Z4932 Encounter for adequacy testing for peritoneal dialysis: Secondary | ICD-10-CM | POA: Diagnosis not present

## 2020-03-03 DIAGNOSIS — N2581 Secondary hyperparathyroidism of renal origin: Secondary | ICD-10-CM | POA: Diagnosis not present

## 2020-03-03 DIAGNOSIS — Z9884 Bariatric surgery status: Secondary | ICD-10-CM | POA: Diagnosis not present

## 2020-03-03 DIAGNOSIS — Z7682 Awaiting organ transplant status: Secondary | ICD-10-CM | POA: Diagnosis not present

## 2020-03-03 DIAGNOSIS — E440 Moderate protein-calorie malnutrition: Secondary | ICD-10-CM | POA: Diagnosis not present

## 2020-03-04 DIAGNOSIS — N186 End stage renal disease: Secondary | ICD-10-CM | POA: Diagnosis not present

## 2020-03-04 DIAGNOSIS — N2581 Secondary hyperparathyroidism of renal origin: Secondary | ICD-10-CM | POA: Diagnosis not present

## 2020-03-04 DIAGNOSIS — K769 Liver disease, unspecified: Secondary | ICD-10-CM | POA: Diagnosis not present

## 2020-03-04 DIAGNOSIS — Z4932 Encounter for adequacy testing for peritoneal dialysis: Secondary | ICD-10-CM | POA: Diagnosis not present

## 2020-03-04 DIAGNOSIS — Z79899 Other long term (current) drug therapy: Secondary | ICD-10-CM | POA: Diagnosis not present

## 2020-03-04 DIAGNOSIS — Z992 Dependence on renal dialysis: Secondary | ICD-10-CM | POA: Diagnosis not present

## 2020-03-04 DIAGNOSIS — D509 Iron deficiency anemia, unspecified: Secondary | ICD-10-CM | POA: Diagnosis not present

## 2020-03-04 DIAGNOSIS — E440 Moderate protein-calorie malnutrition: Secondary | ICD-10-CM | POA: Diagnosis not present

## 2020-03-04 DIAGNOSIS — R82998 Other abnormal findings in urine: Secondary | ICD-10-CM | POA: Diagnosis not present

## 2020-03-04 DIAGNOSIS — D631 Anemia in chronic kidney disease: Secondary | ICD-10-CM | POA: Diagnosis not present

## 2020-03-05 DIAGNOSIS — Z9884 Bariatric surgery status: Secondary | ICD-10-CM | POA: Diagnosis not present

## 2020-03-05 DIAGNOSIS — I509 Heart failure, unspecified: Secondary | ICD-10-CM | POA: Diagnosis not present

## 2020-03-05 DIAGNOSIS — D509 Iron deficiency anemia, unspecified: Secondary | ICD-10-CM | POA: Diagnosis not present

## 2020-03-05 DIAGNOSIS — D631 Anemia in chronic kidney disease: Secondary | ICD-10-CM | POA: Diagnosis not present

## 2020-03-05 DIAGNOSIS — Z4902 Encounter for fitting and adjustment of peritoneal dialysis catheter: Secondary | ICD-10-CM | POA: Diagnosis not present

## 2020-03-05 DIAGNOSIS — R82998 Other abnormal findings in urine: Secondary | ICD-10-CM | POA: Diagnosis not present

## 2020-03-05 DIAGNOSIS — Z79899 Other long term (current) drug therapy: Secondary | ICD-10-CM | POA: Diagnosis not present

## 2020-03-05 DIAGNOSIS — K769 Liver disease, unspecified: Secondary | ICD-10-CM | POA: Diagnosis not present

## 2020-03-05 DIAGNOSIS — Z4932 Encounter for adequacy testing for peritoneal dialysis: Secondary | ICD-10-CM | POA: Diagnosis not present

## 2020-03-05 DIAGNOSIS — N186 End stage renal disease: Secondary | ICD-10-CM | POA: Diagnosis not present

## 2020-03-05 DIAGNOSIS — N2581 Secondary hyperparathyroidism of renal origin: Secondary | ICD-10-CM | POA: Diagnosis not present

## 2020-03-05 DIAGNOSIS — E440 Moderate protein-calorie malnutrition: Secondary | ICD-10-CM | POA: Diagnosis not present

## 2020-03-05 DIAGNOSIS — Z992 Dependence on renal dialysis: Secondary | ICD-10-CM | POA: Diagnosis not present

## 2020-03-05 DIAGNOSIS — J449 Chronic obstructive pulmonary disease, unspecified: Secondary | ICD-10-CM | POA: Diagnosis not present

## 2020-03-06 DIAGNOSIS — D631 Anemia in chronic kidney disease: Secondary | ICD-10-CM | POA: Diagnosis not present

## 2020-03-06 DIAGNOSIS — E440 Moderate protein-calorie malnutrition: Secondary | ICD-10-CM | POA: Diagnosis not present

## 2020-03-06 DIAGNOSIS — K769 Liver disease, unspecified: Secondary | ICD-10-CM | POA: Diagnosis not present

## 2020-03-06 DIAGNOSIS — N186 End stage renal disease: Secondary | ICD-10-CM | POA: Diagnosis not present

## 2020-03-06 DIAGNOSIS — N2581 Secondary hyperparathyroidism of renal origin: Secondary | ICD-10-CM | POA: Diagnosis not present

## 2020-03-06 DIAGNOSIS — R82998 Other abnormal findings in urine: Secondary | ICD-10-CM | POA: Diagnosis not present

## 2020-03-06 DIAGNOSIS — Z992 Dependence on renal dialysis: Secondary | ICD-10-CM | POA: Diagnosis not present

## 2020-03-06 DIAGNOSIS — D509 Iron deficiency anemia, unspecified: Secondary | ICD-10-CM | POA: Diagnosis not present

## 2020-03-06 DIAGNOSIS — Z79899 Other long term (current) drug therapy: Secondary | ICD-10-CM | POA: Diagnosis not present

## 2020-03-06 DIAGNOSIS — Z4932 Encounter for adequacy testing for peritoneal dialysis: Secondary | ICD-10-CM | POA: Diagnosis not present

## 2020-03-07 DIAGNOSIS — N186 End stage renal disease: Secondary | ICD-10-CM | POA: Diagnosis not present

## 2020-03-07 DIAGNOSIS — E440 Moderate protein-calorie malnutrition: Secondary | ICD-10-CM | POA: Diagnosis not present

## 2020-03-07 DIAGNOSIS — Z992 Dependence on renal dialysis: Secondary | ICD-10-CM | POA: Diagnosis not present

## 2020-03-07 DIAGNOSIS — N2581 Secondary hyperparathyroidism of renal origin: Secondary | ICD-10-CM | POA: Diagnosis not present

## 2020-03-07 DIAGNOSIS — D509 Iron deficiency anemia, unspecified: Secondary | ICD-10-CM | POA: Diagnosis not present

## 2020-03-07 DIAGNOSIS — D631 Anemia in chronic kidney disease: Secondary | ICD-10-CM | POA: Diagnosis not present

## 2020-03-07 DIAGNOSIS — Z79899 Other long term (current) drug therapy: Secondary | ICD-10-CM | POA: Diagnosis not present

## 2020-03-07 DIAGNOSIS — R82998 Other abnormal findings in urine: Secondary | ICD-10-CM | POA: Diagnosis not present

## 2020-03-07 DIAGNOSIS — Z4932 Encounter for adequacy testing for peritoneal dialysis: Secondary | ICD-10-CM | POA: Diagnosis not present

## 2020-03-07 DIAGNOSIS — K769 Liver disease, unspecified: Secondary | ICD-10-CM | POA: Diagnosis not present

## 2020-03-08 DIAGNOSIS — D509 Iron deficiency anemia, unspecified: Secondary | ICD-10-CM | POA: Diagnosis not present

## 2020-03-08 DIAGNOSIS — D631 Anemia in chronic kidney disease: Secondary | ICD-10-CM | POA: Diagnosis not present

## 2020-03-08 DIAGNOSIS — Z4932 Encounter for adequacy testing for peritoneal dialysis: Secondary | ICD-10-CM | POA: Diagnosis not present

## 2020-03-08 DIAGNOSIS — R82998 Other abnormal findings in urine: Secondary | ICD-10-CM | POA: Diagnosis not present

## 2020-03-08 DIAGNOSIS — E440 Moderate protein-calorie malnutrition: Secondary | ICD-10-CM | POA: Diagnosis not present

## 2020-03-08 DIAGNOSIS — N186 End stage renal disease: Secondary | ICD-10-CM | POA: Diagnosis not present

## 2020-03-08 DIAGNOSIS — Z79899 Other long term (current) drug therapy: Secondary | ICD-10-CM | POA: Diagnosis not present

## 2020-03-08 DIAGNOSIS — K769 Liver disease, unspecified: Secondary | ICD-10-CM | POA: Diagnosis not present

## 2020-03-08 DIAGNOSIS — N2581 Secondary hyperparathyroidism of renal origin: Secondary | ICD-10-CM | POA: Diagnosis not present

## 2020-03-08 DIAGNOSIS — Z992 Dependence on renal dialysis: Secondary | ICD-10-CM | POA: Diagnosis not present

## 2020-03-09 DIAGNOSIS — Z992 Dependence on renal dialysis: Secondary | ICD-10-CM | POA: Diagnosis not present

## 2020-03-09 DIAGNOSIS — Z4932 Encounter for adequacy testing for peritoneal dialysis: Secondary | ICD-10-CM | POA: Diagnosis not present

## 2020-03-09 DIAGNOSIS — R82998 Other abnormal findings in urine: Secondary | ICD-10-CM | POA: Diagnosis not present

## 2020-03-09 DIAGNOSIS — D509 Iron deficiency anemia, unspecified: Secondary | ICD-10-CM | POA: Diagnosis not present

## 2020-03-09 DIAGNOSIS — N186 End stage renal disease: Secondary | ICD-10-CM | POA: Diagnosis not present

## 2020-03-09 DIAGNOSIS — Z79899 Other long term (current) drug therapy: Secondary | ICD-10-CM | POA: Diagnosis not present

## 2020-03-09 DIAGNOSIS — D631 Anemia in chronic kidney disease: Secondary | ICD-10-CM | POA: Diagnosis not present

## 2020-03-09 DIAGNOSIS — N2581 Secondary hyperparathyroidism of renal origin: Secondary | ICD-10-CM | POA: Diagnosis not present

## 2020-03-09 DIAGNOSIS — K769 Liver disease, unspecified: Secondary | ICD-10-CM | POA: Diagnosis not present

## 2020-03-09 DIAGNOSIS — E440 Moderate protein-calorie malnutrition: Secondary | ICD-10-CM | POA: Diagnosis not present

## 2020-03-10 DIAGNOSIS — N2581 Secondary hyperparathyroidism of renal origin: Secondary | ICD-10-CM | POA: Diagnosis not present

## 2020-03-10 DIAGNOSIS — Z4932 Encounter for adequacy testing for peritoneal dialysis: Secondary | ICD-10-CM | POA: Diagnosis not present

## 2020-03-10 DIAGNOSIS — K769 Liver disease, unspecified: Secondary | ICD-10-CM | POA: Diagnosis not present

## 2020-03-10 DIAGNOSIS — E440 Moderate protein-calorie malnutrition: Secondary | ICD-10-CM | POA: Diagnosis not present

## 2020-03-10 DIAGNOSIS — R82998 Other abnormal findings in urine: Secondary | ICD-10-CM | POA: Diagnosis not present

## 2020-03-10 DIAGNOSIS — Z79899 Other long term (current) drug therapy: Secondary | ICD-10-CM | POA: Diagnosis not present

## 2020-03-10 DIAGNOSIS — Z992 Dependence on renal dialysis: Secondary | ICD-10-CM | POA: Diagnosis not present

## 2020-03-10 DIAGNOSIS — D509 Iron deficiency anemia, unspecified: Secondary | ICD-10-CM | POA: Diagnosis not present

## 2020-03-10 DIAGNOSIS — N186 End stage renal disease: Secondary | ICD-10-CM | POA: Diagnosis not present

## 2020-03-10 DIAGNOSIS — D631 Anemia in chronic kidney disease: Secondary | ICD-10-CM | POA: Diagnosis not present

## 2020-03-11 DIAGNOSIS — E440 Moderate protein-calorie malnutrition: Secondary | ICD-10-CM | POA: Diagnosis not present

## 2020-03-11 DIAGNOSIS — N2581 Secondary hyperparathyroidism of renal origin: Secondary | ICD-10-CM | POA: Diagnosis not present

## 2020-03-11 DIAGNOSIS — D631 Anemia in chronic kidney disease: Secondary | ICD-10-CM | POA: Diagnosis not present

## 2020-03-11 DIAGNOSIS — K769 Liver disease, unspecified: Secondary | ICD-10-CM | POA: Diagnosis not present

## 2020-03-11 DIAGNOSIS — N186 End stage renal disease: Secondary | ICD-10-CM | POA: Diagnosis not present

## 2020-03-11 DIAGNOSIS — Z4932 Encounter for adequacy testing for peritoneal dialysis: Secondary | ICD-10-CM | POA: Diagnosis not present

## 2020-03-11 DIAGNOSIS — Z79899 Other long term (current) drug therapy: Secondary | ICD-10-CM | POA: Diagnosis not present

## 2020-03-11 DIAGNOSIS — Z992 Dependence on renal dialysis: Secondary | ICD-10-CM | POA: Diagnosis not present

## 2020-03-11 DIAGNOSIS — D509 Iron deficiency anemia, unspecified: Secondary | ICD-10-CM | POA: Diagnosis not present

## 2020-03-11 DIAGNOSIS — R82998 Other abnormal findings in urine: Secondary | ICD-10-CM | POA: Diagnosis not present

## 2020-03-13 DIAGNOSIS — N186 End stage renal disease: Secondary | ICD-10-CM | POA: Diagnosis not present

## 2020-03-13 DIAGNOSIS — Z992 Dependence on renal dialysis: Secondary | ICD-10-CM | POA: Diagnosis not present

## 2020-03-13 DIAGNOSIS — D689 Coagulation defect, unspecified: Secondary | ICD-10-CM | POA: Diagnosis not present

## 2020-03-16 ENCOUNTER — Ambulatory Visit: Payer: Medicaid Other | Admitting: Allergy & Immunology

## 2020-03-16 DIAGNOSIS — D509 Iron deficiency anemia, unspecified: Secondary | ICD-10-CM | POA: Diagnosis not present

## 2020-03-16 DIAGNOSIS — N186 End stage renal disease: Secondary | ICD-10-CM | POA: Diagnosis not present

## 2020-03-16 DIAGNOSIS — Z992 Dependence on renal dialysis: Secondary | ICD-10-CM | POA: Diagnosis not present

## 2020-03-16 DIAGNOSIS — D689 Coagulation defect, unspecified: Secondary | ICD-10-CM | POA: Diagnosis not present

## 2020-03-17 DIAGNOSIS — N186 End stage renal disease: Secondary | ICD-10-CM | POA: Diagnosis not present

## 2020-03-17 DIAGNOSIS — I871 Compression of vein: Secondary | ICD-10-CM | POA: Diagnosis not present

## 2020-03-17 DIAGNOSIS — Z992 Dependence on renal dialysis: Secondary | ICD-10-CM | POA: Diagnosis not present

## 2020-03-17 DIAGNOSIS — T82858A Stenosis of vascular prosthetic devices, implants and grafts, initial encounter: Secondary | ICD-10-CM | POA: Diagnosis not present

## 2020-03-18 DIAGNOSIS — Z992 Dependence on renal dialysis: Secondary | ICD-10-CM | POA: Diagnosis not present

## 2020-03-18 DIAGNOSIS — N186 End stage renal disease: Secondary | ICD-10-CM | POA: Diagnosis not present

## 2020-03-18 DIAGNOSIS — D509 Iron deficiency anemia, unspecified: Secondary | ICD-10-CM | POA: Diagnosis not present

## 2020-03-18 DIAGNOSIS — D689 Coagulation defect, unspecified: Secondary | ICD-10-CM | POA: Diagnosis not present

## 2020-03-20 DIAGNOSIS — D689 Coagulation defect, unspecified: Secondary | ICD-10-CM | POA: Diagnosis not present

## 2020-03-20 DIAGNOSIS — D509 Iron deficiency anemia, unspecified: Secondary | ICD-10-CM | POA: Diagnosis not present

## 2020-03-20 DIAGNOSIS — N186 End stage renal disease: Secondary | ICD-10-CM | POA: Diagnosis not present

## 2020-03-20 DIAGNOSIS — Z992 Dependence on renal dialysis: Secondary | ICD-10-CM | POA: Diagnosis not present

## 2020-03-23 DIAGNOSIS — N186 End stage renal disease: Secondary | ICD-10-CM | POA: Diagnosis not present

## 2020-03-23 DIAGNOSIS — Z992 Dependence on renal dialysis: Secondary | ICD-10-CM | POA: Diagnosis not present

## 2020-03-23 DIAGNOSIS — D689 Coagulation defect, unspecified: Secondary | ICD-10-CM | POA: Diagnosis not present

## 2020-03-24 DIAGNOSIS — Z20822 Contact with and (suspected) exposure to covid-19: Secondary | ICD-10-CM | POA: Diagnosis not present

## 2020-03-24 DIAGNOSIS — Z992 Dependence on renal dialysis: Secondary | ICD-10-CM | POA: Diagnosis not present

## 2020-03-24 DIAGNOSIS — Z01818 Encounter for other preprocedural examination: Secondary | ICD-10-CM | POA: Diagnosis not present

## 2020-03-24 DIAGNOSIS — N186 End stage renal disease: Secondary | ICD-10-CM | POA: Diagnosis not present

## 2020-03-25 DIAGNOSIS — D689 Coagulation defect, unspecified: Secondary | ICD-10-CM | POA: Diagnosis not present

## 2020-03-25 DIAGNOSIS — Z992 Dependence on renal dialysis: Secondary | ICD-10-CM | POA: Diagnosis not present

## 2020-03-25 DIAGNOSIS — N186 End stage renal disease: Secondary | ICD-10-CM | POA: Diagnosis not present

## 2020-03-30 DIAGNOSIS — D689 Coagulation defect, unspecified: Secondary | ICD-10-CM | POA: Diagnosis not present

## 2020-03-30 DIAGNOSIS — D631 Anemia in chronic kidney disease: Secondary | ICD-10-CM | POA: Diagnosis not present

## 2020-03-30 DIAGNOSIS — N186 End stage renal disease: Secondary | ICD-10-CM | POA: Diagnosis not present

## 2020-03-30 DIAGNOSIS — Z992 Dependence on renal dialysis: Secondary | ICD-10-CM | POA: Diagnosis not present

## 2020-03-31 DIAGNOSIS — Z79899 Other long term (current) drug therapy: Secondary | ICD-10-CM | POA: Diagnosis not present

## 2020-03-31 DIAGNOSIS — Z992 Dependence on renal dialysis: Secondary | ICD-10-CM | POA: Diagnosis not present

## 2020-03-31 DIAGNOSIS — Z4902 Encounter for fitting and adjustment of peritoneal dialysis catheter: Secondary | ICD-10-CM | POA: Diagnosis not present

## 2020-03-31 DIAGNOSIS — Z9884 Bariatric surgery status: Secondary | ICD-10-CM | POA: Diagnosis not present

## 2020-03-31 DIAGNOSIS — I132 Hypertensive heart and chronic kidney disease with heart failure and with stage 5 chronic kidney disease, or end stage renal disease: Secondary | ICD-10-CM | POA: Diagnosis not present

## 2020-03-31 DIAGNOSIS — J449 Chronic obstructive pulmonary disease, unspecified: Secondary | ICD-10-CM | POA: Diagnosis not present

## 2020-03-31 DIAGNOSIS — N186 End stage renal disease: Secondary | ICD-10-CM | POA: Diagnosis not present

## 2020-03-31 DIAGNOSIS — I5022 Chronic systolic (congestive) heart failure: Secondary | ICD-10-CM | POA: Diagnosis not present

## 2020-04-01 DIAGNOSIS — Z992 Dependence on renal dialysis: Secondary | ICD-10-CM | POA: Diagnosis not present

## 2020-04-01 DIAGNOSIS — N186 End stage renal disease: Secondary | ICD-10-CM | POA: Diagnosis not present

## 2020-04-01 DIAGNOSIS — I129 Hypertensive chronic kidney disease with stage 1 through stage 4 chronic kidney disease, or unspecified chronic kidney disease: Secondary | ICD-10-CM | POA: Diagnosis not present

## 2020-04-01 DIAGNOSIS — D631 Anemia in chronic kidney disease: Secondary | ICD-10-CM | POA: Diagnosis not present

## 2020-04-01 DIAGNOSIS — D689 Coagulation defect, unspecified: Secondary | ICD-10-CM | POA: Diagnosis not present

## 2020-04-05 DIAGNOSIS — N2581 Secondary hyperparathyroidism of renal origin: Secondary | ICD-10-CM | POA: Diagnosis not present

## 2020-04-05 DIAGNOSIS — D689 Coagulation defect, unspecified: Secondary | ICD-10-CM | POA: Diagnosis not present

## 2020-04-05 DIAGNOSIS — Z992 Dependence on renal dialysis: Secondary | ICD-10-CM | POA: Diagnosis not present

## 2020-04-05 DIAGNOSIS — N186 End stage renal disease: Secondary | ICD-10-CM | POA: Diagnosis not present

## 2020-04-06 DIAGNOSIS — I871 Compression of vein: Secondary | ICD-10-CM | POA: Diagnosis not present

## 2020-04-06 DIAGNOSIS — D689 Coagulation defect, unspecified: Secondary | ICD-10-CM | POA: Diagnosis not present

## 2020-04-06 DIAGNOSIS — N186 End stage renal disease: Secondary | ICD-10-CM | POA: Diagnosis not present

## 2020-04-06 DIAGNOSIS — N2581 Secondary hyperparathyroidism of renal origin: Secondary | ICD-10-CM | POA: Diagnosis not present

## 2020-04-06 DIAGNOSIS — Z992 Dependence on renal dialysis: Secondary | ICD-10-CM | POA: Diagnosis not present

## 2020-04-06 DIAGNOSIS — T82858A Stenosis of vascular prosthetic devices, implants and grafts, initial encounter: Secondary | ICD-10-CM | POA: Diagnosis not present

## 2020-04-07 DIAGNOSIS — N186 End stage renal disease: Secondary | ICD-10-CM | POA: Diagnosis not present

## 2020-04-07 DIAGNOSIS — D689 Coagulation defect, unspecified: Secondary | ICD-10-CM | POA: Diagnosis not present

## 2020-04-07 DIAGNOSIS — Z992 Dependence on renal dialysis: Secondary | ICD-10-CM | POA: Diagnosis not present

## 2020-04-07 DIAGNOSIS — N2581 Secondary hyperparathyroidism of renal origin: Secondary | ICD-10-CM | POA: Diagnosis not present

## 2020-04-09 DIAGNOSIS — Z992 Dependence on renal dialysis: Secondary | ICD-10-CM | POA: Diagnosis not present

## 2020-04-09 DIAGNOSIS — D689 Coagulation defect, unspecified: Secondary | ICD-10-CM | POA: Diagnosis not present

## 2020-04-09 DIAGNOSIS — N186 End stage renal disease: Secondary | ICD-10-CM | POA: Diagnosis not present

## 2020-04-09 DIAGNOSIS — N2581 Secondary hyperparathyroidism of renal origin: Secondary | ICD-10-CM | POA: Diagnosis not present

## 2020-04-12 DIAGNOSIS — D631 Anemia in chronic kidney disease: Secondary | ICD-10-CM | POA: Diagnosis not present

## 2020-04-12 DIAGNOSIS — Z992 Dependence on renal dialysis: Secondary | ICD-10-CM | POA: Diagnosis not present

## 2020-04-12 DIAGNOSIS — N2581 Secondary hyperparathyroidism of renal origin: Secondary | ICD-10-CM | POA: Diagnosis not present

## 2020-04-12 DIAGNOSIS — N186 End stage renal disease: Secondary | ICD-10-CM | POA: Diagnosis not present

## 2020-04-12 DIAGNOSIS — D689 Coagulation defect, unspecified: Secondary | ICD-10-CM | POA: Diagnosis not present

## 2020-04-13 ENCOUNTER — Ambulatory Visit: Payer: Medicare Other | Admitting: Allergy & Immunology

## 2020-04-13 ENCOUNTER — Telehealth: Payer: Self-pay

## 2020-04-13 NOTE — Telephone Encounter (Signed)
Called and left a message for patient to see if he wanted to reschedule his missed appointment.

## 2020-04-14 DIAGNOSIS — N186 End stage renal disease: Secondary | ICD-10-CM | POA: Diagnosis not present

## 2020-04-14 DIAGNOSIS — N2581 Secondary hyperparathyroidism of renal origin: Secondary | ICD-10-CM | POA: Diagnosis not present

## 2020-04-14 DIAGNOSIS — D631 Anemia in chronic kidney disease: Secondary | ICD-10-CM | POA: Diagnosis not present

## 2020-04-14 DIAGNOSIS — D689 Coagulation defect, unspecified: Secondary | ICD-10-CM | POA: Diagnosis not present

## 2020-04-14 DIAGNOSIS — Z992 Dependence on renal dialysis: Secondary | ICD-10-CM | POA: Diagnosis not present

## 2020-04-16 DIAGNOSIS — N2581 Secondary hyperparathyroidism of renal origin: Secondary | ICD-10-CM | POA: Diagnosis not present

## 2020-04-16 DIAGNOSIS — Z992 Dependence on renal dialysis: Secondary | ICD-10-CM | POA: Diagnosis not present

## 2020-04-16 DIAGNOSIS — N186 End stage renal disease: Secondary | ICD-10-CM | POA: Diagnosis not present

## 2020-04-16 DIAGNOSIS — D631 Anemia in chronic kidney disease: Secondary | ICD-10-CM | POA: Diagnosis not present

## 2020-04-16 DIAGNOSIS — D689 Coagulation defect, unspecified: Secondary | ICD-10-CM | POA: Diagnosis not present

## 2020-04-19 DIAGNOSIS — N186 End stage renal disease: Secondary | ICD-10-CM | POA: Diagnosis not present

## 2020-04-19 DIAGNOSIS — Z992 Dependence on renal dialysis: Secondary | ICD-10-CM | POA: Diagnosis not present

## 2020-04-19 DIAGNOSIS — N2581 Secondary hyperparathyroidism of renal origin: Secondary | ICD-10-CM | POA: Diagnosis not present

## 2020-04-19 DIAGNOSIS — D689 Coagulation defect, unspecified: Secondary | ICD-10-CM | POA: Diagnosis not present

## 2020-04-21 DIAGNOSIS — N186 End stage renal disease: Secondary | ICD-10-CM | POA: Diagnosis not present

## 2020-04-21 DIAGNOSIS — D689 Coagulation defect, unspecified: Secondary | ICD-10-CM | POA: Diagnosis not present

## 2020-04-21 DIAGNOSIS — N2581 Secondary hyperparathyroidism of renal origin: Secondary | ICD-10-CM | POA: Diagnosis not present

## 2020-04-21 DIAGNOSIS — Z992 Dependence on renal dialysis: Secondary | ICD-10-CM | POA: Diagnosis not present

## 2020-04-22 DIAGNOSIS — R3915 Urgency of urination: Secondary | ICD-10-CM | POA: Diagnosis not present

## 2020-04-23 ENCOUNTER — Other Ambulatory Visit: Payer: Self-pay | Admitting: Urology

## 2020-04-23 DIAGNOSIS — N186 End stage renal disease: Secondary | ICD-10-CM | POA: Diagnosis not present

## 2020-04-23 DIAGNOSIS — D689 Coagulation defect, unspecified: Secondary | ICD-10-CM | POA: Diagnosis not present

## 2020-04-23 DIAGNOSIS — N2581 Secondary hyperparathyroidism of renal origin: Secondary | ICD-10-CM | POA: Diagnosis not present

## 2020-04-23 DIAGNOSIS — Z992 Dependence on renal dialysis: Secondary | ICD-10-CM | POA: Diagnosis not present

## 2020-04-26 DIAGNOSIS — D689 Coagulation defect, unspecified: Secondary | ICD-10-CM | POA: Diagnosis not present

## 2020-04-26 DIAGNOSIS — D631 Anemia in chronic kidney disease: Secondary | ICD-10-CM | POA: Diagnosis not present

## 2020-04-26 DIAGNOSIS — D509 Iron deficiency anemia, unspecified: Secondary | ICD-10-CM | POA: Diagnosis not present

## 2020-04-26 DIAGNOSIS — N2581 Secondary hyperparathyroidism of renal origin: Secondary | ICD-10-CM | POA: Diagnosis not present

## 2020-04-26 DIAGNOSIS — N186 End stage renal disease: Secondary | ICD-10-CM | POA: Diagnosis not present

## 2020-04-26 DIAGNOSIS — Z992 Dependence on renal dialysis: Secondary | ICD-10-CM | POA: Diagnosis not present

## 2020-04-28 DIAGNOSIS — Z992 Dependence on renal dialysis: Secondary | ICD-10-CM | POA: Diagnosis not present

## 2020-04-28 DIAGNOSIS — N2581 Secondary hyperparathyroidism of renal origin: Secondary | ICD-10-CM | POA: Diagnosis not present

## 2020-04-28 DIAGNOSIS — D509 Iron deficiency anemia, unspecified: Secondary | ICD-10-CM | POA: Diagnosis not present

## 2020-04-28 DIAGNOSIS — N186 End stage renal disease: Secondary | ICD-10-CM | POA: Diagnosis not present

## 2020-04-28 DIAGNOSIS — D631 Anemia in chronic kidney disease: Secondary | ICD-10-CM | POA: Diagnosis not present

## 2020-04-28 DIAGNOSIS — D689 Coagulation defect, unspecified: Secondary | ICD-10-CM | POA: Diagnosis not present

## 2020-04-30 DIAGNOSIS — N2581 Secondary hyperparathyroidism of renal origin: Secondary | ICD-10-CM | POA: Diagnosis not present

## 2020-04-30 DIAGNOSIS — D689 Coagulation defect, unspecified: Secondary | ICD-10-CM | POA: Diagnosis not present

## 2020-04-30 DIAGNOSIS — N186 End stage renal disease: Secondary | ICD-10-CM | POA: Diagnosis not present

## 2020-04-30 DIAGNOSIS — D631 Anemia in chronic kidney disease: Secondary | ICD-10-CM | POA: Diagnosis not present

## 2020-04-30 DIAGNOSIS — D509 Iron deficiency anemia, unspecified: Secondary | ICD-10-CM | POA: Diagnosis not present

## 2020-04-30 DIAGNOSIS — Z992 Dependence on renal dialysis: Secondary | ICD-10-CM | POA: Diagnosis not present

## 2020-05-01 DIAGNOSIS — I129 Hypertensive chronic kidney disease with stage 1 through stage 4 chronic kidney disease, or unspecified chronic kidney disease: Secondary | ICD-10-CM | POA: Diagnosis not present

## 2020-05-01 DIAGNOSIS — Z992 Dependence on renal dialysis: Secondary | ICD-10-CM | POA: Diagnosis not present

## 2020-05-01 DIAGNOSIS — N186 End stage renal disease: Secondary | ICD-10-CM | POA: Diagnosis not present

## 2020-05-03 DIAGNOSIS — N186 End stage renal disease: Secondary | ICD-10-CM | POA: Diagnosis not present

## 2020-05-03 DIAGNOSIS — N2581 Secondary hyperparathyroidism of renal origin: Secondary | ICD-10-CM | POA: Diagnosis not present

## 2020-05-03 DIAGNOSIS — D689 Coagulation defect, unspecified: Secondary | ICD-10-CM | POA: Diagnosis not present

## 2020-05-03 DIAGNOSIS — D509 Iron deficiency anemia, unspecified: Secondary | ICD-10-CM | POA: Diagnosis not present

## 2020-05-03 DIAGNOSIS — Z992 Dependence on renal dialysis: Secondary | ICD-10-CM | POA: Diagnosis not present

## 2020-05-05 DIAGNOSIS — N186 End stage renal disease: Secondary | ICD-10-CM | POA: Diagnosis not present

## 2020-05-05 DIAGNOSIS — N2581 Secondary hyperparathyroidism of renal origin: Secondary | ICD-10-CM | POA: Diagnosis not present

## 2020-05-05 DIAGNOSIS — D509 Iron deficiency anemia, unspecified: Secondary | ICD-10-CM | POA: Diagnosis not present

## 2020-05-05 DIAGNOSIS — Z992 Dependence on renal dialysis: Secondary | ICD-10-CM | POA: Diagnosis not present

## 2020-05-05 DIAGNOSIS — D689 Coagulation defect, unspecified: Secondary | ICD-10-CM | POA: Diagnosis not present

## 2020-05-05 NOTE — Progress Notes (Signed)
Reviewing pt chart for pre-op interview for surgery scheduled on 05-11-2020 for Dr Lovena Neighbours.  Noted extensive cardiac hx w/ CHF, NICM, s/p MVR, OSA, and ESRD on hemodialysis.  Last cardiology/ CHF clinic visit 12-03-2019.  Pt will need clearance , pt takes ASA 81 bid.  Called and spoke w/ Coni, [place] scheduler for Dr Lovena Neighbours, informed her of needing clearance.

## 2020-05-06 ENCOUNTER — Other Ambulatory Visit: Payer: Self-pay

## 2020-05-06 ENCOUNTER — Telehealth (HOSPITAL_COMMUNITY): Payer: Self-pay | Admitting: *Deleted

## 2020-05-06 ENCOUNTER — Encounter (HOSPITAL_BASED_OUTPATIENT_CLINIC_OR_DEPARTMENT_OTHER): Payer: Self-pay | Admitting: Urology

## 2020-05-06 NOTE — Progress Notes (Addendum)
Spoke w/ via phone for pre-op interview--- PT Lab needs dos---- CBC, BMP, PT/ INR              Lab results------ current in epic/ chart COVID test ------ 05-07-2020 @ 0930  Arrive at ------- 1000 on 05-11-2020 NPO after MN NO Solid Food.  Clear liquids from MN until--- 0900 Med rec completed Medications to take morning of surgery ----- Lipitor, Coreg, Flomax  Diabetic medication ----- n/a Patient instructed to bring photo id and insurance card day of surgery Patient aware to have Driver (ride ) / caregiver    for 24 hours after surgery -- wife, Roxana Patient Special Instructions ----- reviewed [facility] and visitor guidelines.  Asked pt to bring his two inhalers dos  Pre-Op special Istructions ----- pt has cardiac clearance from Dr Aundra Dubin dated 05-06-2020 faxed from dr winter office, placed in chart  Patient verbalized understanding of instructions that were given at this phone interview. Patient denies shortness of breath, chest pain, fever, cough at this phone interview.   Anesthesia Review: HTN;  Chronic systolic CHF;  NICM last ef 45% per [facility] 03/ 2021;  S/p MVR due to severe stenosis 06/ 2018;  Seasonal asthma;  ESRD on hemodialysis;  S/p gastric bypass 2014;  Pt denies any cardiac s&s, sob, and no peripheral swelling.  PCP:  Dr Lucilla Lame Cardiologist : Dr Aundra Dubin [facility] 12-03-2019 epic) Nephrologist:  Dr Moshe Cipro (HD @ [facility] [address] in [place]) [place] transplant clinic:  Dr Lissa Merlin (lov 11-14-2019 care everywhere) Pulmonology:  Dr Elsworth Soho (lov 12-17-2019 epic) Allergy & Asthma center:  Dr Ernst Bowler (lov 02-03-2020)  Chest x-ray :  12-17-2019 epic EKG : 12-03-2019 epic Echo : [facility] 03-06-2019 Stress test:  01-06-2016 epic CPX:  11-18-2019 epic Cardiac Cath :  06-07-2016 epic Activity level: denies sob w/ any activity Sleep Study/ CPAP :  NO  Blood Thinner/ Instructions Maryjane Hurter Dose:  NO ASA / Instructions/ Last Dose : ASA 81mg  twice daily/  Pt has cardiac clearance to  stop 5 days prior to surgery , last dose evening  05-06-2018

## 2020-05-06 NOTE — Telephone Encounter (Signed)
Received fax from [facility], pt needs clearance for a bipolar turp with cystolitholopaxy on 5/10 with Dr Lovena Neighbours.  Per Dr Aundra Dubin: "ok for surgery"  Note faxed back to them at [phone]

## 2020-05-07 ENCOUNTER — Other Ambulatory Visit (HOSPITAL_COMMUNITY)
Admission: RE | Admit: 2020-05-07 | Discharge: 2020-05-07 | Disposition: A | Discharge status: Home / Self Care | Payer: Medicare Other | Source: Ambulatory Visit | Attending: Urology | Admitting: Urology

## 2020-05-07 DIAGNOSIS — Z20822 Contact with and (suspected) exposure to covid-19: Secondary | ICD-10-CM | POA: Diagnosis not present

## 2020-05-07 DIAGNOSIS — D689 Coagulation defect, unspecified: Secondary | ICD-10-CM | POA: Diagnosis not present

## 2020-05-07 DIAGNOSIS — Z01812 Encounter for preprocedural laboratory examination: Secondary | ICD-10-CM | POA: Diagnosis not present

## 2020-05-07 DIAGNOSIS — N186 End stage renal disease: Secondary | ICD-10-CM | POA: Diagnosis not present

## 2020-05-07 DIAGNOSIS — D509 Iron deficiency anemia, unspecified: Secondary | ICD-10-CM | POA: Diagnosis not present

## 2020-05-07 DIAGNOSIS — N2581 Secondary hyperparathyroidism of renal origin: Secondary | ICD-10-CM | POA: Diagnosis not present

## 2020-05-07 DIAGNOSIS — Z992 Dependence on renal dialysis: Secondary | ICD-10-CM | POA: Diagnosis not present

## 2020-05-07 LAB — SARS CORONAVIRUS 2 (TAT 6-24 HRS): SARS Coronavirus 2: NEGATIVE

## 2020-05-10 DIAGNOSIS — Z992 Dependence on renal dialysis: Secondary | ICD-10-CM | POA: Diagnosis not present

## 2020-05-10 DIAGNOSIS — D689 Coagulation defect, unspecified: Secondary | ICD-10-CM | POA: Diagnosis not present

## 2020-05-10 DIAGNOSIS — D509 Iron deficiency anemia, unspecified: Secondary | ICD-10-CM | POA: Diagnosis not present

## 2020-05-10 DIAGNOSIS — N186 End stage renal disease: Secondary | ICD-10-CM | POA: Diagnosis not present

## 2020-05-10 DIAGNOSIS — N2581 Secondary hyperparathyroidism of renal origin: Secondary | ICD-10-CM | POA: Diagnosis not present

## 2020-05-10 DIAGNOSIS — D631 Anemia in chronic kidney disease: Secondary | ICD-10-CM | POA: Diagnosis not present

## 2020-05-10 NOTE — Progress Notes (Signed)
Spoke with dr Herbie Baltimore fitzgerald mda and reviewed patient history and has cardiac clearance for 05-11-2020 surgeyr, pt ok for [facility] per dr Herbie Baltimore fitzgerald barring any acute status changes

## 2020-05-11 DIAGNOSIS — L2084 Intrinsic (allergic) eczema: Secondary | ICD-10-CM | POA: Diagnosis not present

## 2020-05-12 DIAGNOSIS — Z992 Dependence on renal dialysis: Secondary | ICD-10-CM | POA: Diagnosis not present

## 2020-05-12 DIAGNOSIS — D509 Iron deficiency anemia, unspecified: Secondary | ICD-10-CM | POA: Diagnosis not present

## 2020-05-12 DIAGNOSIS — N186 End stage renal disease: Secondary | ICD-10-CM | POA: Diagnosis not present

## 2020-05-12 DIAGNOSIS — D689 Coagulation defect, unspecified: Secondary | ICD-10-CM | POA: Diagnosis not present

## 2020-05-12 DIAGNOSIS — D631 Anemia in chronic kidney disease: Secondary | ICD-10-CM | POA: Diagnosis not present

## 2020-05-12 DIAGNOSIS — N2581 Secondary hyperparathyroidism of renal origin: Secondary | ICD-10-CM | POA: Diagnosis not present

## 2020-05-14 DIAGNOSIS — D689 Coagulation defect, unspecified: Secondary | ICD-10-CM | POA: Diagnosis not present

## 2020-05-14 DIAGNOSIS — D509 Iron deficiency anemia, unspecified: Secondary | ICD-10-CM | POA: Diagnosis not present

## 2020-05-14 DIAGNOSIS — N186 End stage renal disease: Secondary | ICD-10-CM | POA: Diagnosis not present

## 2020-05-14 DIAGNOSIS — Z992 Dependence on renal dialysis: Secondary | ICD-10-CM | POA: Diagnosis not present

## 2020-05-14 DIAGNOSIS — D631 Anemia in chronic kidney disease: Secondary | ICD-10-CM | POA: Diagnosis not present

## 2020-05-14 DIAGNOSIS — N2581 Secondary hyperparathyroidism of renal origin: Secondary | ICD-10-CM | POA: Diagnosis not present

## 2020-05-17 DIAGNOSIS — Z992 Dependence on renal dialysis: Secondary | ICD-10-CM | POA: Diagnosis not present

## 2020-05-17 DIAGNOSIS — D689 Coagulation defect, unspecified: Secondary | ICD-10-CM | POA: Diagnosis not present

## 2020-05-17 DIAGNOSIS — N186 End stage renal disease: Secondary | ICD-10-CM | POA: Diagnosis not present

## 2020-05-17 DIAGNOSIS — N2581 Secondary hyperparathyroidism of renal origin: Secondary | ICD-10-CM | POA: Diagnosis not present

## 2020-05-17 DIAGNOSIS — D509 Iron deficiency anemia, unspecified: Secondary | ICD-10-CM | POA: Diagnosis not present

## 2020-05-19 DIAGNOSIS — Z992 Dependence on renal dialysis: Secondary | ICD-10-CM | POA: Diagnosis not present

## 2020-05-19 DIAGNOSIS — D509 Iron deficiency anemia, unspecified: Secondary | ICD-10-CM | POA: Diagnosis not present

## 2020-05-19 DIAGNOSIS — N2581 Secondary hyperparathyroidism of renal origin: Secondary | ICD-10-CM | POA: Diagnosis not present

## 2020-05-19 DIAGNOSIS — D689 Coagulation defect, unspecified: Secondary | ICD-10-CM | POA: Diagnosis not present

## 2020-05-19 DIAGNOSIS — N186 End stage renal disease: Secondary | ICD-10-CM | POA: Diagnosis not present

## 2020-05-19 NOTE — Progress Notes (Addendum)
COVID Vaccine Completed: No Date COVID Vaccine completed:N/A Has received booster:N/A COVID vaccine manufacturer: N/A Date of COVID positive in last 90 days: No  PCP - [name], MD Cardiologist - Dr. Loralie Champagne, clearance telephone encounter 05/06/2020 in epic Nephr-Dr. [facility]  Chest x-ray - 12/17/19 in epic EKG - 12/03/19 in epic Stress Test - N/A ECHO - 03/06/19 in epic Cardiac Cath - N/A Pacemaker/ICD device last checked: N/A  Sleep Study - Yes CPAP - No issues since bariatric procedure  Fasting Blood Sugar - N/A Checks Blood Sugar __N/A___ times a day  Blood Thinner Instructions: N/A Aspirin Instructions: Yes Last Dose: May 18, 2020  Activity level:  Can go up a flight of stairs and activities of daily living without stopping and without symptoms    Anesthesia review: ESRD/Dialysis, CHF, History of CVA, Mitral Valve repair,   Patient denies shortness of breath, fever, cough and chest pain at PAT appointment   Patient verbalized understanding of instructions that were given to them at the PAT appointment. Patient was also instructed that they will need to review over the PAT instructions again at home before surgery.

## 2020-05-20 ENCOUNTER — Other Ambulatory Visit: Payer: Self-pay

## 2020-05-20 ENCOUNTER — Encounter (HOSPITAL_COMMUNITY): Payer: Self-pay | Admitting: Urology

## 2020-05-21 ENCOUNTER — Other Ambulatory Visit (HOSPITAL_COMMUNITY)
Admission: RE | Admit: 2020-05-21 | Discharge: 2020-05-21 | Disposition: A | Discharge status: Home / Self Care | Payer: Medicare Other | Source: Ambulatory Visit | Attending: Urology | Admitting: Urology

## 2020-05-21 DIAGNOSIS — N2581 Secondary hyperparathyroidism of renal origin: Secondary | ICD-10-CM | POA: Diagnosis not present

## 2020-05-21 DIAGNOSIS — Z992 Dependence on renal dialysis: Secondary | ICD-10-CM | POA: Diagnosis not present

## 2020-05-21 DIAGNOSIS — N186 End stage renal disease: Secondary | ICD-10-CM | POA: Diagnosis not present

## 2020-05-21 DIAGNOSIS — Z20822 Contact with and (suspected) exposure to covid-19: Secondary | ICD-10-CM | POA: Insufficient documentation

## 2020-05-21 DIAGNOSIS — Z01812 Encounter for preprocedural laboratory examination: Secondary | ICD-10-CM | POA: Insufficient documentation

## 2020-05-21 DIAGNOSIS — D509 Iron deficiency anemia, unspecified: Secondary | ICD-10-CM | POA: Diagnosis not present

## 2020-05-21 DIAGNOSIS — D689 Coagulation defect, unspecified: Secondary | ICD-10-CM | POA: Diagnosis not present

## 2020-05-21 LAB — SARS CORONAVIRUS 2 (TAT 6-24 HRS): SARS Coronavirus 2: NEGATIVE

## 2020-05-24 DIAGNOSIS — Z992 Dependence on renal dialysis: Secondary | ICD-10-CM | POA: Diagnosis not present

## 2020-05-24 DIAGNOSIS — D689 Coagulation defect, unspecified: Secondary | ICD-10-CM | POA: Diagnosis not present

## 2020-05-24 DIAGNOSIS — N186 End stage renal disease: Secondary | ICD-10-CM | POA: Diagnosis not present

## 2020-05-24 DIAGNOSIS — N2581 Secondary hyperparathyroidism of renal origin: Secondary | ICD-10-CM | POA: Diagnosis not present

## 2020-05-24 DIAGNOSIS — D509 Iron deficiency anemia, unspecified: Secondary | ICD-10-CM | POA: Diagnosis not present

## 2020-05-25 ENCOUNTER — Ambulatory Visit (HOSPITAL_COMMUNITY): Payer: Medicare Other | Admitting: Physician Assistant

## 2020-05-25 ENCOUNTER — Other Ambulatory Visit: Payer: Self-pay

## 2020-05-25 ENCOUNTER — Encounter (HOSPITAL_COMMUNITY): Payer: Self-pay | Admitting: Urology

## 2020-05-25 ENCOUNTER — Encounter (HOSPITAL_COMMUNITY)
Admission: RE | Disposition: A | Discharge status: Home / Self Care | Payer: Self-pay | Source: Home / Self Care | Attending: Urology

## 2020-05-25 ENCOUNTER — Observation Stay (HOSPITAL_COMMUNITY)
Admission: RE | Admit: 2020-05-25 | Discharge: 2020-05-26 | Disposition: A | Discharge status: Home / Self Care | Payer: Medicare Other | Attending: Urology | Admitting: Urology

## 2020-05-25 DIAGNOSIS — I132 Hypertensive heart and chronic kidney disease with heart failure and with stage 5 chronic kidney disease, or end stage renal disease: Secondary | ICD-10-CM | POA: Diagnosis not present

## 2020-05-25 DIAGNOSIS — N138 Other obstructive and reflux uropathy: Secondary | ICD-10-CM | POA: Diagnosis not present

## 2020-05-25 DIAGNOSIS — Z992 Dependence on renal dialysis: Secondary | ICD-10-CM | POA: Insufficient documentation

## 2020-05-25 DIAGNOSIS — N210 Calculus in bladder: Secondary | ICD-10-CM | POA: Insufficient documentation

## 2020-05-25 DIAGNOSIS — R3916 Straining to void: Secondary | ICD-10-CM | POA: Diagnosis not present

## 2020-05-25 DIAGNOSIS — J45909 Unspecified asthma, uncomplicated: Secondary | ICD-10-CM | POA: Diagnosis not present

## 2020-05-25 DIAGNOSIS — I5022 Chronic systolic (congestive) heart failure: Secondary | ICD-10-CM | POA: Insufficient documentation

## 2020-05-25 DIAGNOSIS — N186 End stage renal disease: Secondary | ICD-10-CM | POA: Diagnosis not present

## 2020-05-25 DIAGNOSIS — Z96651 Presence of right artificial knee joint: Secondary | ICD-10-CM | POA: Diagnosis not present

## 2020-05-25 DIAGNOSIS — N320 Bladder-neck obstruction: Secondary | ICD-10-CM | POA: Diagnosis not present

## 2020-05-25 DIAGNOSIS — T83098A Other mechanical complication of other indwelling urethral catheter, initial encounter: Secondary | ICD-10-CM | POA: Diagnosis not present

## 2020-05-25 DIAGNOSIS — N401 Enlarged prostate with lower urinary tract symptoms: Secondary | ICD-10-CM | POA: Diagnosis not present

## 2020-05-25 HISTORY — DX: Benign prostatic hyperplasia with lower urinary tract symptoms: N40.1

## 2020-05-25 HISTORY — DX: Personal history of transient ischemic attack (TIA), and cerebral infarction without residual deficits: Z86.73

## 2020-05-25 HISTORY — DX: Anemia in chronic kidney disease: D63.1

## 2020-05-25 HISTORY — PX: TRANSURETHRAL RESECTION OF PROSTATE: SHX73

## 2020-05-25 HISTORY — DX: Personal history of other diseases of urinary system: Z87.448

## 2020-05-25 HISTORY — DX: Other cardiomyopathies: I42.8

## 2020-05-25 HISTORY — DX: Personal history of other diseases of the nervous system and sense organs: Z86.69

## 2020-05-25 HISTORY — DX: Calculus in bladder: N21.0

## 2020-05-25 HISTORY — DX: Benign prostatic hyperplasia without lower urinary tract symptoms: N40.0

## 2020-05-25 HISTORY — DX: Other seasonal allergic rhinitis: J30.2

## 2020-05-25 HISTORY — DX: Chronic gout, unspecified, without tophus (tophi): M1A.9XX0

## 2020-05-25 HISTORY — DX: Dependence on renal dialysis: N18.6

## 2020-05-25 HISTORY — DX: Chronic kidney disease, unspecified: N18.9

## 2020-05-25 LAB — BASIC METABOLIC PANEL
Anion gap: 11 (ref 5–15)
BUN: 35 mg/dL — ABNORMAL HIGH (ref 6–20)
CO2: 34 mmol/L — ABNORMAL HIGH (ref 22–32)
Calcium: 8.9 mg/dL (ref 8.9–10.3)
Chloride: 96 mmol/L — ABNORMAL LOW (ref 98–111)
Creatinine, Ser: 9.96 mg/dL — ABNORMAL HIGH (ref 0.61–1.24)
GFR, Estimated: 6 mL/min — ABNORMAL LOW (ref >60)
Glucose, Bld: 83 mg/dL (ref 70–99)
Potassium: 3.5 mmol/L (ref 3.5–5.1)
Sodium: 141 mmol/L (ref 135–145)

## 2020-05-25 LAB — CBC
HCT: 39.4 % (ref 39.0–52.0)
Hemoglobin: 12 g/dL — ABNORMAL LOW (ref 13.0–17.0)
MCH: 29.4 pg (ref 26.0–34.0)
MCHC: 30.5 g/dL (ref 30.0–36.0)
MCV: 96.6 fL (ref 80.0–100.0)
Platelets: 201 10*3/uL (ref 150–400)
RBC: 4.08 MIL/uL — ABNORMAL LOW (ref 4.22–5.81)
RDW: 15.5 % (ref 11.5–15.5)
WBC: 4.5 10*3/uL (ref 4.0–10.5)
nRBC: 0 % (ref 0.0–0.2)

## 2020-05-25 LAB — PROTIME-INR
INR: 1 (ref 0.8–1.2)
Prothrombin Time: 13.6 seconds (ref 11.4–15.2)

## 2020-05-25 SURGERY — TURP (TRANSURETHRAL RESECTION OF PROSTATE)
Anesthesia: Monitor Anesthesia Care

## 2020-05-25 MED ORDER — CALCITRIOL 0.5 MCG PO CAPS
0.5000 ug | ORAL_CAPSULE | ORAL | Status: DC
  Administered 2020-05-26: 0.5 ug via ORAL
  Filled 2020-05-25: qty 1

## 2020-05-25 MED ORDER — FUROSEMIDE 40 MG PO TABS
80.0000 mg | ORAL_TABLET | Freq: Two times a day (BID) | ORAL | Status: DC
  Administered 2020-05-25 – 2020-05-26 (×2): 80 mg via ORAL
  Filled 2020-05-25 (×2): qty 2

## 2020-05-25 MED ORDER — CHLORHEXIDINE GLUCONATE 0.12 % MT SOLN
15.0000 mL | Freq: Once | OROMUCOSAL | Status: AC
  Administered 2020-05-25: 15 mL via OROMUCOSAL

## 2020-05-25 MED ORDER — ENSURE ENLIVE PO LIQD: 237.0000 mL | Freq: Two times a day (BID) | ORAL | Status: DC

## 2020-05-25 MED ORDER — CALCIUM ACETATE (PHOS BINDER) 667 MG PO CAPS
2668.0000 mg | ORAL_CAPSULE | Freq: Three times a day (TID) | ORAL | Status: DC
  Administered 2020-05-26: 2668 mg via ORAL
  Filled 2020-05-25 (×3): qty 4

## 2020-05-25 MED ORDER — HYDROMORPHONE HCL 1 MG/ML IJ SOLN: 0.5000 mg | INTRAMUSCULAR | Status: DC | PRN

## 2020-05-25 MED ORDER — PROPOFOL 10 MG/ML IV BOLUS
INTRAVENOUS | Status: DC | PRN
  Administered 2020-05-25: 150 mg via INTRAVENOUS

## 2020-05-25 MED ORDER — OXYBUTYNIN CHLORIDE 5 MG PO TABS
5.0000 mg | ORAL_TABLET | Freq: Three times a day (TID) | ORAL | Status: DC | PRN
  Administered 2020-05-26: 5 mg via ORAL
  Filled 2020-05-25: qty 1

## 2020-05-25 MED ORDER — OXYCODONE HCL 5 MG PO TABS: 5.0000 mg | ORAL_TABLET | Freq: Once | ORAL | Status: DC | PRN

## 2020-05-25 MED ORDER — HYDROCODONE-ACETAMINOPHEN 5-325 MG PO TABS: 1.0000 | ORAL_TABLET | ORAL | Status: DC | PRN

## 2020-05-25 MED ORDER — PROPOFOL 10 MG/ML IV BOLUS
INTRAVENOUS | Status: AC
  Filled 2020-05-25: qty 20

## 2020-05-25 MED ORDER — FENTANYL CITRATE (PF) 100 MCG/2ML IJ SOLN
INTRAMUSCULAR | Status: AC
  Filled 2020-05-25: qty 2

## 2020-05-25 MED ORDER — MIDAZOLAM HCL 5 MG/5ML IJ SOLN
INTRAMUSCULAR | Status: DC | PRN
  Administered 2020-05-25: 1 mg via INTRAVENOUS

## 2020-05-25 MED ORDER — BUDESON-GLYCOPYRROL-FORMOTEROL 160-9-4.8 MCG/ACT IN AERO: 2.0000 | INHALATION_SPRAY | Freq: Two times a day (BID) | RESPIRATORY_TRACT | Status: DC | PRN

## 2020-05-25 MED ORDER — ONDANSETRON HCL 4 MG/2ML IJ SOLN
INTRAMUSCULAR | Status: DC | PRN
  Administered 2020-05-25: 4 mg via INTRAVENOUS

## 2020-05-25 MED ORDER — ACETAMINOPHEN 10 MG/ML IV SOLN: 1000.0000 mg | Freq: Once | INTRAVENOUS | Status: DC | PRN

## 2020-05-25 MED ORDER — SODIUM CHLORIDE 0.9 % IV SOLN: INTRAVENOUS | Status: DC

## 2020-05-25 MED ORDER — SODIUM CHLORIDE 0.9 % IR SOLN
3000.0000 mL | Status: DC
  Administered 2020-05-25 – 2020-05-26 (×4): 3000 mL

## 2020-05-25 MED ORDER — LIDOCAINE HCL (CARDIAC) PF 50 MG/5ML IV SOSY
PREFILLED_SYRINGE | INTRAVENOUS | Status: DC | PRN
  Administered 2020-05-25: 75 mg via INTRAVENOUS

## 2020-05-25 MED ORDER — BELLADONNA ALKALOIDS-OPIUM 16.2-30 MG RE SUPP
RECTAL | Status: AC
  Filled 2020-05-25: qty 1

## 2020-05-25 MED ORDER — HYDROMORPHONE HCL 1 MG/ML IJ SOLN
0.2500 mg | INTRAMUSCULAR | Status: DC | PRN
  Administered 2020-05-25 (×2): 0.5 mg via INTRAVENOUS

## 2020-05-25 MED ORDER — DIPHENHYDRAMINE HCL 50 MG/ML IJ SOLN
12.5000 mg | Freq: Four times a day (QID) | INTRAMUSCULAR | Status: DC | PRN
Start: 2020-05-25 — End: 2020-05-26

## 2020-05-25 MED ORDER — ONDANSETRON HCL 4 MG/2ML IJ SOLN: 4.0000 mg | Freq: Once | INTRAMUSCULAR | Status: DC | PRN

## 2020-05-25 MED ORDER — 0.9 % SODIUM CHLORIDE (POUR BTL) OPTIME
TOPICAL | Status: DC | PRN
  Administered 2020-05-25: 1000 mL

## 2020-05-25 MED ORDER — LISDEXAMFETAMINE DIMESYLATE 30 MG PO CAPS
30.0000 mg | ORAL_CAPSULE | Freq: Every morning | ORAL | Status: DC
  Administered 2020-05-26: 30 mg via ORAL
  Filled 2020-05-25: qty 1

## 2020-05-25 MED ORDER — CEFAZOLIN SODIUM-DEXTROSE 1-4 GM/50ML-% IV SOLN
1.0000 g | Freq: Two times a day (BID) | INTRAVENOUS | Status: AC
  Administered 2020-05-25: 1 g via INTRAVENOUS
  Filled 2020-05-25: qty 50

## 2020-05-25 MED ORDER — PHENYLEPHRINE HCL-NACL 10-0.9 MG/250ML-% IV SOLN
INTRAVENOUS | Status: DC | PRN
  Administered 2020-05-25: 25 ug/min via INTRAVENOUS

## 2020-05-25 MED ORDER — MIDAZOLAM HCL 2 MG/2ML IJ SOLN
INTRAMUSCULAR | Status: AC
  Filled 2020-05-25: qty 2

## 2020-05-25 MED ORDER — ONDANSETRON HCL 4 MG/2ML IJ SOLN: 4.0000 mg | INTRAMUSCULAR | Status: DC | PRN

## 2020-05-25 MED ORDER — FENTANYL CITRATE (PF) 100 MCG/2ML IJ SOLN
INTRAMUSCULAR | Status: DC | PRN
  Administered 2020-05-25: 25 ug via INTRAVENOUS
  Administered 2020-05-25: 50 ug via INTRAVENOUS
  Administered 2020-05-25: 25 ug via INTRAVENOUS
  Administered 2020-05-25: 50 ug via INTRAVENOUS

## 2020-05-25 MED ORDER — CARVEDILOL 12.5 MG PO TABS
12.5000 mg | ORAL_TABLET | Freq: Two times a day (BID) | ORAL | Status: DC
  Administered 2020-05-25: 12.5 mg via ORAL
  Filled 2020-05-25: qty 1

## 2020-05-25 MED ORDER — UMECLIDINIUM BROMIDE 62.5 MCG/INH IN AEPB
1.0000 | INHALATION_SPRAY | Freq: Every day | RESPIRATORY_TRACT | Status: DC
  Administered 2020-05-26: 1 via RESPIRATORY_TRACT
  Filled 2020-05-25: qty 7

## 2020-05-25 MED ORDER — BELLADONNA ALKALOIDS-OPIUM 16.2-60 MG RE SUPP
1.0000 | Freq: Four times a day (QID) | RECTAL | Status: DC | PRN
Start: 2020-05-25 — End: 2020-05-26
  Filled 2020-05-25: qty 1

## 2020-05-25 MED ORDER — OXYCODONE HCL 5 MG/5ML PO SOLN: 5.0000 mg | Freq: Once | ORAL | Status: DC | PRN

## 2020-05-25 MED ORDER — BELLADONNA ALKALOIDS-OPIUM 16.2-60 MG RE SUPP
RECTAL | Status: DC | PRN
  Administered 2020-05-25: 1 via RECTAL

## 2020-05-25 MED ORDER — HYDROMORPHONE HCL 1 MG/ML IJ SOLN
INTRAMUSCULAR | Status: AC
  Filled 2020-05-25: qty 1

## 2020-05-25 MED ORDER — CIPROFLOXACIN IN D5W 400 MG/200ML IV SOLN
400.0000 mg | Freq: Once | INTRAVENOUS | Status: AC
  Administered 2020-05-25: 400 mg via INTRAVENOUS
  Filled 2020-05-25: qty 200

## 2020-05-25 MED ORDER — SODIUM CHLORIDE 0.9 % IR SOLN
Status: DC | PRN
  Administered 2020-05-25: 6000 mL via INTRAVESICAL
  Administered 2020-05-25 (×2): 3000 mL via INTRAVESICAL

## 2020-05-25 MED ORDER — ALBUTEROL SULFATE HFA 108 (90 BASE) MCG/ACT IN AERS
2.0000 | INHALATION_SPRAY | Freq: Four times a day (QID) | RESPIRATORY_TRACT | Status: DC | PRN
  Administered 2020-05-25 – 2020-05-26 (×2): 2 via RESPIRATORY_TRACT
  Filled 2020-05-25: qty 6.7

## 2020-05-25 MED ORDER — ATORVASTATIN CALCIUM 20 MG PO TABS
20.0000 mg | ORAL_TABLET | Freq: Every day | ORAL | Status: DC
  Administered 2020-05-25: 20 mg via ORAL
  Filled 2020-05-25: qty 1

## 2020-05-25 MED ORDER — DIPHENHYDRAMINE HCL 12.5 MG/5ML PO ELIX: 12.5000 mg | ORAL_SOLUTION | Freq: Four times a day (QID) | ORAL | Status: DC | PRN

## 2020-05-25 MED ORDER — ACETAMINOPHEN 325 MG PO TABS: 650.0000 mg | ORAL_TABLET | ORAL | Status: DC | PRN

## 2020-05-25 MED ORDER — FLUTICASONE FUROATE-VILANTEROL 100-25 MCG/INH IN AEPB
1.0000 | INHALATION_SPRAY | Freq: Every day | RESPIRATORY_TRACT | Status: DC
  Administered 2020-05-26: 1 via RESPIRATORY_TRACT
  Filled 2020-05-25: qty 28

## 2020-05-25 SURGICAL SUPPLY — 18 items
BAG DRN RND TRDRP ANRFLXCHMBR (UROLOGICAL SUPPLIES) ×1
BAG URINE DRAIN 2000ML AR STRL (UROLOGICAL SUPPLIES) ×2 IMPLANT
BAG URO CATCHER STRL LF (MISCELLANEOUS) ×2 IMPLANT
CATH FOLEY 3WAY 30CC 22FR (CATHETERS) ×2 IMPLANT
DRAPE FOOT SWITCH (DRAPES) ×2 IMPLANT
GLOVE SURG ENC TEXT LTX SZ7.5 (GLOVE) ×2 IMPLANT
GOWN STRL REUS W/TWL XL LVL3 (GOWN DISPOSABLE) ×2 IMPLANT
HOLDER FOLEY CATH W/STRAP (MISCELLANEOUS) ×2 IMPLANT
KIT TURNOVER KIT A (KITS) ×2 IMPLANT
LOOP CUT BIPOLAR 24F LRG (ELECTROSURGICAL) ×2 IMPLANT
MANIFOLD NEPTUNE II (INSTRUMENTS) ×2 IMPLANT
PACK CYSTO (CUSTOM PROCEDURE TRAY) ×2 IMPLANT
PENCIL SMOKE EVACUATOR (MISCELLANEOUS) IMPLANT
SYR 30ML LL (SYRINGE) ×2 IMPLANT
SYR TOOMEY IRRIG 70ML (MISCELLANEOUS) ×2
SYRINGE TOOMEY IRRIG 70ML (MISCELLANEOUS) ×1 IMPLANT
TUBING CONNECTING 10 (TUBING) ×2 IMPLANT
TUBING UROLOGY SET (TUBING) ×2 IMPLANT

## 2020-05-25 NOTE — H&P (Signed)
Urology Preoperative H&P   Chief Complaint: Incomplete bladder emptying  History of Present Illness: Vernon Blackburn is a 55 y.o. male with a history of BPH, kidney stones (s/p URS in 07/2018), ED along with an extensive cardiac history (he had a Mitral valve replacement in June 2018) and stage ESRD--currently on HD MWF.   The patient recently was transitioned from peritoneal dialysis to hemodialysis every MWF. Over the past 2 to 3 months, the patient reports worsening suprapubic pressure and needing to Crede/strain in order to initiate his stream. He also reports worsening urinary urgency and a weak force of stream. He does continue to void approximately 4-5 times per day, despite being on hemodialysis. He denies interval UTIs, dysuria or hematuria. Urine culture from January shows no growth. CT from January revealed prostatomegaly as well as multiple bladder stones and nonobstructing renal stones.   Flexible cystoscopy in the office revealed bilobar prostatic urethral obstruction as well as several small 1 cm bladder stones.  Past Medical History:  Diagnosis Date  . ADHD   . Anemia, chronic renal failure   . Asthma    followed by pcp  . Benign localized prostatic hyperplasia with lower urinary tract symptoms (LUTS)   . Bladder stones   . BPH (benign prostatic hyperplasia)   . Chronic gout    05-06-2020  per pt last episode 6 months ago,  (involves right knee and right great toe)  . Chronic systolic HF (heart failure) ([place])    followed by cardiology, dr Aundra Dubin @ [facility]F clinic,   last TEE 03-06-2019 in epic ef 45%  . ESRD on hemodialysis [facility])    primary nephrologist--- dr Moshe Cipro--- HD on M/W/ F  at [place] at [facility] in [place]  . Heart murmur   . History of bladder stone   . History of cellulitis 01/27/2020   ED visit ,  abdominal wall  . History of CVA (cerebrovascular accident) without residual deficits    remote infarct per imaging MRI brain in care every where  02-07-2019  . History of kidney stones   . History of obstructive sleep apnea    05-06-2020  per pt test positive for osa prior to gastric bypass in 2014 used cpap,  pt stated was retested after alot of weight loss , test was negative   . Hypertension    followed by cardiology  . NICM (nonischemic cardiomyopathy) ([place])    followed by cardiology---- 01/ 2018  ef 20-35%, severe MV stenosis;  last TEE echo in epic 03-06-2019  ef 45%  . Peripheral edema    Bilateral lower extremity   . S/P minimally invasive mitral valve repair followed by cardiology   06-29-2016  for severe stenosis ;  30 mm [name] via right mini thoracotomy approach;  last TEE echo in epic 03-06-2019 ef 45%, mild LVH, post op mild MR with mean gradient 40mmHg and no significant stenosis  . Seasonal and perennial allergic rhinitis   . Sleep apnea     Past Surgical History:  Procedure Laterality Date  . AV FISTULA PLACEMENT Left 03/10/2019   Procedure: LEFT ARM BASILIC ARTERIOVENOUS (AV) FISTULA CREATION FIRST STAGE;  Surgeon: Marty Heck, MD;  Location: [place];  Service: Vascular;  Laterality: Left;  . BASCILIC VEIN TRANSPOSITION Left 05/01/2019   Procedure: LEFT SECOND STAGE BASCILIC VEIN TRANSPOSITION.;  Surgeon: Marty Heck, MD;  Location: [place];  Service: Vascular;  Laterality: Left;  . CYSTOSCOPY/URETEROSCOPY/HOLMIUM LASER/STENT PLACEMENT Right  07/17/2018   Procedure: CYSTOSCOPY/RETROGRADE/URETEROSCOPY/HOLMIUM LASER/STENT PLACEMENT/ CYSTOLITHALOPAXY;  Surgeon: Ceasar Mons, MD;  Location: [facility];  Service: Urology;  Laterality: Right;  . GASTRIC BYPASS  01/2012  . KNEE ARTHROSCOPY W/ MENISCAL REPAIR Right 05/2008  . LAPAROSCOPIC INGUINAL HERNIA REPAIR Left 03-24-2015  @ [facility]  . MITRAL VALVE REPAIR Right 06/29/2016   Procedure: MINIMALLY INVASIVE MITRAL VALVE REPAIR  (MVR);  Surgeon: [name], MD;  Location: [place];  Service: Open Heart Surgery;  Laterality: Right;   . PERITONEAL CATHETER INSERTION  08-18-2019  @[facility]    AND OPEN RIGHT INGUINAL HERNIA REPAIR  . PERITONEAL CATHETER REMOVAL  03-31-2020  @[facility]   . RIGHT/LEFT HEART CATH AND CORONARY ANGIOGRAPHY N/A 06/07/2016   Procedure: Right/Left Heart Cath and Coronary Angiography;  Surgeon: Larey Dresser, MD;  Location: [place] [facility];  Service: Cardiovascular;  Laterality: N/A;  . TEE WITHOUT CARDIOVERSION N/A 05/04/2016   Procedure: TRANSESOPHAGEAL ECHOCARDIOGRAM (TEE);  Surgeon: Larey Dresser, MD;  Location: [facility];  Service: Cardiovascular;  Laterality: N/A;  . TEE WITHOUT CARDIOVERSION N/A 06/29/2016   Procedure: TRANSESOPHAGEAL ECHOCARDIOGRAM (TEE);  Surgeon: [name], MD;  Location: [place];  Service: Open Heart Surgery;  Laterality: N/A;  . TEE WITHOUT CARDIOVERSION N/A 03/06/2019   Procedure: TRANSESOPHAGEAL ECHOCARDIOGRAM (TEE);  Surgeon: Larey Dresser, MD;  Location: [place];  Service: Cardiovascular;  Laterality: N/A;  . UMBILICAL HERNIA REPAIR  09-05-2012  @[facility]     Allergies:  Allergies  Allergen Reactions  . Isosorbide     Hypotension     Family History  Problem Relation Age of Onset  . Diabetes Mother   . Hypertension Mother   . Heart failure Mother   . Colon cancer Neg Hx   . Esophageal cancer Neg Hx   . Rectal cancer Neg Hx   . Stomach cancer Neg Hx     Social History:  reports that he has never smoked. He has never used smokeless tobacco. He reports previous alcohol use. He reports that he does not use drugs.  ROS: A complete review of systems was performed.  All systems are negative except for pertinent findings as noted.  Physical Exam:  Vital signs in last 24 hours: Temp:  [97.8 F (36.6 C)] 97.8 F (36.6 C) (05/24 1250) Pulse Rate:  [67] 67 (05/24 1250) Resp:  [16] 16 (05/24 1250) BP: (139)/(98) 139/98 (05/24 1250) SpO2:  [97 %] 97 % (05/24 1250) Weight:  [80.1 kg] 80.1 kg (05/24 1250) Constitutional:  Alert and oriented, No acute  distress Cardiovascular: Regular rate and rhythm, No JVD Respiratory: Normal respiratory effort, Lungs clear bilaterally GI: Abdomen is soft, nontender, nondistended, no abdominal masses GU: No CVA tenderness Lymphatic: No lymphadenopathy Neurologic: Grossly intact, no focal deficits Psychiatric: Normal mood and affect  Laboratory Data:  Recent Labs    05/25/20 1320  WBC 4.5  HGB 12.0*  HCT 39.4  PLT 201    Recent Labs    05/25/20 1320  NA 141  K 3.5  CL 96*  GLUCOSE 83  BUN 35*  CALCIUM 8.9  CREATININE 9.96*     Results for orders placed or performed during the hospital encounter of 05/25/20 (from the past 24 hour(s))  CBC     Status: Abnormal   Collection Time: 05/25/20  1:20 PM  Result Value Ref Range   WBC 4.5 4.0 - 10.5 K/uL   RBC 4.08 (L) 4.22 - 5.81 MIL/uL   Hemoglobin 12.0 (L) 13.0 - 17.0 g/dL  HCT 39.4 39.0 - 52.0 %   MCV 96.6 80.0 - 100.0 fL   MCH 29.4 26.0 - 34.0 pg   MCHC 30.5 30.0 - 36.0 g/dL   RDW 15.5 11.5 - 15.5 %   Platelets 201 150 - 400 K/uL   nRBC 0.0 0.0 - 0.2 %  Basic metabolic panel     Status: Abnormal   Collection Time: 05/25/20  1:20 PM  Result Value Ref Range   Sodium 141 135 - 145 mmol/L   Potassium 3.5 3.5 - 5.1 mmol/L   Chloride 96 (L) 98 - 111 mmol/L   CO2 34 (H) 22 - 32 mmol/L   Glucose, Bld 83 70 - 99 mg/dL   BUN 35 (H) 6 - 20 mg/dL   Creatinine, Ser 9.96 (H) 0.61 - 1.24 mg/dL   Calcium 8.9 8.9 - 10.3 mg/dL   GFR, Estimated 6 (L) >60 mL/min   Anion gap 11 5 - 15   Recent Results (from the past 240 hour(s))  SARS CORONAVIRUS 2 (TAT 6-24 HRS) Nasopharyngeal Nasopharyngeal Swab     Status: None   Collection Time: 05/21/20 11:00 AM   Specimen: Nasopharyngeal Swab  Result Value Ref Range Status   SARS Coronavirus 2 NEGATIVE NEGATIVE Final    Comment: (NOTE) SARS-CoV-2 target nucleic acids are NOT DETECTED.  The SARS-CoV-2 RNA is generally detectable in upper and lower respiratory specimens during the acute phase of  infection. Negative results do not preclude SARS-CoV-2 infection, do not rule out co-infections with other pathogens, and should not be used as the sole basis for treatment or other patient management decisions. Negative results must be combined with clinical observations, patient history, and epidemiological information. The expected result is Negative.  Fact Sheet for Patients: SugarRoll.be  Fact Sheet for Healthcare Providers: https://www.woods-mathews.com/  This test is not yet approved or cleared by the [place] FDA and  has been authorized for detection and/or diagnosis of SARS-CoV-2 by FDA under an Emergency Use Authorization (EUA). This EUA will remain  in effect (meaning this test can be used) for the duration of the COVID-19 declaration under Se ction 564(b)(1) of the Act, 21 U.S.C. section 360bbb-3(b)(1), unless the authorization is terminated or revoked sooner.  Performed at [facility], [place] [address], [place], [place] [address]     Renal Function: Recent Labs    05/25/20 1320  CREATININE 9.96*   Estimated Creatinine Clearance: 8.9 mL/min (A) (by C-G formula based on SCr of 9.96 mg/dL (H)).  Radiologic Imaging: No results found.  I independently reviewed the above imaging studies.  Assessment and Plan JOENATHAN SAKUMA is a 55 y.o. male with a history of BPH with LUTS along with end-stage renal disease requiring hemodialysis on M/W/F  The risks, benefits and alternatives of cystoscopy with TURP and cystolithalopaxy was discussed with the patient. The risks included, but are not limited to, bleeding, urinary tract infection, bladder perforation requiring prolonged catheterization and/or open bladder repair, ureteral injury, ureteral obstruction, urethral stricture disease, new or worsening voiding dysfunction, retrograde ejaculation, MI, CVA, PE, DVT and the inherent risks of general anesthesia. We also discussed  the need for Foley catheterization for at least 3 days post-op and the likely need for post-op observation in the hospital following the procedure. The patient voices understanding and wishes to proceed.    Ellison Hughs, MD 05/25/2020, 1:57 PM  [facility] Pager: [phone]

## 2020-05-25 NOTE — Anesthesia Postprocedure Evaluation (Signed)
Anesthesia Post Note  Patient: CONCEPCION KIRKPATRICK  Procedure(s) Performed: TRANSURETHRAL RESECTION OF THE PROSTATE (TURP) WITH CYSTOLITHOLAPAXY (N/A )     Patient location during evaluation: PACU Anesthesia Type: General Level of consciousness: awake and alert Pain management: pain level controlled Vital Signs Assessment: post-procedure vital signs reviewed and stable Respiratory status: spontaneous breathing, nonlabored ventilation, respiratory function stable and patient connected to nasal cannula oxygen Cardiovascular status: blood pressure returned to baseline and stable Postop Assessment: no apparent nausea or vomiting Anesthetic complications: no   No complications documented.  Last Vitals:  Vitals:   05/25/20 1545 05/25/20 1600  BP: (!) 131/104 (!) 141/108  Pulse: 70 71  Resp: 14 10  Temp:    SpO2: 100% 100%    Last Pain:  Vitals:   05/25/20 1536  TempSrc:   PainSc: 0-No pain                 Khamani Daniely S

## 2020-05-25 NOTE — Op Note (Signed)
Operative Note  Preoperative diagnosis:  1.  BPH with bladder outlet obstruction 2.  Bladder stone  Postoperative diagnosis: 1.  BPH with bladder outlet obstruction 2.  Bladder stone  Procedure(s): 1.  Bipolar TURP  Surgeon: Ellison Hughs, MD  Assistants:  None  Anesthesia:  General  Complications:  None  EBL: 100 mL  Specimens: 1. Prostate chips  Drains/Catheters: 1.  22 French three-way Foley catheter with 30 mL in the balloon  Intraoperative findings:   1. Trilobar prostatic urethral obstruction with prominent median lobe 2. 1 small bladder stone string approximately 1 cm was siphoned through the sheath of the resectoscope  Indication:  Vernon Blackburn is a 55 y.o. male with a history of ESRD (currently on hemodialysis on M/W/F) who is hoping to get on the renal transplant list.  He has a significant urologic history of BPH with progressively worsening lower urinary tract symptoms, despite tamsulosin twice daily.  He continues to make urine from his native kidneys and states that he typically voids 4-5 times per day.  In order to initiate his void, the patient has to strain and Crede.  Cystoscopy in the office revealed trilobar prostatic urethral obstruction with multiple small bladder stones.  He has been consented for the above procedures, voices understanding and wishes to proceed.  Description of procedure:  After informed consent was obtained, the patient was brought to the operating room and general anesthesia was administered. The patient was then placed in the dorsolithotomy position and prepped and draped in usual sterile fashion. A timeout was performed. A 23 French rigid cystoscope was then inserted into the urethral meatus and advanced into the bladder under direct vision. A complete bladder survey revealed no intravesical pathology.  A 1 cm bladder stone was siphoned through the sheath of the cystoscope.  No other bladder stones were identified.  Both  ureteral orifices were identified and well away from the bladder neck.  The rigid cystoscope was then exchanged for a 26 French resectoscope with a bipolar loop working element.  Starting at the bladder neck and progressing distally to the verumontanum, the prostatic adenoma was systematically resected until a widely patent prostatic urethral channel was created.  All prostate chips were then hand irrigated out of the bladder and sent to pathology for permanent section.  The resectoscope was then removed and exchanged for a 22 French three-way Foley catheter.  The three-way Foley catheter was then extensively hand irrigated until the irrigant returned clear to light pink.  The catheter was then placed to continuous bladder irrigation and placed on rubber band traction.  He tolerated the procedure well and was transferred to the postanesthesia unit in stable condition.  Plan:  CBI overnight

## 2020-05-25 NOTE — Anesthesia Procedure Notes (Signed)
Procedure Name: LMA Insertion Date/Time: 05/25/2020 2:33 PM Performed by: Lissa Morales, CRNA Pre-anesthesia Checklist: Patient identified, Emergency Drugs available, Suction available and Patient being monitored Patient Re-evaluated:Patient Re-evaluated prior to induction Oxygen Delivery Method: Circle system utilized Preoxygenation: Pre-oxygenation with 100% oxygen Induction Type: IV induction Ventilation: Mask ventilation without difficulty LMA: LMA with gastric port inserted LMA Size: 5.0 Tube type: Oral Number of attempts: 1 Airway Equipment and Method: Oral airway Placement Confirmation: positive ETCO2 Tube secured with: Tape Dental Injury: Teeth and Oropharynx as per pre-operative assessment

## 2020-05-25 NOTE — Transfer of Care (Signed)
Immediate Anesthesia Transfer of Care Note  Patient: Vernon Blackburn  Procedure(s) Performed: TRANSURETHRAL RESECTION OF THE PROSTATE (TURP) WITH CYSTOLITHOLAPAXY (N/A )  Patient Location: PACU  Anesthesia Type:General  Level of Consciousness: awake and patient cooperative  Airway & Oxygen Therapy: Patient Spontanous Breathing and Patient connected to face mask oxygen  Post-op Assessment: Report given to RN, Post -op Vital signs reviewed and stable and Patient moving all extremities X 4  Post vital signs: stable  Last Vitals:  Vitals Value Taken Time  BP 134/95 05/25/20 1535  Temp    Pulse    Resp 11 05/25/20 1541  SpO2    Vitals shown include unvalidated device data.  Last Pain:  Vitals:   05/25/20 1302  TempSrc:   PainSc: 0-No pain         Complications: No complications documented.

## 2020-05-25 NOTE — Anesthesia Preprocedure Evaluation (Addendum)
Anesthesia Evaluation  Patient identified by MRN, date of birth, ID band Patient awake    Reviewed: Allergy & Precautions, NPO status , Patient's Chart, lab work & pertinent test results  Airway Mallampati: I  TM Distance: >3 FB Neck ROM: Full    Dental no notable dental hx.    Pulmonary neg pulmonary ROS,    Pulmonary exam normal breath sounds clear to auscultation       Cardiovascular hypertension, +CHF  Normal cardiovascular exam+ Valvular Problems/Murmurs  Rhythm:Regular Rate:Normal + Systolic murmurs S/P MVR   Neuro/Psych negative neurological ROS  negative psych ROS   GI/Hepatic negative GI ROS, Neg liver ROS,   Endo/Other  negative endocrine ROS  Renal/GU DialysisRenal disease  negative genitourinary   Musculoskeletal negative musculoskeletal ROS (+)   Abdominal   Peds negative pediatric ROS (+)  Hematology negative hematology ROS (+)   Anesthesia Other Findings   Reproductive/Obstetrics negative OB ROS                            Anesthesia Physical Anesthesia Plan  ASA: III  Anesthesia Plan: General   Post-op Pain Management:    Induction: Intravenous  PONV Risk Score and Plan: 1 and Propofol infusion  Airway Management Planned: Simple Face Mask and LMA  Additional Equipment:   Intra-op Plan:   Post-operative Plan: Extubation in OR  Informed Consent: I have reviewed the patients History and Physical, chart, labs and discussed the procedure including the risks, benefits and alternatives for the proposed anesthesia with the patient or authorized representative who has indicated his/her understanding and acceptance.     Dental advisory given  Plan Discussed with: CRNA and Surgeon  Anesthesia Plan Comments:        Anesthesia Quick Evaluation

## 2020-05-26 ENCOUNTER — Encounter (HOSPITAL_COMMUNITY): Payer: Self-pay | Admitting: Urology

## 2020-05-26 ENCOUNTER — Emergency Department (HOSPITAL_COMMUNITY)
Admission: EM | Admit: 2020-05-26 | Discharge: 2020-05-26 | Disposition: A | Discharge status: Home / Self Care | Payer: Medicare Other | Source: Home / Self Care | Attending: Emergency Medicine | Admitting: Emergency Medicine

## 2020-05-26 ENCOUNTER — Other Ambulatory Visit: Payer: Self-pay

## 2020-05-26 DIAGNOSIS — Z992 Dependence on renal dialysis: Secondary | ICD-10-CM | POA: Diagnosis not present

## 2020-05-26 DIAGNOSIS — Y846 Urinary catheterization as the cause of abnormal reaction of the patient, or of later complication, without mention of misadventure at the time of the procedure: Secondary | ICD-10-CM | POA: Insufficient documentation

## 2020-05-26 DIAGNOSIS — N186 End stage renal disease: Secondary | ICD-10-CM | POA: Diagnosis not present

## 2020-05-26 DIAGNOSIS — N2581 Secondary hyperparathyroidism of renal origin: Secondary | ICD-10-CM | POA: Diagnosis not present

## 2020-05-26 DIAGNOSIS — I5022 Chronic systolic (congestive) heart failure: Secondary | ICD-10-CM | POA: Insufficient documentation

## 2020-05-26 DIAGNOSIS — R319 Hematuria, unspecified: Secondary | ICD-10-CM | POA: Insufficient documentation

## 2020-05-26 DIAGNOSIS — Z79899 Other long term (current) drug therapy: Secondary | ICD-10-CM | POA: Insufficient documentation

## 2020-05-26 DIAGNOSIS — J45909 Unspecified asthma, uncomplicated: Secondary | ICD-10-CM | POA: Insufficient documentation

## 2020-05-26 DIAGNOSIS — N401 Enlarged prostate with lower urinary tract symptoms: Secondary | ICD-10-CM | POA: Diagnosis not present

## 2020-05-26 DIAGNOSIS — D509 Iron deficiency anemia, unspecified: Secondary | ICD-10-CM | POA: Diagnosis not present

## 2020-05-26 DIAGNOSIS — T83098A Other mechanical complication of other indwelling urethral catheter, initial encounter: Secondary | ICD-10-CM | POA: Insufficient documentation

## 2020-05-26 DIAGNOSIS — I132 Hypertensive heart and chronic kidney disease with heart failure and with stage 5 chronic kidney disease, or end stage renal disease: Secondary | ICD-10-CM | POA: Insufficient documentation

## 2020-05-26 DIAGNOSIS — D689 Coagulation defect, unspecified: Secondary | ICD-10-CM | POA: Diagnosis not present

## 2020-05-26 DIAGNOSIS — T85698A Other mechanical complication of other specified internal prosthetic devices, implants and grafts, initial encounter: Secondary | ICD-10-CM

## 2020-05-26 LAB — BASIC METABOLIC PANEL
Anion gap: 10 (ref 5–15)
BUN: 43 mg/dL — ABNORMAL HIGH (ref 6–20)
CO2: 31 mmol/L (ref 22–32)
Calcium: 8.1 mg/dL — ABNORMAL LOW (ref 8.9–10.3)
Chloride: 99 mmol/L (ref 98–111)
Creatinine, Ser: 11 mg/dL — ABNORMAL HIGH (ref 0.61–1.24)
GFR, Estimated: 5 mL/min — ABNORMAL LOW (ref >60)
Glucose, Bld: 123 mg/dL — ABNORMAL HIGH (ref 70–99)
Potassium: 4.2 mmol/L (ref 3.5–5.1)
Sodium: 140 mmol/L (ref 135–145)

## 2020-05-26 LAB — HEMOGLOBIN AND HEMATOCRIT, BLOOD
HCT: 28.6 % — ABNORMAL LOW (ref 39.0–52.0)
Hemoglobin: 8.7 g/dL — ABNORMAL LOW (ref 13.0–17.0)

## 2020-05-26 LAB — SURGICAL PATHOLOGY

## 2020-05-26 LAB — HIV ANTIBODY (ROUTINE TESTING W REFLEX): HIV Screen 4th Generation wRfx: NONREACTIVE

## 2020-05-26 MED ORDER — TRAMADOL HCL 50 MG PO TABS: 50.0000 mg | ORAL_TABLET | Freq: Four times a day (QID) | ORAL | 0 refills | Status: AC | PRN

## 2020-05-26 MED ORDER — CHLORHEXIDINE GLUCONATE CLOTH 2 % EX PADS
6.0000 | MEDICATED_PAD | Freq: Every day | CUTANEOUS | Status: DC
  Administered 2020-05-26: 6 via TOPICAL

## 2020-05-26 MED ORDER — OXYBUTYNIN CHLORIDE 5 MG PO TABS
5.0000 mg | ORAL_TABLET | Freq: Once | ORAL | Status: AC
  Administered 2020-05-26: 5 mg via ORAL
  Filled 2020-05-26: qty 1

## 2020-05-26 MED ORDER — OXYBUTYNIN CHLORIDE 5 MG PO TABS: 5.0000 mg | ORAL_TABLET | Freq: Three times a day (TID) | ORAL | 1 refills | Status: DC | PRN

## 2020-05-26 MED ORDER — CEPHALEXIN 500 MG PO CAPS: 500.0000 mg | ORAL_CAPSULE | Freq: Two times a day (BID) | ORAL | 0 refills | Status: AC

## 2020-05-26 MED ORDER — PHENAZOPYRIDINE HCL 200 MG PO TABS: 200.0000 mg | ORAL_TABLET | Freq: Three times a day (TID) | ORAL | 0 refills | Status: DC | PRN

## 2020-05-26 NOTE — ED Notes (Signed)
ED Provider at bedside. 

## 2020-05-26 NOTE — Plan of Care (Signed)

## 2020-05-26 NOTE — Progress Notes (Signed)
Discharge, medication, and follow up instructions, given, verbalized understanding, IV removed, personal belongings with patient, spouse to transport home

## 2020-05-26 NOTE — Progress Notes (Signed)
Patient ambulated hallway x 2 per MD request prior to DC home, urine remains pink at this time, instructed on foley care,  verbalized understanding

## 2020-05-26 NOTE — Discharge Summary (Addendum)
Date of admission: 05/25/2020  Date of discharge: 05/26/2020  Admission diagnosis: 1.  BPH w/ LUTS 2.  ESRD  Discharge diagnosis: Same  Procedure:  Bipolar TURP  History and Physical: For full details, please see admission history and physical. Briefly, Vernon Blackburn is a 55 y.o. year old patient with BPH with refractory LUTS as well as ESRD (currently trying to get on the renal transplant list).  He underwent a  TURP on 05/25/20.   Hospital Course: The patient was monitored on the floor post-op.  His developed gross hematuria overnight that eventually resolved by the morning of POD1.  He had a drop in his H/H, but remained hemodynamically stable at discharge.  He is scheduled for hemodialysis at 11:15 this morning at which time he will receive a blood transfusion.    Physical Exam:  General: Alert and oriented CV: RRR, palpable distal pulses Lungs: CTAB, equal chest rise Abdomen: Soft, NTND, no rebound or guarding GU:  22 F 3-way Foley in place and draining light pink urine w/o CBI Ext: NT, No erythema  Laboratory values: Recent Labs    05/25/20 1320 05/26/20 0451  HGB 12.0* 8.7*  HCT 39.4 28.6*   Recent Labs    05/25/20 1320 05/26/20 0451  CREATININE 9.96* 11.00*    Disposition: Home  Discharge instruction: The patient was instructed to be ambulatory but told to refrain from heavy lifting, strenuous activity, or driving.  Discharge medications:  Allergies as of 05/26/2020      Reactions   Isosorbide    Hypotension       Medication List    TAKE these medications   atorvastatin 20 MG tablet Commonly known as: LIPITOR Take 20 mg by mouth at bedtime. Notes to patient: 05/26/20   [facility] Aerosphere 160-9-4.8 MCG/ACT Aero Generic drug: Budeson-Glycopyrrol-Formoterol Inhale 2 puffs into the lungs 2 (two) times daily as needed (congestion/respiratory issues.).   calcitRIOL 0.5 MCG capsule Commonly known as: ROCALTROL Take 0.5 mcg by mouth every Monday, Wednesday,  and Friday. Notes to patient: 05/28/20   calcium acetate 667 MG capsule Commonly known as: PHOSLO Take 2,668 mg by mouth 3 (three) times daily with meals. Notes to patient: 05/26/20   carvedilol 12.5 MG tablet Commonly known as: COREG Take 12.5 mg by mouth in the morning and at bedtime. Notes to patient: 05/26/20   cephALEXin 500 MG capsule Commonly known as: KEFLEX Take 1 capsule (500 mg total) by mouth 2 (two) times daily for 3 days.   DIALYVITE 800 WITH ZINC 0.8 MG Tabs Take 1 tablet by mouth daily with supper. Notes to patient: 05/26/20   fexofenadine 180 MG tablet Commonly known as: ALLEGRA Take 180 mg by mouth in the morning. Notes to patient: 05/27/20   fluocinonide ointment 0.05 % Commonly known as: LIDEX Apply 1 application topically 2 (two) times daily as needed (eczema). (Hands)   fluticasone 50 MCG/ACT nasal spray Commonly known as: FLONASE Place 1 spray into both nostrils daily as needed for allergies or rhinitis.   furosemide 80 MG tablet Commonly known as: LASIX Take 80 mg by mouth in the morning and at bedtime. Notes to patient: 05/26/20   lidocaine-prilocaine cream Commonly known as: EMLA Apply 1 application topically See admin instructions. Apply at site before dialysis to numb area   oxybutynin 5 MG tablet Commonly known as: DITROPAN Take 1 tablet (5 mg total) by mouth every 8 (eight) hours as needed for bladder spasms.   phenazopyridine 200 MG tablet Commonly known as: Pyridium  Take 1 tablet (200 mg total) by mouth 3 (three) times daily as needed (for pain with urination).   pimecrolimus 1 % cream Commonly known as: Elidel Apply topically 2 (two) times daily. Notes to patient: 04/11/28   ProAir RespiClick 131 (90 Base) MCG/ACT Aepb Generic drug: Albuterol Sulfate Inhale 2 puffs into the lungs every 6 (six) hours as needed (wheezing/shortness of breath).   sildenafil 20 MG tablet Commonly known as: REVATIO Take 20 mg by mouth daily as needed  (erectile dysfunction).   tacrolimus 0.1 % ointment Commonly known as: PROTOPIC Apply 1 application topically 2 (two) times daily as needed (eczema). (eyelids/hands)   tamsulosin 0.4 MG Caps capsule Commonly known as: FLOMAX Take 0.4 mg by mouth in the morning. Notes to patient: 05/27/20   traMADol 50 MG tablet Commonly known as: Ultram Take 1 tablet (50 mg total) by mouth every 6 (six) hours as needed for up to 3 days.   Vyvanse 30 MG capsule Generic drug: lisdexamfetamine Take 30 mg by mouth every morning. Notes to patient: 05/27/20       Followup:   Follow-up Information    [facility] On 05/31/2020.   Contact information: [place] [phone]

## 2020-05-26 NOTE — Discharge Instructions (Addendum)
You were provided with a new catheter on today's visit.  Please continue taking your Oxybutynin in order to help with your bladder spasms.  [facility] with [facility] for further management of your urine catheter.

## 2020-05-26 NOTE — Progress Notes (Signed)
Patient requiring fast CBI .  Remains bloody with numerous clots.  Pt has required hand irrigation 4 times.  Blood pressure has continued to drop through the day/night.  Patient complained of shortness of breath and anxiety earlier in the night and was given an albuterol inhaler treatment.  Patient remains anxious.  Nursing staff provided emotional support and reassurance. BP 89/62 at 0155.  Urology notified of patient status.  No new orders.    0500: BP 88/64; CBI pink, less clots.  Will continue to monitor.

## 2020-05-26 NOTE — ED Triage Notes (Signed)
Pt reports his foley catheter is not draining properly and urine is leaking out from around it. There is blood in the catheter bag at this time. Pt just had a urinary procedure yesterday.

## 2020-05-26 NOTE — ED Provider Notes (Signed)
TURP  [place] DEPT Provider Note   CSN: [id number] Arrival date & time: 05/26/20  1926     History Chief Complaint  Patient presents with  . Catheter Issue    Vernon Blackburn is a 55 y.o. male.  55 y.o male with a PMH of ESRD, BPH, CHF presents to the ED with a chief complaint of catheter issue. Patient had a TURP procedure performed by Dr. Gilford Rile this morning, he was discharge at 9:30 in order to receive dialysis at 11.  Reports after completing his dialysis session, he noted he had not had any urinary output into his catheter.  He does report feeling some urinary urgency, frequency, feels like he has to need to void, however when he attempts to do this he feels that urine pours out from the side of the catheter.  He reports calling the on-call [facility], who recommended for him to be seen in the Emergency Department.  Patient is supposed to have catheter in place until Jun 01, 2020.  He endorses hematuria, with multiple clots noted.  No fevers, no abdominal pain or other complaints.  The history is provided by the patient and medical records.       Past Medical History:  Diagnosis Date  . ADHD   . Anemia, chronic renal failure   . Asthma    followed by pcp  . Benign localized prostatic hyperplasia with lower urinary tract symptoms (LUTS)   . Bladder stones   . BPH (benign prostatic hyperplasia)   . Chronic gout    05-06-2020  per pt last episode 6 months ago,  (involves right knee and right great toe)  . Chronic systolic HF (heart failure) ([place])    followed by cardiology, dr Aundra Dubin @ CHF clinic,   last TEE 03-06-2019 in epic ef 45%  . ESRD on hemodialysis [facility])    primary nephrologist--- dr Moshe Cipro--- HD on M/W/ F  at [facility] at [facility] in [place]  . Heart murmur   . History of bladder stone   . History of cellulitis 01/27/2020   ED visit ,  abdominal wall  . History of CVA (cerebrovascular accident) without  residual deficits    remote infarct per imaging MRI brain in care every where 02-07-2019  . History of kidney stones   . History of obstructive sleep apnea    05-06-2020  per pt test positive for osa prior to gastric bypass in 2014 used cpap,  pt stated was retested after alot of weight loss , test was negative   . Hypertension    followed by cardiology  . NICM (nonischemic cardiomyopathy) ([place])    followed by cardiology---- 01/ 2018  ef 20-35%, severe MV stenosis;  last TEE echo in epic 03-06-2019  ef 45%  . Peripheral edema    Bilateral lower extremity   . S/P minimally invasive mitral valve repair followed by cardiology   06-29-2016  for severe stenosis ;  30 mm [name] via right mini thoracotomy approach;  last TEE echo in epic 03-06-2019 ef 45%, mild LVH, post op mild MR with mean gradient 2mmHg and no significant stenosis  . Seasonal and perennial allergic rhinitis   . Sleep apnea     Patient Active Problem List   Diagnosis Date Noted  . BPH with obstruction/lower urinary tract symptoms 05/25/2020  . Abdominal wall cellulitis 01/27/2020  . ESRD on dialysis ([place]) 04/22/2019  . Long term (current) use of  anticoagulants [Z79.01] 07/17/2016  . Syncope and collapse 07/14/2016  . Chest pain 07/13/2016  . Other chest pain   . Coagulopathy ([place])   . S/P minimally invasive mitral valve repair 06/29/2016  . CHF (congestive heart failure) ([place]) 06/06/2016  . Hypertensive heart and chronic kidney disease with heart failure and stage 1 through stage 4 chronic kidney disease, or chronic kidney disease ([place]) 02/27/2016  . Chronic systolic HF (heart failure) ([place])   . Cardiomyopathy ([place])   . Pulmonary HTN ([place])   . DOE (dyspnea on exertion) 01/02/2016  . Chronic kidney disease (CKD), stage IV (severe) ([place])   . Anemia   . Edema 11/15/2011    Past Surgical History:  Procedure Laterality Date  . AV FISTULA PLACEMENT Left 03/10/2019   Procedure: LEFT ARM BASILIC  ARTERIOVENOUS (AV) FISTULA CREATION FIRST STAGE;  Surgeon: Marty Heck, MD;  Location: [facility];  Service: Vascular;  Laterality: Left;  . BASCILIC VEIN TRANSPOSITION Left 05/01/2019   Procedure: LEFT SECOND STAGE BASCILIC VEIN TRANSPOSITION.;  Surgeon: Marty Heck, MD;  Location: [place];  Service: Vascular;  Laterality: Left;  . CYSTOSCOPY/URETEROSCOPY/HOLMIUM LASER/STENT PLACEMENT Right 07/17/2018   Procedure: CYSTOSCOPY/RETROGRADE/URETEROSCOPY/HOLMIUM LASER/STENT PLACEMENT/ CYSTOLITHALOPAXY;  Surgeon: Ceasar Mons, MD;  Location[facility];  Service: Urology;  Laterality: Right;  . GASTRIC BYPASS  01/2012  . KNEE ARTHROSCOPY W/ MENISCAL REPAIR Right 05/2008  . LAPAROSCOPIC INGUINAL HERNIA REPAIR Left 03-24-2015  @ [facility]  . MITRAL VALVE REPAIR Right 06/29/2016   Procedure: MINIMALLY INVASIVE MITRAL VALVE REPAIR  (MVR);  Surgeon: [name], MD;  Location: [place];  Service: Open Heart Surgery;  Laterality: Right;  . PERITONEAL CATHETER INSERTION  08-18-2019  @[facility]    AND OPEN RIGHT INGUINAL HERNIA REPAIR  . PERITONEAL CATHETER REMOVAL  03-31-2020  @[facility]   . RIGHT/LEFT HEART CATH AND CORONARY ANGIOGRAPHY N/A 06/07/2016   Procedure: Right/Left Heart Cath and Coronary Angiography;  Surgeon: Larey Dresser, MD;  Location: [place] CV LAB;  Service: Cardiovascular;  Laterality: N/A;  . TEE WITHOUT CARDIOVERSION N/A 05/04/2016   Procedure: TRANSESOPHAGEAL ECHOCARDIOGRAM (TEE);  Surgeon: Larey Dresser, MD;  Location: [facility] ENDOSCOPY;  Service: Cardiovascular;  Laterality: N/A;  . TEE WITHOUT CARDIOVERSION N/A 06/29/2016   Procedure: TRANSESOPHAGEAL ECHOCARDIOGRAM (TEE);  Surgeon: [name], MD;  Location: [place];  Service: Open Heart Surgery;  Laterality: N/A;  . TEE WITHOUT CARDIOVERSION N/A 03/06/2019   Procedure: TRANSESOPHAGEAL ECHOCARDIOGRAM (TEE);  Surgeon: Larey Dresser, MD;  Location: [facility] ENDOSCOPY;  Service: Cardiovascular;  Laterality: N/A;  . TRANSURETHRAL  RESECTION OF PROSTATE N/A 05/25/2020   Procedure: TRANSURETHRAL RESECTION OF THE PROSTATE (TURP) WITH CYSTOLITHOLAPAXY;  Surgeon: Ceasar Mons, MD;  [facility];  Service: Urology;  Laterality: N/A;  . UMBILICAL HERNIA REPAIR  09-05-2012  @[facility]        Family History  Problem Relation Age of [place]  . Diabetes Mother   . Hypertension Mother   . Heart failure Mother   . Colon cancer Neg Hx   . Esophageal cancer Neg Hx   . Rectal cancer Neg Hx   . Stomach cancer Neg Hx     Social History   Tobacco Use  . Smoking status: Never Smoker  . Smokeless tobacco: Never Used  Vaping Use  . Vaping Use: Never used  Substance Use Topics  . Alcohol use: Not Currently  . Drug use: Never    Home Medications Prior to Admission medications   Medication Sig Start Date End Date  Taking? Authorizing Provider  Albuterol Sulfate (PROAIR RESPICLICK) 417 (90 Base) MCG/ACT AEPB Inhale 2 puffs into the lungs every 6 (six) hours as needed (wheezing/shortness of breath).    [provider]  atorvastatin (LIPITOR) 20 MG tablet Take 20 mg by mouth at bedtime. 04/09/20   [provider]  B Complex-C-Zn-Folic Acid (DIALYVITE 408 WITH ZINC) 0.8 MG TABS Take 1 tablet by mouth daily with supper. 01/07/20   [provider]  Budeson-Glycopyrrol-Formoterol (BREZTRI AEROSPHERE) 160-9-4.8 MCG/ACT AERO Inhale 2 puffs into the lungs 2 (two) times daily as needed (congestion/respiratory issues.).    [provider]  calcitRIOL (ROCALTROL) 0.5 MCG capsule Take 0.5 mcg by mouth every Monday, Wednesday, and Friday. 04/28/20   [provider]  calcium acetate (PHOSLO) 667 MG capsule Take 2,668 mg by mouth 3 (three) times daily with meals. 11/22/19   [provider]  carvedilol (COREG) 12.5 MG tablet Take 12.5 mg by mouth in the morning and at bedtime.    [provider]  cephALEXin (KEFLEX) 500 MG capsule Take 1 capsule (500 mg total) by mouth 2 (two)  times daily for 3 days. 05/26/20 05/29/20  Ceasar Mons, MD  fexofenadine (ALLEGRA) 180 MG tablet Take 180 mg by mouth in the morning.    [provider]  fluocinonide ointment (LIDEX) 1.44 % Apply 1 application topically 2 (two) times daily as needed (eczema). (Hands) 04/03/20   [provider]  fluticasone (FLONASE) 50 MCG/ACT nasal spray Place 1 spray into both nostrils daily as needed for allergies or rhinitis.     [provider]  furosemide (LASIX) 80 MG tablet Take 80 mg by mouth in the morning and at bedtime. 02/14/19   [provider]  lidocaine-prilocaine (EMLA) cream Apply 1 application topically See admin instructions. Apply at site before dialysis to numb area    [provider]  oxybutynin (DITROPAN) 5 MG tablet Take 1 tablet (5 mg total) by mouth every 8 (eight) hours as needed for bladder spasms. 05/26/20   Ceasar Mons, MD  phenazopyridine (PYRIDIUM) 200 MG tablet Take 1 tablet (200 mg total) by mouth 3 (three) times daily as needed (for pain with urination). 05/26/20 05/26/21  Ceasar Mons, MD  pimecrolimus (ELIDEL) 1 % cream Apply topically 2 (two) times daily. Patient not taking: Reported on 05/21/2020 02/03/20   Valentina Shaggy, MD  sildenafil (REVATIO) 20 MG tablet Take 20 mg by mouth daily as needed (erectile dysfunction). 12/30/18   [provider]  tacrolimus (PROTOPIC) 0.1 % ointment Apply 1 application topically 2 (two) times daily as needed (eczema). (eyelids/hands) 04/30/20   [provider]  tamsulosin (FLOMAX) 0.4 MG CAPS capsule Take 0.4 mg by mouth in the morning.    [provider]  traMADol (ULTRAM) 50 MG tablet Take 1 tablet (50 mg total) by mouth every 6 (six) hours as needed for up to 3 days. 05/26/20 05/29/20  Ceasar Mons, MD  VYVANSE 30 MG capsule Take 30 mg by mouth every morning. 06/21/19   [provider]    Allergies     Isosorbide  Review of Systems   Review of Systems  Constitutional: Negative for fever.  HENT: Negative for sore throat.   Respiratory: Negative for shortness of breath.   Cardiovascular: Negative for chest pain.  Gastrointestinal: Negative for abdominal pain, diarrhea, nausea and vomiting.  Genitourinary: Positive for difficulty urinating, frequency, hematuria and urgency. Negative for dysuria and flank pain.  Musculoskeletal: Negative for back  pain.  Neurological: Negative for light-headedness and headaches.  All other systems reviewed and are negative.   Physical Exam Updated Vital Signs BP 102/75   Pulse 93   Temp 98.1 F (36.7 C) (Oral)   Resp 16   Ht 5\' 10"  (1.778 m)   Wt 80.7 kg   SpO2 98%   BMI 25.54 kg/m   Physical Exam Vitals and nursing note reviewed. Exam conducted with a chaperone present.  Constitutional:      Appearance: Normal appearance.  HENT:     Head: Normocephalic and atraumatic.     Nose: Nose normal.     Mouth/Throat:     Mouth: Mucous membranes are moist.  Eyes:     Pupils: Pupils are equal, round, and reactive to light.  Cardiovascular:     Rate and Rhythm: Normal rate.  Pulmonary:     Effort: Pulmonary effort is normal.     Breath sounds: No wheezing or rales.  Abdominal:     General: Abdomen is flat.     Tenderness: There is no abdominal tenderness. There is no right CVA tenderness or left CVA tenderness.  Genitourinary:    Penis: Circumcised.      Comments: Chaperoned by Lucina Mellow, catheter in place, no blood noted along the tubing.  Bladder scan remarkable for 50 cc's of urine in the bladder.  Musculoskeletal:     Cervical back: Normal range of motion and neck supple.  Skin:    General: Skin is warm and dry.  Neurological:     Mental Status: He is alert and oriented to person, place, and time.     ED Results / Procedures / Treatments   Labs (all labs ordered are listed, but only abnormal results are displayed) Labs Reviewed  - No data to display  EKG None  Radiology No results found.  Procedures Procedures   Medications Ordered in ED Medications  oxybutynin (DITROPAN) tablet 5 mg (5 mg Oral Given 05/26/20 2203)    ED Course  I have reviewed the triage vital signs and the nursing notes.  Pertinent labs & imaging results that were available during my care of the patient were reviewed by me and considered in my medical decision making (see chart for details).    MDM Rules/Calculators/A&P   Patient presents to the ED with a chief complaint of catheter issue, reports he has had no urinary output since this morning after being discharged from his TURP procedure done by Dr. Gilford Rile.  [facility] was instructed to be seen in the ED.  Patient reports after receiving his dialysis treatment, he noted blood clots on his catheter.  Reports that when he urinates there is urine pouring out of his meatus, and stop urinating on the floor according to patient.  He does not have any other complaints.  He was bladder scanned with 50 cc of urine located in his bladder.  9:43 PM Spoke to Urology on call [facility] who recommended oxybutynin for bladder spasms along with flushing of the catheter.   10:09 PM patient reports another episode of urinating outside of his Foley catheter, once I discussed this with the resident on-call, she did recommend replacement for a daily, this will be placed by [place] in the emergency department.  Patient had Foley catheter changed, he originally had a 55 [place] in place, this was replaced by 22 coud by [employer] and myself at the bedside.  Patient has good flushing, good output.  We discussed outpatient  follow-up with urology.  Patient understands and agrees with management, return precautions discussed at length.   Portions of this note were generated with [employer]. Dictation errors may occur despite best attempts at proofreading.  Final Clinical  Impression(s) / ED Diagnoses Final diagnoses:  Obstruction of catheter, initial encounter    Rx / DC Orders ED Discharge Orders    None       Janeece Fitting, Hershal Coria 05/26/20 2250    Milton Ferguson, MD 06/01/20 1227

## 2020-05-27 DIAGNOSIS — N186 End stage renal disease: Secondary | ICD-10-CM | POA: Diagnosis not present

## 2020-05-27 DIAGNOSIS — I132 Hypertensive heart and chronic kidney disease with heart failure and with stage 5 chronic kidney disease, or end stage renal disease: Secondary | ICD-10-CM | POA: Diagnosis not present

## 2020-05-27 DIAGNOSIS — J449 Chronic obstructive pulmonary disease, unspecified: Secondary | ICD-10-CM | POA: Diagnosis not present

## 2020-05-27 DIAGNOSIS — N200 Calculus of kidney: Secondary | ICD-10-CM | POA: Diagnosis not present

## 2020-05-27 DIAGNOSIS — I509 Heart failure, unspecified: Secondary | ICD-10-CM | POA: Diagnosis not present

## 2020-05-27 DIAGNOSIS — Z8673 Personal history of transient ischemic attack (TIA), and cerebral infarction without residual deficits: Secondary | ICD-10-CM | POA: Diagnosis not present

## 2020-05-27 DIAGNOSIS — Z992 Dependence on renal dialysis: Secondary | ICD-10-CM | POA: Diagnosis not present

## 2020-05-27 DIAGNOSIS — Z9884 Bariatric surgery status: Secondary | ICD-10-CM | POA: Diagnosis not present

## 2020-05-27 DIAGNOSIS — Z79899 Other long term (current) drug therapy: Secondary | ICD-10-CM | POA: Diagnosis not present

## 2020-05-27 DIAGNOSIS — M109 Gout, unspecified: Secondary | ICD-10-CM | POA: Diagnosis not present

## 2020-05-27 DIAGNOSIS — Z7682 Awaiting organ transplant status: Secondary | ICD-10-CM | POA: Diagnosis not present

## 2020-05-27 DIAGNOSIS — I10 Essential (primary) hypertension: Secondary | ICD-10-CM | POA: Diagnosis not present

## 2020-05-27 DIAGNOSIS — Z1159 Encounter for screening for other viral diseases: Secondary | ICD-10-CM | POA: Diagnosis not present

## 2020-05-28 DIAGNOSIS — D689 Coagulation defect, unspecified: Secondary | ICD-10-CM | POA: Diagnosis not present

## 2020-05-28 DIAGNOSIS — I129 Hypertensive chronic kidney disease with stage 1 through stage 4 chronic kidney disease, or unspecified chronic kidney disease: Secondary | ICD-10-CM | POA: Diagnosis not present

## 2020-05-28 DIAGNOSIS — E785 Hyperlipidemia, unspecified: Secondary | ICD-10-CM | POA: Diagnosis not present

## 2020-05-28 DIAGNOSIS — M109 Gout, unspecified: Secondary | ICD-10-CM | POA: Diagnosis not present

## 2020-05-28 DIAGNOSIS — J45909 Unspecified asthma, uncomplicated: Secondary | ICD-10-CM | POA: Diagnosis not present

## 2020-05-28 DIAGNOSIS — D509 Iron deficiency anemia, unspecified: Secondary | ICD-10-CM | POA: Diagnosis not present

## 2020-05-28 DIAGNOSIS — N186 End stage renal disease: Secondary | ICD-10-CM | POA: Diagnosis not present

## 2020-05-28 DIAGNOSIS — Z992 Dependence on renal dialysis: Secondary | ICD-10-CM | POA: Diagnosis not present

## 2020-05-28 DIAGNOSIS — N2581 Secondary hyperparathyroidism of renal origin: Secondary | ICD-10-CM | POA: Diagnosis not present

## 2020-05-31 DIAGNOSIS — D631 Anemia in chronic kidney disease: Secondary | ICD-10-CM | POA: Diagnosis not present

## 2020-05-31 DIAGNOSIS — D689 Coagulation defect, unspecified: Secondary | ICD-10-CM | POA: Diagnosis not present

## 2020-05-31 DIAGNOSIS — N186 End stage renal disease: Secondary | ICD-10-CM | POA: Diagnosis not present

## 2020-05-31 DIAGNOSIS — N2581 Secondary hyperparathyroidism of renal origin: Secondary | ICD-10-CM | POA: Diagnosis not present

## 2020-05-31 DIAGNOSIS — Z992 Dependence on renal dialysis: Secondary | ICD-10-CM | POA: Diagnosis not present

## 2020-05-31 DIAGNOSIS — D509 Iron deficiency anemia, unspecified: Secondary | ICD-10-CM | POA: Diagnosis not present

## 2020-06-01 DIAGNOSIS — I129 Hypertensive chronic kidney disease with stage 1 through stage 4 chronic kidney disease, or unspecified chronic kidney disease: Secondary | ICD-10-CM | POA: Diagnosis not present

## 2020-06-01 DIAGNOSIS — Z992 Dependence on renal dialysis: Secondary | ICD-10-CM | POA: Diagnosis not present

## 2020-06-01 DIAGNOSIS — N186 End stage renal disease: Secondary | ICD-10-CM | POA: Diagnosis not present

## 2020-06-02 DIAGNOSIS — D689 Coagulation defect, unspecified: Secondary | ICD-10-CM | POA: Diagnosis not present

## 2020-06-02 DIAGNOSIS — N2581 Secondary hyperparathyroidism of renal origin: Secondary | ICD-10-CM | POA: Diagnosis not present

## 2020-06-02 DIAGNOSIS — Z992 Dependence on renal dialysis: Secondary | ICD-10-CM | POA: Diagnosis not present

## 2020-06-02 DIAGNOSIS — N186 End stage renal disease: Secondary | ICD-10-CM | POA: Diagnosis not present

## 2020-06-04 DIAGNOSIS — Z992 Dependence on renal dialysis: Secondary | ICD-10-CM | POA: Diagnosis not present

## 2020-06-04 DIAGNOSIS — D689 Coagulation defect, unspecified: Secondary | ICD-10-CM | POA: Diagnosis not present

## 2020-06-04 DIAGNOSIS — N186 End stage renal disease: Secondary | ICD-10-CM | POA: Diagnosis not present

## 2020-06-04 DIAGNOSIS — N2581 Secondary hyperparathyroidism of renal origin: Secondary | ICD-10-CM | POA: Diagnosis not present

## 2020-06-07 DIAGNOSIS — Z992 Dependence on renal dialysis: Secondary | ICD-10-CM | POA: Diagnosis not present

## 2020-06-07 DIAGNOSIS — D689 Coagulation defect, unspecified: Secondary | ICD-10-CM | POA: Diagnosis not present

## 2020-06-07 DIAGNOSIS — N186 End stage renal disease: Secondary | ICD-10-CM | POA: Diagnosis not present

## 2020-06-07 DIAGNOSIS — N2581 Secondary hyperparathyroidism of renal origin: Secondary | ICD-10-CM | POA: Diagnosis not present

## 2020-06-07 DIAGNOSIS — D509 Iron deficiency anemia, unspecified: Secondary | ICD-10-CM | POA: Diagnosis not present

## 2020-06-08 ENCOUNTER — Ambulatory Visit: Payer: Medicare Other | Admitting: Allergy & Immunology

## 2020-06-09 DIAGNOSIS — D689 Coagulation defect, unspecified: Secondary | ICD-10-CM | POA: Diagnosis not present

## 2020-06-09 DIAGNOSIS — D509 Iron deficiency anemia, unspecified: Secondary | ICD-10-CM | POA: Diagnosis not present

## 2020-06-09 DIAGNOSIS — N186 End stage renal disease: Secondary | ICD-10-CM | POA: Diagnosis not present

## 2020-06-09 DIAGNOSIS — N2581 Secondary hyperparathyroidism of renal origin: Secondary | ICD-10-CM | POA: Diagnosis not present

## 2020-06-09 DIAGNOSIS — Z992 Dependence on renal dialysis: Secondary | ICD-10-CM | POA: Diagnosis not present

## 2020-06-11 DIAGNOSIS — N186 End stage renal disease: Secondary | ICD-10-CM | POA: Diagnosis not present

## 2020-06-11 DIAGNOSIS — N2581 Secondary hyperparathyroidism of renal origin: Secondary | ICD-10-CM | POA: Diagnosis not present

## 2020-06-11 DIAGNOSIS — Z992 Dependence on renal dialysis: Secondary | ICD-10-CM | POA: Diagnosis not present

## 2020-06-11 DIAGNOSIS — D509 Iron deficiency anemia, unspecified: Secondary | ICD-10-CM | POA: Diagnosis not present

## 2020-06-11 DIAGNOSIS — D689 Coagulation defect, unspecified: Secondary | ICD-10-CM | POA: Diagnosis not present

## 2020-06-14 DIAGNOSIS — Z992 Dependence on renal dialysis: Secondary | ICD-10-CM | POA: Diagnosis not present

## 2020-06-14 DIAGNOSIS — D689 Coagulation defect, unspecified: Secondary | ICD-10-CM | POA: Diagnosis not present

## 2020-06-14 DIAGNOSIS — N2581 Secondary hyperparathyroidism of renal origin: Secondary | ICD-10-CM | POA: Diagnosis not present

## 2020-06-14 DIAGNOSIS — D631 Anemia in chronic kidney disease: Secondary | ICD-10-CM | POA: Diagnosis not present

## 2020-06-14 DIAGNOSIS — D509 Iron deficiency anemia, unspecified: Secondary | ICD-10-CM | POA: Diagnosis not present

## 2020-06-14 DIAGNOSIS — N186 End stage renal disease: Secondary | ICD-10-CM | POA: Diagnosis not present

## 2020-06-16 DIAGNOSIS — Z992 Dependence on renal dialysis: Secondary | ICD-10-CM | POA: Diagnosis not present

## 2020-06-16 DIAGNOSIS — N186 End stage renal disease: Secondary | ICD-10-CM | POA: Diagnosis not present

## 2020-06-16 DIAGNOSIS — D509 Iron deficiency anemia, unspecified: Secondary | ICD-10-CM | POA: Diagnosis not present

## 2020-06-16 DIAGNOSIS — N2581 Secondary hyperparathyroidism of renal origin: Secondary | ICD-10-CM | POA: Diagnosis not present

## 2020-06-16 DIAGNOSIS — D689 Coagulation defect, unspecified: Secondary | ICD-10-CM | POA: Diagnosis not present

## 2020-06-16 DIAGNOSIS — D631 Anemia in chronic kidney disease: Secondary | ICD-10-CM | POA: Diagnosis not present

## 2020-06-18 DIAGNOSIS — N2581 Secondary hyperparathyroidism of renal origin: Secondary | ICD-10-CM | POA: Diagnosis not present

## 2020-06-18 DIAGNOSIS — Z992 Dependence on renal dialysis: Secondary | ICD-10-CM | POA: Diagnosis not present

## 2020-06-18 DIAGNOSIS — N186 End stage renal disease: Secondary | ICD-10-CM | POA: Diagnosis not present

## 2020-06-18 DIAGNOSIS — D509 Iron deficiency anemia, unspecified: Secondary | ICD-10-CM | POA: Diagnosis not present

## 2020-06-18 DIAGNOSIS — D689 Coagulation defect, unspecified: Secondary | ICD-10-CM | POA: Diagnosis not present

## 2020-06-18 DIAGNOSIS — D631 Anemia in chronic kidney disease: Secondary | ICD-10-CM | POA: Diagnosis not present

## 2020-06-21 DIAGNOSIS — D689 Coagulation defect, unspecified: Secondary | ICD-10-CM | POA: Diagnosis not present

## 2020-06-21 DIAGNOSIS — N2581 Secondary hyperparathyroidism of renal origin: Secondary | ICD-10-CM | POA: Diagnosis not present

## 2020-06-21 DIAGNOSIS — D509 Iron deficiency anemia, unspecified: Secondary | ICD-10-CM | POA: Diagnosis not present

## 2020-06-21 DIAGNOSIS — Z992 Dependence on renal dialysis: Secondary | ICD-10-CM | POA: Diagnosis not present

## 2020-06-21 DIAGNOSIS — N186 End stage renal disease: Secondary | ICD-10-CM | POA: Diagnosis not present

## 2020-06-23 DIAGNOSIS — Z992 Dependence on renal dialysis: Secondary | ICD-10-CM | POA: Diagnosis not present

## 2020-06-23 DIAGNOSIS — D689 Coagulation defect, unspecified: Secondary | ICD-10-CM | POA: Diagnosis not present

## 2020-06-23 DIAGNOSIS — N2581 Secondary hyperparathyroidism of renal origin: Secondary | ICD-10-CM | POA: Diagnosis not present

## 2020-06-23 DIAGNOSIS — D509 Iron deficiency anemia, unspecified: Secondary | ICD-10-CM | POA: Diagnosis not present

## 2020-06-23 DIAGNOSIS — N186 End stage renal disease: Secondary | ICD-10-CM | POA: Diagnosis not present

## 2020-06-24 ENCOUNTER — Encounter (HOSPITAL_COMMUNITY): Payer: Self-pay

## 2020-06-24 ENCOUNTER — Other Ambulatory Visit (HOSPITAL_COMMUNITY): Payer: Self-pay | Admitting: Cardiology

## 2020-06-24 ENCOUNTER — Ambulatory Visit (HOSPITAL_COMMUNITY)
Admission: RE | Admit: 2020-06-24 | Discharge: 2020-06-24 | Disposition: A | Discharge status: Home / Self Care | Payer: Medicare Other | Source: Ambulatory Visit | Attending: Cardiology | Admitting: Cardiology

## 2020-06-24 ENCOUNTER — Telehealth (HOSPITAL_COMMUNITY): Payer: Self-pay | Admitting: Cardiology

## 2020-06-24 ENCOUNTER — Other Ambulatory Visit: Payer: Self-pay

## 2020-06-24 VITALS — BP 120/94 | HR 81 | Wt 178.8 lb

## 2020-06-24 DIAGNOSIS — Z8673 Personal history of transient ischemic attack (TIA), and cerebral infarction without residual deficits: Secondary | ICD-10-CM | POA: Insufficient documentation

## 2020-06-24 DIAGNOSIS — I5022 Chronic systolic (congestive) heart failure: Secondary | ICD-10-CM | POA: Diagnosis not present

## 2020-06-24 DIAGNOSIS — Z992 Dependence on renal dialysis: Secondary | ICD-10-CM | POA: Insufficient documentation

## 2020-06-24 DIAGNOSIS — I052 Rheumatic mitral stenosis with insufficiency: Secondary | ICD-10-CM | POA: Insufficient documentation

## 2020-06-24 DIAGNOSIS — I493 Ventricular premature depolarization: Secondary | ICD-10-CM

## 2020-06-24 DIAGNOSIS — R310 Gross hematuria: Secondary | ICD-10-CM | POA: Diagnosis not present

## 2020-06-24 DIAGNOSIS — R002 Palpitations: Secondary | ICD-10-CM

## 2020-06-24 DIAGNOSIS — N400 Enlarged prostate without lower urinary tract symptoms: Secondary | ICD-10-CM | POA: Insufficient documentation

## 2020-06-24 DIAGNOSIS — Z8249 Family history of ischemic heart disease and other diseases of the circulatory system: Secondary | ICD-10-CM | POA: Insufficient documentation

## 2020-06-24 DIAGNOSIS — R000 Tachycardia, unspecified: Secondary | ICD-10-CM | POA: Diagnosis not present

## 2020-06-24 DIAGNOSIS — N186 End stage renal disease: Secondary | ICD-10-CM | POA: Insufficient documentation

## 2020-06-24 DIAGNOSIS — J449 Chronic obstructive pulmonary disease, unspecified: Secondary | ICD-10-CM | POA: Insufficient documentation

## 2020-06-24 DIAGNOSIS — I132 Hypertensive heart and chronic kidney disease with heart failure and with stage 5 chronic kidney disease, or end stage renal disease: Secondary | ICD-10-CM | POA: Diagnosis not present

## 2020-06-24 DIAGNOSIS — I420 Dilated cardiomyopathy: Secondary | ICD-10-CM | POA: Diagnosis not present

## 2020-06-24 DIAGNOSIS — E785 Hyperlipidemia, unspecified: Secondary | ICD-10-CM | POA: Diagnosis not present

## 2020-06-24 DIAGNOSIS — Z79899 Other long term (current) drug therapy: Secondary | ICD-10-CM | POA: Insufficient documentation

## 2020-06-24 LAB — CBC
HCT: 32.5 % — ABNORMAL LOW (ref 39.0–52.0)
Hemoglobin: 9.7 g/dL — ABNORMAL LOW (ref 13.0–17.0)
MCH: 27.8 pg (ref 26.0–34.0)
MCHC: 29.8 g/dL — ABNORMAL LOW (ref 30.0–36.0)
MCV: 93.1 fL (ref 80.0–100.0)
Platelets: 271 10*3/uL (ref 150–400)
RBC: 3.49 MIL/uL — ABNORMAL LOW (ref 4.22–5.81)
RDW: 15.7 % — ABNORMAL HIGH (ref 11.5–15.5)
WBC: 4.6 10*3/uL (ref 4.0–10.5)
nRBC: 0 % (ref 0.0–0.2)

## 2020-06-24 LAB — COMPREHENSIVE METABOLIC PANEL
ALT: 16 U/L (ref 0–44)
AST: 21 U/L (ref 15–41)
Albumin: 3.1 g/dL — ABNORMAL LOW (ref 3.5–5.0)
Alkaline Phosphatase: 44 U/L (ref 38–126)
Anion gap: 9 (ref 5–15)
BUN: 19 mg/dL (ref 6–20)
CO2: 35 mmol/L — ABNORMAL HIGH (ref 22–32)
Calcium: 8.9 mg/dL (ref 8.9–10.3)
Chloride: 97 mmol/L — ABNORMAL LOW (ref 98–111)
Creatinine, Ser: 7.87 mg/dL — ABNORMAL HIGH (ref 0.61–1.24)
GFR, Estimated: 8 mL/min — ABNORMAL LOW (ref >60)
Glucose, Bld: 76 mg/dL (ref 70–99)
Potassium: 3.3 mmol/L — ABNORMAL LOW (ref 3.5–5.1)
Sodium: 141 mmol/L (ref 135–145)
Total Bilirubin: 0.7 mg/dL (ref 0.3–1.2)
Total Protein: 5.8 g/dL — ABNORMAL LOW (ref 6.5–8.1)

## 2020-06-24 LAB — IRON AND TIBC
Iron: 25 ug/dL — ABNORMAL LOW (ref 45–182)
Saturation Ratios: 11 % — ABNORMAL LOW (ref 17.9–39.5)
TIBC: 218 ug/dL — ABNORMAL LOW (ref 250–450)
UIBC: 193 ug/dL

## 2020-06-24 LAB — LIPID PANEL
Cholesterol: 118 mg/dL (ref 0–200)
HDL: 48 mg/dL (ref >40)
LDL Cholesterol: 61 mg/dL (ref 0–99)
Total CHOL/HDL Ratio: 2.5 RATIO
Triglycerides: 45 mg/dL (ref <150)
VLDL: 9 mg/dL (ref 0–40)

## 2020-06-24 LAB — MAGNESIUM: Magnesium: 2.5 mg/dL — ABNORMAL HIGH (ref 1.7–2.4)

## 2020-06-24 LAB — FERRITIN: Ferritin: 509 ng/mL — ABNORMAL HIGH (ref 24–336)

## 2020-06-24 LAB — TSH: TSH: 0.767 u[IU]/mL (ref 0.350–4.500)

## 2020-06-24 NOTE — Patient Instructions (Addendum)
EKG done today.  Labs done today. We will contact you only if your labs are abnormal.  No medication changes were made. Please continue all current medications as prescribed.  Your provider has recommended that  you wear a Zio Patch for 14 days.  This monitor will record your heart rhythm for our review.  IF you have any symptoms while wearing the monitor please press the button.  If you have any issues with the patch or you notice a red or orange light on it please call the company at [phone].  Once you remove the patch please mail it back to the company as soon as possible so we can get the results.  Your physician recommends that you schedule a follow-up appointment in: 2-3 weeks for an appointment with an echo prior to your exam.  If you have any questions or concerns before your next appointment please send [place] a message through [place] or call our office at [phone].    TO LEAVE A MESSAGE FOR THE NURSE SELECT OPTION 2, PLEASE LEAVE A MESSAGE INCLUDING: YOUR NAME DATE OF BIRTH CALL BACK NUMBER REASON FOR CALL**this is important as we prioritize the call backs  YOU WILL RECEIVE A CALL BACK THE SAME DAY AS LONG AS YOU CALL BEFORE 4:00 PM   Do the following things EVERYDAY: Weigh yourself in the morning before breakfast. Write it down and keep it in a log. Take your medicines as prescribed Eat low salt foods--Limit salt (sodium) to 2000 mg per day.  Stay as active as you can everyday Limit all fluids for the day to less than 2 liters   At the [facility], you and your health needs are our priority. As part of our continuing mission to provide you with exceptional heart care, we have created designated Provider Care Teams. These Care Teams include your primary Cardiologist (physician) and Advanced Practice Providers (APPs- Physician Assistants and Nurse Practitioners) who all work together to provide you with the care you need, when you need it.   You may  see any of the following providers on your designated Care Team at your next follow up: Dr Glori Bickers Dr Haynes Kerns, NP Lyda Jester, [place] Audry Riles, PharmD   Please be sure to bring in all your medications bottles to every appointment.

## 2020-06-24 NOTE — Progress Notes (Signed)
Heart Failure  Note  ID:  Vernon Blackburn, Vernon Blackburn [birth year], MRN [id number] Provider location: [facility] Type of Visit: Established patient   PCP:  [name], MD  Cardiologist:  [name]  Reason for Visit: Work in for [place] and Near Syncope    History of Present Illness: Vernon Blackburn is a 55 y.o. male who has a history of long-standing, poorly-controlled HTN, CKD stage 3-4, chronic systolic CHF and severe mitral regurgitation.  He presents for followup of CHF and HTN.  He has had hypertension for years.  He has had known CKD, followed by Dr. [name].  He developed severe dyspnea in 12/17 and was admitted with acute systolic CHF.  Echo showed EF 20-25% with severe MR.  He was diuresed and discharged.   TEE was done in 5/18 to assess mitral valve.  There was severe MR.  The MV was thickened and did not coapt well.  Suspect there was a component of secondary MR with moderate LV dilation, however the thickened leaflets suggested a component of primary MR as well.   He had right and left heart cath in 6/18.  No significant CAD, elevated filling pressures.    In 6/18, he had mitral valve repair.  Post-op echo in 7/18 showed EF 25-30% with stable MV repair (mild MR, mean gradient 4 mmHg).  Echo 12/18 with EF 30-35%, moderate LV dilation, s/p MVR repair with trivial MR.  Echo in 4/19 showed EF up to 40-45% with stable repaired mitral valve.   He had an echo in 8/20 with EF 35%, mild LV dilation, s/p MV repair with mean gradient 4 mmHg and trivial MR, mildly decreased RV systolic function.   He was admitted in 2/21 to [facility] with worsening renal function and HD was started.  Echo at [facility] in 2/21 showed EF 40-45%, mild LV dilation, moderate LVH, "severe mitral stenosis" with mean gradient 7 mmHg and eccentric mild MR.  Head imagining showed an old CVA.   TEE was done to followup on ?severe mitral stenosis in 3/21.  This showed EF 45%, moderate LVH, global  mild hypokinesis, mildly decreased RV systolic function; s/p MV repair, mild MR with mean gradient 4 mmHg, no significant mitral stenosis.  CPX in 11/21 showed peak VO2 17.7 (51% predicted), RER 1.12, VE/VCO2 slope 29.  Moderate functional limitation due to mixed restrictive/obstructive lung disease. He was referred to pulmonology.   He is now getting HD and is being worked up at [facility] for renal transplant.    5/22, he underwent TURP for BPH and has been having hematuria post procedure. Peri procedure labs showed anemia w/ Hgb 8.7. This was down from prior baseline ~12. No repeat labs since.   He presents to clinic today as a work in for tachycardia, palpitations and near syncope. Had dialysis today and shortly after session, had HR in the 140s w/ low BP and felt like he was going to pass out on the way home. Vision was also blurry. Denies CP. No dyspnea. EKG shows NSR. HR 78 bpm. BP 120/94.     Labs (4/18): K 3.7, creatinine 3.05, hgb 10.4 Labs (5/18): K 3.3, creatinine 2.89, BNP 789 Labs (6/18): K 3.4, creatinine 2.4, hgb 10.1 Labs (7/18): K 3.2, creatinine 2.27, BNP 303 Labs (9/18): LDL 78, HDL 56, K 3.8, creatinine 2.16 Labs (10/18): hgb 10.1 Labs (2/19): K 3.3, creatinine 2.75, hgb 12.1 Labs (5/19): K 3.6, creatinine 3.07 Labs (7/20): K 4, creatinine 3.55,  hgb 12.8 Labs (2/21): LDL 127 Labs (5/22): Hgb 8.7   ECG (personally reviewed): NSR 78 bpm   PMH: 1. Gout 2. HTN: Long-standing, poor control. 3. ESRD.  Likely hypertensive nephropathy.  4. Dilated cardiomyopathy: May be due to long-standing HTN.  - Echo (1/18): EF 20-25%, severe MR - Echo (2/18): EF 25%, severe MR.  - TEE (5/18): Moderate LV dilation with severe global hypokinesis, EF 25-30%, normal RV size with mildly decreased systolic function, RV-RA gradient 55 mmHg, severe MR with mildly thickened leaflets with inadequate coaptation and ERO 0.52 cm^2 by PISA and systolic flow reversal in the pulmonary vein doppler pattern  => secondary MR from dilated LV but thickened leaflets lead to concern for component of primary MR as well.  - RHC/LHC (6/18): No significant CAD.  Mean RA 15, PA 76/31 mean 48, mean PCWP 45, CI 3.02 Fick, 2.64 thermo.  - Echo (7/18): EF 25-30%, mild dilation, diffuse hypokinesis, s/p MV repair with mean gradient 4 mmHg and mild MR, PASP 37 mmHg, normal RV size and systolic function.  - Echo (12/18): EF 30-35%, moderate LV dilation with diffuse hypokinesis, stable MV repair.  - Echo (4/19): EF 40-45%, severe LV dilation, s/p mitral valve repair with mean gradient 4 mmHg, no mitral regurgitation, severe RV dilation with normal systolic function.  - Echo (8/20): EF 35%, mild LV dilation, s/p MV repair with mean gradient 4 mmHg and trivial MR, mildly decreased RV systolic function.  - Echo (2/21, [facility]):  EF 40-45%, mild LV dilation, moderate LVH, "severe mitral stenosis" with mean gradient 7 mmHg and eccentric mild MR. - TEE (3/21): EF 45%, moderate LVH, global mild hypokinesis, mildly decreased RV systolic function; s/p MV repair, mild MR with mean gradient 4 mmHg, no significant mitral stenosis.  - CPX (11/21): peak VO2 17.7 (51% predicted), RER 1.12, VE/VCO2 slope 29.  Moderate functional limitation due to mixed restrictive/obstructive lung disease.  5. Severe MR: S/p MV repair in 6/18. 6. H/o CVA  SH: Married, lives in [place], nonsmoker, no drugs, occasional ETOH.  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   Family History  Problem Relation Age of Onset   Diabetes Mother    Hypertension Mother    Heart failure Mother    Colon cancer Neg Hx    Esophageal cancer Neg Hx    Rectal cancer Neg Hx    Stomach cancer Neg Hx    ROS: All systems reviewed and negative except as per HPI.   Current Outpatient Medications  Medication Sig Dispense Refill   Albuterol Sulfate (PROAIR RESPICLICK) 242 (90 Base) MCG/ACT AEPB Inhale 2 puffs into the lungs every 6 (six) hours as needed  (wheezing/shortness of breath).     atorvastatin (LIPITOR) 20 MG tablet Take 20 mg by mouth at bedtime.     Budeson-Glycopyrrol-Formoterol (BREZTRI AEROSPHERE) 160-9-4.8 MCG/ACT AERO Inhale 2 puffs into the lungs 2 (two) times daily as needed (congestion/respiratory issues.).     calcitRIOL (ROCALTROL) 0.5 MCG capsule Take 0.5 mcg by mouth every Monday, Wednesday, and Friday.     calcium acetate (PHOSLO) 667 MG capsule Take 2,668 mg by mouth 3 (three) times daily with meals.     carvedilol (COREG) 12.5 MG tablet Take 12.5 mg by mouth daily at 6 (six) AM.     fexofenadine (ALLEGRA) 180 MG tablet Take 180 mg by mouth in the morning.     fluocinonide ointment (LIDEX) 3.53 % Apply 1 application topically 2 (two) times daily as needed (eczema). (  Hands)     fluticasone (FLONASE) 50 MCG/ACT nasal spray Place 1 spray into both nostrils daily as needed for allergies or rhinitis.      phenazopyridine (PYRIDIUM) 200 MG tablet Take 1 tablet (200 mg total) by mouth 3 (three) times daily as needed (for pain with urination). 30 tablet 0   pimecrolimus (ELIDEL) 1 % cream Apply topically 2 (two) times daily. 100 g 2   sildenafil (REVATIO) 20 MG tablet Take 20 mg by mouth daily as needed (erectile dysfunction).     tacrolimus (PROTOPIC) 0.1 % ointment Apply 1 application topically 2 (two) times daily as needed (eczema). (eyelids/hands)     tamsulosin (FLOMAX) 0.4 MG CAPS capsule Take 0.4 mg by mouth in the morning.     VYVANSE 30 MG capsule Take 30 mg by mouth every morning.     No current facility-administered medications for this encounter.   Exam:   BP (!) 120/94   Pulse 81   Wt 81.1 kg (178 lb 12.8 oz)   SpO2 98%   BMI 25.66 kg/m  General:  Well appearing, thin. No respiratory difficulty HEENT: normal Neck: supple. no JVD. Carotids 2+ bilat; no bruits. No lymphadenopathy or thyromegaly appreciated. Cor: PMI nondisplaced. Regular rate & rhythm. 2/6 MR murmur  Lungs: clear Abdomen: soft, nontender,  nondistended. No hepatosplenomegaly. No bruits or masses. Good bowel sounds. Extremities: no cyanosis, clubbing, rash, edema Neuro: alert & oriented x 3, cranial nerves grossly intact. moves all 4 extremities w/o difficulty. Affect pleasant.   Assessment/Plan: 1. Tachypalpitations: recent episodes of tachypalpitations w/ low BP and near syncope. EKG today shows NSR 78 bpm. BP normotensive. No associated CP. Need to r/o cardiac etiology. Plan 2 week Zio monitor and repeat echo. Will also obtain labs including CBC w/ Iron studies (recent anemia post TURP procedure w/ ongoing hematuria). Check TSH, CMP and Mg level.  2. HTN: Possible hypertensive cardiomyopathy and hypertensive nephropathy.   - BP controlled with Coreg.  3. ESRD: on HD, working up for renal transplant at [facility].  4. Chronic systolic CHF: Echo in 1/95 with EF 25-30%. Nonischemic cardiomyopathy based on cath in 6/18.  Cause of cardiomyopathy may be long-standing, poorly controlled HTN.  NYHA class II symptoms.  He improved symptomatically s/p MV repair.  Echo in 4/19 with EF 40-45%. Echo at [facility] in 2/21 showed EF 40-45%.   TEE in 3/21 with EF 45%.  NYHA class II. CPX in 11/21 with peak VO2 17.7 (51% predicted), RER 1.12, VE/VCO2 slope 29.  Moderate functional limitation due to mixed restrictive/obstructive lung disease. He has been referred to pulmonology. NYHA Class II. Euvolemic on exam, may be a little dry Volume controlled through HD.  - Continue Coreg 12.5 mg bid. - Unable to add additional cardiac meds due to fall in BP with HD.  5. Mitral regurgitation: s/p MV repair for severe MR.  Much improved symptomatically s/p surgery. Echo at [facility] in 2/21 suggested "severe" mitral stenosis with mean gradient 7 mmHg and eccentric MR, ?mild.  However, TEE here in 3/21 showed stable MV repair with mild MR and no significant mitral stenosis.  - given recent tachypalpitations + near syncope, will repeat echo and order 2 week Zio monitor  - Will need  abx with dental work given prosthetic material.  6. Asthma: Occasional wheezing, not exertional.  Abnormal CPX as above suggesting lung pathology.  He has been referred pulmonary. Now on bronchodilators  6. CVA: Prior CVA by head imaging.   - concern for  potential undiagnosed AFib given recent symptoms, Plan Zio monitor  7. Hyperlipidemia: on Lipitor - check LP + LFTs  8. BPH: s/p TURP 5/22, complaining of ongoing hematuria.  - repeat CBC + iron studies given symptoms and recent anemia - advised that he f/u sooner w/ [facility] given ongoing hematuria    F/u w/ APP in 2-3 weeks, after monitor is resulted   Signed, Lyda Jester, PA-C  06/24/2020  [facility] [phone] (office) [phone] (fax)

## 2020-06-24 NOTE — Telephone Encounter (Signed)
Patient called to report increased HR shortly after dialysis (144). B/p low, nearly passed out on the way home.  Pt would like to be seen by cardiology to rule out any cardiac abnormalities since pt is on the transplant list      Advised he should notify nephrology as well as changes to dialysis settings could be needed-pt report he already reached out

## 2020-06-25 DIAGNOSIS — D509 Iron deficiency anemia, unspecified: Secondary | ICD-10-CM | POA: Diagnosis not present

## 2020-06-25 DIAGNOSIS — N186 End stage renal disease: Secondary | ICD-10-CM | POA: Diagnosis not present

## 2020-06-25 DIAGNOSIS — Z992 Dependence on renal dialysis: Secondary | ICD-10-CM | POA: Diagnosis not present

## 2020-06-25 DIAGNOSIS — D689 Coagulation defect, unspecified: Secondary | ICD-10-CM | POA: Diagnosis not present

## 2020-06-25 DIAGNOSIS — N2581 Secondary hyperparathyroidism of renal origin: Secondary | ICD-10-CM | POA: Diagnosis not present

## 2020-06-28 DIAGNOSIS — D509 Iron deficiency anemia, unspecified: Secondary | ICD-10-CM | POA: Diagnosis not present

## 2020-06-28 DIAGNOSIS — N2581 Secondary hyperparathyroidism of renal origin: Secondary | ICD-10-CM | POA: Diagnosis not present

## 2020-06-28 DIAGNOSIS — Z992 Dependence on renal dialysis: Secondary | ICD-10-CM | POA: Diagnosis not present

## 2020-06-28 DIAGNOSIS — D689 Coagulation defect, unspecified: Secondary | ICD-10-CM | POA: Diagnosis not present

## 2020-06-28 DIAGNOSIS — N186 End stage renal disease: Secondary | ICD-10-CM | POA: Diagnosis not present

## 2020-06-28 DIAGNOSIS — D631 Anemia in chronic kidney disease: Secondary | ICD-10-CM | POA: Diagnosis not present

## 2020-06-30 DIAGNOSIS — D689 Coagulation defect, unspecified: Secondary | ICD-10-CM | POA: Diagnosis not present

## 2020-06-30 DIAGNOSIS — D509 Iron deficiency anemia, unspecified: Secondary | ICD-10-CM | POA: Diagnosis not present

## 2020-06-30 DIAGNOSIS — N2581 Secondary hyperparathyroidism of renal origin: Secondary | ICD-10-CM | POA: Diagnosis not present

## 2020-06-30 DIAGNOSIS — D631 Anemia in chronic kidney disease: Secondary | ICD-10-CM | POA: Diagnosis not present

## 2020-06-30 DIAGNOSIS — Z992 Dependence on renal dialysis: Secondary | ICD-10-CM | POA: Diagnosis not present

## 2020-06-30 DIAGNOSIS — N186 End stage renal disease: Secondary | ICD-10-CM | POA: Diagnosis not present

## 2020-07-01 DIAGNOSIS — N186 End stage renal disease: Secondary | ICD-10-CM | POA: Diagnosis not present

## 2020-07-01 DIAGNOSIS — I129 Hypertensive chronic kidney disease with stage 1 through stage 4 chronic kidney disease, or unspecified chronic kidney disease: Secondary | ICD-10-CM | POA: Diagnosis not present

## 2020-07-01 DIAGNOSIS — Z992 Dependence on renal dialysis: Secondary | ICD-10-CM | POA: Diagnosis not present

## 2020-07-02 DIAGNOSIS — D631 Anemia in chronic kidney disease: Secondary | ICD-10-CM | POA: Diagnosis not present

## 2020-07-02 DIAGNOSIS — D689 Coagulation defect, unspecified: Secondary | ICD-10-CM | POA: Diagnosis not present

## 2020-07-02 DIAGNOSIS — Z992 Dependence on renal dialysis: Secondary | ICD-10-CM | POA: Diagnosis not present

## 2020-07-02 DIAGNOSIS — N2581 Secondary hyperparathyroidism of renal origin: Secondary | ICD-10-CM | POA: Diagnosis not present

## 2020-07-02 DIAGNOSIS — R110 Nausea: Secondary | ICD-10-CM | POA: Diagnosis not present

## 2020-07-02 DIAGNOSIS — D509 Iron deficiency anemia, unspecified: Secondary | ICD-10-CM | POA: Diagnosis not present

## 2020-07-02 DIAGNOSIS — N186 End stage renal disease: Secondary | ICD-10-CM | POA: Diagnosis not present

## 2020-07-05 DIAGNOSIS — D631 Anemia in chronic kidney disease: Secondary | ICD-10-CM | POA: Diagnosis not present

## 2020-07-05 DIAGNOSIS — N186 End stage renal disease: Secondary | ICD-10-CM | POA: Diagnosis not present

## 2020-07-05 DIAGNOSIS — Z992 Dependence on renal dialysis: Secondary | ICD-10-CM | POA: Diagnosis not present

## 2020-07-05 DIAGNOSIS — D509 Iron deficiency anemia, unspecified: Secondary | ICD-10-CM | POA: Diagnosis not present

## 2020-07-05 DIAGNOSIS — N2581 Secondary hyperparathyroidism of renal origin: Secondary | ICD-10-CM | POA: Diagnosis not present

## 2020-07-05 DIAGNOSIS — R110 Nausea: Secondary | ICD-10-CM | POA: Diagnosis not present

## 2020-07-05 DIAGNOSIS — D689 Coagulation defect, unspecified: Secondary | ICD-10-CM | POA: Diagnosis not present

## 2020-07-07 DIAGNOSIS — D689 Coagulation defect, unspecified: Secondary | ICD-10-CM | POA: Diagnosis not present

## 2020-07-07 DIAGNOSIS — N186 End stage renal disease: Secondary | ICD-10-CM | POA: Diagnosis not present

## 2020-07-07 DIAGNOSIS — R110 Nausea: Secondary | ICD-10-CM | POA: Diagnosis not present

## 2020-07-07 DIAGNOSIS — D631 Anemia in chronic kidney disease: Secondary | ICD-10-CM | POA: Diagnosis not present

## 2020-07-07 DIAGNOSIS — Z992 Dependence on renal dialysis: Secondary | ICD-10-CM | POA: Diagnosis not present

## 2020-07-07 DIAGNOSIS — N2581 Secondary hyperparathyroidism of renal origin: Secondary | ICD-10-CM | POA: Diagnosis not present

## 2020-07-07 DIAGNOSIS — D509 Iron deficiency anemia, unspecified: Secondary | ICD-10-CM | POA: Diagnosis not present

## 2020-07-08 ENCOUNTER — Other Ambulatory Visit (HOSPITAL_COMMUNITY): Payer: Self-pay | Admitting: Nurse Practitioner

## 2020-07-08 ENCOUNTER — Encounter (HOSPITAL_COMMUNITY): Payer: Self-pay | Admitting: Radiology

## 2020-07-08 DIAGNOSIS — R338 Other retention of urine: Secondary | ICD-10-CM | POA: Diagnosis not present

## 2020-07-08 DIAGNOSIS — R339 Retention of urine, unspecified: Secondary | ICD-10-CM

## 2020-07-08 NOTE — Progress Notes (Signed)
Patient Name  Vernon Blackburn, Vernon Blackburn Legal Sex  Male DOB  November 06, 1965 SSN  [id number] Address  [address] [address] Phone  [phone] [facility])  [phone] (Mobile)     RE: CT Drain Placement / Supra-pubic tube placement Received: Today Corrie Mckusick, DO  Garth Bigness D Yes thanks.   CT guided supra-pubic catheter.   Earleen Newport         Previous Messages    ----- Message -----  From: Garth Bigness D  Sent: 07/08/2020   1:43 PM EDT  To: Corrie Mckusick, DO  Subject: RE: CT Drain Placement / Supra-pubic tube pl*   Hi, do you want me to schedule under CT Guidance or send to Caryl Pina and Anderson Malta to do in IR.  ----- Message -----  From: Corrie Mckusick, DO  Sent: 07/08/2020   1:32 PM EDT  To: Jillyn Hidden  Subject: RE: CT Drain Placement / Supra-pubic tube pl*   OK for image guided supra-pubic catheter.     Peritoneal dialysis.   Earleen Newport   ----- Message -----  From: Garth Bigness D  Sent: 07/08/2020  12:41 PM EDT  To: Ir Procedure Requests  Subject: CT Drain Placement / Supra-pubic tube placem*   Procedure:  CT Drain Placement / Supra-pubic tube placement   Reason:  urinary retention   History:  CT in computer   Provider:  Karen Kays   Provider Contact:    [phone]

## 2020-07-09 ENCOUNTER — Ambulatory Visit (HOSPITAL_COMMUNITY)
Admission: RE | Admit: 2020-07-09 | Discharge: 2020-07-09 | Disposition: A | Discharge status: Home / Self Care | Payer: Medicare Other | Source: Ambulatory Visit | Attending: Nurse Practitioner | Admitting: Nurse Practitioner

## 2020-07-09 ENCOUNTER — Other Ambulatory Visit: Payer: Self-pay

## 2020-07-09 ENCOUNTER — Other Ambulatory Visit: Payer: Self-pay | Admitting: Radiology

## 2020-07-09 ENCOUNTER — Encounter (HOSPITAL_COMMUNITY): Payer: Self-pay

## 2020-07-09 DIAGNOSIS — D631 Anemia in chronic kidney disease: Secondary | ICD-10-CM | POA: Diagnosis not present

## 2020-07-09 DIAGNOSIS — Z7682 Awaiting organ transplant status: Secondary | ICD-10-CM | POA: Diagnosis not present

## 2020-07-09 DIAGNOSIS — K5900 Constipation, unspecified: Secondary | ICD-10-CM | POA: Diagnosis not present

## 2020-07-09 DIAGNOSIS — M47817 Spondylosis without myelopathy or radiculopathy, lumbosacral region: Secondary | ICD-10-CM | POA: Diagnosis not present

## 2020-07-09 DIAGNOSIS — J449 Chronic obstructive pulmonary disease, unspecified: Secondary | ICD-10-CM | POA: Diagnosis not present

## 2020-07-09 DIAGNOSIS — N186 End stage renal disease: Secondary | ICD-10-CM | POA: Diagnosis not present

## 2020-07-09 DIAGNOSIS — D689 Coagulation defect, unspecified: Secondary | ICD-10-CM | POA: Diagnosis not present

## 2020-07-09 DIAGNOSIS — Z8249 Family history of ischemic heart disease and other diseases of the circulatory system: Secondary | ICD-10-CM | POA: Diagnosis not present

## 2020-07-09 DIAGNOSIS — R110 Nausea: Secondary | ICD-10-CM | POA: Diagnosis not present

## 2020-07-09 DIAGNOSIS — N2581 Secondary hyperparathyroidism of renal origin: Secondary | ICD-10-CM | POA: Diagnosis not present

## 2020-07-09 DIAGNOSIS — N19 Unspecified kidney failure: Secondary | ICD-10-CM | POA: Diagnosis not present

## 2020-07-09 DIAGNOSIS — N452 Orchitis: Secondary | ICD-10-CM | POA: Diagnosis not present

## 2020-07-09 DIAGNOSIS — R338 Other retention of urine: Secondary | ICD-10-CM | POA: Diagnosis not present

## 2020-07-09 DIAGNOSIS — R188 Other ascites: Secondary | ICD-10-CM | POA: Diagnosis not present

## 2020-07-09 DIAGNOSIS — I5042 Chronic combined systolic (congestive) and diastolic (congestive) heart failure: Secondary | ICD-10-CM | POA: Diagnosis not present

## 2020-07-09 DIAGNOSIS — Z9111 Patient's noncompliance with dietary regimen: Secondary | ICD-10-CM | POA: Diagnosis not present

## 2020-07-09 DIAGNOSIS — R339 Retention of urine, unspecified: Secondary | ICD-10-CM | POA: Insufficient documentation

## 2020-07-09 DIAGNOSIS — N3289 Other specified disorders of bladder: Secondary | ICD-10-CM | POA: Diagnosis not present

## 2020-07-09 DIAGNOSIS — Z992 Dependence on renal dialysis: Secondary | ICD-10-CM | POA: Diagnosis not present

## 2020-07-09 DIAGNOSIS — I428 Other cardiomyopathies: Secondary | ICD-10-CM | POA: Diagnosis not present

## 2020-07-09 DIAGNOSIS — M1A9XX0 Chronic gout, unspecified, without tophus (tophi): Secondary | ICD-10-CM | POA: Diagnosis not present

## 2020-07-09 DIAGNOSIS — R140 Abdominal distension (gaseous): Secondary | ICD-10-CM | POA: Diagnosis not present

## 2020-07-09 DIAGNOSIS — D509 Iron deficiency anemia, unspecified: Secondary | ICD-10-CM | POA: Diagnosis not present

## 2020-07-09 DIAGNOSIS — N35919 Unspecified urethral stricture, male, unspecified site: Secondary | ICD-10-CM | POA: Diagnosis not present

## 2020-07-09 DIAGNOSIS — Z833 Family history of diabetes mellitus: Secondary | ICD-10-CM | POA: Diagnosis not present

## 2020-07-09 DIAGNOSIS — Z28310 Unvaccinated for covid-19: Secondary | ICD-10-CM | POA: Diagnosis not present

## 2020-07-09 DIAGNOSIS — I132 Hypertensive heart and chronic kidney disease with heart failure and with stage 5 chronic kidney disease, or end stage renal disease: Secondary | ICD-10-CM | POA: Diagnosis not present

## 2020-07-09 DIAGNOSIS — Z8673 Personal history of transient ischemic attack (TIA), and cerebral infarction without residual deficits: Secondary | ICD-10-CM | POA: Diagnosis not present

## 2020-07-09 DIAGNOSIS — Z888 Allergy status to other drugs, medicaments and biological substances status: Secondary | ICD-10-CM | POA: Diagnosis not present

## 2020-07-09 DIAGNOSIS — Z20822 Contact with and (suspected) exposure to covid-19: Secondary | ICD-10-CM | POA: Diagnosis not present

## 2020-07-09 HISTORY — DX: Dyspnea, unspecified: R06.00

## 2020-07-09 LAB — CBC WITH DIFFERENTIAL/PLATELET
Abs Immature Granulocytes: 0.01 10*3/uL (ref 0.00–0.07)
Basophils Absolute: 0.1 10*3/uL (ref 0.0–0.1)
Basophils Relative: 1 %
Eosinophils Absolute: 0.6 10*3/uL — ABNORMAL HIGH (ref 0.0–0.5)
Eosinophils Relative: 10 %
HCT: 40.5 % (ref 39.0–52.0)
Hemoglobin: 12.3 g/dL — ABNORMAL LOW (ref 13.0–17.0)
Immature Granulocytes: 0 %
Lymphocytes Relative: 22 %
Lymphs Abs: 1.2 10*3/uL (ref 0.7–4.0)
MCH: 28.1 pg (ref 26.0–34.0)
MCHC: 30.4 g/dL (ref 30.0–36.0)
MCV: 92.7 fL (ref 80.0–100.0)
Monocytes Absolute: 0.4 10*3/uL (ref 0.1–1.0)
Monocytes Relative: 7 %
Neutro Abs: 3.3 10*3/uL (ref 1.7–7.7)
Neutrophils Relative %: 60 %
Platelets: 253 10*3/uL (ref 150–400)
RBC: 4.37 MIL/uL (ref 4.22–5.81)
RDW: 16.9 % — ABNORMAL HIGH (ref 11.5–15.5)
WBC: 5.6 10*3/uL (ref 4.0–10.5)
nRBC: 0 % (ref 0.0–0.2)

## 2020-07-09 LAB — BASIC METABOLIC PANEL
Anion gap: 11 (ref 5–15)
BUN: 34 mg/dL — ABNORMAL HIGH (ref 6–20)
CO2: 32 mmol/L (ref 22–32)
Calcium: 8.9 mg/dL (ref 8.9–10.3)
Chloride: 97 mmol/L — ABNORMAL LOW (ref 98–111)
Creatinine, Ser: 9.79 mg/dL — ABNORMAL HIGH (ref 0.61–1.24)
GFR, Estimated: 6 mL/min — ABNORMAL LOW (ref >60)
Glucose, Bld: 87 mg/dL (ref 70–99)
Potassium: 4.1 mmol/L (ref 3.5–5.1)
Sodium: 140 mmol/L (ref 135–145)

## 2020-07-09 MED ORDER — SODIUM CHLORIDE 0.9 % IV SOLN
INTRAVENOUS | Status: AC
  Filled 2020-07-09: qty 250

## 2020-07-09 MED ORDER — FENTANYL CITRATE (PF) 100 MCG/2ML IJ SOLN
INTRAMUSCULAR | Status: AC
  Filled 2020-07-09: qty 2

## 2020-07-09 MED ORDER — SODIUM CHLORIDE 0.9% FLUSH: 5.0000 mL | Freq: Three times a day (TID) | INTRAVENOUS | Status: DC

## 2020-07-09 MED ORDER — FENTANYL CITRATE (PF) 100 MCG/2ML IJ SOLN
INTRAMUSCULAR | Status: AC | PRN
  Administered 2020-07-09: 50 ug via INTRAVENOUS

## 2020-07-09 MED ORDER — MIDAZOLAM HCL 2 MG/2ML IJ SOLN
INTRAMUSCULAR | Status: AC
  Filled 2020-07-09: qty 4

## 2020-07-09 MED ORDER — MIDAZOLAM HCL 2 MG/2ML IJ SOLN
INTRAMUSCULAR | Status: AC | PRN
  Administered 2020-07-09: 1 mg via INTRAVENOUS

## 2020-07-09 MED ORDER — LIDOCAINE HCL (PF) 1 % IJ SOLN
INTRAMUSCULAR | Status: AC | PRN
  Administered 2020-07-09: 10 mL

## 2020-07-09 MED ORDER — HYDROCODONE-ACETAMINOPHEN 5-325 MG PO TABS
1.0000 | ORAL_TABLET | ORAL | Status: DC | PRN
Start: 2020-07-09 — End: 2020-07-10

## 2020-07-09 MED ORDER — SODIUM CHLORIDE 0.9 % IV SOLN: INTRAVENOUS | Status: DC

## 2020-07-09 NOTE — Discharge Instructions (Signed)
Urgent needs - Interventional Radiology on call MD [phone]  Wound -  Keep site clean and dry.  Do not submerge in tub or water.   Follow up with the physician who ordered your catheter placement for care of catheter and further instructions.

## 2020-07-09 NOTE — Consult Note (Signed)
Chief Complaint: Patient was seen in consultation today for CT-guided suprapubic catheter placement  Referring Physician(s): Gibson,Larry Ryan,NP  Supervising Physician: Arne Cleveland  Patient Status: [facility] - Out-pt  History of Present Illness: Vernon Blackburn is a 55 y.o. male with multiple medical problems including ADHD, anemia, asthma, gout, heart failure, end-stage renal disease with left arm AV fistula and prior peritoneal dialysis, prior CVA, nephrolithiasis, sleep apnea, hypertension, nonischemic cardiomyopathy, mitral valve repair.  He is status post TURP for BPH on 05/25/2020 and since that time he has had difficulty voiding/hematuria with recent unsuccessful Foley catheter placement by urology in office.  Bedside cystoscopy at urology office 7/7 revealed near complete anterior urethral stricture and guidewire was unable to be passed.  He is having increasing lower abdominal pain/distention as well as constipation along with right testicular swelling/orchitis . He presents today for CT-guided suprapubic catheter placement.   Past Medical History:  Diagnosis Date   ADHD    Anemia, chronic renal failure    Asthma    followed by pcp   Benign localized prostatic hyperplasia with lower urinary tract symptoms (LUTS)    Bladder stones    BPH (benign prostatic hyperplasia)    Chronic gout    05-06-2020  per pt last episode 6 months ago,  (involves right knee and right great toe)   Chronic systolic HF (heart failure) ([place])    followed by cardiology, dr Aundra Dubin @ CHF clinic,   last TEE 03-06-2019 in epic ef 45%   Dyspnea    ESRD on hemodialysis [facility])    primary nephrologist--- dr Moshe Cipro--- HD on M/W/ F  at [facility] in [place] murmur    History of bladder stone    History of cellulitis 01/27/2020   ED visit ,  abdominal wall   History of CVA (cerebrovascular accident) without residual deficits    remote infarct per imaging MRI brain in  care every where 02-07-2019   History of kidney stones    History of obstructive sleep apnea    05-06-2020  per pt test positive for osa prior to gastric bypass in 2014 used cpap,  pt stated was retested after alot of weight loss , test was negative    Hypertension    followed by cardiology   NICM (nonischemic cardiomyopathy) ([place])    followed by cardiology---- 01/ 2018  ef 20-35%, severe MV stenosis;  last TEE echo in epic 03-06-2019  ef 45%   Peripheral edema    Bilateral lower extremity    S/P minimally invasive mitral valve repair followed by cardiology   06-29-2016  for severe stenosis ;  30 mm [name] via right mini thoracotomy approach;  last TEE echo in epic 03-06-2019 ef 45%, mild LVH, post op mild MR with mean gradient 54mmHg and no significant stenosis   Seasonal and perennial allergic rhinitis    Sleep apnea     Past Surgical History:  Procedure Laterality Date   AV FISTULA PLACEMENT Left 03/10/2019   Procedure: LEFT ARM BASILIC ARTERIOVENOUS (AV) FISTULA CREATION FIRST STAGE;  Surgeon: Marty Heck, MD;  Location: [place];  Service: Vascular;  Laterality: Left;   BASCILIC VEIN TRANSPOSITION Left 05/01/2019   Procedure: LEFT SECOND STAGE [place].;  Surgeon: Marty Heck, MD;  Location: [place];  Service: Vascular;  Laterality: Left;   CYSTOSCOPY/URETEROSCOPY/HOLMIUM LASER/STENT PLACEMENT Right 07/17/2018   Procedure: CYSTOSCOPY/RETROGRADE/URETEROSCOPY/HOLMIUM LASER/STENT PLACEMENT/ CYSTOLITHALOPAXY;  Surgeon: Ellison Hughs  Marjory Lies, MD;  Location: [facility];  Service: Urology;  Laterality: Right;   GASTRIC BYPASS  01/2012   KNEE ARTHROSCOPY W/ MENISCAL REPAIR Right 05/2008   LAPAROSCOPIC INGUINAL HERNIA REPAIR Left 03-24-2015  @ [facility]   MITRAL VALVE REPAIR Right 06/29/2016   Procedure: MINIMALLY INVASIVE MITRAL VALVE REPAIR  (MVR);  Surgeon: [name], MD;  Location: [place];  Service: Open Heart Surgery;  Laterality:  Right;   PERITONEAL CATHETER INSERTION  08-18-2019  @[facility]    AND OPEN RIGHT INGUINAL HERNIA REPAIR   PERITONEAL CATHETER REMOVAL  03-31-2020  @[facility]    RIGHT/LEFT HEART CATH AND CORONARY ANGIOGRAPHY N/A 06/07/2016   Procedure: Right/Left Heart Cath and Coronary Angiography;  Surgeon: Larey Dresser, MD;  Location: [place] [facility];  Service: Cardiovascular;  Laterality: N/A;   TEE WITHOUT CARDIOVERSION N/A 05/04/2016   Procedure: TRANSESOPHAGEAL ECHOCARDIOGRAM (TEE);  Surgeon: Larey Dresser, MD;  Location: [facility];  Service: Cardiovascular;  Laterality: N/A;   TEE WITHOUT CARDIOVERSION N/A 06/29/2016   Procedure: TRANSESOPHAGEAL ECHOCARDIOGRAM (TEE);  Surgeon: [name], MD;  Location: [place];  Service: Open Heart Surgery;  Laterality: N/A;   TEE WITHOUT CARDIOVERSION N/A 03/06/2019   Procedure: TRANSESOPHAGEAL ECHOCARDIOGRAM (TEE);  Surgeon: Larey Dresser, MD;  Location: [facility] ENDOSCOPY;  Service: Cardiovascular;  Laterality: N/A;   TRANSURETHRAL RESECTION OF PROSTATE N/A 05/25/2020   Procedure: TRANSURETHRAL RESECTION OF THE PROSTATE (TURP) WITH CYSTOLITHOLAPAXY;  Surgeon: Ceasar Mons, MD;  Location: [facility];  Service: Urology;  Laterality: N/A;   UMBILICAL HERNIA REPAIR  09-05-2012  @[facility]     Allergies: Isosorbide  Medications: Prior to Admission medications   Medication Sig Start Date End Date Taking? Authorizing Provider  Albuterol Sulfate (PROAIR RESPICLICK) 474 (90 Base) MCG/ACT AEPB Inhale 2 puffs into the lungs every 6 (six) hours as needed (wheezing/shortness of breath).   Yes [provider]  atorvastatin (LIPITOR) 20 MG tablet Take 20 mg by mouth at bedtime. 04/09/20  Yes [provider]  Budeson-Glycopyrrol-Formoterol (BREZTRI AEROSPHERE) 160-9-4.8 MCG/ACT AERO Inhale 2 puffs into the lungs 2 (two) times daily as needed (congestion/respiratory issues.).   Yes [provider]  calcium acetate (PHOSLO) 667 MG capsule Take 2,668 mg by  mouth 3 (three) times daily with meals. 11/22/19  Yes [provider]  carvedilol (COREG) 12.5 MG tablet Take 12.5 mg by mouth daily at 6 (six) AM.   Yes [provider]  fexofenadine (ALLEGRA) 180 MG tablet Take 180 mg by mouth in the morning.   Yes [provider]  fluocinonide ointment (LIDEX) 2.59 % Apply 1 application topically 2 (two) times daily as needed (eczema). (Hands) 04/03/20  Yes [provider]  fluticasone (FLONASE) 50 MCG/ACT nasal spray Place 1 spray into both nostrils daily as needed for allergies or rhinitis.    Yes [provider]  pimecrolimus (ELIDEL) 1 % cream Apply topically 2 (two) times daily. 02/03/20  Yes Valentina Shaggy, MD  sildenafil (REVATIO) 20 MG tablet Take 20 mg by mouth daily as needed (erectile dysfunction). 12/30/18  Yes [provider]  tacrolimus (PROTOPIC) 0.1 % ointment Apply 1 application topically 2 (two) times daily as needed (eczema). (eyelids/hands) 04/30/20  Yes [provider]  tamsulosin (FLOMAX) 0.4 MG CAPS capsule Take 0.4 mg by mouth in the morning.   Yes [provider]  VYVANSE 30 MG capsule Take 30 mg by mouth every morning. 06/21/19  Yes [provider]  calcitRIOL (ROCALTROL) 0.5 MCG capsule Take 0.5 mcg  by mouth every Monday, Wednesday, and Friday. 04/28/20   [provider]  phenazopyridine (PYRIDIUM) 200 MG tablet Take 1 tablet (200 mg total) by mouth 3 (three) times daily as needed (for pain with urination). 05/26/20 05/26/21  Ceasar Mons, MD     Family History  Problem Relation Age of Onset   Diabetes Mother    Hypertension Mother    Heart failure Mother    Colon cancer Neg Hx    Esophageal cancer Neg Hx    Rectal cancer Neg Hx    Stomach cancer Neg Hx     Social History   Socioeconomic History   Marital status: Married    Spouse name: Not on file   Number of children: Not on file   Years of education: Not on file    Highest education level: Not on file  Occupational History   Not on file  Tobacco Use   Smoking status: Never   Smokeless tobacco: Never  Vaping Use   Vaping Use: Never used  Substance and Sexual Activity   Alcohol use: Not Currently   Drug use: Never   Sexual activity: Not on file  Other Topics Concern   Not on file  Social History Narrative   Not on file   Social Determinants of Health   Financial Resource Strain: Not on file  Food Insecurity: Not on file  Transportation Needs: Not on file  Physical Activity: Not on file  Stress: Not on file  Social Connections: Not on file      Review of Systems see above; currently denies fever, headache, chest pain, cough, nausea, vomiting; he does have dyspnea with exertion  Vital Signs: BP 106/88   Pulse 72   Temp 97.6 F (36.4 C) (Oral)   Resp 16   SpO2 96%   Physical Exam awake, alert.  Chest with few bibasilar crackles, slightly diminished breath sounds bases.  Heart with regular rate and rhythm, abdomen distended, some mild diffuse tenderness to palpation.  Right scrotal edema; lower extremity edema.  Left arm AV fistula with positive thrill/bruit.  Imaging: No results found.  Labs:  CBC: Recent Labs    01/22/20 2109 05/25/20 1320 05/26/20 0451 06/24/20 1247 07/09/20 1016  WBC 8.0 4.5  --  4.6 5.6  HGB 11.0* 12.0* 8.7* 9.7* 12.3*  HCT 33.2* 39.4 28.6* 32.5* 40.5  PLT 224 201  --  271 253    COAGS: Recent Labs    05/25/20 1612  INR 1.0    BMP: Recent Labs    05/25/20 1320 05/26/20 0451 06/24/20 1247 07/09/20 1016  NA 141 140 141 140  K 3.5 4.2 3.3* 4.1  CL 96* 99 97* 97*  CO2 34* 31 35* 32  GLUCOSE 83 123* 76 87  BUN 35* 43* 19 34*  CALCIUM 8.9 8.1* 8.9 8.9  CREATININE 9.96* 11.00* 7.87* 9.79*  GFRNONAA 6* 5* 8* 6*    LIVER FUNCTION TESTS: Recent Labs    01/22/20 2109 06/24/20 1247  BILITOT 0.6 0.7  AST 36 21  ALT 52* 16  ALKPHOS 39 44  PROT 5.6* 5.8*  ALBUMIN 3.1* 3.1*     TUMOR MARKERS: No results for input(s): AFPTM, CEA, CA199, CHROMGRNA in the last 8760 hours.  Assessment and Plan: 55 y.o. male with multiple medical problems including ADHD, anemia, asthma, gout, heart failure, end-stage renal disease with left arm AV fistula and prior peritoneal dialysis, prior CVA, nephrolithiasis, sleep apnea, hypertension, nonischemic cardiomyopathy, mitral valve repair.  He  is status post TURP for BPH on 05/25/2020 and since that time he has had difficulty voiding/hematuria with recent unsuccessful Foley catheter placement by urology in office.  Bedside cystoscopy at urology office 7/7 revealed near complete anterior urethral stricture and guidewire was unable to be passed.  He is having increasing lower abdominal pain/distention as well as constipation along with right testicular swelling/orchitis . He presents today for CT-guided suprapubic catheter placement.  Details/risks of procedure, including but not limited to, internal bleeding, infection, injury to adjacent structures, need for prolonged drainage discussed with patient with his understanding and consent.   Thank you for this interesting consult.  I greatly enjoyed meeting Vernon Blackburn and look forward to participating in their care.  A copy of this report was sent to the requesting provider on this date.  Electronically Signed: D. Rowe Robert, PA-C 07/09/2020, 11:24 AM   I spent a total of   25 minutes  in face to face in clinical consultation, greater than 50% of which was counseling/coordinating care for CT-guided suprapubic catheter placement

## 2020-07-09 NOTE — Procedures (Signed)
  Procedure: CT suprapubic bladder catheter placement 43f EBL:   minimal Complications:  none immediate  See full dictation in [facility].  Dillard Cannon MD Main # [phone] Pager  [phone]

## 2020-07-10 DIAGNOSIS — D631 Anemia in chronic kidney disease: Secondary | ICD-10-CM | POA: Diagnosis not present

## 2020-07-10 DIAGNOSIS — D689 Coagulation defect, unspecified: Secondary | ICD-10-CM | POA: Diagnosis not present

## 2020-07-10 DIAGNOSIS — N186 End stage renal disease: Secondary | ICD-10-CM | POA: Diagnosis not present

## 2020-07-10 DIAGNOSIS — N2581 Secondary hyperparathyroidism of renal origin: Secondary | ICD-10-CM | POA: Diagnosis not present

## 2020-07-10 DIAGNOSIS — Z992 Dependence on renal dialysis: Secondary | ICD-10-CM | POA: Diagnosis not present

## 2020-07-10 DIAGNOSIS — R110 Nausea: Secondary | ICD-10-CM | POA: Diagnosis not present

## 2020-07-10 DIAGNOSIS — D509 Iron deficiency anemia, unspecified: Secondary | ICD-10-CM | POA: Diagnosis not present

## 2020-07-12 ENCOUNTER — Emergency Department (HOSPITAL_COMMUNITY): Payer: Medicare Other

## 2020-07-12 ENCOUNTER — Encounter (HOSPITAL_COMMUNITY): Payer: Self-pay | Admitting: *Deleted

## 2020-07-12 ENCOUNTER — Other Ambulatory Visit: Payer: Self-pay

## 2020-07-12 ENCOUNTER — Inpatient Hospital Stay (HOSPITAL_COMMUNITY)
Admission: EM | Admit: 2020-07-12 | Discharge: 2020-07-15 | DRG: 947 | Disposition: A | Discharge status: Home / Self Care | Payer: Medicare Other | Attending: Internal Medicine | Admitting: Internal Medicine

## 2020-07-12 DIAGNOSIS — Z888 Allergy status to other drugs, medicaments and biological substances status: Secondary | ICD-10-CM

## 2020-07-12 DIAGNOSIS — M47817 Spondylosis without myelopathy or radiculopathy, lumbosacral region: Secondary | ICD-10-CM | POA: Diagnosis not present

## 2020-07-12 DIAGNOSIS — I10 Essential (primary) hypertension: Secondary | ICD-10-CM | POA: Diagnosis not present

## 2020-07-12 DIAGNOSIS — N2581 Secondary hyperparathyroidism of renal origin: Secondary | ICD-10-CM | POA: Diagnosis present

## 2020-07-12 DIAGNOSIS — D631 Anemia in chronic kidney disease: Secondary | ICD-10-CM | POA: Diagnosis present

## 2020-07-12 DIAGNOSIS — Z9889 Other specified postprocedural states: Secondary | ICD-10-CM | POA: Diagnosis not present

## 2020-07-12 DIAGNOSIS — I428 Other cardiomyopathies: Secondary | ICD-10-CM | POA: Diagnosis not present

## 2020-07-12 DIAGNOSIS — R110 Nausea: Secondary | ICD-10-CM | POA: Diagnosis not present

## 2020-07-12 DIAGNOSIS — M1A9XX0 Chronic gout, unspecified, without tophus (tophi): Secondary | ICD-10-CM | POA: Diagnosis present

## 2020-07-12 DIAGNOSIS — I5022 Chronic systolic (congestive) heart failure: Secondary | ICD-10-CM

## 2020-07-12 DIAGNOSIS — Z9111 Patient's noncompliance with dietary regimen: Secondary | ICD-10-CM | POA: Diagnosis not present

## 2020-07-12 DIAGNOSIS — Z8673 Personal history of transient ischemic attack (TIA), and cerebral infarction without residual deficits: Secondary | ICD-10-CM

## 2020-07-12 DIAGNOSIS — J449 Chronic obstructive pulmonary disease, unspecified: Secondary | ICD-10-CM | POA: Diagnosis present

## 2020-07-12 DIAGNOSIS — Z833 Family history of diabetes mellitus: Secondary | ICD-10-CM

## 2020-07-12 DIAGNOSIS — R338 Other retention of urine: Secondary | ICD-10-CM | POA: Diagnosis not present

## 2020-07-12 DIAGNOSIS — N186 End stage renal disease: Secondary | ICD-10-CM | POA: Diagnosis present

## 2020-07-12 DIAGNOSIS — Z9079 Acquired absence of other genital organ(s): Secondary | ICD-10-CM

## 2020-07-12 DIAGNOSIS — Z992 Dependence on renal dialysis: Secondary | ICD-10-CM | POA: Diagnosis not present

## 2020-07-12 DIAGNOSIS — D689 Coagulation defect, unspecified: Secondary | ICD-10-CM | POA: Diagnosis not present

## 2020-07-12 DIAGNOSIS — Z9884 Bariatric surgery status: Secondary | ICD-10-CM

## 2020-07-12 DIAGNOSIS — R1030 Lower abdominal pain, unspecified: Secondary | ICD-10-CM | POA: Diagnosis present

## 2020-07-12 DIAGNOSIS — I132 Hypertensive heart and chronic kidney disease with heart failure and with stage 5 chronic kidney disease, or end stage renal disease: Secondary | ICD-10-CM | POA: Diagnosis not present

## 2020-07-12 DIAGNOSIS — I5042 Chronic combined systolic (congestive) and diastolic (congestive) heart failure: Secondary | ICD-10-CM | POA: Diagnosis present

## 2020-07-12 DIAGNOSIS — F909 Attention-deficit hyperactivity disorder, unspecified type: Secondary | ICD-10-CM | POA: Diagnosis present

## 2020-07-12 DIAGNOSIS — N452 Orchitis: Secondary | ICD-10-CM | POA: Diagnosis present

## 2020-07-12 DIAGNOSIS — Z8249 Family history of ischemic heart disease and other diseases of the circulatory system: Secondary | ICD-10-CM | POA: Diagnosis not present

## 2020-07-12 DIAGNOSIS — R188 Other ascites: Principal | ICD-10-CM | POA: Diagnosis present

## 2020-07-12 DIAGNOSIS — R000 Tachycardia, unspecified: Secondary | ICD-10-CM | POA: Diagnosis present

## 2020-07-12 DIAGNOSIS — J45909 Unspecified asthma, uncomplicated: Secondary | ICD-10-CM

## 2020-07-12 DIAGNOSIS — D649 Anemia, unspecified: Secondary | ICD-10-CM | POA: Diagnosis not present

## 2020-07-12 DIAGNOSIS — R140 Abdominal distension (gaseous): Secondary | ICD-10-CM | POA: Diagnosis not present

## 2020-07-12 DIAGNOSIS — N401 Enlarged prostate with lower urinary tract symptoms: Secondary | ICD-10-CM | POA: Diagnosis present

## 2020-07-12 DIAGNOSIS — N3289 Other specified disorders of bladder: Secondary | ICD-10-CM | POA: Diagnosis not present

## 2020-07-12 DIAGNOSIS — Z20822 Contact with and (suspected) exposure to covid-19: Secondary | ICD-10-CM | POA: Diagnosis not present

## 2020-07-12 DIAGNOSIS — K5900 Constipation, unspecified: Secondary | ICD-10-CM | POA: Diagnosis present

## 2020-07-12 DIAGNOSIS — D509 Iron deficiency anemia, unspecified: Secondary | ICD-10-CM | POA: Diagnosis not present

## 2020-07-12 DIAGNOSIS — Z87442 Personal history of urinary calculi: Secondary | ICD-10-CM

## 2020-07-12 DIAGNOSIS — Z79899 Other long term (current) drug therapy: Secondary | ICD-10-CM

## 2020-07-12 DIAGNOSIS — G4733 Obstructive sleep apnea (adult) (pediatric): Secondary | ICD-10-CM | POA: Diagnosis present

## 2020-07-12 DIAGNOSIS — F419 Anxiety disorder, unspecified: Secondary | ICD-10-CM | POA: Diagnosis present

## 2020-07-12 DIAGNOSIS — I5021 Acute systolic (congestive) heart failure: Secondary | ICD-10-CM | POA: Diagnosis not present

## 2020-07-12 DIAGNOSIS — Z28310 Unvaccinated for covid-19: Secondary | ICD-10-CM

## 2020-07-12 DIAGNOSIS — R339 Retention of urine, unspecified: Secondary | ICD-10-CM | POA: Diagnosis not present

## 2020-07-12 DIAGNOSIS — Z7682 Awaiting organ transplant status: Secondary | ICD-10-CM

## 2020-07-12 LAB — CBC WITH DIFFERENTIAL/PLATELET
Abs Immature Granulocytes: 0 10*3/uL (ref 0.00–0.07)
Basophils Absolute: 0 10*3/uL (ref 0.0–0.1)
Basophils Relative: 1 %
Eosinophils Absolute: 0.3 10*3/uL (ref 0.0–0.5)
Eosinophils Relative: 8 %
HCT: 39.9 % (ref 39.0–52.0)
Hemoglobin: 12.3 g/dL — ABNORMAL LOW (ref 13.0–17.0)
Immature Granulocytes: 0 %
Lymphocytes Relative: 23 %
Lymphs Abs: 1 10*3/uL (ref 0.7–4.0)
MCH: 27.9 pg (ref 26.0–34.0)
MCHC: 30.8 g/dL (ref 30.0–36.0)
MCV: 90.5 fL (ref 80.0–100.0)
Monocytes Absolute: 0.5 10*3/uL (ref 0.1–1.0)
Monocytes Relative: 11 %
Neutro Abs: 2.5 10*3/uL (ref 1.7–7.7)
Neutrophils Relative %: 57 %
Platelets: 214 10*3/uL (ref 150–400)
RBC: 4.41 MIL/uL (ref 4.22–5.81)
RDW: 16.1 % — ABNORMAL HIGH (ref 11.5–15.5)
WBC: 4.3 10*3/uL (ref 4.0–10.5)
nRBC: 0 % (ref 0.0–0.2)

## 2020-07-12 LAB — COMPREHENSIVE METABOLIC PANEL
ALT: 16 U/L (ref 0–44)
AST: 27 U/L (ref 15–41)
Albumin: 3.5 g/dL (ref 3.5–5.0)
Alkaline Phosphatase: 46 U/L (ref 38–126)
Anion gap: 12 (ref 5–15)
BUN: 16 mg/dL (ref 6–20)
CO2: 33 mmol/L — ABNORMAL HIGH (ref 22–32)
Calcium: 8.4 mg/dL — ABNORMAL LOW (ref 8.9–10.3)
Chloride: 95 mmol/L — ABNORMAL LOW (ref 98–111)
Creatinine, Ser: 7.13 mg/dL — ABNORMAL HIGH (ref 0.61–1.24)
GFR, Estimated: 8 mL/min — ABNORMAL LOW (ref >60)
Glucose, Bld: 82 mg/dL (ref 70–99)
Potassium: 3.7 mmol/L (ref 3.5–5.1)
Sodium: 140 mmol/L (ref 135–145)
Total Bilirubin: 0.5 mg/dL (ref 0.3–1.2)
Total Protein: 6.5 g/dL (ref 6.5–8.1)

## 2020-07-12 LAB — RESP PANEL BY RT-PCR (FLU A&B, COVID) ARPGX2
Influenza A by PCR: NEGATIVE
Influenza B by PCR: NEGATIVE
SARS Coronavirus 2 by RT PCR: NEGATIVE

## 2020-07-12 LAB — PROTIME-INR
INR: 1.1 (ref 0.8–1.2)
Prothrombin Time: 13.7 seconds (ref 11.4–15.2)

## 2020-07-12 LAB — BRAIN NATRIURETIC PEPTIDE: B Natriuretic Peptide: 18.5 pg/mL (ref 0.0–100.0)

## 2020-07-12 MED ORDER — OXYCODONE-ACETAMINOPHEN 5-325 MG PO TABS
1.0000 | ORAL_TABLET | Freq: Once | ORAL | Status: AC
  Administered 2020-07-12: 1 via ORAL
  Filled 2020-07-12: qty 1

## 2020-07-12 NOTE — ED Notes (Signed)
Patient transported to CT 

## 2020-07-12 NOTE — ED Triage Notes (Signed)
Pt had suprapubic catheter placed 3 days ago and reports minimal drainage since. Called urologist and was told to come to ED for eval. Pt is on dialysis.

## 2020-07-12 NOTE — ED Provider Notes (Signed)
[place] DEPT Provider Note   CSN: [id number] Arrival date & time: 07/12/20  1625     History Chief Complaint  Patient presents with   catheter not draining    Vernon Blackburn is a 55 y.o. male with history of ESRD on MWF dialysis (left arm fistula), urethral stricture, BPH, who had a suprapubic Foley catheter placed to interventional radiology 3 days ago on July 8, presenting to ED with concern for obstructed catheter.  He reports this been going about 12 hours.  No output.  It has been scantly bloody since his procedure.  He is not on blood thinners otherwise.  He reports abdominal pressure and discomfort, "swelling" of abdomen in past two days, and feeling short of breath with this.  Denies fevers, chills.  He did attend dialysis as scheduled today.  Normally he produces "some urine, not a lot but some every day."  HPI     Past Medical History:  Diagnosis Date   ADHD    Anemia, chronic renal failure    Asthma    followed by pcp   Benign localized prostatic hyperplasia with lower urinary tract symptoms (LUTS)    Bladder stones    BPH (benign prostatic hyperplasia)    Chronic gout    05-06-2020  per pt last episode 6 months ago,  (involves right knee and right great toe)   Chronic systolic HF (heart failure) ([place])    followed by cardiology, dr Aundra Dubin @ CHF clinic,   last TEE 03-06-2019 in epic ef 45%   Dyspnea    ESRD on hemodialysis [facility])    primary nephrologist--- dr Moshe Cipro--- HD on M/W/ F  at [name] in [place] murmur    History of bladder stone    History of cellulitis 01/27/2020   ED visit ,  abdominal wall   History of CVA (cerebrovascular accident) without residual deficits    remote infarct per imaging MRI brain in care every where 02-07-2019   History of kidney stones    History of obstructive sleep apnea    05-06-2020  per pt test positive for osa prior to gastric bypass in 2014 used  cpap,  pt stated was retested after alot of weight loss , test was negative    Hypertension    followed by cardiology   NICM (nonischemic cardiomyopathy) ([place])    followed by cardiology---- 01/ 2018  ef 20-35%, severe MV stenosis;  last TEE echo in epic 03-06-2019  ef 45%   Peripheral edema    Bilateral lower extremity    S/P minimally invasive mitral valve repair followed by cardiology   06-29-2016  for severe stenosis ;  30 mm [name] via right mini thoracotomy approach;  last TEE echo in epic 03-06-2019 ef 45%, mild LVH, post op mild MR with mean gradient 42mmHg and no significant stenosis   Seasonal and perennial allergic rhinitis    Sleep apnea     Patient Active Problem List   Diagnosis Date Noted   Essential hypertension 07/13/2020   Asthma 07/13/2020   Ascites 07/12/2020   BPH with obstruction/lower urinary tract symptoms 05/25/2020   Abdominal wall cellulitis 01/27/2020   ESRD on dialysis [facility]) 04/22/2019   Long term (current) use of anticoagulants [Z79.01] 07/17/2016   Syncope and collapse 07/14/2016   Chest pain 07/13/2016   Other chest pain    Coagulopathy (HCC)    S/P minimally invasive mitral valve  repair 06/29/2016   CHF (congestive heart failure) ([place]) 06/06/2016   Hypertensive heart and chronic kidney disease with heart failure and stage 1 through stage 4 chronic kidney disease, or chronic kidney disease ([place]) 26/83/4196   Chronic systolic HF (heart failure) (HCC)    Cardiomyopathy (HCC)    Pulmonary HTN ([place])    DOE (dyspnea on exertion) 01/02/2016   Chronic kidney disease (CKD), stage IV (severe) (HCC)    Anemia    Edema 11/15/2011    Past Surgical History:  Procedure Laterality Date   AV FISTULA PLACEMENT Left 03/10/2019   Procedure: LEFT ARM BASILIC ARTERIOVENOUS (AV) FISTULA CREATION FIRST STAGE;  Surgeon: Marty Heck, MD;  Location: [place];  Service: Vascular;  Laterality: Left;   BASCILIC VEIN TRANSPOSITION Left 05/01/2019    Procedure: LEFT SECOND STAGE [place].;  Surgeon: Marty Heck, MD;  Location: [place];  Service: Vascular;  Laterality: Left;   CYSTOSCOPY/URETEROSCOPY/HOLMIUM LASER/STENT PLACEMENT Right 07/17/2018   Procedure: CYSTOSCOPY/RETROGRADE/URETEROSCOPY/HOLMIUM LASER/STENT PLACEMENT/ CYSTOLITHALOPAXY;  Surgeon: Ceasar Mons, MD;  Location: [facility];  Service: Urology;  Laterality: Right;   GASTRIC BYPASS  01/2012   KNEE ARTHROSCOPY W/ MENISCAL REPAIR Right 05/2008   LAPAROSCOPIC INGUINAL HERNIA REPAIR Left 03-24-2015  @ [facility]   MITRAL VALVE REPAIR Right 06/29/2016   Procedure: MINIMALLY INVASIVE MITRAL VALVE REPAIR  (MVR);  Surgeon: [name], MD;  Location: [place];  Service: Open Heart Surgery;  Laterality: Right;   PERITONEAL CATHETER INSERTION  08-18-2019  @[facility]    AND OPEN RIGHT INGUINAL HERNIA REPAIR   PERITONEAL CATHETER REMOVAL  03-31-2020  @[facility]    RIGHT/LEFT HEART CATH AND CORONARY ANGIOGRAPHY N/A 06/07/2016   Procedure: Right/Left Heart Cath and Coronary Angiography;  Surgeon: Larey Dresser, MD;  Location: [place] [facility];  Service: Cardiovascular;  Laterality: N/A;   TEE WITHOUT CARDIOVERSION N/A 05/04/2016   Procedure: TRANSESOPHAGEAL ECHOCARDIOGRAM (TEE);  Surgeon: Larey Dresser, MD;  Location: [facility] ENDOSCOPY;  Service: Cardiovascular;  Laterality: N/A;   TEE WITHOUT CARDIOVERSION N/A 06/29/2016   Procedure: TRANSESOPHAGEAL ECHOCARDIOGRAM (TEE);  Surgeon: [name], MD;  Location: [place];  Service: Open Heart Surgery;  Laterality: N/A;   TEE WITHOUT CARDIOVERSION N/A 03/06/2019   Procedure: TRANSESOPHAGEAL ECHOCARDIOGRAM (TEE);  Surgeon: Larey Dresser, MD;  Location: [facility] ENDOSCOPY;  Service: Cardiovascular;  Laterality: N/A;   TRANSURETHRAL RESECTION OF PROSTATE N/A 05/25/2020   Procedure: TRANSURETHRAL RESECTION OF THE PROSTATE (TURP) WITH CYSTOLITHOLAPAXY;  Surgeon: Ceasar Mons, MD;  Location[facility];  Service: Urology;   Laterality: N/A;   UMBILICAL HERNIA REPAIR  09-05-2012  @[facility]        Family History  Problem Relation Age of Onset   Diabetes Mother    Hypertension Mother    Heart failure Mother    Colon cancer Neg Hx    Esophageal cancer Neg Hx    Rectal cancer Neg Hx    Stomach cancer Neg Hx     Social History   Tobacco Use   Smoking status: Never   Smokeless tobacco: Never  Vaping Use   Vaping Use: Never used  Substance Use Topics   Alcohol use: Not Currently   Drug use: Never    Home Medications Prior to Admission medications   Medication Sig Start Date End Date Taking? Authorizing Provider  Albuterol Sulfate (PROAIR RESPICLICK) 222 (90 Base) MCG/ACT AEPB Inhale 2 puffs into the lungs every 6 (six) hours as needed (wheezing/shortness of breath).   Yes [provider]  atorvastatin (LIPITOR) 20 MG tablet Take 20 mg by mouth daily. 04/09/20  Yes [provider]  Budeson-Glycopyrrol-Formoterol (BREZTRI AEROSPHERE) 160-9-4.8 MCG/ACT AERO Inhale 2 puffs into the lungs 2 (two) times daily as needed (congestion/respiratory issues.).   Yes [provider]  calcitRIOL (ROCALTROL) 0.5 MCG capsule Take 0.5 mcg by mouth every Monday, Wednesday, and Friday. 04/28/20  Yes [provider]  calcium acetate (PHOSLO) 667 MG capsule Take 2,668 mg by mouth 3 (three) times daily with meals. 11/22/19  Yes [provider]  carvedilol (COREG) 12.5 MG tablet Take 12.5 mg by mouth daily at 6 (six) AM.   Yes [provider]  ciprofloxacin (CIPRO) 500 MG tablet Take 500 mg by mouth daily. Start date : 07/08/20 07/08/20  Yes [provider]  Febuxostat 80 MG TABS Take 80 mg by mouth daily.   Yes [provider]  fexofenadine (ALLEGRA) 180 MG tablet Take 180 mg by mouth in the morning.   Yes [provider]  fluocinonide ointment (LIDEX) 2.83 % Apply 1 application topically 2 (two) times daily as needed (eczema). (Hands) 04/03/20  Yes  [provider]  fluticasone (FLONASE) 50 MCG/ACT nasal spray Place 1 spray into both nostrils daily as needed for allergies or rhinitis.    Yes [provider]  furosemide (LASIX) 80 MG tablet Take 80 mg by mouth daily. 06/30/20  Yes [provider]  lactulose (CHRONULAC) 10 GM/15ML solution Take 10 g by mouth 2 (two) times daily as needed for mild constipation.   Yes [provider]  pimecrolimus (ELIDEL) 1 % cream Apply topically 2 (two) times daily. Patient taking differently: Apply 1 application topically 2 (two) times daily as needed (eczema). 02/03/20  Yes Valentina Shaggy, MD  polyethylene glycol (MIRALAX / GLYCOLAX) 17 g packet Take 17 g by mouth daily as needed.   Yes [provider]  sildenafil (REVATIO) 20 MG tablet Take 20 mg by mouth daily as needed (erectile dysfunction). 12/30/18  Yes [provider]  tacrolimus (PROTOPIC) 0.1 % ointment Apply 1 application topically 2 (two) times daily as needed (eczema). (eyelids/hands) 04/30/20  Yes [provider]  tamsulosin (FLOMAX) 0.4 MG CAPS capsule Take 0.4 mg by mouth in the morning.   Yes [provider]  VYVANSE 30 MG capsule Take 30 mg by mouth every morning. 06/21/19  Yes [provider]  phenazopyridine (PYRIDIUM) 200 MG tablet Take 1 tablet (200 mg total) by mouth 3 (three) times daily as needed (for pain with urination). Patient not taking: No sig reported 05/26/20 05/26/21  Ceasar Mons, MD    Allergies    Isosorbide  Review of Systems   Review of Systems  Constitutional:  Negative for chills and fever.  HENT:  Negative for ear pain and sore throat.   Eyes:  Negative for pain and visual disturbance.  Respiratory:  Negative for cough and shortness of breath.   Cardiovascular:  Negative for chest pain and palpitations.  Gastrointestinal:  Positive for abdominal pain and nausea. Negative for vomiting.  Genitourinary:  Negative for  scrotal swelling and testicular pain.  Musculoskeletal:  Negative for arthralgias and back pain.  Skin:  Negative for color change and rash.  All other systems reviewed and are negative.  Physical Exam Updated Vital Signs BP 108/86 (BP Location: Right Arm)   Pulse 76   Temp 98.6 F (37 C) (Oral)   Resp 17   Ht 5\' 10"  (1.778 m)   Wt 79.8 kg  SpO2 96%   BMI 25.24 kg/m   Physical Exam Constitutional:      General: He is not in acute distress. HENT:     Head: Normocephalic and atraumatic.  Eyes:     Conjunctiva/sclera: Conjunctivae normal.     Pupils: Pupils are equal, round, and reactive to light.  Cardiovascular:     Rate and Rhythm: Normal rate and regular rhythm.  Pulmonary:     Effort: Pulmonary effort is normal. No respiratory distress.  Abdominal:     General: There is distension.     Tenderness: There is no abdominal tenderness.     Comments: +fluid wave, no significant tenderness on abd exam  Musculoskeletal:     Comments: Fistula left arm, patent  Skin:    General: Skin is warm and dry.     Comments: Suprapubic catheter in place with no drainage, blood-tinged urine in foley bag, catheter flushes easily but does not provide further urine on drawback  Neurological:     General: No focal deficit present.     Mental Status: He is alert. Mental status is at baseline.  Psychiatric:        Mood and Affect: Mood normal.        Behavior: Behavior normal.    ED Results / Procedures / Treatments   Labs (all labs ordered are listed, but only abnormal results are displayed) Labs Reviewed  COMPREHENSIVE METABOLIC PANEL - Abnormal; Notable for the following components:      Result Value   Chloride 95 (*)    CO2 33 (*)    Creatinine, Ser 7.13 (*)    Calcium 8.4 (*)    GFR, Estimated 8 (*)    All other components within normal limits  CBC WITH DIFFERENTIAL/PLATELET - Abnormal; Notable for the following components:   Hemoglobin 12.3 (*)    RDW 16.1 (*)    All other  components within normal limits  CBC WITH DIFFERENTIAL/PLATELET - Abnormal; Notable for the following components:   RBC 4.15 (*)    Hemoglobin 11.5 (*)    HCT 36.7 (*)    RDW 15.9 (*)    All other components within normal limits  COMPREHENSIVE METABOLIC PANEL - Abnormal; Notable for the following components:   Potassium 3.1 (*)    Chloride 96 (*)    CO2 34 (*)    Creatinine, Ser 7.76 (*)    Calcium 8.3 (*)    Total Protein 5.5 (*)    Albumin 2.9 (*)    GFR, Estimated 8 (*)    All other components within normal limits  RESP PANEL BY RT-PCR (FLU A&B, COVID) ARPGX2  MRSA NEXT GEN BY PCR, NASAL  PROTIME-INR  BRAIN NATRIURETIC PEPTIDE  MAGNESIUM  PHOSPHORUS  TSH    EKG None  Radiology CT PELVIS WO CONTRAST  Result Date: 07/12/2020 CLINICAL DATA:  55 year old male with bladder mass. Suprapubic catheter placed 3 days ago with minimal drainage. Evaluate for location the pigtail catheter. EXAM: CT PELVIS WITHOUT CONTRAST TECHNIQUE: Multidetector CT imaging of the pelvis was performed following the standard protocol without intravenous contrast. COMPARISON:  CT abdomen pelvis dated 01/22/2020. FINDINGS: Evaluation of this exam is limited in the absence of intravenous contrast. Urinary Tract: The urinary bladder is decompressed around a suprapubic catheter. The pigtail tip of the suprapubic catheter is located within the bladder lumen. Bowel: No bowel dilatation or evidence of obstruction. The appendix is normal. Vascular/Lymphatic: Minimal atherosclerotic calcification of the iliac arteries. Reproductive: The prostate and seminal vesicles  are grossly unremarkable. Other: There is moderate ascites. As well as anasarca. There has been significant interval increase in the size of the status compared to the CT of 01/22/2020. Clinical correlation is recommended. Musculoskeletal: Degenerative changes primarily at L5-S1. No acute osseous pathology. IMPRESSION: 1. Suprapubic catheter with pigtail tip  located within the bladder lumen. The bladder is completely decompressed around the catheter. 2. Moderate ascites, significantly increased since 01/22/2020. Clinical correlation is recommended. These results were called by telephone at the time of interpretation on 07/12/2020 at 8:34 pm to provider Miley Lindon , who verbally acknowledged these results. Electronically Signed   By: Anner Crete M.D.   On: 07/12/2020 20:34    Procedures Procedures   Medications Ordered in ED Medications  albuterol (PROVENTIL) (2.5 MG/3ML) 0.083% nebulizer solution 2.5 mg (has no administration in time range)  atorvastatin (LIPITOR) tablet 20 mg (has no administration in time range)  calcitRIOL (ROCALTROL) capsule 0.5 mcg (has no administration in time range)  calcium acetate (PHOSLO) capsule 2,668 mg (2,668 mg Oral Not Given 07/13/20 0800)  polyethylene glycol (MIRALAX / GLYCOLAX) packet 17 g (has no administration in time range)  tamsulosin (FLOMAX) capsule 0.4 mg (has no administration in time range)  lisdexamfetamine (VYVANSE) capsule 30 mg (has no administration in time range)  acetaminophen (TYLENOL) tablet 650 mg (has no administration in time range)    Or  acetaminophen (TYLENOL) suppository 650 mg (has no administration in time range)  HYDROcodone-acetaminophen (NORCO/VICODIN) 5-325 MG per tablet 1-2 tablet (has no administration in time range)  sodium chloride flush (NS) 0.9 % injection 3 mL (3 mLs Intravenous Given 07/13/20 0408)  sodium chloride flush (NS) 0.9 % injection 3 mL (has no administration in time range)  0.9 %  sodium chloride infusion (has no administration in time range)  ciprofloxacin (CIPRO) tablet 500 mg (has no administration in time range)  Chlorhexidine Gluconate Cloth 2 % PADS 6 each (has no administration in time range)  oxyCODONE-acetaminophen (PERCOCET/ROXICET) 5-325 MG per tablet 1 tablet (1 tablet Oral Given 07/12/20 2032)  fentaNYL (SUBLIMAZE) injection 25 mcg (25 mcg  Intravenous Given 07/13/20 0406)    ED Course  I have reviewed the triage vital signs and the nursing notes.  Pertinent labs & imaging results that were available during my care of the patient were reviewed by me and considered in my medical decision making (see chart for details).  55 yo male here with abdominal distension, discomfort - reporting foley not putting out urine.  Initially he was evaluated with concern for possible foley obstruction and bladder distention.  Bladder scanner reporting significant fluid.  Patient had a stat CT pelvis which revealed bladder is decompressed and empty, with catheter in correct location, but it appears he has significant new ascites in the abdomen.  This is likely what was detected with the bladder scanner.  I reviewed his prior medical records as noted above No emergent indication for dialysis at this time.  K normal, no acute respiratory distress or volume overload.  Clinically I doubt SBP.  He has a distant hx of peritoneal dialysis but stopped a long time ago, and does not have a shunt or abdominal line  Clinical Course as of 07/13/20 0947  Mon Jul 12, 2020  2003 Attempted to flush foley but cannot aspirate any urine - I wonder if the pigtail is obstructed.  I spoke to Dr Earleen Newport from IR who advised a stat CT pelvis to confirm location of the catheter. [MT]  2206 Labs  do not show any evidence of significant low albumin or congestive heart failure exacerbation.  No clear explanation for the ascites noted on abdominal exam.  This point we will admit him to the hospital for monitoring of his breathing overnight and for therapeutic and possibly diagnostic paracentesis in the morning.  I discussed with Dr Earleen Newport who is in agreement - IR order can be placed and team consulted for AM by hospitalist service. [MT]    Clinical Course User Index [MT] Dilyn Smiles, Carola Rhine, MD    Final Clinical Impression(s) / ED Diagnoses Final diagnoses:  Other ascites     Rx / DC Orders ED Discharge Orders     None        Wyvonnia Dusky, MD 07/13/20 [phone]

## 2020-07-12 NOTE — H&P (Signed)
Vernon Blackburn VVO:[id number] DOB: [birth year] DOA: 07/12/2020     PCP: [name], MD   Outpatient Specialists: * NONE CARDS: Dr.[name], Elby Showers, MD NEphrology:  Regis Bill Chaney Malling, MD  at [facility]    Patient arrived to ER on 07/12/20 at 1625 Referred by Attending Trifan, Carola Rhine, MD   Patient coming from: home Lives  With family    Chief Complaint:   Chief Complaint  Patient presents with   catheter not draining    HPI: Vernon Blackburn is a 55 y.o. male with medical history significant of ESRD on M/W/Fon transplant list, HTN, CHF systolic  Uretheral stricture, asthma  Presented with    suprapubic catheter was placed 3 days ago and only have had a little bit of urine produced.  He called urology and was told to come to emergency department Was seen earlier for in JU[place] 8 for obstructed catheter Have not urinated for past 12h Still taking cipro to finish 7 day course after his suprapubic catheter was placed Reports abdominal  fullness Last HD was today     Has NOt been vaccinated against COVID     Initial COVID TEST  NEGATIVE   Lab Results  Component Value Date   [place] 07/12/2020   [place] NEGATIVE 05/21/2020   [place] NEGATIVE 05/07/2020   [place] NEGATIVE 01/22/2020     Regarding pertinent Chronic problems:   Hyperlipidemia -  on statins lipitor Lipid Panel     Component Value Date/Time   CHOL 118 06/24/2020 1247   TRIG 45 06/24/2020 1247   HDL 48 06/24/2020 1247   CHOLHDL 2.5 06/24/2020 1247   VLDL 9 06/24/2020 1247   [place] 61 06/24/2020 1247     HTN on coreg   chronic CHF diastolic/systolic/ combined - last echo 2021 EF 45%  Sp MItral valve repair in 2018    Asthma -well  controlled on home inhalers/ nebs      ESRD on HD M/W/F Estimated Creatinine Clearance: 12.2 mL/min (A) (by C-G formula based on SCr of 7.13 mg/dL (H)).  Lab Results  Component Value Date   CREATININE 7.13 (H) 07/12/2020    CREATININE 9.79 (H) 07/09/2020   CREATININE 7.87 (H) 06/24/2020      Chronic anemia - baseline hg Hemoglobin & Hematocrit  Recent Labs    06/24/20 1247 07/09/20 1016 07/12/20 2033  HGB 9.7* 12.3* 12.3*     While in ER: Imaging showing no bladder But large ascites IR is aware      ED Triage Vitals  Enc Vitals Group     BP 07/12/20 1641 (!) 114/96     Pulse Rate 07/12/20 1641 80     Resp 07/12/20 1641 16     Temp 07/12/20 1641 98.1 F (36.7 C)     Temp Source 07/12/20 1641 Oral     SpO2 07/12/20 1641 98 %     Weight 07/12/20 1641 178 lb 2 oz (80.8 kg)     Height 07/12/20 1641 5\' 10"  (1.778 m)     Head Circumference --      Peak Flow --      Pain Score 07/12/20 1647 9     Pain Loc --      Pain Edu? --      Excl. in [place]? --   TMAX(24)@     _________________________________________ Significant initial  Findings: Abnormal Labs Reviewed  COMPREHENSIVE METABOLIC PANEL - Abnormal; Notable for the following components:  Result Value   Chloride 95 (*)    CO2 33 (*)    Creatinine, Ser 7.13 (*)    Calcium 8.4 (*)    GFR, Estimated 8 (*)    All other components within normal limits  CBC WITH DIFFERENTIAL/PLATELET - Abnormal; Notable for the following components:   Hemoglobin 12.3 (*)    RDW 16.1 (*)    All other components within normal limits   ____________________________________________ Ordered    CTabd/pelvis - 1. Suprapubic catheter with pigtail tip located within the bladder lumen. The bladder is completely decompressed around the catheter. 2. Moderate ascites, significantly increased since 01/22/2020. Clinical correlation is recommended.      ECG: Ordered Personally reviewed by me showing: HR : 73 Rhythm:  NSR  Ischemic changes*nonspecific changes, no evidence of ischemic changes QTC 493     The recent clinical data is shown below. Vitals:   07/12/20 2230 07/13/20 0030 07/13/20 0130 07/13/20 0242  BP: 106/79 103/76 105/83 108/90  Pulse: 66 77  75 68  Resp: 17 16 18 20   Temp:    98.3 F (36.8 C)  TempSrc:    Oral  SpO2: 100% 99% 99% 95%  Weight:    79.8 kg  Height:    5\' 10"  (1.778 m)    WBC     Component Value Date/Time   WBC 4.3 07/12/2020 2033   LYMPHSABS 1.0 07/12/2020 2033   LYMPHSABS 0.9 05/01/2016 0738   MONOABS 0.5 07/12/2020 2033   EOSABS 0.3 07/12/2020 2033   EOSABS 0.1 05/01/2016 0738   BASOSABS 0.0 07/12/2020 2033   BASOSABS 0.0 05/01/2016 0738       UA  ordered     Results for orders placed or performed during the hospital encounter of 07/12/20  Resp Panel by RT-PCR (Flu A&B, Covid) Nasopharyngeal Swab     Status: None   Collection Time: 07/12/20 10:20 PM   Specimen: Nasopharyngeal Swab; Nasopharyngeal(NP) swabs in vial transport medium  Result Value Ref Range Status   SARS Coronavirus 2 by RT PCR NEGATIVE NEGATIVE Final         Influenza A by PCR NEGATIVE NEGATIVE Final   Influenza B by PCR NEGATIVE NEGATIVE Final           _______________________________________________________ ER Provider Called:   IR  Dr. Earleen Newport They Recommend admit to medicine  Will see in AM    _______________________________________________ Hospitalist was called for admission for  ascites  The following Work up has been ordered so far:  Orders Placed This Encounter  Procedures   Resp Panel by RT-PCR (Flu A&B, Covid) Nasopharyngeal Swab   CT PELVIS WO CONTRAST   Comprehensive metabolic panel   CBC with Differential   Protime-INR   Brain natriuretic peptide   Bladder scan   Consult to hospitalist   Insert peripheral IV    Following Medications were ordered in ER: Medications  oxyCODONE-acetaminophen (PERCOCET/ROXICET) 5-325 MG per tablet 1 tablet (1 tablet Oral Given 07/12/20 2032)        Consult Orders  (From admission, onward)           Start     Ordered   07/12/20 2207  Consult to hospitalist  Once       Provider:  (Not yet assigned)  Question Answer Comment  Place call to: [facility]    Reason for Consult Admit      07/12/20 2206             OTHER Significant initial  Findings:  labs showing:    Recent Labs  Lab 07/09/20 1016 07/12/20 2033  NA 140 140  K 4.1 3.7  CO2 32 33*  GLUCOSE 87 82  BUN 34* 16  CREATININE 9.79* 7.13*  CALCIUM 8.9 8.4*    Cr   Up from baseline see below Lab Results  Component Value Date   CREATININE 7.13 (H) 07/12/2020   CREATININE 9.79 (H) 07/09/2020   CREATININE 7.87 (H) 06/24/2020    Recent Labs  Lab 07/12/20 2033  AST 27  ALT 16  ALKPHOS 46  BILITOT 0.5  PROT 6.5  ALBUMIN 3.5   Lab Results  Component Value Date   CALCIUM 8.4 (L) 07/12/2020       Plt: Lab Results  Component Value Date   PLT 214 07/12/2020           Recent Labs  Lab 07/09/20 1016 07/12/20 2033  WBC 5.6 4.3  NEUTROABS 3.3 2.5  HGB 12.3* 12.3*  HCT 40.5 39.9  MCV 92.7 90.5  PLT 253 214    HG/HCT  stable,      Component Value Date/Time   HGB 12.3 (L) 07/12/2020 2033   HGB 10.4 (L) 05/01/2016 0738   HCT 39.9 07/12/2020 2033   HCT 32.8 (L) 05/01/2016 0738   MCV 90.5 07/12/2020 2033   MCV 78 (L) 05/01/2016 0738        BNP (last 3 results) Recent Labs    07/12/20 2034  BNP 18.5           Cultures:    Component Value Date/Time   SDES  02/14/2018 0946    URINE, CLEAN CATCH Performed at [facility], [place] [address] [place], [place] [address]    SPECREQUEST  02/14/2018 8937    NONE Performed at [facility], [place] [address] [place], [place] [address]    CULT  02/14/2018 [phone]    NO GROWTH Performed at [facility], [place] [address] [place],  [address]    REPTSTATUS 02/15/2018 FINAL 02/14/2018 0946     Radiological Exams on Admission: CT PELVIS WO CONTRAST  Result Date: 07/12/2020 CLINICAL DATA:  55 year old male with bladder mass. Suprapubic catheter placed 3 days ago with minimal drainage. Evaluate for location the pigtail catheter. EXAM: CT PELVIS  WITHOUT CONTRAST TECHNIQUE: Multidetector CT imaging of the pelvis was performed following the standard protocol without intravenous contrast. COMPARISON:  CT abdomen pelvis dated 01/22/2020. FINDINGS: Evaluation of this exam is limited in the absence of intravenous contrast. Urinary Tract: The urinary bladder is decompressed around a suprapubic catheter. The pigtail tip of the suprapubic catheter is located within the bladder lumen. Bowel: No bowel dilatation or evidence of obstruction. The appendix is normal. Vascular/Lymphatic: Minimal atherosclerotic calcification of the iliac arteries. Reproductive: The prostate and seminal vesicles are grossly unremarkable. Other: There is moderate ascites. As well as anasarca. There has been significant interval increase in the size of the status compared to the CT of 01/22/2020. Clinical correlation is recommended. Musculoskeletal: Degenerative changes primarily at L5-S1. No acute osseous pathology. IMPRESSION: 1. Suprapubic catheter with pigtail tip located within the bladder lumen. The bladder is completely decompressed around the catheter. 2. Moderate ascites, significantly increased since 01/22/2020. Clinical correlation is recommended. These results were called by telephone at the time of interpretation on 07/12/2020 at 8:34 pm to provider MATTHEW TRIFAN , who verbally acknowledged these results. Electronically Signed   By: Anner Crete M.D.   On: 07/12/2020 20:34  _______________________________________________________________________________________________________ Latest  Blood pressure 116/89, pulse 74, temperature 98.6 F (37 C), resp. rate 18, height 5\' 10"  (1.778 m), weight 80.8 kg, SpO2 100 %.   Review of Systems:    Pertinent positives include:  fatigue, abdominal distention  Constitutional:  No weight loss, night sweats, Fevers, chills,weight loss  HEENT:  No headaches, Difficulty swallowing,Tooth/dental problems,Sore throat,  No sneezing,  itching, ear ache, nasal congestion, post nasal drip,  Cardio-vascular:  No chest pain, Orthopnea, PND, anasarca, dizziness, palpitations.no Bilateral lower extremity swelling  GI:  No heartburn, indigestion, abdominal pain, nausea, vomiting, diarrhea, change in bowel habits, loss of appetite, melena, blood in stool, hematemesis Resp:  no shortness of breath at rest. No dyspnea on exertion, No excess mucus, no productive cough, No non-productive cough, No coughing up of blood.No change in color of mucus.No wheezing. Skin:  no rash or lesions. No jaundice GU:  no dysuria, change in color of urine, no urgency or frequency. No straining to urinate.  No flank pain.  Musculoskeletal:  No joint pain or no joint swelling. No decreased range of motion. No back pain.  Psych:  No change in mood or affect. No depression or anxiety. No memory loss.  Neuro: no localizing neurological complaints, no tingling, no weakness, no double vision, no gait abnormality, no slurred speech, no confusion  All systems reviewed and apart from [place] all are negative _______________________________________________________________________________________________ Past Medical History:   Past Medical History:  Diagnosis Date   ADHD    Anemia, chronic renal failure    Asthma    followed by pcp   Benign localized prostatic hyperplasia with lower urinary tract symptoms (LUTS)    Bladder stones    BPH (benign prostatic hyperplasia)    Chronic gout    05-06-2020  per pt last episode 6 months ago,  (involves right knee and right great toe)   Chronic systolic HF (heart failure) ([place])    followed by cardiology, dr Aundra Dubin @ [facility] clinic,   last TEE 03-06-2019 in epic ef 45%   Dyspnea    ESRD on hemodialysis [facility])    primary nephrologist--- dr Moshe Cipro--- HD on M/W/ F  at [place] [facility] in [place] murmur    History of bladder stone    History of cellulitis 01/27/2020   ED visit ,   abdominal wall   History of CVA (cerebrovascular accident) without residual deficits    remote infarct per imaging MRI brain in care every where 02-07-2019   History of kidney stones    History of obstructive sleep apnea    05-06-2020  per pt test positive for osa prior to gastric bypass in 2014 used cpap,  pt stated was retested after alot of weight loss , test was negative    Hypertension    followed by cardiology   NICM (nonischemic cardiomyopathy) ([place])    followed by cardiology---- 01/ 2018  ef 20-35%, severe MV stenosis;  last TEE echo in epic 03-06-2019  ef 45%   Peripheral edema    Bilateral lower extremity    S/P minimally invasive mitral valve repair followed by cardiology   06-29-2016  for severe stenosis ;  30 mm [name] via right mini thoracotomy approach;  last TEE echo in epic 03-06-2019 ef 45%, mild LVH, post op mild MR with mean gradient 36mmHg and no significant stenosis   Seasonal and perennial allergic rhinitis    Sleep apnea  Past Surgical History:  Procedure Laterality Date   AV FISTULA PLACEMENT Left 03/10/2019   Procedure: LEFT ARM BASILIC ARTERIOVENOUS (AV) FISTULA CREATION FIRST STAGE;  Surgeon: Marty Heck, MD;  Location: [place];  Service: Vascular;  Laterality: Left;   [place] Left 05/01/2019   Procedure: LEFT SECOND STAGE [place].;  Surgeon: Marty Heck, MD;  Location: [place];  Service: Vascular;  Laterality: Left;   CYSTOSCOPY/URETEROSCOPY/HOLMIUM LASER/STENT PLACEMENT Right 07/17/2018   Procedure: CYSTOSCOPY/RETROGRADE/URETEROSCOPY/HOLMIUM LASER/STENT PLACEMENT/ CYSTOLITHALOPAXY;  Surgeon: Ceasar Mons, MD;  Location: [facility];  Service: Urology;  Laterality: Right;   GASTRIC BYPASS  01/2012   KNEE ARTHROSCOPY W/ MENISCAL REPAIR Right 05/2008   LAPAROSCOPIC INGUINAL HERNIA REPAIR Left 03-24-2015  @ [facility]   MITRAL VALVE REPAIR Right 06/29/2016   Procedure: MINIMALLY  INVASIVE MITRAL VALVE REPAIR  (MVR);  Surgeon: [name], MD;  Location: [place];  Service: Open Heart Surgery;  Laterality: Right;   PERITONEAL CATHETER INSERTION  08-18-2019  @[facility]    AND OPEN RIGHT INGUINAL HERNIA REPAIR   PERITONEAL CATHETER REMOVAL  03-31-2020  @[facility]    RIGHT/LEFT HEART CATH AND CORONARY ANGIOGRAPHY N/A 06/07/2016   Procedure: Right/Left Heart Cath and Coronary Angiography;  Surgeon: Larey Dresser, MD;  Location: [place] [facility];  Service: Cardiovascular;  Laterality: N/A;   TEE WITHOUT CARDIOVERSION N/A 05/04/2016   Procedure: TRANSESOPHAGEAL ECHOCARDIOGRAM (TEE);  Surgeon: Larey Dresser, MD;  Location: [facility] ENDOSCOPY;  Service: Cardiovascular;  Laterality: N/A;   TEE WITHOUT CARDIOVERSION N/A 06/29/2016   Procedure: TRANSESOPHAGEAL ECHOCARDIOGRAM (TEE);  Surgeon: [name], MD;  Location: [place];  Service: Open Heart Surgery;  Laterality: N/A;   TEE WITHOUT CARDIOVERSION N/A 03/06/2019   Procedure: TRANSESOPHAGEAL ECHOCARDIOGRAM (TEE);  Surgeon: Larey Dresser, MD;  Location: [facility] ENDOSCOPY;  Service: Cardiovascular;  Laterality: N/A;   TRANSURETHRAL RESECTION OF PROSTATE N/A 05/25/2020   Procedure: TRANSURETHRAL RESECTION OF THE PROSTATE (TURP) WITH CYSTOLITHOLAPAXY;  Surgeon: Ceasar Mons, MD;  Location[facility];  Service: Urology;  Laterality: N/A;   UMBILICAL HERNIA REPAIR  09-05-2012  @[facility]     Social History:  Ambulatory  independently       reports that he has never smoked. He has never used smokeless tobacco. He reports previous alcohol use. He reports that he does not use drugs.      Family History:   Family History  Problem Relation Age of Onset   Diabetes Mother    Hypertension Mother    Heart failure Mother    Colon cancer Neg Hx    Esophageal cancer Neg Hx    Rectal cancer Neg Hx    Stomach cancer Neg Hx    ______________________________________________________________________________________________ Allergies: Allergies   Allergen Reactions   Isosorbide     Hypotension      Prior to Admission medications   Medication Sig Start Date End Date Taking? Authorizing Provider  Albuterol Sulfate (PROAIR RESPICLICK) 161 (90 Base) MCG/ACT AEPB Inhale 2 puffs into the lungs every 6 (six) hours as needed (wheezing/shortness of breath).    [provider]  atorvastatin (LIPITOR) 20 MG tablet Take 20 mg by mouth at bedtime. 04/09/20   [provider]  Budeson-Glycopyrrol-Formoterol (BREZTRI AEROSPHERE) 160-9-4.8 MCG/ACT AERO Inhale 2 puffs into the lungs 2 (two) times daily as needed (congestion/respiratory issues.).    [provider]  calcitRIOL (ROCALTROL) 0.5 MCG capsule Take 0.5 mcg by mouth every Monday, Wednesday, and Friday. 04/28/20   [provider]  calcium acetate (PHOSLO) 667 MG capsule Take 2,668 mg by mouth 3 (three) times daily with meals. 11/22/19   [provider]  carvedilol (COREG) 12.5 MG tablet Take 12.5 mg by mouth daily at 6 (six) AM.    [provider]  fexofenadine (ALLEGRA) 180 MG tablet Take 180 mg by mouth in the morning.    [provider]  fluocinonide ointment (LIDEX) 2.54 % Apply 1 application topically 2 (two) times daily as needed (eczema). (Hands) 04/03/20   [provider]  fluticasone (FLONASE) 50 MCG/ACT nasal spray Place 1 spray into both nostrils daily as needed for allergies or rhinitis.     [provider]  phenazopyridine (PYRIDIUM) 200 MG tablet Take 1 tablet (200 mg total) by mouth 3 (three) times daily as needed (for pain with urination). 05/26/20 05/26/21  Ceasar Mons, MD  pimecrolimus (ELIDEL) 1 % cream Apply topically 2 (two) times daily. 02/03/20   Valentina Shaggy, MD  sildenafil (REVATIO) 20 MG tablet Take 20 mg by mouth daily as needed (erectile dysfunction). 12/30/18   [provider]  tacrolimus (PROTOPIC) 0.1 % ointment Apply 1 application topically 2 (two) times daily as  needed (eczema). (eyelids/hands) 04/30/20   [provider]  tamsulosin (FLOMAX) 0.4 MG CAPS capsule Take 0.4 mg by mouth in the morning.    [provider]  VYVANSE 30 MG capsule Take 30 mg by mouth every morning. 06/21/19   [provider]    ___________________________________________________________________________________________________ Physical Exam: Vitals with BMI 07/12/2020 07/12/2020 07/12/2020  Height - - -  Weight - - -  BMI - - -  Systolic 270 623 762  Diastolic 89 831 98  Pulse 74 78 78      1. General:  in No  Acute distress    Chronically ill  -appearing 2. Psychological: Alert and  Oriented 3. Head/ENT:   Moist  Mucous Membranes                          Head Non traumatic, neck supple                      Poor Dentition 4. SKIN: normal  Skin turgor,  Skin clean Dry and intact no rash 5. Heart: Regular rate and rhythm no Murmur, no Rub or gallop 6. Lungs:  , no wheezes or crackles   7. Abdomen: Soft,  non-tender,  distended bowel sounds present distant 8. Lower extremities: no clubbing, cyanosis, no  edema 9. Neurologically Grossly intact, moving all 4 extremities equally  10. MSK: Normal range of motion    Chart has been reviewed  ______________________________________________________________________________________________  Assessment/Plan  55 y.o. male with medical history significant of ESRD on M/W/Fon transplant list, HTN, CHF systolic  Uretheral stricture, asthma    Admitted for ascites New onset unclear etiology  Present on Admission:  Ascites -unclear etiology obtain paracentesis IR consulted. Also obtain echogram Continue Cipro as patient has been taking it for recent suprapubic urostomy placement   Anemia -chronic stable continue home medications   Chronic systolic HF (heart failure) ([place]) stable managed by hemodialysis.  Hold Lasix given soft blood pressures   Essential hypertension -hold home medications given soft  blood pressure   Asthma -chronic stable continue home medication ESRD -discussed with nephrology we will put on the list was hemodialysis was on Monday, 11 July  History of urethral stricture status post suprapubic catheter no evidence of bladder  obstruction at this time Other plan as per orders.  DVT prophylaxis:  SCD      Code Status:    Code Status: Prior FULL CODE  as per patient   I had personally discussed CODE STATUS with patient    Family Communication:   Family not at  Bedside    Disposition Plan:      To home once workup is complete and patient is stable   Following barriers for discharge:                            Electrolytes corrected                                                         Will need to be able to tolerate PO                                                      Will need consultants to evaluate patient prior to discharge                       Would benefit from PT/OT eval prior to DC  Ordered                                      Consults called:   nephrology is aware, iR is awre  Admission status:  ED Disposition     ED Disposition  [place]: [place] [[id number]]  Level of Care: Telemetry Medical [104]  May admit patient to [facility] or [facility] if equivalent level of care is available:: No  Covid Evaluation: Asymptomatic Screening Protocol (No Symptoms)  Diagnosis: Ascites [544920]  Admitting Physician: Toy Baker [[id number]]  Attending Physician: Toy Baker [[id number]]  Estimated length of stay: past midnight tomorrow  Certification:: I certify this patient will need inpatient services for at least 2 midnights             inpatient     I Expect 2 midnight stay secondary to severity of patient's current illness need for inpatient interventions justified by the following:    Severe lab/radiological/exam abnormalities including:    Ascites  and extensive  comorbidities including:    COPD/asthma   ESRD on HD    That are currently affecting medical management.   I expect  patient to be hospitalized for 2 midnights requiring inpatient medical care.  Patient is at high risk for adverse outcome (such as loss of life or disability) if not treated.  Indication for inpatient stay as follows:    Need for operative/procedural  intervention New or worsening hypoxia  Need for procedure    Level of care    tele  For 12H      Lab Results  Component Value Date   [place] NEGATIVE 07/12/2020     Precautions: admitted as   Covid Negative      PPE: Used by the provider:   [id number]  eye Carroll Kinds 07/13/2020, 3:19 AM    [facility]     after 2 AM please page floor coverage PA If 7AM-7PM, please contact the day team taking care of the patient using [email or web address]   Patient was evaluated in the context of the global COVID-19 pandemic, which necessitated consideration that the patient might be at risk for infection with the SARS-CoV-2 virus that causes COVID-19. Institutional protocols and algorithms that pertain to the evaluation of patients at risk for COVID-19 are in a state of rapid change based on information released by regulatory bodies including the CDC and federal and state organizations. These policies and algorithms were followed during the patient's care.

## 2020-07-13 ENCOUNTER — Inpatient Hospital Stay (HOSPITAL_COMMUNITY): Payer: Medicare Other

## 2020-07-13 ENCOUNTER — Ambulatory Visit (HOSPITAL_COMMUNITY): Admission: RE | Admit: 2020-07-13 | Payer: Medicare Other | Source: Ambulatory Visit

## 2020-07-13 DIAGNOSIS — I5021 Acute systolic (congestive) heart failure: Secondary | ICD-10-CM | POA: Diagnosis not present

## 2020-07-13 DIAGNOSIS — J45909 Unspecified asthma, uncomplicated: Secondary | ICD-10-CM | POA: Diagnosis present

## 2020-07-13 DIAGNOSIS — I10 Essential (primary) hypertension: Secondary | ICD-10-CM | POA: Diagnosis present

## 2020-07-13 HISTORY — PX: IR PARACENTESIS: IMG2679

## 2020-07-13 LAB — CBC WITH DIFFERENTIAL/PLATELET
Abs Immature Granulocytes: 0.01 10*3/uL (ref 0.00–0.07)
Basophils Absolute: 0.1 10*3/uL (ref 0.0–0.1)
Basophils Relative: 1 %
Eosinophils Absolute: 0.3 10*3/uL (ref 0.0–0.5)
Eosinophils Relative: 7 %
HCT: 36.7 % — ABNORMAL LOW (ref 39.0–52.0)
Hemoglobin: 11.5 g/dL — ABNORMAL LOW (ref 13.0–17.0)
Immature Granulocytes: 0 %
Lymphocytes Relative: 22 %
Lymphs Abs: 0.9 10*3/uL (ref 0.7–4.0)
MCH: 27.7 pg (ref 26.0–34.0)
MCHC: 31.3 g/dL (ref 30.0–36.0)
MCV: 88.4 fL (ref 80.0–100.0)
Monocytes Absolute: 0.4 10*3/uL (ref 0.1–1.0)
Monocytes Relative: 10 %
Neutro Abs: 2.5 10*3/uL (ref 1.7–7.7)
Neutrophils Relative %: 60 %
Platelets: 217 10*3/uL (ref 150–400)
RBC: 4.15 MIL/uL — ABNORMAL LOW (ref 4.22–5.81)
RDW: 15.9 % — ABNORMAL HIGH (ref 11.5–15.5)
WBC: 4.2 10*3/uL (ref 4.0–10.5)
nRBC: 0 % (ref 0.0–0.2)

## 2020-07-13 LAB — COMPREHENSIVE METABOLIC PANEL
ALT: 13 U/L (ref 0–44)
AST: 22 U/L (ref 15–41)
Albumin: 2.9 g/dL — ABNORMAL LOW (ref 3.5–5.0)
Alkaline Phosphatase: 41 U/L (ref 38–126)
Anion gap: 10 (ref 5–15)
BUN: 14 mg/dL (ref 6–20)
CO2: 34 mmol/L — ABNORMAL HIGH (ref 22–32)
Calcium: 8.3 mg/dL — ABNORMAL LOW (ref 8.9–10.3)
Chloride: 96 mmol/L — ABNORMAL LOW (ref 98–111)
Creatinine, Ser: 7.76 mg/dL — ABNORMAL HIGH (ref 0.61–1.24)
GFR, Estimated: 8 mL/min — ABNORMAL LOW (ref >60)
Glucose, Bld: 82 mg/dL (ref 70–99)
Potassium: 3.1 mmol/L — ABNORMAL LOW (ref 3.5–5.1)
Sodium: 140 mmol/L (ref 135–145)
Total Bilirubin: 0.8 mg/dL (ref 0.3–1.2)
Total Protein: 5.5 g/dL — ABNORMAL LOW (ref 6.5–8.1)

## 2020-07-13 LAB — PROTEIN, PLEURAL OR PERITONEAL FLUID: Total protein, fluid: 3.7 g/dL

## 2020-07-13 LAB — BODY FLUID CELL COUNT WITH DIFFERENTIAL
Eos, Fluid: 0 %
Lymphs, Fluid: 72 %
Monocyte-Macrophage-Serous Fluid: 18 % — ABNORMAL LOW (ref 50–90)
Neutrophil Count, Fluid: 5 % (ref 0–25)
Other Cells, Fluid: 5 %
Total Nucleated Cell Count, Fluid: 360 cu mm (ref 0–1000)

## 2020-07-13 LAB — CREATININE, FLUID (PLEURAL, PERITONEAL, JP DRAINAGE): Creat, Fluid: 8.1 mg/dL

## 2020-07-13 LAB — ECHOCARDIOGRAM COMPLETE
Area-P 1/2: 2.75 cm2
Height: 70 in
MV VTI: 1.93 cm2
S' Lateral: 4.4 cm
Weight: 2814.83 oz

## 2020-07-13 LAB — LACTATE DEHYDROGENASE, PLEURAL OR PERITONEAL FLUID: LD, Fluid: 85 U/L — ABNORMAL HIGH (ref 3–23)

## 2020-07-13 LAB — MRSA NEXT GEN BY PCR, NASAL: MRSA by PCR Next Gen: NOT DETECTED

## 2020-07-13 LAB — TSH: TSH: 1.379 u[IU]/mL (ref 0.350–4.500)

## 2020-07-13 LAB — ALBUMIN, PLEURAL OR PERITONEAL FLUID: Albumin, Fluid: 2.2 g/dL

## 2020-07-13 LAB — PHOSPHORUS: Phosphorus: 3.3 mg/dL (ref 2.5–4.6)

## 2020-07-13 LAB — MAGNESIUM: Magnesium: 2.4 mg/dL (ref 1.7–2.4)

## 2020-07-13 MED ORDER — METRONIDAZOLE 500 MG/100ML IV SOLN
500.0000 mg | Freq: Three times a day (TID) | INTRAVENOUS | Status: DC
  Administered 2020-07-13 – 2020-07-15 (×5): 500 mg via INTRAVENOUS
  Filled 2020-07-13 (×4): qty 100

## 2020-07-13 MED ORDER — SODIUM CHLORIDE 0.9 % IV SOLN: 250.0000 mL | INTRAVENOUS | Status: DC | PRN

## 2020-07-13 MED ORDER — TAMSULOSIN HCL 0.4 MG PO CAPS
0.4000 mg | ORAL_CAPSULE | Freq: Every morning | ORAL | Status: DC
  Administered 2020-07-13 – 2020-07-15 (×3): 0.4 mg via ORAL
  Filled 2020-07-13 (×3): qty 1

## 2020-07-13 MED ORDER — HYDROXYZINE HCL 10 MG PO TABS
10.0000 mg | ORAL_TABLET | Freq: Three times a day (TID) | ORAL | Status: DC | PRN
  Administered 2020-07-13 – 2020-07-14 (×3): 10 mg via ORAL
  Filled 2020-07-13 (×3): qty 1

## 2020-07-13 MED ORDER — CHLORHEXIDINE GLUCONATE CLOTH 2 % EX PADS
6.0000 | MEDICATED_PAD | Freq: Every day | CUTANEOUS | Status: DC
  Administered 2020-07-13 – 2020-07-14 (×2): 6 via TOPICAL

## 2020-07-13 MED ORDER — CHLORHEXIDINE GLUCONATE CLOTH 2 % EX PADS: 6.0000 | MEDICATED_PAD | Freq: Every day | CUTANEOUS | Status: DC

## 2020-07-13 MED ORDER — CIPROFLOXACIN HCL 500 MG PO TABS
500.0000 mg | ORAL_TABLET | Freq: Every day | ORAL | Status: DC
  Administered 2020-07-13 – 2020-07-14 (×2): 500 mg via ORAL
  Filled 2020-07-13 (×2): qty 1

## 2020-07-13 MED ORDER — LIDOCAINE HCL 1 % IJ SOLN
INTRAMUSCULAR | Status: DC | PRN
  Administered 2020-07-13: 10 mL

## 2020-07-13 MED ORDER — ACETAMINOPHEN 325 MG PO TABS
650.0000 mg | ORAL_TABLET | Freq: Four times a day (QID) | ORAL | Status: DC | PRN
  Administered 2020-07-14: 650 mg via ORAL
  Filled 2020-07-13: qty 2

## 2020-07-13 MED ORDER — ATORVASTATIN CALCIUM 10 MG PO TABS
20.0000 mg | ORAL_TABLET | Freq: Every day | ORAL | Status: DC
  Administered 2020-07-13 – 2020-07-15 (×3): 20 mg via ORAL
  Filled 2020-07-13 (×3): qty 2

## 2020-07-13 MED ORDER — CIPROFLOXACIN HCL 500 MG PO TABS: 500.0000 mg | ORAL_TABLET | ORAL | Status: DC

## 2020-07-13 MED ORDER — ALBUTEROL SULFATE (2.5 MG/3ML) 0.083% IN NEBU: 2.5000 mg | INHALATION_SOLUTION | Freq: Four times a day (QID) | RESPIRATORY_TRACT | Status: DC | PRN

## 2020-07-13 MED ORDER — SODIUM CHLORIDE 0.9% FLUSH: 3.0000 mL | INTRAVENOUS | Status: DC | PRN

## 2020-07-13 MED ORDER — CALCIUM ACETATE (PHOS BINDER) 667 MG PO CAPS
2668.0000 mg | ORAL_CAPSULE | Freq: Three times a day (TID) | ORAL | Status: DC
Start: 2020-07-13 — End: 2020-07-15
  Administered 2020-07-13 – 2020-07-15 (×6): 2668 mg via ORAL
  Filled 2020-07-13 (×7): qty 4

## 2020-07-13 MED ORDER — ALBUMIN HUMAN 25 % IV SOLN
75.0000 g | Freq: Once | INTRAVENOUS | Status: AC
  Administered 2020-07-13: 75 g via INTRAVENOUS
  Filled 2020-07-13: qty 300

## 2020-07-13 MED ORDER — IOHEXOL 300 MG/ML  SOLN
10.0000 mL | Freq: Once | INTRAMUSCULAR | Status: AC | PRN
  Administered 2020-07-13: 10 mL

## 2020-07-13 MED ORDER — CALCITRIOL 0.5 MCG PO CAPS
0.5000 ug | ORAL_CAPSULE | ORAL | Status: DC
  Administered 2020-07-14: 0.5 ug via ORAL
  Filled 2020-07-13: qty 1

## 2020-07-13 MED ORDER — FENTANYL CITRATE (PF) 100 MCG/2ML IJ SOLN
25.0000 ug | Freq: Once | INTRAMUSCULAR | Status: AC
  Administered 2020-07-13: 25 ug via INTRAVENOUS
  Filled 2020-07-13: qty 2

## 2020-07-13 MED ORDER — HYDROCODONE-ACETAMINOPHEN 5-325 MG PO TABS: 1.0000 | ORAL_TABLET | ORAL | Status: DC | PRN

## 2020-07-13 MED ORDER — SODIUM CHLORIDE 0.9% FLUSH
3.0000 mL | Freq: Two times a day (BID) | INTRAVENOUS | Status: DC
  Administered 2020-07-13 – 2020-07-14 (×5): 3 mL via INTRAVENOUS

## 2020-07-13 MED ORDER — ACETAMINOPHEN 650 MG RE SUPP: 650.0000 mg | Freq: Four times a day (QID) | RECTAL | Status: DC | PRN

## 2020-07-13 MED ORDER — POLYETHYLENE GLYCOL 3350 17 G PO PACK: 17.0000 g | PACK | Freq: Every day | ORAL | Status: DC | PRN

## 2020-07-13 MED ORDER — LISDEXAMFETAMINE DIMESYLATE 30 MG PO CAPS
30.0000 mg | ORAL_CAPSULE | Freq: Every morning | ORAL | Status: DC
Start: 2020-07-13 — End: 2020-07-15
  Administered 2020-07-13 – 2020-07-15 (×3): 30 mg via ORAL
  Filled 2020-07-13 (×3): qty 1

## 2020-07-13 MED ORDER — SODIUM CHLORIDE 0.9 % IV SOLN
2.0000 g | INTRAVENOUS | Status: DC
  Administered 2020-07-13 – 2020-07-14 (×2): 2 g via INTRAVENOUS
  Filled 2020-07-13 (×2): qty 20

## 2020-07-13 MED ORDER — LIDOCAINE HCL 1 % IJ SOLN
INTRAMUSCULAR | Status: AC
  Filled 2020-07-13: qty 20

## 2020-07-13 NOTE — Progress Notes (Signed)
Patient arrived to unit [place] bed 05 from [facility] patient to bed by nursing staff.Oriented patient to nursing unit and call bell system.Call bell ,phone and personal items within reach no distress noted.

## 2020-07-13 NOTE — Progress Notes (Signed)
PROGRESS NOTE   ADD DINAPOLI  SWH:[id number] DOB: [birth year] DOA: 07/12/2020 PCP: [name], MD  Brief Narrative:  15 bm community dwelling BPH + LUTS status post TURP 05/25/2020 Dr. Lovena Neighbours ESRD MWF?  Transplant-followed at [facility]  chronic nonischemic cardiomyopathy EF --tachypalpitations seen 06/24/2020 and the plan was for Zio patch with heart failure team 20-25%--HF improved EF 45% 2021 Suprapubic catheter placed by IR 07/09/2020- Prior intermediate probability PE 2018+?  GI bleed Mitral valve annuloplasty 06/29/2016 Dr. Roxy Manns Prior CVA  Seen in cardiology office 06/24/2020 tachycardia palpitations near syncope-had heart rate in the 140s and felt like he was going to pass out On eval-found to be in sinus rhythm blood pressure stable  Tells me he went to an urgent care or ED end of June they attempted--he was having retention of urine, they attempted to catheterize him and apparently they were "rough" and they could not get it in--he had a supra Pubic catheter placed on 7/7 by IR  Poor suprapubic catheter drainage called his urologist and came to the ED at their request Work-up in ED = ascites on CT scan  Hospital-Problem based course  Ascites DDx bladder injury causing uroperitoneum? D/W Dr. Gloriann Loan urologist who will see-CT cystogram does not show extravasation bladder leak-uroperitoneum ??--defer next decisions to them Work-up ascites pending---ascitic neutrophil count low (5) LDH 85 protein 2.2--Follow echocardiogram from admission--- may require ultrasound abd/pelv Cultures pending gram stain also in process ---> Does not seem like SBP-has history HF + ESRD--still does produce urine 6.7 L fluid drained off at tap-75 g of albumin given Will cover with ceftriaxone Flagyl at this time as unclear if this is uroperitoneum-can de-escalate if nothing on gram stain Now that he has suprapubic probably can discontinue Flomax per urologist ESRD MWF on transplant list [facility]  consulted- will see for routine dialysis continue calcitriol 0.5 mcg MWF, PhosLo 2.6 3 times daily Holding Lasix 80 daily at this time Chronic nonischemic cardiomyopathy tachycardia palpitations wearing Zio patch HFrEF-->HF improved EF to 45% 2021 Severe MR status post annuloplasty 06/21/2016 no prior Echo being repeated as above to rule out cardiac ascites Will inform heart failure team patient is here for nonemergent follow-up Anxiety Continue Vynase 30 a.m., added Atarax 10 3 times daily as needed Prior indeterminant PE No longer on anticoagulation  DVT prophylaxis: SCD Code Status: Full Family Communication: Long discussion with the patient's wife at the bedside explaining the next steps and need for specialist input Disposition:  Status is: Inpatient  Remains inpatient appropriate because:Hemodynamically unstable, Unsafe d/c plan, IV treatments appropriate due to intensity of illness or inability to take PO, and Inpatient level of care appropriate due to severity of illness  Dispo: The patient is from: Home              Anticipated d/c is to: Home              Patient currently is not medically stable to d/c.   Difficult to place patient No       Consultants:  urologist Dr. Gloriann Loan  Procedures: CT Cystourethrogram 7/12  Antimicrobials: Ceftriaxone Flagyl started 7/12   Subjective: Quite anxious wants to go home I tried to explain to him that we do not have a clear cause of the swelling in his belly and that the tests 0.2 probably this being fluid He tells me that he had labs at his PCPs office a week and a half ago which were entirely normal with regards to his  liver I spoke to the urologist in detail and they will be seeing him today  Objective: Vitals:   07/13/20 0624 07/13/20 0910 07/13/20 1446 07/13/20 1706  BP: 111/84 108/86 109/80 125/90  Pulse: 77 76 80 81  Resp: 18 17 18 18   Temp: 98.5 F (36.9 C) 98.6 F (37 C)    TempSrc: Oral Oral    SpO2: 97% 96%   98%  Weight:      Height:        Intake/Output Summary (Last 24 hours) at 07/13/2020 1736 Last data filed at 07/13/2020 1533 Gross per 24 hour  Intake 640 ml  Output 50 ml  Net 590 ml   Filed Weights   07/12/20 1641 07/13/20 0242  Weight: 80.8 kg 79.8 kg    Examination:  EOMI NCAT no focal deficit CTA B Quite anxious Abdomen flat-suprapubic in place draining some urine-cannot appreciate any hepatosplenomegaly although I think I feel the tip of the liver? No lower extremity edema cannot appreciate JVD even with hepatojugular reflex Neurologically intact no focal deficit moving all 4 limbs Sensory intact  Data Reviewed: personally reviewed   CBC    Component Value Date/Time   WBC 4.2 07/13/2020 0421   RBC 4.15 (L) 07/13/2020 0421   HGB 11.5 (L) 07/13/2020 0421   HGB 10.4 (L) 05/01/2016 0738   HCT 36.7 (L) 07/13/2020 0421   HCT 32.8 (L) 05/01/2016 0738   PLT 217 07/13/2020 0421   PLT 291 05/01/2016 0738   MCV 88.4 07/13/2020 0421   MCV 78 (L) 05/01/2016 0738   MCH 27.7 07/13/2020 0421   MCHC 31.3 07/13/2020 0421   RDW 15.9 (H) 07/13/2020 0421   RDW 17.7 (H) 05/01/2016 0738   LYMPHSABS 0.9 07/13/2020 0421   LYMPHSABS 0.9 05/01/2016 0738   MONOABS 0.4 07/13/2020 0421   EOSABS 0.3 07/13/2020 0421   EOSABS 0.1 05/01/2016 0738   BASOSABS 0.1 07/13/2020 0421   BASOSABS 0.0 05/01/2016 0738   CMP Latest Ref Rng & Units 07/13/2020 07/12/2020 07/09/2020  Glucose 70 - 99 mg/dL 82 82 87  BUN 6 - 20 mg/dL 14 16 34(H)  Creatinine 0.61 - 1.24 mg/dL 7.76(H) 7.13(H) 9.79(H)  Sodium 135 - 145 mmol/L 140 140 140  Potassium 3.5 - 5.1 mmol/L 3.1(L) 3.7 4.1  Chloride 98 - 111 mmol/L 96(L) 95(L) 97(L)  CO2 22 - 32 mmol/L 34(H) 33(H) 32  Calcium 8.9 - 10.3 mg/dL 8.3(L) 8.4(L) 8.9  Total Protein 6.5 - 8.1 g/dL 5.5(L) 6.5 -  Total Bilirubin 0.3 - 1.2 mg/dL 0.8 0.5 -  Alkaline Phos 38 - 126 U/L 41 46 -  AST 15 - 41 U/L 22 27 -  ALT 0 - 44 U/L 13 16 -     Radiology Studies: CT  PELVIS WO CONTRAST  Result Date: 07/12/2020 CLINICAL DATA:  55 year old male with bladder mass. Suprapubic catheter placed 3 days ago with minimal drainage. Evaluate for location the pigtail catheter. EXAM: CT PELVIS WITHOUT CONTRAST TECHNIQUE: Multidetector CT imaging of the pelvis was performed following the standard protocol without intravenous contrast. COMPARISON:  CT abdomen pelvis dated 01/22/2020. FINDINGS: Evaluation of this exam is limited in the absence of intravenous contrast. Urinary Tract: The urinary bladder is decompressed around a suprapubic catheter. The pigtail tip of the suprapubic catheter is located within the bladder lumen. Bowel: No bowel dilatation or evidence of obstruction. The appendix is normal. Vascular/Lymphatic: Minimal atherosclerotic calcification of the iliac arteries. Reproductive: The prostate and seminal vesicles are grossly unremarkable. Other:  There is moderate ascites. As well as anasarca. There has been significant interval increase in the size of the status compared to the CT of 01/22/2020. Clinical correlation is recommended. Musculoskeletal: Degenerative changes primarily at L5-S1. No acute osseous pathology. IMPRESSION: 1. Suprapubic catheter with pigtail tip located within the bladder lumen. The bladder is completely decompressed around the catheter. 2. Moderate ascites, significantly increased since 01/22/2020. Clinical correlation is recommended. These results were called by telephone at the time of interpretation on 07/12/2020 at 8:34 pm to provider MATTHEW TRIFAN , who verbally acknowledged these results. Electronically Signed   By: Anner Crete M.D.   On: 07/12/2020 20:34   ECHOCARDIOGRAM COMPLETE  Result Date: 07/13/2020    ECHOCARDIOGRAM REPORT   Patient Name:   Vernon Blackburn Date of Exam: 07/13/2020 Medical Rec #:  [id number]       Height:       70.0 in Accession #:    [id number]      Weight:       175.9 lb Date of Birth:  [birth year]        BSA:           1.977 m Patient Age:    9 years        BP:           108/86 mmHg Patient Gender: M               HR:           76 bpm. Exam Location:  Inpatient Procedure: 2D Echo, 3D Echo, Color Doppler, Cardiac Doppler and Strain Analysis Indications:    CHF-Acute Systolic Q65.78  History:        Patient has prior history of Echocardiogram examinations, most                 recent 03/26/2019. CHF, Mitral Valve Disease and MR repair,                 MR; Risk Factors:Hypertension. CKD.                  Mitral Valve: 30 mm Ring Mitral Memo 3D 30MM valve is present in                 the mitral position. Procedure Date: 06/29/2016.  Sonographer:    Darlina Sicilian RDCS Referring Phys: [place]  1. Left ventricular ejection fraction, by estimation, is 50 to 55%. The left ventricle has low normal function. The left ventricle demonstrates global hypokinesis. The left ventricular internal cavity size was mildly dilated. Left ventricular diastolic function could not be evaluated.  2. Right ventricular systolic function is normal. The right ventricular size is normal.  3. The mitral valve has been repaired/replaced. Trivial mitral valve regurgitation. No evidence of mitral stenosis. The mean mitral valve gradient is 5.5 mmHg. There is a 30 mm Ring Mitral Memo 3D 30MM present in the mitral position. Procedure Date: 06/29/2016.  4. The aortic valve is normal in structure. Aortic valve regurgitation is not visualized. No aortic stenosis is present.  5. Aortic dilatation noted. There is dilatation of the aortic root, measuring 37 mm.  6. The inferior vena cava is normal in size with greater than 50% respiratory variability, suggesting right atrial pressure of 3 mmHg.  7. No significant change compated to TEE 2021 FINDINGS  Left Ventricle: Left ventricular ejection fraction, by estimation, is 50 to 55%. The left ventricle has low normal function. The left ventricle  demonstrates global hypokinesis. The left ventricular  internal cavity size was mildly dilated. There is no left ventricular hypertrophy. Left ventricular diastolic function could not be evaluated due to mitral valve repair. Left ventricular diastolic function could not be evaluated. Right Ventricle: The right ventricular size is normal. No increase in right ventricular wall thickness. Right ventricular systolic function is normal. Left Atrium: Left atrial size was normal in size. Right Atrium: Right atrial size was normal in size. Pericardium: There is no evidence of pericardial effusion. Mitral Valve: The mitral valve has been repaired/replaced. There is moderate thickening of the mitral valve leaflet(s). Trivial mitral valve regurgitation. There is a 30 mm Ring Mitral Memo 3D 30MM present in the mitral position. Procedure Date: 06/29/2016. No evidence of mitral valve stenosis. MV peak gradient, 10.2 mmHg. The mean mitral valve gradient is 5.5 mmHg. Tricuspid Valve: The tricuspid valve is normal in structure. Tricuspid valve regurgitation is not demonstrated. No evidence of tricuspid stenosis. Aortic Valve: The aortic valve is normal in structure. Aortic valve regurgitation is not visualized. No aortic stenosis is present. Pulmonic Valve: The pulmonic valve was normal in structure. Pulmonic valve regurgitation is not visualized. No evidence of pulmonic stenosis. Aorta: The aortic root is normal in size and structure and aortic dilatation noted. There is dilatation of the aortic root, measuring 37 mm. Venous: The inferior vena cava is normal in size with greater than 50% respiratory variability, suggesting right atrial pressure of 3 mmHg. IAS/Shunts: No atrial level shunt detected by color flow Doppler.  LEFT VENTRICLE PLAX 2D LVIDd:         5.50 cm  Diastology LVIDs:         4.40 cm  LV e' medial:    5.11 cm/s LV PW:         1.10 cm  LV E/e' medial:  24.0 LV IVS:        1.00 cm  LV e' lateral:   9.67 cm/s LVOT diam:     2.50 cm  LV E/e' lateral: 12.7 LV SV:         75  LV SV Index:   38 LVOT Area:     4.91 cm  RIGHT VENTRICLE RV S prime:     10.90 cm/s TAPSE (M-mode): 1.6 cm LEFT ATRIUM             Index       RIGHT ATRIUM           Index LA diam:        4.00 cm 2.02 cm/m  RA Area:     14.30 cm LA Vol (A2C):   45.8 ml 23.17 ml/m RA Volume:   32.40 ml  16.39 ml/m LA Vol (A4C):   63.2 ml 31.97 ml/m LA Biplane Vol: 60.0 ml 30.36 ml/m  AORTIC VALVE LVOT Vmax:   95.80 cm/s LVOT Vmean:  63.750 cm/s LVOT VTI:    0.152 m  AORTA Ao Root diam: 3.70 cm Ao Asc diam:  3.10 cm MITRAL VALVE MV Area (PHT): 2.75 cm     SHUNTS MV Area VTI:   1.93 cm     Systemic VTI:  0.15 m MV Peak grad:  10.2 mmHg    Systemic Diam: 2.50 cm MV Mean grad:  5.5 mmHg MV Vmax:       1.60 m/s MV Vmean:      112.5 cm/s MV Decel Time: 276 msec MV E velocity: 122.67 cm/s MV A velocity: 163.67 cm/s MV E/A ratio:  0.75 Fransico Him MD Electronically signed by Fransico Him MD Signature Date/Time: 07/13/2020/2:14:04 PM    Final    CT CYSTOGRAM PELVIS  Result Date: 07/13/2020 CLINICAL DATA:  Abdominal distension. Suprapubic catheter is not drawn. Assess for possible bladder injury. EXAM: CT CYSTOGRAM (CT PELVIS WITH CONTRAST) TECHNIQUE: Multidetector CT imaging through the pelvis was performed after dilute contrast had been introduced into the bladder for the purposes of performing CT cystography. CONTRAST:  23mL OMNIPAQUE IOHEXOL 300 MG/ML  SOLN COMPARISON:  CT pelvis 07/12/2020 FINDINGS: Urinary Tract: There is contrast in the bladder. No evidence of extravasation/bladder leak. Moderate diffuse bladder wall thickening. The suprapubic catheter is in good position without complicating features. Surgical changes from recent TURP with contrast extending down into the TURP defect. The prostate gland is otherwise unremarkable. The seminal vesicles are normal. Bowel: The rectum, sigmoid colon and visualized small bowel loops unremarkable. Moderate free pelvic fluid is noted. Vascular/Lymphatic: Scattered vascular  calcifications are stable. Reproductive:  TURP defect.  No other significant findings. Other:  Moderate lower abdominal and free pelvic fluid. Musculoskeletal: No significant bony findings. IMPRESSION: 1. Surgical changes from recent TURP with contrast extending down into the TURP defect. No evidence of extravasation/bladder leak. 2. Intact suprapubic catheter without complicating features. 3. Moderate lower abdominal and free pelvic fluid. Electronically Signed   By: Marijo Sanes M.D.   On: 07/13/2020 16:45   IR Paracentesis  Result Date: 07/13/2020 INDICATION: Patient with history of end-stage renal disease with prior peritoneal dialysis, hypertension, CHF, urethral stricture, BPH, recent suprapubic catheter placement, ascites. Request received for diagnostic and therapeutic paracentesis. EXAM: ULTRASOUND GUIDED DIAGNOSTIC AND THERAPEUTIC PARACENTESIS MEDICATIONS: 1% lidocaine to skin and subcutaneous tissue COMPLICATIONS: None immediate. PROCEDURE: Informed written consent was obtained from the patient after a discussion of the risks, benefits and alternatives to treatment. A timeout was performed prior to the initiation of the procedure. Initial ultrasound scanning demonstrates a large amount of ascites within the left lower abdominal quadrant. The left lower abdomen was prepped and draped in the usual sterile fashion. 1% lidocaine was used for local anesthesia. Following this, a 19 gauge, 10-cm, Yueh catheter was introduced. An ultrasound image was saved for documentation purposes. The paracentesis was performed. The catheter was removed and a dressing was applied. The patient tolerated the procedure well without immediate post procedural complication. FINDINGS: A total of approximately 6.7 liters of yellow fluid was removed. Samples were sent to the laboratory as requested by the clinical team. IMPRESSION: Successful ultrasound-guided diagnostic and therapeutic paracentesis yielding 6.7 liters of  peritoneal fluid. Read by: Rowe Robert, PA-C Electronically Signed   By: Corrie Mckusick D.O.   On: 07/13/2020 12:06     Scheduled Meds:  atorvastatin  20 mg Oral Daily   [START ON 07/14/2020] calcitRIOL  0.5 mcg Oral Q M,W,F   calcium acetate  2,668 mg Oral TID WC   Chlorhexidine Gluconate Cloth  6 each Topical Daily   ciprofloxacin  500 mg Oral Q breakfast   lisdexamfetamine  30 mg Oral q morning   sodium chloride flush  3 mL Intravenous Q12H   tamsulosin  0.4 mg Oral q AM   Continuous Infusions:  sodium chloride       LOS: 1 day   Time spent: [place], MD [facility] To contact the attending provider between 7A-7P or the covering provider during after hours 7P-7A, please log into the web site [email or web address] and access using universal [place] password for that web site.  If you do not have the password, please call the hospital operator.  07/13/2020, 5:36 PM

## 2020-07-13 NOTE — Consult Note (Signed)
H&P Physician requesting consult: Nita Sells  Chief Complaint: Intra-abdominal fluid, recent suprapubic catheter placement  History of Present Illness: 55 year old male status post TURP on 05/25/2020.  He is on dialysis Monday Wednesday Friday but does make urine.  He recently presented to the office with inability to void.  He was found to have a completely occluded urethra.  He therefore underwent suprapubic tube placement of a 12 French suprapubic catheter by CT guidance by interventional radiology on 07/09/2020.  He went to the emergency department today with increasing abdominal pain and swelling.  He was found to have on CT scan intra-abdominal fluid.  He subsequently underwent paracentesis that yielded 6.7 L of yellow fluid.  Creatinine of the fluid was 8.1.  This is consistent with serum value.  Therefore, this points against urine.  I ordered a CT cystogram that revealed adequate placement of the suprapubic catheter.  There was no extravasation of contrast from the bladder.  Patient states he has had low output from the catheter.  Past Medical History:  Diagnosis Date   ADHD    Anemia, chronic renal failure    Asthma    followed by pcp   Benign localized prostatic hyperplasia with lower urinary tract symptoms (LUTS)    Bladder stones    BPH (benign prostatic hyperplasia)    Chronic gout    05-06-2020  per pt last episode 6 months ago,  (involves right knee and right great toe)   Chronic systolic HF (heart failure) ([place])    followed by cardiology, dr Aundra Dubin @ [facility]F clinic,   last TEE 03-06-2019 in epic ef 45%   Dyspnea    ESRD on hemodialysis [facility])    primary nephrologist--- dr Moshe Cipro--- HD on M/W/ F  at [place] at [facility] in [place] murmur    History of bladder stone    History of cellulitis 01/27/2020   ED visit ,  abdominal wall   History of CVA (cerebrovascular accident) without residual deficits    remote infarct per imaging MRI brain in  care every where 02-07-2019   History of kidney stones    History of obstructive sleep apnea    05-06-2020  per pt test positive for osa prior to gastric bypass in 2014 used cpap,  pt stated was retested after alot of weight loss , test was negative    Hypertension    followed by cardiology   NICM (nonischemic cardiomyopathy) ([place])    followed by cardiology---- 01/ 2018  ef 20-35%, severe MV stenosis;  last TEE echo in epic 03-06-2019  ef 45%   Peripheral edema    Bilateral lower extremity    S/P minimally invasive mitral valve repair followed by cardiology   06-29-2016  for severe stenosis ;  30 mm [name] via right mini thoracotomy approach;  last TEE echo in epic 03-06-2019 ef 45%, mild LVH, post op mild MR with mean gradient 90mmHg and no significant stenosis   Seasonal and perennial allergic rhinitis    Sleep apnea    Past Surgical History:  Procedure Laterality Date   AV FISTULA PLACEMENT Left 03/10/2019   Procedure: LEFT ARM BASILIC ARTERIOVENOUS (AV) FISTULA CREATION FIRST STAGE;  Surgeon: Marty Heck, MD;  Location: [place];  Service: Vascular;  Laterality: Left;   BASCILIC VEIN TRANSPOSITION Left 05/01/2019   Procedure: LEFT SECOND STAGE [place].;  Surgeon: Marty Heck, MD;  Location: [place];  Service: Vascular;  Laterality:  Left;   CYSTOSCOPY/URETEROSCOPY/HOLMIUM LASER/STENT PLACEMENT Right 07/17/2018   Procedure: CYSTOSCOPY/RETROGRADE/URETEROSCOPY/HOLMIUM LASER/STENT PLACEMENT/ CYSTOLITHALOPAXY;  Surgeon: Ceasar Mons, MD;  [facility];  Service: Urology;  Laterality: Right;   GASTRIC BYPASS  01/2012   IR PARACENTESIS  07/13/2020   KNEE ARTHROSCOPY W/ MENISCAL REPAIR Right 05/2008   LAPAROSCOPIC INGUINAL HERNIA REPAIR Left 03-24-2015  @ [facility]   MITRAL VALVE REPAIR Right 06/29/2016   Procedure: MINIMALLY INVASIVE MITRAL VALVE REPAIR  (MVR);  Surgeon: [name], MD;  Location: [place];  Service: Open  Heart Surgery;  Laterality: Right;   PERITONEAL CATHETER INSERTION  08-18-2019  @[facility]    AND OPEN RIGHT INGUINAL HERNIA REPAIR   PERITONEAL CATHETER REMOVAL  03-31-2020  @[facility]    RIGHT/LEFT HEART CATH AND CORONARY ANGIOGRAPHY N/A 06/07/2016   Procedure: Right/Left Heart Cath and Coronary Angiography;  Surgeon: Larey Dresser, MD;  Location: [place] [facility];  Service: Cardiovascular;  Laterality: N/A;   TEE WITHOUT CARDIOVERSION N/A 05/04/2016   Procedure: TRANSESOPHAGEAL ECHOCARDIOGRAM (TEE);  Surgeon: Larey Dresser, MD;  Location: [facility] ENDOSCOPY;  Service: Cardiovascular;  Laterality: N/A;   TEE WITHOUT CARDIOVERSION N/A 06/29/2016   Procedure: TRANSESOPHAGEAL ECHOCARDIOGRAM (TEE);  Surgeon: [name], MD;  Location: [place];  Service: Open Heart Surgery;  Laterality: N/A;   TEE WITHOUT CARDIOVERSION N/A 03/06/2019   Procedure: TRANSESOPHAGEAL ECHOCARDIOGRAM (TEE);  Surgeon: Larey Dresser, MD;  Location: [facility]OSCOPY;  Service: Cardiovascular;  Laterality: N/A;   TRANSURETHRAL RESECTION OF PROSTATE N/A 05/25/2020   Procedure: TRANSURETHRAL RESECTION OF THE PROSTATE (TURP) WITH CYSTOLITHOLAPAXY;  Surgeon: Ceasar Mons, MD;  [facility];  Service: Urology;  Laterality: N/A;   UMBILICAL HERNIA REPAIR  09-05-2012  @[email or web address]     Home Medications:  Medications Prior to Admission  Medication Sig Dispense Refill Last Dose   Albuterol Sulfate (PROAIR RESPICLICK) 270 (90 Base) MCG/ACT AEPB Inhale 2 puffs into the lungs every 6 (six) hours as needed (wheezing/shortness of breath).   07/12/2020   atorvastatin (LIPITOR) 20 MG tablet Take 20 mg by mouth daily.   07/12/2020   Budeson-Glycopyrrol-Formoterol (BREZTRI AEROSPHERE) 160-9-4.8 MCG/ACT AERO Inhale 2 puffs into the lungs 2 (two) times daily as needed (congestion/respiratory issues.).   07/11/2020   calcitRIOL (ROCALTROL) 0.5 MCG capsule Take 0.5 mcg by mouth every Monday, Wednesday, and Friday.   07/12/2020   calcium acetate (PHOSLO)  667 MG capsule Take 2,668 mg by mouth 3 (three) times daily with meals.   07/12/2020   carvedilol (COREG) 12.5 MG tablet Take 12.5 mg by mouth daily at 6 (six) AM.   07/12/2020 at 8 am   ciprofloxacin (CIPRO) 500 MG tablet Take 500 mg by mouth daily. Start date : 07/08/20   07/11/2020   Febuxostat 80 MG TABS Take 80 mg by mouth daily.   07/12/2020   fexofenadine (ALLEGRA) 180 MG tablet Take 180 mg by mouth in the morning.   07/12/2020   fluocinonide ointment (LIDEX) 6.23 % Apply 1 application topically 2 (two) times daily as needed (eczema). (Hands)   07/08/2020   fluticasone (FLONASE) 50 MCG/ACT nasal spray Place 1 spray into both nostrils daily as needed for allergies or rhinitis.    07/12/2020   furosemide (LASIX) 80 MG tablet Take 80 mg by mouth daily.   07/11/2020   lactulose (CHRONULAC) 10 GM/15ML solution Take 10 g by mouth 2 (two) times daily as needed for mild constipation.   07/11/2020   pimecrolimus (ELIDEL) 1 % cream Apply topically 2 (two) times  daily. (Patient taking differently: Apply 1 application topically 2 (two) times daily as needed (eczema).) 100 g 2 06/08/20   polyethylene glycol (MIRALAX / GLYCOLAX) 17 g packet Take 17 g by mouth daily as needed.   07/11/2020   sildenafil (REVATIO) 20 MG tablet Take 20 mg by mouth daily as needed (erectile dysfunction).   Past Month   tacrolimus (PROTOPIC) 0.1 % ointment Apply 1 application topically 2 (two) times daily as needed (eczema). (eyelids/hands)   07/08/2020   tamsulosin (FLOMAX) 0.4 MG CAPS capsule Take 0.4 mg by mouth in the morning.   07/12/2020   VYVANSE 30 MG capsule Take 30 mg by mouth every morning.   07/12/2020   phenazopyridine (PYRIDIUM) 200 MG tablet Take 1 tablet (200 mg total) by mouth 3 (three) times daily as needed (for pain with urination). (Patient not taking: No sig reported) 30 tablet 0 Not Taking   Allergies:  Allergies  Allergen Reactions   Isosorbide     Hypotension     Family History  Problem Relation Age of Onset    Diabetes Mother    Hypertension Mother    Heart failure Mother    Colon cancer Neg Hx    Esophageal cancer Neg Hx    Rectal cancer Neg Hx    Stomach cancer Neg Hx    Social History:  reports that he has never smoked. He has never used smokeless tobacco. He reports previous alcohol use. He reports that he does not use drugs.  ROS: A complete review of systems was performed.  All systems are negative except for pertinent findings as noted. ROS   Physical Exam:  Vital signs in last 24 hours: Temp:  [98.3 F (36.8 C)-98.6 F (37 C)] 98.5 F (36.9 C) (07/12 1706) Pulse Rate:  [66-81] 81 (07/12 1706) Resp:  [16-20] 18 (07/12 1706) BP: (103-137)/(66-101) 125/90 (07/12 1706) SpO2:  [95 %-100 %] 98 % (07/12 1706) Weight:  [79.8 kg] 79.8 kg (07/12 0242) General:  Alert and oriented, No acute distress HEENT: Normocephalic, atraumatic Neck: No JVD or lymphadenopathy Cardiovascular: Regular rate and rhythm Lungs: Regular rate and effort Abdomen: Soft, nontender, nondistended, no abdominal masses.  He has a 12 French suprapubic catheter in place.  I irrigated this and it irrigated easily and appropriately Back: No CVA tenderness Extremities: No edema Neurologic: Grossly intact  Laboratory Data:  Results for orders placed or performed during the hospital encounter of 07/12/20 (from the past 24 hour(s))  Comprehensive metabolic panel     Status: Abnormal   Collection Time: 07/12/20  8:33 PM  Result Value Ref Range   Sodium 140 135 - 145 mmol/L   Potassium 3.7 3.5 - 5.1 mmol/L   Chloride 95 (L) 98 - 111 mmol/L   CO2 33 (H) 22 - 32 mmol/L   Glucose, Bld 82 70 - 99 mg/dL   BUN 16 6 - 20 mg/dL   Creatinine, Ser 7.13 (H) 0.61 - 1.24 mg/dL   Calcium 8.4 (L) 8.9 - 10.3 mg/dL   Total Protein 6.5 6.5 - 8.1 g/dL   Albumin 3.5 3.5 - 5.0 g/dL   AST 27 15 - 41 U/L   ALT 16 0 - 44 U/L   Alkaline Phosphatase 46 38 - 126 U/L   Total Bilirubin 0.5 0.3 - 1.2 mg/dL   GFR, Estimated 8 (L) >60  mL/min   Anion gap 12 5 - 15  CBC with Differential     Status: Abnormal   Collection Time: 07/12/20  8:33 PM  Result Value Ref Range   WBC 4.3 4.0 - 10.5 K/uL   RBC 4.41 4.22 - 5.81 MIL/uL   Hemoglobin 12.3 (L) 13.0 - 17.0 g/dL   HCT 39.9 39.0 - 52.0 %   MCV 90.5 80.0 - 100.0 fL   MCH 27.9 26.0 - 34.0 pg   MCHC 30.8 30.0 - 36.0 g/dL   RDW 16.1 (H) 11.5 - 15.5 %   Platelets 214 150 - 400 K/uL   nRBC 0.0 0.0 - 0.2 %   Neutrophils Relative % 57 %   Neutro Abs 2.5 1.7 - 7.7 K/uL   Lymphocytes Relative 23 %   Lymphs Abs 1.0 0.7 - 4.0 K/uL   Monocytes Relative 11 %   Monocytes Absolute 0.5 0.1 - 1.0 K/uL   Eosinophils Relative 8 %   Eosinophils Absolute 0.3 0.0 - 0.5 K/uL   Basophils Relative 1 %   Basophils Absolute 0.0 0.0 - 0.1 K/uL   Immature Granulocytes 0 %   Abs Immature Granulocytes 0.00 0.00 - 0.07 K/uL  Protime-INR     Status: None   Collection Time: 07/12/20  8:33 PM  Result Value Ref Range   Prothrombin Time 13.7 11.4 - 15.2 seconds   INR 1.1 0.8 - 1.2  Brain natriuretic peptide     Status: None   Collection Time: 07/12/20  8:34 PM  Result Value Ref Range   B Natriuretic Peptide 18.5 0.0 - 100.0 pg/mL  Resp Panel by RT-PCR (Flu A&B, Covid) Nasopharyngeal Swab     Status: None   Collection Time: 07/12/20 10:20 PM   Specimen: Nasopharyngeal Swab; Nasopharyngeal(NP) swabs in vial transport medium  Result Value Ref Range   SARS Coronavirus 2 by RT PCR NEGATIVE NEGATIVE   Influenza A by PCR NEGATIVE NEGATIVE   Influenza B by PCR NEGATIVE NEGATIVE  MRSA Next Gen by PCR, Nasal     Status: None   Collection Time: 07/13/20  3:09 AM   Specimen: Nasal Mucosa; Nasal Swab  Result Value Ref Range   MRSA by PCR Next Gen NOT DETECTED NOT DETECTED  Magnesium     Status: None   Collection Time: 07/13/20  4:21 AM  Result Value Ref Range   Magnesium 2.4 1.7 - 2.4 mg/dL  Phosphorus     Status: None   Collection Time: 07/13/20  4:21 AM  Result Value Ref Range   Phosphorus 3.3  2.5 - 4.6 mg/dL  CBC WITH DIFFERENTIAL     Status: Abnormal   Collection Time: 07/13/20  4:21 AM  Result Value Ref Range   WBC 4.2 4.0 - 10.5 K/uL   RBC 4.15 (L) 4.22 - 5.81 MIL/uL   Hemoglobin 11.5 (L) 13.0 - 17.0 g/dL   HCT 36.7 (L) 39.0 - 52.0 %   MCV 88.4 80.0 - 100.0 fL   MCH 27.7 26.0 - 34.0 pg   MCHC 31.3 30.0 - 36.0 g/dL   RDW 15.9 (H) 11.5 - 15.5 %   Platelets 217 150 - 400 K/uL   nRBC 0.0 0.0 - 0.2 %   Neutrophils Relative % 60 %   Neutro Abs 2.5 1.7 - 7.7 K/uL   Lymphocytes Relative 22 %   Lymphs Abs 0.9 0.7 - 4.0 K/uL   Monocytes Relative 10 %   Monocytes Absolute 0.4 0.1 - 1.0 K/uL   Eosinophils Relative 7 %   Eosinophils Absolute 0.3 0.0 - 0.5 K/uL   Basophils Relative 1 %   Basophils Absolute 0.1 0.0 -  0.1 K/uL   Immature Granulocytes 0 %   Abs Immature Granulocytes 0.01 0.00 - 0.07 K/uL  TSH     Status: None   Collection Time: 07/13/20  4:21 AM  Result Value Ref Range   TSH 1.379 0.350 - 4.500 uIU/mL  Comprehensive metabolic panel     Status: Abnormal   Collection Time: 07/13/20  4:21 AM  Result Value Ref Range   Sodium 140 135 - 145 mmol/L   Potassium 3.1 (L) 3.5 - 5.1 mmol/L   Chloride 96 (L) 98 - 111 mmol/L   CO2 34 (H) 22 - 32 mmol/L   Glucose, Bld 82 70 - 99 mg/dL   BUN 14 6 - 20 mg/dL   Creatinine, Ser 7.76 (H) 0.61 - 1.24 mg/dL   Calcium 8.3 (L) 8.9 - 10.3 mg/dL   Total Protein 5.5 (L) 6.5 - 8.1 g/dL   Albumin 2.9 (L) 3.5 - 5.0 g/dL   AST 22 15 - 41 U/L   ALT 13 0 - 44 U/L   Alkaline Phosphatase 41 38 - 126 U/L   Total Bilirubin 0.8 0.3 - 1.2 mg/dL   GFR, Estimated 8 (L) >60 mL/min   Anion gap 10 5 - 15  Lactate dehydrogenase (pleural or peritoneal fluid)     Status: Abnormal   Collection Time: 07/13/20 11:13 AM  Result Value Ref Range   LD, Fluid 85 (H) 3 - 23 U/L   Fluid Type-FLDH PLEURAL   Body fluid cell count with differential     Status: Abnormal   Collection Time: 07/13/20 11:13 AM  Result Value Ref Range   Fluid Type-FCT PLEURAL     Color, Fluid YELLOW (A) YELLOW   Appearance, Fluid CLEAR CLEAR   Total Nucleated Cell Count, Fluid 360 0 - 1,000 cu mm   Neutrophil Count, Fluid 5 0 - 25 %   Lymphs, Fluid 72 %   Monocyte-Macrophage-Serous Fluid 18 (L) 50 - 90 %   Eos, Fluid 0 %   Other Cells, Fluid 5 BASOS %  Albumin, pleural or peritoneal fluid      Status: None   Collection Time: 07/13/20 11:13 AM  Result Value Ref Range   Albumin, Fluid 2.2 g/dL   Fluid Type-FALB PLEURAL   Protein, pleural or peritoneal fluid     Status: None   Collection Time: 07/13/20 11:13 AM  Result Value Ref Range   Total protein, fluid 3.7 g/dL   Fluid Type-FTP PLEURAL   Creatinine, fluid (pleural, peritoneal, JP Drainage)     Status: None   Collection Time: 07/13/20 11:59 AM  Result Value Ref Range   Creat, Fluid 8.1 mg/dL   Fluid Type-FCRE PERITONEAL    Recent Results (from the past 240 hour(s))  Resp Panel by RT-PCR (Flu A&B, Covid) Nasopharyngeal Swab     Status: None   Collection Time: 07/12/20 10:20 PM   Specimen: Nasopharyngeal Swab; Nasopharyngeal(NP) swabs in vial transport medium  Result Value Ref Range Status   SARS Coronavirus 2 by RT PCR NEGATIVE NEGATIVE Final    Comment: (NOTE) SARS-CoV-2 target nucleic acids are NOT DETECTED.  The SARS-CoV-2 RNA is generally detectable in upper respiratory specimens during the acute phase of infection. The lowest concentration of SARS-CoV-2 viral copies this assay can detect is 138 copies/mL. A negative result does not preclude SARS-Cov-2 infection and should not be used as the sole basis for treatment or other patient management decisions. A negative result may occur with  improper specimen collection/handling, submission of specimen  other than nasopharyngeal swab, presence of viral mutation(s) within the areas targeted by this assay, and inadequate number of viral copies(<138 copies/mL). A negative result must be combined with clinical observations, patient history, and  epidemiological information. The expected result is Negative.  Fact Sheet for Patients:  EntrepreneurPulse.com.au  Fact Sheet for Healthcare Providers:  IncredibleEmployment.be  This test is no t yet approved or cleared by the [place] FDA and  has been authorized for detection and/or diagnosis of SARS-CoV-2 by FDA under an Emergency Use Authorization (EUA). This EUA will remain  in effect (meaning this test can be used) for the duration of the COVID-19 declaration under Section 564(b)(1) of the Act, 21 U.S.C.section 360bbb-3(b)(1), unless the authorization is terminated  or revoked sooner.       Influenza A by PCR NEGATIVE NEGATIVE Final   Influenza B by PCR NEGATIVE NEGATIVE Final    Comment: (NOTE) The Xpert Xpress SARS-CoV-2/FLU/RSV plus assay is intended as an aid in the diagnosis of influenza from Nasopharyngeal swab specimens and should not be used as a sole basis for treatment. Nasal washings and aspirates are unacceptable for Xpert Xpress SARS-CoV-2/FLU/RSV testing.  Fact Sheet for Patients: EntrepreneurPulse.com.au  Fact Sheet for Healthcare Providers: IncredibleEmployment.be  This test is not yet approved or cleared by the [place] FDA and has been authorized for detection and/or diagnosis of SARS-CoV-2 by FDA under an Emergency Use Authorization (EUA). This EUA will remain in effect (meaning this test can be used) for the duration of the COVID-19 declaration under Section 564(b)(1) of the Act, 21 U.S.C. section 360bbb-3(b)(1), unless the authorization is terminated or revoked.  Performed at [facility], [place] [address] [place], [place] [address]   MRSA Next Gen by PCR, Nasal     Status: None   Collection Time: 07/13/20  3:09 AM   Specimen: Nasal Mucosa; Nasal Swab  Result Value Ref Range Status   MRSA by PCR Next Gen NOT DETECTED NOT DETECTED Final     Comment: (NOTE) The GeneXpert MRSA Assay (FDA approved for NASAL specimens only), is one component of a comprehensive MRSA colonization surveillance program. It is not intended to diagnose MRSA infection nor to guide or monitor treatment for MRSA infections. Test performance is not FDA approved in patients less than 31 years old. Performed at [facility], [place] [address] [place], [place] [address]    Creatinine: Recent Labs    07/09/20 1016 07/12/20 2033 07/13/20 0421  CREATININE 9.79* 7.13* 7.76*   CT scan personally reviewed and is detailed in the history of present illness  Impression/Assessment:  Urinary retention secondary to urethral stricture Large volume ascites  Plan:  No evidence of any bladder injury or extravasation on CT cystogram.  I irrigated his catheter and it irrigated easily.  No current urological intervention recommended.  Marton Redwood, III 07/13/2020, 6:58 PM

## 2020-07-13 NOTE — Hospital Course (Signed)
79 bm [place] dwelling BPH + LUTS status post TURP 05/25/2020 Dr. Lovena Neighbours ESRD MWF?  Transplant-followed at [facility]  chronic nonischemic cardiomyopathy EF --tachypalpitations seen 06/24/2020 and the plan was for Zio patch with heart failure team 20-25%--HF improved EF 45% 2021 Suprapubic catheter placed by IR 07/09/2020-seen previously by Prior intermediate probability PE 2018+?  GI bleed Mitral valve annuloplasty 06/29/2016 Dr. Roxy Manns Prior CVA  Seen in cardiology office 06/24/2020 tachycardia palpitations near syncope-had heart rate in the 140s and felt like he was going to pass out On eval-found to be in sinus rhythm blood pressure stable  Poor suprapubic catheter drainage after this was placed under IR 07/09/2020-called his urologist and came to the ED at their request Work-up in ED = ascites on CT scan

## 2020-07-13 NOTE — Consult Note (Signed)
[facility] Renal Consultation Note    Indication for Consultation:  Management of ESRD/hemodialysis; anemia, hypertension/volume and secondary hyperparathyroidism  QIO:[id number], Lora Havens, MD  HPI: Vernon Blackburn is a 55 y.o. male. ESRD on HD MWF at [facility]  Past medical history significant for HTN, NICM, chronic systolic HF, Hx CVA, gout, BPH s/p TURP, urethral stricture s/p suprapubic foley catheter placement on 07/09/20.  Patient seen and examined at bedside.  Reports he was sent to the ED by Urology due to concern for obstructed catheter.  Patient reports swelling in his abdomen which progressively worsened over the last 2 weeks.   He has never had this type of pain/swelling before.  Associated symptoms include abdominal pain, decreased appetite, constipation, nausea, and shortness of breath.  Admits to weight loss due to loss of appetite.  Denies CP, edema, weakness, dizziness and fatigue.  Reports dialysis has been going well and completed last HD on 07/12/20. He was scheduled for a fistulogram due to issues with cannulation.   Pertinent findings in work up include TEE with no significant changes, CT pelvis with completely decompressed bladder around suprapubic catheter and moderate ascites s/p IR paracentesis with yield 6.7L of yellow liquid and cystogram with no evidence bladder leakage/extravasation, intact catheter with no complicated features and moderate lower abdominal and free pelvic fluid.  Patient admitted for further evaluation and management.   Past Medical History:  Diagnosis Date   ADHD    Anemia, chronic renal failure    Asthma    followed by pcp   Benign localized prostatic hyperplasia with lower urinary tract symptoms (LUTS)    Bladder stones    BPH (benign prostatic hyperplasia)    Chronic gout    05-06-2020  per pt last episode 6 months ago,  (involves right knee and right great toe)   Chronic systolic HF (heart failure) ([place])    followed by  cardiology, dr Aundra Dubin @ [facility]F clinic,   last TEE 03-06-2019 in epic ef 45%   Dyspnea    ESRD on hemodialysis [facility])    primary nephrologist--- dr Moshe Cipro--- HD on M/W/ F  at [place] at [facility] in [place] murmur    History of bladder stone    History of cellulitis 01/27/2020   ED visit ,  abdominal wall   History of CVA (cerebrovascular accident) without residual deficits    remote infarct per imaging MRI brain in care every where 02-07-2019   History of kidney stones    History of obstructive sleep apnea    05-06-2020  per pt test positive for osa prior to gastric bypass in 2014 used cpap,  pt stated was retested after alot of weight loss , test was negative    Hypertension    followed by cardiology   NICM (nonischemic cardiomyopathy) ([place])    followed by cardiology---- 01/ 2018  ef 20-35%, severe MV stenosis;  last TEE echo in epic 03-06-2019  ef 45%   Peripheral edema    Bilateral lower extremity    S/P minimally invasive mitral valve repair followed by cardiology   06-29-2016  for severe stenosis ;  30 mm [name] via right mini thoracotomy approach;  last TEE echo in epic 03-06-2019 ef 45%, mild LVH, post op mild MR with mean gradient 83mmHg and no significant stenosis   Seasonal and perennial allergic rhinitis    Sleep apnea    Past Surgical History:  Procedure Laterality Date  AV FISTULA PLACEMENT Left 03/10/2019   Procedure: LEFT ARM BASILIC ARTERIOVENOUS (AV) FISTULA CREATION FIRST STAGE;  Surgeon: Marty Heck, MD;  Location: [place];  Service: Vascular;  Laterality: Left;   [place] Left 05/01/2019   Procedure: LEFT SECOND STAGE [place].;  Surgeon: Marty Heck, MD;  Location: [place];  Service: Vascular;  Laterality: Left;   CYSTOSCOPY/URETEROSCOPY/HOLMIUM LASER/STENT PLACEMENT Right 07/17/2018   Procedure: CYSTOSCOPY/RETROGRADE/URETEROSCOPY/HOLMIUM LASER/STENT PLACEMENT/  CYSTOLITHALOPAXY;  Surgeon: Ceasar Mons, MD;  Location: [facility];  Service: Urology;  Laterality: Right;   GASTRIC BYPASS  01/2012   IR PARACENTESIS  07/13/2020   KNEE ARTHROSCOPY W/ MENISCAL REPAIR Right 05/2008   LAPAROSCOPIC INGUINAL HERNIA REPAIR Left 03-24-2015  @ [facility]   MITRAL VALVE REPAIR Right 06/29/2016   Procedure: MINIMALLY INVASIVE MITRAL VALVE REPAIR  (MVR);  Surgeon: [name], MD;  Location: [place];  Service: Open Heart Surgery;  Laterality: Right;   PERITONEAL CATHETER INSERTION  08-18-2019  @[facility]    AND OPEN RIGHT INGUINAL HERNIA REPAIR   PERITONEAL CATHETER REMOVAL  03-31-2020  @[facility]    RIGHT/LEFT HEART CATH AND CORONARY ANGIOGRAPHY N/A 06/07/2016   Procedure: Right/Left Heart Cath and Coronary Angiography;  Surgeon: Larey Dresser, MD;  Location: [place] [facility];  Service: Cardiovascular;  Laterality: N/A;   TEE WITHOUT CARDIOVERSION N/A 05/04/2016   Procedure: TRANSESOPHAGEAL ECHOCARDIOGRAM (TEE);  Surgeon: Larey Dresser, MD;  Location: [facility] ENDOSCOPY;  Service: Cardiovascular;  Laterality: N/A;   TEE WITHOUT CARDIOVERSION N/A 06/29/2016   Procedure: TRANSESOPHAGEAL ECHOCARDIOGRAM (TEE);  Surgeon: [name], MD;  Location: [place];  Service: Open Heart Surgery;  Laterality: N/A;   TEE WITHOUT CARDIOVERSION N/A 03/06/2019   Procedure: TRANSESOPHAGEAL ECHOCARDIOGRAM (TEE);  Surgeon: Larey Dresser, MD;  Location: [facility] ENDOSCOPY;  Service: Cardiovascular;  Laterality: N/A;   TRANSURETHRAL RESECTION OF PROSTATE N/A 05/25/2020   Procedure: TRANSURETHRAL RESECTION OF THE PROSTATE (TURP) WITH CYSTOLITHOLAPAXY;  Surgeon: Ceasar Mons, MD;  Location: [facility];  Service: Urology;  Laterality: N/A;   UMBILICAL HERNIA REPAIR  09-05-2012  @[facility]    Family History  Problem Relation Age of Onset   Diabetes Mother    Hypertension Mother    Heart failure Mother    Colon cancer Neg Hx    Esophageal cancer Neg Hx    Rectal cancer Neg Hx    Stomach cancer Neg  Hx    Social History:  reports that he has never smoked. He has never used smokeless tobacco. He reports previous alcohol use. He reports that he does not use drugs. Allergies  Allergen Reactions   Isosorbide     Hypotension    Prior to Admission medications   Medication Sig Start Date End Date Taking? Authorizing Provider  Albuterol Sulfate (PROAIR RESPICLICK) 161 (90 Base) MCG/ACT AEPB Inhale 2 puffs into the lungs every 6 (six) hours as needed (wheezing/shortness of breath).   Yes [provider]  atorvastatin (LIPITOR) 20 MG tablet Take 20 mg by mouth daily. 04/09/20  Yes [provider]  Budeson-Glycopyrrol-Formoterol (BREZTRI AEROSPHERE) 160-9-4.8 MCG/ACT AERO Inhale 2 puffs into the lungs 2 (two) times daily as needed (congestion/respiratory issues.).   Yes [provider]  calcitRIOL (ROCALTROL) 0.5 MCG capsule Take 0.5 mcg by mouth every Monday, Wednesday, and Friday. 04/28/20  Yes [provider]  calcium acetate (PHOSLO) 667 MG capsule Take 2,668 mg by mouth 3 (three) times daily with meals. 11/22/19  Yes [provider]  carvedilol (COREG) 12.5  MG tablet Take 12.5 mg by mouth daily at 6 (six) AM.   Yes [provider]  ciprofloxacin (CIPRO) 500 MG tablet Take 500 mg by mouth daily. Start date : 07/08/20 07/08/20  Yes [provider]  Febuxostat 80 MG TABS Take 80 mg by mouth daily.   Yes [provider]  fexofenadine (ALLEGRA) 180 MG tablet Take 180 mg by mouth in the morning.   Yes [provider]  fluocinonide ointment (LIDEX) 4.03 % Apply 1 application topically 2 (two) times daily as needed (eczema). (Hands) 04/03/20  Yes [provider]  fluticasone (FLONASE) 50 MCG/ACT nasal spray Place 1 spray into both nostrils daily as needed for allergies or rhinitis.    Yes [provider]  furosemide (LASIX) 80 MG tablet Take 80 mg by mouth daily. 06/30/20  Yes [provider]  lactulose  (CHRONULAC) 10 GM/15ML solution Take 10 g by mouth 2 (two) times daily as needed for mild constipation.   Yes [provider]  pimecrolimus (ELIDEL) 1 % cream Apply topically 2 (two) times daily. Patient taking differently: Apply 1 application topically 2 (two) times daily as needed (eczema). 02/03/20  Yes Valentina Shaggy, MD  polyethylene glycol (MIRALAX / GLYCOLAX) 17 g packet Take 17 g by mouth daily as needed.   Yes [provider]  sildenafil (REVATIO) 20 MG tablet Take 20 mg by mouth daily as needed (erectile dysfunction). 12/30/18  Yes [provider]  tacrolimus (PROTOPIC) 0.1 % ointment Apply 1 application topically 2 (two) times daily as needed (eczema). (eyelids/hands) 04/30/20  Yes [provider]  tamsulosin (FLOMAX) 0.4 MG CAPS capsule Take 0.4 mg by mouth in the morning.   Yes [provider]  VYVANSE 30 MG capsule Take 30 mg by mouth every morning. 06/21/19  Yes [provider]  phenazopyridine (PYRIDIUM) 200 MG tablet Take 1 tablet (200 mg total) by mouth 3 (three) times daily as needed (for pain with urination). Patient not taking: No sig reported 05/26/20 05/26/21  Ceasar Mons, MD   Current Facility-Administered Medications  Medication Dose Route Frequency Provider Last Rate Last Admin   0.9 %  sodium chloride infusion  250 mL Intravenous PRN Toy Baker, MD       acetaminophen (TYLENOL) tablet 650 mg  650 mg Oral Q6H PRN Toy Baker, MD       Or   acetaminophen (TYLENOL) suppository 650 mg  650 mg Rectal Q6H PRN Doutova, Anastassia, MD       albuterol (PROVENTIL) (2.5 MG/3ML) 0.083% nebulizer solution 2.5 mg  2.5 mg Inhalation Q6H PRN Doutova, Anastassia, MD       atorvastatin (LIPITOR) tablet 20 mg  20 mg Oral Daily Doutova, Anastassia, MD   20 mg at 07/13/20 0954   [START ON 07/14/2020] calcitRIOL (ROCALTROL) capsule 0.5 mcg  0.5 mcg Oral Q M,W,F Doutova, Anastassia, MD       calcium acetate  (PHOSLO) capsule 2,668 mg  2,668 mg Oral TID WC Doutova, Anastassia, MD   2,668 mg at 07/13/20 1707   cefTRIAXone (ROCEPHIN) 2 g in sodium chloride 0.9 % 100 mL IVPB  2 g Intravenous Q24H Nita Sells, MD       Chlorhexidine Gluconate Cloth 2 % PADS 6 each  6 each Topical Daily Nita Sells, MD   6 each at 07/13/20 0959   ciprofloxacin (CIPRO) tablet 500 mg  500 mg Oral Q breakfast Toy Baker, MD   500 mg at 07/13/20 0954   HYDROcodone-acetaminophen (  NORCO/VICODIN) 5-325 MG per tablet 1-2 tablet  1-2 tablet Oral Q4H PRN Toy Baker, MD       hydrOXYzine (ATARAX/VISTARIL) tablet 10 mg  10 mg Oral TID PRN Nita Sells, MD   10 mg at 07/13/20 1746   lidocaine (XYLOCAINE) 1 % (with pres) injection    PRN Allred, Darrell K, PA-C   10 mL at 07/13/20 1039   lisdexamfetamine (VYVANSE) capsule 30 mg  30 mg Oral q morning Doutova, Anastassia, MD   30 mg at 07/13/20 1200   metroNIDAZOLE (FLAGYL) IVPB 500 mg  500 mg Intravenous Q8H Samtani, Jai-Gurmukh, MD       polyethylene glycol (MIRALAX / GLYCOLAX) packet 17 g  17 g Oral Daily PRN Doutova, Anastassia, MD       sodium chloride flush (NS) 0.9 % injection 3 mL  3 mL Intravenous Q12H Doutova, Anastassia, MD   3 mL at 07/13/20 0957   sodium chloride flush (NS) 0.9 % injection 3 mL  3 mL Intravenous PRN Doutova, Anastassia, MD       tamsulosin (FLOMAX) capsule 0.4 mg  0.4 mg Oral q AM Doutova, Anastassia, MD   0.4 mg at 07/13/20 0957   Labs: Basic Metabolic Panel: Recent Labs  Lab 07/09/20 1016 07/12/20 2033 07/13/20 0421  NA 140 140 140  K 4.1 3.7 3.1*  CL 97* 95* 96*  CO2 32 33* 34*  GLUCOSE 87 82 82  BUN 34* 16 14  CREATININE 9.79* 7.13* 7.76*  CALCIUM 8.9 8.4* 8.3*  PHOS  --   --  3.3   Liver Function Tests: Recent Labs  Lab 07/12/20 2033 07/13/20 0421  AST 27 22  ALT 16 13  ALKPHOS 46 41  BILITOT 0.5 0.8  PROT 6.5 5.5*  ALBUMIN 3.5 2.9*   CBC: Recent Labs  Lab 07/09/20 1016 07/12/20 2033  07/13/20 0421  WBC 5.6 4.3 4.2  NEUTROABS 3.3 2.5 2.5  HGB 12.3* 12.3* 11.5*  HCT 40.5 39.9 36.7*  MCV 92.7 90.5 88.4  PLT 253 214 217    Studies/Results: CT PELVIS WO CONTRAST  Result Date: 07/12/2020 CLINICAL DATA:  55 year old male with bladder mass. Suprapubic catheter placed 3 days ago with minimal drainage. Evaluate for location the pigtail catheter. EXAM: CT PELVIS WITHOUT CONTRAST TECHNIQUE: Multidetector CT imaging of the pelvis was performed following the standard protocol without intravenous contrast. COMPARISON:  CT abdomen pelvis dated 01/22/2020. FINDINGS: Evaluation of this exam is limited in the absence of intravenous contrast. Urinary Tract: The urinary bladder is decompressed around a suprapubic catheter. The pigtail tip of the suprapubic catheter is located within the bladder lumen. Bowel: No bowel dilatation or evidence of obstruction. The appendix is normal. Vascular/Lymphatic: Minimal atherosclerotic calcification of the iliac arteries. Reproductive: The prostate and seminal vesicles are grossly unremarkable. Other: There is moderate ascites. As well as anasarca. There has been significant interval increase in the size of the status compared to the CT of 01/22/2020. Clinical correlation is recommended. Musculoskeletal: Degenerative changes primarily at L5-S1. No acute osseous pathology. IMPRESSION: 1. Suprapubic catheter with pigtail tip located within the bladder lumen. The bladder is completely decompressed around the catheter. 2. Moderate ascites, significantly increased since 01/22/2020. Clinical correlation is recommended. These results were called by telephone at the time of interpretation on 07/12/2020 at 8:34 pm to provider MATTHEW TRIFAN , who verbally acknowledged these results. Electronically Signed   By: Anner Crete M.D.   On: 07/12/2020 20:34   ECHOCARDIOGRAM COMPLETE  Result Date: 07/13/2020  ECHOCARDIOGRAM REPORT   Patient Name:   JACHAI OKAZAKI Date of  Exam: 07/13/2020 Medical Rec #:  [id number]       Height:       70.0 in Accession #:    [id number]      Weight:       175.9 lb Date of Birth:  12-25-1965        BSA:          1.977 m Patient Age:    37 years        BP:           108/86 mmHg Patient Gender: M               HR:           76 bpm. Exam Location:  Inpatient Procedure: 2D Echo, 3D Echo, Color Doppler, Cardiac Doppler and Strain Analysis Indications:    CHF-Acute Systolic I01.65  History:        Patient has prior history of Echocardiogram examinations, most                 recent 03/26/2019. CHF, Mitral Valve Disease and MR repair,                 MR; Risk Factors:Hypertension. CKD.                  Mitral Valve: 30 mm Ring Mitral Memo 3D 30MM valve is present in                 the mitral position. Procedure Date: 06/29/2016.  Sonographer:    Darlina Sicilian RDCS Referring Phys: [place]  1. Left ventricular ejection fraction, by estimation, is 50 to 55%. The left ventricle has low normal function. The left ventricle demonstrates global hypokinesis. The left ventricular internal cavity size was mildly dilated. Left ventricular diastolic function could not be evaluated.  2. Right ventricular systolic function is normal. The right ventricular size is normal.  3. The mitral valve has been repaired/replaced. Trivial mitral valve regurgitation. No evidence of mitral stenosis. The mean mitral valve gradient is 5.5 mmHg. There is a 30 mm Ring Mitral Memo 3D 30MM present in the mitral position. Procedure Date: 06/29/2016.  4. The aortic valve is normal in structure. Aortic valve regurgitation is not visualized. No aortic stenosis is present.  5. Aortic dilatation noted. There is dilatation of the aortic root, measuring 37 mm.  6. The inferior vena cava is normal in size with greater than 50% respiratory variability, suggesting right atrial pressure of 3 mmHg.  7. No significant change compated to TEE 2021 FINDINGS  Left Ventricle: Left  ventricular ejection fraction, by estimation, is 50 to 55%. The left ventricle has low normal function. The left ventricle demonstrates global hypokinesis. The left ventricular internal cavity size was mildly dilated. There is no left ventricular hypertrophy. Left ventricular diastolic function could not be evaluated due to mitral valve repair. Left ventricular diastolic function could not be evaluated. Right Ventricle: The right ventricular size is normal. No increase in right ventricular wall thickness. Right ventricular systolic function is normal. Left Atrium: Left atrial size was normal in size. Right Atrium: Right atrial size was normal in size. Pericardium: There is no evidence of pericardial effusion. Mitral Valve: The mitral valve has been repaired/replaced. There is moderate thickening of the mitral valve leaflet(s). Trivial mitral valve regurgitation. There is a 30 mm Ring Mitral Memo 3D 30MM present in the mitral  position. Procedure Date: 06/29/2016. No evidence of mitral valve stenosis. MV peak gradient, 10.2 mmHg. The mean mitral valve gradient is 5.5 mmHg. Tricuspid Valve: The tricuspid valve is normal in structure. Tricuspid valve regurgitation is not demonstrated. No evidence of tricuspid stenosis. Aortic Valve: The aortic valve is normal in structure. Aortic valve regurgitation is not visualized. No aortic stenosis is present. Pulmonic Valve: The pulmonic valve was normal in structure. Pulmonic valve regurgitation is not visualized. No evidence of pulmonic stenosis. Aorta: The aortic root is normal in size and structure and aortic dilatation noted. There is dilatation of the aortic root, measuring 37 mm. Venous: The inferior vena cava is normal in size with greater than 50% respiratory variability, suggesting right atrial pressure of 3 mmHg. IAS/Shunts: No atrial level shunt detected by color flow Doppler.  LEFT VENTRICLE PLAX 2D LVIDd:         5.50 cm  Diastology LVIDs:         4.40 cm  LV e'  medial:    5.11 cm/s LV PW:         1.10 cm  LV E/e' medial:  24.0 LV IVS:        1.00 cm  LV e' lateral:   9.67 cm/s LVOT diam:     2.50 cm  LV E/e' lateral: 12.7 LV SV:         75 LV SV Index:   38 LVOT Area:     4.91 cm  RIGHT VENTRICLE RV S prime:     10.90 cm/s TAPSE (M-mode): 1.6 cm LEFT ATRIUM             Index       RIGHT ATRIUM           Index LA diam:        4.00 cm 2.02 cm/m  RA Area:     14.30 cm LA Vol (A2C):   45.8 ml 23.17 ml/m RA Volume:   32.40 ml  16.39 ml/m LA Vol (A4C):   63.2 ml 31.97 ml/m LA Biplane Vol: 60.0 ml 30.36 ml/m  AORTIC VALVE LVOT Vmax:   95.80 cm/s LVOT Vmean:  63.750 cm/s LVOT VTI:    0.152 m  AORTA Ao Root diam: 3.70 cm Ao Asc diam:  3.10 cm MITRAL VALVE MV Area (PHT): 2.75 cm     SHUNTS MV Area VTI:   1.93 cm     Systemic VTI:  0.15 m MV Peak grad:  10.2 mmHg    Systemic Diam: 2.50 cm MV Mean grad:  5.5 mmHg MV Vmax:       1.60 m/s MV Vmean:      112.5 cm/s MV Decel Time: 276 msec MV E velocity: 122.67 cm/s MV A velocity: 163.67 cm/s MV E/A ratio:  0.75 Fransico Him MD Electronically signed by Fransico Him MD Signature Date/Time: 07/13/2020/2:14:04 PM    Final    CT CYSTOGRAM PELVIS  Result Date: 07/13/2020 CLINICAL DATA:  Abdominal distension. Suprapubic catheter is not drawn. Assess for possible bladder injury. EXAM: CT CYSTOGRAM (CT PELVIS WITH CONTRAST) TECHNIQUE: Multidetector CT imaging through the pelvis was performed after dilute contrast had been introduced into the bladder for the purposes of performing CT cystography. CONTRAST:  82mL OMNIPAQUE IOHEXOL 300 MG/ML  SOLN COMPARISON:  CT pelvis 07/12/2020 FINDINGS: Urinary Tract: There is contrast in the bladder. No evidence of extravasation/bladder leak. Moderate diffuse bladder wall thickening. The suprapubic catheter is in good position without complicating features. Surgical changes from recent  TURP with contrast extending down into the TURP defect. The prostate gland is otherwise unremarkable. The seminal  vesicles are normal. Bowel: The rectum, sigmoid colon and visualized small bowel loops unremarkable. Moderate free pelvic fluid is noted. Vascular/Lymphatic: Scattered vascular calcifications are stable. Reproductive:  TURP defect.  No other significant findings. Other:  Moderate lower abdominal and free pelvic fluid. Musculoskeletal: No significant bony findings. IMPRESSION: 1. Surgical changes from recent TURP with contrast extending down into the TURP defect. No evidence of extravasation/bladder leak. 2. Intact suprapubic catheter without complicating features. 3. Moderate lower abdominal and free pelvic fluid. Electronically Signed   By: Marijo Sanes M.D.   On: 07/13/2020 16:45   IR Paracentesis  Result Date: 07/13/2020 INDICATION: Patient with history of end-stage renal disease with prior peritoneal dialysis, hypertension, CHF, urethral stricture, BPH, recent suprapubic catheter placement, ascites. Request received for diagnostic and therapeutic paracentesis. EXAM: ULTRASOUND GUIDED DIAGNOSTIC AND THERAPEUTIC PARACENTESIS MEDICATIONS: 1% lidocaine to skin and subcutaneous tissue COMPLICATIONS: None immediate. PROCEDURE: Informed written consent was obtained from the patient after a discussion of the risks, benefits and alternatives to treatment. A timeout was performed prior to the initiation of the procedure. Initial ultrasound scanning demonstrates a large amount of ascites within the left lower abdominal quadrant. The left lower abdomen was prepped and draped in the usual sterile fashion. 1% lidocaine was used for local anesthesia. Following this, a 19 gauge, 10-cm, Yueh catheter was introduced. An ultrasound image was saved for documentation purposes. The paracentesis was performed. The catheter was removed and a dressing was applied. The patient tolerated the procedure well without immediate post procedural complication. FINDINGS: A total of approximately 6.7 liters of yellow fluid was removed.  Samples were sent to the laboratory as requested by the clinical team. IMPRESSION: Successful ultrasound-guided diagnostic and therapeutic paracentesis yielding 6.7 liters of peritoneal fluid. Read by: Rowe Robert, PA-C Electronically Signed   By: Corrie Mckusick D.O.   On: 07/13/2020 12:06    ROS: All others negative except those listed in HPI.  Physical Exam: Vitals:   07/13/20 0624 07/13/20 0910 07/13/20 1446 07/13/20 1706  BP: 111/84 108/86 109/80 125/90  Pulse: 77 76 80 81  Resp: 18 17 18 18   Temp: 98.5 F (36.9 C) 98.6 F (37 C)  98.5 F (36.9 C)  TempSrc: Oral Oral  Oral  SpO2: 97% 96%  98%  Weight:      Height:         General: well appearing male in NAD Head: NCAT sclera not icteric MMM Neck: Supple. No lymphadenopathy Lungs: CTA bilaterally. No wheeze, rales or rhonchi. Breathing is unlabored. Heart: RRR. No murmur, rubs or gallops.  Abdomen: soft, nontender, +BS, no guarding, no rebound tenderness GU: suprapubic catheter in place with ~10cc blood tinged urine  Lower extremities:no edema, ischemic changes, or open wounds  Neuro: AAOx3. Moves all extremities spontaneously. Psych:  Responds to questions appropriately with a normal affect. Dialysis Access: LU AVF +b/t  Dialysis Orders:  MWF - [facility]  4hrs, BFR 450, DFR 500,  EDW 81kg, 2K/ 2Ca  Access: LU AVF  Heparin 3000 Mircera 75 mcg q2wks - last 159mcg on 6/27 Venofer 100mg  qHD x10 (8 completed) Hectorol 48mcg IV qHD    Assessment/Plan:  Ascites - etiology unclear.  Seen on CT.  S/p paracentesis yielding 6.7L yellow fluid. Cytology pending.  Urinary retention - s/p suprapubic catheter placed on 7/8 by IR.  Catheter appears in good position with no bladder injury noted on cystogram.  ESRD -  On HD MWF.  Plan for HD tomorrow per regular schedule.   Hypertension/volume  - Blood pressure well controlled.  [place] noted on CT.  Per patient likely having weight loss.  Slightly under EDW as outpatient. Will need  dry weight lowered on d/c.  Obtain standing weight for improved accuracy, UF as tolerated.   Anemia of CKD - Hgb 11.5. no indication for ESA at this time.   Secondary Hyperparathyroidism -  Ca and phos in goal.  Continue VDRA and binders.   Nutrition - Renal diet w/fluid restrictions. NCIM, systolic HF w/EF 82%  Jen Mow, PA-C [employer] 07/13/2020, 7:19 PM

## 2020-07-13 NOTE — Procedures (Signed)
Ultrasound-guided diagnostic and therapeutic paracentesis performed yielding 6.7 liters of yellow fluid. No immediate complications. A portion of the fluid was sent to the lab for preordered studies. EBL none.

## 2020-07-13 NOTE — Progress Notes (Signed)
  Echocardiogram 2D Echocardiogram with 3D and strain has been performed.  Darlina Sicilian M 07/13/2020, 1:49 PM

## 2020-07-14 ENCOUNTER — Other Ambulatory Visit (HOSPITAL_COMMUNITY): Payer: Medicare Other

## 2020-07-14 LAB — CBC WITH DIFFERENTIAL/PLATELET
Abs Immature Granulocytes: 0.01 10*3/uL (ref 0.00–0.07)
Basophils Absolute: 0.1 10*3/uL (ref 0.0–0.1)
Basophils Relative: 1 %
Eosinophils Absolute: 0.4 10*3/uL (ref 0.0–0.5)
Eosinophils Relative: 8 %
HCT: 30.9 % — ABNORMAL LOW (ref 39.0–52.0)
Hemoglobin: 9.7 g/dL — ABNORMAL LOW (ref 13.0–17.0)
Immature Granulocytes: 0 %
Lymphocytes Relative: 26 %
Lymphs Abs: 1.2 10*3/uL (ref 0.7–4.0)
MCH: 27.4 pg (ref 26.0–34.0)
MCHC: 31.4 g/dL (ref 30.0–36.0)
MCV: 87.3 fL (ref 80.0–100.0)
Monocytes Absolute: 0.5 10*3/uL (ref 0.1–1.0)
Monocytes Relative: 10 %
Neutro Abs: 2.5 10*3/uL (ref 1.7–7.7)
Neutrophils Relative %: 55 %
Platelets: 211 10*3/uL (ref 150–400)
RBC: 3.54 MIL/uL — ABNORMAL LOW (ref 4.22–5.81)
RDW: 15.8 % — ABNORMAL HIGH (ref 11.5–15.5)
WBC: 4.5 10*3/uL (ref 4.0–10.5)
nRBC: 0 % (ref 0.0–0.2)

## 2020-07-14 LAB — RENAL FUNCTION PANEL
Albumin: 2.8 g/dL — ABNORMAL LOW (ref 3.5–5.0)
Anion gap: 6 (ref 5–15)
BUN: 21 mg/dL — ABNORMAL HIGH (ref 6–20)
CO2: 35 mmol/L — ABNORMAL HIGH (ref 22–32)
Calcium: 8.3 mg/dL — ABNORMAL LOW (ref 8.9–10.3)
Chloride: 98 mmol/L (ref 98–111)
Creatinine, Ser: 9.68 mg/dL — ABNORMAL HIGH (ref 0.61–1.24)
GFR, Estimated: 6 mL/min — ABNORMAL LOW (ref >60)
Glucose, Bld: 84 mg/dL (ref 70–99)
Phosphorus: 3.9 mg/dL (ref 2.5–4.6)
Potassium: 3.4 mmol/L — ABNORMAL LOW (ref 3.5–5.1)
Sodium: 139 mmol/L (ref 135–145)

## 2020-07-14 LAB — GRAM STAIN

## 2020-07-14 LAB — CYTOLOGY - NON PAP

## 2020-07-14 MED ORDER — HEPARIN SODIUM (PORCINE) 1000 UNIT/ML IJ SOLN
INTRAMUSCULAR | Status: AC
  Filled 2020-07-14: qty 3

## 2020-07-14 MED ORDER — SODIUM CHLORIDE 0.9 % IV SOLN: 100.0000 mL | INTRAVENOUS | Status: DC | PRN

## 2020-07-14 MED ORDER — HEPARIN SODIUM (PORCINE) 1000 UNIT/ML DIALYSIS: 1000.0000 [IU] | INTRAMUSCULAR | Status: DC | PRN

## 2020-07-14 MED ORDER — LIDOCAINE-PRILOCAINE 2.5-2.5 % EX CREA: 1.0000 "application " | TOPICAL_CREAM | CUTANEOUS | Status: DC | PRN

## 2020-07-14 MED ORDER — NEPRO/CARBSTEADY PO LIQD
237.0000 mL | Freq: Two times a day (BID) | ORAL | Status: DC
  Administered 2020-07-14: 237 mL via ORAL

## 2020-07-14 MED ORDER — PENTAFLUOROPROP-TETRAFLUOROETH EX AERO: 1.0000 "application " | INHALATION_SPRAY | CUTANEOUS | Status: DC | PRN

## 2020-07-14 MED ORDER — ALTEPLASE 2 MG IJ SOLR: 2.0000 mg | Freq: Once | INTRAMUSCULAR | Status: DC | PRN

## 2020-07-14 MED ORDER — HEPARIN SODIUM (PORCINE) 1000 UNIT/ML DIALYSIS: 3000.0000 [IU] | INTRAMUSCULAR | Status: DC | PRN

## 2020-07-14 MED ORDER — RENA-VITE PO TABS
1.0000 | ORAL_TABLET | Freq: Every day | ORAL | Status: DC
  Administered 2020-07-14: 1 via ORAL
  Filled 2020-07-14: qty 1

## 2020-07-14 MED ORDER — LIDOCAINE HCL (PF) 1 % IJ SOLN: 5.0000 mL | INTRAMUSCULAR | Status: DC | PRN

## 2020-07-14 NOTE — Progress Notes (Signed)
[facility] Progress Note  Subjective: pt seen on HD, no c/o today, hoping to go home soon  Vitals:   07/14/20 1255 07/14/20 1300 07/14/20 1330 07/14/20 1400  BP: 120/84 117/84 118/82 121/84  Pulse: 83 80 81   Resp:    19  Temp:      TempSrc:      SpO2:      Weight:      Height:        Exam:  alert, nad   no jvd  Chest cta bilat  Cor reg no RG  Abd soft ntnd no ascites   Ext no LE edema   Alert, NF, ox3   LUA AVF+ b     OP HD: MWF SW  4h  450/500  81kg  2/2 bath  LUA AVF  Hep 3000  Mircera 75 mcg q2wks - last 137mcg on 6/27  Venofer 100mg  qHD x10 (8 completed)  Hectorol 1mcg IV qHD     Assessment/ Plan: Ascites - etiology unclear.  Seen on CT.  S/p paracentesis yielding 6.7L yellow fluid. No hx liver disease. Cytology pending. Per primary team, w/u in progress.  Urinary retention - s/p suprapubic catheter placed on 7/8 by IR. Had TURP done in late May 2022. Current SP cath appears in good position with no bladder injury noted on cystogram.  ESRD -  On HD MWF.  Plan for HD today.   BP/volume  - Blood pressures wnl, not on any BP lowering meds. 7kg under his dry wt, will need lowering upon dc. Pt says he lost his appetite.    Anemia of CKD - Hgb 11.5. no indication for ESA at this time.   Secondary Hyperparathyroidism -  Ca and phos in goal.  Continue VDRA and binders.  Nutrition - Renal diet w/fluid restrictions. NCIM, systolic HF w/EF 50%     Rob Chauncey Sciulli 07/14/2020, 2:13 PM   Recent Labs  Lab 07/13/20 0421 07/14/20 0303  K 3.1* 3.4*  BUN 14 21*  CREATININE 7.76* 9.68*  CALCIUM 8.3* 8.3*  PHOS 3.3 3.9  HGB 11.5* 9.7*   Inpatient medications:  atorvastatin  20 mg Oral Daily   calcitRIOL  0.5 mcg Oral Q M,W,F   calcium acetate  2,668 mg Oral TID WC   Chlorhexidine Gluconate Cloth  6 each Topical Daily   feeding supplement (NEPRO CARB STEADY)  237 mL Oral BID BM   heparin sodium (porcine)       lisdexamfetamine  30 mg Oral q morning    multivitamin  1 tablet Oral QHS   sodium chloride flush  3 mL Intravenous Q12H   tamsulosin  0.4 mg Oral q AM    sodium chloride     sodium chloride     sodium chloride     cefTRIAXone (ROCEPHIN)  IV 2 g (07/13/20 2231)   metronidazole 500 mg (07/14/20 0416)   sodium chloride, sodium chloride, sodium chloride, acetaminophen **OR** acetaminophen, albuterol, alteplase, heparin, heparin, HYDROcodone-acetaminophen, hydrOXYzine, lidocaine (PF), lidocaine, lidocaine-prilocaine, pentafluoroprop-tetrafluoroeth, polyethylene glycol, sodium chloride flush

## 2020-07-14 NOTE — Progress Notes (Signed)
Initial Nutrition Assessment  DOCUMENTATION CODES:   Not applicable  INTERVENTION:  -Nepro Shake po BID, each supplement provides 425 kcal and 19 grams protein -renal mvi daily   NUTRITION DIAGNOSIS:   Increased nutrient needs related to chronic illness (ESRD on HD) as evidenced by estimated needs.  GOAL:   Patient will meet greater than or equal to 90% of their needs  MONITOR:   PO intake, Supplement acceptance, Weight trends, Labs, I & O's  REASON FOR ASSESSMENT:   Consult Assessment of nutrition requirement/status  ASSESSMENT:   Pt with PMH significant for ESRD on HD and on transplant list, HTN, NICM, h/o CVA, BPH s/p TURP, CHF, uretheral stricture s/p suprapubic catheter, and asthma admitted with ascites Ddx bladder injury causing uroperitoneum?; CT cystogram without extravasation bladder leak; Urology following.  Pt unavailable at time of RD visit, in HD. Discussed with RN.   PO Intake: 100% x 2 recorded meals  EDW 81 kg Current weight: 74.5 kg  Admit weight: 80.8 kg  Pt will need new EDW. Given weight loss and poor appetite, suspect pt meets criteria for malnutrition; however, unable to diagnose at this time without a nutrition-focused physical exam. Will attempt at follow-up.   UOP: 462ml x 24 hours  Medications: rocaltrol, phoslo Labs: K+ 3.4 (L), Cr 9.68 (H)  Diet Order:   Diet Order             Diet renal with fluid restriction Fluid restriction: 1200 mL Fluid; Room service appropriate? Yes; Fluid consistency: Thin  Diet effective now                   EDUCATION NEEDS:   No education needs have been identified at this time  Skin:  Skin Assessment: Reviewed RN Assessment  Last BM:  7/12  Height:   Ht Readings from Last 1 Encounters:  07/13/20 5\' 10"  (1.778 m)    Weight:   Wt Readings from Last 1 Encounters:  07/14/20 74.5 kg     BMI:  Body mass index is 23.57 kg/m.  Estimated Nutritional Needs:   Kcal:   2400-2600  Protein:  120-130 grams  Fluid:  1L+UOP    Larkin Ina, MS, RD, LDN (she/her/hers) RD pager number and weekend/on-call pager number located in [place].

## 2020-07-14 NOTE — Progress Notes (Addendum)
PROGRESS NOTE    Vernon Blackburn  PXT:[id number] DOB: 01-18-1965 DOA: 07/12/2020 PCP: [name], MD   Brief Narrative:  67 male community dwelling; BPH + LUTS status post TURP 05/25/2020 Dr. Lovena Neighbours; ESRD MWF - Transplant-followed at [facility]. Chronic nonischemic cardiomyopathy EF --tachypalpitations seen 06/24/2020 and the plan was for Zio patch with heart failure team 20-25%--HF improved EF 45% 2021 Suprapubic catheter placed by IR 07/09/2020- Prior intermediate probability PE 2018+?  GI bleed. Mitral valve annuloplasty 06/29/2016 Dr. Roxy Manns Prior CVA. Seen in cardiology office 06/24/2020 tachycardia palpitations near syncope-had heart rate in the 140s and felt like he was going to pass out. On eval-found to be in sinus rhythm blood pressure stable. Tells me he went to an urgent care or ED end of June they attempted--he was having retention of urine, they attempted to catheterize him and apparently they were "rough" and they could not get it in--he had a supra Pubic catheter placed on 7/7 by IR. Poor suprapubic catheter drainage called his urologist and came to the ED at their request. Work-up in ED = ascites on CT scan  Assessment & Plan:   Active Problems:   Anemia   Chronic systolic HF (heart failure) (HCC)   S/P minimally invasive mitral valve repair   ESRD on dialysis ([facility])   Ascites   Essential hypertension   Asthma  Ascites of unclear etiology Bladder injury ruled out with imaging and urology discussion - uroperitoneum ruled out - will confirm with ascites labs Work-up ascites pending---ascitic neutrophil count low (5) SAAG 0.6 - not consistent with portal HTN Rule out volume overload given ESRD - concern for noncompliance with diet/dialysis  Echo 50-55% with global hypokinesis - volume management with dialysis Cultures pending - SBP unlikely given lack of symptoms - cover with ceftriaxone Flagyl until cultures finalize  ESRD MWF on transplant list [facility] Renal following - appreciate  insight/recs Holding Lasix 80 daily at this time  Chronic nonischemic cardiomyopathy Tachycardia  Palpitations/tachy resolving - continues wearing Zio patch  HFrEF - EF 50-55% as above - Cannot rule out acute exacerbation given above (although less likely given lack of peripheral edema/JVD)  Severe MR status post annuloplasty 06/21/2016 - Echo as above  Anxiety - Continue Vynase 30 a.m., added Atarax 10 3 times daily as needed  Prior indeterminant PE - No longer on anticoagulation   DVT prophylaxis: SCD Code Status: Full Family Communication: None present  Status is: Inpatient  Dispo: The patient is from: Home              Anticipated d/c is to: Same              Anticipated d/c date is: 48-72H pending workup              Patient currently NOT medically stable for discharge  Consultants:  Nephrology, Urology  Procedures:  None planned  Antimicrobials:  Cefepime/flagyl as above   Subjective: No acute issues/events overnight - denies nausea vomiting diarrhea headache fevers chills or chest pain  Objective: Vitals:   07/13/20 1446 07/13/20 1706 07/13/20 2050 07/14/20 0426  BP: 109/80 125/90 111/79 100/86  Pulse: 80 81 82 75  Resp: 18 18 18 18   Temp:  98.5 F (36.9 C) 98.2 F (36.8 C) 98.1 F (36.7 C)  TempSrc:  Oral Oral Oral  SpO2:  98% 98% 94%  Weight:   79.8 kg   Height:        Intake/Output Summary (Last 24 hours) at 07/14/2020  0745 Last data filed at 07/14/2020 0416 Gross per 24 hour  Intake 1120 ml  Output 412 ml  Net 708 ml   Filed Weights   07/12/20 1641 07/13/20 0242 07/13/20 2050  Weight: 80.8 kg 79.8 kg 79.8 kg    Examination:  General exam: Appears calm and comfortable  Respiratory system: Clear to auscultation. Respiratory effort normal. Cardiovascular system: S1 & S2 heard, RRR. No JVD, murmurs, rubs, gallops or clicks. No pedal edema. Gastrointestinal system: Abdomen is nondistended, soft and nontender. Suprapubic catheter in  tact. Central nervous system: Alert and oriented. No focal neurological deficits. Extremities: Symmetric 5 x 5 power. Skin: No rashes, lesions or ulcers Psychiatry: Judgement and insight appear normal. Mood & affect appropriate.   Data Reviewed: I have personally reviewed following labs and imaging studies  CBC: Recent Labs  Lab 07/09/20 1016 07/12/20 2033 07/13/20 0421 07/14/20 0303  WBC 5.6 4.3 4.2 4.5  NEUTROABS 3.3 2.5 2.5 2.5  HGB 12.3* 12.3* 11.5* 9.7*  HCT 40.5 39.9 36.7* 30.9*  MCV 92.7 90.5 88.4 87.3  PLT 253 214 217 694   Basic Metabolic Panel: Recent Labs  Lab 07/09/20 1016 07/12/20 2033 07/13/20 0421 07/14/20 0303  NA 140 140 140 139  K 4.1 3.7 3.1* 3.4*  CL 97* 95* 96* 98  CO2 32 33* 34* 35*  GLUCOSE 87 82 82 84  BUN 34* 16 14 21*  CREATININE 9.79* 7.13* 7.76* 9.68*  CALCIUM 8.9 8.4* 8.3* 8.3*  MG  --   --  2.4  --   PHOS  --   --  3.3 3.9   GFR: Estimated Creatinine Clearance: 9 mL/min (A) (by C-G formula based on SCr of 9.68 mg/dL (H)). Liver Function Tests: Recent Labs  Lab 07/12/20 2033 07/13/20 0421 07/14/20 0303  AST 27 22  --   ALT 16 13  --   ALKPHOS 46 41  --   BILITOT 0.5 0.8  --   PROT 6.5 5.5*  --   ALBUMIN 3.5 2.9* 2.8*   No results for input(s): LIPASE, AMYLASE in the last 168 hours. No results for input(s): AMMONIA in the last 168 hours. Coagulation Profile: Recent Labs  Lab 07/12/20 2033  INR 1.1   Cardiac Enzymes: No results for input(s): CKTOTAL, CKMB, CKMBINDEX, TROPONINI in the last 168 hours. BNP (last 3 results) No results for input(s): PROBNP in the last 8760 hours. HbA1C: No results for input(s): HGBA1C in the last 72 hours. CBG: No results for input(s): GLUCAP in the last 168 hours. Lipid Profile: No results for input(s): CHOL, HDL, LDLCALC, TRIG, CHOLHDL, LDLDIRECT in the last 72 hours. Thyroid Function Tests: Recent Labs    07/13/20 0421  TSH 1.379   Anemia Panel: No results for input(s):  VITAMINB12, FOLATE, FERRITIN, TIBC, IRON, RETICCTPCT in the last 72 hours. Sepsis Labs: No results for input(s): PROCALCITON, LATICACIDVEN in the last 168 hours.  Recent Results (from the past 240 hour(s))  Resp Panel by RT-PCR (Flu A&B, Covid) Nasopharyngeal Swab     Status: None   Collection Time: 07/12/20 10:20 PM   Specimen: Nasopharyngeal Swab; Nasopharyngeal(NP) swabs in vial transport medium  Result Value Ref Range Status   SARS Coronavirus 2 by RT PCR NEGATIVE NEGATIVE Final    Comment: (NOTE) SARS-CoV-2 target nucleic acids are NOT DETECTED.  The SARS-CoV-2 RNA is generally detectable in upper respiratory specimens during the acute phase of infection. The lowest concentration of SARS-CoV-2 viral copies this assay can detect is 138 copies/mL. A  negative result does not preclude SARS-Cov-2 infection and should not be used as the sole basis for treatment or other patient management decisions. A negative result may occur with  improper specimen collection/handling, submission of specimen other than nasopharyngeal swab, presence of viral mutation(s) within the areas targeted by this assay, and inadequate number of viral copies(<138 copies/mL). A negative result must be combined with clinical observations, patient history, and epidemiological information. The expected result is Negative.  Fact Sheet for Patients:  EntrepreneurPulse.com.au  Fact Sheet for Healthcare Providers:  IncredibleEmployment.be  This test is no t yet approved or cleared by the [place] FDA and  has been authorized for detection and/or diagnosis of SARS-CoV-2 by FDA under an Emergency Use Authorization (EUA). This EUA will remain  in effect (meaning this test can be used) for the duration of the COVID-19 declaration under Section 564(b)(1) of the Act, 21 U.S.C.section 360bbb-3(b)(1), unless the authorization is terminated  or revoked sooner.       Influenza A  by PCR NEGATIVE NEGATIVE Final   Influenza B by PCR NEGATIVE NEGATIVE Final    Comment: (NOTE) The Xpert Xpress SARS-CoV-2/FLU/RSV plus assay is intended as an aid in the diagnosis of influenza from Nasopharyngeal swab specimens and should not be used as a sole basis for treatment. Nasal washings and aspirates are unacceptable for Xpert Xpress SARS-CoV-2/FLU/RSV testing.  Fact Sheet for Patients: EntrepreneurPulse.com.au  Fact Sheet for Healthcare Providers: IncredibleEmployment.be  This test is not yet approved or cleared by the [place] FDA and has been authorized for detection and/or diagnosis of SARS-CoV-2 by FDA under an Emergency Use Authorization (EUA). This EUA will remain in effect (meaning this test can be used) for the duration of the COVID-19 declaration under Section 564(b)(1) of the Act, 21 U.S.C. section 360bbb-3(b)(1), unless the authorization is terminated or revoked.  Performed at [facility], [place] [address] [place], [place] [address]   MRSA Next Gen by PCR, Nasal     Status: None   Collection Time: 07/13/20  3:09 AM   Specimen: Nasal Mucosa; Nasal Swab  Result Value Ref Range Status   MRSA by PCR Next Gen NOT DETECTED NOT DETECTED Final    Comment: (NOTE) The GeneXpert MRSA Assay (FDA approved for NASAL specimens only), is one component of a comprehensive MRSA colonization surveillance program. It is not intended to diagnose MRSA infection nor to guide or monitor treatment for MRSA infections. Test performance is not FDA approved in patients less than 42 years old. Performed at [facility], [place] [address], [place], [place] [address]          Radiology Studies: CT PELVIS WO CONTRAST  Result Date: 07/12/2020 CLINICAL DATA:  55 year old male with bladder mass. Suprapubic catheter placed 3 days ago with minimal drainage. Evaluate for location the pigtail catheter. EXAM: CT PELVIS  WITHOUT CONTRAST TECHNIQUE: Multidetector CT imaging of the pelvis was performed following the standard protocol without intravenous contrast. COMPARISON:  CT abdomen pelvis dated 01/22/2020. FINDINGS: Evaluation of this exam is limited in the absence of intravenous contrast. Urinary Tract: The urinary bladder is decompressed around a suprapubic catheter. The pigtail tip of the suprapubic catheter is located within the bladder lumen. Bowel: No bowel dilatation or evidence of obstruction. The appendix is normal. Vascular/Lymphatic: Minimal atherosclerotic calcification of the iliac arteries. Reproductive: The prostate and seminal vesicles are grossly unremarkable. Other: There is moderate ascites. As well as anasarca. There has been significant interval increase in the size of  the status compared to the CT of 01/22/2020. Clinical correlation is recommended. Musculoskeletal: Degenerative changes primarily at L5-S1. No acute osseous pathology. IMPRESSION: 1. Suprapubic catheter with pigtail tip located within the bladder lumen. The bladder is completely decompressed around the catheter. 2. Moderate ascites, significantly increased since 01/22/2020. Clinical correlation is recommended. These results were called by telephone at the time of interpretation on 07/12/2020 at 8:34 pm to provider MATTHEW TRIFAN , who verbally acknowledged these results. Electronically Signed   By: Anner Crete M.D.   On: 07/12/2020 20:34   ECHOCARDIOGRAM COMPLETE  Result Date: 07/13/2020    ECHOCARDIOGRAM REPORT   Patient Name:   Vernon Blackburn Date of Exam: 07/13/2020 Medical Rec #:  [id number]       Height:       70.0 in Accession #:    [id number]      Weight:       175.9 lb Date of Birth:  May 28, 1965        BSA:          1.977 m Patient Age:    55 years        BP:           108/86 mmHg Patient Gender: M               HR:           76 bpm. Exam Location:  Inpatient Procedure: 2D Echo, 3D Echo, Color Doppler, Cardiac Doppler and Strain  Analysis Indications:    CHF-Acute Systolic L89.21  History:        Patient has prior history of Echocardiogram examinations, most                 recent 03/26/2019. CHF, Mitral Valve Disease and MR repair,                 MR; Risk Factors:Hypertension. CKD.                  Mitral Valve: 30 mm Ring Mitral Memo 3D 30MM valve is present in                 the mitral position. Procedure Date: 06/29/2016.  Sonographer:    Darlina Sicilian RDCS Referring Phys: [place]  1. Left ventricular ejection fraction, by estimation, is 50 to 55%. The left ventricle has low normal function. The left ventricle demonstrates global hypokinesis. The left ventricular internal cavity size was mildly dilated. Left ventricular diastolic function could not be evaluated.  2. Right ventricular systolic function is normal. The right ventricular size is normal.  3. The mitral valve has been repaired/replaced. Trivial mitral valve regurgitation. No evidence of mitral stenosis. The mean mitral valve gradient is 5.5 mmHg. There is a 30 mm Ring Mitral Memo 3D 30MM present in the mitral position. Procedure Date: 06/29/2016.  4. The aortic valve is normal in structure. Aortic valve regurgitation is not visualized. No aortic stenosis is present.  5. Aortic dilatation noted. There is dilatation of the aortic root, measuring 37 mm.  6. The inferior vena cava is normal in size with greater than 50% respiratory variability, suggesting right atrial pressure of 3 mmHg.  7. No significant change compated to TEE 2021 FINDINGS  Left Ventricle: Left ventricular ejection fraction, by estimation, is 50 to 55%. The left ventricle has low normal function. The left ventricle demonstrates global hypokinesis. The left ventricular internal cavity size was mildly dilated. There is no left ventricular hypertrophy.  Left ventricular diastolic function could not be evaluated due to mitral valve repair. Left ventricular diastolic function could not  be evaluated. Right Ventricle: The right ventricular size is normal. No increase in right ventricular wall thickness. Right ventricular systolic function is normal. Left Atrium: Left atrial size was normal in size. Right Atrium: Right atrial size was normal in size. Pericardium: There is no evidence of pericardial effusion. Mitral Valve: The mitral valve has been repaired/replaced. There is moderate thickening of the mitral valve leaflet(s). Trivial mitral valve regurgitation. There is a 30 mm Ring Mitral Memo 3D 30MM present in the mitral position. Procedure Date: 06/29/2016. No evidence of mitral valve stenosis. MV peak gradient, 10.2 mmHg. The mean mitral valve gradient is 5.5 mmHg. Tricuspid Valve: The tricuspid valve is normal in structure. Tricuspid valve regurgitation is not demonstrated. No evidence of tricuspid stenosis. Aortic Valve: The aortic valve is normal in structure. Aortic valve regurgitation is not visualized. No aortic stenosis is present. Pulmonic Valve: The pulmonic valve was normal in structure. Pulmonic valve regurgitation is not visualized. No evidence of pulmonic stenosis. Aorta: The aortic root is normal in size and structure and aortic dilatation noted. There is dilatation of the aortic root, measuring 37 mm. Venous: The inferior vena cava is normal in size with greater than 50% respiratory variability, suggesting right atrial pressure of 3 mmHg. IAS/Shunts: No atrial level shunt detected by color flow Doppler.  LEFT VENTRICLE PLAX 2D LVIDd:         5.50 cm  Diastology LVIDs:         4.40 cm  LV e' medial:    5.11 cm/s LV PW:         1.10 cm  LV E/e' medial:  24.0 LV IVS:        1.00 cm  LV e' lateral:   9.67 cm/s LVOT diam:     2.50 cm  LV E/e' lateral: 12.7 LV SV:         75 LV SV Index:   38 LVOT Area:     4.91 cm  RIGHT VENTRICLE RV S prime:     10.90 cm/s TAPSE (M-mode): 1.6 cm LEFT ATRIUM             Index       RIGHT ATRIUM           Index LA diam:        4.00 cm 2.02 cm/m  RA  Area:     14.30 cm LA Vol (A2C):   45.8 ml 23.17 ml/m RA Volume:   32.40 ml  16.39 ml/m LA Vol (A4C):   63.2 ml 31.97 ml/m LA Biplane Vol: 60.0 ml 30.36 ml/m  AORTIC VALVE LVOT Vmax:   95.80 cm/s LVOT Vmean:  63.750 cm/s LVOT VTI:    0.152 m  AORTA Ao Root diam: 3.70 cm Ao Asc diam:  3.10 cm MITRAL VALVE MV Area (PHT): 2.75 cm     SHUNTS MV Area VTI:   1.93 cm     Systemic VTI:  0.15 m MV Peak grad:  10.2 mmHg    Systemic Diam: 2.50 cm MV Mean grad:  5.5 mmHg MV Vmax:       1.60 m/s MV Vmean:      112.5 cm/s MV Decel Time: 276 msec MV E velocity: 122.67 cm/s MV A velocity: 163.67 cm/s MV E/A ratio:  0.75 Fransico Him MD Electronically signed by Fransico Him MD Signature Date/Time: 07/13/2020/2:14:04 PM    Final  CT CYSTOGRAM PELVIS  Result Date: 07/13/2020 CLINICAL DATA:  Abdominal distension. Suprapubic catheter is not drawn. Assess for possible bladder injury. EXAM: CT CYSTOGRAM (CT PELVIS WITH CONTRAST) TECHNIQUE: Multidetector CT imaging through the pelvis was performed after dilute contrast had been introduced into the bladder for the purposes of performing CT cystography. CONTRAST:  74mL OMNIPAQUE IOHEXOL 300 MG/ML  SOLN COMPARISON:  CT pelvis 07/12/2020 FINDINGS: Urinary Tract: There is contrast in the bladder. No evidence of extravasation/bladder leak. Moderate diffuse bladder wall thickening. The suprapubic catheter is in good position without complicating features. Surgical changes from recent TURP with contrast extending down into the TURP defect. The prostate gland is otherwise unremarkable. The seminal vesicles are normal. Bowel: The rectum, sigmoid colon and visualized small bowel loops unremarkable. Moderate free pelvic fluid is noted. Vascular/Lymphatic: Scattered vascular calcifications are stable. Reproductive:  TURP defect.  No other significant findings. Other:  Moderate lower abdominal and free pelvic fluid. Musculoskeletal: No significant bony findings. IMPRESSION: 1. Surgical  changes from recent TURP with contrast extending down into the TURP defect. No evidence of extravasation/bladder leak. 2. Intact suprapubic catheter without complicating features. 3. Moderate lower abdominal and free pelvic fluid. Electronically Signed   By: Marijo Sanes M.D.   On: 07/13/2020 16:45   IR Paracentesis  Result Date: 07/13/2020 INDICATION: Patient with history of end-stage renal disease with prior peritoneal dialysis, hypertension, CHF, urethral stricture, BPH, recent suprapubic catheter placement, ascites. Request received for diagnostic and therapeutic paracentesis. EXAM: ULTRASOUND GUIDED DIAGNOSTIC AND THERAPEUTIC PARACENTESIS MEDICATIONS: 1% lidocaine to skin and subcutaneous tissue COMPLICATIONS: None immediate. PROCEDURE: Informed written consent was obtained from the patient after a discussion of the risks, benefits and alternatives to treatment. A timeout was performed prior to the initiation of the procedure. Initial ultrasound scanning demonstrates a large amount of ascites within the left lower abdominal quadrant. The left lower abdomen was prepped and draped in the usual sterile fashion. 1% lidocaine was used for local anesthesia. Following this, a 19 gauge, 10-cm, Yueh catheter was introduced. An ultrasound image was saved for documentation purposes. The paracentesis was performed. The catheter was removed and a dressing was applied. The patient tolerated the procedure well without immediate post procedural complication. FINDINGS: A total of approximately 6.7 liters of yellow fluid was removed. Samples were sent to the laboratory as requested by the clinical team. IMPRESSION: Successful ultrasound-guided diagnostic and therapeutic paracentesis yielding 6.7 liters of peritoneal fluid. Read by: Rowe Robert, PA-C Electronically Signed   By: Corrie Mckusick D.O.   On: 07/13/2020 12:06     Scheduled Meds:  atorvastatin  20 mg Oral Daily   calcitRIOL  0.5 mcg Oral Q M,W,F   calcium  acetate  2,668 mg Oral TID WC   Chlorhexidine Gluconate Cloth  6 each Topical Daily   ciprofloxacin  500 mg Oral Q breakfast   lisdexamfetamine  30 mg Oral q morning   sodium chloride flush  3 mL Intravenous Q12H   tamsulosin  0.4 mg Oral q AM   Continuous Infusions:  sodium chloride     cefTRIAXone (ROCEPHIN)  IV 2 g (07/13/20 2231)   metronidazole 500 mg (07/14/20 0416)     LOS: 2 days   Time spent: 14min  Suriyah Vergara C Rosely Fernandez, DO [facility]  If 7PM-7AM, please contact night-coverage [email or web address]  07/14/2020, 7:45 AM

## 2020-07-15 LAB — BASIC METABOLIC PANEL
Anion gap: 7 (ref 5–15)
BUN: 13 mg/dL (ref 6–20)
CO2: 33 mmol/L — ABNORMAL HIGH (ref 22–32)
Calcium: 8.5 mg/dL — ABNORMAL LOW (ref 8.9–10.3)
Chloride: 101 mmol/L (ref 98–111)
Creatinine, Ser: 7.26 mg/dL — ABNORMAL HIGH (ref 0.61–1.24)
GFR, Estimated: 8 mL/min — ABNORMAL LOW (ref >60)
Glucose, Bld: 76 mg/dL (ref 70–99)
Potassium: 3.8 mmol/L (ref 3.5–5.1)
Sodium: 141 mmol/L (ref 135–145)

## 2020-07-15 LAB — PATHOLOGIST SMEAR REVIEW

## 2020-07-15 LAB — CBC
HCT: 33.3 % — ABNORMAL LOW (ref 39.0–52.0)
Hemoglobin: 10.4 g/dL — ABNORMAL LOW (ref 13.0–17.0)
MCH: 27.4 pg (ref 26.0–34.0)
MCHC: 31.2 g/dL (ref 30.0–36.0)
MCV: 87.6 fL (ref 80.0–100.0)
Platelets: 229 10*3/uL (ref 150–400)
RBC: 3.8 MIL/uL — ABNORMAL LOW (ref 4.22–5.81)
RDW: 15.9 % — ABNORMAL HIGH (ref 11.5–15.5)
WBC: 4.5 10*3/uL (ref 4.0–10.5)
nRBC: 0 % (ref 0.0–0.2)

## 2020-07-15 NOTE — Plan of Care (Signed)
  Problem: Education: Goal: Knowledge of General Education information will improve Description: Including pain rating scale, medication(s)/side effects and non-pharmacologic comfort measures Outcome: Progressing   Problem: Health Behavior/Discharge Planning: Goal: Ability to manage health-related needs will improve Outcome: Progressing   Problem: Clinical Measurements: Goal: Ability to maintain clinical measurements within normal limits will improve Outcome: Progressing   Problem: Coping: Goal: Level of anxiety will decrease Outcome: Progressing   Problem: Safety: Goal: Ability to remain free from injury will improve Outcome: Progressing   Problem: Skin Integrity: Goal: Risk for impaired skin integrity will decrease Outcome: Progressing   

## 2020-07-15 NOTE — Care Management Important Message (Signed)
Important Message  Patient Details  Name: Vernon Blackburn MRN: [id number] Date of Birth: Jun 05, 1965   Medicare Important Message Given:  Yes - Important Message mailed due to current National Emergency    Verbal consent obtained due to current National Emergency  Relationship to patient: Self Contact Name: Tia Call Date: 07/15/20  Time: 1230 Phone: [phone] Outcome: Spoke with contact Important Message mailed to: Patient address on file    Delorse Lek 07/15/2020, 12:31 PM

## 2020-07-15 NOTE — Progress Notes (Signed)
DISCHARGE NOTE HOME MANUS WEEDMAN to be discharged Home per MD order. Discussed prescriptions and follow up appointments with the patient. Prescriptions given to patient; medication list explained in detail. Patient verbalized understanding.  Skin clean, dry and intact without evidence of skin break down, no evidence of skin tears noted. IV catheter discontinued intact. Site without signs and symptoms of complications. Dressing and pressure applied. Pt denies pain at the site currently. No complaints noted.  Patient free of lines, drains, and wounds.   An After Visit Summary (AVS) was printed and given to the patient. Patient escorted via wheelchair, and discharged home via private auto.  Orville Govern, RN

## 2020-07-15 NOTE — Discharge Summary (Signed)
Physician Discharge Summary  Vernon Blackburn VQQ:[id number] DOB: [birth year] DOA: 07/12/2020  PCP: [name], MD  Admit date: 07/12/2020 Discharge date: 07/15/2020  Admitted From: Home Disposition: Home  Recommendations for Outpatient Follow-up:  Follow up with PCP in 1-2 weeks Please obtain BMP/CBC in one week Please follow up with nephrology as scheduled  Home Health: None Equipment/Devices: None  Discharge Condition: Stable CODE STATUS: Full Diet recommendation: Renal dialysis diet  Brief/Interim Summary: 59 male community dwelling; BPH + LUTS status post TURP 05/25/2020 Dr. Lovena Neighbours; ESRD MWF - Transplant-followed at [facility]. Chronic nonischemic cardiomyopathy EF --tachypalpitations seen 06/24/2020 and the plan was for Zio patch with heart failure team 20-25%--HF improved EF 45% 2021 Suprapubic catheter placed by IR 07/09/2020- Prior intermediate probability PE 2018+?  GI bleed. Mitral valve annuloplasty 06/29/2016 Dr. Roxy Manns Prior CVA. Seen in cardiology office 06/24/2020 tachycardia palpitations near syncope-had heart rate in the 140s and felt like he was going to pass out. On eval-found to be in sinus rhythm blood pressure stable. Tells me he went to an urgent care or ED end of June they attempted--he was having retention of urine, they attempted to catheterize him and apparently they were "rough" and they could not get it in--he had a supra Pubic catheter placed on 7/7 by IR. Poor suprapubic catheter drainage called his urologist and came to the ED at their request. Work-up in ED = ascites on CT scan.  Patient admitted as above with acute onset ascites per, patient's preliminary findings from paracentesis are without infection, does not appear to be vascular related related given low SAAG score -there is lengthy discussion with care team about likely noncompliance at home, we had a lengthy discussion at bedside about need for dietary and lifestyle compliance given end-stage renal disease no  longer on peritoneal dialysis requiring intermittent hemodialysis.  After paracentesis patient tolerated dialysis quite well, fluid has not returned, making lifestyle noncompliance more likely cause of patient's volume overload, it is interesting that patient did not have diffuse volume overload in his extremities but more focused in his abdomen, could be a result of previous peritoneal dialysis history although less likely.  Discussed with patient should he continue to have ongoing fluid retention would focus on fluid intake and salt intake at home as well as have further discussion with nephrologist about volume control with dialysis.  Discharge Diagnoses:  Active Problems:   Anemia   Chronic systolic HF (heart failure) (HCC)   S/P minimally invasive mitral valve repair   ESRD on dialysis [facility])   Ascites   Essential hypertension   Asthma    Discharge Instructions  Discharge Instructions     Diet - low sodium heart healthy   Complete by: As directed    Increase activity slowly   Complete by: As directed       Allergies as of 07/15/2020       Reactions   Isosorbide    Hypotension         Medication List     STOP taking these medications    ciprofloxacin 500 MG tablet Commonly known as: CIPRO   phenazopyridine 200 MG tablet Commonly known as: Pyridium       TAKE these medications    atorvastatin 20 MG tablet Commonly known as: LIPITOR Take 20 mg by mouth daily.   Breztri Aerosphere 160-9-4.8 MCG/ACT Aero Generic drug: Budeson-Glycopyrrol-Formoterol Inhale 2 puffs into the lungs 2 (two) times daily as needed (congestion/respiratory issues.).   calcitRIOL 0.5 MCG capsule Commonly  known as: ROCALTROL Take 0.5 mcg by mouth every Monday, Wednesday, and Friday.   calcium acetate 667 MG capsule Commonly known as: PHOSLO Take 2,668 mg by mouth 3 (three) times daily with meals.   carvedilol 12.5 MG tablet Commonly known as: COREG Take 12.5 mg by mouth daily at  6 (six) AM.   Febuxostat 80 MG Tabs Take 80 mg by mouth daily.   fexofenadine 180 MG tablet Commonly known as: ALLEGRA Take 180 mg by mouth in the morning.   fluocinonide ointment 0.05 % Commonly known as: LIDEX Apply 1 application topically 2 (two) times daily as needed (eczema). (Hands)   fluticasone 50 MCG/ACT nasal spray Commonly known as: FLONASE Place 1 spray into both nostrils daily as needed for allergies or rhinitis.   furosemide 80 MG tablet Commonly known as: LASIX Take 80 mg by mouth daily.   lactulose 10 GM/15ML solution Commonly known as: CHRONULAC Take 10 g by mouth 2 (two) times daily as needed for mild constipation.   pimecrolimus 1 % cream Commonly known as: Elidel Apply topically 2 (two) times daily. What changed:  how much to take when to take this reasons to take this   polyethylene glycol 17 g packet Commonly known as: MIRALAX / GLYCOLAX Take 17 g by mouth daily as needed.   ProAir RespiClick 756 (90 Base) MCG/ACT Aepb Generic drug: Albuterol Sulfate Inhale 2 puffs into the lungs every 6 (six) hours as needed (wheezing/shortness of breath).   sildenafil 20 MG tablet Commonly known as: REVATIO Take 20 mg by mouth daily as needed (erectile dysfunction).   tacrolimus 0.1 % ointment Commonly known as: PROTOPIC Apply 1 application topically 2 (two) times daily as needed (eczema). (eyelids/hands)   tamsulosin 0.4 MG Caps capsule Commonly known as: FLOMAX Take 0.4 mg by mouth in the morning.   Vyvanse 30 MG capsule Generic drug: lisdexamfetamine Take 30 mg by mouth every morning.        Follow-up Information     [facility] Follow up in 3 week(s).   Why: Will call to arrange follow-up appointment Contact information: [place] [phone]               Allergies  Allergen Reactions   Isosorbide     Hypotension      Consultations: Nephrology   Procedures/Studies: CT PELVIS WO CONTRAST  Result Date: 07/12/2020 CLINICAL DATA:  55 year old male with bladder mass. Suprapubic catheter placed 3 days ago with minimal drainage. Evaluate for location the pigtail catheter. EXAM: CT PELVIS WITHOUT CONTRAST TECHNIQUE: Multidetector CT imaging of the pelvis was performed following the standard protocol without intravenous contrast. COMPARISON:  CT abdomen pelvis dated 01/22/2020. FINDINGS: Evaluation of this exam is limited in the absence of intravenous contrast. Urinary Tract: The urinary bladder is decompressed around a suprapubic catheter. The pigtail tip of the suprapubic catheter is located within the bladder lumen. Bowel: No bowel dilatation or evidence of obstruction. The appendix is normal. Vascular/Lymphatic: Minimal atherosclerotic calcification of the iliac arteries. Reproductive: The prostate and seminal vesicles are grossly unremarkable. Other: There is moderate ascites. As well as anasarca. There has been significant interval increase in the size of the status compared to the CT of 01/22/2020. Clinical correlation is recommended. Musculoskeletal: Degenerative changes primarily at L5-S1. No acute osseous pathology. IMPRESSION: 1. Suprapubic catheter with pigtail tip located within the bladder lumen. The bladder is completely decompressed around the catheter. 2. Moderate ascites, significantly increased since  01/22/2020. Clinical correlation is recommended. These results were called by telephone at the time of interpretation on 07/12/2020 at 8:34 pm to provider MATTHEW TRIFAN , who verbally acknowledged these results. Electronically Signed   By: Anner Crete M.D.   On: 07/12/2020 20:34   ECHOCARDIOGRAM COMPLETE  Result Date: 07/13/2020    ECHOCARDIOGRAM REPORT   Patient Name:   ADOLPHUS HANF Date of Exam: 07/13/2020 Medical Rec #:  [id number]       Height:       70.0 in Accession #:    [id number]      Weight:        175.9 lb Date of Birth:  1965/04/21        BSA:          1.977 m Patient Age:    33 years        BP:           108/86 mmHg Patient Gender: M               HR:           76 bpm. Exam Location:  Inpatient Procedure: 2D Echo, 3D Echo, Color Doppler, Cardiac Doppler and Strain Analysis Indications:    CHF-Acute Systolic F81.01  History:        Patient has prior history of Echocardiogram examinations, most                 recent 03/26/2019. CHF, Mitral Valve Disease and MR repair,                 MR; Risk Factors:Hypertension. CKD.                  Mitral Valve: 30 mm Ring Mitral Memo 3D 30MM valve is present in                 the mitral position. Procedure Date: 06/29/2016.  Sonographer:    Darlina Sicilian RDCS Referring Phys: [place]  1. Left ventricular ejection fraction, by estimation, is 50 to 55%. The left ventricle has low normal function. The left ventricle demonstrates global hypokinesis. The left ventricular internal cavity size was mildly dilated. Left ventricular diastolic function could not be evaluated.  2. Right ventricular systolic function is normal. The right ventricular size is normal.  3. The mitral valve has been repaired/replaced. Trivial mitral valve regurgitation. No evidence of mitral stenosis. The mean mitral valve gradient is 5.5 mmHg. There is a 30 mm Ring Mitral Memo 3D 30MM present in the mitral position. Procedure Date: 06/29/2016.  4. The aortic valve is normal in structure. Aortic valve regurgitation is not visualized. No aortic stenosis is present.  5. Aortic dilatation noted. There is dilatation of the aortic root, measuring 37 mm.  6. The inferior vena cava is normal in size with greater than 50% respiratory variability, suggesting right atrial pressure of 3 mmHg.  7. No significant change compated to TEE 2021 FINDINGS  Left Ventricle: Left ventricular ejection fraction, by estimation, is 50 to 55%. The left ventricle has low normal function. The left  ventricle demonstrates global hypokinesis. The left ventricular internal cavity size was mildly dilated. There is no left ventricular hypertrophy. Left ventricular diastolic function could not be evaluated due to mitral valve repair. Left ventricular diastolic function could not be evaluated. Right Ventricle: The right ventricular size is normal. No increase in right ventricular wall thickness. Right ventricular systolic function is normal. Left Atrium: Left atrial size  was normal in size. Right Atrium: Right atrial size was normal in size. Pericardium: There is no evidence of pericardial effusion. Mitral Valve: The mitral valve has been repaired/replaced. There is moderate thickening of the mitral valve leaflet(s). Trivial mitral valve regurgitation. There is a 30 mm Ring Mitral Memo 3D 30MM present in the mitral position. Procedure Date: 06/29/2016. No evidence of mitral valve stenosis. MV peak gradient, 10.2 mmHg. The mean mitral valve gradient is 5.5 mmHg. Tricuspid Valve: The tricuspid valve is normal in structure. Tricuspid valve regurgitation is not demonstrated. No evidence of tricuspid stenosis. Aortic Valve: The aortic valve is normal in structure. Aortic valve regurgitation is not visualized. No aortic stenosis is present. Pulmonic Valve: The pulmonic valve was normal in structure. Pulmonic valve regurgitation is not visualized. No evidence of pulmonic stenosis. Aorta: The aortic root is normal in size and structure and aortic dilatation noted. There is dilatation of the aortic root, measuring 37 mm. Venous: The inferior vena cava is normal in size with greater than 50% respiratory variability, suggesting right atrial pressure of 3 mmHg. IAS/Shunts: No atrial level shunt detected by color flow Doppler.  LEFT VENTRICLE PLAX 2D LVIDd:         5.50 cm  Diastology LVIDs:         4.40 cm  LV e' medial:    5.11 cm/s LV PW:         1.10 cm  LV E/e' medial:  24.0 LV IVS:        1.00 cm  LV e' lateral:   9.67 cm/s  LVOT diam:     2.50 cm  LV E/e' lateral: 12.7 LV SV:         75 LV SV Index:   38 LVOT Area:     4.91 cm  RIGHT VENTRICLE RV S prime:     10.90 cm/s TAPSE (M-mode): 1.6 cm LEFT ATRIUM             Index       RIGHT ATRIUM           Index LA diam:        4.00 cm 2.02 cm/m  RA Area:     14.30 cm LA Vol (A2C):   45.8 ml 23.17 ml/m RA Volume:   32.40 ml  16.39 ml/m LA Vol (A4C):   63.2 ml 31.97 ml/m LA Biplane Vol: 60.0 ml 30.36 ml/m  AORTIC VALVE LVOT Vmax:   95.80 cm/s LVOT Vmean:  63.750 cm/s LVOT VTI:    0.152 m  AORTA Ao Root diam: 3.70 cm Ao Asc diam:  3.10 cm MITRAL VALVE MV Area (PHT): 2.75 cm     SHUNTS MV Area VTI:   1.93 cm     Systemic VTI:  0.15 m MV Peak grad:  10.2 mmHg    Systemic Diam: 2.50 cm MV Mean grad:  5.5 mmHg MV Vmax:       1.60 m/s MV Vmean:      112.5 cm/s MV Decel Time: 276 msec MV E velocity: 122.67 cm/s MV A velocity: 163.67 cm/s MV E/A ratio:  0.75 Fransico Him MD Electronically signed by Fransico Him MD Signature Date/Time: 07/13/2020/2:14:04 PM    Final    CT IMAGE GUIDED DRAINAGE BY PERCUTANEOUS CATHETER  Result Date: 07/09/2020 CLINICAL DATA:  Urinary retention post TURP. Urethral stricture preventing Foley catheter placement. Renal failure on hemodialysis. Abdominal distension. EXAM: CT GUIDED SUPRAPUBIC CATHETER PLACEMENT COMPARISON:  CT 03/02/2020 ANESTHESIA/SEDATION: Intravenous Fentanyl 13mcg and  Versed 2mg  were administered as conscious sedation during continuous monitoring of the patient's level of consciousness and physiological / cardiorespiratory status by the radiology RN, with a total moderate sedation time of 20 minutes. PROCEDURE: Informed written consent was obtained from the patient after a thorough discussion of the procedural risks, benefits and alternatives. All questions were addressed. Maximal Sterile Barrier Technique was utilized including caps, mask, sterile gowns, sterile gloves, sterile drape, hand hygiene and skin antiseptic. A timeout was  performed prior to the initiation of the procedure. Select axial CT scans were obtained through the urinary bladder. An appropriate skin entry site was determined and marked. Skin site was prepped with chlorhexidine, draped in usual sterile fashion, infiltrated locally with 1% lidocaine. Under intermittent angled CT fluoroscopic guidance, a 21 gauge needle was advanced into the urinary bladder using suprapubic extraperitoneal approach. 018 guidewire advanced easily. Needle exchanged for a transitional dilator, position confirmed on CT. The bladder was further distended with 240 mL sterile saline. An Amplatz guidewire was advanced through dilator into the urinary bladder. Tract dilated to facilitate placement of a 12 French pigtail catheter, formed within the lumen of the urinary bladder. Position confirmed on CT. Catheter secured externally with 0 Prolene suture and placed to gravity drain bag. The patient tolerated the procedure well. COMPLICATIONS: None immediate. IMPRESSION: 1. Technically successful CT-guided suprapubic catheter placement. 2. Large volume pelvic ascites incidentally noted. Electronically Signed   By: Lucrezia Europe M.D.   On: 07/09/2020 14:47   CT CYSTOGRAM PELVIS  Result Date: 07/13/2020 CLINICAL DATA:  Abdominal distension. Suprapubic catheter is not drawn. Assess for possible bladder injury. EXAM: CT CYSTOGRAM (CT PELVIS WITH CONTRAST) TECHNIQUE: Multidetector CT imaging through the pelvis was performed after dilute contrast had been introduced into the bladder for the purposes of performing CT cystography. CONTRAST:  68mL OMNIPAQUE IOHEXOL 300 MG/ML  SOLN COMPARISON:  CT pelvis 07/12/2020 FINDINGS: Urinary Tract: There is contrast in the bladder. No evidence of extravasation/bladder leak. Moderate diffuse bladder wall thickening. The suprapubic catheter is in good position without complicating features. Surgical changes from recent TURP with contrast extending down into the TURP defect. The  prostate gland is otherwise unremarkable. The seminal vesicles are normal. Bowel: The rectum, sigmoid colon and visualized small bowel loops unremarkable. Moderate free pelvic fluid is noted. Vascular/Lymphatic: Scattered vascular calcifications are stable. Reproductive:  TURP defect.  No other significant findings. Other:  Moderate lower abdominal and free pelvic fluid. Musculoskeletal: No significant bony findings. IMPRESSION: 1. Surgical changes from recent TURP with contrast extending down into the TURP defect. No evidence of extravasation/bladder leak. 2. Intact suprapubic catheter without complicating features. 3. Moderate lower abdominal and free pelvic fluid. Electronically Signed   By: Marijo Sanes M.D.   On: 07/13/2020 16:45   IR Paracentesis  Result Date: 07/13/2020 INDICATION: Patient with history of end-stage renal disease with prior peritoneal dialysis, hypertension, CHF, urethral stricture, BPH, recent suprapubic catheter placement, ascites. Request received for diagnostic and therapeutic paracentesis. EXAM: ULTRASOUND GUIDED DIAGNOSTIC AND THERAPEUTIC PARACENTESIS MEDICATIONS: 1% lidocaine to skin and subcutaneous tissue COMPLICATIONS: None immediate. PROCEDURE: Informed written consent was obtained from the patient after a discussion of the risks, benefits and alternatives to treatment. A timeout was performed prior to the initiation of the procedure. Initial ultrasound scanning demonstrates a large amount of ascites within the left lower abdominal quadrant. The left lower abdomen was prepped and draped in the usual sterile fashion. 1% lidocaine was used for local anesthesia. Following this, a 19 gauge, 10-cm,  Yueh catheter was introduced. An ultrasound image was saved for documentation purposes. The paracentesis was performed. The catheter was removed and a dressing was applied. The patient tolerated the procedure well without immediate post procedural complication. FINDINGS: A total of  approximately 6.7 liters of yellow fluid was removed. Samples were sent to the laboratory as requested by the clinical team. IMPRESSION: Successful ultrasound-guided diagnostic and therapeutic paracentesis yielding 6.7 liters of peritoneal fluid. Read by: Rowe Robert, PA-C Electronically Signed   By: Corrie Mckusick D.O.   On: 07/13/2020 12:06     Subjective: No acute issues or events overnight, fluid has not returned he remains in good spirits denies nausea vomiting diarrhea constipation headache fevers chills chest pain shortness of breath and is otherwise requesting discharge home.   Discharge Exam: Vitals:   07/14/20 2111 07/15/20 0512  BP: 96/66 98/73  Pulse: 83 83  Resp: 18 20  Temp: 98.7 F (37.1 C) 98.6 F (37 C)  SpO2: 96% 95%   Vitals:   07/14/20 1630 07/14/20 1723 07/14/20 2111 07/15/20 0512  BP: 113/83 113/86 96/66 98/73   Pulse:  87 83 83  Resp:  18 18 20   Temp: 98.4 F (36.9 C)  98.7 F (37.1 C) 98.6 F (37 C)  TempSrc: Oral  Oral Oral  SpO2: 97% 99% 96% 95%  Weight: 72.5 kg     Height:        General: Pt is alert, awake, not in acute distress Cardiovascular: RRR, S1/S2 +, no rubs, no gallops Respiratory: CTA bilaterally, no wheezing, no rhonchi Abdominal: Soft, NT, ND, bowel sounds + Extremities: no edema, no cyanosis    The results of significant diagnostics from this hospitalization (including imaging, microbiology, ancillary and laboratory) are listed below for reference.     Microbiology: Recent Results (from the past 240 hour(s))  Resp Panel by RT-PCR (Flu A&B, Covid) Nasopharyngeal Swab     Status: None   Collection Time: 07/12/20 10:20 PM   Specimen: Nasopharyngeal Swab; Nasopharyngeal(NP) swabs in vial transport medium  Result Value Ref Range Status   SARS Coronavirus 2 by RT PCR NEGATIVE NEGATIVE Final    Comment: (NOTE) SARS-CoV-2 target nucleic acids are NOT DETECTED.  The SARS-CoV-2 RNA is generally detectable in upper  respiratory specimens during the acute phase of infection. The lowest concentration of SARS-CoV-2 viral copies this assay can detect is 138 copies/mL. A negative result does not preclude SARS-Cov-2 infection and should not be used as the sole basis for treatment or other patient management decisions. A negative result may occur with  improper specimen collection/handling, submission of specimen other than nasopharyngeal swab, presence of viral mutation(s) within the areas targeted by this assay, and inadequate number of viral copies(<138 copies/mL). A negative result must be combined with clinical observations, patient history, and epidemiological information. The expected result is Negative.  Fact Sheet for Patients:  EntrepreneurPulse.com.au  Fact Sheet for Healthcare Providers:  IncredibleEmployment.be  This test is no t yet approved or cleared by the [place] FDA and  has been authorized for detection and/or diagnosis of SARS-CoV-2 by FDA under an Emergency Use Authorization (EUA). This EUA will remain  in effect (meaning this test can be used) for the duration of the COVID-19 declaration under Section 564(b)(1) of the Act, 21 U.S.C.section 360bbb-3(b)(1), unless the authorization is terminated  or revoked sooner.       Influenza A by PCR NEGATIVE NEGATIVE Final   Influenza B by PCR NEGATIVE NEGATIVE Final    Comment: (NOTE)  The Xpert Xpress SARS-CoV-2/FLU/RSV plus assay is intended as an aid in the diagnosis of influenza from Nasopharyngeal swab specimens and should not be used as a sole basis for treatment. Nasal washings and aspirates are unacceptable for Xpert Xpress SARS-CoV-2/FLU/RSV testing.  Fact Sheet for Patients: EntrepreneurPulse.com.au  Fact Sheet for Healthcare Providers: IncredibleEmployment.be  This test is not yet approved or cleared by the [place] FDA and has been  authorized for detection and/or diagnosis of SARS-CoV-2 by FDA under an Emergency Use Authorization (EUA). This EUA will remain in effect (meaning this test can be used) for the duration of the COVID-19 declaration under Section 564(b)(1) of the Act, 21 U.S.C. section 360bbb-3(b)(1), unless the authorization is terminated or revoked.  Performed at [facility], [place] [address], [place], [place] [address]   MRSA Next Gen by PCR, Nasal     Status: None   Collection Time: 07/13/20  3:09 AM   Specimen: Nasal Mucosa; Nasal Swab  Result Value Ref Range Status   MRSA by PCR Next Gen NOT DETECTED NOT DETECTED Final    Comment: (NOTE) The GeneXpert MRSA Assay (FDA approved for NASAL specimens only), is one component of a comprehensive MRSA colonization surveillance program. It is not intended to diagnose MRSA infection nor to guide or monitor treatment for MRSA infections. Test performance is not FDA approved in patients less than 34 years old. Performed at [facility], [place] [address], [place], [place] [address]   Culture, body fluid w Gram Stain-bottle     Status: None (Preliminary result)   Collection Time: 07/13/20 11:14 AM   Specimen: Peritoneal Washings  Result Value Ref Range Status   Specimen Description PERITONEAL FLUID  Final   Special Requests ABDOMEN  Final   Culture   Final    NO GROWTH 2 DAYS Performed at [facility] [address] [place], [place] [address]    Report Status PENDING  Incomplete  Gram stain     Status: None   Collection Time: 07/13/20 11:14 AM   Specimen: Peritoneal Washings  Result Value Ref Range Status   Specimen Description PERITONEAL FLUID  Final   Special Requests ABDOMEN  Final   Gram Stain   Final    WBC PRESENT,BOTH PMN AND MONONUCLEAR NO ORGANISMS SEEN Performed at [facility], [address], [place], [place] [address]    Report Status 07/14/2020 FINAL  Final     Labs: BNP (last 3  results) Recent Labs    07/12/20 2034  BNP 17.6   Basic Metabolic Panel: Recent Labs  Lab 07/09/20 1016 07/12/20 2033 07/13/20 0421 07/14/20 0303 07/15/20 0306  NA 140 140 140 139 141  K 4.1 3.7 3.1* 3.4* 3.8  CL 97* 95* 96* 98 101  CO2 32 33* 34* 35* 33*  GLUCOSE 87 82 82 84 76  BUN 34* 16 14 21* 13  CREATININE 9.79* 7.13* 7.76* 9.68* 7.26*  CALCIUM 8.9 8.4* 8.3* 8.3* 8.5*  MG  --   --  2.4  --   --   PHOS  --   --  3.3 3.9  --    Liver Function Tests: Recent Labs  Lab 07/12/20 2033 07/13/20 0421 07/14/20 0303  AST 27 22  --   ALT 16 13  --   ALKPHOS 46 41  --   BILITOT 0.5 0.8  --   PROT 6.5 5.5*  --   ALBUMIN 3.5 2.9* 2.8*   No results  for input(s): LIPASE, AMYLASE in the last 168 hours. No results for input(s): AMMONIA in the last 168 hours. CBC: Recent Labs  Lab 07/09/20 1016 07/12/20 2033 07/13/20 0421 07/14/20 0303 07/15/20 0306  WBC 5.6 4.3 4.2 4.5 4.5  NEUTROABS 3.3 2.5 2.5 2.5  --   HGB 12.3* 12.3* 11.5* 9.7* 10.4*  HCT 40.5 39.9 36.7* 30.9* 33.3*  MCV 92.7 90.5 88.4 87.3 87.6  PLT 253 214 217 211 229   Cardiac Enzymes: No results for input(s): CKTOTAL, CKMB, CKMBINDEX, TROPONINI in the last 168 hours. BNP: Invalid input(s): POCBNP CBG: No results for input(s): GLUCAP in the last 168 hours. D-Dimer No results for input(s): DDIMER in the last 72 hours. Hgb A1c No results for input(s): HGBA1C in the last 72 hours. Lipid Profile No results for input(s): CHOL, HDL, LDLCALC, TRIG, CHOLHDL, LDLDIRECT in the last 72 hours. Thyroid function studies Recent Labs    07/13/20 0421  TSH 1.379   Anemia work up No results for input(s): VITAMINB12, FOLATE, FERRITIN, TIBC, IRON, RETICCTPCT in the last 72 hours. Urinalysis    Component Value Date/Time   COLORURINE YELLOW 01/05/2019 0823   APPEARANCEUR CLEAR 01/05/2019 0823   LABSPEC 1.009 01/05/2019 0823   PHURINE 5.0 01/05/2019 0823   GLUCOSEU NEGATIVE 01/05/2019 0823   HGBUR SMALL (A)  01/05/2019 0823   BILIRUBINUR NEGATIVE 01/05/2019 0823   KETONESUR NEGATIVE 01/05/2019 0823   PROTEINUR 100 (A) 01/05/2019 0823   NITRITE NEGATIVE 01/05/2019 0823   LEUKOCYTESUR NEGATIVE 01/05/2019 0823   Sepsis Labs Invalid input(s): PROCALCITONIN,  WBC,  LACTICIDVEN Microbiology Recent Results (from the past 240 hour(s))  Resp Panel by RT-PCR (Flu A&B, Covid) Nasopharyngeal Swab     Status: None   Collection Time: 07/12/20 10:20 PM   Specimen: Nasopharyngeal Swab; Nasopharyngeal(NP) swabs in vial transport medium  Result Value Ref Range Status   SARS Coronavirus 2 by RT PCR NEGATIVE NEGATIVE Final    Comment: (NOTE) SARS-CoV-2 target nucleic acids are NOT DETECTED.  The SARS-CoV-2 RNA is generally detectable in upper respiratory specimens during the acute phase of infection. The lowest concentration of SARS-CoV-2 viral copies this assay can detect is 138 copies/mL. A negative result does not preclude SARS-Cov-2 infection and should not be used as the sole basis for treatment or other patient management decisions. A negative result may occur with  improper specimen collection/handling, submission of specimen other than nasopharyngeal swab, presence of viral mutation(s) within the areas targeted by this assay, and inadequate number of viral copies(<138 copies/mL). A negative result must be combined with clinical observations, patient history, and epidemiological information. The expected result is Negative.  Fact Sheet for Patients:  EntrepreneurPulse.com.au  Fact Sheet for Healthcare Providers:  IncredibleEmployment.be  This test is no t yet approved or cleared by the [place] FDA and  has been authorized for detection and/or diagnosis of SARS-CoV-2 by FDA under an Emergency Use Authorization (EUA). This EUA will remain  in effect (meaning this test can be used) for the duration of the COVID-19 declaration under Section 564(b)(1) of  the Act, 21 U.S.C.section 360bbb-3(b)(1), unless the authorization is terminated  or revoked sooner.       Influenza A by PCR NEGATIVE NEGATIVE Final   Influenza B by PCR NEGATIVE NEGATIVE Final    Comment: (NOTE) The Xpert Xpress SARS-CoV-2/FLU/RSV plus assay is intended as an aid in the diagnosis of influenza from Nasopharyngeal swab specimens and should not be used as a sole basis for treatment. Nasal washings and aspirates  are unacceptable for Xpert Xpress SARS-CoV-2/FLU/RSV testing.  Fact Sheet for Patients: EntrepreneurPulse.com.au  Fact Sheet for Healthcare Providers: IncredibleEmployment.be  This test is not yet approved or cleared by the [place] FDA and has been authorized for detection and/or diagnosis of SARS-CoV-2 by FDA under an Emergency Use Authorization (EUA). This EUA will remain in effect (meaning this test can be used) for the duration of the COVID-19 declaration under Section 564(b)(1) of the Act, 21 U.S.C. section 360bbb-3(b)(1), unless the authorization is terminated or revoked.  Performed at [facility], [place] [address], [place], [place] [address]   MRSA Next Gen by PCR, Nasal     Status: None   Collection Time: 07/13/20  3:09 AM   Specimen: Nasal Mucosa; Nasal Swab  Result Value Ref Range Status   MRSA by PCR Next Gen NOT DETECTED NOT DETECTED Final    Comment: (NOTE) The GeneXpert MRSA Assay (FDA approved for NASAL specimens only), is one component of a comprehensive MRSA colonization surveillance program. It is not intended to diagnose MRSA infection nor to guide or monitor treatment for MRSA infections. Test performance is not FDA approved in patients less than 25 years old. Performed at [facility], [place] [address] [place], [place] [address]   Culture, body fluid w Gram Stain-bottle     Status: None (Preliminary result)   Collection Time: 07/13/20 11:14 AM   Specimen:  Peritoneal Washings  Result Value Ref Range Status   Specimen Description PERITONEAL FLUID  Final   Special Requests ABDOMEN  Final   Culture   Final    NO GROWTH 2 DAYS Performed at [place] [address] [place], [place] [address]    Report Status PENDING  Incomplete  Gram stain     Status: None   Collection Time: 07/13/20 11:14 AM   Specimen: Peritoneal Washings  Result Value Ref Range Status   Specimen Description PERITONEAL FLUID  Final   Special Requests ABDOMEN  Final   Gram Stain   Final    WBC PRESENT,BOTH PMN AND MONONUCLEAR NO ORGANISMS SEEN Performed at [facility], [address] [place], [place] [address]    Report Status 07/14/2020 FINAL  Final     Time coordinating discharge: Over 30 minutes  SIGNED:   Little Ishikawa, DO [facility] 07/15/2020, 6:36 PM Pager   If 7PM-7AM, please contact night-coverage [email or web address]

## 2020-07-16 ENCOUNTER — Telehealth: Payer: Self-pay | Admitting: Nephrology

## 2020-07-16 DIAGNOSIS — D509 Iron deficiency anemia, unspecified: Secondary | ICD-10-CM | POA: Diagnosis not present

## 2020-07-16 DIAGNOSIS — Z992 Dependence on renal dialysis: Secondary | ICD-10-CM | POA: Diagnosis not present

## 2020-07-16 DIAGNOSIS — D631 Anemia in chronic kidney disease: Secondary | ICD-10-CM | POA: Diagnosis not present

## 2020-07-16 DIAGNOSIS — N186 End stage renal disease: Secondary | ICD-10-CM | POA: Diagnosis not present

## 2020-07-16 DIAGNOSIS — R110 Nausea: Secondary | ICD-10-CM | POA: Diagnosis not present

## 2020-07-16 DIAGNOSIS — D689 Coagulation defect, unspecified: Secondary | ICD-10-CM | POA: Diagnosis not present

## 2020-07-16 DIAGNOSIS — N2581 Secondary hyperparathyroidism of renal origin: Secondary | ICD-10-CM | POA: Diagnosis not present

## 2020-07-16 NOTE — Telephone Encounter (Signed)
Transition of Care Contact from Inpatient Facility  Date of Discharge: 07/15/20 Date of Contact: 07/16/20 Method of contact: phone - attempted  Attempted to contact patient to discuss transition of care from inpatient admission.  Patient did not answer the phone.  Message was left on patient's voicemail informing them we would attempt to call them again and if unable to reach will follow up at dialysis.  Gionni Freese, PA-C [facility] Pager: [phone]  

## 2020-07-18 LAB — CULTURE, BODY FLUID W GRAM STAIN -BOTTLE: Culture: NO GROWTH

## 2020-07-19 DIAGNOSIS — N186 End stage renal disease: Secondary | ICD-10-CM | POA: Diagnosis not present

## 2020-07-19 DIAGNOSIS — Z992 Dependence on renal dialysis: Secondary | ICD-10-CM | POA: Diagnosis not present

## 2020-07-19 DIAGNOSIS — R110 Nausea: Secondary | ICD-10-CM | POA: Diagnosis not present

## 2020-07-19 DIAGNOSIS — D631 Anemia in chronic kidney disease: Secondary | ICD-10-CM | POA: Diagnosis not present

## 2020-07-19 DIAGNOSIS — N2581 Secondary hyperparathyroidism of renal origin: Secondary | ICD-10-CM | POA: Diagnosis not present

## 2020-07-19 DIAGNOSIS — D509 Iron deficiency anemia, unspecified: Secondary | ICD-10-CM | POA: Diagnosis not present

## 2020-07-19 DIAGNOSIS — D689 Coagulation defect, unspecified: Secondary | ICD-10-CM | POA: Diagnosis not present

## 2020-07-20 ENCOUNTER — Ambulatory Visit (HOSPITAL_COMMUNITY): Admission: RE | Admit: 2020-07-20 | Payer: Medicare Other | Source: Ambulatory Visit

## 2020-07-20 ENCOUNTER — Encounter (HOSPITAL_COMMUNITY): Payer: Self-pay | Admitting: Cardiology

## 2020-07-20 ENCOUNTER — Other Ambulatory Visit: Payer: Self-pay

## 2020-07-20 ENCOUNTER — Ambulatory Visit (HOSPITAL_COMMUNITY)
Admission: RE | Admit: 2020-07-20 | Discharge: 2020-07-20 | Disposition: A | Discharge status: Home / Self Care | Payer: Medicare Other | Source: Ambulatory Visit | Attending: Cardiology | Admitting: Cardiology

## 2020-07-20 VITALS — BP 98/78 | HR 80 | Wt 163.0 lb

## 2020-07-20 DIAGNOSIS — Z992 Dependence on renal dialysis: Secondary | ICD-10-CM | POA: Diagnosis not present

## 2020-07-20 DIAGNOSIS — N186 End stage renal disease: Secondary | ICD-10-CM | POA: Insufficient documentation

## 2020-07-20 DIAGNOSIS — Z9889 Other specified postprocedural states: Secondary | ICD-10-CM

## 2020-07-20 DIAGNOSIS — I052 Rheumatic mitral stenosis with insufficiency: Secondary | ICD-10-CM | POA: Insufficient documentation

## 2020-07-20 DIAGNOSIS — I420 Dilated cardiomyopathy: Secondary | ICD-10-CM | POA: Diagnosis not present

## 2020-07-20 DIAGNOSIS — Z833 Family history of diabetes mellitus: Secondary | ICD-10-CM | POA: Diagnosis not present

## 2020-07-20 DIAGNOSIS — I5023 Acute on chronic systolic (congestive) heart failure: Secondary | ICD-10-CM | POA: Diagnosis not present

## 2020-07-20 DIAGNOSIS — Z8249 Family history of ischemic heart disease and other diseases of the circulatory system: Secondary | ICD-10-CM | POA: Diagnosis not present

## 2020-07-20 DIAGNOSIS — I132 Hypertensive heart and chronic kidney disease with heart failure and with stage 5 chronic kidney disease, or end stage renal disease: Secondary | ICD-10-CM | POA: Insufficient documentation

## 2020-07-20 DIAGNOSIS — I5022 Chronic systolic (congestive) heart failure: Secondary | ICD-10-CM | POA: Diagnosis not present

## 2020-07-20 DIAGNOSIS — E785 Hyperlipidemia, unspecified: Secondary | ICD-10-CM | POA: Insufficient documentation

## 2020-07-20 DIAGNOSIS — J45909 Unspecified asthma, uncomplicated: Secondary | ICD-10-CM | POA: Insufficient documentation

## 2020-07-20 DIAGNOSIS — Z79899 Other long term (current) drug therapy: Secondary | ICD-10-CM | POA: Diagnosis not present

## 2020-07-20 DIAGNOSIS — Z8673 Personal history of transient ischemic attack (TIA), and cerebral infarction without residual deficits: Secondary | ICD-10-CM | POA: Insufficient documentation

## 2020-07-20 MED ORDER — CARVEDILOL 6.25 MG PO TABS: 6.2500 mg | ORAL_TABLET | Freq: Every day | ORAL | 3 refills | Status: DC

## 2020-07-20 NOTE — Patient Instructions (Addendum)
DECREASE Coreg to 6.25 mg, twice a day -only take in the PM on HD days   Your physician recommends that you schedule a follow-up appointment in: 6 months with Dr Aundra Dubin  Do the following things EVERYDAY: Weigh yourself in the morning before breakfast. Write it down and keep it in a log. Take your medicines as prescribed Eat low salt foods--Limit salt (sodium) to 2000 mg per day.  Stay as active as you can everyday Limit all fluids for the day to less than 2 liters  milAt the [facility], you and your health needs are our priority. As part of our continuing mission to provide you with exceptional heart care, we have created designated Provider Care Teams. These Care Teams include your primary Cardiologist (physician) and Advanced Practice Providers (APPs- Physician Assistants and Nurse Practitioners) who all work together to provide you with the care you need, when you need it.   You may see any of the following providers on your designated Care Team at your next follow up: Dr Glori Bickers Dr Loralie Champagne Dr Patrice Paradise, NP Lyda Jester, [place] Ginnie Smart Audry Riles, PharmD   Please be sure to bring in all your medications bottles to every appointment.   If you have any questions or concerns before your next appointment please send [place] a message through [place] or call our office at [phone].    TO LEAVE A MESSAGE FOR THE NURSE SELECT OPTION 2, PLEASE LEAVE A MESSAGE INCLUDING: YOUR NAME DATE OF BIRTH CALL BACK NUMBER REASON FOR CALL**this is important as we prioritize the call backs  YOU WILL RECEIVE A CALL BACK THE SAME DAY AS LONG AS YOU CALL BEFORE 4:00 PM

## 2020-07-20 NOTE — Progress Notes (Signed)
Heart Failure  Note  ID:  Vernon Blackburn, Vernon Blackburn 10-16-1965, MRN [id number] Provider location: [facility] Type of Visit: Established patient   PCP:  [name], MD  Cardiologist:  [name]   History of Present Illness: Vernon Blackburn is a 55 y.o. male who has a history of long-standing, poorly-controlled HTN, CKD stage 3-4, chronic systolic CHF and severe mitral regurgitation.  He presents for followup of CHF and HTN.  He has had hypertension for years.  He has had known CKD, followed by Dr. [name].  He developed severe dyspnea in 12/17 and was admitted with acute systolic CHF.  Echo showed EF 20-25% with severe MR.  He was diuresed and discharged.   TEE was done in 5/18 to assess mitral valve.  There was severe MR.  The MV was thickened and did not coapt well.  Suspect there was a component of secondary MR with moderate LV dilation, however the thickened leaflets suggested a component of primary MR as well.   He had right and left heart cath in 6/18.  No significant CAD, elevated filling pressures.    In 6/18, he had mitral valve repair.  Post-op echo in 7/18 showed EF 25-30% with stable MV repair (mild MR, mean gradient 4 mmHg).  Echo 12/18 with EF 30-35%, moderate LV dilation, s/p MVR repair with trivial MR.  Echo in 4/19 showed EF up to 40-45% with stable repaired mitral valve.   He had an echo in 8/20 with EF 35%, mild LV dilation, s/p MV repair with mean gradient 4 mmHg and trivial MR, mildly decreased RV systolic function.   He was admitted in 2/21 to [facility] with worsening renal function and HD was started.  Echo at [facility] in 2/21 showed EF 40-45%, mild LV dilation, moderate LVH, "severe mitral stenosis" with mean gradient 7 mmHg and eccentric mild MR.  Head imagining showed an old CVA.   TEE was done to followup on ?severe mitral stenosis in 3/21.  This showed EF 45%, moderate LVH, global mild hypokinesis, mildly decreased RV systolic function; s/p MV repair,  mild MR with mean gradient 4 mmHg, no significant mitral stenosis.  CPX in 11/21 showed peak VO2 17.7 (51% predicted), RER 1.12, VE/VCO2 slope 29.  Moderate functional limitation due to mixed restrictive/obstructive lung disease.  Echo in 7/22 showed EF 50-55%, s/p MV repair with trivial MR and mean gradient 5 mmHg.   He returns for followup of CHF.  He is now off PD and on HD.  He was admitted earlier this month with abdominal distention and found to have ascites, cause still uncertain (unless simply due to inadequate dialysis prescription).  No evidence for cirrhosis. He had paracentesis. He additionally had TURP for enlarged prostate and has ended up with a suprapubic catheter.  Symptomatically seems to be doing ok.  He has occasional lightheaded spells with standing and BP runs low.  He wore an event monitor recently but results are not yet available.  No chest pain.  No significant exertional dyspnea.    ECG (7/22, personally reviewed): NSR, QTc 493   Labs (4/18): K 3.7, creatinine 3.05, hgb 10.4 Labs (5/18): K 3.3, creatinine 2.89, BNP 789 Labs (6/18): K 3.4, creatinine 2.4, hgb 10.1 Labs (7/18): K 3.2, creatinine 2.27, BNP 303 Labs (9/18): LDL 78, HDL 56, K 3.8, creatinine 2.16 Labs (10/18): hgb 10.1 Labs (2/19): K 3.3, creatinine 2.75, hgb 12.1 Labs (5/19): K 3.6, creatinine 3.07 Labs (7/20): K 4,  creatinine 3.55, hgb 12.8 Labs (2/21): LDL 127 Labs (7/22): hgb 10.4  PMH: 1. Gout 2. HTN: Long-standing, poor control. 3. ESRD.  Likely hypertensive nephropathy.  4. Dilated cardiomyopathy: May be due to long-standing HTN.  - Echo (1/18): EF 20-25%, severe MR - Echo (2/18): EF 25%, severe MR.  - TEE (5/18): Moderate LV dilation with severe global hypokinesis, EF 25-30%, normal RV size with mildly decreased systolic function, RV-RA gradient 55 mmHg, severe MR with mildly thickened leaflets with inadequate coaptation and ERO 0.52 cm^2 by PISA and systolic flow reversal in the pulmonary vein  doppler pattern => secondary MR from dilated LV but thickened leaflets lead to concern for component of primary MR as well.  - RHC/LHC (6/18): No significant CAD.  Mean RA 15, PA 76/31 mean 48, mean PCWP 45, CI 3.02 Fick, 2.64 thermo.  - Echo (7/18): EF 25-30%, mild dilation, diffuse hypokinesis, s/p MV repair with mean gradient 4 mmHg and mild MR, PASP 37 mmHg, normal RV size and systolic function.  - Echo (12/18): EF 30-35%, moderate LV dilation with diffuse hypokinesis, stable MV repair.  - Echo (4/19): EF 40-45%, severe LV dilation, s/p mitral valve repair with mean gradient 4 mmHg, no mitral regurgitation, severe RV dilation with normal systolic function.  - Echo (8/20): EF 35%, mild LV dilation, s/p MV repair with mean gradient 4 mmHg and trivial MR, mildly decreased RV systolic function.  - Echo (2/21, [facility]):  EF 40-45%, mild LV dilation, moderate LVH, "severe mitral stenosis" with mean gradient 7 mmHg and eccentric mild MR. - TEE (3/21): EF 45%, moderate LVH, global mild hypokinesis, mildly decreased RV systolic function; s/p MV repair, mild MR with mean gradient 4 mmHg, no significant mitral stenosis.  - CPX (11/21): peak VO2 17.7 (51% predicted), RER 1.12, VE/VCO2 slope 29.  Moderate functional limitation due to mixed restrictive/obstructive lung disease.  - Echo (7/22): EF 50-55%, s/p MV repair with trivial MR and mean gradient 5 mmHg.  5. Severe MR: S/p MV repair in 6/18. 6. H/o CVA 7. BPH: S/p TURP, now with suprapubic catheter.  8. H/o ascites (7/22) with paracentesis.   SH: Married, lives in [place], nonsmoker, no drugs, occasional ETOH.  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   Family History  Problem Relation Age of Onset   Diabetes Mother    Hypertension Mother    Heart failure Mother    Colon cancer Neg Hx    Esophageal cancer Neg Hx    Rectal cancer Neg Hx    Stomach cancer Neg Hx    ROS: All systems reviewed and negative except as per HPI.    Current Outpatient Medications  Medication Sig Dispense Refill   Albuterol Sulfate (PROAIR RESPICLICK) 643 (90 Base) MCG/ACT AEPB Inhale 2 puffs into the lungs every 6 (six) hours as needed (wheezing/shortness of breath).     atorvastatin (LIPITOR) 20 MG tablet Take 20 mg by mouth daily.     Budeson-Glycopyrrol-Formoterol (BREZTRI AEROSPHERE) 160-9-4.8 MCG/ACT AERO Inhale 2 puffs into the lungs 2 (two) times daily as needed (congestion/respiratory issues.).     calcitRIOL (ROCALTROL) 0.5 MCG capsule Take 0.5 mcg by mouth every Monday, Wednesday, and Friday.     calcium acetate (PHOSLO) 667 MG capsule Take 2,668 mg by mouth 3 (three) times daily with meals.     Febuxostat 80 MG TABS Take 80 mg by mouth daily.     fexofenadine (ALLEGRA) 180 MG tablet Take 180 mg by mouth in the morning.  fluocinonide ointment (LIDEX) 7.67 % Apply 1 application topically 2 (two) times daily as needed (eczema). (Hands)     fluticasone (FLONASE) 50 MCG/ACT nasal spray Place 1 spray into both nostrils daily as needed for allergies or rhinitis.      furosemide (LASIX) 80 MG tablet Take 80 mg by mouth daily.     lactulose (CHRONULAC) 10 GM/15ML solution Take 10 g by mouth 2 (two) times daily as needed for mild constipation.     pimecrolimus (ELIDEL) 1 % cream Apply topically 2 (two) times daily. 100 g 2   polyethylene glycol (MIRALAX / GLYCOLAX) 17 g packet Take 17 g by mouth daily as needed.     sildenafil (REVATIO) 20 MG tablet Take 20 mg by mouth daily as needed (erectile dysfunction).     tacrolimus (PROTOPIC) 0.1 % ointment Apply 1 application topically 2 (two) times daily as needed (eczema). (eyelids/hands)     tamsulosin (FLOMAX) 0.4 MG CAPS capsule Take 0.4 mg by mouth in the morning.     VYVANSE 30 MG capsule Take 30 mg by mouth every morning.     carvedilol (COREG) 6.25 MG tablet Take 1 tablet (6.25 mg total) by mouth daily at 6 (six) AM. 60 tablet 3   No current facility-administered medications for  this encounter.   Exam:   BP 98/78   Pulse 80   Wt 73.9 kg (163 lb)   SpO2 99%   BMI 23.39 kg/m  General: NAD Neck: No JVD, no thyromegaly or thyroid nodule.  Lungs: Clear to auscultation bilaterally with normal respiratory effort. CV: Nondisplaced PMI.  Heart regular S1/S2, no S3/S4, no murmur.  No peripheral edema.  No carotid bruit.  Normal pedal pulses.  Abdomen: Soft, nontender, no hepatosplenomegaly, no distention.  Skin: Intact without lesions or rashes.  Neurologic: Alert and oriented x 3.  Psych: Normal affect. Extremities: No clubbing or cyanosis.  HEENT: Normal.   Assessment/Plan: 1. HTN: Possible hypertensive cardiomyopathy and hypertensive nephropathy.  BP now running low.  2. ESRD: He was switched from PD to HD, working up for renal transplant at [facility].  3. Chronic systolic CHF: Echo in 2/09 with EF 25-30%. Nonischemic cardiomyopathy based on cath in 6/18.  Cause of cardiomyopathy may be long-standing, poorly controlled HTN.  NYHA class II symptoms.  He improved symptomatically s/p MV repair.  Echo in 4/19 with EF 40-45%. Echo at [facility] in 2/21 showed EF 40-45%.   TEE in 3/21 with EF 45%.  Echo in 5/22 with EF up to 50-55%.  NYHA class II. He is not volume overloaded on exam.  - With orthostatic symptoms and soft BP, decrease Coreg to 6.25 mg bid.  4. Mitral regurgitation: s/p MV repair for severe MR.  Much improved symptomatically s/p surgery. Echo at [facility] in 2/21 suggested "severe" mitral stenosis with mean gradient 7 mmHg and eccentric MR, ?mild.  However, TEE here in 3/21 showed stable MV repair with mild MR and no significant mitral stenosis.  Echo in 5/22 showed stable repaired mitral valve with no more than mild stenosis and trivial regurgitation.  - Will need abx with dental work given prosthetic material.  5. Asthma: Occasional wheezing, not exertional.  Abnormal CPX in the past suggesting lung pathology. 6. CVA: Prior CVA by head imaging.   7. Hyperlipidemia: Continue  atorvastatin.   Followup in 6 months.   Signed, Loralie Champagne, MD  07/20/2020  [place] [address] [facility] [place] [address] [phone]  [phone] (office) [phone] (fax)

## 2020-07-21 DIAGNOSIS — R110 Nausea: Secondary | ICD-10-CM | POA: Diagnosis not present

## 2020-07-21 DIAGNOSIS — Z992 Dependence on renal dialysis: Secondary | ICD-10-CM | POA: Diagnosis not present

## 2020-07-21 DIAGNOSIS — D631 Anemia in chronic kidney disease: Secondary | ICD-10-CM | POA: Diagnosis not present

## 2020-07-21 DIAGNOSIS — D509 Iron deficiency anemia, unspecified: Secondary | ICD-10-CM | POA: Diagnosis not present

## 2020-07-21 DIAGNOSIS — N2581 Secondary hyperparathyroidism of renal origin: Secondary | ICD-10-CM | POA: Diagnosis not present

## 2020-07-21 DIAGNOSIS — N186 End stage renal disease: Secondary | ICD-10-CM | POA: Diagnosis not present

## 2020-07-21 DIAGNOSIS — D689 Coagulation defect, unspecified: Secondary | ICD-10-CM | POA: Diagnosis not present

## 2020-07-22 DIAGNOSIS — N186 End stage renal disease: Secondary | ICD-10-CM | POA: Diagnosis not present

## 2020-07-22 DIAGNOSIS — Z992 Dependence on renal dialysis: Secondary | ICD-10-CM | POA: Diagnosis not present

## 2020-07-22 DIAGNOSIS — I5022 Chronic systolic (congestive) heart failure: Secondary | ICD-10-CM | POA: Diagnosis not present

## 2020-07-22 DIAGNOSIS — R188 Other ascites: Secondary | ICD-10-CM | POA: Diagnosis not present

## 2020-07-23 DIAGNOSIS — D509 Iron deficiency anemia, unspecified: Secondary | ICD-10-CM | POA: Diagnosis not present

## 2020-07-23 DIAGNOSIS — D631 Anemia in chronic kidney disease: Secondary | ICD-10-CM | POA: Diagnosis not present

## 2020-07-23 DIAGNOSIS — Z992 Dependence on renal dialysis: Secondary | ICD-10-CM | POA: Diagnosis not present

## 2020-07-23 DIAGNOSIS — D689 Coagulation defect, unspecified: Secondary | ICD-10-CM | POA: Diagnosis not present

## 2020-07-23 DIAGNOSIS — R188 Other ascites: Secondary | ICD-10-CM | POA: Diagnosis not present

## 2020-07-23 DIAGNOSIS — N186 End stage renal disease: Secondary | ICD-10-CM | POA: Diagnosis not present

## 2020-07-23 DIAGNOSIS — R110 Nausea: Secondary | ICD-10-CM | POA: Diagnosis not present

## 2020-07-23 DIAGNOSIS — N2581 Secondary hyperparathyroidism of renal origin: Secondary | ICD-10-CM | POA: Diagnosis not present

## 2020-07-26 DIAGNOSIS — D631 Anemia in chronic kidney disease: Secondary | ICD-10-CM | POA: Diagnosis not present

## 2020-07-26 DIAGNOSIS — N2581 Secondary hyperparathyroidism of renal origin: Secondary | ICD-10-CM | POA: Diagnosis not present

## 2020-07-26 DIAGNOSIS — N186 End stage renal disease: Secondary | ICD-10-CM | POA: Diagnosis not present

## 2020-07-26 DIAGNOSIS — D689 Coagulation defect, unspecified: Secondary | ICD-10-CM | POA: Diagnosis not present

## 2020-07-26 DIAGNOSIS — Z992 Dependence on renal dialysis: Secondary | ICD-10-CM | POA: Diagnosis not present

## 2020-07-26 DIAGNOSIS — R110 Nausea: Secondary | ICD-10-CM | POA: Diagnosis not present

## 2020-07-26 DIAGNOSIS — D509 Iron deficiency anemia, unspecified: Secondary | ICD-10-CM | POA: Diagnosis not present

## 2020-07-28 ENCOUNTER — Telehealth (HOSPITAL_COMMUNITY): Payer: Self-pay

## 2020-07-28 ENCOUNTER — Other Ambulatory Visit (HOSPITAL_COMMUNITY): Payer: Self-pay | Admitting: Urology

## 2020-07-28 DIAGNOSIS — N35914 Unspecified anterior urethral stricture, male: Secondary | ICD-10-CM

## 2020-07-28 DIAGNOSIS — N2581 Secondary hyperparathyroidism of renal origin: Secondary | ICD-10-CM | POA: Diagnosis not present

## 2020-07-28 DIAGNOSIS — D689 Coagulation defect, unspecified: Secondary | ICD-10-CM | POA: Diagnosis not present

## 2020-07-28 DIAGNOSIS — N186 End stage renal disease: Secondary | ICD-10-CM | POA: Diagnosis not present

## 2020-07-28 DIAGNOSIS — R339 Retention of urine, unspecified: Secondary | ICD-10-CM

## 2020-07-28 DIAGNOSIS — Z992 Dependence on renal dialysis: Secondary | ICD-10-CM | POA: Diagnosis not present

## 2020-07-28 DIAGNOSIS — R110 Nausea: Secondary | ICD-10-CM | POA: Diagnosis not present

## 2020-07-28 DIAGNOSIS — D631 Anemia in chronic kidney disease: Secondary | ICD-10-CM | POA: Diagnosis not present

## 2020-07-28 DIAGNOSIS — D509 Iron deficiency anemia, unspecified: Secondary | ICD-10-CM | POA: Diagnosis not present

## 2020-07-28 NOTE — Telephone Encounter (Signed)
-----   Message from Jillyn Hidden sent at 07/28/2020  2:22 PM EDT ----- Regarding: FW: CT IMAGE GUIDED DRAINAGE / Suprapubic tube change  ----- Message ----- From: Arne Cleveland, MD Sent: 07/28/2020   2:04 PM EDT To: Jillyn Hidden Subject: RE: CT IMAGE GUIDED DRAINAGE / Suprapubic tu#  Ok  IR 4wk change/upsize  DDH   ----- Message ----- From: Garth Bigness D Sent: 07/28/2020   1:37 PM EDT To: Ir Procedure Requests Subject: CT IMAGE GUIDED DRAINAGE / Suprapubic tube c#  Procedure:    CT IMAGE GUIDED DRAINAGE / Suprapubic tube change  Reason:  Urinary retention, Anterior urethral stricture, monthly suprapubic change  History:  resent tube placement  Provider:  Ellison Hughs  Provider Contact:   [phone]

## 2020-07-30 DIAGNOSIS — D631 Anemia in chronic kidney disease: Secondary | ICD-10-CM | POA: Diagnosis not present

## 2020-07-30 DIAGNOSIS — N186 End stage renal disease: Secondary | ICD-10-CM | POA: Diagnosis not present

## 2020-07-30 DIAGNOSIS — D689 Coagulation defect, unspecified: Secondary | ICD-10-CM | POA: Diagnosis not present

## 2020-07-30 DIAGNOSIS — N2581 Secondary hyperparathyroidism of renal origin: Secondary | ICD-10-CM | POA: Diagnosis not present

## 2020-07-30 DIAGNOSIS — Z992 Dependence on renal dialysis: Secondary | ICD-10-CM | POA: Diagnosis not present

## 2020-07-30 DIAGNOSIS — D509 Iron deficiency anemia, unspecified: Secondary | ICD-10-CM | POA: Diagnosis not present

## 2020-07-30 DIAGNOSIS — R110 Nausea: Secondary | ICD-10-CM | POA: Diagnosis not present

## 2020-08-01 DIAGNOSIS — I129 Hypertensive chronic kidney disease with stage 1 through stage 4 chronic kidney disease, or unspecified chronic kidney disease: Secondary | ICD-10-CM | POA: Diagnosis not present

## 2020-08-01 DIAGNOSIS — Z992 Dependence on renal dialysis: Secondary | ICD-10-CM | POA: Diagnosis not present

## 2020-08-01 DIAGNOSIS — N186 End stage renal disease: Secondary | ICD-10-CM | POA: Diagnosis not present

## 2020-08-02 DIAGNOSIS — D689 Coagulation defect, unspecified: Secondary | ICD-10-CM | POA: Diagnosis not present

## 2020-08-02 DIAGNOSIS — N186 End stage renal disease: Secondary | ICD-10-CM | POA: Diagnosis not present

## 2020-08-02 DIAGNOSIS — D509 Iron deficiency anemia, unspecified: Secondary | ICD-10-CM | POA: Diagnosis not present

## 2020-08-02 DIAGNOSIS — N2581 Secondary hyperparathyroidism of renal origin: Secondary | ICD-10-CM | POA: Diagnosis not present

## 2020-08-02 DIAGNOSIS — Z992 Dependence on renal dialysis: Secondary | ICD-10-CM | POA: Diagnosis not present

## 2020-08-04 DIAGNOSIS — D689 Coagulation defect, unspecified: Secondary | ICD-10-CM | POA: Diagnosis not present

## 2020-08-04 DIAGNOSIS — N2581 Secondary hyperparathyroidism of renal origin: Secondary | ICD-10-CM | POA: Diagnosis not present

## 2020-08-04 DIAGNOSIS — Z992 Dependence on renal dialysis: Secondary | ICD-10-CM | POA: Diagnosis not present

## 2020-08-04 DIAGNOSIS — N186 End stage renal disease: Secondary | ICD-10-CM | POA: Diagnosis not present

## 2020-08-04 DIAGNOSIS — D509 Iron deficiency anemia, unspecified: Secondary | ICD-10-CM | POA: Diagnosis not present

## 2020-08-05 ENCOUNTER — Ambulatory Visit (HOSPITAL_COMMUNITY)
Admission: RE | Admit: 2020-08-05 | Discharge: 2020-08-05 | Disposition: A | Discharge status: Home / Self Care | Payer: Medicare Other | Source: Ambulatory Visit | Attending: Urology | Admitting: Urology

## 2020-08-05 ENCOUNTER — Other Ambulatory Visit: Payer: Self-pay

## 2020-08-05 DIAGNOSIS — R339 Retention of urine, unspecified: Secondary | ICD-10-CM | POA: Diagnosis not present

## 2020-08-05 DIAGNOSIS — R338 Other retention of urine: Secondary | ICD-10-CM | POA: Diagnosis not present

## 2020-08-05 DIAGNOSIS — Z435 Encounter for attention to cystostomy: Secondary | ICD-10-CM | POA: Insufficient documentation

## 2020-08-05 DIAGNOSIS — N35914 Unspecified anterior urethral stricture, male: Secondary | ICD-10-CM | POA: Diagnosis not present

## 2020-08-05 HISTORY — PX: IR CATHETER TUBE CHANGE: IMG717

## 2020-08-05 MED ORDER — IOHEXOL 300 MG/ML  SOLN
50.0000 mL | Freq: Once | INTRAMUSCULAR | Status: AC | PRN
  Administered 2020-08-05: 10 mL

## 2020-08-05 MED ORDER — LIDOCAINE HCL 1 % IJ SOLN
INTRAMUSCULAR | Status: AC
  Filled 2020-08-05: qty 20

## 2020-08-05 MED ORDER — LIDOCAINE HCL (PF) 1 % IJ SOLN
INTRAMUSCULAR | Status: AC
  Filled 2020-08-05: qty 30

## 2020-08-05 MED ORDER — LIDOCAINE HCL (PF) 1 % IJ SOLN
INTRAMUSCULAR | Status: DC | PRN
  Administered 2020-08-05: 2 mL

## 2020-08-06 DIAGNOSIS — N2581 Secondary hyperparathyroidism of renal origin: Secondary | ICD-10-CM | POA: Diagnosis not present

## 2020-08-06 DIAGNOSIS — D509 Iron deficiency anemia, unspecified: Secondary | ICD-10-CM | POA: Diagnosis not present

## 2020-08-06 DIAGNOSIS — Z992 Dependence on renal dialysis: Secondary | ICD-10-CM | POA: Diagnosis not present

## 2020-08-06 DIAGNOSIS — D689 Coagulation defect, unspecified: Secondary | ICD-10-CM | POA: Diagnosis not present

## 2020-08-06 DIAGNOSIS — N186 End stage renal disease: Secondary | ICD-10-CM | POA: Diagnosis not present

## 2020-08-09 DIAGNOSIS — D689 Coagulation defect, unspecified: Secondary | ICD-10-CM | POA: Diagnosis not present

## 2020-08-09 DIAGNOSIS — N2581 Secondary hyperparathyroidism of renal origin: Secondary | ICD-10-CM | POA: Diagnosis not present

## 2020-08-09 DIAGNOSIS — Z992 Dependence on renal dialysis: Secondary | ICD-10-CM | POA: Diagnosis not present

## 2020-08-09 DIAGNOSIS — N186 End stage renal disease: Secondary | ICD-10-CM | POA: Diagnosis not present

## 2020-08-09 DIAGNOSIS — D509 Iron deficiency anemia, unspecified: Secondary | ICD-10-CM | POA: Diagnosis not present

## 2020-08-11 DIAGNOSIS — N186 End stage renal disease: Secondary | ICD-10-CM | POA: Diagnosis not present

## 2020-08-11 DIAGNOSIS — Z992 Dependence on renal dialysis: Secondary | ICD-10-CM | POA: Diagnosis not present

## 2020-08-11 DIAGNOSIS — D509 Iron deficiency anemia, unspecified: Secondary | ICD-10-CM | POA: Diagnosis not present

## 2020-08-11 DIAGNOSIS — N2581 Secondary hyperparathyroidism of renal origin: Secondary | ICD-10-CM | POA: Diagnosis not present

## 2020-08-11 DIAGNOSIS — D689 Coagulation defect, unspecified: Secondary | ICD-10-CM | POA: Diagnosis not present

## 2020-08-13 DIAGNOSIS — D689 Coagulation defect, unspecified: Secondary | ICD-10-CM | POA: Diagnosis not present

## 2020-08-13 DIAGNOSIS — N186 End stage renal disease: Secondary | ICD-10-CM | POA: Diagnosis not present

## 2020-08-13 DIAGNOSIS — Z992 Dependence on renal dialysis: Secondary | ICD-10-CM | POA: Diagnosis not present

## 2020-08-13 DIAGNOSIS — D509 Iron deficiency anemia, unspecified: Secondary | ICD-10-CM | POA: Diagnosis not present

## 2020-08-13 DIAGNOSIS — N2581 Secondary hyperparathyroidism of renal origin: Secondary | ICD-10-CM | POA: Diagnosis not present

## 2020-08-16 DIAGNOSIS — N186 End stage renal disease: Secondary | ICD-10-CM | POA: Diagnosis not present

## 2020-08-16 DIAGNOSIS — Z992 Dependence on renal dialysis: Secondary | ICD-10-CM | POA: Diagnosis not present

## 2020-08-16 DIAGNOSIS — N2581 Secondary hyperparathyroidism of renal origin: Secondary | ICD-10-CM | POA: Diagnosis not present

## 2020-08-16 DIAGNOSIS — D509 Iron deficiency anemia, unspecified: Secondary | ICD-10-CM | POA: Diagnosis not present

## 2020-08-16 DIAGNOSIS — D689 Coagulation defect, unspecified: Secondary | ICD-10-CM | POA: Diagnosis not present

## 2020-08-18 DIAGNOSIS — Z992 Dependence on renal dialysis: Secondary | ICD-10-CM | POA: Diagnosis not present

## 2020-08-18 DIAGNOSIS — D509 Iron deficiency anemia, unspecified: Secondary | ICD-10-CM | POA: Diagnosis not present

## 2020-08-18 DIAGNOSIS — N2581 Secondary hyperparathyroidism of renal origin: Secondary | ICD-10-CM | POA: Diagnosis not present

## 2020-08-18 DIAGNOSIS — N186 End stage renal disease: Secondary | ICD-10-CM | POA: Diagnosis not present

## 2020-08-18 DIAGNOSIS — D689 Coagulation defect, unspecified: Secondary | ICD-10-CM | POA: Diagnosis not present

## 2020-08-20 DIAGNOSIS — N186 End stage renal disease: Secondary | ICD-10-CM | POA: Diagnosis not present

## 2020-08-20 DIAGNOSIS — Z992 Dependence on renal dialysis: Secondary | ICD-10-CM | POA: Diagnosis not present

## 2020-08-20 DIAGNOSIS — N2581 Secondary hyperparathyroidism of renal origin: Secondary | ICD-10-CM | POA: Diagnosis not present

## 2020-08-20 DIAGNOSIS — D689 Coagulation defect, unspecified: Secondary | ICD-10-CM | POA: Diagnosis not present

## 2020-08-20 DIAGNOSIS — D509 Iron deficiency anemia, unspecified: Secondary | ICD-10-CM | POA: Diagnosis not present

## 2020-08-23 DIAGNOSIS — D689 Coagulation defect, unspecified: Secondary | ICD-10-CM | POA: Diagnosis not present

## 2020-08-23 DIAGNOSIS — N186 End stage renal disease: Secondary | ICD-10-CM | POA: Diagnosis not present

## 2020-08-23 DIAGNOSIS — D509 Iron deficiency anemia, unspecified: Secondary | ICD-10-CM | POA: Diagnosis not present

## 2020-08-23 DIAGNOSIS — Z992 Dependence on renal dialysis: Secondary | ICD-10-CM | POA: Diagnosis not present

## 2020-08-23 DIAGNOSIS — N2581 Secondary hyperparathyroidism of renal origin: Secondary | ICD-10-CM | POA: Diagnosis not present

## 2020-08-25 DIAGNOSIS — D509 Iron deficiency anemia, unspecified: Secondary | ICD-10-CM | POA: Diagnosis not present

## 2020-08-25 DIAGNOSIS — D689 Coagulation defect, unspecified: Secondary | ICD-10-CM | POA: Diagnosis not present

## 2020-08-25 DIAGNOSIS — Z992 Dependence on renal dialysis: Secondary | ICD-10-CM | POA: Diagnosis not present

## 2020-08-25 DIAGNOSIS — N186 End stage renal disease: Secondary | ICD-10-CM | POA: Diagnosis not present

## 2020-08-25 DIAGNOSIS — N2581 Secondary hyperparathyroidism of renal origin: Secondary | ICD-10-CM | POA: Diagnosis not present

## 2020-08-27 DIAGNOSIS — N186 End stage renal disease: Secondary | ICD-10-CM | POA: Diagnosis not present

## 2020-08-27 DIAGNOSIS — D509 Iron deficiency anemia, unspecified: Secondary | ICD-10-CM | POA: Diagnosis not present

## 2020-08-27 DIAGNOSIS — Z992 Dependence on renal dialysis: Secondary | ICD-10-CM | POA: Diagnosis not present

## 2020-08-27 DIAGNOSIS — D689 Coagulation defect, unspecified: Secondary | ICD-10-CM | POA: Diagnosis not present

## 2020-08-27 DIAGNOSIS — N2581 Secondary hyperparathyroidism of renal origin: Secondary | ICD-10-CM | POA: Diagnosis not present

## 2020-08-28 DIAGNOSIS — I5020 Unspecified systolic (congestive) heart failure: Secondary | ICD-10-CM | POA: Diagnosis not present

## 2020-08-28 DIAGNOSIS — M109 Gout, unspecified: Secondary | ICD-10-CM | POA: Diagnosis not present

## 2020-08-28 DIAGNOSIS — N186 End stage renal disease: Secondary | ICD-10-CM | POA: Diagnosis not present

## 2020-08-28 DIAGNOSIS — J45909 Unspecified asthma, uncomplicated: Secondary | ICD-10-CM | POA: Diagnosis not present

## 2020-08-28 DIAGNOSIS — I110 Hypertensive heart disease with heart failure: Secondary | ICD-10-CM | POA: Diagnosis not present

## 2020-08-28 DIAGNOSIS — E785 Hyperlipidemia, unspecified: Secondary | ICD-10-CM | POA: Diagnosis not present

## 2020-08-28 DIAGNOSIS — Z992 Dependence on renal dialysis: Secondary | ICD-10-CM | POA: Diagnosis not present

## 2020-08-30 ENCOUNTER — Other Ambulatory Visit (HOSPITAL_BASED_OUTPATIENT_CLINIC_OR_DEPARTMENT_OTHER): Payer: Self-pay | Admitting: Nephrology

## 2020-08-30 DIAGNOSIS — D509 Iron deficiency anemia, unspecified: Secondary | ICD-10-CM | POA: Diagnosis not present

## 2020-08-30 DIAGNOSIS — D689 Coagulation defect, unspecified: Secondary | ICD-10-CM | POA: Diagnosis not present

## 2020-08-30 DIAGNOSIS — Z992 Dependence on renal dialysis: Secondary | ICD-10-CM | POA: Diagnosis not present

## 2020-08-30 DIAGNOSIS — N2581 Secondary hyperparathyroidism of renal origin: Secondary | ICD-10-CM | POA: Diagnosis not present

## 2020-08-30 DIAGNOSIS — N186 End stage renal disease: Secondary | ICD-10-CM | POA: Diagnosis not present

## 2020-08-31 ENCOUNTER — Other Ambulatory Visit: Payer: Self-pay | Admitting: Nephrology

## 2020-08-31 ENCOUNTER — Telehealth (HOSPITAL_COMMUNITY): Payer: Self-pay

## 2020-08-31 ENCOUNTER — Other Ambulatory Visit (HOSPITAL_COMMUNITY): Payer: Self-pay | Admitting: Nephrology

## 2020-08-31 ENCOUNTER — Other Ambulatory Visit (HOSPITAL_COMMUNITY): Payer: Self-pay | Admitting: Interventional Radiology

## 2020-08-31 DIAGNOSIS — R188 Other ascites: Secondary | ICD-10-CM

## 2020-08-31 DIAGNOSIS — N35914 Unspecified anterior urethral stricture, male: Secondary | ICD-10-CM

## 2020-08-31 DIAGNOSIS — R339 Retention of urine, unspecified: Secondary | ICD-10-CM

## 2020-08-31 NOTE — Telephone Encounter (Signed)
Called to schedule cath upsize, no answer, left vm. AW

## 2020-09-01 DIAGNOSIS — D689 Coagulation defect, unspecified: Secondary | ICD-10-CM | POA: Diagnosis not present

## 2020-09-01 DIAGNOSIS — N2581 Secondary hyperparathyroidism of renal origin: Secondary | ICD-10-CM | POA: Diagnosis not present

## 2020-09-01 DIAGNOSIS — D509 Iron deficiency anemia, unspecified: Secondary | ICD-10-CM | POA: Diagnosis not present

## 2020-09-01 DIAGNOSIS — I129 Hypertensive chronic kidney disease with stage 1 through stage 4 chronic kidney disease, or unspecified chronic kidney disease: Secondary | ICD-10-CM | POA: Diagnosis not present

## 2020-09-01 DIAGNOSIS — N186 End stage renal disease: Secondary | ICD-10-CM | POA: Diagnosis not present

## 2020-09-01 DIAGNOSIS — Z992 Dependence on renal dialysis: Secondary | ICD-10-CM | POA: Diagnosis not present

## 2020-09-02 ENCOUNTER — Other Ambulatory Visit: Payer: Self-pay | Admitting: Radiology

## 2020-09-03 DIAGNOSIS — N186 End stage renal disease: Secondary | ICD-10-CM | POA: Diagnosis not present

## 2020-09-03 DIAGNOSIS — Z23 Encounter for immunization: Secondary | ICD-10-CM | POA: Diagnosis not present

## 2020-09-03 DIAGNOSIS — D509 Iron deficiency anemia, unspecified: Secondary | ICD-10-CM | POA: Diagnosis not present

## 2020-09-03 DIAGNOSIS — Z992 Dependence on renal dialysis: Secondary | ICD-10-CM | POA: Diagnosis not present

## 2020-09-03 DIAGNOSIS — N2581 Secondary hyperparathyroidism of renal origin: Secondary | ICD-10-CM | POA: Diagnosis not present

## 2020-09-03 DIAGNOSIS — D689 Coagulation defect, unspecified: Secondary | ICD-10-CM | POA: Diagnosis not present

## 2020-09-06 DIAGNOSIS — Z992 Dependence on renal dialysis: Secondary | ICD-10-CM | POA: Diagnosis not present

## 2020-09-06 DIAGNOSIS — D689 Coagulation defect, unspecified: Secondary | ICD-10-CM | POA: Diagnosis not present

## 2020-09-06 DIAGNOSIS — D509 Iron deficiency anemia, unspecified: Secondary | ICD-10-CM | POA: Diagnosis not present

## 2020-09-06 DIAGNOSIS — Z23 Encounter for immunization: Secondary | ICD-10-CM | POA: Diagnosis not present

## 2020-09-06 DIAGNOSIS — N2581 Secondary hyperparathyroidism of renal origin: Secondary | ICD-10-CM | POA: Diagnosis not present

## 2020-09-06 DIAGNOSIS — N186 End stage renal disease: Secondary | ICD-10-CM | POA: Diagnosis not present

## 2020-09-07 ENCOUNTER — Other Ambulatory Visit: Payer: Self-pay

## 2020-09-07 ENCOUNTER — Encounter (HOSPITAL_COMMUNITY): Payer: Self-pay

## 2020-09-07 ENCOUNTER — Ambulatory Visit (HOSPITAL_COMMUNITY)
Admission: RE | Admit: 2020-09-07 | Discharge: 2020-09-07 | Disposition: A | Discharge status: Home / Self Care | Payer: Medicare Other | Source: Ambulatory Visit | Attending: Nephrology | Admitting: Nephrology

## 2020-09-07 ENCOUNTER — Ambulatory Visit (HOSPITAL_COMMUNITY)
Admission: RE | Admit: 2020-09-07 | Discharge: 2020-09-07 | Disposition: A | Discharge status: Home / Self Care | Payer: Medicare Other | Source: Ambulatory Visit | Attending: Interventional Radiology | Admitting: Interventional Radiology

## 2020-09-07 DIAGNOSIS — Z435 Encounter for attention to cystostomy: Secondary | ICD-10-CM | POA: Insufficient documentation

## 2020-09-07 DIAGNOSIS — R188 Other ascites: Secondary | ICD-10-CM | POA: Insufficient documentation

## 2020-09-07 DIAGNOSIS — N35914 Unspecified anterior urethral stricture, male: Secondary | ICD-10-CM | POA: Insufficient documentation

## 2020-09-07 DIAGNOSIS — R339 Retention of urine, unspecified: Secondary | ICD-10-CM | POA: Insufficient documentation

## 2020-09-07 DIAGNOSIS — N186 End stage renal disease: Secondary | ICD-10-CM | POA: Diagnosis not present

## 2020-09-07 DIAGNOSIS — N320 Bladder-neck obstruction: Secondary | ICD-10-CM | POA: Diagnosis not present

## 2020-09-07 HISTORY — PX: IR PARACENTESIS: IMG2679

## 2020-09-07 HISTORY — PX: IR CATHETER TUBE CHANGE: IMG717

## 2020-09-07 MED ORDER — IOHEXOL 240 MG/ML SOLN
50.0000 mL | Freq: Once | INTRAMUSCULAR | Status: DC
Start: 2020-09-07 — End: 2020-09-08

## 2020-09-07 MED ORDER — LIDOCAINE HCL (PF) 1 % IJ SOLN
INTRAMUSCULAR | Status: AC | PRN
  Administered 2020-09-07: 10 mL

## 2020-09-07 MED ORDER — SODIUM CHLORIDE 0.9 % IV SOLN: INTRAVENOUS | Status: DC

## 2020-09-07 MED ORDER — SODIUM CHLORIDE 0.9 % IV SOLN
INTRAVENOUS | Status: AC | PRN
  Administered 2020-09-07: 10 mL/h via INTRAVENOUS

## 2020-09-07 MED ORDER — LIDOCAINE HCL 1 % IJ SOLN
INTRAMUSCULAR | Status: AC
  Filled 2020-09-07: qty 20

## 2020-09-07 MED ORDER — LIDOCAINE HCL 1 % IJ SOLN
INTRAMUSCULAR | Status: DC | PRN
  Administered 2020-09-07: 10 mL

## 2020-09-07 MED ORDER — FENTANYL CITRATE (PF) 100 MCG/2ML IJ SOLN
INTRAMUSCULAR | Status: AC
  Filled 2020-09-07: qty 2

## 2020-09-07 MED ORDER — MIDAZOLAM HCL 2 MG/2ML IJ SOLN
INTRAMUSCULAR | Status: AC | PRN
  Administered 2020-09-07: 0.5 mg via INTRAVENOUS

## 2020-09-07 MED ORDER — FENTANYL CITRATE (PF) 100 MCG/2ML IJ SOLN
INTRAMUSCULAR | Status: AC | PRN
  Administered 2020-09-07 (×2): 25 ug via INTRAVENOUS

## 2020-09-07 MED ORDER — MIDAZOLAM HCL 2 MG/2ML IJ SOLN
INTRAMUSCULAR | Status: AC
  Filled 2020-09-07: qty 2

## 2020-09-07 NOTE — Sedation Documentation (Signed)
Taken to paracentesis bay in [facility] radiology for paracentesis

## 2020-09-07 NOTE — Procedures (Signed)
Ultrasound-guided diagnostic and therapeutic paracentesis performed yielding 5.7 liters of straw colored fluid.  No immediate complications. EBL is none.

## 2020-09-07 NOTE — Sedation Documentation (Addendum)
Discharge instructions reviewed re moderate sedation and supra pubic upsize catheter and paper copy of instructions given. Patient bladder spasms have resolved and not having pain. Paracentesis was completed before 1230.

## 2020-09-07 NOTE — Sedation Documentation (Signed)
[name], IR tech will be over to look at his supra pubic catheter.

## 2020-09-07 NOTE — Sedation Documentation (Signed)
Dr Earleen Newport busy so spoke to Dr Pascal Lux re flushing supra pubic catheter, he said to go ahead. Flushed with 20cc of NS easily and 20cc returned. Dr Pascal Lux aware of this. Feeling of bladder fullness and spasms resolving. Patient states does not make much urine and it is pinkish with some blood in it all the time. VS stable.

## 2020-09-07 NOTE — H&P (Signed)
Chief Complaint: Patient was seen in consultation today for upsize of supra pubic catheter and possible paracentesis at the request of Dr Phillis Knack  Supervising Physician: Corrie Mckusick  Patient Status: [facility] - Out-pt  History of Present Illness: Vernon Blackburn is a 55 y.o. male   ESRD Post TURP for BPH 05/2020 Difficulty with voiding/hematuria Urethral stricture Suprapubic catheter placed in IR 07/09/20 Upsized 8./4/22 Now here for upsize to 16 Fr per Dr Mir note  Pt is also scheduled for paracentesis 9/7 He is requesting para today if can--- for convenience    Past Medical History:  Diagnosis Date   ADHD    Anemia, chronic renal failure    Asthma    followed by pcp   Benign localized prostatic hyperplasia with lower urinary tract symptoms (LUTS)    Bladder stones    BPH (benign prostatic hyperplasia)    Chronic gout    05-06-2020  per pt last episode 6 months ago,  (involves right knee and right great toe)   Chronic systolic HF (heart failure) ([place])    followed by cardiology, dr Aundra Dubin @ CHF clinic,   last TEE 03-06-2019 in epic ef 45%   Dyspnea    ESRD on hemodialysis [facility])    primary nephrologist--- dr Moshe Cipro--- HD on M/W/ F  at [place] at [facility] in [place] murmur    History of bladder stone    History of cellulitis 01/27/2020   ED visit ,  abdominal wall   History of CVA (cerebrovascular accident) without residual deficits    remote infarct per imaging MRI brain in care every where 02-07-2019   History of kidney stones    History of obstructive sleep apnea    05-06-2020  per pt test positive for osa prior to gastric bypass in 2014 used cpap,  pt stated was retested after alot of weight loss , test was negative    Hypertension    followed by cardiology   NICM (nonischemic cardiomyopathy) ([place])    followed by cardiology---- 01/ 2018  ef 20-35%, severe MV stenosis;  last TEE echo in epic 03-06-2019  ef 45%   Peripheral edema     Bilateral lower extremity    S/P minimally invasive mitral valve repair followed by cardiology   06-29-2016  for severe stenosis ;  30 mm [name] via right mini thoracotomy approach;  last TEE echo in epic 03-06-2019 ef 45%, mild LVH, post op mild MR with mean gradient 53mmHg and no significant stenosis   Seasonal and perennial allergic rhinitis    Sleep apnea     Past Surgical History:  Procedure Laterality Date   AV FISTULA PLACEMENT Left 03/10/2019   Procedure: LEFT ARM BASILIC ARTERIOVENOUS (AV) FISTULA CREATION FIRST STAGE;  Surgeon: Marty Heck, MD;  Location: [place];  Service: Vascular;  Laterality: Left;   BASCILIC VEIN TRANSPOSITION Left 05/01/2019   Procedure: LEFT SECOND STAGE [place].;  Surgeon: Marty Heck, MD;  Location: [place];  Service: Vascular;  Laterality: Left;   CYSTOSCOPY/URETEROSCOPY/HOLMIUM LASER/STENT PLACEMENT Right 07/17/2018   Procedure: CYSTOSCOPY/RETROGRADE/URETEROSCOPY/HOLMIUM LASER/STENT PLACEMENT/ CYSTOLITHALOPAXY;  Surgeon: Ceasar Mons, MD;  Location: [facility];  Service: Urology;  Laterality: Right;   GASTRIC BYPASS  01/2012   IR CATHETER TUBE CHANGE  08/05/2020   IR PARACENTESIS  07/13/2020   KNEE ARTHROSCOPY W/ MENISCAL REPAIR Right 05/2008   LAPAROSCOPIC INGUINAL HERNIA REPAIR Left 03-24-2015  @ [facility]  MITRAL VALVE REPAIR Right 06/29/2016   Procedure: MINIMALLY INVASIVE MITRAL VALVE REPAIR  (MVR);  Surgeon: [name], MD;  Location: [place];  Service: Open Heart Surgery;  Laterality: Right;   PERITONEAL CATHETER INSERTION  08-18-2019  @[facility]    AND OPEN RIGHT INGUINAL HERNIA REPAIR   PERITONEAL CATHETER REMOVAL  03-31-2020  @[facility]    RIGHT/LEFT HEART CATH AND CORONARY ANGIOGRAPHY N/A 06/07/2016   Procedure: Right/Left Heart Cath and Coronary Angiography;  Surgeon: Larey Dresser, MD;  Location: [place] CV LAB;  Service: Cardiovascular;  Laterality: N/A;   TEE WITHOUT CARDIOVERSION N/A  05/04/2016   Procedure: TRANSESOPHAGEAL ECHOCARDIOGRAM (TEE);  Surgeon: Larey Dresser, MD;  Location: [facility] ENDOSCOPY;  Service: Cardiovascular;  Laterality: N/A;   TEE WITHOUT CARDIOVERSION N/A 06/29/2016   Procedure: TRANSESOPHAGEAL ECHOCARDIOGRAM (TEE);  Surgeon: [name], MD;  Location: [place];  Service: Open Heart Surgery;  Laterality: N/A;   TEE WITHOUT CARDIOVERSION N/A 03/06/2019   Procedure: TRANSESOPHAGEAL ECHOCARDIOGRAM (TEE);  Surgeon: Larey Dresser, MD;  Location: [facility] ENDOSCOPY;  Service: Cardiovascular;  Laterality: N/A;   TRANSURETHRAL RESECTION OF PROSTATE N/A 05/25/2020   Procedure: TRANSURETHRAL RESECTION OF THE PROSTATE (TURP) WITH CYSTOLITHOLAPAXY;  Surgeon: Ceasar Mons, MD;  [facility];  Service: Urology;  Laterality: N/A;   UMBILICAL HERNIA REPAIR  09-05-2012  @[facility]     Allergies: Isosorbide  Medications: Prior to Admission medications   Medication Sig Start Date End Date Taking? Authorizing Provider  Albuterol Sulfate (PROAIR RESPICLICK) 858 (90 Base) MCG/ACT AEPB Inhale 2 puffs into the lungs every 6 (six) hours as needed (wheezing/shortness of breath).   Yes [provider]  atorvastatin (LIPITOR) 20 MG tablet Take 20 mg by mouth daily. 04/09/20  Yes [provider]  B Complex-C-Zn-Folic Acid (DIALYVITE 850 WITH ZINC) 0.8 MG TABS Take 1 tablet by mouth daily. 07/27/20  Yes [provider]  calcitRIOL (ROCALTROL) 0.5 MCG capsule Take 0.5 mcg by mouth every Monday, Wednesday, and Friday. 04/28/20  Yes [provider]  calcium acetate (PHOSLO) 667 MG capsule Take 2,668 mg by mouth 3 (three) times daily with meals. 11/22/19  Yes [provider]  carvedilol (COREG) 6.25 MG tablet Take 1 tablet (6.25 mg total) by mouth daily at 6 (six) AM. Patient taking differently: Take 6.25 mg by mouth daily. 07/20/20  Yes Larey Dresser, MD  Febuxostat 80 MG TABS Take 80 mg by mouth daily.   Yes [provider]   fexofenadine (ALLEGRA) 180 MG tablet Take 180 mg by mouth daily as needed for allergies.   Yes [provider]  furosemide (LASIX) 80 MG tablet Take 80 mg by mouth 2 (two) times daily. 06/30/20  Yes [provider]  lactulose (CHRONULAC) 10 GM/15ML solution Take 10 g by mouth 2 (two) times daily as needed for mild constipation.   Yes [provider]  polyethylene glycol (MIRALAX / GLYCOLAX) 17 g packet Take 17 g by mouth daily as needed.   Yes [provider]  sildenafil (REVATIO) 20 MG tablet Take 20 mg by mouth daily as needed (erectile dysfunction). 12/30/18  Yes [provider]  tamsulosin (FLOMAX) 0.4 MG CAPS capsule Take 0.4 mg by mouth in the morning.   Yes [provider]  VYVANSE 30 MG capsule Take 30 mg by mouth every morning. 06/21/19  Yes [provider]  Budeson-Glycopyrrol-Formoterol (BREZTRI AEROSPHERE) 160-9-4.8 MCG/ACT AERO Inhale 2 puffs into the lungs 2 (two) times daily as needed (congestion/respiratory issues.).  [provider]  fluocinonide ointment (LIDEX) 6.14 % Apply 1 application topically 2 (two) times daily as needed (eczema). (Hands) 04/03/20   [provider]  pimecrolimus (ELIDEL) 1 % cream Apply topically 2 (two) times daily. Patient taking differently: Apply 1 application topically 2 (two) times daily as needed (eczema). 02/03/20   Valentina Shaggy, MD  tacrolimus (PROTOPIC) 0.1 % ointment Apply 1 application topically 2 (two) times daily as needed (eczema). (eyelids/hands) 04/30/20   [provider]     Family History  Problem Relation Age of Onset   Diabetes Mother    Hypertension Mother    Heart failure Mother    Colon cancer Neg Hx    Esophageal cancer Neg Hx    Rectal cancer Neg Hx    Stomach cancer Neg Hx     Social History   Socioeconomic History   Marital status: Married    Spouse name: Not on file   Number of children: Not on file   Years of education:  Not on file   Highest education level: Not on file  Occupational History   Not on file  Tobacco Use   Smoking status: Never   Smokeless tobacco: Never  Vaping Use   Vaping Use: Never used  Substance and Sexual Activity   Alcohol use: Not Currently   Drug use: Never   Sexual activity: Not on file  Other Topics Concern   Not on file  Social History Narrative   Not on file   Social Determinants of Health   Financial Resource Strain: Not on file  Food Insecurity: Not on file  Transportation Needs: Not on file  Physical Activity: Not on file  Stress: Not on file  Social Connections: Not on file     Review of Systems: A 12 point ROS discussed and pertinent positives are indicated in the HPI above.  All other systems are negative.  Review of Systems  Constitutional:  Negative for fever.  Cardiovascular:  Negative for chest pain.  Gastrointestinal:  Positive for abdominal distention and abdominal pain.  Psychiatric/Behavioral:  Negative for behavioral problems and confusion.    Vital Signs: BP 110/86   Pulse 78   Temp 97.8 F (36.6 C) (Oral)   Resp 16   Ht 5\' 10"  (1.778 m)   Wt 165 lb (74.8 kg)   SpO2 100%   BMI 23.68 kg/m   Physical Exam Vitals reviewed.  HENT:     Mouth/Throat:     Mouth: Mucous membranes are moist.  Cardiovascular:     Rate and Rhythm: Normal rate and regular rhythm.     Heart sounds: Normal heart sounds.  Pulmonary:     Effort: Pulmonary effort is normal.     Breath sounds: Normal breath sounds.  Abdominal:     General: There is distension.  Musculoskeletal:        General: Normal range of motion.  Skin:    General: Skin is warm.  Neurological:     Mental Status: He is alert and oriented to person, place, and time.  Psychiatric:        Behavior: Behavior normal.    Imaging: No results found.  Labs:  CBC: Recent Labs    07/12/20 2033 07/13/20 0421 07/14/20 0303 07/15/20 0306  WBC 4.3 4.2 4.5 4.5  HGB 12.3* 11.5* 9.7*  10.4*  HCT 39.9 36.7* 30.9* 33.3*  PLT 214 217 211 229    COAGS: Recent Labs    05/25/20 1612 07/12/20 2033  INR 1.0 1.1    BMP: Recent Labs    07/12/20 2033 07/13/20 0421 07/14/20 0303 07/15/20 0306  NA 140 140 139 141  K 3.7 3.1* 3.4* 3.8  CL 95* 96* 98 101  CO2 33* 34* 35* 33*  GLUCOSE 82 82 84 76  BUN 16 14 21* 13  CALCIUM 8.4* 8.3* 8.3* 8.5*  CREATININE 7.13* 7.76* 9.68* 7.26*  GFRNONAA 8* 8* 6* 8*    LIVER FUNCTION TESTS: Recent Labs    01/22/20 2109 06/24/20 1247 07/12/20 2033 07/13/20 0421 07/14/20 0303  BILITOT 0.6 0.7 0.5 0.8  --   AST 36 21 27 22   --   ALT 52* 16 16 13   --   ALKPHOS 39 44 46 41  --   PROT 5.6* 5.8* 6.5 5.5*  --   ALBUMIN 3.1* 3.1* 3.5 2.9* 2.8*    TUMOR MARKERS: No results for input(s): AFPTM, CEA, CA199, CHROMGRNA in the last 8760 hours.  Assessment and Plan:  Urethral stricture Suprapubic catheter placed 07/2020 Upsized once 08/05/20 Now for further upsize today in IR Also scheduled for paracentesis 9/7---- will try to accommodate today Pt is aware of procedure benefits and risks including but not limited tio Infection;; bleeding; damage to surrounding structures He is agreeable to proceed Consent signed and in chart  Thank you for this interesting consult.  I greatly enjoyed meeting Vernon Blackburn and look forward to participating in their care.  A copy of this report was sent to the requesting provider on this date.  Electronically Signed: Lavonia Drafts, PA-C 09/07/2020, 8:47 AM   I spent a total of    25 Minutes in face to face in clinical consultation, greater than 50% of which was counseling/coordinating care for suprapubic cath upsize and paracentesis

## 2020-09-07 NOTE — Sedation Documentation (Signed)
Patient in Radiology nurses station bay awaiting paracentesis. Has call bell and stretcher lowest level.

## 2020-09-07 NOTE — Procedures (Signed)
Interventional Radiology Procedure Note  Procedure: Image guided exchange and upsize of supra-pubic catheter. 48F --> 36F thal.  The balloon foley wound not track through the soft tissues.  Complications: None  Recommendations: - Routine care - 2-3 months can schedule for image guided exchange to balloon foley.  Would perform this under fluoro for the first given the density of the supra-pubic soft tissues.   Signed,  Dulcy Fanny. Earleen Newport, DO

## 2020-09-08 ENCOUNTER — Encounter (HOSPITAL_COMMUNITY): Payer: Self-pay

## 2020-09-08 ENCOUNTER — Ambulatory Visit (HOSPITAL_COMMUNITY): Payer: Medicare Other

## 2020-09-08 ENCOUNTER — Ambulatory Visit (HOSPITAL_COMMUNITY): Admission: RE | Admit: 2020-09-08 | Payer: Medicare Other | Source: Ambulatory Visit

## 2020-09-08 DIAGNOSIS — D509 Iron deficiency anemia, unspecified: Secondary | ICD-10-CM | POA: Diagnosis not present

## 2020-09-08 DIAGNOSIS — D689 Coagulation defect, unspecified: Secondary | ICD-10-CM | POA: Diagnosis not present

## 2020-09-08 DIAGNOSIS — Z992 Dependence on renal dialysis: Secondary | ICD-10-CM | POA: Diagnosis not present

## 2020-09-08 DIAGNOSIS — N2581 Secondary hyperparathyroidism of renal origin: Secondary | ICD-10-CM | POA: Diagnosis not present

## 2020-09-08 DIAGNOSIS — N186 End stage renal disease: Secondary | ICD-10-CM | POA: Diagnosis not present

## 2020-09-08 DIAGNOSIS — Z23 Encounter for immunization: Secondary | ICD-10-CM | POA: Diagnosis not present

## 2020-09-10 DIAGNOSIS — D509 Iron deficiency anemia, unspecified: Secondary | ICD-10-CM | POA: Diagnosis not present

## 2020-09-10 DIAGNOSIS — Z23 Encounter for immunization: Secondary | ICD-10-CM | POA: Diagnosis not present

## 2020-09-10 DIAGNOSIS — Z992 Dependence on renal dialysis: Secondary | ICD-10-CM | POA: Diagnosis not present

## 2020-09-10 DIAGNOSIS — D689 Coagulation defect, unspecified: Secondary | ICD-10-CM | POA: Diagnosis not present

## 2020-09-10 DIAGNOSIS — N186 End stage renal disease: Secondary | ICD-10-CM | POA: Diagnosis not present

## 2020-09-10 DIAGNOSIS — N2581 Secondary hyperparathyroidism of renal origin: Secondary | ICD-10-CM | POA: Diagnosis not present

## 2020-09-13 DIAGNOSIS — Z23 Encounter for immunization: Secondary | ICD-10-CM | POA: Diagnosis not present

## 2020-09-13 DIAGNOSIS — D509 Iron deficiency anemia, unspecified: Secondary | ICD-10-CM | POA: Diagnosis not present

## 2020-09-13 DIAGNOSIS — D689 Coagulation defect, unspecified: Secondary | ICD-10-CM | POA: Diagnosis not present

## 2020-09-13 DIAGNOSIS — N2581 Secondary hyperparathyroidism of renal origin: Secondary | ICD-10-CM | POA: Diagnosis not present

## 2020-09-13 DIAGNOSIS — Z992 Dependence on renal dialysis: Secondary | ICD-10-CM | POA: Diagnosis not present

## 2020-09-13 DIAGNOSIS — N186 End stage renal disease: Secondary | ICD-10-CM | POA: Diagnosis not present

## 2020-09-14 ENCOUNTER — Other Ambulatory Visit: Payer: Self-pay | Admitting: Nephrology

## 2020-09-14 DIAGNOSIS — R188 Other ascites: Secondary | ICD-10-CM

## 2020-09-15 ENCOUNTER — Other Ambulatory Visit: Payer: Self-pay

## 2020-09-15 ENCOUNTER — Ambulatory Visit (HOSPITAL_COMMUNITY)
Admission: RE | Admit: 2020-09-15 | Discharge: 2020-09-15 | Disposition: A | Discharge status: Home / Self Care | Payer: Medicare Other | Source: Ambulatory Visit | Attending: Nephrology | Admitting: Nephrology

## 2020-09-15 ENCOUNTER — Ambulatory Visit (HOSPITAL_COMMUNITY): Payer: Medicare Other

## 2020-09-15 DIAGNOSIS — R188 Other ascites: Secondary | ICD-10-CM | POA: Insufficient documentation

## 2020-09-15 HISTORY — PX: IR PARACENTESIS: IMG2679

## 2020-09-15 MED ORDER — LIDOCAINE HCL 1 % IJ SOLN
INTRAMUSCULAR | Status: AC
  Filled 2020-09-15: qty 20

## 2020-09-15 MED ORDER — LIDOCAINE HCL (PF) 1 % IJ SOLN
INTRAMUSCULAR | Status: DC | PRN
  Administered 2020-09-15: 10 mL

## 2020-09-15 NOTE — Procedures (Signed)
PROCEDURE SUMMARY:  Successful US guided paracentesis from left lower abdomen.  Yielded 3.5 L of clear, yellow fluid.  No immediate complications.  Pt tolerated well.   Specimen not sent for labs.  EBL < 63mL  Tyson Alias, NP 09/15/2020 8:27 AM

## 2020-09-16 DIAGNOSIS — Z992 Dependence on renal dialysis: Secondary | ICD-10-CM | POA: Diagnosis not present

## 2020-09-16 DIAGNOSIS — N2581 Secondary hyperparathyroidism of renal origin: Secondary | ICD-10-CM | POA: Diagnosis not present

## 2020-09-16 DIAGNOSIS — N186 End stage renal disease: Secondary | ICD-10-CM | POA: Diagnosis not present

## 2020-09-16 DIAGNOSIS — Z23 Encounter for immunization: Secondary | ICD-10-CM | POA: Diagnosis not present

## 2020-09-16 DIAGNOSIS — D689 Coagulation defect, unspecified: Secondary | ICD-10-CM | POA: Diagnosis not present

## 2020-09-16 DIAGNOSIS — D509 Iron deficiency anemia, unspecified: Secondary | ICD-10-CM | POA: Diagnosis not present

## 2020-09-17 DIAGNOSIS — D689 Coagulation defect, unspecified: Secondary | ICD-10-CM | POA: Diagnosis not present

## 2020-09-17 DIAGNOSIS — N186 End stage renal disease: Secondary | ICD-10-CM | POA: Diagnosis not present

## 2020-09-17 DIAGNOSIS — Z992 Dependence on renal dialysis: Secondary | ICD-10-CM | POA: Diagnosis not present

## 2020-09-17 DIAGNOSIS — Z23 Encounter for immunization: Secondary | ICD-10-CM | POA: Diagnosis not present

## 2020-09-17 DIAGNOSIS — N2581 Secondary hyperparathyroidism of renal origin: Secondary | ICD-10-CM | POA: Diagnosis not present

## 2020-09-17 DIAGNOSIS — D509 Iron deficiency anemia, unspecified: Secondary | ICD-10-CM | POA: Diagnosis not present

## 2020-09-21 DIAGNOSIS — N186 End stage renal disease: Secondary | ICD-10-CM | POA: Diagnosis not present

## 2020-09-21 DIAGNOSIS — Z992 Dependence on renal dialysis: Secondary | ICD-10-CM | POA: Diagnosis not present

## 2020-09-21 DIAGNOSIS — N2581 Secondary hyperparathyroidism of renal origin: Secondary | ICD-10-CM | POA: Diagnosis not present

## 2020-09-21 DIAGNOSIS — D689 Coagulation defect, unspecified: Secondary | ICD-10-CM | POA: Diagnosis not present

## 2020-09-21 DIAGNOSIS — D509 Iron deficiency anemia, unspecified: Secondary | ICD-10-CM | POA: Diagnosis not present

## 2020-09-21 DIAGNOSIS — Z23 Encounter for immunization: Secondary | ICD-10-CM | POA: Diagnosis not present

## 2020-09-22 DIAGNOSIS — Z992 Dependence on renal dialysis: Secondary | ICD-10-CM | POA: Diagnosis not present

## 2020-09-22 DIAGNOSIS — Z23 Encounter for immunization: Secondary | ICD-10-CM | POA: Diagnosis not present

## 2020-09-22 DIAGNOSIS — N2581 Secondary hyperparathyroidism of renal origin: Secondary | ICD-10-CM | POA: Diagnosis not present

## 2020-09-22 DIAGNOSIS — N186 End stage renal disease: Secondary | ICD-10-CM | POA: Diagnosis not present

## 2020-09-22 DIAGNOSIS — D509 Iron deficiency anemia, unspecified: Secondary | ICD-10-CM | POA: Diagnosis not present

## 2020-09-22 DIAGNOSIS — D689 Coagulation defect, unspecified: Secondary | ICD-10-CM | POA: Diagnosis not present

## 2020-09-24 DIAGNOSIS — N186 End stage renal disease: Secondary | ICD-10-CM | POA: Diagnosis not present

## 2020-09-24 DIAGNOSIS — N2581 Secondary hyperparathyroidism of renal origin: Secondary | ICD-10-CM | POA: Diagnosis not present

## 2020-09-24 DIAGNOSIS — Z992 Dependence on renal dialysis: Secondary | ICD-10-CM | POA: Diagnosis not present

## 2020-09-24 DIAGNOSIS — D689 Coagulation defect, unspecified: Secondary | ICD-10-CM | POA: Diagnosis not present

## 2020-09-24 DIAGNOSIS — Z23 Encounter for immunization: Secondary | ICD-10-CM | POA: Diagnosis not present

## 2020-09-24 DIAGNOSIS — D509 Iron deficiency anemia, unspecified: Secondary | ICD-10-CM | POA: Diagnosis not present

## 2020-09-27 DIAGNOSIS — Z992 Dependence on renal dialysis: Secondary | ICD-10-CM | POA: Diagnosis not present

## 2020-09-27 DIAGNOSIS — D509 Iron deficiency anemia, unspecified: Secondary | ICD-10-CM | POA: Diagnosis not present

## 2020-09-27 DIAGNOSIS — N2581 Secondary hyperparathyroidism of renal origin: Secondary | ICD-10-CM | POA: Diagnosis not present

## 2020-09-27 DIAGNOSIS — D689 Coagulation defect, unspecified: Secondary | ICD-10-CM | POA: Diagnosis not present

## 2020-09-27 DIAGNOSIS — N186 End stage renal disease: Secondary | ICD-10-CM | POA: Diagnosis not present

## 2020-09-27 DIAGNOSIS — Z23 Encounter for immunization: Secondary | ICD-10-CM | POA: Diagnosis not present

## 2020-09-29 ENCOUNTER — Other Ambulatory Visit (HOSPITAL_COMMUNITY): Payer: Self-pay | Admitting: Urology

## 2020-09-29 DIAGNOSIS — N35914 Unspecified anterior urethral stricture, male: Secondary | ICD-10-CM

## 2020-09-29 DIAGNOSIS — N186 End stage renal disease: Secondary | ICD-10-CM | POA: Diagnosis not present

## 2020-09-29 DIAGNOSIS — D689 Coagulation defect, unspecified: Secondary | ICD-10-CM | POA: Diagnosis not present

## 2020-09-29 DIAGNOSIS — N2581 Secondary hyperparathyroidism of renal origin: Secondary | ICD-10-CM | POA: Diagnosis not present

## 2020-09-29 DIAGNOSIS — D509 Iron deficiency anemia, unspecified: Secondary | ICD-10-CM | POA: Diagnosis not present

## 2020-09-29 DIAGNOSIS — Z23 Encounter for immunization: Secondary | ICD-10-CM | POA: Diagnosis not present

## 2020-09-29 DIAGNOSIS — Z992 Dependence on renal dialysis: Secondary | ICD-10-CM | POA: Diagnosis not present

## 2020-10-01 DIAGNOSIS — N186 End stage renal disease: Secondary | ICD-10-CM | POA: Diagnosis not present

## 2020-10-01 DIAGNOSIS — D509 Iron deficiency anemia, unspecified: Secondary | ICD-10-CM | POA: Diagnosis not present

## 2020-10-01 DIAGNOSIS — N2581 Secondary hyperparathyroidism of renal origin: Secondary | ICD-10-CM | POA: Diagnosis not present

## 2020-10-01 DIAGNOSIS — I129 Hypertensive chronic kidney disease with stage 1 through stage 4 chronic kidney disease, or unspecified chronic kidney disease: Secondary | ICD-10-CM | POA: Diagnosis not present

## 2020-10-01 DIAGNOSIS — D689 Coagulation defect, unspecified: Secondary | ICD-10-CM | POA: Diagnosis not present

## 2020-10-01 DIAGNOSIS — Z992 Dependence on renal dialysis: Secondary | ICD-10-CM | POA: Diagnosis not present

## 2020-10-01 DIAGNOSIS — Z23 Encounter for immunization: Secondary | ICD-10-CM | POA: Diagnosis not present

## 2020-10-04 DIAGNOSIS — N2581 Secondary hyperparathyroidism of renal origin: Secondary | ICD-10-CM | POA: Diagnosis not present

## 2020-10-04 DIAGNOSIS — N186 End stage renal disease: Secondary | ICD-10-CM | POA: Diagnosis not present

## 2020-10-04 DIAGNOSIS — D689 Coagulation defect, unspecified: Secondary | ICD-10-CM | POA: Diagnosis not present

## 2020-10-04 DIAGNOSIS — D509 Iron deficiency anemia, unspecified: Secondary | ICD-10-CM | POA: Diagnosis not present

## 2020-10-04 DIAGNOSIS — Z992 Dependence on renal dialysis: Secondary | ICD-10-CM | POA: Diagnosis not present

## 2020-10-05 ENCOUNTER — Other Ambulatory Visit: Payer: Self-pay

## 2020-10-05 ENCOUNTER — Ambulatory Visit (HOSPITAL_COMMUNITY)
Admission: RE | Admit: 2020-10-05 | Discharge: 2020-10-05 | Disposition: A | Discharge status: Home / Self Care | Payer: Medicare Other | Source: Ambulatory Visit | Attending: Urology | Admitting: Urology

## 2020-10-05 DIAGNOSIS — Z435 Encounter for attention to cystostomy: Secondary | ICD-10-CM | POA: Diagnosis not present

## 2020-10-05 DIAGNOSIS — N35914 Unspecified anterior urethral stricture, male: Secondary | ICD-10-CM | POA: Insufficient documentation

## 2020-10-05 DIAGNOSIS — N320 Bladder-neck obstruction: Secondary | ICD-10-CM | POA: Diagnosis not present

## 2020-10-05 HISTORY — PX: IR CATHETER TUBE CHANGE: IMG717

## 2020-10-05 MED ORDER — IOHEXOL 350 MG/ML SOLN: 100.0000 mL | Freq: Once | INTRAVENOUS | Status: DC | PRN

## 2020-10-05 MED ORDER — LIDOCAINE HCL 1 % IJ SOLN
INTRAMUSCULAR | Status: AC
  Administered 2020-10-05: 10 mL via INTRALESIONAL
  Filled 2020-10-05: qty 20

## 2020-10-06 DIAGNOSIS — Z992 Dependence on renal dialysis: Secondary | ICD-10-CM | POA: Diagnosis not present

## 2020-10-06 DIAGNOSIS — N186 End stage renal disease: Secondary | ICD-10-CM | POA: Diagnosis not present

## 2020-10-06 DIAGNOSIS — D509 Iron deficiency anemia, unspecified: Secondary | ICD-10-CM | POA: Diagnosis not present

## 2020-10-06 DIAGNOSIS — D689 Coagulation defect, unspecified: Secondary | ICD-10-CM | POA: Diagnosis not present

## 2020-10-06 DIAGNOSIS — N2581 Secondary hyperparathyroidism of renal origin: Secondary | ICD-10-CM | POA: Diagnosis not present

## 2020-10-08 ENCOUNTER — Other Ambulatory Visit (HOSPITAL_BASED_OUTPATIENT_CLINIC_OR_DEPARTMENT_OTHER): Payer: Self-pay | Admitting: Nephrology

## 2020-10-08 ENCOUNTER — Other Ambulatory Visit: Payer: Self-pay | Admitting: Nephrology

## 2020-10-08 ENCOUNTER — Other Ambulatory Visit (HOSPITAL_COMMUNITY): Payer: Self-pay | Admitting: Nephrology

## 2020-10-08 DIAGNOSIS — D509 Iron deficiency anemia, unspecified: Secondary | ICD-10-CM | POA: Diagnosis not present

## 2020-10-08 DIAGNOSIS — N186 End stage renal disease: Secondary | ICD-10-CM | POA: Diagnosis not present

## 2020-10-08 DIAGNOSIS — Z992 Dependence on renal dialysis: Secondary | ICD-10-CM | POA: Diagnosis not present

## 2020-10-08 DIAGNOSIS — D689 Coagulation defect, unspecified: Secondary | ICD-10-CM | POA: Diagnosis not present

## 2020-10-08 DIAGNOSIS — R188 Other ascites: Secondary | ICD-10-CM

## 2020-10-08 DIAGNOSIS — N2581 Secondary hyperparathyroidism of renal origin: Secondary | ICD-10-CM | POA: Diagnosis not present

## 2020-10-11 ENCOUNTER — Ambulatory Visit (HOSPITAL_COMMUNITY)
Admission: RE | Admit: 2020-10-11 | Discharge: 2020-10-11 | Disposition: A | Discharge status: Home / Self Care | Payer: Medicare Other | Source: Ambulatory Visit | Attending: Cardiology | Admitting: Cardiology

## 2020-10-11 ENCOUNTER — Ambulatory Visit (HOSPITAL_COMMUNITY)
Admission: RE | Admit: 2020-10-11 | Discharge: 2020-10-11 | Disposition: A | Discharge status: Home / Self Care | Payer: Medicare Other | Source: Ambulatory Visit | Attending: Nephrology | Admitting: Nephrology

## 2020-10-11 ENCOUNTER — Encounter (HOSPITAL_COMMUNITY): Payer: Self-pay | Admitting: Cardiology

## 2020-10-11 ENCOUNTER — Other Ambulatory Visit: Payer: Self-pay

## 2020-10-11 VITALS — BP 100/60 | HR 81 | Wt 166.0 lb

## 2020-10-11 DIAGNOSIS — Z79899 Other long term (current) drug therapy: Secondary | ICD-10-CM | POA: Insufficient documentation

## 2020-10-11 DIAGNOSIS — R188 Other ascites: Secondary | ICD-10-CM | POA: Insufficient documentation

## 2020-10-11 DIAGNOSIS — N186 End stage renal disease: Secondary | ICD-10-CM | POA: Insufficient documentation

## 2020-10-11 DIAGNOSIS — J45909 Unspecified asthma, uncomplicated: Secondary | ICD-10-CM | POA: Insufficient documentation

## 2020-10-11 DIAGNOSIS — I5032 Chronic diastolic (congestive) heart failure: Secondary | ICD-10-CM

## 2020-10-11 DIAGNOSIS — Z992 Dependence on renal dialysis: Secondary | ICD-10-CM | POA: Insufficient documentation

## 2020-10-11 DIAGNOSIS — Z952 Presence of prosthetic heart valve: Secondary | ICD-10-CM | POA: Diagnosis not present

## 2020-10-11 DIAGNOSIS — Z7951 Long term (current) use of inhaled steroids: Secondary | ICD-10-CM | POA: Diagnosis not present

## 2020-10-11 DIAGNOSIS — Z8673 Personal history of transient ischemic attack (TIA), and cerebral infarction without residual deficits: Secondary | ICD-10-CM | POA: Diagnosis not present

## 2020-10-11 DIAGNOSIS — Z960 Presence of urogenital implants: Secondary | ICD-10-CM | POA: Diagnosis not present

## 2020-10-11 DIAGNOSIS — Z8249 Family history of ischemic heart disease and other diseases of the circulatory system: Secondary | ICD-10-CM | POA: Diagnosis not present

## 2020-10-11 DIAGNOSIS — I132 Hypertensive heart and chronic kidney disease with heart failure and with stage 5 chronic kidney disease, or end stage renal disease: Secondary | ICD-10-CM | POA: Insufficient documentation

## 2020-10-11 DIAGNOSIS — I050 Rheumatic mitral stenosis: Secondary | ICD-10-CM | POA: Insufficient documentation

## 2020-10-11 DIAGNOSIS — E785 Hyperlipidemia, unspecified: Secondary | ICD-10-CM | POA: Diagnosis not present

## 2020-10-11 DIAGNOSIS — N400 Enlarged prostate without lower urinary tract symptoms: Secondary | ICD-10-CM | POA: Insufficient documentation

## 2020-10-11 DIAGNOSIS — I5022 Chronic systolic (congestive) heart failure: Secondary | ICD-10-CM | POA: Insufficient documentation

## 2020-10-11 HISTORY — PX: IR PARACENTESIS: IMG2679

## 2020-10-11 MED ORDER — CARVEDILOL 3.125 MG PO TABS: 3.1250 mg | ORAL_TABLET | Freq: Two times a day (BID) | ORAL | 3 refills | Status: DC

## 2020-10-11 MED ORDER — LIDOCAINE HCL (PF) 1 % IJ SOLN
INTRAMUSCULAR | Status: DC | PRN
  Administered 2020-10-11: 10 mL

## 2020-10-11 MED ORDER — LIDOCAINE HCL 1 % IJ SOLN
INTRAMUSCULAR | Status: AC
  Filled 2020-10-11: qty 20

## 2020-10-11 NOTE — Progress Notes (Signed)
Heart Failure  Note  ID:  Turrell, Severt [birth year], MRN [id number] Provider location: [place] [facility] Type of Visit: Established patient   PCP:  [name], MD  Cardiologist:  [name]   History of Present Illness: Vernon Blackburn is a 55 y.o. male who has a history of long-standing, poorly-controlled HTN, CKD stage 3-4, chronic systolic CHF and severe mitral regurgitation.  He presents for followup of CHF and HTN.  He has had hypertension for years.  He has had known CKD, followed by Dr. [name].  He developed severe dyspnea in 12/17 and was admitted with acute systolic CHF.  Echo showed EF 20-25% with severe MR.  He was diuresed and discharged.   TEE was done in 5/18 to assess mitral valve.  There was severe MR.  The MV was thickened and did not coapt well.  Suspect there was a component of secondary MR with moderate LV dilation, however the thickened leaflets suggested a component of primary MR as well.   He had right and left heart cath in 6/18.  No significant CAD, elevated filling pressures.    In 6/18, he had mitral valve repair.  Post-op echo in 7/18 showed EF 25-30% with stable MV repair (mild MR, mean gradient 4 mmHg).  Echo 12/18 with EF 30-35%, moderate LV dilation, s/p MVR repair with trivial MR.  Echo in 4/19 showed EF up to 40-45% with stable repaired mitral valve.   He had an echo in 8/20 with EF 35%, mild LV dilation, s/p MV repair with mean gradient 4 mmHg and trivial MR, mildly decreased RV systolic function.   He was admitted in 2/21 to [facility] with worsening renal function and HD was started.  Echo at [facility] in 2/21 showed EF 40-45%, mild LV dilation, moderate LVH, "severe mitral stenosis" with mean gradient 7 mmHg and eccentric mild MR.  Head imagining showed an old CVA.   TEE was done to followup on ?severe mitral stenosis in 3/21.  This showed EF 45%, moderate LVH, global mild hypokinesis, mildly decreased RV systolic function; s/p MV repair,  mild MR with mean gradient 4 mmHg, no significant mitral stenosis.  CPX in 11/21 showed peak VO2 17.7 (51% predicted), RER 1.12, VE/VCO2 slope 29.  Moderate functional limitation due to mixed restrictive/obstructive lung disease.  Echo in 7/22 showed EF 50-55%, s/p MV repair with trivial MR and mean gradient 5 mmHg.   Patient was admitted in 7/22 with abdominal distention and found to have ascites, cause still uncertain (unless simply due to inadequate dialysis prescription).  No evidence for cirrhosis by CT. He had paracentesis. He additionally had TURP for enlarged prostate and has ended up with a suprapubic catheter.    He returns today for followup of CHF.  He has continued to have trouble with ascites, had a paracentesis 2 wks ago.  Abdomen remains distended, he gets short of breath when he builds up ascites.  No chest pain.   He is having trouble with BP dropping at HD, gets lightheaded.  Still has suprapubic catheter.  He makes some urine and takes Lasix on non-HD days.  Labs (4/18): K 3.7, creatinine 3.05, hgb 10.4 Labs (5/18): K 3.3, creatinine 2.89, BNP 789 Labs (6/18): K 3.4, creatinine 2.4, hgb 10.1 Labs (7/18): K 3.2, creatinine 2.27, BNP 303 Labs (9/18): LDL 78, HDL 56, K 3.8, creatinine 2.16 Labs (10/18): hgb 10.1 Labs (2/19): K 3.3, creatinine 2.75, hgb 12.1 Labs (5/19): K 3.6, creatinine  3.07 Labs (7/20): K 4, creatinine 3.55, hgb 12.8 Labs (2/21): LDL 127 Labs (7/22): hgb 10.4  PMH: 1. Gout 2. HTN: Long-standing, poor control. 3. ESRD.  Likely hypertensive nephropathy.  4. Dilated cardiomyopathy: May be due to long-standing HTN.  - Echo (1/18): EF 20-25%, severe MR - Echo (2/18): EF 25%, severe MR.  - TEE (5/18): Moderate LV dilation with severe global hypokinesis, EF 25-30%, normal RV size with mildly decreased systolic function, RV-RA gradient 55 mmHg, severe MR with mildly thickened leaflets with inadequate coaptation and ERO 0.52 cm^2 by PISA and systolic flow reversal  in the pulmonary vein doppler pattern => secondary MR from dilated LV but thickened leaflets lead to concern for component of primary MR as well.  - RHC/LHC (6/18): No significant CAD.  Mean RA 15, PA 76/31 mean 48, mean PCWP 45, CI 3.02 Fick, 2.64 thermo.  - Echo (7/18): EF 25-30%, mild dilation, diffuse hypokinesis, s/p MV repair with mean gradient 4 mmHg and mild MR, PASP 37 mmHg, normal RV size and systolic function.  - Echo (12/18): EF 30-35%, moderate LV dilation with diffuse hypokinesis, stable MV repair.  - Echo (4/19): EF 40-45%, severe LV dilation, s/p mitral valve repair with mean gradient 4 mmHg, no mitral regurgitation, severe RV dilation with normal systolic function.  - Echo (8/20): EF 35%, mild LV dilation, s/p MV repair with mean gradient 4 mmHg and trivial MR, mildly decreased RV systolic function.  - Echo (2/21, [facility]):  EF 40-45%, mild LV dilation, moderate LVH, "severe mitral stenosis" with mean gradient 7 mmHg and eccentric mild MR. - TEE (3/21): EF 45%, moderate LVH, global mild hypokinesis, mildly decreased RV systolic function; s/p MV repair, mild MR with mean gradient 4 mmHg, no significant mitral stenosis.  - CPX (11/21): peak VO2 17.7 (51% predicted), RER 1.12, VE/VCO2 slope 29.  Moderate functional limitation due to mixed restrictive/obstructive lung disease.  - Echo (7/22): EF 50-55%, s/p MV repair with trivial MR and mean gradient 5 mmHg.  5. Severe MR: S/p MV repair in 6/18. 6. H/o CVA 7. BPH: S/p TURP, now with suprapubic catheter.  8. H/o ascites (7/22) with paracentesis.   SH: Married, lives in [place], nonsmoker, no drugs, occasional ETOH.  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   Family History  Problem Relation Age of Onset   Diabetes Mother    Hypertension Mother    Heart failure Mother    Colon cancer Neg Hx    Esophageal cancer Neg Hx    Rectal cancer Neg Hx    Stomach cancer Neg Hx    ROS: All systems reviewed and negative  except as per HPI.   Current Outpatient Medications  Medication Sig Dispense Refill   Albuterol Sulfate (PROAIR RESPICLICK) 270 (90 Base) MCG/ACT AEPB Inhale 2 puffs into the lungs every 6 (six) hours as needed (wheezing/shortness of breath).     atorvastatin (LIPITOR) 20 MG tablet Take 20 mg by mouth daily.     B Complex-C-Zn-Folic Acid (DIALYVITE 350 WITH ZINC) 0.8 MG TABS Take 1 tablet by mouth daily.     Budeson-Glycopyrrol-Formoterol (BREZTRI AEROSPHERE) 160-9-4.8 MCG/ACT AERO Inhale 2 puffs into the lungs 2 (two) times daily as needed (congestion/respiratory issues.).     calcitRIOL (ROCALTROL) 0.5 MCG capsule Take 0.5 mcg by mouth every Monday, Wednesday, and Friday.     calcium acetate (PHOSLO) 667 MG capsule Take 2,668 mg by mouth 3 (three) times daily with meals.     Febuxostat 80  MG TABS Take 80 mg by mouth daily.     fexofenadine (ALLEGRA) 180 MG tablet Take 180 mg by mouth daily as needed for allergies.     fluocinonide ointment (LIDEX) 7.25 % Apply 1 application topically 2 (two) times daily as needed (eczema). (Hands)     furosemide (LASIX) 80 MG tablet Take 80 mg by mouth 2 (two) times daily.     lactulose (CHRONULAC) 10 GM/15ML solution Take 10 g by mouth 2 (two) times daily as needed for mild constipation.     polyethylene glycol (MIRALAX / GLYCOLAX) 17 g packet Take 17 g by mouth daily as needed.     sildenafil (REVATIO) 20 MG tablet Take 20 mg by mouth daily as needed (erectile dysfunction).     tacrolimus (PROTOPIC) 0.1 % ointment Apply 1 application topically 2 (two) times daily as needed (eczema). (eyelids/hands)     tamsulosin (FLOMAX) 0.4 MG CAPS capsule Take 0.4 mg by mouth in the morning.     VYVANSE 30 MG capsule Take 30 mg by mouth every morning.     carvedilol (COREG) 3.125 MG tablet Take 1 tablet (3.125 mg total) by mouth 2 (two) times daily with a meal. 180 tablet 3   No current facility-administered medications for this encounter.   Facility-Administered  Medications Ordered in Other Encounters  Medication Dose Route Frequency Provider Last Rate Last Admin   lidocaine (PF) (XYLOCAINE) 1 % injection    PRN Jacqualine Mau, NP   10 mL at 10/11/20 1404   lidocaine (XYLOCAINE) 1 % (with pres) injection            Exam:   BP 100/60   Pulse 81   Wt 75.3 kg (166 lb)   SpO2 98%   BMI 23.82 kg/m  General: NAD Neck: No JVD, no thyromegaly or thyroid nodule.  Lungs: Clear to auscultation bilaterally with normal respiratory effort. CV: Nondisplaced PMI.  Heart regular S1/S2, no S3/S4, 2/6 HSM apex.  No peripheral edema.  No carotid bruit.  Normal pedal pulses.  Abdomen: Soft, nontender, no hepatosplenomegaly, moderate distention.  Skin: Intact without lesions or rashes.  Neurologic: Alert and oriented x 3.  Psych: Normal affect. Extremities: No clubbing or cyanosis.  HEENT: Normal.   Assessment/Plan: 1. HTN: Possible hypertensive cardiomyopathy and hypertensive nephropathy.  BP now running low.  2. ESRD: He was switched from PD to HD, working up for renal transplant at [facility].  This is on hold with ascites and suprapubic catheter.  3. Chronic systolic CHF: Echo in 3/66 with EF 25-30%. Nonischemic cardiomyopathy based on cath in 6/18.  Cause of cardiomyopathy may be long-standing, poorly controlled HTN.  NYHA class II symptoms.  He improved symptomatically s/p MV repair.  Echo in 4/19 with EF 40-45%. Echo at [facility] in 2/21 showed EF 40-45%.   TEE in 3/21 with EF 45%.  Echo in 5/22 with EF up to 50-55%.  NYHA class II-III. He is not volume overloaded on exam.  - With orthostatic symptoms and soft BP, decrease Coreg to 4.403 mg bid.  - Uncertain cause of ascites => does not look like RV failure by echo, but CT abdomen in 3/22 did not show cirrhosis.  To try to sort this out, we discussed RHC to assess right-sided filling pressures.  He agrees to procedure.  4. Mitral regurgitation: s/p MV repair for severe MR.  Much improved symptomatically s/p  surgery. Echo at [facility] in 2/21 suggested "severe" mitral stenosis with mean gradient 7 mmHg and  eccentric MR, ?mild.  However, TEE here in 3/21 showed stable MV repair with mild MR and no significant mitral stenosis.  Echo in 5/22 showed stable repaired mitral valve with no more than mild stenosis and trivial regurgitation.  - Will need abx with dental work given prosthetic material.  5. Asthma: Occasional wheezing, not exertional.  Abnormal CPX in the past suggesting lung pathology. 6. CVA: Prior CVA by head imaging.   7. Hyperlipidemia: Continue atorvastatin.   Followup in 6 months if RHC does not suggest RV failure as cause of cirrhosis.   Signed, Loralie Champagne, MD  10/11/2020  [place] [address] Heart and [place] [address] [phone] (office) [phone] (fax)

## 2020-10-11 NOTE — Patient Instructions (Addendum)
No Labs done today.   DECREASE Carvedilol to 3.125mg  (1 tablet) by mouth 2 times daily. ON DIALYSIS DAYS ONLY TAKE 1 TABLET BY MOUTH EVERY EVENING.   No other medication changes were made. Please continue all current medications as prescribed.  Your physician recommends that you schedule a follow-up appointment in: 6 months. Please contact our office in March 2023 to schedule a April 2023 appointment.   If you have any questions or concerns before your next appointment please send [place] a message through [place] or call our office at [phone].    TO LEAVE A MESSAGE FOR THE NURSE SELECT OPTION 2, PLEASE LEAVE A MESSAGE INCLUDING: YOUR NAME DATE OF BIRTH CALL BACK NUMBER REASON FOR CALL**this is important as we prioritize the call backs  YOU WILL RECEIVE A CALL BACK THE SAME DAY AS LONG AS YOU CALL BEFORE 4:00 PM   Do the following things EVERYDAY: Weigh yourself in the morning before breakfast. Write it down and keep it in a log. Take your medicines as prescribed Eat low salt foods--Limit salt (sodium) to 2000 mg per day.  Stay as active as you can everyday Limit all fluids for the day to less than 2 liters   At the [facility], you and your health needs are our priority. As part of our continuing mission to provide you with exceptional heart care, we have created designated Provider Care Teams. These Care Teams include your primary Cardiologist (physician) and Advanced Practice Providers (APPs- Physician Assistants and Nurse Practitioners) who all work together to provide you with the care you need, when you need it.   You may see any of the following providers on your designated Care Team at your next follow up: Dr Glori Bickers Dr Haynes Kerns, NP Lyda Jester, [place] Audry Riles, PharmD   Please be sure to bring in all your medications bottles to every appointment.   You are scheduled for a Cardiac Catheterization on Tuesday, October 18 with  Dr. Loralie Champagne.  1. Please arrive at the [facility] (Main Entrance A) at [facility]: [address] [place], [place] [address] at 10:00 AM (This time is two hours before your procedure to ensure your preparation). Free valet parking service is available.   Special note: Every effort is made to have your procedure done on time. Please understand that emergencies sometimes delay scheduled procedures.  2. Diet: Do not eat solid foods after midnight.  The patient may have clear liquids until 5am upon the day of the procedure.  3. Medication instructions in preparation for your procedure:  ON THE MORNING OF YOUR PROCEDURE DO NOT TAKE LASIX.  On the morning of your procedure, take any morning medicines NOT listed above.  You may use sips of water.  4. Plan for one night stay--bring personal belongings. 5. Bring a current list of your medications and current insurance cards. 6. You MUST have a responsible person to drive you home. 7. Someone MUST be with you the first 24 hours after you arrive home or your discharge will be delayed. 8. Please wear clothes that are easy to get on and off and wear slip-on shoes.  Thank you for allowing [place] to care for you!   --  Invasive Cardiovascular services

## 2020-10-11 NOTE — H&P (View-Only) (Signed)
Heart Failure  Note  ID:  Vernon Blackburn, Vernon Blackburn 1965/03/22, MRN [id number] Provider location: [place] [facility] Type of Visit: Established patient   PCP:  [name], MD  Cardiologist:  [name]   History of Present Illness: Vernon Blackburn is a 55 y.o. male who has a history of long-standing, poorly-controlled HTN, CKD stage 3-4, chronic systolic CHF and severe mitral regurgitation.  He presents for followup of CHF and HTN.  He has had hypertension for years.  He has had known CKD, followed by Dr. [name].  He developed severe dyspnea in 12/17 and was admitted with acute systolic CHF.  Echo showed EF 20-25% with severe MR.  He was diuresed and discharged.   TEE was done in 5/18 to assess mitral valve.  There was severe MR.  The MV was thickened and did not coapt well.  Suspect there was a component of secondary MR with moderate LV dilation, however the thickened leaflets suggested a component of primary MR as well.   He had right and left heart cath in 6/18.  No significant CAD, elevated filling pressures.    In 6/18, he had mitral valve repair.  Post-op echo in 7/18 showed EF 25-30% with stable MV repair (mild MR, mean gradient 4 mmHg).  Echo 12/18 with EF 30-35%, moderate LV dilation, s/p MVR repair with trivial MR.  Echo in 4/19 showed EF up to 40-45% with stable repaired mitral valve.   He had an echo in 8/20 with EF 35%, mild LV dilation, s/p MV repair with mean gradient 4 mmHg and trivial MR, mildly decreased RV systolic function.   He was admitted in 2/21 to [facility] with worsening renal function and HD was started.  Echo at [facility] in 2/21 showed EF 40-45%, mild LV dilation, moderate LVH, "severe mitral stenosis" with mean gradient 7 mmHg and eccentric mild MR.  Head imagining showed an old CVA.   TEE was done to followup on ?severe mitral stenosis in 3/21.  This showed EF 45%, moderate LVH, global mild hypokinesis, mildly decreased RV systolic function; s/p MV repair,  mild MR with mean gradient 4 mmHg, no significant mitral stenosis.  CPX in 11/21 showed peak VO2 17.7 (51% predicted), RER 1.12, VE/VCO2 slope 29.  Moderate functional limitation due to mixed restrictive/obstructive lung disease.  Echo in 7/22 showed EF 50-55%, s/p MV repair with trivial MR and mean gradient 5 mmHg.   Patient was admitted in 7/22 with abdominal distention and found to have ascites, cause still uncertain (unless simply due to inadequate dialysis prescription).  No evidence for cirrhosis by CT. He had paracentesis. He additionally had TURP for enlarged prostate and has ended up with a suprapubic catheter.    He returns today for followup of CHF.  He has continued to have trouble with ascites, had a paracentesis 2 wks ago.  Abdomen remains distended, he gets short of breath when he builds up ascites.  No chest pain.   He is having trouble with BP dropping at HD, gets lightheaded.  Still has suprapubic catheter.  He makes some urine and takes Lasix on non-HD days.  Labs (4/18): K 3.7, creatinine 3.05, hgb 10.4 Labs (5/18): K 3.3, creatinine 2.89, BNP 789 Labs (6/18): K 3.4, creatinine 2.4, hgb 10.1 Labs (7/18): K 3.2, creatinine 2.27, BNP 303 Labs (9/18): LDL 78, HDL 56, K 3.8, creatinine 2.16 Labs (10/18): hgb 10.1 Labs (2/19): K 3.3, creatinine 2.75, hgb 12.1 Labs (5/19): K 3.6, creatinine  3.07 Labs (7/20): K 4, creatinine 3.55, hgb 12.8 Labs (2/21): LDL 127 Labs (7/22): hgb 10.4  PMH: 1. Gout 2. HTN: Long-standing, poor control. 3. ESRD.  Likely hypertensive nephropathy.  4. Dilated cardiomyopathy: May be due to long-standing HTN.  - Echo (1/18): EF 20-25%, severe MR - Echo (2/18): EF 25%, severe MR.  - TEE (5/18): Moderate LV dilation with severe global hypokinesis, EF 25-30%, normal RV size with mildly decreased systolic function, RV-RA gradient 55 mmHg, severe MR with mildly thickened leaflets with inadequate coaptation and ERO 0.52 cm^2 by PISA and systolic flow reversal  in the pulmonary vein doppler pattern => secondary MR from dilated LV but thickened leaflets lead to concern for component of primary MR as well.  - RHC/LHC (6/18): No significant CAD.  Mean RA 15, PA 76/31 mean 48, mean PCWP 45, CI 3.02 Fick, 2.64 thermo.  - Echo (7/18): EF 25-30%, mild dilation, diffuse hypokinesis, s/p MV repair with mean gradient 4 mmHg and mild MR, PASP 37 mmHg, normal RV size and systolic function.  - Echo (12/18): EF 30-35%, moderate LV dilation with diffuse hypokinesis, stable MV repair.  - Echo (4/19): EF 40-45%, severe LV dilation, s/p mitral valve repair with mean gradient 4 mmHg, no mitral regurgitation, severe RV dilation with normal systolic function.  - Echo (8/20): EF 35%, mild LV dilation, s/p MV repair with mean gradient 4 mmHg and trivial MR, mildly decreased RV systolic function.  - Echo (2/21, [facility]):  EF 40-45%, mild LV dilation, moderate LVH, "severe mitral stenosis" with mean gradient 7 mmHg and eccentric mild MR. - TEE (3/21): EF 45%, moderate LVH, global mild hypokinesis, mildly decreased RV systolic function; s/p MV repair, mild MR with mean gradient 4 mmHg, no significant mitral stenosis.  - CPX (11/21): peak VO2 17.7 (51% predicted), RER 1.12, VE/VCO2 slope 29.  Moderate functional limitation due to mixed restrictive/obstructive lung disease.  - Echo (7/22): EF 50-55%, s/p MV repair with trivial MR and mean gradient 5 mmHg.  5. Severe MR: S/p MV repair in 6/18. 6. H/o CVA 7. BPH: S/p TURP, now with suprapubic catheter.  8. H/o ascites (7/22) with paracentesis.   SH: Married, lives in [place], nonsmoker, no drugs, occasional ETOH.  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   Family History  Problem Relation Age of Onset   Diabetes Mother    Hypertension Mother    Heart failure Mother    Colon cancer Neg Hx    Esophageal cancer Neg Hx    Rectal cancer Neg Hx    Stomach cancer Neg Hx    ROS: All systems reviewed and negative  except as per HPI.   Current Outpatient Medications  Medication Sig Dispense Refill   Albuterol Sulfate (PROAIR RESPICLICK) 160 (90 Base) MCG/ACT AEPB Inhale 2 puffs into the lungs every 6 (six) hours as needed (wheezing/shortness of breath).     atorvastatin (LIPITOR) 20 MG tablet Take 20 mg by mouth daily.     B Complex-C-Zn-Folic Acid (DIALYVITE 109 WITH ZINC) 0.8 MG TABS Take 1 tablet by mouth daily.     Budeson-Glycopyrrol-Formoterol (BREZTRI AEROSPHERE) 160-9-4.8 MCG/ACT AERO Inhale 2 puffs into the lungs 2 (two) times daily as needed (congestion/respiratory issues.).     calcitRIOL (ROCALTROL) 0.5 MCG capsule Take 0.5 mcg by mouth every Monday, Wednesday, and Friday.     calcium acetate (PHOSLO) 667 MG capsule Take 2,668 mg by mouth 3 (three) times daily with meals.     Febuxostat 80  MG TABS Take 80 mg by mouth daily.     fexofenadine (ALLEGRA) 180 MG tablet Take 180 mg by mouth daily as needed for allergies.     fluocinonide ointment (LIDEX) 9.37 % Apply 1 application topically 2 (two) times daily as needed (eczema). (Hands)     furosemide (LASIX) 80 MG tablet Take 80 mg by mouth 2 (two) times daily.     lactulose (CHRONULAC) 10 GM/15ML solution Take 10 g by mouth 2 (two) times daily as needed for mild constipation.     polyethylene glycol (MIRALAX / GLYCOLAX) 17 g packet Take 17 g by mouth daily as needed.     sildenafil (REVATIO) 20 MG tablet Take 20 mg by mouth daily as needed (erectile dysfunction).     tacrolimus (PROTOPIC) 0.1 % ointment Apply 1 application topically 2 (two) times daily as needed (eczema). (eyelids/hands)     tamsulosin (FLOMAX) 0.4 MG CAPS capsule Take 0.4 mg by mouth in the morning.     VYVANSE 30 MG capsule Take 30 mg by mouth every morning.     carvedilol (COREG) 3.125 MG tablet Take 1 tablet (3.125 mg total) by mouth 2 (two) times daily with a meal. 180 tablet 3   No current facility-administered medications for this encounter.   Facility-Administered  Medications Ordered in Other Encounters  Medication Dose Route Frequency Provider Last Rate Last Admin   lidocaine (PF) (XYLOCAINE) 1 % injection    PRN Jacqualine Mau, NP   10 mL at 10/11/20 1404   lidocaine (XYLOCAINE) 1 % (with pres) injection            Exam:   BP 100/60   Pulse 81   Wt 75.3 kg (166 lb)   SpO2 98%   BMI 23.82 kg/m  General: NAD Neck: No JVD, no thyromegaly or thyroid nodule.  Lungs: Clear to auscultation bilaterally with normal respiratory effort. CV: Nondisplaced PMI.  Heart regular S1/S2, no S3/S4, 2/6 HSM apex.  No peripheral edema.  No carotid bruit.  Normal pedal pulses.  Abdomen: Soft, nontender, no hepatosplenomegaly, moderate distention.  Skin: Intact without lesions or rashes.  Neurologic: Alert and oriented x 3.  Psych: Normal affect. Extremities: No clubbing or cyanosis.  HEENT: Normal.   Assessment/Plan: 1. HTN: Possible hypertensive cardiomyopathy and hypertensive nephropathy.  BP now running low.  2. ESRD: He was switched from PD to HD, working up for renal transplant at [facility].  This is on hold with ascites and suprapubic catheter.  3. Chronic systolic CHF: Echo in 1/69 with EF 25-30%. Nonischemic cardiomyopathy based on cath in 6/18.  Cause of cardiomyopathy may be long-standing, poorly controlled HTN.  NYHA class II symptoms.  He improved symptomatically s/p MV repair.  Echo in 4/19 with EF 40-45%. Echo at [facility] in 2/21 showed EF 40-45%.   TEE in 3/21 with EF 45%.  Echo in 5/22 with EF up to 50-55%.  NYHA class II-III. He is not volume overloaded on exam.  - With orthostatic symptoms and soft BP, decrease Coreg to 6.789 mg bid.  - Uncertain cause of ascites => does not look like RV failure by echo, but CT abdomen in 3/22 did not show cirrhosis.  To try to sort this out, we discussed RHC to assess right-sided filling pressures.  He agrees to procedure.  4. Mitral regurgitation: s/p MV repair for severe MR.  Much improved symptomatically s/p  surgery. Echo at [facility] in 2/21 suggested "severe" mitral stenosis with mean gradient 7 mmHg and  eccentric MR, ?mild.  However, TEE here in 3/21 showed stable MV repair with mild MR and no significant mitral stenosis.  Echo in 5/22 showed stable repaired mitral valve with no more than mild stenosis and trivial regurgitation.  - Will need abx with dental work given prosthetic material.  5. Asthma: Occasional wheezing, not exertional.  Abnormal CPX in the past suggesting lung pathology. 6. CVA: Prior CVA by head imaging.   7. Hyperlipidemia: Continue atorvastatin.   Followup in 6 months if RHC does not suggest RV failure as cause of cirrhosis.   Signed, Loralie Champagne, MD  10/11/2020  [place] [address] [facility] [place] [address] [phone] (office) [phone] (fax)

## 2020-10-11 NOTE — Procedures (Signed)
Ultrasound-guided therapeutic paracentesis performed yielding 7 liters of straw colored fluid.  No immediate complications. EBL is none.

## 2020-10-12 DIAGNOSIS — N2581 Secondary hyperparathyroidism of renal origin: Secondary | ICD-10-CM | POA: Diagnosis not present

## 2020-10-12 DIAGNOSIS — Z992 Dependence on renal dialysis: Secondary | ICD-10-CM | POA: Diagnosis not present

## 2020-10-12 DIAGNOSIS — N186 End stage renal disease: Secondary | ICD-10-CM | POA: Diagnosis not present

## 2020-10-12 DIAGNOSIS — D509 Iron deficiency anemia, unspecified: Secondary | ICD-10-CM | POA: Diagnosis not present

## 2020-10-12 DIAGNOSIS — D689 Coagulation defect, unspecified: Secondary | ICD-10-CM | POA: Diagnosis not present

## 2020-10-13 DIAGNOSIS — N186 End stage renal disease: Secondary | ICD-10-CM | POA: Diagnosis not present

## 2020-10-13 DIAGNOSIS — D509 Iron deficiency anemia, unspecified: Secondary | ICD-10-CM | POA: Diagnosis not present

## 2020-10-13 DIAGNOSIS — D689 Coagulation defect, unspecified: Secondary | ICD-10-CM | POA: Diagnosis not present

## 2020-10-13 DIAGNOSIS — Z992 Dependence on renal dialysis: Secondary | ICD-10-CM | POA: Diagnosis not present

## 2020-10-13 DIAGNOSIS — N2581 Secondary hyperparathyroidism of renal origin: Secondary | ICD-10-CM | POA: Diagnosis not present

## 2020-10-14 ENCOUNTER — Other Ambulatory Visit (HOSPITAL_COMMUNITY): Payer: Self-pay | Admitting: *Deleted

## 2020-10-14 DIAGNOSIS — I5032 Chronic diastolic (congestive) heart failure: Secondary | ICD-10-CM

## 2020-10-15 DIAGNOSIS — N2581 Secondary hyperparathyroidism of renal origin: Secondary | ICD-10-CM | POA: Diagnosis not present

## 2020-10-15 DIAGNOSIS — Z992 Dependence on renal dialysis: Secondary | ICD-10-CM | POA: Diagnosis not present

## 2020-10-15 DIAGNOSIS — D509 Iron deficiency anemia, unspecified: Secondary | ICD-10-CM | POA: Diagnosis not present

## 2020-10-15 DIAGNOSIS — D689 Coagulation defect, unspecified: Secondary | ICD-10-CM | POA: Diagnosis not present

## 2020-10-15 DIAGNOSIS — N186 End stage renal disease: Secondary | ICD-10-CM | POA: Diagnosis not present

## 2020-10-18 DIAGNOSIS — D509 Iron deficiency anemia, unspecified: Secondary | ICD-10-CM | POA: Diagnosis not present

## 2020-10-18 DIAGNOSIS — N2581 Secondary hyperparathyroidism of renal origin: Secondary | ICD-10-CM | POA: Diagnosis not present

## 2020-10-18 DIAGNOSIS — D689 Coagulation defect, unspecified: Secondary | ICD-10-CM | POA: Diagnosis not present

## 2020-10-18 DIAGNOSIS — N186 End stage renal disease: Secondary | ICD-10-CM | POA: Diagnosis not present

## 2020-10-18 DIAGNOSIS — Z992 Dependence on renal dialysis: Secondary | ICD-10-CM | POA: Diagnosis not present

## 2020-10-19 ENCOUNTER — Encounter (HOSPITAL_COMMUNITY)
Admission: RE | Disposition: A | Discharge status: Home / Self Care | Payer: Self-pay | Source: Home / Self Care | Attending: Cardiology

## 2020-10-19 ENCOUNTER — Ambulatory Visit (HOSPITAL_COMMUNITY)
Admission: RE | Admit: 2020-10-19 | Discharge: 2020-10-19 | Disposition: A | Discharge status: Home / Self Care | Payer: Medicare Other | Attending: Cardiology | Admitting: Cardiology

## 2020-10-19 ENCOUNTER — Other Ambulatory Visit: Payer: Self-pay

## 2020-10-19 DIAGNOSIS — R188 Other ascites: Secondary | ICD-10-CM | POA: Insufficient documentation

## 2020-10-19 DIAGNOSIS — E785 Hyperlipidemia, unspecified: Secondary | ICD-10-CM | POA: Diagnosis not present

## 2020-10-19 DIAGNOSIS — I420 Dilated cardiomyopathy: Secondary | ICD-10-CM | POA: Insufficient documentation

## 2020-10-19 DIAGNOSIS — I132 Hypertensive heart and chronic kidney disease with heart failure and with stage 5 chronic kidney disease, or end stage renal disease: Secondary | ICD-10-CM | POA: Insufficient documentation

## 2020-10-19 DIAGNOSIS — Z8673 Personal history of transient ischemic attack (TIA), and cerebral infarction without residual deficits: Secondary | ICD-10-CM | POA: Diagnosis not present

## 2020-10-19 DIAGNOSIS — J45909 Unspecified asthma, uncomplicated: Secondary | ICD-10-CM | POA: Diagnosis not present

## 2020-10-19 DIAGNOSIS — I5032 Chronic diastolic (congestive) heart failure: Secondary | ICD-10-CM

## 2020-10-19 DIAGNOSIS — Z79899 Other long term (current) drug therapy: Secondary | ICD-10-CM | POA: Diagnosis not present

## 2020-10-19 DIAGNOSIS — N186 End stage renal disease: Secondary | ICD-10-CM | POA: Diagnosis not present

## 2020-10-19 DIAGNOSIS — I052 Rheumatic mitral stenosis with insufficiency: Secondary | ICD-10-CM | POA: Diagnosis not present

## 2020-10-19 DIAGNOSIS — I5022 Chronic systolic (congestive) heart failure: Secondary | ICD-10-CM | POA: Insufficient documentation

## 2020-10-19 HISTORY — PX: RIGHT HEART CATH: CATH118263

## 2020-10-19 LAB — POCT I-STAT, CHEM 8
BUN: 42 mg/dL — ABNORMAL HIGH (ref 6–20)
Calcium, Ion: 0.97 mmol/L — ABNORMAL LOW (ref 1.15–1.40)
Chloride: 94 mmol/L — ABNORMAL LOW (ref 98–111)
Creatinine, Ser: 8.5 mg/dL — ABNORMAL HIGH (ref 0.61–1.24)
Glucose, Bld: 79 mg/dL (ref 70–99)
HCT: 40 % (ref 39.0–52.0)
Hemoglobin: 13.6 g/dL (ref 13.0–17.0)
Potassium: 5 mmol/L (ref 3.5–5.1)
Sodium: 137 mmol/L (ref 135–145)
TCO2: 38 mmol/L — ABNORMAL HIGH (ref 22–32)

## 2020-10-19 LAB — BASIC METABOLIC PANEL
Anion gap: 10 (ref 5–15)
BUN: 27 mg/dL — ABNORMAL HIGH (ref 6–20)
CO2: 34 mmol/L — ABNORMAL HIGH (ref 22–32)
Calcium: 8.3 mg/dL — ABNORMAL LOW (ref 8.9–10.3)
Chloride: 94 mmol/L — ABNORMAL LOW (ref 98–111)
Creatinine, Ser: 8.24 mg/dL — ABNORMAL HIGH (ref 0.61–1.24)
GFR, Estimated: 7 mL/min — ABNORMAL LOW (ref >60)
Glucose, Bld: 84 mg/dL (ref 70–99)
Potassium: 5.3 mmol/L — ABNORMAL HIGH (ref 3.5–5.1)
Sodium: 138 mmol/L (ref 135–145)

## 2020-10-19 LAB — CBC
HCT: 40.6 % (ref 39.0–52.0)
Hemoglobin: 12.9 g/dL — ABNORMAL LOW (ref 13.0–17.0)
MCH: 29.1 pg (ref 26.0–34.0)
MCHC: 31.8 g/dL (ref 30.0–36.0)
MCV: 91.6 fL (ref 80.0–100.0)
Platelets: 286 10*3/uL (ref 150–400)
RBC: 4.43 MIL/uL (ref 4.22–5.81)
RDW: 17.7 % — ABNORMAL HIGH (ref 11.5–15.5)
WBC: 6.7 10*3/uL (ref 4.0–10.5)
nRBC: 0 % (ref 0.0–0.2)

## 2020-10-19 LAB — POCT I-STAT EG7
Acid-Base Excess: 10 mmol/L — ABNORMAL HIGH (ref 0.0–2.0)
Acid-Base Excess: 11 mmol/L — ABNORMAL HIGH (ref 0.0–2.0)
Bicarbonate: 36.4 mmol/L — ABNORMAL HIGH (ref 20.0–28.0)
Bicarbonate: 37.5 mmol/L — ABNORMAL HIGH (ref 20.0–28.0)
Calcium, Ion: 1.08 mmol/L — ABNORMAL LOW (ref 1.15–1.40)
Calcium, Ion: 1.1 mmol/L — ABNORMAL LOW (ref 1.15–1.40)
HCT: 32 % — ABNORMAL LOW (ref 39.0–52.0)
HCT: 32 % — ABNORMAL LOW (ref 39.0–52.0)
Hemoglobin: 10.9 g/dL — ABNORMAL LOW (ref 13.0–17.0)
Hemoglobin: 10.9 g/dL — ABNORMAL LOW (ref 13.0–17.0)
O2 Saturation: 74 %
O2 Saturation: 77 %
Potassium: 4.2 mmol/L (ref 3.5–5.1)
Potassium: 4.2 mmol/L (ref 3.5–5.1)
Sodium: 138 mmol/L (ref 135–145)
Sodium: 138 mmol/L (ref 135–145)
TCO2: 38 mmol/L — ABNORMAL HIGH (ref 22–32)
TCO2: 39 mmol/L — ABNORMAL HIGH (ref 22–32)
pCO2, Ven: 54.8 mmHg (ref 44.0–60.0)
pCO2, Ven: 56.4 mmHg (ref 44.0–60.0)
pH, Ven: 7.43 (ref 7.250–7.430)
pH, Ven: 7.431 — ABNORMAL HIGH (ref 7.250–7.430)
pO2, Ven: 39 mmHg (ref 32.0–45.0)
pO2, Ven: 41 mmHg (ref 32.0–45.0)

## 2020-10-19 SURGERY — RIGHT HEART CATH
Anesthesia: LOCAL

## 2020-10-19 MED ORDER — LIDOCAINE HCL (PF) 1 % IJ SOLN
INTRAMUSCULAR | Status: AC
  Filled 2020-10-19: qty 30

## 2020-10-19 MED ORDER — FENTANYL CITRATE (PF) 100 MCG/2ML IJ SOLN
INTRAMUSCULAR | Status: AC
  Filled 2020-10-19: qty 2

## 2020-10-19 MED ORDER — LIDOCAINE HCL (PF) 1 % IJ SOLN
INTRAMUSCULAR | Status: DC | PRN
  Administered 2020-10-19: 15 mL

## 2020-10-19 MED ORDER — SODIUM CHLORIDE 0.9 % IV SOLN: INTRAVENOUS | Status: DC

## 2020-10-19 MED ORDER — HEPARIN (PORCINE) IN NACL 1000-0.9 UT/500ML-% IV SOLN
INTRAVENOUS | Status: DC | PRN
  Administered 2020-10-19: 500 mL

## 2020-10-19 MED ORDER — SODIUM CHLORIDE 0.9% FLUSH: 3.0000 mL | INTRAVENOUS | Status: DC | PRN

## 2020-10-19 MED ORDER — SODIUM CHLORIDE 0.9 % IV SOLN: 250.0000 mL | INTRAVENOUS | Status: DC | PRN

## 2020-10-19 MED ORDER — ASPIRIN 81 MG PO CHEW
81.0000 mg | CHEWABLE_TABLET | ORAL | Status: AC
  Administered 2020-10-19: 81 mg via ORAL
  Filled 2020-10-19: qty 1

## 2020-10-19 MED ORDER — ACETAMINOPHEN 325 MG PO TABS: 650.0000 mg | ORAL_TABLET | ORAL | Status: DC | PRN

## 2020-10-19 MED ORDER — SODIUM CHLORIDE 0.9% FLUSH: 3.0000 mL | Freq: Two times a day (BID) | INTRAVENOUS | Status: DC

## 2020-10-19 MED ORDER — HEPARIN (PORCINE) IN NACL 1000-0.9 UT/500ML-% IV SOLN
INTRAVENOUS | Status: AC
  Filled 2020-10-19: qty 500

## 2020-10-19 MED ORDER — ONDANSETRON HCL 4 MG/2ML IJ SOLN: 4.0000 mg | Freq: Four times a day (QID) | INTRAMUSCULAR | Status: DC | PRN

## 2020-10-19 MED ORDER — LABETALOL HCL 5 MG/ML IV SOLN: 10.0000 mg | INTRAVENOUS | Status: DC | PRN

## 2020-10-19 MED ORDER — MIDAZOLAM HCL 2 MG/2ML IJ SOLN
INTRAMUSCULAR | Status: DC | PRN
  Administered 2020-10-19: 1 mg via INTRAVENOUS

## 2020-10-19 MED ORDER — FENTANYL CITRATE (PF) 100 MCG/2ML IJ SOLN
INTRAMUSCULAR | Status: DC | PRN
  Administered 2020-10-19: 25 ug via INTRAVENOUS

## 2020-10-19 MED ORDER — HYDRALAZINE HCL 20 MG/ML IJ SOLN: 10.0000 mg | INTRAMUSCULAR | Status: DC | PRN

## 2020-10-19 MED ORDER — MIDAZOLAM HCL 2 MG/2ML IJ SOLN
INTRAMUSCULAR | Status: AC
  Filled 2020-10-19: qty 2

## 2020-10-19 SURGICAL SUPPLY — 7 items
CATH SWAN GANZ 7F STRAIGHT (CATHETERS) ×2 IMPLANT
KIT HEART LEFT (KITS) ×2 IMPLANT
PACK CARDIAC CATHETERIZATION (CUSTOM PROCEDURE TRAY) ×2 IMPLANT
SHEATH GLIDE SLENDER 4/5FR (SHEATH) IMPLANT
SHEATH PINNACLE 7F 10CM (SHEATH) ×2 IMPLANT
SHEATH PROBE COVER 6X72 (BAG) ×2 IMPLANT
TRANSDUCER W/STOPCOCK (MISCELLANEOUS) ×2 IMPLANT

## 2020-10-19 NOTE — Progress Notes (Signed)
Pt ambulated without difficulty or bleeding.   Discharged home with his wife who will drive and stay with pt x 24 hrs. 

## 2020-10-19 NOTE — Interval H&P Note (Signed)
History and Physical Interval Note:  10/19/2020 12:56 PM  Vernon Blackburn  has presented today for surgery, with the diagnosis of heart failure.  The various methods of treatment have been discussed with the patient and family. After consideration of risks, benefits and other options for treatment, the patient has consented to  Procedure(s): RIGHT HEART CATH (N/A) as a surgical intervention.  The patient's history has been reviewed, patient examined, no change in status, stable for surgery.  I have reviewed the patient's chart and labs.  Questions were answered to the patient's satisfaction.     Clessie Karras [employer]

## 2020-10-20 ENCOUNTER — Encounter (HOSPITAL_COMMUNITY): Payer: Self-pay | Admitting: Cardiology

## 2020-10-21 DIAGNOSIS — N2581 Secondary hyperparathyroidism of renal origin: Secondary | ICD-10-CM | POA: Diagnosis not present

## 2020-10-21 DIAGNOSIS — D689 Coagulation defect, unspecified: Secondary | ICD-10-CM | POA: Diagnosis not present

## 2020-10-21 DIAGNOSIS — Z992 Dependence on renal dialysis: Secondary | ICD-10-CM | POA: Diagnosis not present

## 2020-10-21 DIAGNOSIS — D509 Iron deficiency anemia, unspecified: Secondary | ICD-10-CM | POA: Diagnosis not present

## 2020-10-21 DIAGNOSIS — N186 End stage renal disease: Secondary | ICD-10-CM | POA: Diagnosis not present

## 2020-10-22 DIAGNOSIS — D689 Coagulation defect, unspecified: Secondary | ICD-10-CM | POA: Diagnosis not present

## 2020-10-22 DIAGNOSIS — N186 End stage renal disease: Secondary | ICD-10-CM | POA: Diagnosis not present

## 2020-10-22 DIAGNOSIS — D509 Iron deficiency anemia, unspecified: Secondary | ICD-10-CM | POA: Diagnosis not present

## 2020-10-22 DIAGNOSIS — N2581 Secondary hyperparathyroidism of renal origin: Secondary | ICD-10-CM | POA: Diagnosis not present

## 2020-10-22 DIAGNOSIS — Z992 Dependence on renal dialysis: Secondary | ICD-10-CM | POA: Diagnosis not present

## 2020-10-25 DIAGNOSIS — N99114 Postprocedural urethral stricture, male, unspecified: Secondary | ICD-10-CM | POA: Diagnosis not present

## 2020-10-27 DIAGNOSIS — D689 Coagulation defect, unspecified: Secondary | ICD-10-CM | POA: Diagnosis not present

## 2020-10-27 DIAGNOSIS — Z992 Dependence on renal dialysis: Secondary | ICD-10-CM | POA: Diagnosis not present

## 2020-10-27 DIAGNOSIS — N186 End stage renal disease: Secondary | ICD-10-CM | POA: Diagnosis not present

## 2020-10-27 DIAGNOSIS — D509 Iron deficiency anemia, unspecified: Secondary | ICD-10-CM | POA: Diagnosis not present

## 2020-10-27 DIAGNOSIS — N2581 Secondary hyperparathyroidism of renal origin: Secondary | ICD-10-CM | POA: Diagnosis not present

## 2020-10-28 ENCOUNTER — Emergency Department (HOSPITAL_COMMUNITY)
Admission: EM | Admit: 2020-10-28 | Discharge: 2020-10-28 | Disposition: A | Discharge status: Home / Self Care | Payer: Medicare Other | Attending: Emergency Medicine | Admitting: Emergency Medicine

## 2020-10-28 ENCOUNTER — Emergency Department (HOSPITAL_COMMUNITY): Payer: Medicare Other

## 2020-10-28 DIAGNOSIS — J45909 Unspecified asthma, uncomplicated: Secondary | ICD-10-CM | POA: Insufficient documentation

## 2020-10-28 DIAGNOSIS — R188 Other ascites: Secondary | ICD-10-CM | POA: Insufficient documentation

## 2020-10-28 DIAGNOSIS — R140 Abdominal distension (gaseous): Secondary | ICD-10-CM | POA: Diagnosis not present

## 2020-10-28 DIAGNOSIS — N2581 Secondary hyperparathyroidism of renal origin: Secondary | ICD-10-CM | POA: Diagnosis not present

## 2020-10-28 DIAGNOSIS — I5022 Chronic systolic (congestive) heart failure: Secondary | ICD-10-CM | POA: Insufficient documentation

## 2020-10-28 DIAGNOSIS — M545 Low back pain, unspecified: Secondary | ICD-10-CM | POA: Diagnosis not present

## 2020-10-28 DIAGNOSIS — R0602 Shortness of breath: Secondary | ICD-10-CM | POA: Diagnosis not present

## 2020-10-28 DIAGNOSIS — Z992 Dependence on renal dialysis: Secondary | ICD-10-CM | POA: Diagnosis not present

## 2020-10-28 DIAGNOSIS — I120 Hypertensive chronic kidney disease with stage 5 chronic kidney disease or end stage renal disease: Secondary | ICD-10-CM | POA: Insufficient documentation

## 2020-10-28 DIAGNOSIS — R109 Unspecified abdominal pain: Secondary | ICD-10-CM | POA: Insufficient documentation

## 2020-10-28 DIAGNOSIS — I5020 Unspecified systolic (congestive) heart failure: Secondary | ICD-10-CM | POA: Diagnosis not present

## 2020-10-28 DIAGNOSIS — Z20822 Contact with and (suspected) exposure to covid-19: Secondary | ICD-10-CM | POA: Insufficient documentation

## 2020-10-28 DIAGNOSIS — N186 End stage renal disease: Secondary | ICD-10-CM | POA: Insufficient documentation

## 2020-10-28 DIAGNOSIS — D689 Coagulation defect, unspecified: Secondary | ICD-10-CM | POA: Diagnosis not present

## 2020-10-28 DIAGNOSIS — D509 Iron deficiency anemia, unspecified: Secondary | ICD-10-CM | POA: Diagnosis not present

## 2020-10-28 HISTORY — PX: IR PARACENTESIS: IMG2679

## 2020-10-28 LAB — LIPASE, BLOOD: Lipase: 33 U/L (ref 11–51)

## 2020-10-28 LAB — COMPREHENSIVE METABOLIC PANEL
ALT: 72 U/L — ABNORMAL HIGH (ref 0–44)
AST: 56 U/L — ABNORMAL HIGH (ref 15–41)
Albumin: 2.6 g/dL — ABNORMAL LOW (ref 3.5–5.0)
Alkaline Phosphatase: 55 U/L (ref 38–126)
Anion gap: 9 (ref 5–15)
BUN: 28 mg/dL — ABNORMAL HIGH (ref 6–20)
CO2: 34 mmol/L — ABNORMAL HIGH (ref 22–32)
Calcium: 8.3 mg/dL — ABNORMAL LOW (ref 8.9–10.3)
Chloride: 96 mmol/L — ABNORMAL LOW (ref 98–111)
Creatinine, Ser: 7.18 mg/dL — ABNORMAL HIGH (ref 0.61–1.24)
GFR, Estimated: 8 mL/min — ABNORMAL LOW (ref >60)
Glucose, Bld: 87 mg/dL (ref 70–99)
Potassium: 3.6 mmol/L (ref 3.5–5.1)
Sodium: 139 mmol/L (ref 135–145)
Total Bilirubin: 0.3 mg/dL (ref 0.3–1.2)
Total Protein: 5.9 g/dL — ABNORMAL LOW (ref 6.5–8.1)

## 2020-10-28 LAB — CBC WITH DIFFERENTIAL/PLATELET
Abs Immature Granulocytes: 0.04 10*3/uL (ref 0.00–0.07)
Basophils Absolute: 0.1 10*3/uL (ref 0.0–0.1)
Basophils Relative: 1 %
Eosinophils Absolute: 0.3 10*3/uL (ref 0.0–0.5)
Eosinophils Relative: 4 %
HCT: 37 % — ABNORMAL LOW (ref 39.0–52.0)
Hemoglobin: 11.5 g/dL — ABNORMAL LOW (ref 13.0–17.0)
Immature Granulocytes: 1 %
Lymphocytes Relative: 17 %
Lymphs Abs: 1.2 10*3/uL (ref 0.7–4.0)
MCH: 28.7 pg (ref 26.0–34.0)
MCHC: 31.1 g/dL (ref 30.0–36.0)
MCV: 92.3 fL (ref 80.0–100.0)
Monocytes Absolute: 0.8 10*3/uL (ref 0.1–1.0)
Monocytes Relative: 11 %
Neutro Abs: 4.6 10*3/uL (ref 1.7–7.7)
Neutrophils Relative %: 66 %
Platelets: 239 10*3/uL (ref 150–400)
RBC: 4.01 MIL/uL — ABNORMAL LOW (ref 4.22–5.81)
RDW: 17.5 % — ABNORMAL HIGH (ref 11.5–15.5)
WBC: 7.1 10*3/uL (ref 4.0–10.5)
nRBC: 0 % (ref 0.0–0.2)

## 2020-10-28 LAB — PROTIME-INR
INR: 1 (ref 0.8–1.2)
Prothrombin Time: 13.5 seconds (ref 11.4–15.2)

## 2020-10-28 LAB — RESP PANEL BY RT-PCR (FLU A&B, COVID) ARPGX2
Influenza A by PCR: NEGATIVE
Influenza B by PCR: NEGATIVE
SARS Coronavirus 2 by RT PCR: NEGATIVE

## 2020-10-28 MED ORDER — ALBUMIN HUMAN 25 % IV SOLN
50.0000 g | Freq: Once | INTRAVENOUS | Status: AC
  Administered 2020-10-28: 50 g via INTRAVENOUS
  Filled 2020-10-28: qty 200

## 2020-10-28 MED ORDER — LIDOCAINE HCL 1 % IJ SOLN
INTRAMUSCULAR | Status: AC
  Filled 2020-10-28: qty 20

## 2020-10-28 MED ORDER — LIDOCAINE HCL (PF) 1 % IJ SOLN
INTRAMUSCULAR | Status: DC | PRN
  Administered 2020-10-28: 10 mL

## 2020-10-28 NOTE — Procedures (Signed)
PROCEDURE SUMMARY:  Successful US guided paracentesis from left lateral abdomen.  Yielded 6.7 liters of clear yellow fluid.  No immediate complications.  Patient tolerated well.  EBL = trace   Corynn Solberg S Nadia Viar PA-C 10/28/2020 4:27 PM

## 2020-10-28 NOTE — ED Provider Notes (Signed)
Emergency Medicine Provider Triage Evaluation Note  Vernon Blackburn , a 55 y.o. male  was evaluated in triage.  Pt complains of ascites.  Patient went to dialysis today, was able to ambulate for session, presents emergency room with complaint of abdominal distention and pain causing him to have back pain and shortness of breath.  States he was last drained 1 week ago, 7 L removed, is just not getting set up with GI and does not have a scheduled regularly.  Review of Systems  Positive: Abdominal pain, shortness of breath, back pain Negative: Fever  Physical Exam  BP (!) 90/52 (BP Location: Right Arm)   Pulse 83   Temp 98 F (36.7 C) (Oral)   Resp 20   SpO2 99%  Gen:   Awake, no distress   Resp:  Normal effort  MSK:   Moves extremities without difficulty  Other:  Appears uncomfortable, abdomen distended, afebrile  Medical Decision Making  Medically screening exam initiated at 11:10 AM.  Appropriate orders placed.  KACI FREEL was informed that the remainder of the evaluation will be completed by another provider, this initial triage assessment does not replace that evaluation, and the importance of remaining in the ED until their evaluation is complete.     Tacy Learn, PA-C 10/28/20 1111    Pattricia Boss, MD 10/29/20 [phone]

## 2020-10-28 NOTE — ED Provider Notes (Signed)
[facility] EMERGENCY DEPARTMENT Provider Note  CSN: [id number] Arrival date & time: 10/28/20 1005    History Chief Complaint  Patient presents with   Back Pain   Ascites    Vernon Blackburn is a 55 y.o. male with history of recurrent ascites requiring paracentesis reports increased abdominal swelling, back pain and trouble breathing similar to previous episodes of ascites. He denies any fever. ESRD patient who went to dialysis this morning. Here requesting paracentesis.    Past Medical History:  Diagnosis Date   ADHD    Anemia, chronic renal failure    Asthma    followed by pcp   Benign localized prostatic hyperplasia with lower urinary tract symptoms (LUTS)    Bladder stones    BPH (benign prostatic hyperplasia)    Chronic gout    05-06-2020  per pt last episode 6 months ago,  (involves right knee and right great toe)   Chronic systolic HF (heart failure) ([place])    followed by cardiology, dr Aundra Dubin @ [facility]F clinic,   last TEE 03-06-2019 in epic ef 45%   Dyspnea    ESRD on hemodialysis [facility])    primary nephrologist--- dr Moshe Cipro--- HD on M/W/ F  at [facility] in [place] murmur    History of bladder stone    History of cellulitis 01/27/2020   ED visit ,  abdominal wall   History of CVA (cerebrovascular accident) without residual deficits    remote infarct per imaging MRI brain in care every where 02-07-2019   History of kidney stones    History of obstructive sleep apnea    05-06-2020  per pt test positive for osa prior to gastric bypass in 2014 used cpap,  pt stated was retested after alot of weight loss , test was negative    Hypertension    followed by cardiology   NICM (nonischemic cardiomyopathy) ([place])    followed by cardiology---- 01/ 2018  ef 20-35%, severe MV stenosis;  last TEE echo in epic 03-06-2019  ef 45%   Peripheral edema    Bilateral lower extremity    S/P minimally invasive mitral valve repair followed by  cardiology   06-29-2016  for severe stenosis ;  30 mm [name] via right mini thoracotomy approach;  last TEE echo in epic 03-06-2019 ef 45%, mild LVH, post op mild MR with mean gradient 66mmHg and no significant stenosis   Seasonal and perennial allergic rhinitis    Sleep apnea     Past Surgical History:  Procedure Laterality Date   AV FISTULA PLACEMENT Left 03/10/2019   Procedure: LEFT ARM BASILIC ARTERIOVENOUS (AV) FISTULA CREATION FIRST STAGE;  Surgeon: Marty Heck, MD;  Location: [place];  Service: Vascular;  Laterality: Left;   BASCILIC VEIN TRANSPOSITION Left 05/01/2019   Procedure: LEFT SECOND STAGE [place].;  Surgeon: Marty Heck, MD;  Location: [place];  Service: Vascular;  Laterality: Left;   CYSTOSCOPY/URETEROSCOPY/HOLMIUM LASER/STENT PLACEMENT Right 07/17/2018   Procedure: CYSTOSCOPY/RETROGRADE/URETEROSCOPY/HOLMIUM LASER/STENT PLACEMENT/ CYSTOLITHALOPAXY;  Surgeon: Ceasar Mons, MD;  Location: [facility];  Service: Urology;  Laterality: Right;   GASTRIC BYPASS  01/2012   IR CATHETER TUBE CHANGE  08/05/2020   IR CATHETER TUBE CHANGE  09/07/2020   IR CATHETER TUBE CHANGE  10/05/2020   IR PARACENTESIS  07/13/2020   IR PARACENTESIS  09/07/2020   IR PARACENTESIS  09/15/2020   IR PARACENTESIS  10/11/2020   IR PARACENTESIS  10/28/2020   KNEE ARTHROSCOPY W/ MENISCAL REPAIR Right 05/2008   LAPAROSCOPIC INGUINAL HERNIA REPAIR Left 03-24-2015  @ [facility]   MITRAL VALVE REPAIR Right 06/29/2016   Procedure: MINIMALLY INVASIVE MITRAL VALVE REPAIR  (MVR);  Surgeon: [name], MD;  Location: [place];  Service: Open Heart Surgery;  Laterality: Right;   PERITONEAL CATHETER INSERTION  08-18-2019  @[facility]    AND OPEN RIGHT INGUINAL HERNIA REPAIR   PERITONEAL CATHETER REMOVAL  03-31-2020  @[facility]    RIGHT HEART CATH N/A 10/19/2020   Procedure: RIGHT HEART CATH;  Surgeon: Larey Dresser, MD;  Location: [facility] CV LAB;  Service:  Cardiovascular;  Laterality: N/A;   RIGHT/LEFT HEART CATH AND CORONARY ANGIOGRAPHY N/A 06/07/2016   Procedure: Right/Left Heart Cath and Coronary Angiography;  Surgeon: Larey Dresser, MD;  Location: [place] CV LAB;  Service: Cardiovascular;  Laterality: N/A;   TEE WITHOUT CARDIOVERSION N/A 05/04/2016   Procedure: TRANSESOPHAGEAL ECHOCARDIOGRAM (TEE);  Surgeon: Larey Dresser, MD;  Location: [facility];  Service: Cardiovascular;  Laterality: N/A;   TEE WITHOUT CARDIOVERSION N/A 06/29/2016   Procedure: TRANSESOPHAGEAL ECHOCARDIOGRAM (TEE);  Surgeon: [name], MD;  Location: [facility];  Service: Open Heart Surgery;  Laterality: N/A;   TEE WITHOUT CARDIOVERSION N/A 03/06/2019   Procedure: TRANSESOPHAGEAL ECHOCARDIOGRAM (TEE);  Surgeon: Larey Dresser, MD;  Location: [facility] ENDOSCOPY;  Service: Cardiovascular;  Laterality: N/A;   TRANSURETHRAL RESECTION OF PROSTATE N/A 05/25/2020   Procedure: TRANSURETHRAL RESECTION OF THE PROSTATE (TURP) WITH CYSTOLITHOLAPAXY;  Surgeon: Ceasar Mons, MD;  Location: [facility];  Service: Urology;  Laterality: N/A;   UMBILICAL HERNIA REPAIR  09-05-2012  @[facility]     Family History  Problem Relation Age of Onset   Diabetes Mother    Hypertension Mother    Heart failure Mother    Colon cancer Neg Hx    Esophageal cancer Neg Hx    Rectal cancer Neg Hx    Stomach cancer Neg Hx     Social History   Tobacco Use   Smoking status: Never   Smokeless tobacco: Never  Vaping Use   Vaping Use: Never used  Substance Use Topics   Alcohol use: Not Currently   Drug use: Never     Home Medications Prior to Admission medications   Medication Sig Start Date End Date Taking? Authorizing Provider  Albuterol Sulfate (PROAIR RESPICLICK) 956 (90 Base) MCG/ACT AEPB Inhale 2 puffs into the lungs every 6 (six) hours as needed (wheezing/shortness of breath).    [provider]  atorvastatin (LIPITOR) 20 MG tablet Take 20 mg by mouth daily. 04/09/20    [provider]  B Complex-C-Zn-Folic Acid (DIALYVITE 387 WITH ZINC) 0.8 MG TABS Take 1 tablet by mouth every evening. 07/27/20   [provider]  Budeson-Glycopyrrol-Formoterol (BREZTRI AEROSPHERE) 160-9-4.8 MCG/ACT AERO Inhale 2 puffs into the lungs 2 (two) times daily as needed (congestion/respiratory issues.).    [provider]  calcitRIOL (ROCALTROL) 0.5 MCG capsule Take 0.5 mcg by mouth every Monday, Wednesday, and Friday. 04/28/20   [provider]  calcium acetate (PHOSLO) 667 MG capsule Take 2,668 mg by mouth 3 (three) times daily with meals. 11/22/19   [provider]  carvedilol (COREG) 3.125 MG tablet Take 1 tablet (3.125 mg total) by mouth 2 (two) times daily with a meal. Patient taking differently: Take 3.125 mg by mouth every evening. 10/11/20   Larey Dresser, MD  Febuxostat 80 MG TABS Take 80 mg by mouth daily.  [provider]  fexofenadine (ALLEGRA) 180 MG tablet Take 180 mg by mouth daily as needed for allergies.    [provider]  furosemide (LASIX) 80 MG tablet Take 80 mg by mouth every evening. 06/30/20   [provider]  lactulose (CHRONULAC) 10 GM/15ML solution Take 10 g by mouth 2 (two) times daily as needed for mild constipation.    [provider]  lidocaine-prilocaine (EMLA) cream Apply 1 application topically daily as needed (prior to port being access). 07/27/20   [provider]  polyethylene glycol (MIRALAX / GLYCOLAX) 17 g packet Take 17 g by mouth daily as needed (constipation).    [provider]  tamsulosin (FLOMAX) 0.4 MG CAPS capsule Take 0.4 mg by mouth in the morning.    [provider]  VYVANSE 30 MG capsule Take 30 mg by mouth every morning. 06/21/19   [provider]     Allergies    Isosorbide   Review of Systems   Review of Systems A comprehensive review of systems was completed and negative except as noted in HPI.    Physical  Exam BP 108/86   Pulse 80   Temp 98 F (36.7 C) (Oral)   Resp 16   SpO2 95%   Physical Exam Vitals and nursing note reviewed.  Constitutional:      Appearance: Normal appearance.  HENT:     Head: Normocephalic and atraumatic.     Nose: Nose normal.     Mouth/Throat:     Mouth: Mucous membranes are moist.  Eyes:     Extraocular Movements: Extraocular movements intact.     Conjunctiva/sclera: Conjunctivae normal.  Cardiovascular:     Rate and Rhythm: Normal rate.  Pulmonary:     Effort: Pulmonary effort is normal.     Breath sounds: Normal breath sounds.  Abdominal:     General: Abdomen is flat. There is distension.     Palpations: Abdomen is soft.     Tenderness: There is abdominal tenderness (mild, diffuse, no guarding).  Musculoskeletal:        General: No swelling. Normal range of motion.     Cervical back: Neck supple.  Skin:    General: Skin is warm and dry.  Neurological:     General: No focal deficit present.     Mental Status: He is alert.  Psychiatric:        Mood and Affect: Mood normal.     ED Results / Procedures / Treatments   Labs (all labs ordered are listed, but only abnormal results are displayed) Labs Reviewed  COMPREHENSIVE METABOLIC PANEL - Abnormal; Notable for the following components:      Result Value   Chloride 96 (*)    CO2 34 (*)    BUN 28 (*)    Creatinine, Ser 7.18 (*)    Calcium 8.3 (*)    Total Protein 5.9 (*)    Albumin 2.6 (*)    AST 56 (*)    ALT 72 (*)    GFR, Estimated 8 (*)    All other components within normal limits  CBC WITH DIFFERENTIAL/PLATELET - Abnormal; Notable for the following components:   RBC 4.01 (*)    Hemoglobin 11.5 (*)    HCT 37.0 (*)    RDW 17.5 (*)    All other components within normal limits  RESP PANEL BY RT-PCR (FLU A&B, COVID) ARPGX2  LIPASE, BLOOD  PROTIME-INR    EKG EKG Interpretation  Date/Time:  Thursday  October 28 2020 10:38:51 EDT Ventricular Rate:  84 PR Interval:  178 QRS  Duration: 86 QT Interval:  410 QTC Calculation: 484 R Axis:   55 Text Interpretation: Normal sinus rhythm Right atrial enlargement Cannot rule out Anterior infarct , age undetermined Abnormal ECG No significant change since last tracing Confirmed by Calvert Cantor [phone]) on 10/28/2020 3:44:56 PM  Radiology IR Paracentesis  Result Date: 10/28/2020 INDICATION: End-stage renal disease and congestive heart failure with recurrent ascites. Request for therapeutic paracentesis. EXAM: ULTRASOUND GUIDED PARACENTESIS MEDICATIONS: 1% lidocaine 10 mL COMPLICATIONS: None immediate. PROCEDURE: Informed written consent was obtained from the patient after a discussion of the risks, benefits and alternatives to treatment. A timeout was performed prior to the initiation of the procedure. Initial ultrasound scanning demonstrates a large amount of ascites within the left lower abdominal quadrant. The left lower abdomen was prepped and draped in the usual sterile fashion. 1% lidocaine was used for local anesthesia. Following this, a 19 gauge, 7-cm, Yueh catheter was introduced. An ultrasound image was saved for documentation purposes. The paracentesis was performed. The catheter was removed and a dressing was applied. The patient tolerated the procedure well without immediate post procedural complication. Patient received post-procedure intravenous albumin; see nursing notes for details. FINDINGS: A total of approximately 6.7 of clear yellow fluid was removed. IMPRESSION: Successful ultrasound-guided paracentesis yielding 6.7 liters of peritoneal fluid. Read by: Gareth Eagle, PA-C Electronically Signed   By: Ruthann Cancer M.D.   On: 10/28/2020 16:44    Procedures Procedures  Medications Ordered in the ED Medications  lidocaine (PF) (XYLOCAINE) 1 % injection (10 mLs  Given 10/28/20 1601)  lidocaine (XYLOCAINE) 1 % (with pres) injection (has no administration in time range)  albumin human 25 % solution 50 g (50 g  Intravenous New Bag/Given 10/28/20 1730)     MDM Rules/Calculators/A&P MDM Labs ordered in triage reviewed and at baseline. Will discuss with IR whether they have the ability to drain his fluid today.   ED Course  I have reviewed the triage vital signs and the nursing notes.  Pertinent labs & imaging results that were available during my care of the patient were reviewed by me and considered in my medical decision making (see chart for details).  Clinical Course as of 10/28/20 1852  Thu Oct 28, 2020  1850 Paracentesis done, 6.5L removed, albumin infusion complete. He is ready to go home. Recommend he discuss regularly scheduled paracentesis instead of waiting until he is uncomfortable and coming to the ED. He understands.  [CS]    Clinical Course User Index [CS] Truddie Hidden, MD    Final Clinical Impression(s) / ED Diagnoses Final diagnoses:  Other ascites    Rx / DC Orders ED Discharge Orders     None        Truddie Hidden, MD 10/28/20 [phone]

## 2020-10-28 NOTE — ED Triage Notes (Signed)
Pt. Stated, I just left dialysis and I need to have this fluid drawn off my stomach ,. It causes me to have SOB and back pain. The last time fluid drawn off was 2 weeks ago.

## 2020-10-28 NOTE — ED Triage Notes (Signed)
Pt. Has only had Tylenol today. Takes Prednisone, and flexeril,  out of Hydrocodone for the last 2 days.

## 2020-10-30 DIAGNOSIS — Z992 Dependence on renal dialysis: Secondary | ICD-10-CM | POA: Diagnosis not present

## 2020-10-30 DIAGNOSIS — D689 Coagulation defect, unspecified: Secondary | ICD-10-CM | POA: Diagnosis not present

## 2020-10-30 DIAGNOSIS — N186 End stage renal disease: Secondary | ICD-10-CM | POA: Diagnosis not present

## 2020-11-01 DIAGNOSIS — N2581 Secondary hyperparathyroidism of renal origin: Secondary | ICD-10-CM | POA: Diagnosis not present

## 2020-11-01 DIAGNOSIS — D509 Iron deficiency anemia, unspecified: Secondary | ICD-10-CM | POA: Diagnosis not present

## 2020-11-01 DIAGNOSIS — I129 Hypertensive chronic kidney disease with stage 1 through stage 4 chronic kidney disease, or unspecified chronic kidney disease: Secondary | ICD-10-CM | POA: Diagnosis not present

## 2020-11-01 DIAGNOSIS — Z992 Dependence on renal dialysis: Secondary | ICD-10-CM | POA: Diagnosis not present

## 2020-11-01 DIAGNOSIS — D689 Coagulation defect, unspecified: Secondary | ICD-10-CM | POA: Diagnosis not present

## 2020-11-01 DIAGNOSIS — N186 End stage renal disease: Secondary | ICD-10-CM | POA: Diagnosis not present

## 2020-11-03 DIAGNOSIS — N99114 Postprocedural urethral stricture, male, unspecified: Secondary | ICD-10-CM | POA: Diagnosis not present

## 2020-11-03 DIAGNOSIS — I130 Hypertensive heart and chronic kidney disease with heart failure and stage 1 through stage 4 chronic kidney disease, or unspecified chronic kidney disease: Secondary | ICD-10-CM | POA: Diagnosis not present

## 2020-11-03 DIAGNOSIS — I5022 Chronic systolic (congestive) heart failure: Secondary | ICD-10-CM | POA: Diagnosis not present

## 2020-11-03 DIAGNOSIS — N189 Chronic kidney disease, unspecified: Secondary | ICD-10-CM | POA: Diagnosis not present

## 2020-11-04 DIAGNOSIS — D689 Coagulation defect, unspecified: Secondary | ICD-10-CM | POA: Diagnosis not present

## 2020-11-04 DIAGNOSIS — N186 End stage renal disease: Secondary | ICD-10-CM | POA: Diagnosis not present

## 2020-11-04 DIAGNOSIS — Z992 Dependence on renal dialysis: Secondary | ICD-10-CM | POA: Diagnosis not present

## 2020-11-04 DIAGNOSIS — N35011 Post-traumatic bulbous urethral stricture: Secondary | ICD-10-CM | POA: Diagnosis not present

## 2020-11-04 DIAGNOSIS — D509 Iron deficiency anemia, unspecified: Secondary | ICD-10-CM | POA: Diagnosis not present

## 2020-11-04 DIAGNOSIS — D631 Anemia in chronic kidney disease: Secondary | ICD-10-CM | POA: Diagnosis not present

## 2020-11-04 DIAGNOSIS — N2581 Secondary hyperparathyroidism of renal origin: Secondary | ICD-10-CM | POA: Diagnosis not present

## 2020-11-05 DIAGNOSIS — J45998 Other asthma: Secondary | ICD-10-CM | POA: Diagnosis not present

## 2020-11-05 DIAGNOSIS — R188 Other ascites: Secondary | ICD-10-CM | POA: Diagnosis not present

## 2020-11-05 DIAGNOSIS — Z992 Dependence on renal dialysis: Secondary | ICD-10-CM | POA: Diagnosis not present

## 2020-11-05 DIAGNOSIS — Z7682 Awaiting organ transplant status: Secondary | ICD-10-CM | POA: Diagnosis not present

## 2020-11-05 DIAGNOSIS — I5022 Chronic systolic (congestive) heart failure: Secondary | ICD-10-CM | POA: Diagnosis not present

## 2020-11-05 DIAGNOSIS — Z9889 Other specified postprocedural states: Secondary | ICD-10-CM | POA: Diagnosis not present

## 2020-11-05 DIAGNOSIS — Z8673 Personal history of transient ischemic attack (TIA), and cerebral infarction without residual deficits: Secondary | ICD-10-CM | POA: Diagnosis not present

## 2020-11-05 DIAGNOSIS — I132 Hypertensive heart and chronic kidney disease with heart failure and with stage 5 chronic kidney disease, or end stage renal disease: Secondary | ICD-10-CM | POA: Diagnosis not present

## 2020-11-05 DIAGNOSIS — Z9884 Bariatric surgery status: Secondary | ICD-10-CM | POA: Diagnosis not present

## 2020-11-05 DIAGNOSIS — Z9359 Other cystostomy status: Secondary | ICD-10-CM | POA: Diagnosis not present

## 2020-11-05 DIAGNOSIS — N186 End stage renal disease: Secondary | ICD-10-CM | POA: Diagnosis not present

## 2020-11-05 DIAGNOSIS — R338 Other retention of urine: Secondary | ICD-10-CM | POA: Diagnosis not present

## 2020-11-08 DIAGNOSIS — N2581 Secondary hyperparathyroidism of renal origin: Secondary | ICD-10-CM | POA: Diagnosis not present

## 2020-11-08 DIAGNOSIS — Z992 Dependence on renal dialysis: Secondary | ICD-10-CM | POA: Diagnosis not present

## 2020-11-08 DIAGNOSIS — D631 Anemia in chronic kidney disease: Secondary | ICD-10-CM | POA: Diagnosis not present

## 2020-11-08 DIAGNOSIS — N186 End stage renal disease: Secondary | ICD-10-CM | POA: Diagnosis not present

## 2020-11-08 DIAGNOSIS — D509 Iron deficiency anemia, unspecified: Secondary | ICD-10-CM | POA: Diagnosis not present

## 2020-11-08 DIAGNOSIS — D689 Coagulation defect, unspecified: Secondary | ICD-10-CM | POA: Diagnosis not present

## 2020-11-10 DIAGNOSIS — D509 Iron deficiency anemia, unspecified: Secondary | ICD-10-CM | POA: Diagnosis not present

## 2020-11-10 DIAGNOSIS — N186 End stage renal disease: Secondary | ICD-10-CM | POA: Diagnosis not present

## 2020-11-10 DIAGNOSIS — D689 Coagulation defect, unspecified: Secondary | ICD-10-CM | POA: Diagnosis not present

## 2020-11-10 DIAGNOSIS — N2581 Secondary hyperparathyroidism of renal origin: Secondary | ICD-10-CM | POA: Diagnosis not present

## 2020-11-10 DIAGNOSIS — D631 Anemia in chronic kidney disease: Secondary | ICD-10-CM | POA: Diagnosis not present

## 2020-11-10 DIAGNOSIS — Z992 Dependence on renal dialysis: Secondary | ICD-10-CM | POA: Diagnosis not present

## 2020-11-11 ENCOUNTER — Other Ambulatory Visit (HOSPITAL_COMMUNITY): Payer: Self-pay | Admitting: Nephrology

## 2020-11-11 ENCOUNTER — Other Ambulatory Visit: Payer: Self-pay | Admitting: Nephrology

## 2020-11-11 DIAGNOSIS — R188 Other ascites: Secondary | ICD-10-CM

## 2020-11-12 DIAGNOSIS — D631 Anemia in chronic kidney disease: Secondary | ICD-10-CM | POA: Diagnosis not present

## 2020-11-12 DIAGNOSIS — N186 End stage renal disease: Secondary | ICD-10-CM | POA: Diagnosis not present

## 2020-11-12 DIAGNOSIS — N2581 Secondary hyperparathyroidism of renal origin: Secondary | ICD-10-CM | POA: Diagnosis not present

## 2020-11-12 DIAGNOSIS — D689 Coagulation defect, unspecified: Secondary | ICD-10-CM | POA: Diagnosis not present

## 2020-11-12 DIAGNOSIS — D509 Iron deficiency anemia, unspecified: Secondary | ICD-10-CM | POA: Diagnosis not present

## 2020-11-12 DIAGNOSIS — Z992 Dependence on renal dialysis: Secondary | ICD-10-CM | POA: Diagnosis not present

## 2020-11-15 DIAGNOSIS — D509 Iron deficiency anemia, unspecified: Secondary | ICD-10-CM | POA: Diagnosis not present

## 2020-11-15 DIAGNOSIS — D689 Coagulation defect, unspecified: Secondary | ICD-10-CM | POA: Diagnosis not present

## 2020-11-15 DIAGNOSIS — N2581 Secondary hyperparathyroidism of renal origin: Secondary | ICD-10-CM | POA: Diagnosis not present

## 2020-11-15 DIAGNOSIS — N186 End stage renal disease: Secondary | ICD-10-CM | POA: Diagnosis not present

## 2020-11-15 DIAGNOSIS — D631 Anemia in chronic kidney disease: Secondary | ICD-10-CM | POA: Diagnosis not present

## 2020-11-15 DIAGNOSIS — Z992 Dependence on renal dialysis: Secondary | ICD-10-CM | POA: Diagnosis not present

## 2020-11-16 ENCOUNTER — Ambulatory Visit (HOSPITAL_COMMUNITY)
Admission: RE | Admit: 2020-11-16 | Discharge: 2020-11-16 | Disposition: A | Discharge status: Home / Self Care | Payer: Medicare Other | Source: Ambulatory Visit | Attending: Nephrology | Admitting: Nephrology

## 2020-11-16 ENCOUNTER — Other Ambulatory Visit: Payer: Self-pay

## 2020-11-16 DIAGNOSIS — R188 Other ascites: Secondary | ICD-10-CM | POA: Insufficient documentation

## 2020-11-16 DIAGNOSIS — I5020 Unspecified systolic (congestive) heart failure: Secondary | ICD-10-CM | POA: Diagnosis not present

## 2020-11-16 HISTORY — PX: IR PARACENTESIS: IMG2679

## 2020-11-16 MED ORDER — LIDOCAINE HCL 1 % IJ SOLN
INTRAMUSCULAR | Status: AC
  Filled 2020-11-16: qty 20

## 2020-11-16 MED ORDER — LIDOCAINE HCL (PF) 1 % IJ SOLN
INTRAMUSCULAR | Status: DC | PRN
  Administered 2020-11-16: 10 mL

## 2020-11-16 NOTE — Procedures (Signed)
Ultrasound-guided therapeutic paracentesis performed yielding 5.4 liters of straw colored fluid.  No immediate complications. EBL is none.

## 2020-11-17 DIAGNOSIS — N2581 Secondary hyperparathyroidism of renal origin: Secondary | ICD-10-CM | POA: Diagnosis not present

## 2020-11-17 DIAGNOSIS — D509 Iron deficiency anemia, unspecified: Secondary | ICD-10-CM | POA: Diagnosis not present

## 2020-11-17 DIAGNOSIS — Z992 Dependence on renal dialysis: Secondary | ICD-10-CM | POA: Diagnosis not present

## 2020-11-17 DIAGNOSIS — D689 Coagulation defect, unspecified: Secondary | ICD-10-CM | POA: Diagnosis not present

## 2020-11-17 DIAGNOSIS — N186 End stage renal disease: Secondary | ICD-10-CM | POA: Diagnosis not present

## 2020-11-17 DIAGNOSIS — D631 Anemia in chronic kidney disease: Secondary | ICD-10-CM | POA: Diagnosis not present

## 2020-11-19 DIAGNOSIS — D631 Anemia in chronic kidney disease: Secondary | ICD-10-CM | POA: Diagnosis not present

## 2020-11-19 DIAGNOSIS — N186 End stage renal disease: Secondary | ICD-10-CM | POA: Diagnosis not present

## 2020-11-19 DIAGNOSIS — D689 Coagulation defect, unspecified: Secondary | ICD-10-CM | POA: Diagnosis not present

## 2020-11-19 DIAGNOSIS — Z992 Dependence on renal dialysis: Secondary | ICD-10-CM | POA: Diagnosis not present

## 2020-11-19 DIAGNOSIS — N2581 Secondary hyperparathyroidism of renal origin: Secondary | ICD-10-CM | POA: Diagnosis not present

## 2020-11-19 DIAGNOSIS — D509 Iron deficiency anemia, unspecified: Secondary | ICD-10-CM | POA: Diagnosis not present

## 2020-11-21 DIAGNOSIS — Z992 Dependence on renal dialysis: Secondary | ICD-10-CM | POA: Diagnosis not present

## 2020-11-21 DIAGNOSIS — D689 Coagulation defect, unspecified: Secondary | ICD-10-CM | POA: Diagnosis not present

## 2020-11-21 DIAGNOSIS — N2581 Secondary hyperparathyroidism of renal origin: Secondary | ICD-10-CM | POA: Diagnosis not present

## 2020-11-21 DIAGNOSIS — D509 Iron deficiency anemia, unspecified: Secondary | ICD-10-CM | POA: Diagnosis not present

## 2020-11-21 DIAGNOSIS — D631 Anemia in chronic kidney disease: Secondary | ICD-10-CM | POA: Diagnosis not present

## 2020-11-21 DIAGNOSIS — N186 End stage renal disease: Secondary | ICD-10-CM | POA: Diagnosis not present

## 2020-11-22 DIAGNOSIS — Z6822 Body mass index (BMI) 22.0-22.9, adult: Secondary | ICD-10-CM | POA: Diagnosis not present

## 2020-11-22 DIAGNOSIS — M109 Gout, unspecified: Secondary | ICD-10-CM | POA: Diagnosis not present

## 2020-11-22 DIAGNOSIS — G4730 Sleep apnea, unspecified: Secondary | ICD-10-CM | POA: Diagnosis not present

## 2020-11-22 DIAGNOSIS — N186 End stage renal disease: Secondary | ICD-10-CM | POA: Diagnosis not present

## 2020-11-22 DIAGNOSIS — J4530 Mild persistent asthma, uncomplicated: Secondary | ICD-10-CM | POA: Diagnosis not present

## 2020-11-22 DIAGNOSIS — I10 Essential (primary) hypertension: Secondary | ICD-10-CM | POA: Diagnosis not present

## 2020-11-22 DIAGNOSIS — N138 Other obstructive and reflux uropathy: Secondary | ICD-10-CM | POA: Diagnosis not present

## 2020-11-22 DIAGNOSIS — Z992 Dependence on renal dialysis: Secondary | ICD-10-CM | POA: Diagnosis not present

## 2020-11-22 DIAGNOSIS — N99116 Postprocedural urethral stricture, male, overlapping sites: Secondary | ICD-10-CM | POA: Diagnosis not present

## 2020-11-23 ENCOUNTER — Other Ambulatory Visit (INDEPENDENT_AMBULATORY_CARE_PROVIDER_SITE_OTHER): Payer: Medicare Other

## 2020-11-23 ENCOUNTER — Ambulatory Visit (INDEPENDENT_AMBULATORY_CARE_PROVIDER_SITE_OTHER): Payer: Medicare Other | Admitting: Gastroenterology

## 2020-11-23 ENCOUNTER — Encounter: Payer: Self-pay | Admitting: Gastroenterology

## 2020-11-23 ENCOUNTER — Other Ambulatory Visit: Payer: Self-pay

## 2020-11-23 VITALS — BP 98/62 | Ht 69.0 in | Wt 161.2 lb

## 2020-11-23 DIAGNOSIS — D509 Iron deficiency anemia, unspecified: Secondary | ICD-10-CM | POA: Diagnosis not present

## 2020-11-23 DIAGNOSIS — R188 Other ascites: Secondary | ICD-10-CM

## 2020-11-23 DIAGNOSIS — D689 Coagulation defect, unspecified: Secondary | ICD-10-CM | POA: Diagnosis not present

## 2020-11-23 DIAGNOSIS — N186 End stage renal disease: Secondary | ICD-10-CM | POA: Diagnosis not present

## 2020-11-23 DIAGNOSIS — Z992 Dependence on renal dialysis: Secondary | ICD-10-CM

## 2020-11-23 DIAGNOSIS — Z9889 Other specified postprocedural states: Secondary | ICD-10-CM | POA: Diagnosis not present

## 2020-11-23 DIAGNOSIS — I10 Essential (primary) hypertension: Secondary | ICD-10-CM | POA: Diagnosis not present

## 2020-11-23 DIAGNOSIS — N2581 Secondary hyperparathyroidism of renal origin: Secondary | ICD-10-CM | POA: Diagnosis not present

## 2020-11-23 DIAGNOSIS — I5022 Chronic systolic (congestive) heart failure: Secondary | ICD-10-CM

## 2020-11-23 DIAGNOSIS — I420 Dilated cardiomyopathy: Secondary | ICD-10-CM

## 2020-11-23 DIAGNOSIS — D631 Anemia in chronic kidney disease: Secondary | ICD-10-CM | POA: Diagnosis not present

## 2020-11-23 LAB — HEPATIC FUNCTION PANEL
ALT: 57 U/L — ABNORMAL HIGH (ref 0–53)
AST: 38 U/L — ABNORMAL HIGH (ref 0–37)
Albumin: 3.3 g/dL — ABNORMAL LOW (ref 3.5–5.2)
Alkaline Phosphatase: 66 U/L (ref 39–117)
Bilirubin, Direct: 0.1 mg/dL (ref 0.0–0.3)
Total Bilirubin: 0.4 mg/dL (ref 0.2–1.2)
Total Protein: 5.6 g/dL — ABNORMAL LOW (ref 6.0–8.3)

## 2020-11-23 NOTE — Patient Instructions (Signed)
If you are age 54 or older, your body mass index should be between 23-30. Your Body mass index is 23.81 kg/m. If this is out of the aforementioned range listed, please consider follow up with your Primary Care Provider.  If you are age 28 or younger, your body mass index should be between 19-25. Your Body mass index is 23.81 kg/m. If this is out of the aformentioned range listed, please consider follow up with your Primary Care Provider.   __________________________________________________________  The [facility] providers would like to encourage you to use [facility] to communicate with providers for non-urgent requests or questions.  Due to long hold times on the telephone, sending your provider a message by [facility] may be a faster and more efficient way to get a response.  Please allow 48 business hours for a response.  Please remember that this is for non-urgent requests.   Please go to the lab on the 2nd floor [address] before you leave the office today.   Please go to the 1st floor radiology and schedule your Ultrasound.   We would like to do a colonoscopy in February 2023 at [facility]. We will be in touch to schedule this procedure.  Thank you for choosing me and [facility].  Vito Cirigliano, D.O.

## 2020-11-23 NOTE — Progress Notes (Signed)
Chief Complaint: Ascites   Referring Provider:    Loralie Champagne, MD    HPI:     Vernon Blackburn is a 55 y.o. male with a history of cardiomyopathy with CHF EF 50-55 %, ESRD on HD MWF (was previously peritoneal dialysis x8 months until 03/2020), CVA, AV fistula, gout, asthma, anemia of chronic disease, OSA, HTN, mitral valve repair, BPH  s/p TURP then ureteral obstruction with suprapubic catheter placement 07/2020, gastric bypass, referred to the [facility] for evaluation of new onset ascites.  Has recently undergone several paracentesis, including 5.4 L removed on 11/16/2020.  Has been undergoing large-volume paracentesis approximately every 2 weeks, with initial diagnostic/therapeutic paracentesis on 07/13/2020 (6.7 L removed, negative for SBP. Low SAAG, high protein (3.7)).  Does have SOB and abdominal distension/fluid wave, which all improves with biweekly paracentesis. Sxs will recur slowly as fluid accumulates. No prior hx of ascites, and no known hx of liver disease.   Currently prescribed lactulose twice daily as needed for constipation (no history of encephalopathy), furosemide 80 mg/day (still produces a small amount of urine).   Reviewed recent labs dated 10/28/2020 which are notable for the following: - INR 1.0 - Normal lipase - H/H 11.5/37 with MCV/RDW 92.3/17.5 (H/H stable) - PLT 239 - Albumin 2.6, AST/ALT 56/72, T bili 0.3, ALP 55 - NA 139, K3.6 - BUN/creatinine 28/7.2 (ESRD)  Labs from 07/13/2020: - H/H11.5/36.7 - PLT 217 - Albumin 2.9, AST/ALT 22/13, T bili 0.8, ALP 41 - NA 140, K3.1  -03/02/2020: CT abdomen/pelvis: Gallstones, 1.1 cm hypodense lesion in peripheral right hepatic lobe (probably cyst), unremarkable pancreas, spleen.  Large amount of free intraperitoneal gas without clear perforation site and gas in subcutaneous tissue tracking around peritoneal dialysis catheter.   Right heart cardiac catheterization on 10/19/2020 by Dr.  Aundra Dubin.  Low filling pressures, normal PA pressure, normal cardiac output.  "Ascites does not appear to be due to right heart failure.  Suspect primary hepatic problem".  Last TTE was 07/2020: EF 50-55%.  Mitral valve repaired/replaced.  Has appointment with [facility] tomorrow regarding suprapubic catheter.  Endoscopic History: - Colonoscopy (12/2017): Moderate stool with fair visualization.  Internal hemorrhoids.  Normal TI.  Recommend repeat in 1-2 years due to suboptimal bowel prep   Past Medical History:  Diagnosis Date   ADHD    Anemia, chronic renal failure    Asthma    followed by pcp   Benign localized prostatic hyperplasia with lower urinary tract symptoms (LUTS)    Bladder stones    BPH (benign prostatic hyperplasia)    Chronic gout    05-06-2020  per pt last episode 6 months ago,  (involves right knee and right great toe)   Chronic systolic HF (heart failure) ([place])    followed by cardiology, dr Aundra Dubin @ CHF clinic,   last TEE 03-06-2019 in epic ef 45%   Dyspnea    ESRD on hemodialysis [facility])    primary nephrologist--- dr Moshe Cipro--- HD on M/W/ F  at [place] at [facility] in [place] murmur    History of bladder stone    History of cellulitis 01/27/2020   ED visit ,  abdominal wall   History of CVA (cerebrovascular accident) without residual deficits    remote infarct per imaging MRI brain in care every where 02-07-2019   History of kidney stones    History of obstructive sleep apnea  05-06-2020  per pt test positive for osa prior to gastric bypass in 2014 used cpap,  pt stated was retested after alot of weight loss , test was negative    Hypertension    followed by cardiology   NICM (nonischemic cardiomyopathy) ([place])    followed by cardiology---- 01/ 2018  ef 20-35%, severe MV stenosis;  last TEE echo in epic 03-06-2019  ef 45%   Peripheral edema    Bilateral lower extremity    S/P minimally invasive mitral valve repair followed by  cardiology   06-29-2016  for severe stenosis ;  30 mm [name] via right mini thoracotomy approach;  last TEE echo in epic 03-06-2019 ef 45%, mild LVH, post op mild MR with mean gradient 3mmHg and no significant stenosis   Seasonal and perennial allergic rhinitis    Sleep apnea      Past Surgical History:  Procedure Laterality Date   AV FISTULA PLACEMENT Left 03/10/2019   Procedure: LEFT ARM BASILIC ARTERIOVENOUS (AV) FISTULA CREATION FIRST STAGE;  Surgeon: Marty Heck, MD;  Location: [place];  Service: Vascular;  Laterality: Left;   [place] Left 05/01/2019   Procedure: LEFT SECOND STAGE [address]  Surgeon: Marty Heck, MD;  Location: [place];  Service: Vascular;  Laterality: Left;   CYSTOSCOPY/URETEROSCOPY/HOLMIUM LASER/STENT PLACEMENT Right 07/17/2018   Procedure: CYSTOSCOPY/RETROGRADE/URETEROSCOPY/HOLMIUM LASER/STENT PLACEMENT/ CYSTOLITHALOPAXY;  Surgeon: Ceasar Mons, MD;  Location: [facility];  Service: Urology;  Laterality: Right;   GASTRIC BYPASS  01/2012   IR CATHETER TUBE CHANGE  08/05/2020   IR CATHETER TUBE CHANGE  09/07/2020   IR CATHETER TUBE CHANGE  10/05/2020   IR PARACENTESIS  07/13/2020   IR PARACENTESIS  09/07/2020   IR PARACENTESIS  09/15/2020   IR PARACENTESIS  10/11/2020   IR PARACENTESIS  10/28/2020   IR PARACENTESIS  11/16/2020   KNEE ARTHROSCOPY W/ MENISCAL REPAIR Right 05/2008   LAPAROSCOPIC INGUINAL HERNIA REPAIR Left 03-24-2015  @ [facility]   MITRAL VALVE REPAIR Right 06/29/2016   Procedure: MINIMALLY INVASIVE MITRAL VALVE REPAIR  (MVR);  Surgeon: [name], MD;  Location: [place];  Service: Open Heart Surgery;  Laterality: Right;   PERITONEAL CATHETER INSERTION  08-18-2019  @[facility]    AND OPEN RIGHT INGUINAL HERNIA REPAIR   PERITONEAL CATHETER REMOVAL  03-31-2020  @[facility]    RIGHT HEART CATH N/A 10/19/2020   Procedure: RIGHT HEART CATH;  Surgeon: Larey Dresser, MD;  Location: [place]  CV [facility];  Service: Cardiovascular;  Laterality: N/A;   RIGHT/LEFT HEART CATH AND CORONARY ANGIOGRAPHY N/A 06/07/2016   Procedure: Right/Left Heart Cath and Coronary Angiography;  Surgeon: Larey Dresser, MD;  Location: [place] [facility];  Service: Cardiovascular;  Laterality: N/A;   TEE WITHOUT CARDIOVERSION N/A 05/04/2016   Procedure: TRANSESOPHAGEAL ECHOCARDIOGRAM (TEE);  Surgeon: Larey Dresser, MD;  Location: [facility] ENDOS[facility];  Service: Cardiovascular;  Laterality: N/A;   TEE WITHOUT CARDIOVERSION N/A 06/29/2016   Procedure: TRANSESOPHAGEAL ECHOCARDIOGRAM (TEE);  Surgeon: [name], MD;  Location: [place];  Service: Open Heart Surgery;  Laterality: N/A;   TEE WITHOUT CARDIOVERSION N/A 03/06/2019   Procedure: TRANSESOPHAGEAL ECHOCARDIOGRAM (TEE);  Surgeon: Larey Dresser, MD;  Location: [facility];  Service: Cardiovascular;  Laterality: N/A;   TRANSURETHRAL RESECTION OF PROSTATE N/A 05/25/2020   Procedure: TRANSURETHRAL RESECTION OF THE PROSTATE (TURP) WITH CYSTOLITHOLAPAXY;  Surgeon: Ceasar Mons, MD;  Location[facility];  Service: Urology;  Laterality: N/A;  UMBILICAL HERNIA REPAIR  09-05-2012  @[facility]    Family History  Problem Relation Age of Onset   Diabetes Mother    Hypertension Mother    Heart failure Mother    Colon cancer Neg Hx    Esophageal cancer Neg Hx    Rectal cancer Neg Hx    Stomach cancer Neg Hx    Social History   Tobacco Use   Smoking status: Never   Smokeless tobacco: Never  Vaping Use   Vaping Use: Never used  Substance Use Topics   Alcohol use: Not Currently   Drug use: Never   Current Outpatient Medications  Medication Sig Dispense Refill   Albuterol Sulfate (PROAIR RESPICLICK) 397 (90 Base) MCG/ACT AEPB Inhale 2 puffs into the lungs every 6 (six) hours as needed (wheezing/shortness of breath).     atorvastatin (LIPITOR) 20 MG tablet Take 20 mg by mouth daily.     B Complex-C-Zn-Folic Acid (DIALYVITE 673 WITH ZINC) 0.8 MG TABS Take 1 tablet  by mouth every evening.     Budeson-Glycopyrrol-Formoterol (BREZTRI AEROSPHERE) 160-9-4.8 MCG/ACT AERO Inhale 2 puffs into the lungs 2 (two) times daily as needed (congestion/respiratory issues.).     calcitRIOL (ROCALTROL) 0.5 MCG capsule Take 0.5 mcg by mouth every Monday, Wednesday, and Friday.     calcium acetate (PHOSLO) 667 MG capsule Take 2,668 mg by mouth 3 (three) times daily with meals.     carvedilol (COREG) 3.125 MG tablet Take 1 tablet (3.125 mg total) by mouth 2 (two) times daily with a meal. (Patient taking differently: Take 3.125 mg by mouth every evening.) 180 tablet 3   Febuxostat 80 MG TABS Take 80 mg by mouth daily.     fexofenadine (ALLEGRA) 180 MG tablet Take 180 mg by mouth daily as needed for allergies.     furosemide (LASIX) 80 MG tablet Take 80 mg by mouth every evening.     lactulose (CHRONULAC) 10 GM/15ML solution Take 10 g by mouth 2 (two) times daily as needed for mild constipation.     lidocaine-prilocaine (EMLA) cream Apply 1 application topically daily as needed (prior to port being access).     polyethylene glycol (MIRALAX / GLYCOLAX) 17 g packet Take 17 g by mouth daily as needed (constipation every other day).     tamsulosin (FLOMAX) 0.4 MG CAPS capsule Take 0.4 mg by mouth in the morning.     VYVANSE 30 MG capsule Take 30 mg by mouth every morning.     No current facility-administered medications for this visit.   Allergies  Allergen Reactions   Isosorbide     Hypotension      Review of Systems: All systems reviewed and negative except where noted in HPI.     Physical Exam:    Wt Readings from Last 3 Encounters:  11/23/20 161 lb 4 oz (73.1 kg)  10/19/20 160 lb (72.6 kg)  10/11/20 166 lb (75.3 kg)    BP 98/62   Ht 5\' 9"  (1.753 m)   Wt 161 lb 4 oz (73.1 kg)   BMI 23.81 kg/m  Constitutional:  Pleasant, in no acute distress. Psychiatric: Normal mood and affect. Behavior is normal. EENT: Pupils normal.  Conjunctivae are normal. No scleral  icterus. Neck supple. No cervical LAD. Cardiovascular: Normal rate, regular rhythm. No edema Pulmonary/chest: Effort normal and breath sounds normal. No wheezing, rales or rhonchi. Abdominal: Soft, nondistended, nontender. Bowel sounds active throughout. There are no masses palpable. No hepatomegaly. Neurological: Alert and oriented to person place  and time. Skin: Skin is warm and dry. No rashes noted.   ASSESSMENT AND PLAN;   1) New onset ascites Discussed potential DDx for new onset/recurrent ascites which is now paracentesis dependent.  No prior history of known liver disease and up until recent mild enzyme elevation, has had normal liver enzymes.  Otherwise, CT with normal-appearing liver and preserved hepatic synthetic function by labs (low albumin also attributable to ESRD, otherwise normal INR, PLT, T bili).  Initial paracentesis with low SAAG and high protein.  However, recent right heart cath not suggestive of cardiac etiology (and truly should see high SAAG with cardiogenic ascites).  Instead, possibly nephrogenic ascites given low SAAG and ESRD.  We will plan for the following:  - Repeat liver enzymes  - AMA, ANA, ASMA, A1AT, Hepatitis A, B, C evaluation, ceruloplasmin, anti-LKM Ab , tTG  - Immunoglobulin levels - Continue serial therapeutic paracentesis - RUQ [place] with Dopplers to ensure patent hepatic and portal veins - If above work-up unrevealing, this should again be most suggestive of nephrogenic ascites which may be responsive to more intensive dialysis.  Additionally, patient listed for renal transplant at [facility] (although listing on hold pending this evaluation)  2) ESRD 3) Suprapubic catheter - Continue HD as scheduled - Has appoint with [facility] tomorrow for management of urethral stricture and suprapubic cath  4) Ischemic cardiomyopathy 5) CHF 6) Mitral valve repair 7) HTN - Follows closely in the heart failure clinic with [name]  8) Constipation - Is prescribed  lactulose prn for constipation with good response  9) Colon cancer screening - Colonoscopy in 2019 with suboptimal bowel prep.  Due for repeat. -Will schedule colonoscopy to be done at [facility] unit due to elevated periprocedural risks -Plan for extended bowel preparation with Dulcolax 10 mg twice daily x2 days prior to starting prep  The indications, risks, and benefits of colonoscopy were explained to the patient in detail. Risks include but are not limited to bleeding, perforation, adverse reaction to medications, and cardiopulmonary compromise. Sequelae include but are not limited to the possibility of surgery, hospitalization, and mortality. The patient verbalized understanding and wished to proceed. All questions answered, referred to the scheduler and bowel prep ordered. Further recommendations pending results of the exam.   I spent 45 minutes of time, including in depth chart review, independent review of results as outlined above, communicating results with the patient directly, face-to-face time with the patient, coordinating care, ordering studies and medications as appropriate, and documentation.   Lavena Bullion, DO, FACG  11/23/2020, 9:04 AM   [name], MD

## 2020-11-24 DIAGNOSIS — I130 Hypertensive heart and chronic kidney disease with heart failure and stage 1 through stage 4 chronic kidney disease, or unspecified chronic kidney disease: Secondary | ICD-10-CM | POA: Diagnosis not present

## 2020-11-24 DIAGNOSIS — N99114 Postprocedural urethral stricture, male, unspecified: Secondary | ICD-10-CM | POA: Diagnosis not present

## 2020-11-24 DIAGNOSIS — I429 Cardiomyopathy, unspecified: Secondary | ICD-10-CM | POA: Diagnosis not present

## 2020-11-24 DIAGNOSIS — N184 Chronic kidney disease, stage 4 (severe): Secondary | ICD-10-CM | POA: Diagnosis not present

## 2020-11-24 DIAGNOSIS — I5022 Chronic systolic (congestive) heart failure: Secondary | ICD-10-CM | POA: Diagnosis not present

## 2020-11-24 DIAGNOSIS — J45909 Unspecified asthma, uncomplicated: Secondary | ICD-10-CM | POA: Diagnosis not present

## 2020-11-24 DIAGNOSIS — I10 Essential (primary) hypertension: Secondary | ICD-10-CM | POA: Diagnosis not present

## 2020-11-26 DIAGNOSIS — D689 Coagulation defect, unspecified: Secondary | ICD-10-CM | POA: Diagnosis not present

## 2020-11-26 DIAGNOSIS — Z992 Dependence on renal dialysis: Secondary | ICD-10-CM | POA: Diagnosis not present

## 2020-11-26 DIAGNOSIS — D509 Iron deficiency anemia, unspecified: Secondary | ICD-10-CM | POA: Diagnosis not present

## 2020-11-26 DIAGNOSIS — N2581 Secondary hyperparathyroidism of renal origin: Secondary | ICD-10-CM | POA: Diagnosis not present

## 2020-11-26 DIAGNOSIS — D631 Anemia in chronic kidney disease: Secondary | ICD-10-CM | POA: Diagnosis not present

## 2020-11-26 DIAGNOSIS — N186 End stage renal disease: Secondary | ICD-10-CM | POA: Diagnosis not present

## 2020-11-29 DIAGNOSIS — D631 Anemia in chronic kidney disease: Secondary | ICD-10-CM | POA: Diagnosis not present

## 2020-11-29 DIAGNOSIS — N186 End stage renal disease: Secondary | ICD-10-CM | POA: Diagnosis not present

## 2020-11-29 DIAGNOSIS — D509 Iron deficiency anemia, unspecified: Secondary | ICD-10-CM | POA: Diagnosis not present

## 2020-11-29 DIAGNOSIS — Z992 Dependence on renal dialysis: Secondary | ICD-10-CM | POA: Diagnosis not present

## 2020-11-29 DIAGNOSIS — D689 Coagulation defect, unspecified: Secondary | ICD-10-CM | POA: Diagnosis not present

## 2020-11-29 DIAGNOSIS — N2581 Secondary hyperparathyroidism of renal origin: Secondary | ICD-10-CM | POA: Diagnosis not present

## 2020-11-29 LAB — HEPATITIS C ANTIBODY
Hepatitis C Ab: NONREACTIVE
SIGNAL TO CUT-OFF: 0.06 (ref <1.00)

## 2020-11-29 LAB — HEPATITIS A ANTIBODY, TOTAL: Hepatitis A AB,Total: NONREACTIVE

## 2020-11-29 LAB — MITOCHONDRIAL ANTIBODIES: Mitochondrial M2 Ab, IgG: <=20 U (ref <=20.0)

## 2020-11-29 LAB — IGG: IgG (Immunoglobin G), Serum: 843 mg/dL (ref 600–1640)

## 2020-11-29 LAB — IGA: Immunoglobulin A: 252 mg/dL (ref 47–310)

## 2020-11-29 LAB — HEPATITIS B SURFACE ANTIGEN: Hepatitis B Surface Ag: NONREACTIVE

## 2020-11-29 LAB — IGM: IgM, Serum: 79 mg/dL (ref 50–300)

## 2020-11-29 LAB — ANTI-SMOOTH MUSCLE ANTIBODY, IGG: Actin (Smooth Muscle) Antibody (IGG): <20 U (ref <20)

## 2020-11-29 LAB — ALPHA-1-ANTITRYPSIN: A-1 Antitrypsin, Ser: 181 mg/dL (ref 83–199)

## 2020-11-29 LAB — CERULOPLASMIN: Ceruloplasmin: 21 mg/dL (ref 18–36)

## 2020-11-29 LAB — HEPATITIS B SURFACE ANTIBODY,QUALITATIVE: Hep B S Ab: REACTIVE — AB

## 2020-11-29 LAB — ANA: Anti Nuclear Antibody (ANA): NEGATIVE

## 2020-11-29 LAB — TISSUE TRANSGLUTAMINASE, IGA: (tTG) Ab, IgA: <1 U/mL

## 2020-11-29 LAB — ANTI-MICROSOMAL ANTIBODY LIVER / KIDNEY: LKM1 Ab: <=20 U (ref <=20.0)

## 2020-11-30 ENCOUNTER — Ambulatory Visit (HOSPITAL_BASED_OUTPATIENT_CLINIC_OR_DEPARTMENT_OTHER): Payer: Medicare Other

## 2020-11-30 ENCOUNTER — Ambulatory Visit (HOSPITAL_BASED_OUTPATIENT_CLINIC_OR_DEPARTMENT_OTHER)
Admission: RE | Admit: 2020-11-30 | Discharge: 2020-11-30 | Disposition: A | Discharge status: Home / Self Care | Payer: Medicare Other | Source: Ambulatory Visit | Attending: Gastroenterology | Admitting: Gastroenterology

## 2020-11-30 ENCOUNTER — Other Ambulatory Visit: Payer: Self-pay

## 2020-11-30 DIAGNOSIS — K8020 Calculus of gallbladder without cholecystitis without obstruction: Secondary | ICD-10-CM | POA: Diagnosis not present

## 2020-11-30 DIAGNOSIS — R188 Other ascites: Secondary | ICD-10-CM | POA: Insufficient documentation

## 2020-12-01 DIAGNOSIS — D509 Iron deficiency anemia, unspecified: Secondary | ICD-10-CM | POA: Diagnosis not present

## 2020-12-01 DIAGNOSIS — Z992 Dependence on renal dialysis: Secondary | ICD-10-CM | POA: Diagnosis not present

## 2020-12-01 DIAGNOSIS — D689 Coagulation defect, unspecified: Secondary | ICD-10-CM | POA: Diagnosis not present

## 2020-12-01 DIAGNOSIS — D631 Anemia in chronic kidney disease: Secondary | ICD-10-CM | POA: Diagnosis not present

## 2020-12-01 DIAGNOSIS — N186 End stage renal disease: Secondary | ICD-10-CM | POA: Diagnosis not present

## 2020-12-01 DIAGNOSIS — I129 Hypertensive chronic kidney disease with stage 1 through stage 4 chronic kidney disease, or unspecified chronic kidney disease: Secondary | ICD-10-CM | POA: Diagnosis not present

## 2020-12-01 DIAGNOSIS — N2581 Secondary hyperparathyroidism of renal origin: Secondary | ICD-10-CM | POA: Diagnosis not present

## 2020-12-02 ENCOUNTER — Telehealth: Payer: Self-pay | Admitting: General Surgery

## 2020-12-02 NOTE — Telephone Encounter (Signed)
-----   Message from [place], DO sent at 11/30/2020  5:44 PM EST ----- Ultrasound demonstrates largely normal appearing liver (mildly atrophic).  Liver without nodular contour suggestive of cirrhosis.  No hepatic lesions or duct dilation.  Gallstones and biliary sludge noted in the neck of an otherwise normal-appearing gallbladder without cholecystitis.  Moderate intra-abdominal ascites.  Normal appearing spleen with patent vasculature with normal flow.  Based on these findings and the previous evaluation, higher suspicion for nephrogenic ascites.  If not already scheduled, patient may need repeat large-volume, therapeutic paracentesis.

## 2020-12-02 NOTE — Telephone Encounter (Signed)
Spoke with the patient and expressed to him that the ascites appears to be nephrogenic. The patient verbalized understanding and asked if we would send a copy to his nephrologist.

## 2020-12-03 DIAGNOSIS — Z992 Dependence on renal dialysis: Secondary | ICD-10-CM | POA: Diagnosis not present

## 2020-12-03 DIAGNOSIS — D509 Iron deficiency anemia, unspecified: Secondary | ICD-10-CM | POA: Diagnosis not present

## 2020-12-03 DIAGNOSIS — N2581 Secondary hyperparathyroidism of renal origin: Secondary | ICD-10-CM | POA: Diagnosis not present

## 2020-12-03 DIAGNOSIS — N186 End stage renal disease: Secondary | ICD-10-CM | POA: Diagnosis not present

## 2020-12-03 DIAGNOSIS — D689 Coagulation defect, unspecified: Secondary | ICD-10-CM | POA: Diagnosis not present

## 2020-12-04 DIAGNOSIS — I5020 Unspecified systolic (congestive) heart failure: Secondary | ICD-10-CM | POA: Diagnosis not present

## 2020-12-04 DIAGNOSIS — M109 Gout, unspecified: Secondary | ICD-10-CM | POA: Diagnosis not present

## 2020-12-04 DIAGNOSIS — E785 Hyperlipidemia, unspecified: Secondary | ICD-10-CM | POA: Diagnosis not present

## 2020-12-04 DIAGNOSIS — N186 End stage renal disease: Secondary | ICD-10-CM | POA: Diagnosis not present

## 2020-12-04 DIAGNOSIS — I110 Hypertensive heart disease with heart failure: Secondary | ICD-10-CM | POA: Diagnosis not present

## 2020-12-04 DIAGNOSIS — Z992 Dependence on renal dialysis: Secondary | ICD-10-CM | POA: Diagnosis not present

## 2020-12-04 DIAGNOSIS — J45909 Unspecified asthma, uncomplicated: Secondary | ICD-10-CM | POA: Diagnosis not present

## 2020-12-06 ENCOUNTER — Other Ambulatory Visit (HOSPITAL_COMMUNITY): Payer: Self-pay | Admitting: Nephrology

## 2020-12-06 ENCOUNTER — Other Ambulatory Visit: Payer: Self-pay | Admitting: Nephrology

## 2020-12-06 DIAGNOSIS — N186 End stage renal disease: Secondary | ICD-10-CM | POA: Diagnosis not present

## 2020-12-06 DIAGNOSIS — D689 Coagulation defect, unspecified: Secondary | ICD-10-CM | POA: Diagnosis not present

## 2020-12-06 DIAGNOSIS — R188 Other ascites: Secondary | ICD-10-CM

## 2020-12-06 DIAGNOSIS — N2581 Secondary hyperparathyroidism of renal origin: Secondary | ICD-10-CM | POA: Diagnosis not present

## 2020-12-06 DIAGNOSIS — Z992 Dependence on renal dialysis: Secondary | ICD-10-CM | POA: Diagnosis not present

## 2020-12-06 DIAGNOSIS — D509 Iron deficiency anemia, unspecified: Secondary | ICD-10-CM | POA: Diagnosis not present

## 2020-12-07 ENCOUNTER — Ambulatory Visit (HOSPITAL_COMMUNITY)
Admission: RE | Admit: 2020-12-07 | Discharge: 2020-12-07 | Disposition: A | Discharge status: Home / Self Care | Payer: Medicare Other | Source: Ambulatory Visit | Attending: Nephrology | Admitting: Nephrology

## 2020-12-07 ENCOUNTER — Other Ambulatory Visit: Payer: Self-pay

## 2020-12-07 DIAGNOSIS — R188 Other ascites: Secondary | ICD-10-CM | POA: Insufficient documentation

## 2020-12-07 DIAGNOSIS — I5020 Unspecified systolic (congestive) heart failure: Secondary | ICD-10-CM | POA: Diagnosis not present

## 2020-12-07 HISTORY — PX: IR PARACENTESIS: IMG2679

## 2020-12-07 MED ORDER — LIDOCAINE HCL 1 % IJ SOLN
INTRAMUSCULAR | Status: AC
  Filled 2020-12-07: qty 20

## 2020-12-07 MED ORDER — LIDOCAINE HCL 1 % IJ SOLN
INTRAMUSCULAR | Status: DC | PRN
  Administered 2020-12-07: 10 mL via INTRADERMAL

## 2020-12-07 NOTE — Procedures (Signed)
Ultrasound-guided diagnostic and therapeutic paracentesis performed yielding 4.7 liters of clear colored fluid.  Fluid was sent to lab for analysis. No immediate complications. EBL is none.

## 2020-12-08 DIAGNOSIS — D509 Iron deficiency anemia, unspecified: Secondary | ICD-10-CM | POA: Diagnosis not present

## 2020-12-08 DIAGNOSIS — Z992 Dependence on renal dialysis: Secondary | ICD-10-CM | POA: Diagnosis not present

## 2020-12-08 DIAGNOSIS — N186 End stage renal disease: Secondary | ICD-10-CM | POA: Diagnosis not present

## 2020-12-08 DIAGNOSIS — D689 Coagulation defect, unspecified: Secondary | ICD-10-CM | POA: Diagnosis not present

## 2020-12-08 DIAGNOSIS — N2581 Secondary hyperparathyroidism of renal origin: Secondary | ICD-10-CM | POA: Diagnosis not present

## 2020-12-11 DIAGNOSIS — Z992 Dependence on renal dialysis: Secondary | ICD-10-CM | POA: Diagnosis not present

## 2020-12-11 DIAGNOSIS — N186 End stage renal disease: Secondary | ICD-10-CM | POA: Diagnosis not present

## 2020-12-14 DIAGNOSIS — Z992 Dependence on renal dialysis: Secondary | ICD-10-CM | POA: Diagnosis not present

## 2020-12-14 DIAGNOSIS — D689 Coagulation defect, unspecified: Secondary | ICD-10-CM | POA: Diagnosis not present

## 2020-12-14 DIAGNOSIS — N186 End stage renal disease: Secondary | ICD-10-CM | POA: Diagnosis not present

## 2020-12-14 DIAGNOSIS — N2581 Secondary hyperparathyroidism of renal origin: Secondary | ICD-10-CM | POA: Diagnosis not present

## 2020-12-14 DIAGNOSIS — D509 Iron deficiency anemia, unspecified: Secondary | ICD-10-CM | POA: Diagnosis not present

## 2020-12-15 DIAGNOSIS — D509 Iron deficiency anemia, unspecified: Secondary | ICD-10-CM | POA: Diagnosis not present

## 2020-12-15 DIAGNOSIS — N2581 Secondary hyperparathyroidism of renal origin: Secondary | ICD-10-CM | POA: Diagnosis not present

## 2020-12-15 DIAGNOSIS — Z992 Dependence on renal dialysis: Secondary | ICD-10-CM | POA: Diagnosis not present

## 2020-12-15 DIAGNOSIS — N186 End stage renal disease: Secondary | ICD-10-CM | POA: Diagnosis not present

## 2020-12-15 DIAGNOSIS — D689 Coagulation defect, unspecified: Secondary | ICD-10-CM | POA: Diagnosis not present

## 2020-12-17 DIAGNOSIS — D509 Iron deficiency anemia, unspecified: Secondary | ICD-10-CM | POA: Diagnosis not present

## 2020-12-17 DIAGNOSIS — N186 End stage renal disease: Secondary | ICD-10-CM | POA: Diagnosis not present

## 2020-12-17 DIAGNOSIS — D689 Coagulation defect, unspecified: Secondary | ICD-10-CM | POA: Diagnosis not present

## 2020-12-17 DIAGNOSIS — N2581 Secondary hyperparathyroidism of renal origin: Secondary | ICD-10-CM | POA: Diagnosis not present

## 2020-12-17 DIAGNOSIS — Z992 Dependence on renal dialysis: Secondary | ICD-10-CM | POA: Diagnosis not present

## 2020-12-20 ENCOUNTER — Other Ambulatory Visit: Payer: Self-pay | Admitting: Nephrology

## 2020-12-20 ENCOUNTER — Other Ambulatory Visit (HOSPITAL_COMMUNITY): Payer: Self-pay | Admitting: Nephrology

## 2020-12-20 DIAGNOSIS — D509 Iron deficiency anemia, unspecified: Secondary | ICD-10-CM | POA: Diagnosis not present

## 2020-12-20 DIAGNOSIS — R188 Other ascites: Secondary | ICD-10-CM

## 2020-12-20 DIAGNOSIS — Z992 Dependence on renal dialysis: Secondary | ICD-10-CM | POA: Diagnosis not present

## 2020-12-20 DIAGNOSIS — N186 End stage renal disease: Secondary | ICD-10-CM | POA: Diagnosis not present

## 2020-12-20 DIAGNOSIS — N2581 Secondary hyperparathyroidism of renal origin: Secondary | ICD-10-CM | POA: Diagnosis not present

## 2020-12-20 DIAGNOSIS — D689 Coagulation defect, unspecified: Secondary | ICD-10-CM | POA: Diagnosis not present

## 2020-12-21 ENCOUNTER — Ambulatory Visit (HOSPITAL_COMMUNITY)
Admission: RE | Admit: 2020-12-21 | Discharge: 2020-12-21 | Disposition: A | Discharge status: Home / Self Care | Payer: Medicare Other | Source: Ambulatory Visit | Attending: Nephrology | Admitting: Nephrology

## 2020-12-21 ENCOUNTER — Other Ambulatory Visit: Payer: Self-pay

## 2020-12-21 DIAGNOSIS — R188 Other ascites: Secondary | ICD-10-CM | POA: Diagnosis not present

## 2020-12-21 DIAGNOSIS — N35013 Post-traumatic anterior urethral stricture: Secondary | ICD-10-CM | POA: Diagnosis not present

## 2020-12-21 HISTORY — PX: IR PARACENTESIS: IMG2679

## 2020-12-21 MED ORDER — LIDOCAINE HCL 1 % IJ SOLN
INTRAMUSCULAR | Status: AC
  Filled 2020-12-21: qty 20

## 2020-12-21 MED ORDER — LIDOCAINE HCL 1 % IJ SOLN
INTRAMUSCULAR | Status: DC | PRN
  Administered 2020-12-21: 5 mL via INTRADERMAL

## 2020-12-21 NOTE — Procedures (Signed)
Ultrasound-guided  therapeutic paracentesis performed yielding 5.3 liters of slightly hazy, yellow fluid. No immediate complications. EBL none.

## 2020-12-22 DIAGNOSIS — Z992 Dependence on renal dialysis: Secondary | ICD-10-CM | POA: Diagnosis not present

## 2020-12-22 DIAGNOSIS — N186 End stage renal disease: Secondary | ICD-10-CM | POA: Diagnosis not present

## 2020-12-22 DIAGNOSIS — D509 Iron deficiency anemia, unspecified: Secondary | ICD-10-CM | POA: Diagnosis not present

## 2020-12-22 DIAGNOSIS — D689 Coagulation defect, unspecified: Secondary | ICD-10-CM | POA: Diagnosis not present

## 2020-12-22 DIAGNOSIS — N2581 Secondary hyperparathyroidism of renal origin: Secondary | ICD-10-CM | POA: Diagnosis not present

## 2020-12-23 DIAGNOSIS — N99114 Postprocedural urethral stricture, male, unspecified: Secondary | ICD-10-CM | POA: Diagnosis not present

## 2020-12-23 DIAGNOSIS — Z01818 Encounter for other preprocedural examination: Secondary | ICD-10-CM | POA: Diagnosis not present

## 2020-12-24 DIAGNOSIS — N186 End stage renal disease: Secondary | ICD-10-CM | POA: Diagnosis not present

## 2020-12-24 DIAGNOSIS — D689 Coagulation defect, unspecified: Secondary | ICD-10-CM | POA: Diagnosis not present

## 2020-12-24 DIAGNOSIS — N2581 Secondary hyperparathyroidism of renal origin: Secondary | ICD-10-CM | POA: Diagnosis not present

## 2020-12-24 DIAGNOSIS — Z992 Dependence on renal dialysis: Secondary | ICD-10-CM | POA: Diagnosis not present

## 2020-12-24 DIAGNOSIS — D509 Iron deficiency anemia, unspecified: Secondary | ICD-10-CM | POA: Diagnosis not present

## 2020-12-27 DIAGNOSIS — D689 Coagulation defect, unspecified: Secondary | ICD-10-CM | POA: Diagnosis not present

## 2020-12-27 DIAGNOSIS — Z992 Dependence on renal dialysis: Secondary | ICD-10-CM | POA: Diagnosis not present

## 2020-12-27 DIAGNOSIS — N186 End stage renal disease: Secondary | ICD-10-CM | POA: Diagnosis not present

## 2020-12-27 DIAGNOSIS — N2581 Secondary hyperparathyroidism of renal origin: Secondary | ICD-10-CM | POA: Diagnosis not present

## 2020-12-27 DIAGNOSIS — D509 Iron deficiency anemia, unspecified: Secondary | ICD-10-CM | POA: Diagnosis not present

## 2020-12-29 DIAGNOSIS — D689 Coagulation defect, unspecified: Secondary | ICD-10-CM | POA: Diagnosis not present

## 2020-12-29 DIAGNOSIS — N186 End stage renal disease: Secondary | ICD-10-CM | POA: Diagnosis not present

## 2020-12-29 DIAGNOSIS — Z992 Dependence on renal dialysis: Secondary | ICD-10-CM | POA: Diagnosis not present

## 2020-12-29 DIAGNOSIS — D509 Iron deficiency anemia, unspecified: Secondary | ICD-10-CM | POA: Diagnosis not present

## 2020-12-29 DIAGNOSIS — N2581 Secondary hyperparathyroidism of renal origin: Secondary | ICD-10-CM | POA: Diagnosis not present

## 2020-12-31 DIAGNOSIS — D689 Coagulation defect, unspecified: Secondary | ICD-10-CM | POA: Diagnosis not present

## 2020-12-31 DIAGNOSIS — D509 Iron deficiency anemia, unspecified: Secondary | ICD-10-CM | POA: Diagnosis not present

## 2020-12-31 DIAGNOSIS — N186 End stage renal disease: Secondary | ICD-10-CM | POA: Diagnosis not present

## 2020-12-31 DIAGNOSIS — Z992 Dependence on renal dialysis: Secondary | ICD-10-CM | POA: Diagnosis not present

## 2020-12-31 DIAGNOSIS — N2581 Secondary hyperparathyroidism of renal origin: Secondary | ICD-10-CM | POA: Diagnosis not present

## 2021-01-01 DIAGNOSIS — I129 Hypertensive chronic kidney disease with stage 1 through stage 4 chronic kidney disease, or unspecified chronic kidney disease: Secondary | ICD-10-CM | POA: Diagnosis not present

## 2021-01-01 DIAGNOSIS — Z992 Dependence on renal dialysis: Secondary | ICD-10-CM | POA: Diagnosis not present

## 2021-01-01 DIAGNOSIS — N186 End stage renal disease: Secondary | ICD-10-CM | POA: Diagnosis not present

## 2021-01-03 DIAGNOSIS — N2581 Secondary hyperparathyroidism of renal origin: Secondary | ICD-10-CM | POA: Diagnosis not present

## 2021-01-03 DIAGNOSIS — D631 Anemia in chronic kidney disease: Secondary | ICD-10-CM | POA: Diagnosis not present

## 2021-01-03 DIAGNOSIS — D509 Iron deficiency anemia, unspecified: Secondary | ICD-10-CM | POA: Diagnosis not present

## 2021-01-03 DIAGNOSIS — Z992 Dependence on renal dialysis: Secondary | ICD-10-CM | POA: Diagnosis not present

## 2021-01-03 DIAGNOSIS — D689 Coagulation defect, unspecified: Secondary | ICD-10-CM | POA: Diagnosis not present

## 2021-01-03 DIAGNOSIS — N186 End stage renal disease: Secondary | ICD-10-CM | POA: Diagnosis not present

## 2021-01-05 DIAGNOSIS — N2581 Secondary hyperparathyroidism of renal origin: Secondary | ICD-10-CM | POA: Diagnosis not present

## 2021-01-05 DIAGNOSIS — D631 Anemia in chronic kidney disease: Secondary | ICD-10-CM | POA: Diagnosis not present

## 2021-01-05 DIAGNOSIS — D689 Coagulation defect, unspecified: Secondary | ICD-10-CM | POA: Diagnosis not present

## 2021-01-05 DIAGNOSIS — D509 Iron deficiency anemia, unspecified: Secondary | ICD-10-CM | POA: Diagnosis not present

## 2021-01-05 DIAGNOSIS — Z992 Dependence on renal dialysis: Secondary | ICD-10-CM | POA: Diagnosis not present

## 2021-01-05 DIAGNOSIS — N186 End stage renal disease: Secondary | ICD-10-CM | POA: Diagnosis not present

## 2021-01-06 ENCOUNTER — Encounter (HOSPITAL_COMMUNITY): Payer: Self-pay | Admitting: Cardiology

## 2021-01-06 ENCOUNTER — Other Ambulatory Visit (HOSPITAL_COMMUNITY): Payer: Self-pay | Admitting: Nephrology

## 2021-01-06 ENCOUNTER — Encounter (HOSPITAL_COMMUNITY): Payer: Self-pay

## 2021-01-06 ENCOUNTER — Other Ambulatory Visit: Payer: Self-pay

## 2021-01-06 ENCOUNTER — Ambulatory Visit (HOSPITAL_COMMUNITY)
Admission: RE | Admit: 2021-01-06 | Discharge: 2021-01-06 | Disposition: A | Discharge status: Home / Self Care | Payer: Medicare Other | Source: Ambulatory Visit | Attending: Cardiology | Admitting: Cardiology

## 2021-01-06 VITALS — BP 102/86 | HR 84 | Wt 160.8 lb

## 2021-01-06 DIAGNOSIS — E785 Hyperlipidemia, unspecified: Secondary | ICD-10-CM | POA: Diagnosis not present

## 2021-01-06 DIAGNOSIS — R188 Other ascites: Secondary | ICD-10-CM | POA: Insufficient documentation

## 2021-01-06 DIAGNOSIS — Z9889 Other specified postprocedural states: Secondary | ICD-10-CM | POA: Diagnosis not present

## 2021-01-06 DIAGNOSIS — I5032 Chronic diastolic (congestive) heart failure: Secondary | ICD-10-CM

## 2021-01-06 DIAGNOSIS — I5022 Chronic systolic (congestive) heart failure: Secondary | ICD-10-CM | POA: Diagnosis not present

## 2021-01-06 DIAGNOSIS — R9431 Abnormal electrocardiogram [ECG] [EKG]: Secondary | ICD-10-CM | POA: Insufficient documentation

## 2021-01-06 DIAGNOSIS — Z79899 Other long term (current) drug therapy: Secondary | ICD-10-CM | POA: Insufficient documentation

## 2021-01-06 DIAGNOSIS — I428 Other cardiomyopathies: Secondary | ICD-10-CM | POA: Diagnosis not present

## 2021-01-06 DIAGNOSIS — N186 End stage renal disease: Secondary | ICD-10-CM | POA: Insufficient documentation

## 2021-01-06 DIAGNOSIS — I132 Hypertensive heart and chronic kidney disease with heart failure and with stage 5 chronic kidney disease, or end stage renal disease: Secondary | ICD-10-CM | POA: Diagnosis not present

## 2021-01-06 DIAGNOSIS — Z8673 Personal history of transient ischemic attack (TIA), and cerebral infarction without residual deficits: Secondary | ICD-10-CM | POA: Diagnosis not present

## 2021-01-06 DIAGNOSIS — I052 Rheumatic mitral stenosis with insufficiency: Secondary | ICD-10-CM | POA: Insufficient documentation

## 2021-01-06 DIAGNOSIS — N400 Enlarged prostate without lower urinary tract symptoms: Secondary | ICD-10-CM | POA: Insufficient documentation

## 2021-01-06 DIAGNOSIS — Z952 Presence of prosthetic heart valve: Secondary | ICD-10-CM | POA: Insufficient documentation

## 2021-01-06 DIAGNOSIS — J45909 Unspecified asthma, uncomplicated: Secondary | ICD-10-CM | POA: Insufficient documentation

## 2021-01-06 NOTE — Patient Instructions (Addendum)
No changes today!  Your physician recommends that you schedule a follow-up appointment in: 6 months with Nurse Practitioner/Physician Assistant. You will get a call to schedule this appointment.   Please call office at [phone] option 2 if you have any questions or concerns.   At the [facility], you and your health needs are our priority. As part of our continuing mission to provide you with exceptional heart care, we have created designated Provider Care Teams. These Care Teams include your primary Cardiologist (physician) and Advanced Practice Providers (APPs- Physician Assistants and Nurse Practitioners) who all work together to provide you with the care you need, when you need it.   You may see any of the following providers on your designated Care Team at your next follow up: Dr Glori Bickers Dr Haynes Kerns, NP Lyda Jester, [place] [facility] [place], [place] Audry Riles, PharmD   Please be sure to bring in all your medications bottles to every appointment.

## 2021-01-06 NOTE — Progress Notes (Signed)
Heart Failure  Note  ID:  Vernon Blackburn, Roots [birth year], MRN [id number] Provider location: [facility] Type of Visit: Established patient   PCP:  Vernon Dress, MD  Cardiologist:  Dr. Aundra Blackburn   History of Present Illness: Vernon Blackburn is a 56 y.o. male who has a history of long-standing, poorly-controlled HTN, CKD stage 3-4, chronic systolic CHF and severe mitral regurgitation.  He presents for followup of CHF and HTN.  He has had hypertension for years.  He has had known CKD, followed by Dr. Justin Blackburn.  He developed severe dyspnea in 12/17 and was admitted with acute systolic CHF.  Echo showed EF 20-25% with severe MR.  He was diuresed and discharged.   TEE was done in 5/18 to assess mitral valve.  There was severe MR.  The MV was thickened and did not coapt well.  Suspect there was a component of secondary MR with moderate LV dilation, however the thickened leaflets suggested a component of primary MR as well.   He had right and left heart cath in 6/18.  No significant CAD, elevated filling pressures.    In 6/18, he had mitral valve repair.  Post-op echo in 7/18 showed EF 25-30% with stable MV repair (mild MR, mean gradient 4 mmHg).  Echo 12/18 with EF 30-35%, moderate LV dilation, s/p MVR repair with trivial MR.  Echo in 4/19 showed EF up to 40-45% with stable repaired mitral valve.   He had an echo in 8/20 with EF 35%, mild LV dilation, s/p MV repair with mean gradient 4 mmHg and trivial MR, mildly decreased RV systolic function.   He was admitted in 2/21 to [facility] with worsening renal function and HD was started.  Echo at [facility] in 2/21 showed EF 40-45%, mild LV dilation, moderate LVH, "severe mitral stenosis" with mean gradient 7 mmHg and eccentric mild MR.  Head imagining showed an old CVA.   TEE was done to followup on ?severe mitral stenosis in 3/21.  This showed EF 45%, moderate LVH, global mild hypokinesis, mildly decreased RV systolic function; s/p MV repair,  mild MR with mean gradient 4 mmHg, no significant mitral stenosis.  CPX in 11/21 showed peak VO2 17.7 (51% predicted), RER 1.12, VE/VCO2 slope 29.  Moderate functional limitation due to mixed restrictive/obstructive lung disease.  Echo in 7/22 showed EF 50-55%, s/p MV repair with trivial MR and mean gradient 5 mmHg.   Patient was admitted in 7/22 with abdominal distention and found to have ascites, cause still uncertain (unless simply due to inadequate dialysis prescription).  No evidence for cirrhosis by CT. He had paracentesis. He additionally had TURP for enlarged prostate and has ended up with a suprapubic catheter.    [place] in 10/22 to assess RV function in the setting of ascites of uncertain etiology showed normal right and left heart filling pressures, normal PA pressure, and normal cardiac output.  He has been seen by GI and continues to require paracenteses.  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   He returns today for followup of CHF.  He occasionally gets low BP still at HD, takes midodrine pre-HD and holds Coreg on the day of HD.  Weight is down 6 lbs.  He continues to need paracenteses every 2 wks.  No dyspnea or chest pain with exertion. He is going to have a urethral reconstruction next week.   ECG (personally reviewed): NSR,  QTc 484  Labs (4/18): K 3.7, creatinine 3.05, hgb 10.4 Labs (5/18): K 3.3, creatinine 2.89, BNP 789 Labs (6/18): K 3.4, creatinine 2.4, hgb 10.1 Labs (7/18): K 3.2, creatinine 2.27, BNP 303 Labs (9/18): LDL 78, HDL 56, K 3.8, creatinine 2.16 Labs (10/18): hgb 10.1 Labs (2/19): K 3.3, creatinine 2.75, hgb 12.1 Labs (5/19): K 3.6, creatinine 3.07 Labs (7/20): K 4, creatinine 3.55, hgb 12.8 Labs (2/21): LDL 127 Labs (7/22): hgb 10.4  PMH: 1. Gout 2. HTN: Long-standing, poor control. 3. ESRD.  Likely hypertensive nephropathy.  4. Dilated cardiomyopathy: May be due to long-standing HTN.  - Echo  (1/18): EF 20-25%, severe MR - Echo (2/18): EF 25%, severe MR.  - TEE (5/18): Moderate LV dilation with severe global hypokinesis, EF 25-30%, normal RV size with mildly decreased systolic function, RV-RA gradient 55 mmHg, severe MR with mildly thickened leaflets with inadequate coaptation and ERO 0.52 cm^2 by PISA and systolic flow reversal in the pulmonary vein doppler pattern => secondary MR from dilated LV but thickened leaflets lead to concern for component of primary MR as well.  - RHC/LHC (6/18): No significant CAD.  Mean RA 15, PA 76/31 mean 48, mean PCWP 45, CI 3.02 Fick, 2.64 thermo.  - Echo (7/18): EF 25-30%, mild dilation, diffuse hypokinesis, s/p MV repair with mean gradient 4 mmHg and mild MR, PASP 37 mmHg, normal RV size and systolic function.  - Echo (12/18): EF 30-35%, moderate LV dilation with diffuse hypokinesis, stable MV repair.  - Echo (4/19): EF 40-45%, severe LV dilation, s/p mitral valve repair with mean gradient 4 mmHg, no mitral regurgitation, severe RV dilation with normal systolic function.  - Echo (8/20): EF 35%, mild LV dilation, s/p MV repair with mean gradient 4 mmHg and trivial MR, mildly decreased RV systolic function.  - Echo (2/21, [facility]):  EF 40-45%, mild LV dilation, moderate LVH, "severe mitral stenosis" with mean gradient 7 mmHg and eccentric mild MR. - TEE (3/21): EF 45%, moderate LVH, global mild hypokinesis, mildly decreased RV systolic function; s/p MV repair, mild MR with mean gradient 4 mmHg, no significant mitral stenosis.  - CPX (11/21): peak VO2 17.7 (51% predicted), RER 1.12, VE/VCO2 slope 29.  Moderate functional limitation due to mixed restrictive/obstructive lung disease.  - Echo (7/22): EF 50-55%, s/p MV repair with trivial MR and mean gradient 5 mmHg.  - RHC (10/22): mean RA 1, PA 17/2, mean PCWP 2, CI 3.3 5. Severe MR: S/p MV repair in 6/18. 6. H/o CVA 7. BPH: S/p TURP, now with suprapubic catheter.  8. H/o ascites (7/22) with paracenteses.    SH: Married, lives in [place], nonsmoker, no drugs, occasional ETOH.  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   Family History  Problem Relation Age of Onset   Diabetes Mother    Hypertension Mother    Heart failure Mother    Colon cancer Neg Hx    Esophageal cancer Neg Hx    Rectal cancer Neg Hx    Stomach cancer Neg Hx    ROS: All systems reviewed and negative except as per HPI.   Current Outpatient Medications  Medication Sig Dispense Refill   Albuterol Sulfate (PROAIR RESPICLICK) 086 (90 Base) MCG/ACT AEPB Inhale 2 puffs into the lungs every 6 (six) hours as needed (wheezing/shortness of breath).     atorvastatin (LIPITOR) 20 MG tablet Take 20 mg by mouth daily.     B Complex-C-Zn-Folic Acid (DIALYVITE 578 WITH ZINC) 0.8 MG TABS Take  1 tablet by mouth every evening.     Budeson-Glycopyrrol-Formoterol (BREZTRI AEROSPHERE) 160-9-4.8 MCG/ACT AERO Inhale 2 puffs into the lungs 2 (two) times daily as needed (congestion/respiratory issues.).     calcitRIOL (ROCALTROL) 0.5 MCG capsule Take 0.5 mcg by mouth every Monday, Wednesday, and Friday.     calcium acetate (PHOSLO) 667 MG capsule Take 2,668 mg by mouth 3 (three) times daily with meals.     carvedilol (COREG) 3.125 MG tablet Take 1 tablet (3.125 mg total) by mouth 2 (two) times daily with a meal. 180 tablet 3   Febuxostat 80 MG TABS Take 80 mg by mouth daily.     fexofenadine (ALLEGRA) 180 MG tablet Take 180 mg by mouth daily as needed for allergies.     furosemide (LASIX) 80 MG tablet Take 80 mg by mouth 2 (two) times daily.     lactulose (CHRONULAC) 10 GM/15ML solution Take 10 g by mouth 2 (two) times daily as needed for mild constipation.     lidocaine-prilocaine (EMLA) cream Apply 1 application topically daily as needed (prior to port being access).     polyethylene glycol (MIRALAX / GLYCOLAX) 17 g packet Take 17 g by mouth daily as needed (constipation every other day).     tamsulosin (FLOMAX) 0.4 MG  CAPS capsule Take 0.4 mg by mouth in the morning.     VYVANSE 30 MG capsule Take 30 mg by mouth every morning.     No current facility-administered medications for this encounter.   Exam:   BP 102/86    Pulse 84 Comment: manualy   Wt 72.9 kg (160 lb 12.8 oz)    BMI 23.75 kg/m  General: NAD Neck: No JVD, no thyromegaly or thyroid nodule.  Lungs: Clear to auscultation bilaterally with normal respiratory effort. CV: Nondisplaced PMI.  Heart regular S1/S2, no S3/S4, 1/6 SEM RUSB.  No peripheral edema.  No carotid bruit.  Normal pedal pulses.  Abdomen: Soft, nontender, no hepatosplenomegaly, mild distention.  Skin: Intact without lesions or rashes.  Neurologic: Alert and oriented x 3.  Psych: Normal affect. Extremities: No clubbing or cyanosis.  HEENT: Normal.   Assessment/Plan: 1. HTN: Possible hypertensive cardiomyopathy and hypertensive nephropathy.  BP now running low.  2. ESRD: He was switched from PD to HD, working up for renal transplant at [facility].  This is on hold with ascites and suprapubic catheter.  3. Chronic systolic CHF: Echo in 3/82 with EF 25-30%. Nonischemic cardiomyopathy based on cath in 6/18.  Cause of cardiomyopathy may be long-standing, poorly controlled HTN.  NYHA class II symptoms.  He improved symptomatically s/p MV repair.  Echo in 4/19 with EF 40-45%. Echo at [facility] in 2/21 showed EF 40-45%.   TEE in 3/21 with EF 45%.  Echo in 5/22 with EF up to 50-55%.  NYHA class II. RHC in 10/22 was normal, his ascites cannot be explained by RV failure.  He is not volume overloaded on exam.   - Continue Coreg 3.125 mg bid on non-HD days.  4. Mitral regurgitation: s/p MV repair for severe MR.  Much improved symptomatically s/p surgery. Echo at [facility] in 2/21 suggested "severe" mitral stenosis with mean gradient 7 mmHg and eccentric MR, ?mild.  However, TEE here in 3/21 showed stable MV repair with mild MR and no significant mitral stenosis.  Echo in 5/22 showed stable repaired mitral valve  with no more than mild stenosis and trivial regurgitation.  - Will need abx with dental work given prosthetic material.  5.  Asthma: Occasional wheezing, not exertional.  Abnormal CPX in the past suggesting lung pathology. 6. CVA: Prior CVA by head imaging.   7. Hyperlipidemia: Continue atorvastatin.  8. Ascites: No cirrhosis on liver imaging and RHC did not show RV failure.  However, he has required paracenteses every 2 wks.  It is possible that this is nephrogenic ascites that will improve with more aggressive HD.   Followup in 6 months with APP.   Signed, Loralie Champagne, MD  01/06/2021  [facility] [address] [facility] and [place] [address] [phone] (office) [phone] (fax)

## 2021-01-07 DIAGNOSIS — N186 End stage renal disease: Secondary | ICD-10-CM | POA: Diagnosis not present

## 2021-01-07 DIAGNOSIS — N2581 Secondary hyperparathyroidism of renal origin: Secondary | ICD-10-CM | POA: Diagnosis not present

## 2021-01-07 DIAGNOSIS — D509 Iron deficiency anemia, unspecified: Secondary | ICD-10-CM | POA: Diagnosis not present

## 2021-01-07 DIAGNOSIS — D631 Anemia in chronic kidney disease: Secondary | ICD-10-CM | POA: Diagnosis not present

## 2021-01-07 DIAGNOSIS — Z992 Dependence on renal dialysis: Secondary | ICD-10-CM | POA: Diagnosis not present

## 2021-01-07 DIAGNOSIS — D689 Coagulation defect, unspecified: Secondary | ICD-10-CM | POA: Diagnosis not present

## 2021-01-10 ENCOUNTER — Ambulatory Visit (HOSPITAL_COMMUNITY)
Admission: RE | Admit: 2021-01-10 | Discharge: 2021-01-10 | Disposition: A | Discharge status: Home / Self Care | Payer: Medicare Other | Source: Ambulatory Visit | Attending: Nephrology | Admitting: Nephrology

## 2021-01-10 ENCOUNTER — Other Ambulatory Visit: Payer: Self-pay

## 2021-01-10 DIAGNOSIS — I509 Heart failure, unspecified: Secondary | ICD-10-CM | POA: Diagnosis not present

## 2021-01-10 DIAGNOSIS — N186 End stage renal disease: Secondary | ICD-10-CM | POA: Diagnosis not present

## 2021-01-10 DIAGNOSIS — R188 Other ascites: Secondary | ICD-10-CM | POA: Insufficient documentation

## 2021-01-10 HISTORY — PX: IR PARACENTESIS: IMG2679

## 2021-01-10 MED ORDER — LIDOCAINE HCL 1 % IJ SOLN
INTRAMUSCULAR | Status: AC
  Administered 2021-01-10: 10 mL
  Filled 2021-01-10: qty 20

## 2021-01-10 NOTE — Procedures (Signed)
PROCEDURE SUMMARY:  Successful US guided paracentesis from left lateral abdomen.  Yielded 5.1 liters of yelow fluid.  No immediate complications.  Pt tolerated well.   Specimen was not sent for labs.  EBL < 8mL  Docia Barrier PA-C 01/10/2021 10:06 AM

## 2021-01-11 DIAGNOSIS — D689 Coagulation defect, unspecified: Secondary | ICD-10-CM | POA: Diagnosis not present

## 2021-01-11 DIAGNOSIS — D631 Anemia in chronic kidney disease: Secondary | ICD-10-CM | POA: Diagnosis not present

## 2021-01-11 DIAGNOSIS — Z992 Dependence on renal dialysis: Secondary | ICD-10-CM | POA: Diagnosis not present

## 2021-01-11 DIAGNOSIS — N186 End stage renal disease: Secondary | ICD-10-CM | POA: Diagnosis not present

## 2021-01-11 DIAGNOSIS — D509 Iron deficiency anemia, unspecified: Secondary | ICD-10-CM | POA: Diagnosis not present

## 2021-01-11 DIAGNOSIS — N2581 Secondary hyperparathyroidism of renal origin: Secondary | ICD-10-CM | POA: Diagnosis not present

## 2021-01-12 DIAGNOSIS — N2581 Secondary hyperparathyroidism of renal origin: Secondary | ICD-10-CM | POA: Diagnosis not present

## 2021-01-12 DIAGNOSIS — D689 Coagulation defect, unspecified: Secondary | ICD-10-CM | POA: Diagnosis not present

## 2021-01-12 DIAGNOSIS — D509 Iron deficiency anemia, unspecified: Secondary | ICD-10-CM | POA: Diagnosis not present

## 2021-01-12 DIAGNOSIS — D631 Anemia in chronic kidney disease: Secondary | ICD-10-CM | POA: Diagnosis not present

## 2021-01-12 DIAGNOSIS — Z992 Dependence on renal dialysis: Secondary | ICD-10-CM | POA: Diagnosis not present

## 2021-01-12 DIAGNOSIS — N186 End stage renal disease: Secondary | ICD-10-CM | POA: Diagnosis not present

## 2021-01-13 DIAGNOSIS — N35919 Unspecified urethral stricture, male, unspecified site: Secondary | ICD-10-CM | POA: Diagnosis not present

## 2021-01-13 DIAGNOSIS — N99114 Postprocedural urethral stricture, male, unspecified: Secondary | ICD-10-CM | POA: Diagnosis not present

## 2021-01-13 DIAGNOSIS — I132 Hypertensive heart and chronic kidney disease with heart failure and with stage 5 chronic kidney disease, or end stage renal disease: Secondary | ICD-10-CM | POA: Diagnosis not present

## 2021-01-13 DIAGNOSIS — Z7982 Long term (current) use of aspirin: Secondary | ICD-10-CM | POA: Diagnosis not present

## 2021-01-13 DIAGNOSIS — M1990 Unspecified osteoarthritis, unspecified site: Secondary | ICD-10-CM | POA: Diagnosis not present

## 2021-01-13 DIAGNOSIS — M109 Gout, unspecified: Secondary | ICD-10-CM | POA: Diagnosis not present

## 2021-01-13 DIAGNOSIS — E875 Hyperkalemia: Secondary | ICD-10-CM | POA: Diagnosis not present

## 2021-01-13 DIAGNOSIS — N185 Chronic kidney disease, stage 5: Secondary | ICD-10-CM | POA: Diagnosis not present

## 2021-01-13 DIAGNOSIS — N99112 Postprocedural membranous urethral stricture: Secondary | ICD-10-CM | POA: Diagnosis not present

## 2021-01-13 DIAGNOSIS — G4730 Sleep apnea, unspecified: Secondary | ICD-10-CM | POA: Diagnosis not present

## 2021-01-13 DIAGNOSIS — Z992 Dependence on renal dialysis: Secondary | ICD-10-CM | POA: Diagnosis not present

## 2021-01-13 DIAGNOSIS — Z79899 Other long term (current) drug therapy: Secondary | ICD-10-CM | POA: Diagnosis not present

## 2021-01-13 DIAGNOSIS — I509 Heart failure, unspecified: Secondary | ICD-10-CM | POA: Diagnosis not present

## 2021-01-13 DIAGNOSIS — Z87442 Personal history of urinary calculi: Secondary | ICD-10-CM | POA: Diagnosis not present

## 2021-01-13 HISTORY — PX: URETHROPLASTY RECONSTRUCTION / REPAIR PROSTATIC / MEMBRANOUS URETHRA: SUR1423

## 2021-01-14 DIAGNOSIS — N99114 Postprocedural urethral stricture, male, unspecified: Secondary | ICD-10-CM | POA: Diagnosis not present

## 2021-01-14 DIAGNOSIS — N185 Chronic kidney disease, stage 5: Secondary | ICD-10-CM | POA: Diagnosis not present

## 2021-01-14 DIAGNOSIS — D631 Anemia in chronic kidney disease: Secondary | ICD-10-CM | POA: Diagnosis not present

## 2021-01-14 DIAGNOSIS — M1990 Unspecified osteoarthritis, unspecified site: Secondary | ICD-10-CM | POA: Diagnosis not present

## 2021-01-14 DIAGNOSIS — E211 Secondary hyperparathyroidism, not elsewhere classified: Secondary | ICD-10-CM | POA: Diagnosis not present

## 2021-01-14 DIAGNOSIS — Z992 Dependence on renal dialysis: Secondary | ICD-10-CM | POA: Diagnosis not present

## 2021-01-14 DIAGNOSIS — M109 Gout, unspecified: Secondary | ICD-10-CM | POA: Diagnosis not present

## 2021-01-14 DIAGNOSIS — Z79899 Other long term (current) drug therapy: Secondary | ICD-10-CM | POA: Diagnosis not present

## 2021-01-14 DIAGNOSIS — Z7982 Long term (current) use of aspirin: Secondary | ICD-10-CM | POA: Diagnosis not present

## 2021-01-14 DIAGNOSIS — I132 Hypertensive heart and chronic kidney disease with heart failure and with stage 5 chronic kidney disease, or end stage renal disease: Secondary | ICD-10-CM | POA: Diagnosis not present

## 2021-01-14 DIAGNOSIS — E875 Hyperkalemia: Secondary | ICD-10-CM | POA: Diagnosis not present

## 2021-01-14 DIAGNOSIS — G4730 Sleep apnea, unspecified: Secondary | ICD-10-CM | POA: Diagnosis not present

## 2021-01-14 DIAGNOSIS — Z87442 Personal history of urinary calculi: Secondary | ICD-10-CM | POA: Diagnosis not present

## 2021-01-14 DIAGNOSIS — N186 End stage renal disease: Secondary | ICD-10-CM | POA: Diagnosis not present

## 2021-01-14 DIAGNOSIS — I509 Heart failure, unspecified: Secondary | ICD-10-CM | POA: Diagnosis not present

## 2021-01-15 DIAGNOSIS — N185 Chronic kidney disease, stage 5: Secondary | ICD-10-CM | POA: Diagnosis not present

## 2021-01-15 DIAGNOSIS — G4730 Sleep apnea, unspecified: Secondary | ICD-10-CM | POA: Diagnosis not present

## 2021-01-15 DIAGNOSIS — E875 Hyperkalemia: Secondary | ICD-10-CM | POA: Diagnosis not present

## 2021-01-15 DIAGNOSIS — I509 Heart failure, unspecified: Secondary | ICD-10-CM | POA: Diagnosis not present

## 2021-01-15 DIAGNOSIS — Z7982 Long term (current) use of aspirin: Secondary | ICD-10-CM | POA: Diagnosis not present

## 2021-01-15 DIAGNOSIS — M109 Gout, unspecified: Secondary | ICD-10-CM | POA: Diagnosis not present

## 2021-01-15 DIAGNOSIS — Z992 Dependence on renal dialysis: Secondary | ICD-10-CM | POA: Diagnosis not present

## 2021-01-15 DIAGNOSIS — Z87442 Personal history of urinary calculi: Secondary | ICD-10-CM | POA: Diagnosis not present

## 2021-01-15 DIAGNOSIS — I132 Hypertensive heart and chronic kidney disease with heart failure and with stage 5 chronic kidney disease, or end stage renal disease: Secondary | ICD-10-CM | POA: Diagnosis not present

## 2021-01-15 DIAGNOSIS — Z79899 Other long term (current) drug therapy: Secondary | ICD-10-CM | POA: Diagnosis not present

## 2021-01-15 DIAGNOSIS — M1990 Unspecified osteoarthritis, unspecified site: Secondary | ICD-10-CM | POA: Diagnosis not present

## 2021-01-15 DIAGNOSIS — N99114 Postprocedural urethral stricture, male, unspecified: Secondary | ICD-10-CM | POA: Diagnosis not present

## 2021-01-17 DIAGNOSIS — N186 End stage renal disease: Secondary | ICD-10-CM | POA: Diagnosis not present

## 2021-01-17 DIAGNOSIS — D509 Iron deficiency anemia, unspecified: Secondary | ICD-10-CM | POA: Diagnosis not present

## 2021-01-17 DIAGNOSIS — N2581 Secondary hyperparathyroidism of renal origin: Secondary | ICD-10-CM | POA: Diagnosis not present

## 2021-01-17 DIAGNOSIS — D689 Coagulation defect, unspecified: Secondary | ICD-10-CM | POA: Diagnosis not present

## 2021-01-17 DIAGNOSIS — D631 Anemia in chronic kidney disease: Secondary | ICD-10-CM | POA: Diagnosis not present

## 2021-01-17 DIAGNOSIS — Z992 Dependence on renal dialysis: Secondary | ICD-10-CM | POA: Diagnosis not present

## 2021-01-21 DIAGNOSIS — D631 Anemia in chronic kidney disease: Secondary | ICD-10-CM | POA: Diagnosis not present

## 2021-01-21 DIAGNOSIS — D509 Iron deficiency anemia, unspecified: Secondary | ICD-10-CM | POA: Diagnosis not present

## 2021-01-21 DIAGNOSIS — N186 End stage renal disease: Secondary | ICD-10-CM | POA: Diagnosis not present

## 2021-01-21 DIAGNOSIS — N2581 Secondary hyperparathyroidism of renal origin: Secondary | ICD-10-CM | POA: Diagnosis not present

## 2021-01-21 DIAGNOSIS — D689 Coagulation defect, unspecified: Secondary | ICD-10-CM | POA: Diagnosis not present

## 2021-01-21 DIAGNOSIS — Z992 Dependence on renal dialysis: Secondary | ICD-10-CM | POA: Diagnosis not present

## 2021-01-23 ENCOUNTER — Other Ambulatory Visit (HOSPITAL_COMMUNITY): Payer: Self-pay | Admitting: Cardiology

## 2021-01-24 DIAGNOSIS — Z992 Dependence on renal dialysis: Secondary | ICD-10-CM | POA: Diagnosis not present

## 2021-01-24 DIAGNOSIS — N2581 Secondary hyperparathyroidism of renal origin: Secondary | ICD-10-CM | POA: Diagnosis not present

## 2021-01-24 DIAGNOSIS — D689 Coagulation defect, unspecified: Secondary | ICD-10-CM | POA: Diagnosis not present

## 2021-01-24 DIAGNOSIS — D509 Iron deficiency anemia, unspecified: Secondary | ICD-10-CM | POA: Diagnosis not present

## 2021-01-24 DIAGNOSIS — N186 End stage renal disease: Secondary | ICD-10-CM | POA: Diagnosis not present

## 2021-01-24 DIAGNOSIS — D631 Anemia in chronic kidney disease: Secondary | ICD-10-CM | POA: Diagnosis not present

## 2021-01-26 DIAGNOSIS — N186 End stage renal disease: Secondary | ICD-10-CM | POA: Diagnosis not present

## 2021-01-26 DIAGNOSIS — D509 Iron deficiency anemia, unspecified: Secondary | ICD-10-CM | POA: Diagnosis not present

## 2021-01-26 DIAGNOSIS — N2581 Secondary hyperparathyroidism of renal origin: Secondary | ICD-10-CM | POA: Diagnosis not present

## 2021-01-26 DIAGNOSIS — Z992 Dependence on renal dialysis: Secondary | ICD-10-CM | POA: Diagnosis not present

## 2021-01-26 DIAGNOSIS — D631 Anemia in chronic kidney disease: Secondary | ICD-10-CM | POA: Diagnosis not present

## 2021-01-26 DIAGNOSIS — D689 Coagulation defect, unspecified: Secondary | ICD-10-CM | POA: Diagnosis not present

## 2021-01-27 ENCOUNTER — Other Ambulatory Visit: Payer: Self-pay

## 2021-01-27 ENCOUNTER — Ambulatory Visit (HOSPITAL_COMMUNITY)
Admission: RE | Admit: 2021-01-27 | Discharge: 2021-01-27 | Disposition: A | Discharge status: Home / Self Care | Payer: Medicare Other | Source: Ambulatory Visit | Attending: Nephrology | Admitting: Nephrology

## 2021-01-27 DIAGNOSIS — R188 Other ascites: Secondary | ICD-10-CM | POA: Diagnosis not present

## 2021-01-27 DIAGNOSIS — N186 End stage renal disease: Secondary | ICD-10-CM | POA: Diagnosis not present

## 2021-01-27 DIAGNOSIS — I509 Heart failure, unspecified: Secondary | ICD-10-CM | POA: Diagnosis not present

## 2021-01-27 HISTORY — PX: IR PARACENTESIS: IMG2679

## 2021-01-27 MED ORDER — LIDOCAINE HCL (PF) 1 % IJ SOLN
INTRAMUSCULAR | Status: DC | PRN
  Administered 2021-01-27: 10 mL

## 2021-01-27 MED ORDER — ALBUMIN HUMAN 25 % IV SOLN: 25.0000 g | Freq: Once | INTRAVENOUS | Status: DC

## 2021-01-27 MED ORDER — LIDOCAINE HCL 1 % IJ SOLN
INTRAMUSCULAR | Status: AC
  Filled 2021-01-27: qty 20

## 2021-01-27 NOTE — Procedures (Signed)
PROCEDURE SUMMARY:  Successful US guided therapeutic paracentesis from LLQ.  Yielded 4.2 L of clear, yellow fluid.  No immediate complications.  Pt tolerated well.   Specimen not sent for labs.  EBL < 1 mL  Tyson Alias, AGNP 01/27/2021 2:05 PM

## 2021-01-28 DIAGNOSIS — D509 Iron deficiency anemia, unspecified: Secondary | ICD-10-CM | POA: Diagnosis not present

## 2021-01-28 DIAGNOSIS — D631 Anemia in chronic kidney disease: Secondary | ICD-10-CM | POA: Diagnosis not present

## 2021-01-28 DIAGNOSIS — Z992 Dependence on renal dialysis: Secondary | ICD-10-CM | POA: Diagnosis not present

## 2021-01-28 DIAGNOSIS — D689 Coagulation defect, unspecified: Secondary | ICD-10-CM | POA: Diagnosis not present

## 2021-01-28 DIAGNOSIS — N186 End stage renal disease: Secondary | ICD-10-CM | POA: Diagnosis not present

## 2021-01-28 DIAGNOSIS — N2581 Secondary hyperparathyroidism of renal origin: Secondary | ICD-10-CM | POA: Diagnosis not present

## 2021-01-31 DIAGNOSIS — D631 Anemia in chronic kidney disease: Secondary | ICD-10-CM | POA: Diagnosis not present

## 2021-01-31 DIAGNOSIS — D689 Coagulation defect, unspecified: Secondary | ICD-10-CM | POA: Diagnosis not present

## 2021-01-31 DIAGNOSIS — N2581 Secondary hyperparathyroidism of renal origin: Secondary | ICD-10-CM | POA: Diagnosis not present

## 2021-01-31 DIAGNOSIS — N186 End stage renal disease: Secondary | ICD-10-CM | POA: Diagnosis not present

## 2021-01-31 DIAGNOSIS — D509 Iron deficiency anemia, unspecified: Secondary | ICD-10-CM | POA: Diagnosis not present

## 2021-01-31 DIAGNOSIS — Z992 Dependence on renal dialysis: Secondary | ICD-10-CM | POA: Diagnosis not present

## 2021-02-01 DIAGNOSIS — Z992 Dependence on renal dialysis: Secondary | ICD-10-CM | POA: Diagnosis not present

## 2021-02-01 DIAGNOSIS — I129 Hypertensive chronic kidney disease with stage 1 through stage 4 chronic kidney disease, or unspecified chronic kidney disease: Secondary | ICD-10-CM | POA: Diagnosis not present

## 2021-02-01 DIAGNOSIS — N186 End stage renal disease: Secondary | ICD-10-CM | POA: Diagnosis not present

## 2021-02-02 DIAGNOSIS — N99114 Postprocedural urethral stricture, male, unspecified: Secondary | ICD-10-CM | POA: Diagnosis not present

## 2021-02-02 DIAGNOSIS — I110 Hypertensive heart disease with heart failure: Secondary | ICD-10-CM | POA: Diagnosis not present

## 2021-02-02 DIAGNOSIS — I5022 Chronic systolic (congestive) heart failure: Secondary | ICD-10-CM | POA: Diagnosis not present

## 2021-02-03 DIAGNOSIS — D509 Iron deficiency anemia, unspecified: Secondary | ICD-10-CM | POA: Diagnosis not present

## 2021-02-03 DIAGNOSIS — N2581 Secondary hyperparathyroidism of renal origin: Secondary | ICD-10-CM | POA: Diagnosis not present

## 2021-02-03 DIAGNOSIS — Z992 Dependence on renal dialysis: Secondary | ICD-10-CM | POA: Diagnosis not present

## 2021-02-03 DIAGNOSIS — N186 End stage renal disease: Secondary | ICD-10-CM | POA: Diagnosis not present

## 2021-02-03 DIAGNOSIS — D631 Anemia in chronic kidney disease: Secondary | ICD-10-CM | POA: Diagnosis not present

## 2021-02-03 DIAGNOSIS — D689 Coagulation defect, unspecified: Secondary | ICD-10-CM | POA: Diagnosis not present

## 2021-02-04 DIAGNOSIS — N2581 Secondary hyperparathyroidism of renal origin: Secondary | ICD-10-CM | POA: Diagnosis not present

## 2021-02-04 DIAGNOSIS — Z992 Dependence on renal dialysis: Secondary | ICD-10-CM | POA: Diagnosis not present

## 2021-02-04 DIAGNOSIS — D509 Iron deficiency anemia, unspecified: Secondary | ICD-10-CM | POA: Diagnosis not present

## 2021-02-04 DIAGNOSIS — D631 Anemia in chronic kidney disease: Secondary | ICD-10-CM | POA: Diagnosis not present

## 2021-02-04 DIAGNOSIS — N186 End stage renal disease: Secondary | ICD-10-CM | POA: Diagnosis not present

## 2021-02-04 DIAGNOSIS — D689 Coagulation defect, unspecified: Secondary | ICD-10-CM | POA: Diagnosis not present

## 2021-02-07 DIAGNOSIS — N186 End stage renal disease: Secondary | ICD-10-CM | POA: Diagnosis not present

## 2021-02-07 DIAGNOSIS — N2581 Secondary hyperparathyroidism of renal origin: Secondary | ICD-10-CM | POA: Diagnosis not present

## 2021-02-07 DIAGNOSIS — D631 Anemia in chronic kidney disease: Secondary | ICD-10-CM | POA: Diagnosis not present

## 2021-02-07 DIAGNOSIS — D689 Coagulation defect, unspecified: Secondary | ICD-10-CM | POA: Diagnosis not present

## 2021-02-07 DIAGNOSIS — Z992 Dependence on renal dialysis: Secondary | ICD-10-CM | POA: Diagnosis not present

## 2021-02-07 DIAGNOSIS — D509 Iron deficiency anemia, unspecified: Secondary | ICD-10-CM | POA: Diagnosis not present

## 2021-02-09 DIAGNOSIS — N2581 Secondary hyperparathyroidism of renal origin: Secondary | ICD-10-CM | POA: Diagnosis not present

## 2021-02-09 DIAGNOSIS — D689 Coagulation defect, unspecified: Secondary | ICD-10-CM | POA: Diagnosis not present

## 2021-02-09 DIAGNOSIS — D631 Anemia in chronic kidney disease: Secondary | ICD-10-CM | POA: Diagnosis not present

## 2021-02-09 DIAGNOSIS — N186 End stage renal disease: Secondary | ICD-10-CM | POA: Diagnosis not present

## 2021-02-09 DIAGNOSIS — Z992 Dependence on renal dialysis: Secondary | ICD-10-CM | POA: Diagnosis not present

## 2021-02-09 DIAGNOSIS — D509 Iron deficiency anemia, unspecified: Secondary | ICD-10-CM | POA: Diagnosis not present

## 2021-02-10 DIAGNOSIS — N99114 Postprocedural urethral stricture, male, unspecified: Secondary | ICD-10-CM | POA: Diagnosis not present

## 2021-02-10 DIAGNOSIS — I5022 Chronic systolic (congestive) heart failure: Secondary | ICD-10-CM | POA: Diagnosis not present

## 2021-02-10 DIAGNOSIS — I110 Hypertensive heart disease with heart failure: Secondary | ICD-10-CM | POA: Diagnosis not present

## 2021-02-11 ENCOUNTER — Ambulatory Visit (HOSPITAL_COMMUNITY)
Admission: RE | Admit: 2021-02-11 | Discharge: 2021-02-11 | Disposition: A | Discharge status: Home / Self Care | Payer: Medicare Other | Source: Ambulatory Visit | Attending: Nephrology | Admitting: Nephrology

## 2021-02-11 ENCOUNTER — Other Ambulatory Visit: Payer: Self-pay

## 2021-02-11 DIAGNOSIS — R188 Other ascites: Secondary | ICD-10-CM | POA: Diagnosis not present

## 2021-02-11 HISTORY — PX: IR PARACENTESIS: IMG2679

## 2021-02-11 MED ORDER — LIDOCAINE HCL (PF) 1 % IJ SOLN
INTRAMUSCULAR | Status: DC | PRN
  Administered 2021-02-11: 10 mL

## 2021-02-11 NOTE — Procedures (Signed)
Ultrasound-guided therapeutic paracentesis performed yielding 3 liters of yellow tinged colored fluid.  No immediate complications. EBL is none.

## 2021-02-14 DIAGNOSIS — D631 Anemia in chronic kidney disease: Secondary | ICD-10-CM | POA: Diagnosis not present

## 2021-02-14 DIAGNOSIS — D509 Iron deficiency anemia, unspecified: Secondary | ICD-10-CM | POA: Diagnosis not present

## 2021-02-14 DIAGNOSIS — N2581 Secondary hyperparathyroidism of renal origin: Secondary | ICD-10-CM | POA: Diagnosis not present

## 2021-02-14 DIAGNOSIS — Z992 Dependence on renal dialysis: Secondary | ICD-10-CM | POA: Diagnosis not present

## 2021-02-14 DIAGNOSIS — N186 End stage renal disease: Secondary | ICD-10-CM | POA: Diagnosis not present

## 2021-02-14 DIAGNOSIS — D689 Coagulation defect, unspecified: Secondary | ICD-10-CM | POA: Diagnosis not present

## 2021-02-16 DIAGNOSIS — N2581 Secondary hyperparathyroidism of renal origin: Secondary | ICD-10-CM | POA: Diagnosis not present

## 2021-02-16 DIAGNOSIS — D689 Coagulation defect, unspecified: Secondary | ICD-10-CM | POA: Diagnosis not present

## 2021-02-16 DIAGNOSIS — N186 End stage renal disease: Secondary | ICD-10-CM | POA: Diagnosis not present

## 2021-02-16 DIAGNOSIS — Z992 Dependence on renal dialysis: Secondary | ICD-10-CM | POA: Diagnosis not present

## 2021-02-16 DIAGNOSIS — D509 Iron deficiency anemia, unspecified: Secondary | ICD-10-CM | POA: Diagnosis not present

## 2021-02-16 DIAGNOSIS — D631 Anemia in chronic kidney disease: Secondary | ICD-10-CM | POA: Diagnosis not present

## 2021-02-18 DIAGNOSIS — Z992 Dependence on renal dialysis: Secondary | ICD-10-CM | POA: Diagnosis not present

## 2021-02-18 DIAGNOSIS — D631 Anemia in chronic kidney disease: Secondary | ICD-10-CM | POA: Diagnosis not present

## 2021-02-18 DIAGNOSIS — N186 End stage renal disease: Secondary | ICD-10-CM | POA: Diagnosis not present

## 2021-02-18 DIAGNOSIS — N2581 Secondary hyperparathyroidism of renal origin: Secondary | ICD-10-CM | POA: Diagnosis not present

## 2021-02-18 DIAGNOSIS — D689 Coagulation defect, unspecified: Secondary | ICD-10-CM | POA: Diagnosis not present

## 2021-02-18 DIAGNOSIS — D509 Iron deficiency anemia, unspecified: Secondary | ICD-10-CM | POA: Diagnosis not present

## 2021-02-21 DIAGNOSIS — Z992 Dependence on renal dialysis: Secondary | ICD-10-CM | POA: Diagnosis not present

## 2021-02-21 DIAGNOSIS — N2581 Secondary hyperparathyroidism of renal origin: Secondary | ICD-10-CM | POA: Diagnosis not present

## 2021-02-21 DIAGNOSIS — D689 Coagulation defect, unspecified: Secondary | ICD-10-CM | POA: Diagnosis not present

## 2021-02-21 DIAGNOSIS — N186 End stage renal disease: Secondary | ICD-10-CM | POA: Diagnosis not present

## 2021-02-21 DIAGNOSIS — D509 Iron deficiency anemia, unspecified: Secondary | ICD-10-CM | POA: Diagnosis not present

## 2021-02-21 DIAGNOSIS — D631 Anemia in chronic kidney disease: Secondary | ICD-10-CM | POA: Diagnosis not present

## 2021-02-22 ENCOUNTER — Other Ambulatory Visit: Payer: Self-pay

## 2021-02-22 ENCOUNTER — Ambulatory Visit (INDEPENDENT_AMBULATORY_CARE_PROVIDER_SITE_OTHER): Payer: Medicare Other | Admitting: Gastroenterology

## 2021-02-22 ENCOUNTER — Encounter: Payer: Self-pay | Admitting: Gastroenterology

## 2021-02-22 VITALS — BP 100/60 | HR 80 | Ht 69.0 in | Wt 153.5 lb

## 2021-02-22 DIAGNOSIS — I10 Essential (primary) hypertension: Secondary | ICD-10-CM | POA: Diagnosis not present

## 2021-02-22 DIAGNOSIS — Z9889 Other specified postprocedural states: Secondary | ICD-10-CM | POA: Diagnosis not present

## 2021-02-22 DIAGNOSIS — R188 Other ascites: Secondary | ICD-10-CM

## 2021-02-22 DIAGNOSIS — Z1211 Encounter for screening for malignant neoplasm of colon: Secondary | ICD-10-CM

## 2021-02-22 DIAGNOSIS — I420 Dilated cardiomyopathy: Secondary | ICD-10-CM

## 2021-02-22 DIAGNOSIS — Z992 Dependence on renal dialysis: Secondary | ICD-10-CM

## 2021-02-22 DIAGNOSIS — I5022 Chronic systolic (congestive) heart failure: Secondary | ICD-10-CM

## 2021-02-22 DIAGNOSIS — N186 End stage renal disease: Secondary | ICD-10-CM

## 2021-02-22 MED ORDER — CLENPIQ 10-3.5-12 MG-GM -GM/160ML PO SOLN: 1.0000 | Freq: Once | ORAL | 0 refills | Status: AC

## 2021-02-22 NOTE — Progress Notes (Signed)
Chief Complaint:    Ascites  GI History: 56 y.o. male with a history of cardiomyopathy with CHF EF 50-55 %, ESRD on HD MWF (was previously peritoneal dialysis x8 months until 03/2020), CVA, AV fistula, gout, asthma, anemia of chronic disease, OSA, HTN, mitral valve repair, BPH  s/p TURP then ureteral obstruction with suprapubic catheter placement 07/2020, gastric bypass, initially seen in GI clinic on 11/23/2020 for evaluation of new onset ascites.  Has been undergoing serial large-volume paracentesis every 2 weeks since 07/2020.  Initial diagnostic/therapeutic paracentesis on 07/13/2020 (6.7 L removed, negative for SBP. Low SAAG, high protein (3.7)).  Otherwise preserved hepatic synthetic function by labs (low albumin and elevated creatinine attributed to ESRD, otherwise normal INR, PLT, T. bili). - Negative/normal AMA, ANA, ASMA, A1 AT, ceruloplasmin, anti-LKM Ab, tTG, viral hepatitis panel, immunoglobulin panel - 11/2020: RUQ [place] with Dopplers: Monitor ascites, mildly atrophic but otherwise normal-appearing liver.  No nodularity.  Widely patent vasculature with appropriate flow  Currently prescribed lactulose as needed for constipation (no history of encephalopathy), furosemide 80 mg BID (still produces a small amount of urine).    Right heart cardiac catheterization on 10/19/2020 by [name].  Low filling pressures, normal PA pressure, normal cardiac output.  "Ascites does not appear to be due to right heart failure.  Suspect primary hepatic problem".   Last TTE was 07/2020: EF 50-55%.  Mitral valve repaired/replaced.  Endoscopic History: - Colonoscopy (12/2017): Moderate stool with fair visualization.  Internal hemorrhoids.  Normal TI.  Recommend repeat in 1-2 years due to suboptimal bowel prep   HPI:     Patient is a 56 y.o. male presenting to the Gastroenterology Clinic for follow-up.  Initially seen by me on 11/23/2020 for evaluation of new onset ascites.  Elevated suspicion for  nephrogenic ascites.  Recommended colonoscopy for routine CRC screening.  Last seen by [name] in the heart failure clinic on 01/06/2021.  Urethral reconstruction performed at [facility] in January and catheters removed. Back on Kidney transplant list at [facility].   Last paracentesis was 02/11/2021 with 3 L removed.  Continues to require paracentesis every 2 weeks with symptomatic improvement. Ranges 3-7 L removed. Will have some SOB, reduced appetite/PO intake when fluid builds up, which then resolves after paracentesis.  Review of systems:     No chest pain, no SOB, no fevers, no urinary sx   Past Medical History:  Diagnosis Date   ADHD    Anemia, chronic renal failure    Asthma    followed by pcp   Benign localized prostatic hyperplasia with lower urinary tract symptoms (LUTS)    Bladder stones    BPH (benign prostatic hyperplasia)    Chronic gout    05-06-2020  per pt last episode 6 months ago,  (involves right knee and right great toe)   Chronic systolic HF (heart failure) ([place])    followed by cardiology, dr Aundra Dubin @ CHF clinic,   last TEE 03-06-2019 in epic ef 45%   Dyspnea    ESRD on hemodialysis [facility])    primary nephrologist--- dr Moshe Cipro--- HD on M/W/ F  at [place] at [facility] in [place] murmur    History of bladder stone    History of cellulitis 01/27/2020   ED visit ,  abdominal wall   History of CVA (cerebrovascular accident) without residual deficits    remote infarct per imaging MRI brain in care every where 02-07-2019   History of kidney stones  History of obstructive sleep apnea    05-06-2020  per pt test positive for osa prior to gastric bypass in 2014 used cpap,  pt stated was retested after alot of weight loss , test was negative    Hypertension    followed by cardiology   NICM (nonischemic cardiomyopathy) ([place])    followed by cardiology---- 01/ 2018  ef 20-35%, severe MV stenosis;  last TEE echo in epic 03-06-2019  ef 45%   Peripheral  edema    Bilateral lower extremity    S/P minimally invasive mitral valve repair followed by cardiology   06-29-2016  for severe stenosis ;  30 mm [name] via right mini thoracotomy approach;  last TEE echo in epic 03-06-2019 ef 45%, mild LVH, post op mild MR with mean gradient 22mmHg and no significant stenosis   Seasonal and perennial allergic rhinitis    Sleep apnea     Patient's surgical history, family medical history, social history, medications and allergies were all reviewed in Epic    Current Outpatient Medications  Medication Sig Dispense Refill   Albuterol Sulfate (PROAIR RESPICLICK) 956 (90 Base) MCG/ACT AEPB Inhale 2 puffs into the lungs every 6 (six) hours as needed (wheezing/shortness of breath).     atorvastatin (LIPITOR) 20 MG tablet Take 20 mg by mouth daily.     B Complex-C-Zn-Folic Acid (DIALYVITE 387 WITH ZINC) 0.8 MG TABS Take 1 tablet by mouth every evening.     Budeson-Glycopyrrol-Formoterol (BREZTRI AEROSPHERE) 160-9-4.8 MCG/ACT AERO Inhale 2 puffs into the lungs 2 (two) times daily as needed (congestion/respiratory issues.).     calcium acetate (PHOSLO) 667 MG capsule Take 2,668 mg by mouth 3 (three) times daily with meals.     carvedilol (COREG) 3.125 MG tablet Take 1 tablet (3.125 mg total) by mouth 2 (two) times daily with a meal. 180 tablet 3   Febuxostat 80 MG TABS Take 80 mg by mouth daily.     fexofenadine (ALLEGRA) 180 MG tablet Take 180 mg by mouth daily as needed for allergies.     furosemide (LASIX) 80 MG tablet Take 80 mg by mouth 2 (two) times daily.     lactulose (CHRONULAC) 10 GM/15ML solution Take 10 g by mouth 2 (two) times daily as needed for mild constipation.     lidocaine-prilocaine (EMLA) cream Apply 1 application topically daily as needed (prior to port being access).     polyethylene glycol (MIRALAX / GLYCOLAX) 17 g packet Take 17 g by mouth daily as needed (constipation every other day).     tamsulosin (FLOMAX) 0.4 MG  CAPS capsule Take 0.4 mg by mouth in the morning.     VYVANSE 30 MG capsule Take 30 mg by mouth every morning.     calcitRIOL (ROCALTROL) 0.5 MCG capsule Take 0.5 mcg by mouth every Monday, Wednesday, and Friday. (Patient not taking: Reported on 02/22/2021)     No current facility-administered medications for this visit.    Physical Exam:     Ht 5\' 9"  (1.753 m)    Wt 153 lb 8 oz (69.6 kg)    BMI 22.67 kg/m   GENERAL:  Pleasant male in NAD PSYCH: : Cooperative, normal affect NEURO: Alert and oriented x 3, no focal neurologic deficits   IMPRESSION and PLAN:    1) Colon cancer screening - Due for routine colon cancer screening - Schedule colonoscopy.  To be completed at [facility]  - Extended 2-day bowel preparation with Dulcolax 10 mg BID  x2 days, Miralax prep, then Clenpiq, along with clear diet x2 days  2) Ascites 3) ESRD on HD - Etiology: Nephrogenic ascites - Responsive to serial large-volume paracentesis.  Continue paracentesis Q2 weeks as currently doing - Currently prescribed Lasix 80 mg bid by Nephrology along with HD - Collect cell count, albumin, protein, Gram stain, and culture along with serum CMP with next paracentesis -Continue HD per Nephrology - Has follow-up with [facility]  4) Ischemic cardiomyopathy 5) CHF 6) Mitral valve repair 7) HTN - Follows closely in the Heart Failure clinic with [name] - Colonoscopy to be scheduled at [facility] due to elevated periprocedural risks from underlying comorbidities  RTC in 6 months or sooner as needed  The indications, risks, and benefits of colonoscopy were explained to the patient in detail. Risks include but are not limited to bleeding, perforation, adverse reaction to medications, and cardiopulmonary compromise. Sequelae include but are not limited to the possibility of surgery, hospitalization, and mortality. The patient verbalized understanding and wished to proceed. All questions answered,  referred to the scheduler and bowel prep ordered. Further recommendations pending results of the exam.            Dominic Pea Briceyda Abdullah ,DO, FACG 02/22/2021, 9:15 AM

## 2021-02-22 NOTE — Patient Instructions (Addendum)
If you are age 55 or older, your body mass index should be between 23-30. Your Body mass index is 22.67 kg/m. If this is out of the aforementioned range listed, please consider follow up with your Primary Care Provider.  If you are age 65 or younger, your body mass index should be between 19-25. Your Body mass index is 22.67 kg/m. If this is out of the aformentioned range listed, please consider follow up with your Primary Care Provider.   ________________________________________________________  The [facility] providers would like to encourage you to use [facility] to communicate with providers for non-urgent requests or questions.  Due to long hold times on the telephone, sending your provider a message by [facility] may be a faster and more efficient way to get a response.  Please allow 48 business hours for a response.  Please remember that this is for non-urgent requests.  _______________________________________________________   It has been recommended to you by your physician that you have a(n) Colonoscopy completed. We did not schedule the procedure(s) today. Please contact our office at [phone] in 2 weeks to see if we have a schedule available. We have given you blank instructions as well.  We have sent the following medications to your pharmacy for you to pick up at your convenience: Clenpiq  Please call in 6 months to make follow up appointment.  It was a pleasure to see you today!  Vito Cirigliano, D.O.

## 2021-02-22 NOTE — H&P (View-Only) (Signed)
Chief Complaint:    Ascites  GI History: 56 y.o. male with a history of cardiomyopathy with CHF EF 50-55 %, ESRD on HD MWF (was previously peritoneal dialysis x8 months until 03/2020), CVA, AV fistula, gout, asthma, anemia of chronic disease, OSA, HTN, mitral valve repair, BPH  s/p TURP then ureteral obstruction with suprapubic catheter placement 07/2020, gastric bypass, initially seen in GI clinic on 11/23/2020 for evaluation of new onset ascites.  Has been undergoing serial large-volume paracentesis every 2 weeks since 07/2020.  Initial diagnostic/therapeutic paracentesis on 07/13/2020 (6.7 L removed, negative for SBP. Low SAAG, high protein (3.7)).  Otherwise preserved hepatic synthetic function by labs (low albumin and elevated creatinine attributed to ESRD, otherwise normal INR, PLT, T. bili). - Negative/normal AMA, ANA, ASMA, A1 AT, ceruloplasmin, anti-LKM Ab, tTG, viral hepatitis panel, immunoglobulin panel - 11/2020: RUQ [place] with Dopplers: Monitor ascites, mildly atrophic but otherwise normal-appearing liver.  No nodularity.  Widely patent vasculature with appropriate flow  Currently prescribed lactulose as needed for constipation (no history of encephalopathy), furosemide 80 mg BID (still produces a small amount of urine).    Right heart cardiac catheterization on 10/19/2020 by [name].  Low filling pressures, normal PA pressure, normal cardiac output.  "Ascites does not appear to be due to right heart failure.  Suspect primary hepatic problem".   Last TTE was 07/2020: EF 50-55%.  Mitral valve repaired/replaced.  Endoscopic History: - Colonoscopy (12/2017): Moderate stool with fair visualization.  Internal hemorrhoids.  Normal TI.  Recommend repeat in 1-2 years due to suboptimal bowel prep   HPI:     Patient is a 56 y.o. male presenting to the Gastroenterology Clinic for follow-up.  Initially seen by me on 11/23/2020 for evaluation of new onset ascites.  Elevated suspicion for  nephrogenic ascites.  Recommended colonoscopy for routine CRC screening.  Last seen by [name] in the heart failure clinic on 01/06/2021.  Urethral reconstruction performed at [facility] in January and catheters removed. Back on Kidney transplant list at [facility].   Last paracentesis was 02/11/2021 with 3 L removed.  Continues to require paracentesis every 2 weeks with symptomatic improvement. Ranges 3-7 L removed. Will have some SOB, reduced appetite/PO intake when fluid builds up, which then resolves after paracentesis.  Review of systems:     No chest pain, no SOB, no fevers, no urinary sx   Past Medical History:  Diagnosis Date   ADHD    Anemia, chronic renal failure    Asthma    followed by pcp   Benign localized prostatic hyperplasia with lower urinary tract symptoms (LUTS)    Bladder stones    BPH (benign prostatic hyperplasia)    Chronic gout    05-06-2020  per pt last episode 6 months ago,  (involves right knee and right great toe)   Chronic systolic HF (heart failure) ([place])    followed by cardiology, dr Aundra Dubin @ CHF clinic,   last TEE 03-06-2019 in epic ef 45%   Dyspnea    ESRD on hemodialysis [facility])    primary nephrologist--- dr Moshe Cipro--- HD on M/W/ F  at [place] [facility] in [place] murmur    History of bladder stone    History of cellulitis 01/27/2020   ED visit ,  abdominal wall   History of CVA (cerebrovascular accident) without residual deficits    remote infarct per imaging MRI brain in care every where 02-07-2019   History of kidney stones  History of obstructive sleep apnea    05-06-2020  per pt test positive for osa prior to gastric bypass in 2014 used cpap,  pt stated was retested after alot of weight loss , test was negative    Hypertension    followed by cardiology   NICM (nonischemic cardiomyopathy) ([place])    followed by cardiology---- 01/ 2018  ef 20-35%, severe MV stenosis;  last TEE echo in epic 03-06-2019  ef 45%   Peripheral  edema    Bilateral lower extremity    S/P minimally invasive mitral valve repair followed by cardiology   06-29-2016  for severe stenosis ;  30 mm [name] via right mini thoracotomy approach;  last TEE echo in epic 03-06-2019 ef 45%, mild LVH, post op mild MR with mean gradient 48mmHg and no significant stenosis   Seasonal and perennial allergic rhinitis    Sleep apnea     Patient's surgical history, family medical history, social history, medications and allergies were all reviewed in Epic    Current Outpatient Medications  Medication Sig Dispense Refill   Albuterol Sulfate (PROAIR RESPICLICK) 413 (90 Base) MCG/ACT AEPB Inhale 2 puffs into the lungs every 6 (six) hours as needed (wheezing/shortness of breath).     atorvastatin (LIPITOR) 20 MG tablet Take 20 mg by mouth daily.     B Complex-C-Zn-Folic Acid (DIALYVITE 244 WITH ZINC) 0.8 MG TABS Take 1 tablet by mouth every evening.     Budeson-Glycopyrrol-Formoterol (BREZTRI AEROSPHERE) 160-9-4.8 MCG/ACT AERO Inhale 2 puffs into the lungs 2 (two) times daily as needed (congestion/respiratory issues.).     calcium acetate (PHOSLO) 667 MG capsule Take 2,668 mg by mouth 3 (three) times daily with meals.     carvedilol (COREG) 3.125 MG tablet Take 1 tablet (3.125 mg total) by mouth 2 (two) times daily with a meal. 180 tablet 3   Febuxostat 80 MG TABS Take 80 mg by mouth daily.     fexofenadine (ALLEGRA) 180 MG tablet Take 180 mg by mouth daily as needed for allergies.     furosemide (LASIX) 80 MG tablet Take 80 mg by mouth 2 (two) times daily.     lactulose (CHRONULAC) 10 GM/15ML solution Take 10 g by mouth 2 (two) times daily as needed for mild constipation.     lidocaine-prilocaine (EMLA) cream Apply 1 application topically daily as needed (prior to port being access).     polyethylene glycol (MIRALAX / GLYCOLAX) 17 g packet Take 17 g by mouth daily as needed (constipation every other day).     tamsulosin (FLOMAX) 0.4 MG  CAPS capsule Take 0.4 mg by mouth in the morning.     VYVANSE 30 MG capsule Take 30 mg by mouth every morning.     calcitRIOL (ROCALTROL) 0.5 MCG capsule Take 0.5 mcg by mouth every Monday, Wednesday, and Friday. (Patient not taking: Reported on 02/22/2021)     No current facility-administered medications for this visit.    Physical Exam:     Ht 5\' 9"  (1.753 m)    Wt 153 lb 8 oz (69.6 kg)    BMI 22.67 kg/m   GENERAL:  Pleasant male in NAD PSYCH: : Cooperative, normal affect NEURO: Alert and oriented x 3, no focal neurologic deficits   IMPRESSION and PLAN:    1) Colon cancer screening - Due for routine colon cancer screening - Schedule colonoscopy.  [facility] be [facility]  - Extended 2-day bowel preparation with Dulcolax 10 mg BID  x2 days, Miralax prep, then Clenpiq, along with clear diet x2 days  2) Ascites 3) ESRD on HD - Etiology: Nephrogenic ascites - Responsive to serial large-volume paracentesis.  Continue paracentesis Q2 weeks as currently doing - Currently prescribed Lasix 80 mg bid by Nephrology along with HD - Collect cell count, albumin, protein, Gram stain, and culture along with serum CMP with next paracentesis -Continue HD per Nephrology - Has follow-up with [facility]  4) Ischemic cardiomyopathy 5) CHF 6) Mitral valve repair 7) HTN - Follows closely in the Heart Failure clinic with [name] - Colonoscopy to be scheduled at [facility] due to elevated periprocedural risks from underlying comorbidities  RTC in 6 months or sooner as needed  The indications, risks, and benefits of colonoscopy were explained to the patient in detail. Risks include but are not limited to bleeding, perforation, adverse reaction to medications, and cardiopulmonary compromise. Sequelae include but are not limited to the possibility of surgery, hospitalization, and mortality. The patient verbalized understanding and wished to proceed. All questions answered,  referred to the scheduler and bowel prep ordered. Further recommendations pending results of the exam.            Dominic Pea Sedalia Greeson ,DO, FACG 02/22/2021, 9:15 AM

## 2021-02-23 DIAGNOSIS — N2581 Secondary hyperparathyroidism of renal origin: Secondary | ICD-10-CM | POA: Diagnosis not present

## 2021-02-23 DIAGNOSIS — N186 End stage renal disease: Secondary | ICD-10-CM | POA: Diagnosis not present

## 2021-02-23 DIAGNOSIS — Z992 Dependence on renal dialysis: Secondary | ICD-10-CM | POA: Diagnosis not present

## 2021-02-23 DIAGNOSIS — D509 Iron deficiency anemia, unspecified: Secondary | ICD-10-CM | POA: Diagnosis not present

## 2021-02-23 DIAGNOSIS — D689 Coagulation defect, unspecified: Secondary | ICD-10-CM | POA: Diagnosis not present

## 2021-02-23 DIAGNOSIS — D631 Anemia in chronic kidney disease: Secondary | ICD-10-CM | POA: Diagnosis not present

## 2021-02-24 DIAGNOSIS — T82858A Stenosis of vascular prosthetic devices, implants and grafts, initial encounter: Secondary | ICD-10-CM | POA: Diagnosis not present

## 2021-02-24 DIAGNOSIS — I871 Compression of vein: Secondary | ICD-10-CM | POA: Diagnosis not present

## 2021-02-24 DIAGNOSIS — Z992 Dependence on renal dialysis: Secondary | ICD-10-CM | POA: Diagnosis not present

## 2021-02-24 DIAGNOSIS — N186 End stage renal disease: Secondary | ICD-10-CM | POA: Diagnosis not present

## 2021-02-25 DIAGNOSIS — N186 End stage renal disease: Secondary | ICD-10-CM | POA: Diagnosis not present

## 2021-02-25 DIAGNOSIS — D631 Anemia in chronic kidney disease: Secondary | ICD-10-CM | POA: Diagnosis not present

## 2021-02-25 DIAGNOSIS — D509 Iron deficiency anemia, unspecified: Secondary | ICD-10-CM | POA: Diagnosis not present

## 2021-02-25 DIAGNOSIS — N2581 Secondary hyperparathyroidism of renal origin: Secondary | ICD-10-CM | POA: Diagnosis not present

## 2021-02-25 DIAGNOSIS — D689 Coagulation defect, unspecified: Secondary | ICD-10-CM | POA: Diagnosis not present

## 2021-02-25 DIAGNOSIS — Z992 Dependence on renal dialysis: Secondary | ICD-10-CM | POA: Diagnosis not present

## 2021-02-28 DIAGNOSIS — N2581 Secondary hyperparathyroidism of renal origin: Secondary | ICD-10-CM | POA: Diagnosis not present

## 2021-02-28 DIAGNOSIS — D689 Coagulation defect, unspecified: Secondary | ICD-10-CM | POA: Diagnosis not present

## 2021-02-28 DIAGNOSIS — D631 Anemia in chronic kidney disease: Secondary | ICD-10-CM | POA: Diagnosis not present

## 2021-02-28 DIAGNOSIS — D509 Iron deficiency anemia, unspecified: Secondary | ICD-10-CM | POA: Diagnosis not present

## 2021-02-28 DIAGNOSIS — N186 End stage renal disease: Secondary | ICD-10-CM | POA: Diagnosis not present

## 2021-02-28 DIAGNOSIS — Z992 Dependence on renal dialysis: Secondary | ICD-10-CM | POA: Diagnosis not present

## 2021-03-01 ENCOUNTER — Other Ambulatory Visit: Payer: Self-pay

## 2021-03-01 ENCOUNTER — Ambulatory Visit (HOSPITAL_COMMUNITY)
Admission: RE | Admit: 2021-03-01 | Discharge: 2021-03-01 | Disposition: A | Discharge status: Home / Self Care | Payer: Medicare Other | Source: Ambulatory Visit | Attending: Nephrology | Admitting: Nephrology

## 2021-03-01 DIAGNOSIS — N186 End stage renal disease: Secondary | ICD-10-CM | POA: Diagnosis not present

## 2021-03-01 DIAGNOSIS — I5020 Unspecified systolic (congestive) heart failure: Secondary | ICD-10-CM | POA: Diagnosis not present

## 2021-03-01 DIAGNOSIS — I129 Hypertensive chronic kidney disease with stage 1 through stage 4 chronic kidney disease, or unspecified chronic kidney disease: Secondary | ICD-10-CM | POA: Diagnosis not present

## 2021-03-01 DIAGNOSIS — Z992 Dependence on renal dialysis: Secondary | ICD-10-CM | POA: Diagnosis not present

## 2021-03-01 DIAGNOSIS — R188 Other ascites: Secondary | ICD-10-CM

## 2021-03-01 HISTORY — PX: IR PARACENTESIS: IMG2679

## 2021-03-01 MED ORDER — LIDOCAINE HCL 1 % IJ SOLN
INTRAMUSCULAR | Status: AC
  Administered 2021-03-01: 10 mL
  Filled 2021-03-01: qty 20

## 2021-03-01 NOTE — Procedures (Signed)
Ultrasound-guided therapeutic paracentesis performed yielding 5.5 liters of straw colored fluid.  No immediate complications. EBL is none.

## 2021-03-02 DIAGNOSIS — Z992 Dependence on renal dialysis: Secondary | ICD-10-CM | POA: Diagnosis not present

## 2021-03-02 DIAGNOSIS — N2581 Secondary hyperparathyroidism of renal origin: Secondary | ICD-10-CM | POA: Diagnosis not present

## 2021-03-02 DIAGNOSIS — D689 Coagulation defect, unspecified: Secondary | ICD-10-CM | POA: Diagnosis not present

## 2021-03-02 DIAGNOSIS — N186 End stage renal disease: Secondary | ICD-10-CM | POA: Diagnosis not present

## 2021-03-02 DIAGNOSIS — D509 Iron deficiency anemia, unspecified: Secondary | ICD-10-CM | POA: Diagnosis not present

## 2021-03-04 DIAGNOSIS — D509 Iron deficiency anemia, unspecified: Secondary | ICD-10-CM | POA: Diagnosis not present

## 2021-03-04 DIAGNOSIS — D689 Coagulation defect, unspecified: Secondary | ICD-10-CM | POA: Diagnosis not present

## 2021-03-04 DIAGNOSIS — Z992 Dependence on renal dialysis: Secondary | ICD-10-CM | POA: Diagnosis not present

## 2021-03-04 DIAGNOSIS — N2581 Secondary hyperparathyroidism of renal origin: Secondary | ICD-10-CM | POA: Diagnosis not present

## 2021-03-04 DIAGNOSIS — N186 End stage renal disease: Secondary | ICD-10-CM | POA: Diagnosis not present

## 2021-03-05 DIAGNOSIS — Z6821 Body mass index (BMI) 21.0-21.9, adult: Secondary | ICD-10-CM | POA: Diagnosis not present

## 2021-03-05 DIAGNOSIS — Z992 Dependence on renal dialysis: Secondary | ICD-10-CM | POA: Diagnosis not present

## 2021-03-05 DIAGNOSIS — J45909 Unspecified asthma, uncomplicated: Secondary | ICD-10-CM | POA: Diagnosis not present

## 2021-03-05 DIAGNOSIS — J309 Allergic rhinitis, unspecified: Secondary | ICD-10-CM | POA: Diagnosis not present

## 2021-03-05 DIAGNOSIS — I5020 Unspecified systolic (congestive) heart failure: Secondary | ICD-10-CM | POA: Diagnosis not present

## 2021-03-05 DIAGNOSIS — E785 Hyperlipidemia, unspecified: Secondary | ICD-10-CM | POA: Diagnosis not present

## 2021-03-05 DIAGNOSIS — I110 Hypertensive heart disease with heart failure: Secondary | ICD-10-CM | POA: Diagnosis not present

## 2021-03-05 DIAGNOSIS — N186 End stage renal disease: Secondary | ICD-10-CM | POA: Diagnosis not present

## 2021-03-05 DIAGNOSIS — H1010 Acute atopic conjunctivitis, unspecified eye: Secondary | ICD-10-CM | POA: Diagnosis not present

## 2021-03-05 DIAGNOSIS — M109 Gout, unspecified: Secondary | ICD-10-CM | POA: Diagnosis not present

## 2021-03-07 DIAGNOSIS — Z992 Dependence on renal dialysis: Secondary | ICD-10-CM | POA: Diagnosis not present

## 2021-03-07 DIAGNOSIS — D689 Coagulation defect, unspecified: Secondary | ICD-10-CM | POA: Diagnosis not present

## 2021-03-07 DIAGNOSIS — D509 Iron deficiency anemia, unspecified: Secondary | ICD-10-CM | POA: Diagnosis not present

## 2021-03-07 DIAGNOSIS — N2581 Secondary hyperparathyroidism of renal origin: Secondary | ICD-10-CM | POA: Diagnosis not present

## 2021-03-07 DIAGNOSIS — N186 End stage renal disease: Secondary | ICD-10-CM | POA: Diagnosis not present

## 2021-03-09 ENCOUNTER — Other Ambulatory Visit: Payer: Self-pay

## 2021-03-09 ENCOUNTER — Ambulatory Visit (HOSPITAL_COMMUNITY): Payer: Medicare Other | Admitting: Anesthesiology

## 2021-03-09 ENCOUNTER — Encounter (HOSPITAL_COMMUNITY): Payer: Self-pay | Admitting: Gastroenterology

## 2021-03-09 ENCOUNTER — Ambulatory Visit (HOSPITAL_BASED_OUTPATIENT_CLINIC_OR_DEPARTMENT_OTHER): Payer: Medicare Other | Admitting: Anesthesiology

## 2021-03-09 ENCOUNTER — Encounter (HOSPITAL_COMMUNITY)
Admission: RE | Disposition: A | Discharge status: Home / Self Care | Payer: Self-pay | Source: Home / Self Care | Attending: Gastroenterology

## 2021-03-09 ENCOUNTER — Ambulatory Visit (HOSPITAL_COMMUNITY)
Admission: RE | Admit: 2021-03-09 | Discharge: 2021-03-09 | Disposition: A | Discharge status: Home / Self Care | Payer: Medicare Other | Attending: Gastroenterology | Admitting: Gastroenterology

## 2021-03-09 DIAGNOSIS — I5022 Chronic systolic (congestive) heart failure: Secondary | ICD-10-CM | POA: Diagnosis not present

## 2021-03-09 DIAGNOSIS — J45909 Unspecified asthma, uncomplicated: Secondary | ICD-10-CM | POA: Insufficient documentation

## 2021-03-09 DIAGNOSIS — R188 Other ascites: Secondary | ICD-10-CM | POA: Diagnosis not present

## 2021-03-09 DIAGNOSIS — Z992 Dependence on renal dialysis: Secondary | ICD-10-CM | POA: Diagnosis not present

## 2021-03-09 DIAGNOSIS — K641 Second degree hemorrhoids: Secondary | ICD-10-CM

## 2021-03-09 DIAGNOSIS — M109 Gout, unspecified: Secondary | ICD-10-CM | POA: Diagnosis not present

## 2021-03-09 DIAGNOSIS — D631 Anemia in chronic kidney disease: Secondary | ICD-10-CM | POA: Insufficient documentation

## 2021-03-09 DIAGNOSIS — D175 Benign lipomatous neoplasm of intra-abdominal organs: Secondary | ICD-10-CM | POA: Diagnosis not present

## 2021-03-09 DIAGNOSIS — K5730 Diverticulosis of large intestine without perforation or abscess without bleeding: Secondary | ICD-10-CM

## 2021-03-09 DIAGNOSIS — D1779 Benign lipomatous neoplasm of other sites: Secondary | ICD-10-CM | POA: Diagnosis not present

## 2021-03-09 DIAGNOSIS — Z9889 Other specified postprocedural states: Secondary | ICD-10-CM | POA: Diagnosis not present

## 2021-03-09 DIAGNOSIS — Z9884 Bariatric surgery status: Secondary | ICD-10-CM | POA: Diagnosis not present

## 2021-03-09 DIAGNOSIS — K562 Volvulus: Secondary | ICD-10-CM | POA: Diagnosis not present

## 2021-03-09 DIAGNOSIS — D179 Benign lipomatous neoplasm, unspecified: Secondary | ICD-10-CM | POA: Diagnosis not present

## 2021-03-09 DIAGNOSIS — N400 Enlarged prostate without lower urinary tract symptoms: Secondary | ICD-10-CM | POA: Diagnosis not present

## 2021-03-09 DIAGNOSIS — Z952 Presence of prosthetic heart valve: Secondary | ICD-10-CM | POA: Diagnosis not present

## 2021-03-09 DIAGNOSIS — N186 End stage renal disease: Secondary | ICD-10-CM | POA: Insufficient documentation

## 2021-03-09 DIAGNOSIS — Z9989 Dependence on other enabling machines and devices: Secondary | ICD-10-CM | POA: Diagnosis not present

## 2021-03-09 DIAGNOSIS — Z1211 Encounter for screening for malignant neoplasm of colon: Secondary | ICD-10-CM

## 2021-03-09 DIAGNOSIS — G4733 Obstructive sleep apnea (adult) (pediatric): Secondary | ICD-10-CM | POA: Insufficient documentation

## 2021-03-09 DIAGNOSIS — I132 Hypertensive heart and chronic kidney disease with heart failure and with stage 5 chronic kidney disease, or end stage renal disease: Secondary | ICD-10-CM | POA: Diagnosis not present

## 2021-03-09 DIAGNOSIS — Z79899 Other long term (current) drug therapy: Secondary | ICD-10-CM | POA: Diagnosis not present

## 2021-03-09 DIAGNOSIS — I255 Ischemic cardiomyopathy: Secondary | ICD-10-CM | POA: Insufficient documentation

## 2021-03-09 DIAGNOSIS — K5792 Diverticulitis of intestine, part unspecified, without perforation or abscess without bleeding: Secondary | ICD-10-CM | POA: Diagnosis not present

## 2021-03-09 DIAGNOSIS — Z8673 Personal history of transient ischemic attack (TIA), and cerebral infarction without residual deficits: Secondary | ICD-10-CM | POA: Insufficient documentation

## 2021-03-09 HISTORY — PX: BIOPSY: SHX5522

## 2021-03-09 HISTORY — PX: COLONOSCOPY WITH PROPOFOL: SHX5780

## 2021-03-09 LAB — POCT I-STAT, CHEM 8
BUN: 62 mg/dL — ABNORMAL HIGH (ref 6–20)
Calcium, Ion: 0.96 mmol/L — ABNORMAL LOW (ref 1.15–1.40)
Chloride: 98 mmol/L (ref 98–111)
Creatinine, Ser: 8.3 mg/dL — ABNORMAL HIGH (ref 0.61–1.24)
Glucose, Bld: 77 mg/dL (ref 70–99)
HCT: 38 % — ABNORMAL LOW (ref 39.0–52.0)
Hemoglobin: 12.9 g/dL — ABNORMAL LOW (ref 13.0–17.0)
Potassium: 4.5 mmol/L (ref 3.5–5.1)
Sodium: 136 mmol/L (ref 135–145)
TCO2: 32 mmol/L (ref 22–32)

## 2021-03-09 SURGERY — COLONOSCOPY WITH PROPOFOL
Anesthesia: Monitor Anesthesia Care

## 2021-03-09 MED ORDER — PROPOFOL 10 MG/ML IV BOLUS
INTRAVENOUS | Status: DC | PRN
  Administered 2021-03-09: 10 mg via INTRAVENOUS
  Administered 2021-03-09: 20 mg via INTRAVENOUS
  Administered 2021-03-09: 10 mg via INTRAVENOUS

## 2021-03-09 MED ORDER — LIDOCAINE 2% (20 MG/ML) 5 ML SYRINGE
INTRAMUSCULAR | Status: DC | PRN
  Administered 2021-03-09: 100 mg via INTRAVENOUS

## 2021-03-09 MED ORDER — SODIUM CHLORIDE 0.9 % IV SOLN: INTRAVENOUS | Status: DC

## 2021-03-09 MED ORDER — PROPOFOL 500 MG/50ML IV EMUL
INTRAVENOUS | Status: DC | PRN
Start: 2021-03-09 — End: 2021-03-09
  Administered 2021-03-09: 125 ug/kg/min via INTRAVENOUS

## 2021-03-09 SURGICAL SUPPLY — 22 items

## 2021-03-09 NOTE — Op Note (Signed)
[facility] ?Patient Name: Vernon Blackburn ?Procedure Date: 03/09/2021 ?MRN: [id number] ?Attending MD: Gerrit Heck , MD ?Date of Birth: 1965-10-24 ?CSN: [id number] ?Age: 56 ?Admit Type: Outpatient ?Procedure:                Colonoscopy ?Indications:              Screening for colorectal malignant neoplasm ?                          Previous Colonoscopy was 12/2017 and n/f Moderate  ?                          stool with fair visualization. ??Internal  ?                          hemorrhoids. ??Normal TI. ??Recommend repeat in 1-2  ?                          years due to suboptimal bowel prep ?Providers:                Gerrit Heck, MD, Burtis Junes, RN, Janie Billups,  ?                          Technician, Despina Pole, Technician ?Referring MD:              ?Medicines:                Monitored Anesthesia Care ?Complications:            No immediate complications. ?Estimated Blood Loss:     Estimated blood loss was minimal. ?Procedure:                Pre-Anesthesia Assessment: ?                          - Prior to the procedure, a History and Physical  ?                          was performed, and patient medications and  ?                          allergies were reviewed. The patient's tolerance of  ?                          previous anesthesia was also reviewed. The risks  ?                          and benefits of the procedure and the sedation  ?                          options and risks were discussed with the patient.  ?                          All questions were answered, and informed consent  ?                          was  obtained. Prior Anticoagulants: The patient has  ?                          taken no previous anticoagulant or antiplatelet  ?                          agents. ASA Grade Assessment: III - A patient with  ?                          severe systemic disease. After reviewing the risks  ?                          and benefits, the patient was deemed in  ?                           satisfactory condition to undergo the procedure. ?                          After obtaining informed consent, the colonoscope  ?                          was passed under direct vision. Throughout the  ?                          procedure, the patient's blood pressure, pulse, and  ?                          oxygen saturations were monitored continuously. The  ?                          PCF-HQ190L ([id number]) Olympus colonoscope was  ?                          introduced through the anus and advanced to the the  ?                          cecum, identified by appendiceal orifice and  ?                          ileocecal valve. The colonoscopy was performed  ?                          without difficulty. The patient tolerated the  ?                          procedure well. The quality of the bowel  ?                          preparation was good. The ileocecal valve,  ?                          appendiceal orifice, and rectum were photographed. ?Scope In: 89:38:10 AM ?Scope Out: 17:51:02 PM ?Scope Withdrawal Time: 0 hours 14 minutes 6 seconds  ?Total Procedure Duration: 0 hours  20 minutes 8 seconds  ?Findings: ?     Hemorrhoids were found on perianal exam. ?     There was a medium-sized lipoma, in the ascending colon. Bite-on-bite  ?     tunneled biopsies were taken with a cold forceps for histology.  ?     Estimated blood loss was minimal. ?     There was a small lipoma, in the rectum. This had a typical yellow hue  ?     and positive pillow sign. ?     A few small-mouthed diverticula were found in the sigmoid colon,  ?     transverse colon and ascending colon. The remainder of the colon was  ?     otherwise normal appearing. ?     Non-bleeding internal hemorrhoids were found during retroflexion. The  ?     hemorrhoids were small and Grade II (internal hemorrhoids that prolapse  ?     but reduce spontaneously). ?     The ascending colon revealed moderately excessive looping. Advancing the  ?     scope required  using manual pressure. ?Impression:               - Hemorrhoids found on perianal exam. ?                          - Medium-sized lipoma in the ascending colon.  ?                          Biopsied. ?                          - Small lipoma in the rectum. ?                          - Diverticulosis in the sigmoid colon, in the  ?                          transverse colon and in the ascending colon. ?                          - Non-bleeding internal hemorrhoids. ?                          - There was significant looping of the colon. ?Moderate Sedation: ?     Not Applicable - Patient had care per Anesthesia. ?Recommendation:           - Patient has a contact number available for  ?                          emergencies. The signs and symptoms of potential  ?                          delayed complications were discussed with the  ?                          patient. Return to normal activities tomorrow.  ?  Written discharge instructions were provided to the  ?                          patient. ?                          - Resume previous diet. ?                          - Continue present medications. ?                          - Await pathology results. ?                          - Repeat colonoscopy in 10 years for screening  ?                          purposes. ?                          - Return to GI office PRN. ?Procedure Code(s):        --- Professional --- ?                          [phone], Colonoscopy, flexible; with biopsy, single  ?                          or multiple ?Diagnosis Code(s):        --- Professional --- ?                          Z12.11, Encounter for screening for malignant  ?                          neoplasm of colon ?                          K64.1, Second degree hemorrhoids ?                          D17.5, Benign lipomatous neoplasm of  ?                          intra-abdominal organs ?                          K57.30, Diverticulosis of large intestine without  ?                           perforation or abscess without bleeding ?CPT copyright 2019 American Medical Association. All rights reserved. ?The codes documented in this report are preliminary and upon coder review may  ?be revised to meet current compliance requirements. ?Gerrit Heck, MD ?03/09/2021 12:25:36 PM ?Number of Addenda: 0 ?

## 2021-03-09 NOTE — Transfer of Care (Signed)
Immediate Anesthesia Transfer of Care Note ? ?Patient: Vernon Blackburn ? ?Procedure(s) Performed: COLONOSCOPY WITH PROPOFOL ?BIOPSY ? ?Patient Location: PACU and Endoscopy Unit ? ?Anesthesia Type:MAC ? ?Level of Consciousness: sedated ? ?Airway & Oxygen Therapy: Patient Spontanous Breathing and Patient connected to face mask oxygen ? ?Post-op Assessment: Report given to RN and Post -op Vital signs reviewed and stable ? ?Post vital signs: Reviewed and stable ? ?Last Vitals:  ?Vitals Value Taken Time  ?BP    ?Temp    ?Pulse    ?Resp 16 03/09/21 1228  ?SpO2    ?Vitals shown include unvalidated device data. ? ?Last Pain:  ?Vitals:  ? 03/09/21 0924  ?TempSrc: Temporal  ?PainSc: 0-No pain  ?   ? ?  ? ?Complications: No notable events documented. ?

## 2021-03-09 NOTE — Interval H&P Note (Signed)
History and Physical Interval Note: ? ?03/09/2021 ?10:48 AM ? ?Vernon Blackburn  has presented today for surgery, with the diagnosis of colon cancer screening ascites.  The various methods of treatment have been discussed with the patient and family. After consideration of risks, benefits and other options for treatment, the patient has consented to  Procedure(s): ?COLONOSCOPY WITH PROPOFOL (N/A) as a surgical intervention.  The patient's history has been reviewed, patient examined, no change in status, stable for surgery.  I have reviewed the patient's chart and labs.  Questions were answered to the patient's satisfaction.   ? ? ?[name] V [name] ? ? ?

## 2021-03-09 NOTE — Anesthesia Preprocedure Evaluation (Signed)
Anesthesia Evaluation  ?Patient identified by MRN, date of birth, ID band ?Patient awake ? ? ? ?Reviewed: ?Allergy & Precautions, NPO status , Patient's Chart, lab work & pertinent test results ? ?Airway ?Mallampati: II ? ?TM Distance: >3 FB ?Neck ROM: Full ? ? ? Dental ?no notable dental hx. ? ?  ?Pulmonary ?asthma , sleep apnea and Continuous Positive Airway Pressure Ventilation ,  ?  ?Pulmonary exam normal ? ? ? ? ? ? ? Cardiovascular ?hypertension, Pt. on medications and Pt. on home beta blockers ?+CHF  ?+ Valvular Problems/Murmurs (s/p MVR 2018) MR  ?Rhythm:Regular Rate:Normal ? ? ?  ?Neuro/Psych ?negative neurological ROS ? negative psych ROS  ? GI/Hepatic ?negative GI ROS, Neg liver ROS,   ?Endo/Other  ?negative endocrine ROS ? Renal/GU ?ESRF and DialysisRenal disease  ?negative genitourinary ?  ?Musculoskeletal ?negative musculoskeletal ROS ?(+)  ? Abdominal ?Normal abdominal exam  (+)   ?Peds ? Hematology ? ?(+) Blood dyscrasia, anemia ,   ?Anesthesia Other Findings ? ? Reproductive/Obstetrics ? ?  ? ? ? ? ? ? ? ? ? ? ? ? ? ?  ?  ? ? ? ? ? ? ? ?Anesthesia Physical ?Anesthesia Plan ? ?ASA: 3 ? ?Anesthesia Plan: MAC  ? ?Post-op Pain Management:   ? ?Induction: Intravenous ? ?PONV Risk Score and Plan: 1 and Propofol infusion and Treatment may vary due to age or medical condition ? ?Airway Management Planned: Simple Face Mask, Natural Airway and Nasal Cannula ? ?Additional Equipment: None ? ?Intra-op Plan:  ? ?Post-operative Plan:  ? ?Informed Consent: I have reviewed the patients History and Physical, chart, labs and discussed the procedure including the risks, benefits and alternatives for the proposed anesthesia with the patient or authorized representative who has indicated his/her understanding and acceptance.  ? ? ? ?Dental advisory given ? ?Plan Discussed with: CRNA ? ?Anesthesia Plan Comments: (  ? ?ECHO 07/22: ??1. Left ventricular ejection fraction, by estimation, is  50 to 55%. The  ?left ventricle has low normal function. The left ventricle demonstrates  ?global hypokinesis. The left ventricular internal cavity size was mildly  ?dilated. Left ventricular diastolic  ?function could not be evaluated.  ??2. Right ventricular systolic function is normal. The right ventricular  ?size is normal.  ??3. The mitral valve has been repaired/replaced. Trivial mitral valve  ?regurgitation. No evidence of mitral stenosis. The mean mitral valve  ?gradient is 5.5 mmHg. There is a 30 mm Ring Mitral Memo 3D 30MM present in  ?the mitral position. Procedure Date:  ?06/29/2016.  ??4. The aortic valve is normal in structure. Aortic valve regurgitation is  ?not visualized. No aortic stenosis is present.  ??5. Aortic dilatation noted. There is dilatation of the aortic root,  ?measuring 37 mm.  ??6. The inferior vena cava is normal in size with greater than 50%  ?respiratory variability, suggesting right atrial pressure of 3 mmHg.  ??7. No significant change compated to TEE 2021 )  ? ? ? ? ? ? ?Anesthesia Quick Evaluation ? ?

## 2021-03-09 NOTE — Anesthesia Postprocedure Evaluation (Signed)
Anesthesia Post Note ? ?Patient: Vernon Blackburn ? ?Procedure(s) Performed: COLONOSCOPY WITH PROPOFOL ?BIOPSY ? ?  ? ?Patient location during evaluation: Endoscopy ?Anesthesia Type: MAC ?Level of consciousness: awake and alert ?Pain management: pain level controlled ?Vital Signs Assessment: post-procedure vital signs reviewed and stable ?Respiratory status: spontaneous breathing, nonlabored ventilation, respiratory function stable and patient connected to nasal cannula oxygen ?Cardiovascular status: stable and blood pressure returned to baseline ?Postop Assessment: no apparent nausea or vomiting ?Anesthetic complications: no ? ? ?No notable events documented. ? ?Last Vitals:  ?Vitals:  ? 03/09/21 1239 03/09/21 1246  ?BP: 129/77 132/80  ?Pulse:  68  ?Resp: 16 16  ?Temp:    ?SpO2:  97%  ?  ?Last Pain:  ?Vitals:  ? 03/09/21 1246  ?TempSrc:   ?PainSc: 0-No pain  ? ? ?  ?  ?  ?  ?  ?  ? ?March Rummage Gari Hartsell ? ? ? ? ?

## 2021-03-09 NOTE — Discharge Instructions (Signed)

## 2021-03-09 NOTE — Anesthesia Procedure Notes (Signed)
Procedure Name: [place] ?Date/Time: 03/09/2021 12:12 PM ?Performed by: Cynda Familia, CRNA ?Pre-anesthesia Checklist: Patient identified, Emergency Drugs available, Suction available, Patient being monitored and Timeout performed ?Patient Re-evaluated:Patient Re-evaluated prior to induction ?Oxygen Delivery Method: Simple face mask ?Placement Confirmation: positive ETCO2 and breath sounds checked- equal and bilateral ?Dental Injury: Teeth and Oropharynx as per pre-operative assessment  ? ? ? ? ?

## 2021-03-10 DIAGNOSIS — D509 Iron deficiency anemia, unspecified: Secondary | ICD-10-CM | POA: Diagnosis not present

## 2021-03-10 DIAGNOSIS — N2581 Secondary hyperparathyroidism of renal origin: Secondary | ICD-10-CM | POA: Diagnosis not present

## 2021-03-10 DIAGNOSIS — N186 End stage renal disease: Secondary | ICD-10-CM | POA: Diagnosis not present

## 2021-03-10 DIAGNOSIS — D689 Coagulation defect, unspecified: Secondary | ICD-10-CM | POA: Diagnosis not present

## 2021-03-10 DIAGNOSIS — Z992 Dependence on renal dialysis: Secondary | ICD-10-CM | POA: Diagnosis not present

## 2021-03-10 LAB — SURGICAL PATHOLOGY

## 2021-03-11 DIAGNOSIS — D689 Coagulation defect, unspecified: Secondary | ICD-10-CM | POA: Diagnosis not present

## 2021-03-11 DIAGNOSIS — N2581 Secondary hyperparathyroidism of renal origin: Secondary | ICD-10-CM | POA: Diagnosis not present

## 2021-03-11 DIAGNOSIS — D509 Iron deficiency anemia, unspecified: Secondary | ICD-10-CM | POA: Diagnosis not present

## 2021-03-11 DIAGNOSIS — Z992 Dependence on renal dialysis: Secondary | ICD-10-CM | POA: Diagnosis not present

## 2021-03-11 DIAGNOSIS — N186 End stage renal disease: Secondary | ICD-10-CM | POA: Diagnosis not present

## 2021-03-14 DIAGNOSIS — D509 Iron deficiency anemia, unspecified: Secondary | ICD-10-CM | POA: Diagnosis not present

## 2021-03-14 DIAGNOSIS — N2581 Secondary hyperparathyroidism of renal origin: Secondary | ICD-10-CM | POA: Diagnosis not present

## 2021-03-14 DIAGNOSIS — D689 Coagulation defect, unspecified: Secondary | ICD-10-CM | POA: Diagnosis not present

## 2021-03-14 DIAGNOSIS — N186 End stage renal disease: Secondary | ICD-10-CM | POA: Diagnosis not present

## 2021-03-14 DIAGNOSIS — Z992 Dependence on renal dialysis: Secondary | ICD-10-CM | POA: Diagnosis not present

## 2021-03-16 DIAGNOSIS — D509 Iron deficiency anemia, unspecified: Secondary | ICD-10-CM | POA: Diagnosis not present

## 2021-03-16 DIAGNOSIS — Z992 Dependence on renal dialysis: Secondary | ICD-10-CM | POA: Diagnosis not present

## 2021-03-16 DIAGNOSIS — N186 End stage renal disease: Secondary | ICD-10-CM | POA: Diagnosis not present

## 2021-03-16 DIAGNOSIS — D689 Coagulation defect, unspecified: Secondary | ICD-10-CM | POA: Diagnosis not present

## 2021-03-16 DIAGNOSIS — N2581 Secondary hyperparathyroidism of renal origin: Secondary | ICD-10-CM | POA: Diagnosis not present

## 2021-03-18 DIAGNOSIS — Z992 Dependence on renal dialysis: Secondary | ICD-10-CM | POA: Diagnosis not present

## 2021-03-18 DIAGNOSIS — D509 Iron deficiency anemia, unspecified: Secondary | ICD-10-CM | POA: Diagnosis not present

## 2021-03-18 DIAGNOSIS — N2581 Secondary hyperparathyroidism of renal origin: Secondary | ICD-10-CM | POA: Diagnosis not present

## 2021-03-18 DIAGNOSIS — N186 End stage renal disease: Secondary | ICD-10-CM | POA: Diagnosis not present

## 2021-03-18 DIAGNOSIS — D689 Coagulation defect, unspecified: Secondary | ICD-10-CM | POA: Diagnosis not present

## 2021-03-21 ENCOUNTER — Other Ambulatory Visit (HOSPITAL_COMMUNITY): Payer: Self-pay | Admitting: Nephrology

## 2021-03-21 ENCOUNTER — Other Ambulatory Visit: Payer: Self-pay | Admitting: Nephrology

## 2021-03-21 ENCOUNTER — Other Ambulatory Visit: Payer: Self-pay | Admitting: Allergy & Immunology

## 2021-03-21 DIAGNOSIS — D689 Coagulation defect, unspecified: Secondary | ICD-10-CM | POA: Diagnosis not present

## 2021-03-21 DIAGNOSIS — N186 End stage renal disease: Secondary | ICD-10-CM | POA: Diagnosis not present

## 2021-03-21 DIAGNOSIS — Z992 Dependence on renal dialysis: Secondary | ICD-10-CM | POA: Diagnosis not present

## 2021-03-21 DIAGNOSIS — N2581 Secondary hyperparathyroidism of renal origin: Secondary | ICD-10-CM | POA: Diagnosis not present

## 2021-03-21 DIAGNOSIS — D509 Iron deficiency anemia, unspecified: Secondary | ICD-10-CM | POA: Diagnosis not present

## 2021-03-21 DIAGNOSIS — R188 Other ascites: Secondary | ICD-10-CM

## 2021-03-23 DIAGNOSIS — Z992 Dependence on renal dialysis: Secondary | ICD-10-CM | POA: Diagnosis not present

## 2021-03-23 DIAGNOSIS — N2581 Secondary hyperparathyroidism of renal origin: Secondary | ICD-10-CM | POA: Diagnosis not present

## 2021-03-23 DIAGNOSIS — D689 Coagulation defect, unspecified: Secondary | ICD-10-CM | POA: Diagnosis not present

## 2021-03-23 DIAGNOSIS — D509 Iron deficiency anemia, unspecified: Secondary | ICD-10-CM | POA: Diagnosis not present

## 2021-03-23 DIAGNOSIS — N186 End stage renal disease: Secondary | ICD-10-CM | POA: Diagnosis not present

## 2021-03-24 ENCOUNTER — Ambulatory Visit (HOSPITAL_COMMUNITY)
Admission: RE | Admit: 2021-03-24 | Discharge: 2021-03-24 | Disposition: A | Discharge status: Home / Self Care | Payer: Medicare Other | Source: Ambulatory Visit | Attending: Nephrology | Admitting: Nephrology

## 2021-03-24 ENCOUNTER — Other Ambulatory Visit: Payer: Self-pay

## 2021-03-24 DIAGNOSIS — R188 Other ascites: Secondary | ICD-10-CM

## 2021-03-24 DIAGNOSIS — I5020 Unspecified systolic (congestive) heart failure: Secondary | ICD-10-CM | POA: Diagnosis not present

## 2021-03-24 HISTORY — PX: IR PARACENTESIS: IMG2679

## 2021-03-24 MED ORDER — LIDOCAINE HCL 1 % IJ SOLN
INTRAMUSCULAR | Status: AC
  Filled 2021-03-24: qty 20

## 2021-03-24 MED ORDER — LIDOCAINE HCL (PF) 1 % IJ SOLN
INTRAMUSCULAR | Status: DC | PRN
  Administered 2021-03-24: 10 mL

## 2021-03-24 NOTE — Progress Notes (Signed)
PROCEDURE SUMMARY: ? ?Successful US guided therapeutic paracentesis from LLQ.  ?Yielded 5 L of clear, yellow fluid.  ?No immediate complications.  ?Pt tolerated well.  ? ?Specimen not sent for labs. ? ?EBL < 1 mL ? ?Tyson Alias, AGNP ?03/24/2021 ?12:40 PM ?  ?

## 2021-03-25 DIAGNOSIS — Z992 Dependence on renal dialysis: Secondary | ICD-10-CM | POA: Diagnosis not present

## 2021-03-25 DIAGNOSIS — N186 End stage renal disease: Secondary | ICD-10-CM | POA: Diagnosis not present

## 2021-03-25 DIAGNOSIS — D689 Coagulation defect, unspecified: Secondary | ICD-10-CM | POA: Diagnosis not present

## 2021-03-25 DIAGNOSIS — D509 Iron deficiency anemia, unspecified: Secondary | ICD-10-CM | POA: Diagnosis not present

## 2021-03-25 DIAGNOSIS — N2581 Secondary hyperparathyroidism of renal origin: Secondary | ICD-10-CM | POA: Diagnosis not present

## 2021-03-28 DIAGNOSIS — D509 Iron deficiency anemia, unspecified: Secondary | ICD-10-CM | POA: Diagnosis not present

## 2021-03-28 DIAGNOSIS — N2581 Secondary hyperparathyroidism of renal origin: Secondary | ICD-10-CM | POA: Diagnosis not present

## 2021-03-28 DIAGNOSIS — Z992 Dependence on renal dialysis: Secondary | ICD-10-CM | POA: Diagnosis not present

## 2021-03-28 DIAGNOSIS — N186 End stage renal disease: Secondary | ICD-10-CM | POA: Diagnosis not present

## 2021-03-28 DIAGNOSIS — D689 Coagulation defect, unspecified: Secondary | ICD-10-CM | POA: Diagnosis not present

## 2021-03-30 DIAGNOSIS — Z992 Dependence on renal dialysis: Secondary | ICD-10-CM | POA: Diagnosis not present

## 2021-03-30 DIAGNOSIS — N2581 Secondary hyperparathyroidism of renal origin: Secondary | ICD-10-CM | POA: Diagnosis not present

## 2021-03-30 DIAGNOSIS — D631 Anemia in chronic kidney disease: Secondary | ICD-10-CM | POA: Diagnosis not present

## 2021-03-30 DIAGNOSIS — N186 End stage renal disease: Secondary | ICD-10-CM | POA: Diagnosis not present

## 2021-03-30 DIAGNOSIS — D689 Coagulation defect, unspecified: Secondary | ICD-10-CM | POA: Diagnosis not present

## 2021-04-01 DIAGNOSIS — E785 Hyperlipidemia, unspecified: Secondary | ICD-10-CM | POA: Diagnosis not present

## 2021-04-01 DIAGNOSIS — D689 Coagulation defect, unspecified: Secondary | ICD-10-CM | POA: Diagnosis not present

## 2021-04-01 DIAGNOSIS — I129 Hypertensive chronic kidney disease with stage 1 through stage 4 chronic kidney disease, or unspecified chronic kidney disease: Secondary | ICD-10-CM | POA: Diagnosis not present

## 2021-04-01 DIAGNOSIS — D631 Anemia in chronic kidney disease: Secondary | ICD-10-CM | POA: Diagnosis not present

## 2021-04-01 DIAGNOSIS — I110 Hypertensive heart disease with heart failure: Secondary | ICD-10-CM | POA: Diagnosis not present

## 2021-04-01 DIAGNOSIS — N186 End stage renal disease: Secondary | ICD-10-CM | POA: Diagnosis not present

## 2021-04-01 DIAGNOSIS — N2581 Secondary hyperparathyroidism of renal origin: Secondary | ICD-10-CM | POA: Diagnosis not present

## 2021-04-01 DIAGNOSIS — Z992 Dependence on renal dialysis: Secondary | ICD-10-CM | POA: Diagnosis not present

## 2021-04-01 DIAGNOSIS — I5020 Unspecified systolic (congestive) heart failure: Secondary | ICD-10-CM | POA: Diagnosis not present

## 2021-04-04 DIAGNOSIS — N186 End stage renal disease: Secondary | ICD-10-CM | POA: Diagnosis not present

## 2021-04-04 DIAGNOSIS — N2581 Secondary hyperparathyroidism of renal origin: Secondary | ICD-10-CM | POA: Diagnosis not present

## 2021-04-04 DIAGNOSIS — D509 Iron deficiency anemia, unspecified: Secondary | ICD-10-CM | POA: Diagnosis not present

## 2021-04-04 DIAGNOSIS — Z992 Dependence on renal dialysis: Secondary | ICD-10-CM | POA: Diagnosis not present

## 2021-04-04 DIAGNOSIS — D689 Coagulation defect, unspecified: Secondary | ICD-10-CM | POA: Diagnosis not present

## 2021-04-06 ENCOUNTER — Other Ambulatory Visit: Payer: Self-pay | Admitting: Nephrology

## 2021-04-06 ENCOUNTER — Other Ambulatory Visit (HOSPITAL_COMMUNITY): Payer: Self-pay | Admitting: Nephrology

## 2021-04-06 DIAGNOSIS — D689 Coagulation defect, unspecified: Secondary | ICD-10-CM | POA: Diagnosis not present

## 2021-04-06 DIAGNOSIS — D509 Iron deficiency anemia, unspecified: Secondary | ICD-10-CM | POA: Diagnosis not present

## 2021-04-06 DIAGNOSIS — N2581 Secondary hyperparathyroidism of renal origin: Secondary | ICD-10-CM | POA: Diagnosis not present

## 2021-04-06 DIAGNOSIS — R188 Other ascites: Secondary | ICD-10-CM

## 2021-04-06 DIAGNOSIS — Z992 Dependence on renal dialysis: Secondary | ICD-10-CM | POA: Diagnosis not present

## 2021-04-06 DIAGNOSIS — N186 End stage renal disease: Secondary | ICD-10-CM | POA: Diagnosis not present

## 2021-04-07 DIAGNOSIS — N35919 Unspecified urethral stricture, male, unspecified site: Secondary | ICD-10-CM | POA: Diagnosis not present

## 2021-04-07 DIAGNOSIS — I110 Hypertensive heart disease with heart failure: Secondary | ICD-10-CM | POA: Diagnosis not present

## 2021-04-07 DIAGNOSIS — I5022 Chronic systolic (congestive) heart failure: Secondary | ICD-10-CM | POA: Diagnosis not present

## 2021-04-07 DIAGNOSIS — N452 Orchitis: Secondary | ICD-10-CM | POA: Diagnosis not present

## 2021-04-08 ENCOUNTER — Ambulatory Visit (HOSPITAL_COMMUNITY)
Admission: RE | Admit: 2021-04-08 | Discharge: 2021-04-08 | Disposition: A | Discharge status: Home / Self Care | Payer: Medicare Other | Source: Ambulatory Visit | Attending: Nephrology | Admitting: Nephrology

## 2021-04-08 DIAGNOSIS — D689 Coagulation defect, unspecified: Secondary | ICD-10-CM | POA: Diagnosis not present

## 2021-04-08 DIAGNOSIS — N2581 Secondary hyperparathyroidism of renal origin: Secondary | ICD-10-CM | POA: Diagnosis not present

## 2021-04-08 DIAGNOSIS — R188 Other ascites: Secondary | ICD-10-CM | POA: Diagnosis not present

## 2021-04-08 DIAGNOSIS — D509 Iron deficiency anemia, unspecified: Secondary | ICD-10-CM | POA: Diagnosis not present

## 2021-04-08 DIAGNOSIS — Z992 Dependence on renal dialysis: Secondary | ICD-10-CM | POA: Diagnosis not present

## 2021-04-08 DIAGNOSIS — N186 End stage renal disease: Secondary | ICD-10-CM | POA: Diagnosis not present

## 2021-04-08 HISTORY — PX: IR PARACENTESIS: IMG2679

## 2021-04-08 MED ORDER — LIDOCAINE HCL (PF) 1 % IJ SOLN
INTRAMUSCULAR | Status: DC | PRN
  Administered 2021-04-08: 10 mL

## 2021-04-08 MED ORDER — LIDOCAINE HCL 1 % IJ SOLN
INTRAMUSCULAR | Status: AC
  Filled 2021-04-08: qty 20

## 2021-04-08 NOTE — Procedures (Signed)
PROCEDURE SUMMARY: ? ?Successful US guided therapeutic paracentesis from LLQ.  ?Yielded 3.2 L of clear, yellow fluid.  ?No immediate complications.  ?Pt tolerated well.  ? ?Specimen not sent for labs. ? ?EBL < 1 mL ? ?Tyson Alias, AGNP ?04/08/2021 ?9:30 AM ? ? ?

## 2021-04-11 DIAGNOSIS — D509 Iron deficiency anemia, unspecified: Secondary | ICD-10-CM | POA: Diagnosis not present

## 2021-04-11 DIAGNOSIS — Z992 Dependence on renal dialysis: Secondary | ICD-10-CM | POA: Diagnosis not present

## 2021-04-11 DIAGNOSIS — N2581 Secondary hyperparathyroidism of renal origin: Secondary | ICD-10-CM | POA: Diagnosis not present

## 2021-04-11 DIAGNOSIS — D689 Coagulation defect, unspecified: Secondary | ICD-10-CM | POA: Diagnosis not present

## 2021-04-11 DIAGNOSIS — N186 End stage renal disease: Secondary | ICD-10-CM | POA: Diagnosis not present

## 2021-04-13 DIAGNOSIS — D689 Coagulation defect, unspecified: Secondary | ICD-10-CM | POA: Diagnosis not present

## 2021-04-13 DIAGNOSIS — Z992 Dependence on renal dialysis: Secondary | ICD-10-CM | POA: Diagnosis not present

## 2021-04-13 DIAGNOSIS — D509 Iron deficiency anemia, unspecified: Secondary | ICD-10-CM | POA: Diagnosis not present

## 2021-04-13 DIAGNOSIS — N2581 Secondary hyperparathyroidism of renal origin: Secondary | ICD-10-CM | POA: Diagnosis not present

## 2021-04-13 DIAGNOSIS — N186 End stage renal disease: Secondary | ICD-10-CM | POA: Diagnosis not present

## 2021-04-15 DIAGNOSIS — I509 Heart failure, unspecified: Secondary | ICD-10-CM | POA: Diagnosis not present

## 2021-04-15 DIAGNOSIS — Z1159 Encounter for screening for other viral diseases: Secondary | ICD-10-CM | POA: Diagnosis not present

## 2021-04-15 DIAGNOSIS — D631 Anemia in chronic kidney disease: Secondary | ICD-10-CM | POA: Diagnosis not present

## 2021-04-15 DIAGNOSIS — I132 Hypertensive heart and chronic kidney disease with heart failure and with stage 5 chronic kidney disease, or end stage renal disease: Secondary | ICD-10-CM | POA: Diagnosis not present

## 2021-04-15 DIAGNOSIS — Z01818 Encounter for other preprocedural examination: Secondary | ICD-10-CM | POA: Diagnosis not present

## 2021-04-15 DIAGNOSIS — Z992 Dependence on renal dialysis: Secondary | ICD-10-CM | POA: Diagnosis not present

## 2021-04-15 DIAGNOSIS — Z79899 Other long term (current) drug therapy: Secondary | ICD-10-CM | POA: Diagnosis not present

## 2021-04-15 DIAGNOSIS — N186 End stage renal disease: Secondary | ICD-10-CM | POA: Diagnosis not present

## 2021-04-15 DIAGNOSIS — Z7682 Awaiting organ transplant status: Secondary | ICD-10-CM | POA: Diagnosis not present

## 2021-04-16 DIAGNOSIS — N2581 Secondary hyperparathyroidism of renal origin: Secondary | ICD-10-CM | POA: Diagnosis not present

## 2021-04-16 DIAGNOSIS — D689 Coagulation defect, unspecified: Secondary | ICD-10-CM | POA: Diagnosis not present

## 2021-04-16 DIAGNOSIS — Z992 Dependence on renal dialysis: Secondary | ICD-10-CM | POA: Diagnosis not present

## 2021-04-16 DIAGNOSIS — D509 Iron deficiency anemia, unspecified: Secondary | ICD-10-CM | POA: Diagnosis not present

## 2021-04-16 DIAGNOSIS — N186 End stage renal disease: Secondary | ICD-10-CM | POA: Diagnosis not present

## 2021-04-19 DIAGNOSIS — N186 End stage renal disease: Secondary | ICD-10-CM | POA: Diagnosis not present

## 2021-04-19 DIAGNOSIS — D509 Iron deficiency anemia, unspecified: Secondary | ICD-10-CM | POA: Diagnosis not present

## 2021-04-19 DIAGNOSIS — Z992 Dependence on renal dialysis: Secondary | ICD-10-CM | POA: Diagnosis not present

## 2021-04-19 DIAGNOSIS — N2581 Secondary hyperparathyroidism of renal origin: Secondary | ICD-10-CM | POA: Diagnosis not present

## 2021-04-19 DIAGNOSIS — D689 Coagulation defect, unspecified: Secondary | ICD-10-CM | POA: Diagnosis not present

## 2021-04-20 DIAGNOSIS — D689 Coagulation defect, unspecified: Secondary | ICD-10-CM | POA: Diagnosis not present

## 2021-04-20 DIAGNOSIS — N186 End stage renal disease: Secondary | ICD-10-CM | POA: Diagnosis not present

## 2021-04-20 DIAGNOSIS — N2581 Secondary hyperparathyroidism of renal origin: Secondary | ICD-10-CM | POA: Diagnosis not present

## 2021-04-20 DIAGNOSIS — D509 Iron deficiency anemia, unspecified: Secondary | ICD-10-CM | POA: Diagnosis not present

## 2021-04-20 DIAGNOSIS — Z992 Dependence on renal dialysis: Secondary | ICD-10-CM | POA: Diagnosis not present

## 2021-04-22 DIAGNOSIS — D509 Iron deficiency anemia, unspecified: Secondary | ICD-10-CM | POA: Diagnosis not present

## 2021-04-22 DIAGNOSIS — D689 Coagulation defect, unspecified: Secondary | ICD-10-CM | POA: Diagnosis not present

## 2021-04-22 DIAGNOSIS — N186 End stage renal disease: Secondary | ICD-10-CM | POA: Diagnosis not present

## 2021-04-22 DIAGNOSIS — N2581 Secondary hyperparathyroidism of renal origin: Secondary | ICD-10-CM | POA: Diagnosis not present

## 2021-04-22 DIAGNOSIS — Z992 Dependence on renal dialysis: Secondary | ICD-10-CM | POA: Diagnosis not present

## 2021-04-25 ENCOUNTER — Ambulatory Visit (HOSPITAL_COMMUNITY)
Admission: RE | Admit: 2021-04-25 | Discharge: 2021-04-25 | Disposition: A | Discharge status: Home / Self Care | Payer: Medicare Other | Source: Ambulatory Visit | Attending: Nephrology | Admitting: Nephrology

## 2021-04-25 DIAGNOSIS — I509 Heart failure, unspecified: Secondary | ICD-10-CM | POA: Diagnosis not present

## 2021-04-25 DIAGNOSIS — D509 Iron deficiency anemia, unspecified: Secondary | ICD-10-CM | POA: Diagnosis not present

## 2021-04-25 DIAGNOSIS — R188 Other ascites: Secondary | ICD-10-CM | POA: Insufficient documentation

## 2021-04-25 DIAGNOSIS — N2581 Secondary hyperparathyroidism of renal origin: Secondary | ICD-10-CM | POA: Diagnosis not present

## 2021-04-25 DIAGNOSIS — N186 End stage renal disease: Secondary | ICD-10-CM | POA: Diagnosis not present

## 2021-04-25 DIAGNOSIS — D689 Coagulation defect, unspecified: Secondary | ICD-10-CM | POA: Diagnosis not present

## 2021-04-25 DIAGNOSIS — Z992 Dependence on renal dialysis: Secondary | ICD-10-CM | POA: Diagnosis not present

## 2021-04-25 HISTORY — PX: IR PARACENTESIS: IMG2679

## 2021-04-25 MED ORDER — ALBUMIN HUMAN 25 % IV SOLN
INTRAVENOUS | Status: AC
  Administered 2021-04-25: 25 g via INTRAVENOUS
  Filled 2021-04-25: qty 100

## 2021-04-25 MED ORDER — ALBUMIN HUMAN 25 % IV SOLN
25.0000 g | Freq: Once | INTRAVENOUS | Status: AC
  Filled 2021-04-25: qty 100

## 2021-04-25 MED ORDER — LIDOCAINE HCL 1 % IJ SOLN
INTRAMUSCULAR | Status: AC
  Administered 2021-04-25: 10 mL
  Filled 2021-04-25: qty 20

## 2021-04-25 NOTE — Procedures (Signed)
Ultrasound-guided therapeutic paracentesis performed yielding 3.1 liters of straw colored fluid.No immediate complications. EBL is none. ? ?

## 2021-04-27 DIAGNOSIS — Z992 Dependence on renal dialysis: Secondary | ICD-10-CM | POA: Diagnosis not present

## 2021-04-27 DIAGNOSIS — D689 Coagulation defect, unspecified: Secondary | ICD-10-CM | POA: Diagnosis not present

## 2021-04-27 DIAGNOSIS — N2581 Secondary hyperparathyroidism of renal origin: Secondary | ICD-10-CM | POA: Diagnosis not present

## 2021-04-27 DIAGNOSIS — D509 Iron deficiency anemia, unspecified: Secondary | ICD-10-CM | POA: Diagnosis not present

## 2021-04-27 DIAGNOSIS — N186 End stage renal disease: Secondary | ICD-10-CM | POA: Diagnosis not present

## 2021-04-29 DIAGNOSIS — D509 Iron deficiency anemia, unspecified: Secondary | ICD-10-CM | POA: Diagnosis not present

## 2021-04-29 DIAGNOSIS — N186 End stage renal disease: Secondary | ICD-10-CM | POA: Diagnosis not present

## 2021-04-29 DIAGNOSIS — N2581 Secondary hyperparathyroidism of renal origin: Secondary | ICD-10-CM | POA: Diagnosis not present

## 2021-04-29 DIAGNOSIS — D689 Coagulation defect, unspecified: Secondary | ICD-10-CM | POA: Diagnosis not present

## 2021-04-29 DIAGNOSIS — Z992 Dependence on renal dialysis: Secondary | ICD-10-CM | POA: Diagnosis not present

## 2021-05-01 DIAGNOSIS — Z992 Dependence on renal dialysis: Secondary | ICD-10-CM | POA: Diagnosis not present

## 2021-05-01 DIAGNOSIS — I129 Hypertensive chronic kidney disease with stage 1 through stage 4 chronic kidney disease, or unspecified chronic kidney disease: Secondary | ICD-10-CM | POA: Diagnosis not present

## 2021-05-01 DIAGNOSIS — N186 End stage renal disease: Secondary | ICD-10-CM | POA: Diagnosis not present

## 2021-05-02 DIAGNOSIS — N186 End stage renal disease: Secondary | ICD-10-CM | POA: Diagnosis not present

## 2021-05-02 DIAGNOSIS — D509 Iron deficiency anemia, unspecified: Secondary | ICD-10-CM | POA: Diagnosis not present

## 2021-05-02 DIAGNOSIS — Z992 Dependence on renal dialysis: Secondary | ICD-10-CM | POA: Diagnosis not present

## 2021-05-02 DIAGNOSIS — N2581 Secondary hyperparathyroidism of renal origin: Secondary | ICD-10-CM | POA: Diagnosis not present

## 2021-05-02 DIAGNOSIS — D689 Coagulation defect, unspecified: Secondary | ICD-10-CM | POA: Diagnosis not present

## 2021-05-04 DIAGNOSIS — N2581 Secondary hyperparathyroidism of renal origin: Secondary | ICD-10-CM | POA: Diagnosis not present

## 2021-05-04 DIAGNOSIS — D509 Iron deficiency anemia, unspecified: Secondary | ICD-10-CM | POA: Diagnosis not present

## 2021-05-04 DIAGNOSIS — Z992 Dependence on renal dialysis: Secondary | ICD-10-CM | POA: Diagnosis not present

## 2021-05-04 DIAGNOSIS — N186 End stage renal disease: Secondary | ICD-10-CM | POA: Diagnosis not present

## 2021-05-04 DIAGNOSIS — D689 Coagulation defect, unspecified: Secondary | ICD-10-CM | POA: Diagnosis not present

## 2021-05-06 DIAGNOSIS — D509 Iron deficiency anemia, unspecified: Secondary | ICD-10-CM | POA: Diagnosis not present

## 2021-05-06 DIAGNOSIS — D689 Coagulation defect, unspecified: Secondary | ICD-10-CM | POA: Diagnosis not present

## 2021-05-06 DIAGNOSIS — N2581 Secondary hyperparathyroidism of renal origin: Secondary | ICD-10-CM | POA: Diagnosis not present

## 2021-05-06 DIAGNOSIS — N186 End stage renal disease: Secondary | ICD-10-CM | POA: Diagnosis not present

## 2021-05-06 DIAGNOSIS — Z992 Dependence on renal dialysis: Secondary | ICD-10-CM | POA: Diagnosis not present

## 2021-05-09 DIAGNOSIS — N2581 Secondary hyperparathyroidism of renal origin: Secondary | ICD-10-CM | POA: Diagnosis not present

## 2021-05-09 DIAGNOSIS — D689 Coagulation defect, unspecified: Secondary | ICD-10-CM | POA: Diagnosis not present

## 2021-05-09 DIAGNOSIS — D509 Iron deficiency anemia, unspecified: Secondary | ICD-10-CM | POA: Diagnosis not present

## 2021-05-09 DIAGNOSIS — N186 End stage renal disease: Secondary | ICD-10-CM | POA: Diagnosis not present

## 2021-05-09 DIAGNOSIS — Z992 Dependence on renal dialysis: Secondary | ICD-10-CM | POA: Diagnosis not present

## 2021-05-11 DIAGNOSIS — N186 End stage renal disease: Secondary | ICD-10-CM | POA: Diagnosis not present

## 2021-05-11 DIAGNOSIS — Z992 Dependence on renal dialysis: Secondary | ICD-10-CM | POA: Diagnosis not present

## 2021-05-11 DIAGNOSIS — D509 Iron deficiency anemia, unspecified: Secondary | ICD-10-CM | POA: Diagnosis not present

## 2021-05-11 DIAGNOSIS — N2581 Secondary hyperparathyroidism of renal origin: Secondary | ICD-10-CM | POA: Diagnosis not present

## 2021-05-11 DIAGNOSIS — D689 Coagulation defect, unspecified: Secondary | ICD-10-CM | POA: Diagnosis not present

## 2021-05-12 DIAGNOSIS — Z6823 Body mass index (BMI) 23.0-23.9, adult: Secondary | ICD-10-CM | POA: Diagnosis not present

## 2021-05-13 DIAGNOSIS — N2581 Secondary hyperparathyroidism of renal origin: Secondary | ICD-10-CM | POA: Diagnosis not present

## 2021-05-13 DIAGNOSIS — Z992 Dependence on renal dialysis: Secondary | ICD-10-CM | POA: Diagnosis not present

## 2021-05-13 DIAGNOSIS — D509 Iron deficiency anemia, unspecified: Secondary | ICD-10-CM | POA: Diagnosis not present

## 2021-05-13 DIAGNOSIS — N186 End stage renal disease: Secondary | ICD-10-CM | POA: Diagnosis not present

## 2021-05-13 DIAGNOSIS — D689 Coagulation defect, unspecified: Secondary | ICD-10-CM | POA: Diagnosis not present

## 2021-05-16 DIAGNOSIS — N186 End stage renal disease: Secondary | ICD-10-CM | POA: Diagnosis not present

## 2021-05-16 DIAGNOSIS — D509 Iron deficiency anemia, unspecified: Secondary | ICD-10-CM | POA: Diagnosis not present

## 2021-05-16 DIAGNOSIS — N2581 Secondary hyperparathyroidism of renal origin: Secondary | ICD-10-CM | POA: Diagnosis not present

## 2021-05-16 DIAGNOSIS — Z992 Dependence on renal dialysis: Secondary | ICD-10-CM | POA: Diagnosis not present

## 2021-05-16 DIAGNOSIS — D689 Coagulation defect, unspecified: Secondary | ICD-10-CM | POA: Diagnosis not present

## 2021-05-18 DIAGNOSIS — D689 Coagulation defect, unspecified: Secondary | ICD-10-CM | POA: Diagnosis not present

## 2021-05-18 DIAGNOSIS — Z992 Dependence on renal dialysis: Secondary | ICD-10-CM | POA: Diagnosis not present

## 2021-05-18 DIAGNOSIS — N186 End stage renal disease: Secondary | ICD-10-CM | POA: Diagnosis not present

## 2021-05-18 DIAGNOSIS — N2581 Secondary hyperparathyroidism of renal origin: Secondary | ICD-10-CM | POA: Diagnosis not present

## 2021-05-18 DIAGNOSIS — D509 Iron deficiency anemia, unspecified: Secondary | ICD-10-CM | POA: Diagnosis not present

## 2021-05-20 DIAGNOSIS — Z992 Dependence on renal dialysis: Secondary | ICD-10-CM | POA: Diagnosis not present

## 2021-05-20 DIAGNOSIS — N2581 Secondary hyperparathyroidism of renal origin: Secondary | ICD-10-CM | POA: Diagnosis not present

## 2021-05-20 DIAGNOSIS — N186 End stage renal disease: Secondary | ICD-10-CM | POA: Diagnosis not present

## 2021-05-20 DIAGNOSIS — D509 Iron deficiency anemia, unspecified: Secondary | ICD-10-CM | POA: Diagnosis not present

## 2021-05-20 DIAGNOSIS — D689 Coagulation defect, unspecified: Secondary | ICD-10-CM | POA: Diagnosis not present

## 2021-05-23 DIAGNOSIS — N2581 Secondary hyperparathyroidism of renal origin: Secondary | ICD-10-CM | POA: Diagnosis not present

## 2021-05-23 DIAGNOSIS — D509 Iron deficiency anemia, unspecified: Secondary | ICD-10-CM | POA: Diagnosis not present

## 2021-05-23 DIAGNOSIS — N186 End stage renal disease: Secondary | ICD-10-CM | POA: Diagnosis not present

## 2021-05-23 DIAGNOSIS — D689 Coagulation defect, unspecified: Secondary | ICD-10-CM | POA: Diagnosis not present

## 2021-05-23 DIAGNOSIS — Z992 Dependence on renal dialysis: Secondary | ICD-10-CM | POA: Diagnosis not present

## 2021-05-24 ENCOUNTER — Ambulatory Visit (HOSPITAL_COMMUNITY)
Admission: RE | Admit: 2021-05-24 | Discharge: 2021-05-24 | Disposition: A | Discharge status: Home / Self Care | Payer: Medicare Other | Source: Ambulatory Visit | Attending: Nephrology | Admitting: Nephrology

## 2021-05-24 DIAGNOSIS — N186 End stage renal disease: Secondary | ICD-10-CM | POA: Diagnosis not present

## 2021-05-24 DIAGNOSIS — R188 Other ascites: Secondary | ICD-10-CM | POA: Insufficient documentation

## 2021-05-24 HISTORY — PX: IR PARACENTESIS: IMG2679

## 2021-05-24 MED ORDER — LIDOCAINE HCL 1 % IJ SOLN
INTRAMUSCULAR | Status: AC
  Administered 2021-05-24: 10 mL
  Filled 2021-05-24: qty 20

## 2021-05-24 NOTE — Procedures (Signed)
PROCEDURE SUMMARY:  Successful US guided paracentesis from left abdomen.  Yielded 2L of clear yellow fluid.  No immediate complications.  Pt tolerated well; small hematoma developed at the skin insertion site when the [facility] was removed. Pressure was held. Patient denied pain/discomfort and was hemodynamically stable. Patient instructed to return to the ED if if developed pain, dizziness or changes in his heart rate.   Specimen not sent for labs.  EBL < 2 mL  Theresa Duty, NP 05/24/2021 2:25 PM  -

## 2021-05-25 DIAGNOSIS — D509 Iron deficiency anemia, unspecified: Secondary | ICD-10-CM | POA: Diagnosis not present

## 2021-05-25 DIAGNOSIS — N2581 Secondary hyperparathyroidism of renal origin: Secondary | ICD-10-CM | POA: Diagnosis not present

## 2021-05-25 DIAGNOSIS — N186 End stage renal disease: Secondary | ICD-10-CM | POA: Diagnosis not present

## 2021-05-25 DIAGNOSIS — Z992 Dependence on renal dialysis: Secondary | ICD-10-CM | POA: Diagnosis not present

## 2021-05-25 DIAGNOSIS — D689 Coagulation defect, unspecified: Secondary | ICD-10-CM | POA: Diagnosis not present

## 2021-05-27 DIAGNOSIS — D689 Coagulation defect, unspecified: Secondary | ICD-10-CM | POA: Diagnosis not present

## 2021-05-27 DIAGNOSIS — N186 End stage renal disease: Secondary | ICD-10-CM | POA: Diagnosis not present

## 2021-05-27 DIAGNOSIS — Z992 Dependence on renal dialysis: Secondary | ICD-10-CM | POA: Diagnosis not present

## 2021-05-27 DIAGNOSIS — N2581 Secondary hyperparathyroidism of renal origin: Secondary | ICD-10-CM | POA: Diagnosis not present

## 2021-05-27 DIAGNOSIS — D509 Iron deficiency anemia, unspecified: Secondary | ICD-10-CM | POA: Diagnosis not present

## 2021-05-31 DIAGNOSIS — Z992 Dependence on renal dialysis: Secondary | ICD-10-CM | POA: Diagnosis not present

## 2021-05-31 DIAGNOSIS — N2581 Secondary hyperparathyroidism of renal origin: Secondary | ICD-10-CM | POA: Diagnosis not present

## 2021-05-31 DIAGNOSIS — D509 Iron deficiency anemia, unspecified: Secondary | ICD-10-CM | POA: Diagnosis not present

## 2021-05-31 DIAGNOSIS — N186 End stage renal disease: Secondary | ICD-10-CM | POA: Diagnosis not present

## 2021-05-31 DIAGNOSIS — D689 Coagulation defect, unspecified: Secondary | ICD-10-CM | POA: Diagnosis not present

## 2021-06-01 DIAGNOSIS — I129 Hypertensive chronic kidney disease with stage 1 through stage 4 chronic kidney disease, or unspecified chronic kidney disease: Secondary | ICD-10-CM | POA: Diagnosis not present

## 2021-06-01 DIAGNOSIS — D509 Iron deficiency anemia, unspecified: Secondary | ICD-10-CM | POA: Diagnosis not present

## 2021-06-01 DIAGNOSIS — D689 Coagulation defect, unspecified: Secondary | ICD-10-CM | POA: Diagnosis not present

## 2021-06-01 DIAGNOSIS — N186 End stage renal disease: Secondary | ICD-10-CM | POA: Diagnosis not present

## 2021-06-01 DIAGNOSIS — Z992 Dependence on renal dialysis: Secondary | ICD-10-CM | POA: Diagnosis not present

## 2021-06-01 DIAGNOSIS — N2581 Secondary hyperparathyroidism of renal origin: Secondary | ICD-10-CM | POA: Diagnosis not present

## 2021-06-03 DIAGNOSIS — N2581 Secondary hyperparathyroidism of renal origin: Secondary | ICD-10-CM | POA: Diagnosis not present

## 2021-06-03 DIAGNOSIS — D509 Iron deficiency anemia, unspecified: Secondary | ICD-10-CM | POA: Diagnosis not present

## 2021-06-03 DIAGNOSIS — D689 Coagulation defect, unspecified: Secondary | ICD-10-CM | POA: Diagnosis not present

## 2021-06-03 DIAGNOSIS — Z992 Dependence on renal dialysis: Secondary | ICD-10-CM | POA: Diagnosis not present

## 2021-06-03 DIAGNOSIS — D631 Anemia in chronic kidney disease: Secondary | ICD-10-CM | POA: Diagnosis not present

## 2021-06-03 DIAGNOSIS — N186 End stage renal disease: Secondary | ICD-10-CM | POA: Diagnosis not present

## 2021-06-06 DIAGNOSIS — D509 Iron deficiency anemia, unspecified: Secondary | ICD-10-CM | POA: Diagnosis not present

## 2021-06-06 DIAGNOSIS — D631 Anemia in chronic kidney disease: Secondary | ICD-10-CM | POA: Diagnosis not present

## 2021-06-06 DIAGNOSIS — Z992 Dependence on renal dialysis: Secondary | ICD-10-CM | POA: Diagnosis not present

## 2021-06-06 DIAGNOSIS — D689 Coagulation defect, unspecified: Secondary | ICD-10-CM | POA: Diagnosis not present

## 2021-06-06 DIAGNOSIS — N2581 Secondary hyperparathyroidism of renal origin: Secondary | ICD-10-CM | POA: Diagnosis not present

## 2021-06-06 DIAGNOSIS — N186 End stage renal disease: Secondary | ICD-10-CM | POA: Diagnosis not present

## 2021-06-08 DIAGNOSIS — N186 End stage renal disease: Secondary | ICD-10-CM | POA: Diagnosis not present

## 2021-06-08 DIAGNOSIS — D689 Coagulation defect, unspecified: Secondary | ICD-10-CM | POA: Diagnosis not present

## 2021-06-08 DIAGNOSIS — D509 Iron deficiency anemia, unspecified: Secondary | ICD-10-CM | POA: Diagnosis not present

## 2021-06-08 DIAGNOSIS — D631 Anemia in chronic kidney disease: Secondary | ICD-10-CM | POA: Diagnosis not present

## 2021-06-08 DIAGNOSIS — N2581 Secondary hyperparathyroidism of renal origin: Secondary | ICD-10-CM | POA: Diagnosis not present

## 2021-06-08 DIAGNOSIS — Z992 Dependence on renal dialysis: Secondary | ICD-10-CM | POA: Diagnosis not present

## 2021-06-09 DIAGNOSIS — D631 Anemia in chronic kidney disease: Secondary | ICD-10-CM | POA: Diagnosis not present

## 2021-06-09 DIAGNOSIS — D509 Iron deficiency anemia, unspecified: Secondary | ICD-10-CM | POA: Diagnosis not present

## 2021-06-09 DIAGNOSIS — N2581 Secondary hyperparathyroidism of renal origin: Secondary | ICD-10-CM | POA: Diagnosis not present

## 2021-06-09 DIAGNOSIS — D689 Coagulation defect, unspecified: Secondary | ICD-10-CM | POA: Diagnosis not present

## 2021-06-09 DIAGNOSIS — N186 End stage renal disease: Secondary | ICD-10-CM | POA: Diagnosis not present

## 2021-06-09 DIAGNOSIS — Z992 Dependence on renal dialysis: Secondary | ICD-10-CM | POA: Diagnosis not present

## 2021-06-10 DIAGNOSIS — J45909 Unspecified asthma, uncomplicated: Secondary | ICD-10-CM | POA: Diagnosis not present

## 2021-06-10 DIAGNOSIS — N186 End stage renal disease: Secondary | ICD-10-CM | POA: Diagnosis not present

## 2021-06-10 DIAGNOSIS — Z79899 Other long term (current) drug therapy: Secondary | ICD-10-CM | POA: Diagnosis not present

## 2021-06-10 DIAGNOSIS — N320 Bladder-neck obstruction: Secondary | ICD-10-CM | POA: Diagnosis not present

## 2021-06-10 DIAGNOSIS — I132 Hypertensive heart and chronic kidney disease with heart failure and with stage 5 chronic kidney disease, or end stage renal disease: Secondary | ICD-10-CM | POA: Diagnosis not present

## 2021-06-10 DIAGNOSIS — M109 Gout, unspecified: Secondary | ICD-10-CM | POA: Diagnosis not present

## 2021-06-10 DIAGNOSIS — Z7982 Long term (current) use of aspirin: Secondary | ICD-10-CM | POA: Diagnosis not present

## 2021-06-10 DIAGNOSIS — I5022 Chronic systolic (congestive) heart failure: Secondary | ICD-10-CM | POA: Diagnosis not present

## 2021-06-13 DIAGNOSIS — D689 Coagulation defect, unspecified: Secondary | ICD-10-CM | POA: Diagnosis not present

## 2021-06-13 DIAGNOSIS — D631 Anemia in chronic kidney disease: Secondary | ICD-10-CM | POA: Diagnosis not present

## 2021-06-13 DIAGNOSIS — Z992 Dependence on renal dialysis: Secondary | ICD-10-CM | POA: Diagnosis not present

## 2021-06-13 DIAGNOSIS — N2581 Secondary hyperparathyroidism of renal origin: Secondary | ICD-10-CM | POA: Diagnosis not present

## 2021-06-13 DIAGNOSIS — N186 End stage renal disease: Secondary | ICD-10-CM | POA: Diagnosis not present

## 2021-06-13 DIAGNOSIS — D509 Iron deficiency anemia, unspecified: Secondary | ICD-10-CM | POA: Diagnosis not present

## 2021-06-15 ENCOUNTER — Other Ambulatory Visit (HOSPITAL_COMMUNITY): Payer: Self-pay | Admitting: Nephrology

## 2021-06-15 DIAGNOSIS — D509 Iron deficiency anemia, unspecified: Secondary | ICD-10-CM | POA: Diagnosis not present

## 2021-06-15 DIAGNOSIS — N2581 Secondary hyperparathyroidism of renal origin: Secondary | ICD-10-CM | POA: Diagnosis not present

## 2021-06-15 DIAGNOSIS — D631 Anemia in chronic kidney disease: Secondary | ICD-10-CM | POA: Diagnosis not present

## 2021-06-15 DIAGNOSIS — D689 Coagulation defect, unspecified: Secondary | ICD-10-CM | POA: Diagnosis not present

## 2021-06-15 DIAGNOSIS — Z992 Dependence on renal dialysis: Secondary | ICD-10-CM | POA: Diagnosis not present

## 2021-06-15 DIAGNOSIS — N186 End stage renal disease: Secondary | ICD-10-CM | POA: Diagnosis not present

## 2021-06-17 DIAGNOSIS — N186 End stage renal disease: Secondary | ICD-10-CM | POA: Diagnosis not present

## 2021-06-17 DIAGNOSIS — N2581 Secondary hyperparathyroidism of renal origin: Secondary | ICD-10-CM | POA: Diagnosis not present

## 2021-06-17 DIAGNOSIS — Z992 Dependence on renal dialysis: Secondary | ICD-10-CM | POA: Diagnosis not present

## 2021-06-17 DIAGNOSIS — D689 Coagulation defect, unspecified: Secondary | ICD-10-CM | POA: Diagnosis not present

## 2021-06-17 DIAGNOSIS — D631 Anemia in chronic kidney disease: Secondary | ICD-10-CM | POA: Diagnosis not present

## 2021-06-17 DIAGNOSIS — D509 Iron deficiency anemia, unspecified: Secondary | ICD-10-CM | POA: Diagnosis not present

## 2021-06-18 DIAGNOSIS — I5020 Unspecified systolic (congestive) heart failure: Secondary | ICD-10-CM | POA: Diagnosis not present

## 2021-06-18 DIAGNOSIS — J45909 Unspecified asthma, uncomplicated: Secondary | ICD-10-CM | POA: Diagnosis not present

## 2021-06-18 DIAGNOSIS — Z992 Dependence on renal dialysis: Secondary | ICD-10-CM | POA: Diagnosis not present

## 2021-06-18 DIAGNOSIS — E785 Hyperlipidemia, unspecified: Secondary | ICD-10-CM | POA: Diagnosis not present

## 2021-06-18 DIAGNOSIS — N186 End stage renal disease: Secondary | ICD-10-CM | POA: Diagnosis not present

## 2021-06-18 DIAGNOSIS — M109 Gout, unspecified: Secondary | ICD-10-CM | POA: Diagnosis not present

## 2021-06-18 DIAGNOSIS — I110 Hypertensive heart disease with heart failure: Secondary | ICD-10-CM | POA: Diagnosis not present

## 2021-06-20 DIAGNOSIS — Z992 Dependence on renal dialysis: Secondary | ICD-10-CM | POA: Diagnosis not present

## 2021-06-20 DIAGNOSIS — N2581 Secondary hyperparathyroidism of renal origin: Secondary | ICD-10-CM | POA: Diagnosis not present

## 2021-06-20 DIAGNOSIS — D509 Iron deficiency anemia, unspecified: Secondary | ICD-10-CM | POA: Diagnosis not present

## 2021-06-20 DIAGNOSIS — D689 Coagulation defect, unspecified: Secondary | ICD-10-CM | POA: Diagnosis not present

## 2021-06-20 DIAGNOSIS — N186 End stage renal disease: Secondary | ICD-10-CM | POA: Diagnosis not present

## 2021-06-20 DIAGNOSIS — D631 Anemia in chronic kidney disease: Secondary | ICD-10-CM | POA: Diagnosis not present

## 2021-06-21 ENCOUNTER — Ambulatory Visit (HOSPITAL_COMMUNITY)
Admission: RE | Admit: 2021-06-21 | Discharge: 2021-06-21 | Disposition: A | Discharge status: Home / Self Care | Payer: Medicare Other | Source: Ambulatory Visit | Attending: Nephrology | Admitting: Nephrology

## 2021-06-21 DIAGNOSIS — R188 Other ascites: Secondary | ICD-10-CM | POA: Insufficient documentation

## 2021-06-21 DIAGNOSIS — N186 End stage renal disease: Secondary | ICD-10-CM | POA: Diagnosis not present

## 2021-06-21 HISTORY — PX: IR PARACENTESIS: IMG2679

## 2021-06-21 MED ORDER — LIDOCAINE HCL 1 % IJ SOLN
INTRAMUSCULAR | Status: AC
  Administered 2021-06-21: 10 mL
  Filled 2021-06-21: qty 20

## 2021-06-21 MED ORDER — ALBUMIN HUMAN 5 % IV SOLN
25.0000 g | Freq: Once | INTRAVENOUS | Status: DC
Start: 2021-06-21 — End: 2021-06-22
  Filled 2021-06-21: qty 500

## 2021-06-21 MED ORDER — ALBUMIN HUMAN 25 % IV SOLN
INTRAVENOUS | Status: AC
  Administered 2021-06-21: 25 g
  Filled 2021-06-21: qty 100

## 2021-06-22 DIAGNOSIS — N2581 Secondary hyperparathyroidism of renal origin: Secondary | ICD-10-CM | POA: Diagnosis not present

## 2021-06-22 DIAGNOSIS — D509 Iron deficiency anemia, unspecified: Secondary | ICD-10-CM | POA: Diagnosis not present

## 2021-06-22 DIAGNOSIS — Z992 Dependence on renal dialysis: Secondary | ICD-10-CM | POA: Diagnosis not present

## 2021-06-22 DIAGNOSIS — N186 End stage renal disease: Secondary | ICD-10-CM | POA: Diagnosis not present

## 2021-06-22 DIAGNOSIS — D689 Coagulation defect, unspecified: Secondary | ICD-10-CM | POA: Diagnosis not present

## 2021-06-22 DIAGNOSIS — D631 Anemia in chronic kidney disease: Secondary | ICD-10-CM | POA: Diagnosis not present

## 2021-06-24 DIAGNOSIS — D689 Coagulation defect, unspecified: Secondary | ICD-10-CM | POA: Diagnosis not present

## 2021-06-24 DIAGNOSIS — Z992 Dependence on renal dialysis: Secondary | ICD-10-CM | POA: Diagnosis not present

## 2021-06-24 DIAGNOSIS — N2581 Secondary hyperparathyroidism of renal origin: Secondary | ICD-10-CM | POA: Diagnosis not present

## 2021-06-24 DIAGNOSIS — D631 Anemia in chronic kidney disease: Secondary | ICD-10-CM | POA: Diagnosis not present

## 2021-06-24 DIAGNOSIS — N186 End stage renal disease: Secondary | ICD-10-CM | POA: Diagnosis not present

## 2021-06-24 DIAGNOSIS — D509 Iron deficiency anemia, unspecified: Secondary | ICD-10-CM | POA: Diagnosis not present

## 2021-06-27 DIAGNOSIS — Z992 Dependence on renal dialysis: Secondary | ICD-10-CM | POA: Diagnosis not present

## 2021-06-27 DIAGNOSIS — D509 Iron deficiency anemia, unspecified: Secondary | ICD-10-CM | POA: Diagnosis not present

## 2021-06-27 DIAGNOSIS — N186 End stage renal disease: Secondary | ICD-10-CM | POA: Diagnosis not present

## 2021-06-27 DIAGNOSIS — D631 Anemia in chronic kidney disease: Secondary | ICD-10-CM | POA: Diagnosis not present

## 2021-06-27 DIAGNOSIS — N2581 Secondary hyperparathyroidism of renal origin: Secondary | ICD-10-CM | POA: Diagnosis not present

## 2021-06-27 DIAGNOSIS — D689 Coagulation defect, unspecified: Secondary | ICD-10-CM | POA: Diagnosis not present

## 2021-06-29 DIAGNOSIS — N2581 Secondary hyperparathyroidism of renal origin: Secondary | ICD-10-CM | POA: Diagnosis not present

## 2021-06-29 DIAGNOSIS — D509 Iron deficiency anemia, unspecified: Secondary | ICD-10-CM | POA: Diagnosis not present

## 2021-06-29 DIAGNOSIS — D631 Anemia in chronic kidney disease: Secondary | ICD-10-CM | POA: Diagnosis not present

## 2021-06-29 DIAGNOSIS — Z992 Dependence on renal dialysis: Secondary | ICD-10-CM | POA: Diagnosis not present

## 2021-06-29 DIAGNOSIS — N186 End stage renal disease: Secondary | ICD-10-CM | POA: Diagnosis not present

## 2021-06-29 DIAGNOSIS — D689 Coagulation defect, unspecified: Secondary | ICD-10-CM | POA: Diagnosis not present

## 2021-07-01 DIAGNOSIS — I129 Hypertensive chronic kidney disease with stage 1 through stage 4 chronic kidney disease, or unspecified chronic kidney disease: Secondary | ICD-10-CM | POA: Diagnosis not present

## 2021-07-01 DIAGNOSIS — D631 Anemia in chronic kidney disease: Secondary | ICD-10-CM | POA: Diagnosis not present

## 2021-07-01 DIAGNOSIS — Z992 Dependence on renal dialysis: Secondary | ICD-10-CM | POA: Diagnosis not present

## 2021-07-01 DIAGNOSIS — N186 End stage renal disease: Secondary | ICD-10-CM | POA: Diagnosis not present

## 2021-07-01 DIAGNOSIS — D509 Iron deficiency anemia, unspecified: Secondary | ICD-10-CM | POA: Diagnosis not present

## 2021-07-01 DIAGNOSIS — N2581 Secondary hyperparathyroidism of renal origin: Secondary | ICD-10-CM | POA: Diagnosis not present

## 2021-07-01 DIAGNOSIS — D689 Coagulation defect, unspecified: Secondary | ICD-10-CM | POA: Diagnosis not present

## 2021-07-04 DIAGNOSIS — I959 Hypotension, unspecified: Secondary | ICD-10-CM | POA: Diagnosis not present

## 2021-07-04 DIAGNOSIS — N2581 Secondary hyperparathyroidism of renal origin: Secondary | ICD-10-CM | POA: Diagnosis not present

## 2021-07-04 DIAGNOSIS — D509 Iron deficiency anemia, unspecified: Secondary | ICD-10-CM | POA: Diagnosis not present

## 2021-07-04 DIAGNOSIS — Z992 Dependence on renal dialysis: Secondary | ICD-10-CM | POA: Diagnosis not present

## 2021-07-04 DIAGNOSIS — D689 Coagulation defect, unspecified: Secondary | ICD-10-CM | POA: Diagnosis not present

## 2021-07-04 DIAGNOSIS — N186 End stage renal disease: Secondary | ICD-10-CM | POA: Diagnosis not present

## 2021-07-04 DIAGNOSIS — Z7682 Awaiting organ transplant status: Secondary | ICD-10-CM | POA: Diagnosis not present

## 2021-07-06 DIAGNOSIS — N186 End stage renal disease: Secondary | ICD-10-CM | POA: Diagnosis not present

## 2021-07-06 DIAGNOSIS — D689 Coagulation defect, unspecified: Secondary | ICD-10-CM | POA: Diagnosis not present

## 2021-07-06 DIAGNOSIS — D509 Iron deficiency anemia, unspecified: Secondary | ICD-10-CM | POA: Diagnosis not present

## 2021-07-06 DIAGNOSIS — N2581 Secondary hyperparathyroidism of renal origin: Secondary | ICD-10-CM | POA: Diagnosis not present

## 2021-07-06 DIAGNOSIS — I959 Hypotension, unspecified: Secondary | ICD-10-CM | POA: Diagnosis not present

## 2021-07-06 DIAGNOSIS — Z992 Dependence on renal dialysis: Secondary | ICD-10-CM | POA: Diagnosis not present

## 2021-07-08 DIAGNOSIS — Z992 Dependence on renal dialysis: Secondary | ICD-10-CM | POA: Diagnosis not present

## 2021-07-08 DIAGNOSIS — N186 End stage renal disease: Secondary | ICD-10-CM | POA: Diagnosis not present

## 2021-07-08 DIAGNOSIS — D689 Coagulation defect, unspecified: Secondary | ICD-10-CM | POA: Diagnosis not present

## 2021-07-08 DIAGNOSIS — N2581 Secondary hyperparathyroidism of renal origin: Secondary | ICD-10-CM | POA: Diagnosis not present

## 2021-07-08 DIAGNOSIS — D509 Iron deficiency anemia, unspecified: Secondary | ICD-10-CM | POA: Diagnosis not present

## 2021-07-08 DIAGNOSIS — I959 Hypotension, unspecified: Secondary | ICD-10-CM | POA: Diagnosis not present

## 2021-07-11 DIAGNOSIS — D689 Coagulation defect, unspecified: Secondary | ICD-10-CM | POA: Diagnosis not present

## 2021-07-11 DIAGNOSIS — Z992 Dependence on renal dialysis: Secondary | ICD-10-CM | POA: Diagnosis not present

## 2021-07-11 DIAGNOSIS — N2581 Secondary hyperparathyroidism of renal origin: Secondary | ICD-10-CM | POA: Diagnosis not present

## 2021-07-11 DIAGNOSIS — D509 Iron deficiency anemia, unspecified: Secondary | ICD-10-CM | POA: Diagnosis not present

## 2021-07-11 DIAGNOSIS — I959 Hypotension, unspecified: Secondary | ICD-10-CM | POA: Diagnosis not present

## 2021-07-11 DIAGNOSIS — N186 End stage renal disease: Secondary | ICD-10-CM | POA: Diagnosis not present

## 2021-07-13 DIAGNOSIS — N186 End stage renal disease: Secondary | ICD-10-CM | POA: Diagnosis not present

## 2021-07-13 DIAGNOSIS — I959 Hypotension, unspecified: Secondary | ICD-10-CM | POA: Diagnosis not present

## 2021-07-13 DIAGNOSIS — N2581 Secondary hyperparathyroidism of renal origin: Secondary | ICD-10-CM | POA: Diagnosis not present

## 2021-07-13 DIAGNOSIS — D689 Coagulation defect, unspecified: Secondary | ICD-10-CM | POA: Diagnosis not present

## 2021-07-13 DIAGNOSIS — D509 Iron deficiency anemia, unspecified: Secondary | ICD-10-CM | POA: Diagnosis not present

## 2021-07-13 DIAGNOSIS — Z992 Dependence on renal dialysis: Secondary | ICD-10-CM | POA: Diagnosis not present

## 2021-07-15 DIAGNOSIS — Z992 Dependence on renal dialysis: Secondary | ICD-10-CM | POA: Diagnosis not present

## 2021-07-15 DIAGNOSIS — D689 Coagulation defect, unspecified: Secondary | ICD-10-CM | POA: Diagnosis not present

## 2021-07-15 DIAGNOSIS — D509 Iron deficiency anemia, unspecified: Secondary | ICD-10-CM | POA: Diagnosis not present

## 2021-07-15 DIAGNOSIS — N186 End stage renal disease: Secondary | ICD-10-CM | POA: Diagnosis not present

## 2021-07-15 DIAGNOSIS — N2581 Secondary hyperparathyroidism of renal origin: Secondary | ICD-10-CM | POA: Diagnosis not present

## 2021-07-15 DIAGNOSIS — I959 Hypotension, unspecified: Secondary | ICD-10-CM | POA: Diagnosis not present

## 2021-07-18 DIAGNOSIS — N186 End stage renal disease: Secondary | ICD-10-CM | POA: Diagnosis not present

## 2021-07-18 DIAGNOSIS — N2581 Secondary hyperparathyroidism of renal origin: Secondary | ICD-10-CM | POA: Diagnosis not present

## 2021-07-18 DIAGNOSIS — D689 Coagulation defect, unspecified: Secondary | ICD-10-CM | POA: Diagnosis not present

## 2021-07-18 DIAGNOSIS — I959 Hypotension, unspecified: Secondary | ICD-10-CM | POA: Diagnosis not present

## 2021-07-18 DIAGNOSIS — D509 Iron deficiency anemia, unspecified: Secondary | ICD-10-CM | POA: Diagnosis not present

## 2021-07-18 DIAGNOSIS — Z992 Dependence on renal dialysis: Secondary | ICD-10-CM | POA: Diagnosis not present

## 2021-07-20 DIAGNOSIS — Z992 Dependence on renal dialysis: Secondary | ICD-10-CM | POA: Diagnosis not present

## 2021-07-20 DIAGNOSIS — D689 Coagulation defect, unspecified: Secondary | ICD-10-CM | POA: Diagnosis not present

## 2021-07-20 DIAGNOSIS — N2581 Secondary hyperparathyroidism of renal origin: Secondary | ICD-10-CM | POA: Diagnosis not present

## 2021-07-20 DIAGNOSIS — I959 Hypotension, unspecified: Secondary | ICD-10-CM | POA: Diagnosis not present

## 2021-07-20 DIAGNOSIS — N186 End stage renal disease: Secondary | ICD-10-CM | POA: Diagnosis not present

## 2021-07-20 DIAGNOSIS — D509 Iron deficiency anemia, unspecified: Secondary | ICD-10-CM | POA: Diagnosis not present

## 2021-07-22 DIAGNOSIS — D689 Coagulation defect, unspecified: Secondary | ICD-10-CM | POA: Diagnosis not present

## 2021-07-22 DIAGNOSIS — D509 Iron deficiency anemia, unspecified: Secondary | ICD-10-CM | POA: Diagnosis not present

## 2021-07-22 DIAGNOSIS — N2581 Secondary hyperparathyroidism of renal origin: Secondary | ICD-10-CM | POA: Diagnosis not present

## 2021-07-22 DIAGNOSIS — I959 Hypotension, unspecified: Secondary | ICD-10-CM | POA: Diagnosis not present

## 2021-07-22 DIAGNOSIS — N186 End stage renal disease: Secondary | ICD-10-CM | POA: Diagnosis not present

## 2021-07-22 DIAGNOSIS — Z992 Dependence on renal dialysis: Secondary | ICD-10-CM | POA: Diagnosis not present

## 2021-07-25 DIAGNOSIS — N186 End stage renal disease: Secondary | ICD-10-CM | POA: Diagnosis not present

## 2021-07-25 DIAGNOSIS — I959 Hypotension, unspecified: Secondary | ICD-10-CM | POA: Diagnosis not present

## 2021-07-25 DIAGNOSIS — N2581 Secondary hyperparathyroidism of renal origin: Secondary | ICD-10-CM | POA: Diagnosis not present

## 2021-07-25 DIAGNOSIS — D689 Coagulation defect, unspecified: Secondary | ICD-10-CM | POA: Diagnosis not present

## 2021-07-25 DIAGNOSIS — D509 Iron deficiency anemia, unspecified: Secondary | ICD-10-CM | POA: Diagnosis not present

## 2021-07-25 DIAGNOSIS — Z992 Dependence on renal dialysis: Secondary | ICD-10-CM | POA: Diagnosis not present

## 2021-07-27 DIAGNOSIS — D509 Iron deficiency anemia, unspecified: Secondary | ICD-10-CM | POA: Diagnosis not present

## 2021-07-27 DIAGNOSIS — D689 Coagulation defect, unspecified: Secondary | ICD-10-CM | POA: Diagnosis not present

## 2021-07-27 DIAGNOSIS — Z992 Dependence on renal dialysis: Secondary | ICD-10-CM | POA: Diagnosis not present

## 2021-07-27 DIAGNOSIS — I959 Hypotension, unspecified: Secondary | ICD-10-CM | POA: Diagnosis not present

## 2021-07-27 DIAGNOSIS — N2581 Secondary hyperparathyroidism of renal origin: Secondary | ICD-10-CM | POA: Diagnosis not present

## 2021-07-27 DIAGNOSIS — N186 End stage renal disease: Secondary | ICD-10-CM | POA: Diagnosis not present

## 2021-07-30 DIAGNOSIS — I959 Hypotension, unspecified: Secondary | ICD-10-CM | POA: Diagnosis not present

## 2021-07-30 DIAGNOSIS — N186 End stage renal disease: Secondary | ICD-10-CM | POA: Diagnosis not present

## 2021-07-30 DIAGNOSIS — N2581 Secondary hyperparathyroidism of renal origin: Secondary | ICD-10-CM | POA: Diagnosis not present

## 2021-07-30 DIAGNOSIS — Z992 Dependence on renal dialysis: Secondary | ICD-10-CM | POA: Diagnosis not present

## 2021-07-30 DIAGNOSIS — D689 Coagulation defect, unspecified: Secondary | ICD-10-CM | POA: Diagnosis not present

## 2021-07-30 DIAGNOSIS — D509 Iron deficiency anemia, unspecified: Secondary | ICD-10-CM | POA: Diagnosis not present

## 2021-08-01 DIAGNOSIS — D689 Coagulation defect, unspecified: Secondary | ICD-10-CM | POA: Diagnosis not present

## 2021-08-01 DIAGNOSIS — D509 Iron deficiency anemia, unspecified: Secondary | ICD-10-CM | POA: Diagnosis not present

## 2021-08-01 DIAGNOSIS — Z992 Dependence on renal dialysis: Secondary | ICD-10-CM | POA: Diagnosis not present

## 2021-08-01 DIAGNOSIS — I959 Hypotension, unspecified: Secondary | ICD-10-CM | POA: Diagnosis not present

## 2021-08-01 DIAGNOSIS — N2581 Secondary hyperparathyroidism of renal origin: Secondary | ICD-10-CM | POA: Diagnosis not present

## 2021-08-01 DIAGNOSIS — I129 Hypertensive chronic kidney disease with stage 1 through stage 4 chronic kidney disease, or unspecified chronic kidney disease: Secondary | ICD-10-CM | POA: Diagnosis not present

## 2021-08-01 DIAGNOSIS — N186 End stage renal disease: Secondary | ICD-10-CM | POA: Diagnosis not present

## 2021-08-03 DIAGNOSIS — N186 End stage renal disease: Secondary | ICD-10-CM | POA: Diagnosis not present

## 2021-08-03 DIAGNOSIS — L299 Pruritus, unspecified: Secondary | ICD-10-CM | POA: Diagnosis not present

## 2021-08-03 DIAGNOSIS — D689 Coagulation defect, unspecified: Secondary | ICD-10-CM | POA: Diagnosis not present

## 2021-08-03 DIAGNOSIS — D509 Iron deficiency anemia, unspecified: Secondary | ICD-10-CM | POA: Diagnosis not present

## 2021-08-03 DIAGNOSIS — N2581 Secondary hyperparathyroidism of renal origin: Secondary | ICD-10-CM | POA: Diagnosis not present

## 2021-08-03 DIAGNOSIS — Z992 Dependence on renal dialysis: Secondary | ICD-10-CM | POA: Diagnosis not present

## 2021-08-04 ENCOUNTER — Other Ambulatory Visit (HOSPITAL_COMMUNITY): Payer: Self-pay | Admitting: Nephrology

## 2021-08-04 ENCOUNTER — Other Ambulatory Visit: Payer: Self-pay | Admitting: Nephrology

## 2021-08-04 DIAGNOSIS — R188 Other ascites: Secondary | ICD-10-CM

## 2021-08-05 DIAGNOSIS — Z992 Dependence on renal dialysis: Secondary | ICD-10-CM | POA: Diagnosis not present

## 2021-08-05 DIAGNOSIS — L299 Pruritus, unspecified: Secondary | ICD-10-CM | POA: Diagnosis not present

## 2021-08-05 DIAGNOSIS — D689 Coagulation defect, unspecified: Secondary | ICD-10-CM | POA: Diagnosis not present

## 2021-08-05 DIAGNOSIS — N2581 Secondary hyperparathyroidism of renal origin: Secondary | ICD-10-CM | POA: Diagnosis not present

## 2021-08-05 DIAGNOSIS — N186 End stage renal disease: Secondary | ICD-10-CM | POA: Diagnosis not present

## 2021-08-05 DIAGNOSIS — D509 Iron deficiency anemia, unspecified: Secondary | ICD-10-CM | POA: Diagnosis not present

## 2021-08-08 ENCOUNTER — Ambulatory Visit (HOSPITAL_COMMUNITY)
Admission: RE | Admit: 2021-08-08 | Discharge: 2021-08-08 | Disposition: A | Discharge status: Home / Self Care | Payer: Medicare Other | Source: Ambulatory Visit | Attending: Nephrology | Admitting: Nephrology

## 2021-08-08 DIAGNOSIS — N2581 Secondary hyperparathyroidism of renal origin: Secondary | ICD-10-CM | POA: Diagnosis not present

## 2021-08-08 DIAGNOSIS — R188 Other ascites: Secondary | ICD-10-CM | POA: Insufficient documentation

## 2021-08-08 DIAGNOSIS — D689 Coagulation defect, unspecified: Secondary | ICD-10-CM | POA: Diagnosis not present

## 2021-08-08 DIAGNOSIS — N186 End stage renal disease: Secondary | ICD-10-CM | POA: Diagnosis not present

## 2021-08-08 DIAGNOSIS — L299 Pruritus, unspecified: Secondary | ICD-10-CM | POA: Diagnosis not present

## 2021-08-08 DIAGNOSIS — D509 Iron deficiency anemia, unspecified: Secondary | ICD-10-CM | POA: Diagnosis not present

## 2021-08-08 DIAGNOSIS — Z992 Dependence on renal dialysis: Secondary | ICD-10-CM | POA: Diagnosis not present

## 2021-08-08 HISTORY — PX: IR PARACENTESIS: IMG2679

## 2021-08-08 MED ORDER — LIDOCAINE HCL 1 % IJ SOLN
INTRAMUSCULAR | Status: AC
  Filled 2021-08-08: qty 20

## 2021-08-08 MED ORDER — LIDOCAINE HCL 1 % IJ SOLN
INTRAMUSCULAR | Status: DC | PRN
  Administered 2021-08-08: 10 mL

## 2021-08-08 MED ORDER — ALBUMIN HUMAN 25 % IV SOLN: 25.0000 g | Freq: Once | INTRAVENOUS | Status: AC

## 2021-08-08 MED ORDER — ALBUMIN HUMAN 25 % IV SOLN
INTRAVENOUS | Status: AC
  Administered 2021-08-08: 25 g via INTRAVENOUS
  Filled 2021-08-08: qty 100

## 2021-08-10 DIAGNOSIS — N2581 Secondary hyperparathyroidism of renal origin: Secondary | ICD-10-CM | POA: Diagnosis not present

## 2021-08-10 DIAGNOSIS — N186 End stage renal disease: Secondary | ICD-10-CM | POA: Diagnosis not present

## 2021-08-10 DIAGNOSIS — D509 Iron deficiency anemia, unspecified: Secondary | ICD-10-CM | POA: Diagnosis not present

## 2021-08-10 DIAGNOSIS — L299 Pruritus, unspecified: Secondary | ICD-10-CM | POA: Diagnosis not present

## 2021-08-10 DIAGNOSIS — D689 Coagulation defect, unspecified: Secondary | ICD-10-CM | POA: Diagnosis not present

## 2021-08-10 DIAGNOSIS — Z992 Dependence on renal dialysis: Secondary | ICD-10-CM | POA: Diagnosis not present

## 2021-08-13 DIAGNOSIS — N186 End stage renal disease: Secondary | ICD-10-CM | POA: Diagnosis not present

## 2021-08-13 DIAGNOSIS — Z992 Dependence on renal dialysis: Secondary | ICD-10-CM | POA: Diagnosis not present

## 2021-08-13 DIAGNOSIS — D689 Coagulation defect, unspecified: Secondary | ICD-10-CM | POA: Diagnosis not present

## 2021-08-13 DIAGNOSIS — N2581 Secondary hyperparathyroidism of renal origin: Secondary | ICD-10-CM | POA: Diagnosis not present

## 2021-08-15 DIAGNOSIS — N186 End stage renal disease: Secondary | ICD-10-CM | POA: Diagnosis not present

## 2021-08-15 DIAGNOSIS — L299 Pruritus, unspecified: Secondary | ICD-10-CM | POA: Diagnosis not present

## 2021-08-15 DIAGNOSIS — D509 Iron deficiency anemia, unspecified: Secondary | ICD-10-CM | POA: Diagnosis not present

## 2021-08-15 DIAGNOSIS — Z992 Dependence on renal dialysis: Secondary | ICD-10-CM | POA: Diagnosis not present

## 2021-08-15 DIAGNOSIS — D689 Coagulation defect, unspecified: Secondary | ICD-10-CM | POA: Diagnosis not present

## 2021-08-15 DIAGNOSIS — N2581 Secondary hyperparathyroidism of renal origin: Secondary | ICD-10-CM | POA: Diagnosis not present

## 2021-08-17 DIAGNOSIS — D509 Iron deficiency anemia, unspecified: Secondary | ICD-10-CM | POA: Diagnosis not present

## 2021-08-17 DIAGNOSIS — L299 Pruritus, unspecified: Secondary | ICD-10-CM | POA: Diagnosis not present

## 2021-08-17 DIAGNOSIS — N186 End stage renal disease: Secondary | ICD-10-CM | POA: Diagnosis not present

## 2021-08-17 DIAGNOSIS — D689 Coagulation defect, unspecified: Secondary | ICD-10-CM | POA: Diagnosis not present

## 2021-08-17 DIAGNOSIS — Z992 Dependence on renal dialysis: Secondary | ICD-10-CM | POA: Diagnosis not present

## 2021-08-17 DIAGNOSIS — N2581 Secondary hyperparathyroidism of renal origin: Secondary | ICD-10-CM | POA: Diagnosis not present

## 2021-08-18 DIAGNOSIS — Z01818 Encounter for other preprocedural examination: Secondary | ICD-10-CM | POA: Diagnosis not present

## 2021-08-18 DIAGNOSIS — I10 Essential (primary) hypertension: Secondary | ICD-10-CM | POA: Diagnosis not present

## 2021-08-18 DIAGNOSIS — N186 End stage renal disease: Secondary | ICD-10-CM | POA: Diagnosis not present

## 2021-08-18 DIAGNOSIS — Z992 Dependence on renal dialysis: Secondary | ICD-10-CM | POA: Diagnosis not present

## 2021-08-19 DIAGNOSIS — N186 End stage renal disease: Secondary | ICD-10-CM | POA: Diagnosis not present

## 2021-08-19 DIAGNOSIS — D509 Iron deficiency anemia, unspecified: Secondary | ICD-10-CM | POA: Diagnosis not present

## 2021-08-19 DIAGNOSIS — Z992 Dependence on renal dialysis: Secondary | ICD-10-CM | POA: Diagnosis not present

## 2021-08-19 DIAGNOSIS — L299 Pruritus, unspecified: Secondary | ICD-10-CM | POA: Diagnosis not present

## 2021-08-19 DIAGNOSIS — D689 Coagulation defect, unspecified: Secondary | ICD-10-CM | POA: Diagnosis not present

## 2021-08-19 DIAGNOSIS — N2581 Secondary hyperparathyroidism of renal origin: Secondary | ICD-10-CM | POA: Diagnosis not present

## 2021-08-22 DIAGNOSIS — D509 Iron deficiency anemia, unspecified: Secondary | ICD-10-CM | POA: Diagnosis not present

## 2021-08-22 DIAGNOSIS — D689 Coagulation defect, unspecified: Secondary | ICD-10-CM | POA: Diagnosis not present

## 2021-08-22 DIAGNOSIS — N2581 Secondary hyperparathyroidism of renal origin: Secondary | ICD-10-CM | POA: Diagnosis not present

## 2021-08-22 DIAGNOSIS — L299 Pruritus, unspecified: Secondary | ICD-10-CM | POA: Diagnosis not present

## 2021-08-22 DIAGNOSIS — N186 End stage renal disease: Secondary | ICD-10-CM | POA: Diagnosis not present

## 2021-08-22 DIAGNOSIS — Z992 Dependence on renal dialysis: Secondary | ICD-10-CM | POA: Diagnosis not present

## 2021-08-24 DIAGNOSIS — D689 Coagulation defect, unspecified: Secondary | ICD-10-CM | POA: Diagnosis not present

## 2021-08-24 DIAGNOSIS — N2581 Secondary hyperparathyroidism of renal origin: Secondary | ICD-10-CM | POA: Diagnosis not present

## 2021-08-24 DIAGNOSIS — N186 End stage renal disease: Secondary | ICD-10-CM | POA: Diagnosis not present

## 2021-08-24 DIAGNOSIS — D509 Iron deficiency anemia, unspecified: Secondary | ICD-10-CM | POA: Diagnosis not present

## 2021-08-24 DIAGNOSIS — L299 Pruritus, unspecified: Secondary | ICD-10-CM | POA: Diagnosis not present

## 2021-08-24 DIAGNOSIS — Z992 Dependence on renal dialysis: Secondary | ICD-10-CM | POA: Diagnosis not present

## 2021-08-26 DIAGNOSIS — N186 End stage renal disease: Secondary | ICD-10-CM | POA: Diagnosis not present

## 2021-08-26 DIAGNOSIS — D689 Coagulation defect, unspecified: Secondary | ICD-10-CM | POA: Diagnosis not present

## 2021-08-26 DIAGNOSIS — L299 Pruritus, unspecified: Secondary | ICD-10-CM | POA: Diagnosis not present

## 2021-08-26 DIAGNOSIS — Z992 Dependence on renal dialysis: Secondary | ICD-10-CM | POA: Diagnosis not present

## 2021-08-26 DIAGNOSIS — N2581 Secondary hyperparathyroidism of renal origin: Secondary | ICD-10-CM | POA: Diagnosis not present

## 2021-08-26 DIAGNOSIS — D509 Iron deficiency anemia, unspecified: Secondary | ICD-10-CM | POA: Diagnosis not present

## 2021-08-29 DIAGNOSIS — D689 Coagulation defect, unspecified: Secondary | ICD-10-CM | POA: Diagnosis not present

## 2021-08-29 DIAGNOSIS — L299 Pruritus, unspecified: Secondary | ICD-10-CM | POA: Diagnosis not present

## 2021-08-29 DIAGNOSIS — Z992 Dependence on renal dialysis: Secondary | ICD-10-CM | POA: Diagnosis not present

## 2021-08-29 DIAGNOSIS — D509 Iron deficiency anemia, unspecified: Secondary | ICD-10-CM | POA: Diagnosis not present

## 2021-08-29 DIAGNOSIS — N186 End stage renal disease: Secondary | ICD-10-CM | POA: Diagnosis not present

## 2021-08-29 DIAGNOSIS — N2581 Secondary hyperparathyroidism of renal origin: Secondary | ICD-10-CM | POA: Diagnosis not present

## 2021-08-31 DIAGNOSIS — L299 Pruritus, unspecified: Secondary | ICD-10-CM | POA: Diagnosis not present

## 2021-08-31 DIAGNOSIS — D509 Iron deficiency anemia, unspecified: Secondary | ICD-10-CM | POA: Diagnosis not present

## 2021-08-31 DIAGNOSIS — Z992 Dependence on renal dialysis: Secondary | ICD-10-CM | POA: Diagnosis not present

## 2021-08-31 DIAGNOSIS — N186 End stage renal disease: Secondary | ICD-10-CM | POA: Diagnosis not present

## 2021-08-31 DIAGNOSIS — N2581 Secondary hyperparathyroidism of renal origin: Secondary | ICD-10-CM | POA: Diagnosis not present

## 2021-08-31 DIAGNOSIS — D689 Coagulation defect, unspecified: Secondary | ICD-10-CM | POA: Diagnosis not present

## 2021-09-01 DIAGNOSIS — I129 Hypertensive chronic kidney disease with stage 1 through stage 4 chronic kidney disease, or unspecified chronic kidney disease: Secondary | ICD-10-CM | POA: Diagnosis not present

## 2021-09-01 DIAGNOSIS — I110 Hypertensive heart disease with heart failure: Secondary | ICD-10-CM | POA: Diagnosis not present

## 2021-09-01 DIAGNOSIS — N186 End stage renal disease: Secondary | ICD-10-CM | POA: Diagnosis not present

## 2021-09-01 DIAGNOSIS — I5020 Unspecified systolic (congestive) heart failure: Secondary | ICD-10-CM | POA: Diagnosis not present

## 2021-09-01 DIAGNOSIS — Z992 Dependence on renal dialysis: Secondary | ICD-10-CM | POA: Diagnosis not present

## 2021-09-02 DIAGNOSIS — Z992 Dependence on renal dialysis: Secondary | ICD-10-CM | POA: Diagnosis not present

## 2021-09-02 DIAGNOSIS — L299 Pruritus, unspecified: Secondary | ICD-10-CM | POA: Diagnosis not present

## 2021-09-02 DIAGNOSIS — D689 Coagulation defect, unspecified: Secondary | ICD-10-CM | POA: Diagnosis not present

## 2021-09-02 DIAGNOSIS — D509 Iron deficiency anemia, unspecified: Secondary | ICD-10-CM | POA: Diagnosis not present

## 2021-09-02 DIAGNOSIS — N186 End stage renal disease: Secondary | ICD-10-CM | POA: Diagnosis not present

## 2021-09-02 DIAGNOSIS — D631 Anemia in chronic kidney disease: Secondary | ICD-10-CM | POA: Diagnosis not present

## 2021-09-02 DIAGNOSIS — N2581 Secondary hyperparathyroidism of renal origin: Secondary | ICD-10-CM | POA: Diagnosis not present

## 2021-09-05 DIAGNOSIS — N2581 Secondary hyperparathyroidism of renal origin: Secondary | ICD-10-CM | POA: Diagnosis not present

## 2021-09-05 DIAGNOSIS — N186 End stage renal disease: Secondary | ICD-10-CM | POA: Diagnosis not present

## 2021-09-05 DIAGNOSIS — D689 Coagulation defect, unspecified: Secondary | ICD-10-CM | POA: Diagnosis not present

## 2021-09-05 DIAGNOSIS — D631 Anemia in chronic kidney disease: Secondary | ICD-10-CM | POA: Diagnosis not present

## 2021-09-05 DIAGNOSIS — Z992 Dependence on renal dialysis: Secondary | ICD-10-CM | POA: Diagnosis not present

## 2021-09-05 DIAGNOSIS — D509 Iron deficiency anemia, unspecified: Secondary | ICD-10-CM | POA: Diagnosis not present

## 2021-09-05 DIAGNOSIS — L299 Pruritus, unspecified: Secondary | ICD-10-CM | POA: Diagnosis not present

## 2021-09-07 DIAGNOSIS — N186 End stage renal disease: Secondary | ICD-10-CM | POA: Diagnosis not present

## 2021-09-07 DIAGNOSIS — D509 Iron deficiency anemia, unspecified: Secondary | ICD-10-CM | POA: Diagnosis not present

## 2021-09-07 DIAGNOSIS — D631 Anemia in chronic kidney disease: Secondary | ICD-10-CM | POA: Diagnosis not present

## 2021-09-07 DIAGNOSIS — N2581 Secondary hyperparathyroidism of renal origin: Secondary | ICD-10-CM | POA: Diagnosis not present

## 2021-09-07 DIAGNOSIS — Z992 Dependence on renal dialysis: Secondary | ICD-10-CM | POA: Diagnosis not present

## 2021-09-07 DIAGNOSIS — D689 Coagulation defect, unspecified: Secondary | ICD-10-CM | POA: Diagnosis not present

## 2021-09-07 DIAGNOSIS — L299 Pruritus, unspecified: Secondary | ICD-10-CM | POA: Diagnosis not present

## 2021-09-12 DIAGNOSIS — Z992 Dependence on renal dialysis: Secondary | ICD-10-CM | POA: Diagnosis not present

## 2021-09-12 DIAGNOSIS — N186 End stage renal disease: Secondary | ICD-10-CM | POA: Diagnosis not present

## 2021-09-12 DIAGNOSIS — D689 Coagulation defect, unspecified: Secondary | ICD-10-CM | POA: Diagnosis not present

## 2021-09-12 DIAGNOSIS — D509 Iron deficiency anemia, unspecified: Secondary | ICD-10-CM | POA: Diagnosis not present

## 2021-09-12 DIAGNOSIS — D631 Anemia in chronic kidney disease: Secondary | ICD-10-CM | POA: Diagnosis not present

## 2021-09-12 DIAGNOSIS — N2581 Secondary hyperparathyroidism of renal origin: Secondary | ICD-10-CM | POA: Diagnosis not present

## 2021-09-12 DIAGNOSIS — L299 Pruritus, unspecified: Secondary | ICD-10-CM | POA: Diagnosis not present

## 2021-09-14 DIAGNOSIS — L299 Pruritus, unspecified: Secondary | ICD-10-CM | POA: Diagnosis not present

## 2021-09-14 DIAGNOSIS — D509 Iron deficiency anemia, unspecified: Secondary | ICD-10-CM | POA: Diagnosis not present

## 2021-09-14 DIAGNOSIS — N2581 Secondary hyperparathyroidism of renal origin: Secondary | ICD-10-CM | POA: Diagnosis not present

## 2021-09-14 DIAGNOSIS — D689 Coagulation defect, unspecified: Secondary | ICD-10-CM | POA: Diagnosis not present

## 2021-09-14 DIAGNOSIS — Z992 Dependence on renal dialysis: Secondary | ICD-10-CM | POA: Diagnosis not present

## 2021-09-14 DIAGNOSIS — N186 End stage renal disease: Secondary | ICD-10-CM | POA: Diagnosis not present

## 2021-09-14 DIAGNOSIS — D631 Anemia in chronic kidney disease: Secondary | ICD-10-CM | POA: Diagnosis not present

## 2021-09-16 DIAGNOSIS — D631 Anemia in chronic kidney disease: Secondary | ICD-10-CM | POA: Diagnosis not present

## 2021-09-16 DIAGNOSIS — L299 Pruritus, unspecified: Secondary | ICD-10-CM | POA: Diagnosis not present

## 2021-09-16 DIAGNOSIS — D689 Coagulation defect, unspecified: Secondary | ICD-10-CM | POA: Diagnosis not present

## 2021-09-16 DIAGNOSIS — N2581 Secondary hyperparathyroidism of renal origin: Secondary | ICD-10-CM | POA: Diagnosis not present

## 2021-09-16 DIAGNOSIS — Z992 Dependence on renal dialysis: Secondary | ICD-10-CM | POA: Diagnosis not present

## 2021-09-16 DIAGNOSIS — N186 End stage renal disease: Secondary | ICD-10-CM | POA: Diagnosis not present

## 2021-09-16 DIAGNOSIS — D509 Iron deficiency anemia, unspecified: Secondary | ICD-10-CM | POA: Diagnosis not present

## 2021-09-19 DIAGNOSIS — Z992 Dependence on renal dialysis: Secondary | ICD-10-CM | POA: Diagnosis not present

## 2021-09-19 DIAGNOSIS — D689 Coagulation defect, unspecified: Secondary | ICD-10-CM | POA: Diagnosis not present

## 2021-09-19 DIAGNOSIS — D631 Anemia in chronic kidney disease: Secondary | ICD-10-CM | POA: Diagnosis not present

## 2021-09-19 DIAGNOSIS — N2581 Secondary hyperparathyroidism of renal origin: Secondary | ICD-10-CM | POA: Diagnosis not present

## 2021-09-19 DIAGNOSIS — L299 Pruritus, unspecified: Secondary | ICD-10-CM | POA: Diagnosis not present

## 2021-09-19 DIAGNOSIS — N186 End stage renal disease: Secondary | ICD-10-CM | POA: Diagnosis not present

## 2021-09-19 DIAGNOSIS — D509 Iron deficiency anemia, unspecified: Secondary | ICD-10-CM | POA: Diagnosis not present

## 2021-09-21 DIAGNOSIS — D631 Anemia in chronic kidney disease: Secondary | ICD-10-CM | POA: Diagnosis not present

## 2021-09-21 DIAGNOSIS — L299 Pruritus, unspecified: Secondary | ICD-10-CM | POA: Diagnosis not present

## 2021-09-21 DIAGNOSIS — D689 Coagulation defect, unspecified: Secondary | ICD-10-CM | POA: Diagnosis not present

## 2021-09-21 DIAGNOSIS — N2581 Secondary hyperparathyroidism of renal origin: Secondary | ICD-10-CM | POA: Diagnosis not present

## 2021-09-21 DIAGNOSIS — Z992 Dependence on renal dialysis: Secondary | ICD-10-CM | POA: Diagnosis not present

## 2021-09-21 DIAGNOSIS — N186 End stage renal disease: Secondary | ICD-10-CM | POA: Diagnosis not present

## 2021-09-21 DIAGNOSIS — D509 Iron deficiency anemia, unspecified: Secondary | ICD-10-CM | POA: Diagnosis not present

## 2021-09-23 DIAGNOSIS — D689 Coagulation defect, unspecified: Secondary | ICD-10-CM | POA: Diagnosis not present

## 2021-09-23 DIAGNOSIS — N2581 Secondary hyperparathyroidism of renal origin: Secondary | ICD-10-CM | POA: Diagnosis not present

## 2021-09-23 DIAGNOSIS — D509 Iron deficiency anemia, unspecified: Secondary | ICD-10-CM | POA: Diagnosis not present

## 2021-09-23 DIAGNOSIS — L299 Pruritus, unspecified: Secondary | ICD-10-CM | POA: Diagnosis not present

## 2021-09-23 DIAGNOSIS — Z992 Dependence on renal dialysis: Secondary | ICD-10-CM | POA: Diagnosis not present

## 2021-09-23 DIAGNOSIS — D631 Anemia in chronic kidney disease: Secondary | ICD-10-CM | POA: Diagnosis not present

## 2021-09-23 DIAGNOSIS — N186 End stage renal disease: Secondary | ICD-10-CM | POA: Diagnosis not present

## 2021-09-24 DIAGNOSIS — E785 Hyperlipidemia, unspecified: Secondary | ICD-10-CM | POA: Diagnosis not present

## 2021-09-24 DIAGNOSIS — M109 Gout, unspecified: Secondary | ICD-10-CM | POA: Diagnosis not present

## 2021-09-24 DIAGNOSIS — J45909 Unspecified asthma, uncomplicated: Secondary | ICD-10-CM | POA: Diagnosis not present

## 2021-09-24 DIAGNOSIS — I5020 Unspecified systolic (congestive) heart failure: Secondary | ICD-10-CM | POA: Diagnosis not present

## 2021-09-24 DIAGNOSIS — Z992 Dependence on renal dialysis: Secondary | ICD-10-CM | POA: Diagnosis not present

## 2021-09-24 DIAGNOSIS — N186 End stage renal disease: Secondary | ICD-10-CM | POA: Diagnosis not present

## 2021-09-24 DIAGNOSIS — L309 Dermatitis, unspecified: Secondary | ICD-10-CM | POA: Diagnosis not present

## 2021-09-24 DIAGNOSIS — I110 Hypertensive heart disease with heart failure: Secondary | ICD-10-CM | POA: Diagnosis not present

## 2021-09-26 DIAGNOSIS — D689 Coagulation defect, unspecified: Secondary | ICD-10-CM | POA: Diagnosis not present

## 2021-09-26 DIAGNOSIS — Z992 Dependence on renal dialysis: Secondary | ICD-10-CM | POA: Diagnosis not present

## 2021-09-26 DIAGNOSIS — L299 Pruritus, unspecified: Secondary | ICD-10-CM | POA: Diagnosis not present

## 2021-09-26 DIAGNOSIS — D509 Iron deficiency anemia, unspecified: Secondary | ICD-10-CM | POA: Diagnosis not present

## 2021-09-26 DIAGNOSIS — N2581 Secondary hyperparathyroidism of renal origin: Secondary | ICD-10-CM | POA: Diagnosis not present

## 2021-09-26 DIAGNOSIS — N186 End stage renal disease: Secondary | ICD-10-CM | POA: Diagnosis not present

## 2021-09-26 DIAGNOSIS — D631 Anemia in chronic kidney disease: Secondary | ICD-10-CM | POA: Diagnosis not present

## 2021-09-28 DIAGNOSIS — N186 End stage renal disease: Secondary | ICD-10-CM | POA: Diagnosis not present

## 2021-09-28 DIAGNOSIS — N2581 Secondary hyperparathyroidism of renal origin: Secondary | ICD-10-CM | POA: Diagnosis not present

## 2021-09-28 DIAGNOSIS — D689 Coagulation defect, unspecified: Secondary | ICD-10-CM | POA: Diagnosis not present

## 2021-09-28 DIAGNOSIS — D631 Anemia in chronic kidney disease: Secondary | ICD-10-CM | POA: Diagnosis not present

## 2021-09-28 DIAGNOSIS — Z992 Dependence on renal dialysis: Secondary | ICD-10-CM | POA: Diagnosis not present

## 2021-09-28 DIAGNOSIS — D509 Iron deficiency anemia, unspecified: Secondary | ICD-10-CM | POA: Diagnosis not present

## 2021-09-28 DIAGNOSIS — L299 Pruritus, unspecified: Secondary | ICD-10-CM | POA: Diagnosis not present

## 2021-09-30 DIAGNOSIS — D631 Anemia in chronic kidney disease: Secondary | ICD-10-CM | POA: Diagnosis not present

## 2021-09-30 DIAGNOSIS — D689 Coagulation defect, unspecified: Secondary | ICD-10-CM | POA: Diagnosis not present

## 2021-09-30 DIAGNOSIS — Z992 Dependence on renal dialysis: Secondary | ICD-10-CM | POA: Diagnosis not present

## 2021-09-30 DIAGNOSIS — D509 Iron deficiency anemia, unspecified: Secondary | ICD-10-CM | POA: Diagnosis not present

## 2021-09-30 DIAGNOSIS — N2581 Secondary hyperparathyroidism of renal origin: Secondary | ICD-10-CM | POA: Diagnosis not present

## 2021-09-30 DIAGNOSIS — N186 End stage renal disease: Secondary | ICD-10-CM | POA: Diagnosis not present

## 2021-09-30 DIAGNOSIS — L299 Pruritus, unspecified: Secondary | ICD-10-CM | POA: Diagnosis not present

## 2021-10-01 DIAGNOSIS — N186 End stage renal disease: Secondary | ICD-10-CM | POA: Diagnosis not present

## 2021-10-01 DIAGNOSIS — I129 Hypertensive chronic kidney disease with stage 1 through stage 4 chronic kidney disease, or unspecified chronic kidney disease: Secondary | ICD-10-CM | POA: Diagnosis not present

## 2021-10-01 DIAGNOSIS — Z992 Dependence on renal dialysis: Secondary | ICD-10-CM | POA: Diagnosis not present

## 2021-10-03 DIAGNOSIS — N2581 Secondary hyperparathyroidism of renal origin: Secondary | ICD-10-CM | POA: Diagnosis not present

## 2021-10-03 DIAGNOSIS — L299 Pruritus, unspecified: Secondary | ICD-10-CM | POA: Diagnosis not present

## 2021-10-03 DIAGNOSIS — D509 Iron deficiency anemia, unspecified: Secondary | ICD-10-CM | POA: Diagnosis not present

## 2021-10-03 DIAGNOSIS — Z992 Dependence on renal dialysis: Secondary | ICD-10-CM | POA: Diagnosis not present

## 2021-10-03 DIAGNOSIS — N186 End stage renal disease: Secondary | ICD-10-CM | POA: Diagnosis not present

## 2021-10-03 DIAGNOSIS — D689 Coagulation defect, unspecified: Secondary | ICD-10-CM | POA: Diagnosis not present

## 2021-10-03 DIAGNOSIS — Z7682 Awaiting organ transplant status: Secondary | ICD-10-CM | POA: Diagnosis not present

## 2021-10-03 DIAGNOSIS — Z23 Encounter for immunization: Secondary | ICD-10-CM | POA: Diagnosis not present

## 2021-10-05 DIAGNOSIS — D689 Coagulation defect, unspecified: Secondary | ICD-10-CM | POA: Diagnosis not present

## 2021-10-05 DIAGNOSIS — L299 Pruritus, unspecified: Secondary | ICD-10-CM | POA: Diagnosis not present

## 2021-10-05 DIAGNOSIS — N186 End stage renal disease: Secondary | ICD-10-CM | POA: Diagnosis not present

## 2021-10-05 DIAGNOSIS — Z23 Encounter for immunization: Secondary | ICD-10-CM | POA: Diagnosis not present

## 2021-10-05 DIAGNOSIS — N2581 Secondary hyperparathyroidism of renal origin: Secondary | ICD-10-CM | POA: Diagnosis not present

## 2021-10-05 DIAGNOSIS — D509 Iron deficiency anemia, unspecified: Secondary | ICD-10-CM | POA: Diagnosis not present

## 2021-10-05 DIAGNOSIS — Z992 Dependence on renal dialysis: Secondary | ICD-10-CM | POA: Diagnosis not present

## 2021-10-08 DIAGNOSIS — D689 Coagulation defect, unspecified: Secondary | ICD-10-CM | POA: Diagnosis not present

## 2021-10-08 DIAGNOSIS — N2581 Secondary hyperparathyroidism of renal origin: Secondary | ICD-10-CM | POA: Diagnosis not present

## 2021-10-08 DIAGNOSIS — Z992 Dependence on renal dialysis: Secondary | ICD-10-CM | POA: Diagnosis not present

## 2021-10-08 DIAGNOSIS — N186 End stage renal disease: Secondary | ICD-10-CM | POA: Diagnosis not present

## 2021-10-10 DIAGNOSIS — D509 Iron deficiency anemia, unspecified: Secondary | ICD-10-CM | POA: Diagnosis not present

## 2021-10-10 DIAGNOSIS — L299 Pruritus, unspecified: Secondary | ICD-10-CM | POA: Diagnosis not present

## 2021-10-10 DIAGNOSIS — D689 Coagulation defect, unspecified: Secondary | ICD-10-CM | POA: Diagnosis not present

## 2021-10-10 DIAGNOSIS — N2581 Secondary hyperparathyroidism of renal origin: Secondary | ICD-10-CM | POA: Diagnosis not present

## 2021-10-10 DIAGNOSIS — N186 End stage renal disease: Secondary | ICD-10-CM | POA: Diagnosis not present

## 2021-10-10 DIAGNOSIS — Z992 Dependence on renal dialysis: Secondary | ICD-10-CM | POA: Diagnosis not present

## 2021-10-10 DIAGNOSIS — Z23 Encounter for immunization: Secondary | ICD-10-CM | POA: Diagnosis not present

## 2021-10-12 DIAGNOSIS — N186 End stage renal disease: Secondary | ICD-10-CM | POA: Diagnosis not present

## 2021-10-12 DIAGNOSIS — N2581 Secondary hyperparathyroidism of renal origin: Secondary | ICD-10-CM | POA: Diagnosis not present

## 2021-10-12 DIAGNOSIS — L299 Pruritus, unspecified: Secondary | ICD-10-CM | POA: Diagnosis not present

## 2021-10-12 DIAGNOSIS — Z992 Dependence on renal dialysis: Secondary | ICD-10-CM | POA: Diagnosis not present

## 2021-10-12 DIAGNOSIS — Z23 Encounter for immunization: Secondary | ICD-10-CM | POA: Diagnosis not present

## 2021-10-12 DIAGNOSIS — D509 Iron deficiency anemia, unspecified: Secondary | ICD-10-CM | POA: Diagnosis not present

## 2021-10-12 DIAGNOSIS — D689 Coagulation defect, unspecified: Secondary | ICD-10-CM | POA: Diagnosis not present

## 2021-10-13 ENCOUNTER — Other Ambulatory Visit (HOSPITAL_COMMUNITY): Payer: Medicare Other

## 2021-10-14 ENCOUNTER — Ambulatory Visit (HOSPITAL_COMMUNITY)
Admission: RE | Admit: 2021-10-14 | Discharge: 2021-10-14 | Disposition: A | Discharge status: Home / Self Care | Payer: Medicare Other | Source: Ambulatory Visit | Attending: Nephrology | Admitting: Nephrology

## 2021-10-14 DIAGNOSIS — R188 Other ascites: Secondary | ICD-10-CM | POA: Insufficient documentation

## 2021-10-14 DIAGNOSIS — D689 Coagulation defect, unspecified: Secondary | ICD-10-CM | POA: Diagnosis not present

## 2021-10-14 DIAGNOSIS — Z992 Dependence on renal dialysis: Secondary | ICD-10-CM | POA: Diagnosis not present

## 2021-10-14 DIAGNOSIS — N2581 Secondary hyperparathyroidism of renal origin: Secondary | ICD-10-CM | POA: Diagnosis not present

## 2021-10-14 DIAGNOSIS — L299 Pruritus, unspecified: Secondary | ICD-10-CM | POA: Diagnosis not present

## 2021-10-14 DIAGNOSIS — D509 Iron deficiency anemia, unspecified: Secondary | ICD-10-CM | POA: Diagnosis not present

## 2021-10-14 DIAGNOSIS — N186 End stage renal disease: Secondary | ICD-10-CM | POA: Diagnosis not present

## 2021-10-14 DIAGNOSIS — Z23 Encounter for immunization: Secondary | ICD-10-CM | POA: Diagnosis not present

## 2021-10-14 HISTORY — PX: IR PARACENTESIS: IMG2679

## 2021-10-14 MED ORDER — LIDOCAINE HCL 1 % IJ SOLN
INTRAMUSCULAR | Status: AC
  Filled 2021-10-14: qty 20

## 2021-10-14 MED ORDER — LIDOCAINE HCL (PF) 1 % IJ SOLN
INTRAMUSCULAR | Status: DC | PRN
  Administered 2021-10-14: 10 mL

## 2021-10-14 NOTE — Procedures (Signed)
PROCEDURE SUMMARY:  Successful US guided paracentesis from left lower quadrant.  Yielded 3.5 L of clear yellow fluid.  No immediate complications.  Pt tolerated well.   EBL < 2 mL  Theresa Duty, NP 10/14/2021 10:06 AM

## 2021-10-17 DIAGNOSIS — L299 Pruritus, unspecified: Secondary | ICD-10-CM | POA: Diagnosis not present

## 2021-10-17 DIAGNOSIS — N2581 Secondary hyperparathyroidism of renal origin: Secondary | ICD-10-CM | POA: Diagnosis not present

## 2021-10-17 DIAGNOSIS — D689 Coagulation defect, unspecified: Secondary | ICD-10-CM | POA: Diagnosis not present

## 2021-10-17 DIAGNOSIS — Z992 Dependence on renal dialysis: Secondary | ICD-10-CM | POA: Diagnosis not present

## 2021-10-17 DIAGNOSIS — D509 Iron deficiency anemia, unspecified: Secondary | ICD-10-CM | POA: Diagnosis not present

## 2021-10-17 DIAGNOSIS — N186 End stage renal disease: Secondary | ICD-10-CM | POA: Diagnosis not present

## 2021-10-17 DIAGNOSIS — Z23 Encounter for immunization: Secondary | ICD-10-CM | POA: Diagnosis not present

## 2021-10-19 DIAGNOSIS — D509 Iron deficiency anemia, unspecified: Secondary | ICD-10-CM | POA: Diagnosis not present

## 2021-10-19 DIAGNOSIS — Z992 Dependence on renal dialysis: Secondary | ICD-10-CM | POA: Diagnosis not present

## 2021-10-19 DIAGNOSIS — L299 Pruritus, unspecified: Secondary | ICD-10-CM | POA: Diagnosis not present

## 2021-10-19 DIAGNOSIS — Z23 Encounter for immunization: Secondary | ICD-10-CM | POA: Diagnosis not present

## 2021-10-19 DIAGNOSIS — D689 Coagulation defect, unspecified: Secondary | ICD-10-CM | POA: Diagnosis not present

## 2021-10-19 DIAGNOSIS — N2581 Secondary hyperparathyroidism of renal origin: Secondary | ICD-10-CM | POA: Diagnosis not present

## 2021-10-19 DIAGNOSIS — N186 End stage renal disease: Secondary | ICD-10-CM | POA: Diagnosis not present

## 2021-10-20 DIAGNOSIS — K4090 Unilateral inguinal hernia, without obstruction or gangrene, not specified as recurrent: Secondary | ICD-10-CM | POA: Diagnosis not present

## 2021-10-20 DIAGNOSIS — N138 Other obstructive and reflux uropathy: Secondary | ICD-10-CM | POA: Diagnosis not present

## 2021-10-21 DIAGNOSIS — N186 End stage renal disease: Secondary | ICD-10-CM | POA: Diagnosis not present

## 2021-10-21 DIAGNOSIS — L299 Pruritus, unspecified: Secondary | ICD-10-CM | POA: Diagnosis not present

## 2021-10-21 DIAGNOSIS — Z23 Encounter for immunization: Secondary | ICD-10-CM | POA: Diagnosis not present

## 2021-10-21 DIAGNOSIS — Z992 Dependence on renal dialysis: Secondary | ICD-10-CM | POA: Diagnosis not present

## 2021-10-21 DIAGNOSIS — D509 Iron deficiency anemia, unspecified: Secondary | ICD-10-CM | POA: Diagnosis not present

## 2021-10-21 DIAGNOSIS — N2581 Secondary hyperparathyroidism of renal origin: Secondary | ICD-10-CM | POA: Diagnosis not present

## 2021-10-21 DIAGNOSIS — D689 Coagulation defect, unspecified: Secondary | ICD-10-CM | POA: Diagnosis not present

## 2021-10-24 DIAGNOSIS — N186 End stage renal disease: Secondary | ICD-10-CM | POA: Diagnosis not present

## 2021-10-24 DIAGNOSIS — D509 Iron deficiency anemia, unspecified: Secondary | ICD-10-CM | POA: Diagnosis not present

## 2021-10-24 DIAGNOSIS — Z992 Dependence on renal dialysis: Secondary | ICD-10-CM | POA: Diagnosis not present

## 2021-10-24 DIAGNOSIS — N2581 Secondary hyperparathyroidism of renal origin: Secondary | ICD-10-CM | POA: Diagnosis not present

## 2021-10-24 DIAGNOSIS — L299 Pruritus, unspecified: Secondary | ICD-10-CM | POA: Diagnosis not present

## 2021-10-24 DIAGNOSIS — D689 Coagulation defect, unspecified: Secondary | ICD-10-CM | POA: Diagnosis not present

## 2021-10-24 DIAGNOSIS — Z23 Encounter for immunization: Secondary | ICD-10-CM | POA: Diagnosis not present

## 2021-10-26 DIAGNOSIS — D509 Iron deficiency anemia, unspecified: Secondary | ICD-10-CM | POA: Diagnosis not present

## 2021-10-26 DIAGNOSIS — D689 Coagulation defect, unspecified: Secondary | ICD-10-CM | POA: Diagnosis not present

## 2021-10-26 DIAGNOSIS — Z992 Dependence on renal dialysis: Secondary | ICD-10-CM | POA: Diagnosis not present

## 2021-10-26 DIAGNOSIS — N2581 Secondary hyperparathyroidism of renal origin: Secondary | ICD-10-CM | POA: Diagnosis not present

## 2021-10-26 DIAGNOSIS — N186 End stage renal disease: Secondary | ICD-10-CM | POA: Diagnosis not present

## 2021-10-26 DIAGNOSIS — Z23 Encounter for immunization: Secondary | ICD-10-CM | POA: Diagnosis not present

## 2021-10-26 DIAGNOSIS — L299 Pruritus, unspecified: Secondary | ICD-10-CM | POA: Diagnosis not present

## 2021-10-28 DIAGNOSIS — Z23 Encounter for immunization: Secondary | ICD-10-CM | POA: Diagnosis not present

## 2021-10-28 DIAGNOSIS — D689 Coagulation defect, unspecified: Secondary | ICD-10-CM | POA: Diagnosis not present

## 2021-10-28 DIAGNOSIS — N2581 Secondary hyperparathyroidism of renal origin: Secondary | ICD-10-CM | POA: Diagnosis not present

## 2021-10-28 DIAGNOSIS — D509 Iron deficiency anemia, unspecified: Secondary | ICD-10-CM | POA: Diagnosis not present

## 2021-10-28 DIAGNOSIS — Z992 Dependence on renal dialysis: Secondary | ICD-10-CM | POA: Diagnosis not present

## 2021-10-28 DIAGNOSIS — N186 End stage renal disease: Secondary | ICD-10-CM | POA: Diagnosis not present

## 2021-10-28 DIAGNOSIS — L299 Pruritus, unspecified: Secondary | ICD-10-CM | POA: Diagnosis not present

## 2021-10-29 ENCOUNTER — Ambulatory Visit
Admission: EM | Admit: 2021-10-29 | Discharge: 2021-10-29 | Disposition: A | Discharge status: Home / Self Care | Payer: Medicare Other | Attending: Urgent Care | Admitting: Urgent Care

## 2021-10-29 ENCOUNTER — Encounter: Payer: Self-pay | Admitting: *Deleted

## 2021-10-29 DIAGNOSIS — Z992 Dependence on renal dialysis: Secondary | ICD-10-CM | POA: Diagnosis not present

## 2021-10-29 DIAGNOSIS — M436 Torticollis: Secondary | ICD-10-CM | POA: Diagnosis not present

## 2021-10-29 DIAGNOSIS — N186 End stage renal disease: Secondary | ICD-10-CM

## 2021-10-29 MED ORDER — METHOCARBAMOL 500 MG PO TABS: 500.0000 mg | ORAL_TABLET | Freq: Two times a day (BID) | ORAL | 0 refills | Status: DC

## 2021-10-29 MED ORDER — PREDNISONE 10 MG PO TABS: 30.0000 mg | ORAL_TABLET | Freq: Every day | ORAL | 0 refills | Status: DC

## 2021-10-29 NOTE — ED Provider Notes (Signed)
[facility] - URGENT [facility]  Note:  This document was prepared using Systems analyst and may include unintentional dictation errors.  MRN: [id number] DOB: 06/30/1965  Subjective:   Vernon Blackburn is a 56 y.o. male with ESRD on dialysis presenting for 1 day history of acute onset persistent left-sided neck pain along the left paraspinal muscles extending slightly to the trapezius.  Has had associated neck stiffness and difficulty rotating his head to either side.  Reports that he woke up with the symptoms.  Sleeps on his right side generally speaking.  No fever, headache, weakness, confusion, vision changes, runny or stuffy nose, ear pain, numbness or tingling, radicular symptoms.  Patient has past medical history of chronic systolic heart failure.  Last echocardiogram was done in 2022, showed an ejection fraction of 50 to 55%.  No current facility-administered medications for this encounter.  Current Outpatient Medications:    Albuterol Sulfate (PROAIR RESPICLICK) 751 (90 Base) MCG/ACT AEPB, Inhale 2 puffs into the lungs every 6 (six) hours as needed (wheezing/shortness of breath)., Disp: , Rfl:    aspirin EC 81 MG tablet, Take 81 mg by mouth daily. Swallow whole., Disp: , Rfl:    B Complex-C-Zn-Folic Acid (DIALYVITE 025 WITH ZINC) 0.8 MG TABS, Take 1 tablet by mouth daily with supper., Disp: , Rfl:    calcium acetate (PHOSLO) 667 MG capsule, Take 2,668 mg by mouth 3 (three) times daily with meals., Disp: , Rfl:    carvedilol (COREG) 3.125 MG tablet, Take 1 tablet (3.125 mg total) by mouth 2 (two) times daily with a meal. (Patient taking differently: Take 3.125 mg by mouth every evening.), Disp: 180 tablet, Rfl: 3   Febuxostat 80 MG TABS, Take 80 mg by mouth in the morning., Disp: , Rfl:    furosemide (LASIX) 80 MG tablet, Take 80 mg by mouth 2 (two) times daily., Disp: , Rfl:    lactulose (CHRONULAC) 10 GM/15ML solution, Take 10 g by mouth 2 (two) times daily as  needed for mild constipation., Disp: , Rfl:    lidocaine-prilocaine (EMLA) cream, Apply 1 application topically daily as needed (prior to port being access)., Disp: , Rfl:    midodrine (PROAMATINE) 10 MG tablet, Take 10 mg by mouth every Monday, Wednesday, and Friday with hemodialysis., Disp: , Rfl:    polyethylene glycol (MIRALAX / GLYCOLAX) 17 g packet, Take 17 g by mouth every other day., Disp: , Rfl:    tamsulosin (FLOMAX) 0.4 MG CAPS capsule, Take 0.4 mg by mouth in the morning., Disp: , Rfl:    VYVANSE 30 MG capsule, Take 30 mg by mouth every morning., Disp: , Rfl:    Allergies  Allergen Reactions   Isosorbide     Hypotension     Past Medical History:  Diagnosis Date   ADHD    Anemia, chronic renal failure    Asthma    followed by pcp   Benign localized prostatic hyperplasia with lower urinary tract symptoms (LUTS)    Bladder stones    BPH (benign prostatic hyperplasia)    Chronic gout    05-06-2020  per pt last episode 6 months ago,  (involves right knee and right great toe)   Chronic systolic HF (heart failure) ([place])    followed by cardiology, dr Aundra Dubin @ [facility],   last TEE 03-06-2019 in epic ef 45%   Dyspnea    ESRD on hemodialysis [facility])    primary nephrologist--- dr Moshe Cipro--- HD on M/W/ F  at  [facility] in [place] murmur    History of bladder stone    History of cellulitis 01/27/2020   ED visit ,  abdominal wall   History of CVA (cerebrovascular accident) without residual deficits    remote infarct per imaging MRI brain in care every where 02-07-2019   History of kidney stones    History of obstructive sleep apnea    05-06-2020  per pt test positive for osa prior to gastric bypass in 2014 used cpap,  pt stated was retested after alot of weight loss , test was negative    Hypertension    followed by cardiology   NICM (nonischemic cardiomyopathy) ([place])    followed by cardiology---- 01/ 2018  ef 20-35%, severe MV stenosis;   last TEE echo in epic 03-06-2019  ef 45%   Peripheral edema    Bilateral lower extremity    S/P minimally invasive mitral valve repair followed by cardiology   06-29-2016  for severe stenosis ;  30 mm [name] via right mini thoracotomy approach;  last TEE echo in epic 03-06-2019 ef 45%, mild LVH, post op mild MR with mean gradient 38mHg and no significant stenosis   Seasonal and perennial allergic rhinitis    Sleep apnea      Past Surgical History:  Procedure Laterality Date   AV FISTULA PLACEMENT Left 03/10/2019   Procedure: LEFT ARM BASILIC ARTERIOVENOUS (AV) FISTULA CREATION FIRST STAGE;  Surgeon: CMarty Heck MD;  [facility]  Service: Vascular;  Laterality: Left;   [place]   Procedure: LEFT SECOND STAGE [place];  Surgeon: CMarty Heck MD;  [place] [place];  Service: Vascular;  Laterality: Left;   BIOPSY  03/09/2021   Procedure: BIOPSY;  Surgeon: CLavena Bullion DO;  [facility];  Service: Gastroenterology;;   COLONOSCOPY WITH PROPOFOL N/A 03/09/2021   Procedure: COLONOSCOPY WITH PROPOFOL;  Surgeon: CLavena Bullion DO;  [facility]: [facility];  Service: Gastroenterology;  Laterality: N/A;   CYSTOSCOPY/URETEROSCOPY/HOLMIUM LASER/STENT PLACEMENT Right 07/17/2018   Procedure: CYSTOSCOPY/RETROGRADE/URETEROSCOPY/HOLMIUM LASER/STENT PLACEMENT/ CYSTOLITHALOPAXY;  Surgeon: WCeasar Mons MD;  Location: [facility];  Service: Urology;  Laterality: Right;   GASTRIC BYPASS  01/2012   IR CATHETER TUBE CHANGE  08/05/2020   IR CATHETER TUBE CHANGE  09/07/2020   IR CATHETER TUBE CHANGE  10/05/2020   IR PARACENTESIS  07/13/2020   IR PARACENTESIS  09/07/2020   IR PARACENTESIS  09/15/2020   IR PARACENTESIS  10/11/2020   IR PARACENTESIS  10/28/2020   IR PARACENTESIS  11/16/2020   IR PARACENTESIS  12/07/2020   IR PARACENTESIS  12/21/2020   IR PARACENTESIS  01/10/2021   IR  PARACENTESIS  01/27/2021   IR PARACENTESIS  02/11/2021   IR PARACENTESIS  03/01/2021   IR PARACENTESIS  03/24/2021   IR PARACENTESIS  04/08/2021   IR PARACENTESIS  04/25/2021   IR PARACENTESIS  05/24/2021   IR PARACENTESIS  06/21/2021   IR PARACENTESIS  08/08/2021   IR PARACENTESIS  10/14/2021   KNEE ARTHROSCOPY W/ MENISCAL REPAIR Right 05/2008   L[facility]AROS[facility]IC [facility]  MITRAL VALVE REPAIR Right 06/29/2016   Procedure: MINIMALLY INVASIVE MITRAL VALVE REPAIR  (MVR);  Surgeon: ORexene Alberts MD;  [place]  Service: Open Heart Surgery;  Laterality: Right;   PERITONEAL CATHETER INSERTION  08-18-2019  '@[facility]'$    AND OPEN RIGHT INGUINAL HERNIA  REPAIR   PERITONEAL CATHETER REMOVAL  03-31-2020  '@[facility]'$    RIGHT HEART CATH N/A 10/19/2020   Procedure: RIGHT HEART CATH;  Surgeon: Larey Dresser, MD;  Location: [place] [facility];  Service: Cardiovascular;  Laterality: N/A;   RIGHT/LEFT HEART CATH AND CORONARY ANGIOGRAPHY N/A 06/07/2016   Procedure: Right/Left Heart Cath and Coronary Angiography;  Surgeon: Larey Dresser, MD;  Location: [facility];  Service: Cardiovascular;  Laterality: N/A;   TEE WITHOUT CARDIOVERSION N/A 05/04/2016   Procedure: TRANSESOPHAGEAL ECHOCARDIOGRAM (TEE);  Surgeon: Larey Dresser, MD;  Location: [facility];  Service: Cardiovascular;  Laterality: N/A;   TEE WITHOUT CARDIOVERSION N/A 06/29/2016   Procedure: TRANSESOPHAGEAL ECHOCARDIOGRAM (TEE);  Surgeon: [name], MD;  Location: [place];  Service: Open Heart Surgery;  Laterality: N/A;   TEE WITHOUT CARDIOVERSION N/A 03/06/2019   Procedure: TRANSESOPHAGEAL ECHOCARDIOGRAM (TEE);  Surgeon: Larey Dresser, MD;  Location: [facility] ENDOSCOPY;  Service: Cardiovascular;  Laterality: N/A;   TRANSURETHRAL RESECTION OF PROSTATE N/A 05/25/2020   Procedure: TRANSURETHRAL RESECTION OF THE PROSTATE (TURP) WITH CYSTOLITHOLAPAXY;  Surgeon: Ceasar Mons, MD;  [facility];   Service: Urology;  Laterality: N/A;   UMBILICAL HERNIA REPAIR  09-05-2012  [facility]    URETHROPLASTY RECONSTRUCTION / REPAIR PROSTATIC / MEMBRANOUS URETHRA  01/13/2021   [facility]    Family History  Problem Relation Age of Onset   Diabetes Mother    Hypertension Mother    Heart failure Mother    Colon cancer Neg Hx    Esophageal cancer Neg Hx    Rectal cancer Neg Hx    Stomach cancer Neg Hx     Social History   Tobacco Use   Smoking status: Never   Smokeless tobacco: Never  Vaping Use   Vaping Use: Never used  Substance Use Topics   Alcohol use: Not Currently   Drug use: Never    ROS   Objective:   Vitals: BP 121/79   Pulse 98   Temp 98.4 F (36.9 C) (Oral)   Resp 16   SpO2 93%   Physical Exam Constitutional:      General: He is not in acute distress.    Appearance: Normal appearance. He is well-developed and normal weight. He is not ill-appearing, toxic-appearing or diaphoretic.  HENT:     Head: Normocephalic and atraumatic.     Right Ear: External ear normal.     Left Ear: External ear normal.     Nose: Nose normal.     Mouth/Throat:     Pharynx: Oropharynx is clear.  Eyes:     General: No scleral icterus.       Right eye: No discharge.        Left eye: No discharge.     Extraocular Movements: Extraocular movements intact.  Cardiovascular:     Rate and Rhythm: Normal rate.  Pulmonary:     Effort: Pulmonary effort is normal.  Musculoskeletal:     Cervical back: Spasms, torticollis (left sided) and tenderness (Over area outlined) present. No swelling, edema, deformity, erythema, signs of trauma, lacerations, rigidity, bony tenderness or crepitus. Pain with movement present. Decreased range of motion (Decreased rotation but not flexion or extension).       Back:  Neurological:     Mental Status: He is alert and oriented to person, place, and time.  Psychiatric:        Mood and Affect: Mood normal.        Behavior: Behavior normal.  Thought Content:  Thought content normal.        Judgment: Judgment normal.     Assessment and Plan :   PDMP not reviewed this encounter.  1. Torticollis   2. ESRD on dialysis [facility])     Recommended treatment with torticollis using prednisone and methocarbamol which should be safe given his ESRD.  Deferred imaging given lack of trauma, suspect it would be of low yield. Counseled patient on potential for adverse effects with medications prescribed/recommended today, ER and return-to-clinic precautions discussed, patient verbalized understanding.    Jaynee Eagles, PA-C 10/29/21 1316

## 2021-10-29 NOTE — ED Triage Notes (Signed)
Pt is hemodialysis pt. C/O waking yesterday morning with left lateral neck pain and stiffness and inability to turn head due to pain. Denies injury. Denies any parasthesias.

## 2021-10-31 DIAGNOSIS — N2581 Secondary hyperparathyroidism of renal origin: Secondary | ICD-10-CM | POA: Diagnosis not present

## 2021-10-31 DIAGNOSIS — N186 End stage renal disease: Secondary | ICD-10-CM | POA: Diagnosis not present

## 2021-10-31 DIAGNOSIS — Z23 Encounter for immunization: Secondary | ICD-10-CM | POA: Diagnosis not present

## 2021-10-31 DIAGNOSIS — D689 Coagulation defect, unspecified: Secondary | ICD-10-CM | POA: Diagnosis not present

## 2021-10-31 DIAGNOSIS — L299 Pruritus, unspecified: Secondary | ICD-10-CM | POA: Diagnosis not present

## 2021-10-31 DIAGNOSIS — D509 Iron deficiency anemia, unspecified: Secondary | ICD-10-CM | POA: Diagnosis not present

## 2021-10-31 DIAGNOSIS — Z992 Dependence on renal dialysis: Secondary | ICD-10-CM | POA: Diagnosis not present

## 2021-11-01 DIAGNOSIS — N186 End stage renal disease: Secondary | ICD-10-CM | POA: Diagnosis not present

## 2021-11-01 DIAGNOSIS — Z992 Dependence on renal dialysis: Secondary | ICD-10-CM | POA: Diagnosis not present

## 2021-11-01 DIAGNOSIS — I129 Hypertensive chronic kidney disease with stage 1 through stage 4 chronic kidney disease, or unspecified chronic kidney disease: Secondary | ICD-10-CM | POA: Diagnosis not present

## 2021-11-02 DIAGNOSIS — N2581 Secondary hyperparathyroidism of renal origin: Secondary | ICD-10-CM | POA: Diagnosis not present

## 2021-11-02 DIAGNOSIS — L299 Pruritus, unspecified: Secondary | ICD-10-CM | POA: Diagnosis not present

## 2021-11-02 DIAGNOSIS — D689 Coagulation defect, unspecified: Secondary | ICD-10-CM | POA: Diagnosis not present

## 2021-11-02 DIAGNOSIS — D509 Iron deficiency anemia, unspecified: Secondary | ICD-10-CM | POA: Diagnosis not present

## 2021-11-02 DIAGNOSIS — Z992 Dependence on renal dialysis: Secondary | ICD-10-CM | POA: Diagnosis not present

## 2021-11-02 DIAGNOSIS — N186 End stage renal disease: Secondary | ICD-10-CM | POA: Diagnosis not present

## 2021-11-03 DIAGNOSIS — D631 Anemia in chronic kidney disease: Secondary | ICD-10-CM | POA: Diagnosis not present

## 2021-11-03 DIAGNOSIS — N186 End stage renal disease: Secondary | ICD-10-CM | POA: Diagnosis not present

## 2021-11-03 DIAGNOSIS — Z9884 Bariatric surgery status: Secondary | ICD-10-CM | POA: Diagnosis not present

## 2021-11-03 DIAGNOSIS — I120 Hypertensive chronic kidney disease with stage 5 chronic kidney disease or end stage renal disease: Secondary | ICD-10-CM | POA: Diagnosis not present

## 2021-11-03 DIAGNOSIS — Z992 Dependence on renal dialysis: Secondary | ICD-10-CM | POA: Diagnosis not present

## 2021-11-03 DIAGNOSIS — I509 Heart failure, unspecified: Secondary | ICD-10-CM | POA: Diagnosis not present

## 2021-11-03 DIAGNOSIS — Z1159 Encounter for screening for other viral diseases: Secondary | ICD-10-CM | POA: Diagnosis not present

## 2021-11-03 DIAGNOSIS — Z9889 Other specified postprocedural states: Secondary | ICD-10-CM | POA: Diagnosis not present

## 2021-11-03 DIAGNOSIS — Z7682 Awaiting organ transplant status: Secondary | ICD-10-CM | POA: Diagnosis not present

## 2021-11-04 DIAGNOSIS — D509 Iron deficiency anemia, unspecified: Secondary | ICD-10-CM | POA: Diagnosis not present

## 2021-11-04 DIAGNOSIS — D689 Coagulation defect, unspecified: Secondary | ICD-10-CM | POA: Diagnosis not present

## 2021-11-04 DIAGNOSIS — N2581 Secondary hyperparathyroidism of renal origin: Secondary | ICD-10-CM | POA: Diagnosis not present

## 2021-11-04 DIAGNOSIS — N186 End stage renal disease: Secondary | ICD-10-CM | POA: Diagnosis not present

## 2021-11-04 DIAGNOSIS — Z992 Dependence on renal dialysis: Secondary | ICD-10-CM | POA: Diagnosis not present

## 2021-11-04 DIAGNOSIS — L299 Pruritus, unspecified: Secondary | ICD-10-CM | POA: Diagnosis not present

## 2021-11-07 DIAGNOSIS — D689 Coagulation defect, unspecified: Secondary | ICD-10-CM | POA: Diagnosis not present

## 2021-11-07 DIAGNOSIS — N2581 Secondary hyperparathyroidism of renal origin: Secondary | ICD-10-CM | POA: Diagnosis not present

## 2021-11-07 DIAGNOSIS — D509 Iron deficiency anemia, unspecified: Secondary | ICD-10-CM | POA: Diagnosis not present

## 2021-11-07 DIAGNOSIS — Z992 Dependence on renal dialysis: Secondary | ICD-10-CM | POA: Diagnosis not present

## 2021-11-07 DIAGNOSIS — N186 End stage renal disease: Secondary | ICD-10-CM | POA: Diagnosis not present

## 2021-11-07 DIAGNOSIS — L299 Pruritus, unspecified: Secondary | ICD-10-CM | POA: Diagnosis not present

## 2021-11-08 ENCOUNTER — Ambulatory Visit (HOSPITAL_COMMUNITY)
Admission: RE | Admit: 2021-11-08 | Discharge: 2021-11-08 | Disposition: A | Discharge status: Home / Self Care | Payer: Medicare Other | Source: Ambulatory Visit | Attending: Nephrology | Admitting: Nephrology

## 2021-11-08 DIAGNOSIS — R188 Other ascites: Secondary | ICD-10-CM | POA: Diagnosis not present

## 2021-11-08 DIAGNOSIS — N186 End stage renal disease: Secondary | ICD-10-CM | POA: Diagnosis not present

## 2021-11-08 HISTORY — PX: IR PARACENTESIS: IMG2679

## 2021-11-08 MED ORDER — ALBUMIN HUMAN 25 % IV SOLN
25.0000 g | Freq: Once | INTRAVENOUS | Status: AC
  Administered 2021-11-08: 25 g via INTRAVENOUS

## 2021-11-08 MED ORDER — LIDOCAINE HCL 1 % IJ SOLN
INTRAMUSCULAR | Status: AC
  Administered 2021-11-08: 8 mL
  Filled 2021-11-08: qty 20

## 2021-11-08 MED ORDER — ALBUMIN HUMAN 25 % IV SOLN
INTRAVENOUS | Status: AC
  Filled 2021-11-08: qty 100

## 2021-11-08 NOTE — Procedures (Signed)
PROCEDURE SUMMARY:  Successful US guided paracentesis from right lower abdomen.  Yielded 3.6 L of clear yellow fluid.  No immediate complications.  Pt tolerated well.   Specimen not sent for labs.  EBL < 2 mL  Theresa Duty, NP 11/08/2021 1:44 PM

## 2021-11-09 DIAGNOSIS — D509 Iron deficiency anemia, unspecified: Secondary | ICD-10-CM | POA: Diagnosis not present

## 2021-11-09 DIAGNOSIS — N186 End stage renal disease: Secondary | ICD-10-CM | POA: Diagnosis not present

## 2021-11-09 DIAGNOSIS — N2581 Secondary hyperparathyroidism of renal origin: Secondary | ICD-10-CM | POA: Diagnosis not present

## 2021-11-09 DIAGNOSIS — Z992 Dependence on renal dialysis: Secondary | ICD-10-CM | POA: Diagnosis not present

## 2021-11-09 DIAGNOSIS — D689 Coagulation defect, unspecified: Secondary | ICD-10-CM | POA: Diagnosis not present

## 2021-11-09 DIAGNOSIS — L299 Pruritus, unspecified: Secondary | ICD-10-CM | POA: Diagnosis not present

## 2021-11-11 DIAGNOSIS — D509 Iron deficiency anemia, unspecified: Secondary | ICD-10-CM | POA: Diagnosis not present

## 2021-11-11 DIAGNOSIS — N2581 Secondary hyperparathyroidism of renal origin: Secondary | ICD-10-CM | POA: Diagnosis not present

## 2021-11-11 DIAGNOSIS — D689 Coagulation defect, unspecified: Secondary | ICD-10-CM | POA: Diagnosis not present

## 2021-11-11 DIAGNOSIS — L299 Pruritus, unspecified: Secondary | ICD-10-CM | POA: Diagnosis not present

## 2021-11-11 DIAGNOSIS — Z992 Dependence on renal dialysis: Secondary | ICD-10-CM | POA: Diagnosis not present

## 2021-11-11 DIAGNOSIS — N186 End stage renal disease: Secondary | ICD-10-CM | POA: Diagnosis not present

## 2021-11-14 DIAGNOSIS — N186 End stage renal disease: Secondary | ICD-10-CM | POA: Diagnosis not present

## 2021-11-14 DIAGNOSIS — D509 Iron deficiency anemia, unspecified: Secondary | ICD-10-CM | POA: Diagnosis not present

## 2021-11-14 DIAGNOSIS — Z992 Dependence on renal dialysis: Secondary | ICD-10-CM | POA: Diagnosis not present

## 2021-11-14 DIAGNOSIS — L299 Pruritus, unspecified: Secondary | ICD-10-CM | POA: Diagnosis not present

## 2021-11-14 DIAGNOSIS — D689 Coagulation defect, unspecified: Secondary | ICD-10-CM | POA: Diagnosis not present

## 2021-11-14 DIAGNOSIS — N2581 Secondary hyperparathyroidism of renal origin: Secondary | ICD-10-CM | POA: Diagnosis not present

## 2021-11-15 DIAGNOSIS — N189 Chronic kidney disease, unspecified: Secondary | ICD-10-CM | POA: Diagnosis not present

## 2021-11-15 DIAGNOSIS — K8020 Calculus of gallbladder without cholecystitis without obstruction: Secondary | ICD-10-CM | POA: Diagnosis not present

## 2021-11-15 DIAGNOSIS — K7460 Unspecified cirrhosis of liver: Secondary | ICD-10-CM | POA: Diagnosis not present

## 2021-11-16 DIAGNOSIS — D509 Iron deficiency anemia, unspecified: Secondary | ICD-10-CM | POA: Diagnosis not present

## 2021-11-16 DIAGNOSIS — N2581 Secondary hyperparathyroidism of renal origin: Secondary | ICD-10-CM | POA: Diagnosis not present

## 2021-11-16 DIAGNOSIS — N186 End stage renal disease: Secondary | ICD-10-CM | POA: Diagnosis not present

## 2021-11-16 DIAGNOSIS — Z992 Dependence on renal dialysis: Secondary | ICD-10-CM | POA: Diagnosis not present

## 2021-11-16 DIAGNOSIS — L299 Pruritus, unspecified: Secondary | ICD-10-CM | POA: Diagnosis not present

## 2021-11-16 DIAGNOSIS — D689 Coagulation defect, unspecified: Secondary | ICD-10-CM | POA: Diagnosis not present

## 2021-11-18 DIAGNOSIS — N2581 Secondary hyperparathyroidism of renal origin: Secondary | ICD-10-CM | POA: Diagnosis not present

## 2021-11-18 DIAGNOSIS — L299 Pruritus, unspecified: Secondary | ICD-10-CM | POA: Diagnosis not present

## 2021-11-18 DIAGNOSIS — N186 End stage renal disease: Secondary | ICD-10-CM | POA: Diagnosis not present

## 2021-11-18 DIAGNOSIS — D509 Iron deficiency anemia, unspecified: Secondary | ICD-10-CM | POA: Diagnosis not present

## 2021-11-18 DIAGNOSIS — D689 Coagulation defect, unspecified: Secondary | ICD-10-CM | POA: Diagnosis not present

## 2021-11-18 DIAGNOSIS — Z992 Dependence on renal dialysis: Secondary | ICD-10-CM | POA: Diagnosis not present

## 2021-11-20 DIAGNOSIS — L299 Pruritus, unspecified: Secondary | ICD-10-CM | POA: Diagnosis not present

## 2021-11-20 DIAGNOSIS — N2581 Secondary hyperparathyroidism of renal origin: Secondary | ICD-10-CM | POA: Diagnosis not present

## 2021-11-20 DIAGNOSIS — D509 Iron deficiency anemia, unspecified: Secondary | ICD-10-CM | POA: Diagnosis not present

## 2021-11-20 DIAGNOSIS — Z992 Dependence on renal dialysis: Secondary | ICD-10-CM | POA: Diagnosis not present

## 2021-11-20 DIAGNOSIS — D689 Coagulation defect, unspecified: Secondary | ICD-10-CM | POA: Diagnosis not present

## 2021-11-20 DIAGNOSIS — N186 End stage renal disease: Secondary | ICD-10-CM | POA: Diagnosis not present

## 2021-11-22 DIAGNOSIS — Z992 Dependence on renal dialysis: Secondary | ICD-10-CM | POA: Diagnosis not present

## 2021-11-22 DIAGNOSIS — N2581 Secondary hyperparathyroidism of renal origin: Secondary | ICD-10-CM | POA: Diagnosis not present

## 2021-11-22 DIAGNOSIS — N186 End stage renal disease: Secondary | ICD-10-CM | POA: Diagnosis not present

## 2021-11-22 DIAGNOSIS — D689 Coagulation defect, unspecified: Secondary | ICD-10-CM | POA: Diagnosis not present

## 2021-11-22 DIAGNOSIS — L299 Pruritus, unspecified: Secondary | ICD-10-CM | POA: Diagnosis not present

## 2021-11-22 DIAGNOSIS — D509 Iron deficiency anemia, unspecified: Secondary | ICD-10-CM | POA: Diagnosis not present

## 2021-11-25 DIAGNOSIS — L299 Pruritus, unspecified: Secondary | ICD-10-CM | POA: Diagnosis not present

## 2021-11-25 DIAGNOSIS — N2581 Secondary hyperparathyroidism of renal origin: Secondary | ICD-10-CM | POA: Diagnosis not present

## 2021-11-25 DIAGNOSIS — D689 Coagulation defect, unspecified: Secondary | ICD-10-CM | POA: Diagnosis not present

## 2021-11-25 DIAGNOSIS — D509 Iron deficiency anemia, unspecified: Secondary | ICD-10-CM | POA: Diagnosis not present

## 2021-11-25 DIAGNOSIS — N186 End stage renal disease: Secondary | ICD-10-CM | POA: Diagnosis not present

## 2021-11-25 DIAGNOSIS — Z992 Dependence on renal dialysis: Secondary | ICD-10-CM | POA: Diagnosis not present

## 2021-11-28 DIAGNOSIS — D689 Coagulation defect, unspecified: Secondary | ICD-10-CM | POA: Diagnosis not present

## 2021-11-28 DIAGNOSIS — Z992 Dependence on renal dialysis: Secondary | ICD-10-CM | POA: Diagnosis not present

## 2021-11-28 DIAGNOSIS — N186 End stage renal disease: Secondary | ICD-10-CM | POA: Diagnosis not present

## 2021-11-28 DIAGNOSIS — L299 Pruritus, unspecified: Secondary | ICD-10-CM | POA: Diagnosis not present

## 2021-11-28 DIAGNOSIS — D509 Iron deficiency anemia, unspecified: Secondary | ICD-10-CM | POA: Diagnosis not present

## 2021-11-28 DIAGNOSIS — N2581 Secondary hyperparathyroidism of renal origin: Secondary | ICD-10-CM | POA: Diagnosis not present

## 2021-11-30 DIAGNOSIS — N2581 Secondary hyperparathyroidism of renal origin: Secondary | ICD-10-CM | POA: Diagnosis not present

## 2021-11-30 DIAGNOSIS — Z992 Dependence on renal dialysis: Secondary | ICD-10-CM | POA: Diagnosis not present

## 2021-11-30 DIAGNOSIS — L299 Pruritus, unspecified: Secondary | ICD-10-CM | POA: Diagnosis not present

## 2021-11-30 DIAGNOSIS — D689 Coagulation defect, unspecified: Secondary | ICD-10-CM | POA: Diagnosis not present

## 2021-11-30 DIAGNOSIS — N186 End stage renal disease: Secondary | ICD-10-CM | POA: Diagnosis not present

## 2021-11-30 DIAGNOSIS — D509 Iron deficiency anemia, unspecified: Secondary | ICD-10-CM | POA: Diagnosis not present

## 2021-12-01 DIAGNOSIS — N186 End stage renal disease: Secondary | ICD-10-CM | POA: Diagnosis not present

## 2021-12-01 DIAGNOSIS — Z992 Dependence on renal dialysis: Secondary | ICD-10-CM | POA: Diagnosis not present

## 2021-12-01 DIAGNOSIS — I129 Hypertensive chronic kidney disease with stage 1 through stage 4 chronic kidney disease, or unspecified chronic kidney disease: Secondary | ICD-10-CM | POA: Diagnosis not present

## 2021-12-02 DIAGNOSIS — N186 End stage renal disease: Secondary | ICD-10-CM | POA: Diagnosis not present

## 2021-12-02 DIAGNOSIS — N2581 Secondary hyperparathyroidism of renal origin: Secondary | ICD-10-CM | POA: Diagnosis not present

## 2021-12-02 DIAGNOSIS — D689 Coagulation defect, unspecified: Secondary | ICD-10-CM | POA: Diagnosis not present

## 2021-12-02 DIAGNOSIS — D509 Iron deficiency anemia, unspecified: Secondary | ICD-10-CM | POA: Diagnosis not present

## 2021-12-02 DIAGNOSIS — Z992 Dependence on renal dialysis: Secondary | ICD-10-CM | POA: Diagnosis not present

## 2021-12-05 DIAGNOSIS — D509 Iron deficiency anemia, unspecified: Secondary | ICD-10-CM | POA: Diagnosis not present

## 2021-12-05 DIAGNOSIS — D689 Coagulation defect, unspecified: Secondary | ICD-10-CM | POA: Diagnosis not present

## 2021-12-05 DIAGNOSIS — N2581 Secondary hyperparathyroidism of renal origin: Secondary | ICD-10-CM | POA: Diagnosis not present

## 2021-12-05 DIAGNOSIS — Z992 Dependence on renal dialysis: Secondary | ICD-10-CM | POA: Diagnosis not present

## 2021-12-05 DIAGNOSIS — N186 End stage renal disease: Secondary | ICD-10-CM | POA: Diagnosis not present

## 2021-12-06 ENCOUNTER — Ambulatory Visit (HOSPITAL_COMMUNITY)
Admission: RE | Admit: 2021-12-06 | Discharge: 2021-12-06 | Disposition: A | Discharge status: Home / Self Care | Payer: Medicare Other | Source: Ambulatory Visit | Attending: Nephrology | Admitting: Nephrology

## 2021-12-06 DIAGNOSIS — N186 End stage renal disease: Secondary | ICD-10-CM | POA: Diagnosis not present

## 2021-12-06 DIAGNOSIS — R188 Other ascites: Secondary | ICD-10-CM | POA: Diagnosis not present

## 2021-12-06 HISTORY — PX: IR PARACENTESIS: IMG2679

## 2021-12-06 MED ORDER — ALBUMIN HUMAN 25 % IV SOLN
INTRAVENOUS | Status: AC
  Filled 2021-12-06: qty 100

## 2021-12-06 MED ORDER — ALBUMIN HUMAN 25 % IV SOLN
25.0000 g | Freq: Once | INTRAVENOUS | Status: AC
  Administered 2021-12-06: 25 g via INTRAVENOUS
  Filled 2021-12-06: qty 100

## 2021-12-06 MED ORDER — LIDOCAINE HCL 1 % IJ SOLN
INTRAMUSCULAR | Status: AC
  Filled 2021-12-06: qty 20

## 2021-12-06 NOTE — Procedures (Signed)
PROCEDURE SUMMARY:  Successful US guided paracentesis from LLQ.  Yielded 1.3L of ascitic fluid.  No immediate complications.  Pt tolerated well.   Specimen not sent for labs.  EBL < 64m  Sharnelle Cappelli PA-C 12/06/2021 10:15 AM

## 2021-12-07 DIAGNOSIS — N2581 Secondary hyperparathyroidism of renal origin: Secondary | ICD-10-CM | POA: Diagnosis not present

## 2021-12-07 DIAGNOSIS — N186 End stage renal disease: Secondary | ICD-10-CM | POA: Diagnosis not present

## 2021-12-07 DIAGNOSIS — D689 Coagulation defect, unspecified: Secondary | ICD-10-CM | POA: Diagnosis not present

## 2021-12-07 DIAGNOSIS — D509 Iron deficiency anemia, unspecified: Secondary | ICD-10-CM | POA: Diagnosis not present

## 2021-12-07 DIAGNOSIS — Z992 Dependence on renal dialysis: Secondary | ICD-10-CM | POA: Diagnosis not present

## 2021-12-09 DIAGNOSIS — D509 Iron deficiency anemia, unspecified: Secondary | ICD-10-CM | POA: Diagnosis not present

## 2021-12-09 DIAGNOSIS — D689 Coagulation defect, unspecified: Secondary | ICD-10-CM | POA: Diagnosis not present

## 2021-12-09 DIAGNOSIS — N2581 Secondary hyperparathyroidism of renal origin: Secondary | ICD-10-CM | POA: Diagnosis not present

## 2021-12-09 DIAGNOSIS — N186 End stage renal disease: Secondary | ICD-10-CM | POA: Diagnosis not present

## 2021-12-09 DIAGNOSIS — Z992 Dependence on renal dialysis: Secondary | ICD-10-CM | POA: Diagnosis not present

## 2021-12-12 DIAGNOSIS — D689 Coagulation defect, unspecified: Secondary | ICD-10-CM | POA: Diagnosis not present

## 2021-12-12 DIAGNOSIS — D509 Iron deficiency anemia, unspecified: Secondary | ICD-10-CM | POA: Diagnosis not present

## 2021-12-12 DIAGNOSIS — N2581 Secondary hyperparathyroidism of renal origin: Secondary | ICD-10-CM | POA: Diagnosis not present

## 2021-12-12 DIAGNOSIS — N186 End stage renal disease: Secondary | ICD-10-CM | POA: Diagnosis not present

## 2021-12-12 DIAGNOSIS — Z992 Dependence on renal dialysis: Secondary | ICD-10-CM | POA: Diagnosis not present

## 2021-12-13 DIAGNOSIS — Z7682 Awaiting organ transplant status: Secondary | ICD-10-CM | POA: Diagnosis not present

## 2021-12-14 DIAGNOSIS — D509 Iron deficiency anemia, unspecified: Secondary | ICD-10-CM | POA: Diagnosis not present

## 2021-12-14 DIAGNOSIS — N186 End stage renal disease: Secondary | ICD-10-CM | POA: Diagnosis not present

## 2021-12-14 DIAGNOSIS — N2581 Secondary hyperparathyroidism of renal origin: Secondary | ICD-10-CM | POA: Diagnosis not present

## 2021-12-14 DIAGNOSIS — Z992 Dependence on renal dialysis: Secondary | ICD-10-CM | POA: Diagnosis not present

## 2021-12-14 DIAGNOSIS — D689 Coagulation defect, unspecified: Secondary | ICD-10-CM | POA: Diagnosis not present

## 2021-12-16 DIAGNOSIS — Z992 Dependence on renal dialysis: Secondary | ICD-10-CM | POA: Diagnosis not present

## 2021-12-16 DIAGNOSIS — N2581 Secondary hyperparathyroidism of renal origin: Secondary | ICD-10-CM | POA: Diagnosis not present

## 2021-12-16 DIAGNOSIS — D509 Iron deficiency anemia, unspecified: Secondary | ICD-10-CM | POA: Diagnosis not present

## 2021-12-16 DIAGNOSIS — N186 End stage renal disease: Secondary | ICD-10-CM | POA: Diagnosis not present

## 2021-12-16 DIAGNOSIS — D689 Coagulation defect, unspecified: Secondary | ICD-10-CM | POA: Diagnosis not present

## 2021-12-19 DIAGNOSIS — Z992 Dependence on renal dialysis: Secondary | ICD-10-CM | POA: Diagnosis not present

## 2021-12-19 DIAGNOSIS — N186 End stage renal disease: Secondary | ICD-10-CM | POA: Diagnosis not present

## 2021-12-19 DIAGNOSIS — N2581 Secondary hyperparathyroidism of renal origin: Secondary | ICD-10-CM | POA: Diagnosis not present

## 2021-12-19 DIAGNOSIS — D689 Coagulation defect, unspecified: Secondary | ICD-10-CM | POA: Diagnosis not present

## 2021-12-19 DIAGNOSIS — D509 Iron deficiency anemia, unspecified: Secondary | ICD-10-CM | POA: Diagnosis not present

## 2021-12-21 DIAGNOSIS — D689 Coagulation defect, unspecified: Secondary | ICD-10-CM | POA: Diagnosis not present

## 2021-12-21 DIAGNOSIS — Z992 Dependence on renal dialysis: Secondary | ICD-10-CM | POA: Diagnosis not present

## 2021-12-21 DIAGNOSIS — N2581 Secondary hyperparathyroidism of renal origin: Secondary | ICD-10-CM | POA: Diagnosis not present

## 2021-12-21 DIAGNOSIS — D509 Iron deficiency anemia, unspecified: Secondary | ICD-10-CM | POA: Diagnosis not present

## 2021-12-21 DIAGNOSIS — N186 End stage renal disease: Secondary | ICD-10-CM | POA: Diagnosis not present

## 2021-12-22 DIAGNOSIS — T82858A Stenosis of vascular prosthetic devices, implants and grafts, initial encounter: Secondary | ICD-10-CM | POA: Diagnosis not present

## 2021-12-22 DIAGNOSIS — Z992 Dependence on renal dialysis: Secondary | ICD-10-CM | POA: Diagnosis not present

## 2021-12-22 DIAGNOSIS — I871 Compression of vein: Secondary | ICD-10-CM | POA: Diagnosis not present

## 2021-12-22 DIAGNOSIS — N186 End stage renal disease: Secondary | ICD-10-CM | POA: Diagnosis not present

## 2021-12-23 DIAGNOSIS — N2581 Secondary hyperparathyroidism of renal origin: Secondary | ICD-10-CM | POA: Diagnosis not present

## 2021-12-23 DIAGNOSIS — N186 End stage renal disease: Secondary | ICD-10-CM | POA: Diagnosis not present

## 2021-12-23 DIAGNOSIS — Z992 Dependence on renal dialysis: Secondary | ICD-10-CM | POA: Diagnosis not present

## 2021-12-23 DIAGNOSIS — D689 Coagulation defect, unspecified: Secondary | ICD-10-CM | POA: Diagnosis not present

## 2021-12-23 DIAGNOSIS — D509 Iron deficiency anemia, unspecified: Secondary | ICD-10-CM | POA: Diagnosis not present

## 2021-12-25 DIAGNOSIS — D509 Iron deficiency anemia, unspecified: Secondary | ICD-10-CM | POA: Diagnosis not present

## 2021-12-25 DIAGNOSIS — N2581 Secondary hyperparathyroidism of renal origin: Secondary | ICD-10-CM | POA: Diagnosis not present

## 2021-12-25 DIAGNOSIS — N186 End stage renal disease: Secondary | ICD-10-CM | POA: Diagnosis not present

## 2021-12-25 DIAGNOSIS — Z992 Dependence on renal dialysis: Secondary | ICD-10-CM | POA: Diagnosis not present

## 2021-12-25 DIAGNOSIS — D689 Coagulation defect, unspecified: Secondary | ICD-10-CM | POA: Diagnosis not present

## 2021-12-28 DIAGNOSIS — N2581 Secondary hyperparathyroidism of renal origin: Secondary | ICD-10-CM | POA: Diagnosis not present

## 2021-12-28 DIAGNOSIS — D509 Iron deficiency anemia, unspecified: Secondary | ICD-10-CM | POA: Diagnosis not present

## 2021-12-28 DIAGNOSIS — N186 End stage renal disease: Secondary | ICD-10-CM | POA: Diagnosis not present

## 2021-12-28 DIAGNOSIS — Z992 Dependence on renal dialysis: Secondary | ICD-10-CM | POA: Diagnosis not present

## 2021-12-28 DIAGNOSIS — D689 Coagulation defect, unspecified: Secondary | ICD-10-CM | POA: Diagnosis not present

## 2021-12-29 ENCOUNTER — Telehealth (HOSPITAL_COMMUNITY): Payer: Self-pay | Admitting: Cardiology

## 2021-12-29 DIAGNOSIS — R0602 Shortness of breath: Secondary | ICD-10-CM | POA: Diagnosis not present

## 2021-12-29 DIAGNOSIS — N2581 Secondary hyperparathyroidism of renal origin: Secondary | ICD-10-CM | POA: Diagnosis not present

## 2021-12-29 DIAGNOSIS — Z992 Dependence on renal dialysis: Secondary | ICD-10-CM | POA: Diagnosis not present

## 2021-12-29 DIAGNOSIS — E8770 Fluid overload, unspecified: Secondary | ICD-10-CM | POA: Diagnosis not present

## 2021-12-29 DIAGNOSIS — N186 End stage renal disease: Secondary | ICD-10-CM | POA: Diagnosis not present

## 2021-12-29 NOTE — Telephone Encounter (Signed)
Ok to place orders.  Would get Lexiscan Cardiolite.

## 2021-12-29 NOTE — Telephone Encounter (Signed)
Pt called to request testing as ordered by [facility] transplant team (kidney and heart) to remain on transplant list  -echo  -NM stress test  DX Z01.81  Please contact Kacey,RN with any questions [phone] Fax results to [phone]   Will send to provider to confirm ok to place orders and identify which stress test is needed

## 2021-12-30 DIAGNOSIS — Z992 Dependence on renal dialysis: Secondary | ICD-10-CM | POA: Diagnosis not present

## 2021-12-30 DIAGNOSIS — N186 End stage renal disease: Secondary | ICD-10-CM | POA: Diagnosis not present

## 2021-12-30 DIAGNOSIS — D689 Coagulation defect, unspecified: Secondary | ICD-10-CM | POA: Diagnosis not present

## 2021-12-30 DIAGNOSIS — N2581 Secondary hyperparathyroidism of renal origin: Secondary | ICD-10-CM | POA: Diagnosis not present

## 2021-12-30 DIAGNOSIS — D509 Iron deficiency anemia, unspecified: Secondary | ICD-10-CM | POA: Diagnosis not present

## 2022-01-01 DIAGNOSIS — N2581 Secondary hyperparathyroidism of renal origin: Secondary | ICD-10-CM | POA: Diagnosis not present

## 2022-01-01 DIAGNOSIS — Z992 Dependence on renal dialysis: Secondary | ICD-10-CM | POA: Diagnosis not present

## 2022-01-01 DIAGNOSIS — I129 Hypertensive chronic kidney disease with stage 1 through stage 4 chronic kidney disease, or unspecified chronic kidney disease: Secondary | ICD-10-CM | POA: Diagnosis not present

## 2022-01-01 DIAGNOSIS — D509 Iron deficiency anemia, unspecified: Secondary | ICD-10-CM | POA: Diagnosis not present

## 2022-01-01 DIAGNOSIS — D689 Coagulation defect, unspecified: Secondary | ICD-10-CM | POA: Diagnosis not present

## 2022-01-01 DIAGNOSIS — N186 End stage renal disease: Secondary | ICD-10-CM | POA: Diagnosis not present

## 2022-01-04 DIAGNOSIS — D509 Iron deficiency anemia, unspecified: Secondary | ICD-10-CM | POA: Diagnosis not present

## 2022-01-04 DIAGNOSIS — N186 End stage renal disease: Secondary | ICD-10-CM | POA: Diagnosis not present

## 2022-01-04 DIAGNOSIS — Z992 Dependence on renal dialysis: Secondary | ICD-10-CM | POA: Diagnosis not present

## 2022-01-04 DIAGNOSIS — N2581 Secondary hyperparathyroidism of renal origin: Secondary | ICD-10-CM | POA: Diagnosis not present

## 2022-01-04 DIAGNOSIS — D689 Coagulation defect, unspecified: Secondary | ICD-10-CM | POA: Diagnosis not present

## 2022-01-06 DIAGNOSIS — D689 Coagulation defect, unspecified: Secondary | ICD-10-CM | POA: Diagnosis not present

## 2022-01-06 DIAGNOSIS — D509 Iron deficiency anemia, unspecified: Secondary | ICD-10-CM | POA: Diagnosis not present

## 2022-01-06 DIAGNOSIS — Z992 Dependence on renal dialysis: Secondary | ICD-10-CM | POA: Diagnosis not present

## 2022-01-06 DIAGNOSIS — N186 End stage renal disease: Secondary | ICD-10-CM | POA: Diagnosis not present

## 2022-01-06 DIAGNOSIS — N2581 Secondary hyperparathyroidism of renal origin: Secondary | ICD-10-CM | POA: Diagnosis not present

## 2022-01-09 DIAGNOSIS — D689 Coagulation defect, unspecified: Secondary | ICD-10-CM | POA: Diagnosis not present

## 2022-01-09 DIAGNOSIS — D509 Iron deficiency anemia, unspecified: Secondary | ICD-10-CM | POA: Diagnosis not present

## 2022-01-09 DIAGNOSIS — N186 End stage renal disease: Secondary | ICD-10-CM | POA: Diagnosis not present

## 2022-01-09 DIAGNOSIS — Z992 Dependence on renal dialysis: Secondary | ICD-10-CM | POA: Diagnosis not present

## 2022-01-09 DIAGNOSIS — N2581 Secondary hyperparathyroidism of renal origin: Secondary | ICD-10-CM | POA: Diagnosis not present

## 2022-01-11 DIAGNOSIS — D509 Iron deficiency anemia, unspecified: Secondary | ICD-10-CM | POA: Diagnosis not present

## 2022-01-11 DIAGNOSIS — N186 End stage renal disease: Secondary | ICD-10-CM | POA: Diagnosis not present

## 2022-01-11 DIAGNOSIS — Z992 Dependence on renal dialysis: Secondary | ICD-10-CM | POA: Diagnosis not present

## 2022-01-11 DIAGNOSIS — D689 Coagulation defect, unspecified: Secondary | ICD-10-CM | POA: Diagnosis not present

## 2022-01-11 DIAGNOSIS — N2581 Secondary hyperparathyroidism of renal origin: Secondary | ICD-10-CM | POA: Diagnosis not present

## 2022-01-13 DIAGNOSIS — D509 Iron deficiency anemia, unspecified: Secondary | ICD-10-CM | POA: Diagnosis not present

## 2022-01-13 DIAGNOSIS — N2581 Secondary hyperparathyroidism of renal origin: Secondary | ICD-10-CM | POA: Diagnosis not present

## 2022-01-13 DIAGNOSIS — Z992 Dependence on renal dialysis: Secondary | ICD-10-CM | POA: Diagnosis not present

## 2022-01-13 DIAGNOSIS — N186 End stage renal disease: Secondary | ICD-10-CM | POA: Diagnosis not present

## 2022-01-13 DIAGNOSIS — D689 Coagulation defect, unspecified: Secondary | ICD-10-CM | POA: Diagnosis not present

## 2022-01-14 DIAGNOSIS — Z992 Dependence on renal dialysis: Secondary | ICD-10-CM | POA: Diagnosis not present

## 2022-01-14 DIAGNOSIS — M109 Gout, unspecified: Secondary | ICD-10-CM | POA: Diagnosis not present

## 2022-01-14 DIAGNOSIS — E785 Hyperlipidemia, unspecified: Secondary | ICD-10-CM | POA: Diagnosis not present

## 2022-01-14 DIAGNOSIS — I130 Hypertensive heart and chronic kidney disease with heart failure and stage 1 through stage 4 chronic kidney disease, or unspecified chronic kidney disease: Secondary | ICD-10-CM | POA: Diagnosis not present

## 2022-01-14 DIAGNOSIS — L309 Dermatitis, unspecified: Secondary | ICD-10-CM | POA: Diagnosis not present

## 2022-01-14 DIAGNOSIS — N186 End stage renal disease: Secondary | ICD-10-CM | POA: Diagnosis not present

## 2022-01-14 DIAGNOSIS — J45909 Unspecified asthma, uncomplicated: Secondary | ICD-10-CM | POA: Diagnosis not present

## 2022-01-16 DIAGNOSIS — Z992 Dependence on renal dialysis: Secondary | ICD-10-CM | POA: Diagnosis not present

## 2022-01-16 DIAGNOSIS — D689 Coagulation defect, unspecified: Secondary | ICD-10-CM | POA: Diagnosis not present

## 2022-01-16 DIAGNOSIS — D509 Iron deficiency anemia, unspecified: Secondary | ICD-10-CM | POA: Diagnosis not present

## 2022-01-16 DIAGNOSIS — N186 End stage renal disease: Secondary | ICD-10-CM | POA: Diagnosis not present

## 2022-01-16 DIAGNOSIS — N2581 Secondary hyperparathyroidism of renal origin: Secondary | ICD-10-CM | POA: Diagnosis not present

## 2022-01-17 ENCOUNTER — Other Ambulatory Visit (HOSPITAL_COMMUNITY): Payer: Self-pay

## 2022-01-17 ENCOUNTER — Encounter (HOSPITAL_COMMUNITY): Payer: Self-pay | Admitting: Cardiology

## 2022-01-17 ENCOUNTER — Telehealth (HOSPITAL_COMMUNITY): Payer: Self-pay

## 2022-01-17 ENCOUNTER — Ambulatory Visit (HOSPITAL_COMMUNITY)
Admission: RE | Admit: 2022-01-17 | Discharge: 2022-01-17 | Disposition: A | Discharge status: Home / Self Care | Payer: 59 | Source: Ambulatory Visit | Attending: Cardiology | Admitting: Cardiology

## 2022-01-17 DIAGNOSIS — R55 Syncope and collapse: Secondary | ICD-10-CM | POA: Diagnosis not present

## 2022-01-17 DIAGNOSIS — I2510 Atherosclerotic heart disease of native coronary artery without angina pectoris: Secondary | ICD-10-CM

## 2022-01-17 DIAGNOSIS — Z01818 Encounter for other preprocedural examination: Secondary | ICD-10-CM | POA: Diagnosis not present

## 2022-01-17 DIAGNOSIS — N186 End stage renal disease: Secondary | ICD-10-CM | POA: Diagnosis not present

## 2022-01-17 DIAGNOSIS — I272 Pulmonary hypertension, unspecified: Secondary | ICD-10-CM | POA: Insufficient documentation

## 2022-01-17 DIAGNOSIS — I77819 Aortic ectasia, unspecified site: Secondary | ICD-10-CM | POA: Insufficient documentation

## 2022-01-17 DIAGNOSIS — I132 Hypertensive heart and chronic kidney disease with heart failure and with stage 5 chronic kidney disease, or end stage renal disease: Secondary | ICD-10-CM | POA: Insufficient documentation

## 2022-01-17 DIAGNOSIS — I5032 Chronic diastolic (congestive) heart failure: Secondary | ICD-10-CM

## 2022-01-17 DIAGNOSIS — I081 Rheumatic disorders of both mitral and tricuspid valves: Secondary | ICD-10-CM | POA: Diagnosis not present

## 2022-01-17 DIAGNOSIS — R079 Chest pain, unspecified: Secondary | ICD-10-CM | POA: Insufficient documentation

## 2022-01-17 DIAGNOSIS — L2089 Other atopic dermatitis: Secondary | ICD-10-CM | POA: Diagnosis not present

## 2022-01-17 DIAGNOSIS — I429 Cardiomyopathy, unspecified: Secondary | ICD-10-CM | POA: Insufficient documentation

## 2022-01-17 LAB — ECHOCARDIOGRAM COMPLETE
AR max vel: 2.9 cm2
AV Area VTI: 2.57 cm2
AV Area mean vel: 2.78 cm2
AV Mean grad: 5 mmHg
AV Peak grad: 8.9 mmHg
Ao pk vel: 1.49 m/s
Area-P 1/2: 3.77 cm2
MV VTI: 2.15 cm2
S' Lateral: 4.3 cm

## 2022-01-17 NOTE — Telephone Encounter (Signed)
Order signed

## 2022-01-17 NOTE — Telephone Encounter (Signed)
Its been approved and scheduled.

## 2022-01-17 NOTE — Progress Notes (Signed)
Echocardiogram 2D Echocardiogram has been performed.  Ronny Flurry 01/17/2022, 3:57 PM

## 2022-01-17 NOTE — Telephone Encounter (Signed)
Orders have been placed, echo is scheduled for today, Vernon Blackburn does he need precert for the NM stress test?

## 2022-01-18 ENCOUNTER — Telehealth (HOSPITAL_COMMUNITY): Payer: Self-pay

## 2022-01-18 ENCOUNTER — Telehealth (HOSPITAL_COMMUNITY): Payer: Self-pay | Admitting: *Deleted

## 2022-01-18 DIAGNOSIS — Z992 Dependence on renal dialysis: Secondary | ICD-10-CM | POA: Diagnosis not present

## 2022-01-18 DIAGNOSIS — N2581 Secondary hyperparathyroidism of renal origin: Secondary | ICD-10-CM | POA: Diagnosis not present

## 2022-01-18 DIAGNOSIS — N186 End stage renal disease: Secondary | ICD-10-CM | POA: Diagnosis not present

## 2022-01-18 DIAGNOSIS — D509 Iron deficiency anemia, unspecified: Secondary | ICD-10-CM | POA: Diagnosis not present

## 2022-01-18 DIAGNOSIS — D689 Coagulation defect, unspecified: Secondary | ICD-10-CM | POA: Diagnosis not present

## 2022-01-18 NOTE — Telephone Encounter (Signed)
-----  Message from Larey Dresser, MD sent at 01/17/2022  6:14 PM EST ----- LV EF is stable, mildly reduced.  Stable mitral valve repair.

## 2022-01-18 NOTE — Telephone Encounter (Signed)
Pt aware, agreeable, and verbalized understanding--reviewed ECHO results, reminded of cardiology (tread) appt tomorrow 1/18.

## 2022-01-18 NOTE — Telephone Encounter (Signed)
Spoke with patient about his scheduled stress test for 01/19/22 at 10:15 am. He was also given detailed instructions and asked to arrive 15 minutes earlier than his scheduled visit.

## 2022-01-19 ENCOUNTER — Ambulatory Visit (HOSPITAL_COMMUNITY): Payer: 59 | Attending: Cardiology

## 2022-01-19 DIAGNOSIS — Z01818 Encounter for other preprocedural examination: Secondary | ICD-10-CM | POA: Diagnosis not present

## 2022-01-19 DIAGNOSIS — I5032 Chronic diastolic (congestive) heart failure: Secondary | ICD-10-CM

## 2022-01-19 LAB — MYOCARDIAL PERFUSION IMAGING
Estimated workload: 8.8
Exercise duration (min): 7 min
Exercise duration (sec): 35 s
LV dias vol: 95 mL (ref 62–150)
LV sys vol: 53 mL
MPHR: 165 {beats}/min
Nuc Stress EF: 45 %
Peak HR: 150 {beats}/min
Percent HR: 90 %
Rest HR: 79 {beats}/min
Rest Nuclear Isotope Dose: 10.9 mCi
SDS: 0
SRS: 0
SSS: 0
ST Depression (mm): 0 mm
Stress Nuclear Isotope Dose: 32.9 mCi
TID: 0.9

## 2022-01-19 MED ORDER — TECHNETIUM TC 99M TETROFOSMIN IV KIT
10.9000 | PACK | Freq: Once | INTRAVENOUS | Status: AC | PRN
  Administered 2022-01-19: 10.9 via INTRAVENOUS

## 2022-01-19 MED ORDER — TECHNETIUM TC 99M TETROFOSMIN IV KIT
32.9000 | PACK | Freq: Once | INTRAVENOUS | Status: AC | PRN
  Administered 2022-01-19: 32.9 via INTRAVENOUS

## 2022-01-20 DIAGNOSIS — N2581 Secondary hyperparathyroidism of renal origin: Secondary | ICD-10-CM | POA: Diagnosis not present

## 2022-01-20 DIAGNOSIS — Z992 Dependence on renal dialysis: Secondary | ICD-10-CM | POA: Diagnosis not present

## 2022-01-20 DIAGNOSIS — D509 Iron deficiency anemia, unspecified: Secondary | ICD-10-CM | POA: Diagnosis not present

## 2022-01-20 DIAGNOSIS — N186 End stage renal disease: Secondary | ICD-10-CM | POA: Diagnosis not present

## 2022-01-20 DIAGNOSIS — D689 Coagulation defect, unspecified: Secondary | ICD-10-CM | POA: Diagnosis not present

## 2022-01-23 ENCOUNTER — Other Ambulatory Visit (HOSPITAL_COMMUNITY): Payer: Self-pay | Admitting: Nephrology

## 2022-01-23 ENCOUNTER — Encounter (HOSPITAL_COMMUNITY): Payer: Self-pay

## 2022-01-23 DIAGNOSIS — N2581 Secondary hyperparathyroidism of renal origin: Secondary | ICD-10-CM | POA: Diagnosis not present

## 2022-01-23 DIAGNOSIS — R188 Other ascites: Secondary | ICD-10-CM

## 2022-01-23 DIAGNOSIS — Z992 Dependence on renal dialysis: Secondary | ICD-10-CM | POA: Diagnosis not present

## 2022-01-23 DIAGNOSIS — D689 Coagulation defect, unspecified: Secondary | ICD-10-CM | POA: Diagnosis not present

## 2022-01-23 DIAGNOSIS — D509 Iron deficiency anemia, unspecified: Secondary | ICD-10-CM | POA: Diagnosis not present

## 2022-01-23 DIAGNOSIS — N186 End stage renal disease: Secondary | ICD-10-CM | POA: Diagnosis not present

## 2022-01-25 DIAGNOSIS — Z992 Dependence on renal dialysis: Secondary | ICD-10-CM | POA: Diagnosis not present

## 2022-01-25 DIAGNOSIS — D509 Iron deficiency anemia, unspecified: Secondary | ICD-10-CM | POA: Diagnosis not present

## 2022-01-25 DIAGNOSIS — N2581 Secondary hyperparathyroidism of renal origin: Secondary | ICD-10-CM | POA: Diagnosis not present

## 2022-01-25 DIAGNOSIS — N186 End stage renal disease: Secondary | ICD-10-CM | POA: Diagnosis not present

## 2022-01-25 DIAGNOSIS — D689 Coagulation defect, unspecified: Secondary | ICD-10-CM | POA: Diagnosis not present

## 2022-01-26 ENCOUNTER — Ambulatory Visit (HOSPITAL_COMMUNITY)
Admission: RE | Admit: 2022-01-26 | Discharge: 2022-01-26 | Disposition: A | Discharge status: Home / Self Care | Payer: 59 | Source: Ambulatory Visit | Attending: Nephrology | Admitting: Nephrology

## 2022-01-26 DIAGNOSIS — Z992 Dependence on renal dialysis: Secondary | ICD-10-CM | POA: Diagnosis not present

## 2022-01-26 DIAGNOSIS — N2581 Secondary hyperparathyroidism of renal origin: Secondary | ICD-10-CM | POA: Diagnosis not present

## 2022-01-26 DIAGNOSIS — R188 Other ascites: Secondary | ICD-10-CM | POA: Diagnosis not present

## 2022-01-26 DIAGNOSIS — D689 Coagulation defect, unspecified: Secondary | ICD-10-CM | POA: Diagnosis not present

## 2022-01-26 DIAGNOSIS — D509 Iron deficiency anemia, unspecified: Secondary | ICD-10-CM | POA: Diagnosis not present

## 2022-01-26 DIAGNOSIS — N186 End stage renal disease: Secondary | ICD-10-CM | POA: Diagnosis not present

## 2022-01-26 HISTORY — PX: IR PARACENTESIS: IMG2679

## 2022-01-26 MED ORDER — LIDOCAINE HCL 1 % IJ SOLN
INTRAMUSCULAR | Status: AC
  Filled 2022-01-26: qty 20

## 2022-01-26 MED ORDER — ALBUMIN HUMAN 25 % IV SOLN
25.0000 g | Freq: Once | INTRAVENOUS | Status: AC
  Administered 2022-01-26: 25 g via INTRAVENOUS

## 2022-01-26 MED ORDER — ALBUMIN HUMAN 25 % IV SOLN
INTRAVENOUS | Status: AC
  Filled 2022-01-26: qty 100

## 2022-01-26 NOTE — Procedures (Signed)
PROCEDURE SUMMARY:  Successful image-guided paracentesis from the left lower abdomen.  Yielded 1.2 liters of hazy yellow fluid.  No immediate complications.  EBL = trace. Patient tolerated well.   Specimen was not sent for labs.  Please see imaging section of Epic for full dictation.   Armando Gang Katerina Zurn PA-C 01/26/2022 10:08 AM

## 2022-01-30 DIAGNOSIS — N2581 Secondary hyperparathyroidism of renal origin: Secondary | ICD-10-CM | POA: Diagnosis not present

## 2022-01-30 DIAGNOSIS — N186 End stage renal disease: Secondary | ICD-10-CM | POA: Diagnosis not present

## 2022-01-30 DIAGNOSIS — D689 Coagulation defect, unspecified: Secondary | ICD-10-CM | POA: Diagnosis not present

## 2022-01-30 DIAGNOSIS — Z992 Dependence on renal dialysis: Secondary | ICD-10-CM | POA: Diagnosis not present

## 2022-01-30 DIAGNOSIS — D509 Iron deficiency anemia, unspecified: Secondary | ICD-10-CM | POA: Diagnosis not present

## 2022-02-01 DIAGNOSIS — Z992 Dependence on renal dialysis: Secondary | ICD-10-CM | POA: Diagnosis not present

## 2022-02-01 DIAGNOSIS — D509 Iron deficiency anemia, unspecified: Secondary | ICD-10-CM | POA: Diagnosis not present

## 2022-02-01 DIAGNOSIS — N186 End stage renal disease: Secondary | ICD-10-CM | POA: Diagnosis not present

## 2022-02-01 DIAGNOSIS — D689 Coagulation defect, unspecified: Secondary | ICD-10-CM | POA: Diagnosis not present

## 2022-02-01 DIAGNOSIS — I129 Hypertensive chronic kidney disease with stage 1 through stage 4 chronic kidney disease, or unspecified chronic kidney disease: Secondary | ICD-10-CM | POA: Diagnosis not present

## 2022-02-01 DIAGNOSIS — N2581 Secondary hyperparathyroidism of renal origin: Secondary | ICD-10-CM | POA: Diagnosis not present

## 2022-02-02 DIAGNOSIS — Z9884 Bariatric surgery status: Secondary | ICD-10-CM | POA: Diagnosis not present

## 2022-02-02 DIAGNOSIS — K4090 Unilateral inguinal hernia, without obstruction or gangrene, not specified as recurrent: Secondary | ICD-10-CM | POA: Diagnosis not present

## 2022-02-02 DIAGNOSIS — N289 Disorder of kidney and ureter, unspecified: Secondary | ICD-10-CM | POA: Diagnosis not present

## 2022-02-02 DIAGNOSIS — G8929 Other chronic pain: Secondary | ICD-10-CM | POA: Diagnosis not present

## 2022-02-02 DIAGNOSIS — I120 Hypertensive chronic kidney disease with stage 5 chronic kidney disease or end stage renal disease: Secondary | ICD-10-CM | POA: Diagnosis not present

## 2022-02-03 DIAGNOSIS — D509 Iron deficiency anemia, unspecified: Secondary | ICD-10-CM | POA: Diagnosis not present

## 2022-02-03 DIAGNOSIS — D689 Coagulation defect, unspecified: Secondary | ICD-10-CM | POA: Diagnosis not present

## 2022-02-03 DIAGNOSIS — E8770 Fluid overload, unspecified: Secondary | ICD-10-CM | POA: Diagnosis not present

## 2022-02-03 DIAGNOSIS — N186 End stage renal disease: Secondary | ICD-10-CM | POA: Diagnosis not present

## 2022-02-03 DIAGNOSIS — N2581 Secondary hyperparathyroidism of renal origin: Secondary | ICD-10-CM | POA: Diagnosis not present

## 2022-02-03 DIAGNOSIS — L299 Pruritus, unspecified: Secondary | ICD-10-CM | POA: Diagnosis not present

## 2022-02-03 DIAGNOSIS — Z992 Dependence on renal dialysis: Secondary | ICD-10-CM | POA: Diagnosis not present

## 2022-02-06 DIAGNOSIS — E8770 Fluid overload, unspecified: Secondary | ICD-10-CM | POA: Diagnosis not present

## 2022-02-06 DIAGNOSIS — D509 Iron deficiency anemia, unspecified: Secondary | ICD-10-CM | POA: Diagnosis not present

## 2022-02-06 DIAGNOSIS — Z7682 Awaiting organ transplant status: Secondary | ICD-10-CM | POA: Diagnosis not present

## 2022-02-06 DIAGNOSIS — L299 Pruritus, unspecified: Secondary | ICD-10-CM | POA: Diagnosis not present

## 2022-02-06 DIAGNOSIS — D689 Coagulation defect, unspecified: Secondary | ICD-10-CM | POA: Diagnosis not present

## 2022-02-06 DIAGNOSIS — N186 End stage renal disease: Secondary | ICD-10-CM | POA: Diagnosis not present

## 2022-02-06 DIAGNOSIS — Z992 Dependence on renal dialysis: Secondary | ICD-10-CM | POA: Diagnosis not present

## 2022-02-06 DIAGNOSIS — N2581 Secondary hyperparathyroidism of renal origin: Secondary | ICD-10-CM | POA: Diagnosis not present

## 2022-02-07 DIAGNOSIS — R590 Localized enlarged lymph nodes: Secondary | ICD-10-CM | POA: Diagnosis not present

## 2022-02-07 DIAGNOSIS — R188 Other ascites: Secondary | ICD-10-CM | POA: Diagnosis not present

## 2022-02-08 DIAGNOSIS — N2581 Secondary hyperparathyroidism of renal origin: Secondary | ICD-10-CM | POA: Diagnosis not present

## 2022-02-08 DIAGNOSIS — N186 End stage renal disease: Secondary | ICD-10-CM | POA: Diagnosis not present

## 2022-02-08 DIAGNOSIS — D509 Iron deficiency anemia, unspecified: Secondary | ICD-10-CM | POA: Diagnosis not present

## 2022-02-08 DIAGNOSIS — Z992 Dependence on renal dialysis: Secondary | ICD-10-CM | POA: Diagnosis not present

## 2022-02-08 DIAGNOSIS — L299 Pruritus, unspecified: Secondary | ICD-10-CM | POA: Diagnosis not present

## 2022-02-08 DIAGNOSIS — D689 Coagulation defect, unspecified: Secondary | ICD-10-CM | POA: Diagnosis not present

## 2022-02-08 DIAGNOSIS — E8770 Fluid overload, unspecified: Secondary | ICD-10-CM | POA: Diagnosis not present

## 2022-02-09 DIAGNOSIS — R590 Localized enlarged lymph nodes: Secondary | ICD-10-CM | POA: Diagnosis not present

## 2022-02-09 DIAGNOSIS — Z9884 Bariatric surgery status: Secondary | ICD-10-CM | POA: Diagnosis not present

## 2022-02-09 DIAGNOSIS — N289 Disorder of kidney and ureter, unspecified: Secondary | ICD-10-CM | POA: Diagnosis not present

## 2022-02-09 DIAGNOSIS — I120 Hypertensive chronic kidney disease with stage 5 chronic kidney disease or end stage renal disease: Secondary | ICD-10-CM | POA: Diagnosis not present

## 2022-02-09 DIAGNOSIS — G8929 Other chronic pain: Secondary | ICD-10-CM | POA: Diagnosis not present

## 2022-02-10 DIAGNOSIS — E8770 Fluid overload, unspecified: Secondary | ICD-10-CM | POA: Diagnosis not present

## 2022-02-10 DIAGNOSIS — D509 Iron deficiency anemia, unspecified: Secondary | ICD-10-CM | POA: Diagnosis not present

## 2022-02-10 DIAGNOSIS — L299 Pruritus, unspecified: Secondary | ICD-10-CM | POA: Diagnosis not present

## 2022-02-10 DIAGNOSIS — D689 Coagulation defect, unspecified: Secondary | ICD-10-CM | POA: Diagnosis not present

## 2022-02-10 DIAGNOSIS — N2581 Secondary hyperparathyroidism of renal origin: Secondary | ICD-10-CM | POA: Diagnosis not present

## 2022-02-10 DIAGNOSIS — N186 End stage renal disease: Secondary | ICD-10-CM | POA: Diagnosis not present

## 2022-02-10 DIAGNOSIS — Z992 Dependence on renal dialysis: Secondary | ICD-10-CM | POA: Diagnosis not present

## 2022-02-13 ENCOUNTER — Other Ambulatory Visit (HOSPITAL_COMMUNITY): Payer: Self-pay | Admitting: Nephrology

## 2022-02-13 DIAGNOSIS — N186 End stage renal disease: Secondary | ICD-10-CM | POA: Diagnosis not present

## 2022-02-13 DIAGNOSIS — N2581 Secondary hyperparathyroidism of renal origin: Secondary | ICD-10-CM | POA: Diagnosis not present

## 2022-02-13 DIAGNOSIS — E8770 Fluid overload, unspecified: Secondary | ICD-10-CM | POA: Diagnosis not present

## 2022-02-13 DIAGNOSIS — Z992 Dependence on renal dialysis: Secondary | ICD-10-CM | POA: Diagnosis not present

## 2022-02-13 DIAGNOSIS — D509 Iron deficiency anemia, unspecified: Secondary | ICD-10-CM | POA: Diagnosis not present

## 2022-02-13 DIAGNOSIS — L299 Pruritus, unspecified: Secondary | ICD-10-CM | POA: Diagnosis not present

## 2022-02-13 DIAGNOSIS — D689 Coagulation defect, unspecified: Secondary | ICD-10-CM | POA: Diagnosis not present

## 2022-02-13 DIAGNOSIS — R188 Other ascites: Secondary | ICD-10-CM

## 2022-02-15 DIAGNOSIS — D1723 Benign lipomatous neoplasm of skin and subcutaneous tissue of right leg: Secondary | ICD-10-CM | POA: Diagnosis not present

## 2022-02-15 DIAGNOSIS — J45909 Unspecified asthma, uncomplicated: Secondary | ICD-10-CM | POA: Diagnosis not present

## 2022-02-15 DIAGNOSIS — I509 Heart failure, unspecified: Secondary | ICD-10-CM | POA: Diagnosis not present

## 2022-02-15 DIAGNOSIS — R599 Enlarged lymph nodes, unspecified: Secondary | ICD-10-CM | POA: Diagnosis not present

## 2022-02-15 DIAGNOSIS — Z79899 Other long term (current) drug therapy: Secondary | ICD-10-CM | POA: Diagnosis not present

## 2022-02-15 DIAGNOSIS — Z7982 Long term (current) use of aspirin: Secondary | ICD-10-CM | POA: Diagnosis not present

## 2022-02-15 DIAGNOSIS — Z888 Allergy status to other drugs, medicaments and biological substances status: Secondary | ICD-10-CM | POA: Diagnosis not present

## 2022-02-15 DIAGNOSIS — I132 Hypertensive heart and chronic kidney disease with heart failure and with stage 5 chronic kidney disease, or end stage renal disease: Secondary | ICD-10-CM | POA: Diagnosis not present

## 2022-02-15 DIAGNOSIS — K4090 Unilateral inguinal hernia, without obstruction or gangrene, not specified as recurrent: Secondary | ICD-10-CM | POA: Diagnosis not present

## 2022-02-15 DIAGNOSIS — Z992 Dependence on renal dialysis: Secondary | ICD-10-CM | POA: Diagnosis not present

## 2022-02-15 DIAGNOSIS — E785 Hyperlipidemia, unspecified: Secondary | ICD-10-CM | POA: Diagnosis not present

## 2022-02-15 DIAGNOSIS — R590 Localized enlarged lymph nodes: Secondary | ICD-10-CM | POA: Diagnosis not present

## 2022-02-15 DIAGNOSIS — N186 End stage renal disease: Secondary | ICD-10-CM | POA: Diagnosis not present

## 2022-02-16 ENCOUNTER — Ambulatory Visit (HOSPITAL_COMMUNITY)
Admission: RE | Admit: 2022-02-16 | Discharge: 2022-02-16 | Disposition: A | Discharge status: Home / Self Care | Payer: 59 | Source: Ambulatory Visit | Attending: Nephrology | Admitting: Nephrology

## 2022-02-16 DIAGNOSIS — Z992 Dependence on renal dialysis: Secondary | ICD-10-CM | POA: Diagnosis not present

## 2022-02-16 DIAGNOSIS — D689 Coagulation defect, unspecified: Secondary | ICD-10-CM | POA: Diagnosis not present

## 2022-02-16 DIAGNOSIS — N2581 Secondary hyperparathyroidism of renal origin: Secondary | ICD-10-CM | POA: Diagnosis not present

## 2022-02-16 DIAGNOSIS — R188 Other ascites: Secondary | ICD-10-CM | POA: Diagnosis not present

## 2022-02-16 DIAGNOSIS — N186 End stage renal disease: Secondary | ICD-10-CM | POA: Diagnosis not present

## 2022-02-16 DIAGNOSIS — D509 Iron deficiency anemia, unspecified: Secondary | ICD-10-CM | POA: Diagnosis not present

## 2022-02-16 DIAGNOSIS — E8770 Fluid overload, unspecified: Secondary | ICD-10-CM | POA: Diagnosis not present

## 2022-02-16 DIAGNOSIS — L299 Pruritus, unspecified: Secondary | ICD-10-CM | POA: Diagnosis not present

## 2022-02-16 HISTORY — PX: IR PARACENTESIS: IMG2679

## 2022-02-16 MED ORDER — LIDOCAINE HCL 1 % IJ SOLN
INTRAMUSCULAR | Status: AC
  Filled 2022-02-16: qty 20

## 2022-02-16 MED ORDER — ALBUMIN HUMAN 25 % IV SOLN
INTRAVENOUS | Status: AC
  Filled 2022-02-16: qty 100

## 2022-02-16 MED ORDER — ALBUMIN HUMAN 25 % IV SOLN
25.0000 g | Freq: Once | INTRAVENOUS | Status: AC
  Administered 2022-02-16: 25 g via INTRAVENOUS

## 2022-02-16 NOTE — Procedures (Signed)
PROCEDURE SUMMARY:  Successful US guided paracentesis from LUQ.  Yielded 1.7L of ascitic fluid.  No immediate complications.  Pt tolerated well.   Specimen not sent for labs.  EBL < 25m  Jillene Wehrenberg PA-C 02/16/2022 11:10 AM

## 2022-02-17 DIAGNOSIS — Z992 Dependence on renal dialysis: Secondary | ICD-10-CM | POA: Diagnosis not present

## 2022-02-17 DIAGNOSIS — D509 Iron deficiency anemia, unspecified: Secondary | ICD-10-CM | POA: Diagnosis not present

## 2022-02-17 DIAGNOSIS — N2581 Secondary hyperparathyroidism of renal origin: Secondary | ICD-10-CM | POA: Diagnosis not present

## 2022-02-17 DIAGNOSIS — D689 Coagulation defect, unspecified: Secondary | ICD-10-CM | POA: Diagnosis not present

## 2022-02-17 DIAGNOSIS — E8770 Fluid overload, unspecified: Secondary | ICD-10-CM | POA: Diagnosis not present

## 2022-02-17 DIAGNOSIS — L299 Pruritus, unspecified: Secondary | ICD-10-CM | POA: Diagnosis not present

## 2022-02-17 DIAGNOSIS — N186 End stage renal disease: Secondary | ICD-10-CM | POA: Diagnosis not present

## 2022-02-20 DIAGNOSIS — D509 Iron deficiency anemia, unspecified: Secondary | ICD-10-CM | POA: Diagnosis not present

## 2022-02-20 DIAGNOSIS — Z992 Dependence on renal dialysis: Secondary | ICD-10-CM | POA: Diagnosis not present

## 2022-02-20 DIAGNOSIS — E8770 Fluid overload, unspecified: Secondary | ICD-10-CM | POA: Diagnosis not present

## 2022-02-20 DIAGNOSIS — N2581 Secondary hyperparathyroidism of renal origin: Secondary | ICD-10-CM | POA: Diagnosis not present

## 2022-02-20 DIAGNOSIS — L299 Pruritus, unspecified: Secondary | ICD-10-CM | POA: Diagnosis not present

## 2022-02-20 DIAGNOSIS — D689 Coagulation defect, unspecified: Secondary | ICD-10-CM | POA: Diagnosis not present

## 2022-02-20 DIAGNOSIS — N186 End stage renal disease: Secondary | ICD-10-CM | POA: Diagnosis not present

## 2022-02-23 DIAGNOSIS — N186 End stage renal disease: Secondary | ICD-10-CM | POA: Diagnosis not present

## 2022-02-23 DIAGNOSIS — L299 Pruritus, unspecified: Secondary | ICD-10-CM | POA: Diagnosis not present

## 2022-02-23 DIAGNOSIS — N2581 Secondary hyperparathyroidism of renal origin: Secondary | ICD-10-CM | POA: Diagnosis not present

## 2022-02-23 DIAGNOSIS — D509 Iron deficiency anemia, unspecified: Secondary | ICD-10-CM | POA: Diagnosis not present

## 2022-02-23 DIAGNOSIS — D689 Coagulation defect, unspecified: Secondary | ICD-10-CM | POA: Diagnosis not present

## 2022-02-23 DIAGNOSIS — E8770 Fluid overload, unspecified: Secondary | ICD-10-CM | POA: Diagnosis not present

## 2022-02-23 DIAGNOSIS — Z992 Dependence on renal dialysis: Secondary | ICD-10-CM | POA: Diagnosis not present

## 2022-02-24 DIAGNOSIS — E8770 Fluid overload, unspecified: Secondary | ICD-10-CM | POA: Diagnosis not present

## 2022-02-24 DIAGNOSIS — Z992 Dependence on renal dialysis: Secondary | ICD-10-CM | POA: Diagnosis not present

## 2022-02-24 DIAGNOSIS — D509 Iron deficiency anemia, unspecified: Secondary | ICD-10-CM | POA: Diagnosis not present

## 2022-02-24 DIAGNOSIS — N186 End stage renal disease: Secondary | ICD-10-CM | POA: Diagnosis not present

## 2022-02-24 DIAGNOSIS — D689 Coagulation defect, unspecified: Secondary | ICD-10-CM | POA: Diagnosis not present

## 2022-02-24 DIAGNOSIS — L299 Pruritus, unspecified: Secondary | ICD-10-CM | POA: Diagnosis not present

## 2022-02-24 DIAGNOSIS — N2581 Secondary hyperparathyroidism of renal origin: Secondary | ICD-10-CM | POA: Diagnosis not present

## 2022-02-25 ENCOUNTER — Ambulatory Visit
Admission: EM | Admit: 2022-02-25 | Discharge: 2022-02-25 | Disposition: A | Discharge status: Home / Self Care | Payer: 59

## 2022-02-25 DIAGNOSIS — S31109A Unspecified open wound of abdominal wall, unspecified quadrant without penetration into peritoneal cavity, initial encounter: Secondary | ICD-10-CM | POA: Diagnosis not present

## 2022-02-25 DIAGNOSIS — T8189XA Other complications of procedures, not elsewhere classified, initial encounter: Secondary | ICD-10-CM | POA: Diagnosis not present

## 2022-02-25 DIAGNOSIS — I898 Other specified noninfective disorders of lymphatic vessels and lymph nodes: Secondary | ICD-10-CM

## 2022-02-25 NOTE — ED Provider Notes (Signed)
[facility]    CSN: [id number] Arrival date & time: 02/25/22  1144      History   Chief Complaint Chief Complaint  Patient presents with   Wound Check    HPI Vernon Blackburn is a 57 y.o. male presents for wound check.  Patient had a lymph node biopsy/excision of his right inguinofemoral nodes on 2/14.  He did a follow-up appointment on 2/21 where note reports there was some swelling and has a lymphocele/seroma.  He was recommended to do a pressure dressing and they placed him on doxycycline.  Patient states about an hour ago he went to use the restroom and it burst and has been draining a yellow fluid consistently since that time.  Denies pain to the site.  No bleeding.  Denies increase in swelling of the area.  Patient is a hemodialysis patient and is currently being worked up for kidney transplant.  He is concerned for any underlying infection.  No fevers or chills.  No other concerns at this time.   Wound Check    Past Medical History:  Diagnosis Date   ADHD    Anemia, chronic renal failure    Asthma    followed by pcp   Benign localized prostatic hyperplasia with lower urinary tract symptoms (LUTS)    Bladder stones    BPH (benign prostatic hyperplasia)    Chronic gout    05-06-2020  per pt last episode 6 months ago,  (involves right knee and right great toe)   Chronic systolic HF (heart failure) ([place])    followed by cardiology, dr Aundra Dubin @ [facility] clinic,   last TEE 03-06-2019 in epic ef 45%   Dyspnea    ESRD on hemodialysis [facility])    primary nephrologist--- dr Moshe Cipro--- HD on M/W/ F  at [place] [facility] in [place] murmur    History of bladder stone    History of cellulitis 01/27/2020   ED visit ,  abdominal wall   History of CVA (cerebrovascular accident) without residual deficits    remote infarct per imaging MRI brain in care every where 02-07-2019   History of kidney stones    History of obstructive sleep apnea     05-06-2020  per pt test positive for osa prior to gastric bypass in 2014 used cpap,  pt stated was retested after alot of weight loss , test was negative    Hypertension    followed by cardiology   NICM (nonischemic cardiomyopathy) ([place])    followed by cardiology---- 01/ 2018  ef 20-35%, severe MV stenosis;  last TEE echo in epic 03-06-2019  ef 45%   Peripheral edema    Bilateral lower extremity    S/P minimally invasive mitral valve repair followed by cardiology   06-29-2016  for severe stenosis ;  30 mm [name] via right mini thoracotomy approach;  last TEE echo in epic 03-06-2019 ef 45%, mild LVH, post op mild MR with mean gradient 26mHg and no significant stenosis   Seasonal and perennial allergic rhinitis    Sleep apnea     Patient Active Problem List   Diagnosis Date Noted   Colon cancer screening    Diverticulosis of colon without hemorrhage    Grade II internal hemorrhoids    Lipoma of colon    Essential hypertension 07/13/2020   Asthma 07/13/2020   Ascites 07/12/2020   BPH with obstruction/lower urinary tract symptoms 05/25/2020   Abdominal  wall cellulitis 01/27/2020   ESRD on dialysis [facility]) 04/22/2019   Long term (current) use of anticoagulants [Z79.01] 07/17/2016   Syncope and collapse 07/14/2016   Chest pain 07/13/2016   Other chest pain    Coagulopathy (HCC)    S/P minimally invasive mitral valve repair 06/29/2016   CHF (congestive heart failure) ([place]) 06/06/2016   Hypertensive heart and chronic kidney disease with heart failure and stage 1 through stage 4 chronic kidney disease, or chronic kidney disease ([place]) [id number]   Chronic systolic HF (heart failure) ([place])    Cardiomyopathy ([place])    Pulmonary HTN ([place])    DOE (dyspnea on exertion) 01/02/2016   Chronic kidney disease (CKD), stage IV (severe) (HCC)    Anemia    Edema 11/15/2011    Past Surgical History:  Procedure Laterality Date   AV FISTULA PLACEMENT Left 03/10/2019    Procedure: LEFT ARM BASILIC ARTERIOVENOUS (AV) FISTULA CREATION FIRST STAGE;  Surgeon: Marty Heck, MD;  Location: [place];  Service: Vascular;  Laterality: Left;   BASCILIC VEIN TRANSPOSITION Left 05/01/2019   Procedure: LEFT SECOND STAGE [place].;  Surgeon: Marty Heck, MD;  Location: [place];  Service: Vascular;  Laterality: Left;   BIOPSY  03/09/2021   Procedure: BIOPSY;  Surgeon: Lavena Bullion, DO;  [facility];  Service: Gastroenterology;;   COLONOSCOPY WITH PROPOFOL N/A 03/09/2021   Procedure: COLONOSCOPY WITH PROPOFOL;  Surgeon: Lavena Bullion, DO;  [facility];  Service: Gastroenterology;  Laterality: N/A;   CYSTOSCOPY/URETEROSCOPY/HOLMIUM LASER/STENT PLACEMENT Right 07/17/2018   Procedure: CYSTOSCOPY/RETROGRADE/URETEROSCOPY/HOLMIUM LASER/STENT PLACEMENT/ CYSTOLITHALOPAXY;  Surgeon: Ceasar Mons, MD;  [facility];  Service: Urology;  Laterality: Right;   GASTRIC BYPASS  01/2012   IR CATHETER TUBE CHANGE  08/05/2020   IR CATHETER TUBE CHANGE  09/07/2020   IR CATHETER TUBE CHANGE  10/05/2020   IR PARACENTESIS  07/13/2020   IR PARACENTESIS  09/07/2020   IR PARACENTESIS  09/15/2020   IR PARACENTESIS  10/11/2020   IR PARACENTESIS  10/28/2020   IR PARACENTESIS  11/16/2020   IR PARACENTESIS  12/07/2020   IR PARACENTESIS  12/21/2020   IR PARACENTESIS  01/10/2021   IR PARACENTESIS  01/27/2021   IR PARACENTESIS  02/11/2021   IR PARACENTESIS  03/01/2021   IR PARACENTESIS  03/24/2021   IR PARACENTESIS  04/08/2021   IR PARACENTESIS  04/25/2021   IR PARACENTESIS  05/24/2021   IR PARACENTESIS  06/21/2021   IR PARACENTESIS  08/08/2021   IR PARACENTESIS  10/14/2021   IR PARACENTESIS  11/08/2021   IR PARACENTESIS  12/06/2021   IR PARACENTESIS  01/26/2022   IR PARACENTESIS  02/16/2022   KNEE ARTHROSCOPY W/ MENISCAL REPAIR Right 05/2008   LAPAROSCOPIC INGUINAL HERNIA REPAIR Left 03-24-2015  @ [facility]   MITRAL VALVE REPAIR  Right 06/29/2016   Procedure: MINIMALLY INVASIVE MITRAL VALVE REPAIR  (MVR);  Surgeon: [name], MD;  Location: [place];  Service: Open Heart Surgery;  Laterality: Right;   PERITONEAL CATHETER INSERTION  08-18-2019  '@[facility]'$    AND OPEN RIGHT INGUINAL HERNIA REPAIR   PERITONEAL CATHETER REMOVAL  03-31-2020  '@[facility]'$    RIGHT HEART CATH N/A 10/19/2020   Procedure: RIGHT HEART CATH;  Surgeon: Larey Dresser, MD;  Location: [place] [facility];  Service: Cardiovascular;  Laterality: N/A;   RIGHT/LEFT HEART CATH AND CORONARY ANGIOGRAPHY N/A 06/07/2016   Procedure: Right/Left Heart Cath and Coronary Angiography;  Surgeon: Larey Dresser, MD;  Location: [place]  CV LAB;  Service: Cardiovascular;  Laterality: N/A;   TEE WITHOUT CARDIOVERSION N/A 05/04/2016   Procedure: TRANSESOPHAGEAL ECHOCARDIOGRAM (TEE);  Surgeon: Larey Dresser, MD;  Location: [facility]COPY;  Service: Cardiovascular;  Laterality: N/A;   TEE WITHOUT CARDIOVERSION N/A 06/29/2016   Procedure: TRANSESOPHAGEAL ECHOCARDIOGRAM (TEE);  Surgeon: [name], MD;  Location: [place];  Service: Open Heart Surgery;  Laterality: N/A;   TEE WITHOUT CARDIOVERSION N/A 03/06/2019   Procedure: TRANSESOPHAGEAL ECHOCARDIOGRAM (TEE);  Surgeon: Larey Dresser, MD;  Location: [facility] END[facility]COPY;  Service: Cardiovascular;  Laterality: N/A;   TRANSURETHRAL RESECTION OF PROSTATE N/A 05/25/2020   Procedure: TRANSURETHRAL RESECTION OF THE PROSTATE (TURP) WITH CYSTOLITHOLAPAXY;  Surgeon: Ceasar Mons, MD;  [facility];  Service: Urology;  Laterality: N/A;   UMBILICAL HERNIA REPAIR  09-05-2012  '@[facility]'$    URETHROPLASTY RECONSTRUCTION / REPAIR PROSTATIC / MEMBRANOUS URETHRA  01/13/2021   [facility]       Home Medications    Prior to Admission medications   Medication Sig Start Date End Date Taking? Authorizing Provider  Albuterol Sulfate (PROAIR RESPICLICK) [id number] (90 Base) MCG/ACT AEPB Inhale 2 puffs into the lungs every 6 (six) hours as needed  (wheezing/shortness of breath).    [provider]  aspirin EC 81 MG tablet Take 81 mg by mouth daily. Swallow whole.    [provider]  B Complex-C-Zn-Folic Acid (DIALYVITE Q000111Q WITH ZINC) 0.8 MG TABS Take 1 tablet by mouth daily with supper. 07/27/20   [provider]  calcium acetate (PHOSLO) 667 MG capsule Take 2,668 mg by mouth 3 (three) times daily with meals. 11/22/19   [provider]  carvedilol (COREG) 3.125 MG tablet Take 1 tablet (3.125 mg total) by mouth 2 (two) times daily with a meal. Patient taking differently: Take 3.125 mg by mouth every evening. 10/11/20   Larey Dresser, MD  Febuxostat 80 MG TABS Take 80 mg by mouth in the morning.    [provider]  furosemide (LASIX) 80 MG tablet Take 80 mg by mouth 2 (two) times daily. 06/30/20   [provider]  lactulose (CHRONULAC) 10 GM/15ML solution Take 10 g by mouth 2 (two) times daily as needed for mild constipation.    [provider]  lidocaine-prilocaine (EMLA) cream Apply 1 application topically daily as needed (prior to port being access). 07/27/20   [provider]  methocarbamol (ROBAXIN) 500 MG tablet Take 1 tablet (500 mg total) by mouth 2 (two) times daily. 10/29/21   Jaynee Eagles, PA-C  midodrine (PROAMATINE) 10 MG tablet Take 10 mg by mouth every Monday, Wednesday, and Friday with hemodialysis. 12/31/20   [provider]  polyethylene glycol (MIRALAX / GLYCOLAX) 17 g packet Take 17 g by mouth every other day.    [provider]  predniSONE (DELTASONE) 10 MG tablet Take 3 tablets (30 mg total) by mouth daily with breakfast. 10/29/21   Jaynee Eagles, PA-C  tamsulosin (FLOMAX) 0.4 MG CAPS capsule Take 0.4 mg by mouth in the morning.    [provider]  VYVANSE 30 MG capsule Take 30 mg by mouth every morning. 06/21/19   [provider]    Family History Family History  Problem Relation Age of Onset   Diabetes Mother     Hypertension Mother    Heart failure Mother    Colon cancer Neg Hx    Esophageal cancer Neg Hx    Rectal cancer Neg Hx    Stomach cancer Neg Hx  Social History Social History   Tobacco Use   Smoking status: Never   Smokeless tobacco: Never  Vaping Use   Vaping Use: Never used  Substance Use Topics   Alcohol use: Not Currently   Drug use: Never     Allergies   Chlorhexidine and Isosorbide   Review of Systems Review of Systems  Skin:        Draining wound to right groin     Physical Exam Triage Vital Signs ED Triage Vitals  Enc Vitals Group     BP 02/25/22 1253 124/79     Pulse Rate 02/25/22 1253 93     Resp 02/25/22 1253 16     Temp 02/25/22 1253 98.1 F (36.7 C)     Temp Source 02/25/22 1253 Oral     SpO2 02/25/22 1253 92 %     Weight --      Height --      Head Circumference --      Peak Flow --      Pain Score 02/25/22 1315 0     Pain Loc --      Pain Edu? --      Excl. in [place]? --    No data found.  Updated Vital Signs BP 124/79 (BP Location: Right Arm)   Pulse 93   Temp 98.1 F (36.7 C) (Oral)   Resp 16   SpO2 92%   Visual Acuity Right Eye Distance:   Left Eye Distance:   Bilateral Distance:    Right Eye Near:   Left Eye Near:    Bilateral Near:     Physical Exam Vitals and nursing note reviewed.  Constitutional:      Appearance: Normal appearance.  HENT:     Head: Normocephalic and atraumatic.  Eyes:     Pupils: Pupils are equal, round, and reactive to light.  Cardiovascular:     Rate and Rhythm: Normal rate.  Pulmonary:     Effort: Pulmonary effort is normal.  Skin:    General: Skin is warm and dry.       Neurological:     General: No focal deficit present.     Mental Status: He is alert and oriented to person, place, and time.  Psychiatric:        Mood and Affect: Mood normal.        Behavior: Behavior normal.      UC Treatments / Results  Labs (all labs ordered are listed, but only abnormal results are  displayed) Labs Reviewed - No data to display  EKG   Radiology No results found.  Procedures Procedures (including critical care time)  Medications Ordered in UC Medications - No data to display  Initial Impression / Assessment and Plan / UC Course  I have reviewed the triage vital signs and the nursing notes.  Pertinent labs & imaging results that were available during my care of the patient were reviewed by me and considered in my medical decision making (see chart for details).     Discussed symptoms and exam with patient.  He did attempt to call surgeon's office but states there was no on-call option. Will apply pressure dressing and advise he continue his doxycycline.  Discussed if drainage does not improve over the next hour and or it worsens he needs to go to the emergency room ASAP for evaluation.  Patient verbalized understanding and is in agreement with plan Final Clinical Impressions(s) / UC Diagnoses   Final diagnoses:  Wound of right groin, initial encounter  Lymphocele after surgical procedure     Discharge Instructions      Keep pressure dressing in place and continue doxycycline as prescribed Please go to the ER if your drainage does not improve and/or worsens over the next hour or you develop any symptoms including fever, chills, or any new concerns that arise     ED Prescriptions   None    PDMP not reviewed this encounter.   Melynda Ripple, NP 02/25/22 1331

## 2022-02-25 NOTE — Discharge Instructions (Signed)
Keep pressure dressing in place and continue doxycycline as prescribed Please go to the ER if your drainage does not improve and/or worsens over the next hour or you develop any symptoms including fever, chills, or any new concerns that arise

## 2022-02-25 NOTE — ED Triage Notes (Signed)
Pt states on 02/15/22 he had an open biopsy /excision of inguinofemoral nodes. Pt states he now has yellow/ white drainage coming from biopsy site. The patient states he is on day 3 of Doxycycline   -The patient is a Dialysis pt and has a left arm fistula.

## 2022-02-28 DIAGNOSIS — L299 Pruritus, unspecified: Secondary | ICD-10-CM | POA: Diagnosis not present

## 2022-02-28 DIAGNOSIS — N186 End stage renal disease: Secondary | ICD-10-CM | POA: Diagnosis not present

## 2022-02-28 DIAGNOSIS — E8770 Fluid overload, unspecified: Secondary | ICD-10-CM | POA: Diagnosis not present

## 2022-02-28 DIAGNOSIS — Z992 Dependence on renal dialysis: Secondary | ICD-10-CM | POA: Diagnosis not present

## 2022-02-28 DIAGNOSIS — D509 Iron deficiency anemia, unspecified: Secondary | ICD-10-CM | POA: Diagnosis not present

## 2022-02-28 DIAGNOSIS — N2581 Secondary hyperparathyroidism of renal origin: Secondary | ICD-10-CM | POA: Diagnosis not present

## 2022-02-28 DIAGNOSIS — D689 Coagulation defect, unspecified: Secondary | ICD-10-CM | POA: Diagnosis not present

## 2022-03-01 DIAGNOSIS — D509 Iron deficiency anemia, unspecified: Secondary | ICD-10-CM | POA: Diagnosis not present

## 2022-03-01 DIAGNOSIS — L299 Pruritus, unspecified: Secondary | ICD-10-CM | POA: Diagnosis not present

## 2022-03-01 DIAGNOSIS — D689 Coagulation defect, unspecified: Secondary | ICD-10-CM | POA: Diagnosis not present

## 2022-03-01 DIAGNOSIS — N186 End stage renal disease: Secondary | ICD-10-CM | POA: Diagnosis not present

## 2022-03-01 DIAGNOSIS — E8770 Fluid overload, unspecified: Secondary | ICD-10-CM | POA: Diagnosis not present

## 2022-03-01 DIAGNOSIS — Z992 Dependence on renal dialysis: Secondary | ICD-10-CM | POA: Diagnosis not present

## 2022-03-01 DIAGNOSIS — N2581 Secondary hyperparathyroidism of renal origin: Secondary | ICD-10-CM | POA: Diagnosis not present

## 2022-03-02 DIAGNOSIS — I129 Hypertensive chronic kidney disease with stage 1 through stage 4 chronic kidney disease, or unspecified chronic kidney disease: Secondary | ICD-10-CM | POA: Diagnosis not present

## 2022-03-02 DIAGNOSIS — T8189XA Other complications of procedures, not elsewhere classified, initial encounter: Secondary | ICD-10-CM | POA: Diagnosis not present

## 2022-03-02 DIAGNOSIS — Z992 Dependence on renal dialysis: Secondary | ICD-10-CM | POA: Diagnosis not present

## 2022-03-02 DIAGNOSIS — N186 End stage renal disease: Secondary | ICD-10-CM | POA: Diagnosis not present

## 2022-03-03 DIAGNOSIS — N2581 Secondary hyperparathyroidism of renal origin: Secondary | ICD-10-CM | POA: Diagnosis not present

## 2022-03-03 DIAGNOSIS — D509 Iron deficiency anemia, unspecified: Secondary | ICD-10-CM | POA: Diagnosis not present

## 2022-03-03 DIAGNOSIS — D631 Anemia in chronic kidney disease: Secondary | ICD-10-CM | POA: Diagnosis not present

## 2022-03-03 DIAGNOSIS — L299 Pruritus, unspecified: Secondary | ICD-10-CM | POA: Diagnosis not present

## 2022-03-03 DIAGNOSIS — D689 Coagulation defect, unspecified: Secondary | ICD-10-CM | POA: Diagnosis not present

## 2022-03-03 DIAGNOSIS — Z992 Dependence on renal dialysis: Secondary | ICD-10-CM | POA: Diagnosis not present

## 2022-03-03 DIAGNOSIS — E875 Hyperkalemia: Secondary | ICD-10-CM | POA: Diagnosis not present

## 2022-03-03 DIAGNOSIS — N186 End stage renal disease: Secondary | ICD-10-CM | POA: Diagnosis not present

## 2022-03-06 DIAGNOSIS — Z992 Dependence on renal dialysis: Secondary | ICD-10-CM | POA: Diagnosis not present

## 2022-03-06 DIAGNOSIS — D509 Iron deficiency anemia, unspecified: Secondary | ICD-10-CM | POA: Diagnosis not present

## 2022-03-06 DIAGNOSIS — D689 Coagulation defect, unspecified: Secondary | ICD-10-CM | POA: Diagnosis not present

## 2022-03-06 DIAGNOSIS — N186 End stage renal disease: Secondary | ICD-10-CM | POA: Diagnosis not present

## 2022-03-06 DIAGNOSIS — L299 Pruritus, unspecified: Secondary | ICD-10-CM | POA: Diagnosis not present

## 2022-03-06 DIAGNOSIS — D631 Anemia in chronic kidney disease: Secondary | ICD-10-CM | POA: Diagnosis not present

## 2022-03-06 DIAGNOSIS — E875 Hyperkalemia: Secondary | ICD-10-CM | POA: Diagnosis not present

## 2022-03-06 DIAGNOSIS — N2581 Secondary hyperparathyroidism of renal origin: Secondary | ICD-10-CM | POA: Diagnosis not present

## 2022-03-07 ENCOUNTER — Ambulatory Visit (HOSPITAL_COMMUNITY)
Admission: RE | Admit: 2022-03-07 | Discharge: 2022-03-07 | Disposition: A | Discharge status: Home / Self Care | Payer: 59 | Source: Ambulatory Visit | Attending: Cardiology | Admitting: Cardiology

## 2022-03-07 ENCOUNTER — Encounter (HOSPITAL_COMMUNITY): Payer: Self-pay | Admitting: Cardiology

## 2022-03-07 VITALS — BP 102/84 | HR 87 | Ht 69.0 in | Wt 183.4 lb

## 2022-03-07 DIAGNOSIS — I132 Hypertensive heart and chronic kidney disease with heart failure and with stage 5 chronic kidney disease, or end stage renal disease: Secondary | ICD-10-CM | POA: Insufficient documentation

## 2022-03-07 DIAGNOSIS — Z9079 Acquired absence of other genital organ(s): Secondary | ICD-10-CM | POA: Diagnosis not present

## 2022-03-07 DIAGNOSIS — Z79899 Other long term (current) drug therapy: Secondary | ICD-10-CM | POA: Insufficient documentation

## 2022-03-07 DIAGNOSIS — E785 Hyperlipidemia, unspecified: Secondary | ICD-10-CM | POA: Insufficient documentation

## 2022-03-07 DIAGNOSIS — I052 Rheumatic mitral stenosis with insufficiency: Secondary | ICD-10-CM | POA: Diagnosis not present

## 2022-03-07 DIAGNOSIS — Z8673 Personal history of transient ischemic attack (TIA), and cerebral infarction without residual deficits: Secondary | ICD-10-CM | POA: Insufficient documentation

## 2022-03-07 DIAGNOSIS — J4489 Other specified chronic obstructive pulmonary disease: Secondary | ICD-10-CM | POA: Diagnosis not present

## 2022-03-07 DIAGNOSIS — Z7682 Awaiting organ transplant status: Secondary | ICD-10-CM | POA: Diagnosis not present

## 2022-03-07 DIAGNOSIS — I5022 Chronic systolic (congestive) heart failure: Secondary | ICD-10-CM

## 2022-03-07 DIAGNOSIS — I5023 Acute on chronic systolic (congestive) heart failure: Secondary | ICD-10-CM | POA: Insufficient documentation

## 2022-03-07 DIAGNOSIS — N186 End stage renal disease: Secondary | ICD-10-CM | POA: Diagnosis not present

## 2022-03-07 LAB — LIPID PANEL
Cholesterol: 182 mg/dL (ref 0–200)
HDL: 42 mg/dL (ref >40)
LDL Cholesterol: 116 mg/dL — ABNORMAL HIGH (ref 0–99)
Total CHOL/HDL Ratio: 4.3 RATIO
Triglycerides: 120 mg/dL (ref <150)
VLDL: 24 mg/dL (ref 0–40)

## 2022-03-07 NOTE — Patient Instructions (Signed)
There has been no changes to your medications.  Labs done today, your results will be available in MyChart, we will contact you for abnormal readings.  Your physician recommends that you schedule a follow-up appointment in: 1 year ( March 2025) ** please call the office in January 2025 to arrange your follow up appointment. **  If you have any questions or concerns before your next appointment please send [place] a message through [place] or call our office at [phone].    TO LEAVE A MESSAGE FOR THE NURSE SELECT OPTION 2, PLEASE LEAVE A MESSAGE INCLUDING: YOUR NAME DATE OF BIRTH CALL BACK NUMBER REASON FOR CALL**this is important as we prioritize the call backs  YOU WILL RECEIVE A CALL BACK THE SAME DAY AS LONG AS YOU CALL BEFORE 4:00 PM  At the [facility], you and your health needs are our priority. As part of our continuing mission to provide you with exceptional heart care, we have created designated Provider Care Teams. These Care Teams include your primary Cardiologist (physician) and Advanced Practice Providers (APPs- Physician Assistants and Nurse Practitioners) who all work together to provide you with the care you need, when you need it.   You may see any of the following providers on your designated Care Team at your next follow up: Dr Glori Bickers Dr Loralie Champagne Dr. Roxana Hires, NP Lyda Jester, [place] [facility] [place], [place] Forestine Na, NP Audry Riles, PharmD   Please be sure to bring in all your medications bottles to every appointment.    Thank you for choosing [facility]

## 2022-03-07 NOTE — Progress Notes (Signed)
Heart Failure  Note  ID:  Vernon, Blackburn 24-Feb-1965, MRN [id number] Provider location: [facility] Type of Visit: Established patient   PCP:  [name], MD  Cardiologist:  [name]   History of Present Illness: Vernon Blackburn is a 57 y.o. male who has a history of long-standing, poorly-controlled HTN, CKD stage 3-4, chronic systolic CHF and severe mitral regurgitation.  He presents for followup of CHF and HTN.  He has had hypertension for years.  He has had known CKD, followed by Dr. [name].  He developed severe dyspnea in 12/17 and was admitted with acute systolic CHF.  Echo showed EF 20-25% with severe MR.  He was diuresed and discharged.   TEE was done in 5/18 to assess mitral valve.  There was severe MR.  The MV was thickened and did not coapt well.  Suspect there was a component of secondary MR with moderate LV dilation, however the thickened leaflets suggested a component of primary MR as well.   He had right and left heart cath in 6/18.  No significant CAD, elevated filling pressures.    In 6/18, he had mitral valve repair.  Post-op echo in 7/18 showed EF 25-30% with stable MV repair (mild MR, mean gradient 4 mmHg).  Echo 12/18 with EF 30-35%, moderate LV dilation, s/p MVR repair with trivial MR.  Echo in 4/19 showed EF up to 40-45% with stable repaired mitral valve.   He had an echo in 8/20 with EF 35%, mild LV dilation, s/p MV repair with mean gradient 4 mmHg and trivial MR, mildly decreased RV systolic function.   He was admitted in 2/21 to [facility] with worsening renal function and HD was started.  Echo at [facility] in 2/21 showed EF 40-45%, mild LV dilation, moderate LVH, "severe mitral stenosis" with mean gradient 7 mmHg and eccentric mild MR.  Head imagining showed an old CVA.   TEE was done to followup on ?severe mitral stenosis in 3/21.  This showed EF 45%, moderate LVH, global mild hypokinesis, mildly decreased RV systolic function; s/p MV repair,  mild MR with mean gradient 4 mmHg, no significant mitral stenosis.  CPX in 11/21 showed peak VO2 17.7 (51% predicted), RER 1.12, VE/VCO2 slope 29.  Moderate functional limitation due to mixed restrictive/obstructive lung disease.  Echo in 7/22 showed EF 50-55%, s/p MV repair with trivial MR and mean gradient 5 mmHg.   Patient was admitted in 7/22 with abdominal distention and found to have ascites, cause still uncertain (unless simply due to inadequate dialysis prescription).  No evidence for cirrhosis by CT. He had paracentesis. He additionally had TURP for enlarged prostate and has ended up with a suprapubic catheter.    [place] in 10/22 to assess RV function in the setting of ascites of uncertain etiology showed normal right and left heart filling pressures, normal PA pressure, and normal cardiac output.  He has been seen by GI and continues to require paracenteses.  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   Echo in 1/24 showed EF 50%, mild LVH, mild global hypokinesis, mildly decreased RV systolic function with mild RV enlargement, s/p mitral valve repair with mild MR.  Cardiolite in 1/24 showed no ischemia/infarction.   He returns today for followup of CHF.  He occasionally gets low BP still at HD, takes midodrine pre-HD and holds Coreg on the day of HD.  No dyspnea  walking on flat ground or doing usual ADLs.  No chest pain.  No orthopnea/PND.  No lightheadedness.  He has been listed for kidney transplant at [facility] and he is working on getting listed also at [facility].  He recently had a surgery on a mass in his right groin initially thought to be an inguinal hernia but found to be an enlarged lymph node.  Pathology negative for malignancy, but he ended up requiring a wound vac.   ECG (personally reviewed): NSR, normal  Labs (4/18): K 3.7, creatinine 3.05, hgb 10.4 Labs (5/18): K 3.3, creatinine 2.89, BNP 789 Labs (6/18): K 3.4, creatinine 2.4, hgb  10.1 Labs (7/18): K 3.2, creatinine 2.27, BNP 303 Labs (9/18): LDL 78, HDL 56, K 3.8, creatinine 2.16 Labs (10/18): hgb 10.1 Labs (2/19): K 3.3, creatinine 2.75, hgb 12.1 Labs (5/19): K 3.6, creatinine 3.07 Labs (7/20): K 4, creatinine 3.55, hgb 12.8 Labs (2/21): LDL 127 Labs (7/22): hgb 10.4  PMH: 1. Gout 2. HTN: Long-standing, poor control. 3. ESRD.  Likely hypertensive nephropathy.  4. Dilated cardiomyopathy: May be due to long-standing HTN.  - Echo (1/18): EF 20-25%, severe MR - Echo (2/18): EF 25%, severe MR.  - TEE (5/18): Moderate LV dilation with severe global hypokinesis, EF 25-30%, normal RV size with mildly decreased systolic function, RV-RA gradient 55 mmHg, severe MR with mildly thickened leaflets with inadequate coaptation and ERO 0.52 cm^2 by PISA and systolic flow reversal in the pulmonary vein doppler pattern => secondary MR from dilated LV but thickened leaflets lead to concern for component of primary MR as well.  - RHC/LHC (6/18): No significant CAD.  Mean RA 15, PA 76/31 mean 48, mean PCWP 45, CI 3.02 Fick, 2.64 thermo.  - Echo (7/18): EF 25-30%, mild dilation, diffuse hypokinesis, s/p MV repair with mean gradient 4 mmHg and mild MR, PASP 37 mmHg, normal RV size and systolic function.  - Echo (12/18): EF 30-35%, moderate LV dilation with diffuse hypokinesis, stable MV repair.  - Echo (4/19): EF 40-45%, severe LV dilation, s/p mitral valve repair with mean gradient 4 mmHg, no mitral regurgitation, severe RV dilation with normal systolic function.  - Echo (8/20): EF 35%, mild LV dilation, s/p MV repair with mean gradient 4 mmHg and trivial MR, mildly decreased RV systolic function.  - Echo (2/21, [facility]):  EF 40-45%, mild LV dilation, moderate LVH, "severe mitral stenosis" with mean gradient 7 mmHg and eccentric mild MR. - TEE (3/21): EF 45%, moderate LVH, global mild hypokinesis, mildly decreased RV systolic function; s/p MV repair, mild MR with mean gradient 4 mmHg, no  significant mitral stenosis.  - CPX (11/21): peak VO2 17.7 (51% predicted), RER 1.12, VE/VCO2 slope 29.  Moderate functional limitation due to mixed restrictive/obstructive lung disease.  - Echo (7/22): EF 50-55%, s/p MV repair with trivial MR and mean gradient 5 mmHg.  - RHC (10/22): mean RA 1, PA 17/2, mean PCWP 2, CI 3.3 - Echo (1/24): EF 50%, mild LVH, mild global hypokinesis, mildly decreased RV systolic function with mild RV enlargement, s/p mitral valve repair with mild MR.  - Cardiolite (1/24): EF 45%, no ischemia/infarction.  5. Severe MR: S/p MV repair in 6/18. 6. H/o CVA 7. BPH: S/p TURP, now with suprapubic catheter.  8. H/o ascites (7/22) with paracenteses.   SH: Married, lives in [place], nonsmoker, no drugs, occasional ETOH.  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   Family History  Problem Relation Age of Onset   Diabetes  Mother    Hypertension Mother    Heart failure Mother    Colon cancer Neg Hx    Esophageal cancer Neg Hx    Rectal cancer Neg Hx    Stomach cancer Neg Hx    ROS: All systems reviewed and negative except as per HPI.   Current Outpatient Medications  Medication Sig Dispense Refill   Albuterol Sulfate (PROAIR RESPICLICK) 123XX123 (90 Base) MCG/ACT AEPB Inhale 2 puffs into the lungs every 6 (six) hours as needed (wheezing/shortness of breath).     aspirin EC 81 MG tablet Take 81 mg by mouth daily. Swallow whole.     betamethasone 0.5% cream-vitamin a&d ointment 1:1 mixture Apply topically.     calcium acetate (PHOSLO) 667 MG capsule Take 2,668 mg by mouth 3 (three) times daily with meals.     carvedilol (COREG) 3.125 MG tablet Take 1 tablet (3.125 mg total) by mouth 2 (two) times daily with a meal. (Patient taking differently: Take 3.125 mg by mouth every evening.) 180 tablet 3   lactulose (CHRONULAC) 10 GM/15ML solution Take 10 g by mouth 2 (two) times daily as needed for mild constipation.     midodrine (PROAMATINE) 10 MG tablet  Take 10 mg by mouth every Monday, Wednesday, and Friday with hemodialysis.     polyethylene glycol (MIRALAX / GLYCOLAX) 17 g packet Take 17 g by mouth every other day.     lidocaine-prilocaine (EMLA) cream Apply 1 application topically daily as needed (prior to port being access). (Patient not taking: Reported on 03/07/2022)     VYVANSE 30 MG capsule Take 30 mg by mouth every morning.     No current facility-administered medications for this encounter.   Exam:   BP 102/84   Pulse 87   Ht '5\' 9"'$  (1.753 m)   Wt 83.2 kg (183 lb 6.4 oz)   SpO2 98%   BMI 27.08 kg/m  General: NAD Neck: No JVD, no thyromegaly or thyroid nodule.  Lungs: Clear to auscultation bilaterally with normal respiratory effort. CV: Nondisplaced PMI.  Heart regular S1/S2, no S3/S4, no murmur.  No peripheral edema.  No carotid bruit.  Normal pedal pulses.  Abdomen: Soft, nontender, no hepatosplenomegaly, no distention.  Skin: Intact without lesions or rashes.  Neurologic: Alert and oriented x 3.  Psych: Normal affect. Extremities: No clubbing or cyanosis. Wound vac right groin.  HEENT: Normal.   Assessment/Plan: 1. HTN: Possible hypertensive cardiomyopathy and hypertensive nephropathy.  BP now running low.  2. ESRD: He was switched from PD to HD.  He is on the transplant list at [facility].  - Trying to get listed also at [facility].  From a cardiac standpoint, I see no contraindications.  3. Chronic systolic CHF: Echo in 123456 with EF 25-30%. Nonischemic cardiomyopathy based on cath in 6/18.  Cause of cardiomyopathy may be long-standing, poorly controlled HTN.  NYHA class II symptoms.  He improved symptomatically s/p MV repair.  Echo in 4/19 with EF 40-45%. Echo at [facility] in 2/21 showed EF 40-45%.   TEE in 3/21 with EF 45%.  Echo in 5/22 with EF up to 50-55%.  Echo in 1/24 showed EF 50%, mild LVH, mild global hypokinesis, mildly decreased RV systolic function with mild RV enlargement, s/p mitral valve repair with mild MR. RHC in 10/22 was  normal, his ascites cannot be explained by RV failure.  He is not volume overloaded on exam.  NYHA class II symptoms.  - Continue Coreg 3.125 mg bid on non-HD days.  -  BP unlikely to tolerate further GDMT.  4. Mitral regurgitation: s/p MV repair for severe MR.  Much improved symptomatically s/p surgery. Echo at [facility] in 2/21 suggested "severe" mitral stenosis with mean gradient 7 mmHg and eccentric MR, ?mild.  However, TEE here in 3/21 showed stable MV repair with mild MR and no significant mitral stenosis.  Echo in 1/24 showed stable repaired mitral valve with no more than mild stenosis and mild regurgitation.  - Will need abx with dental work given prosthetic material.  5. Asthma: Occasional wheezing, not exertional.  Abnormal CPX in the past suggesting lung pathology. 6. CVA: Prior CVA by head imaging.   7. Hyperlipidemia: Continue atorvastatin, check lipids today.   8. Ascites: No cirrhosis on liver imaging and RHC did not show RV failure.  He has required therapeutic paracenteses in the past.  I  Followup in 1 year.   Signed, Loralie Champagne, MD  03/07/2022  [facility] [address] [facility] [place] [address] [phone] (office) [phone] (fax)

## 2022-03-08 DIAGNOSIS — D509 Iron deficiency anemia, unspecified: Secondary | ICD-10-CM | POA: Diagnosis not present

## 2022-03-08 DIAGNOSIS — Z992 Dependence on renal dialysis: Secondary | ICD-10-CM | POA: Diagnosis not present

## 2022-03-08 DIAGNOSIS — N186 End stage renal disease: Secondary | ICD-10-CM | POA: Diagnosis not present

## 2022-03-08 DIAGNOSIS — E875 Hyperkalemia: Secondary | ICD-10-CM | POA: Diagnosis not present

## 2022-03-08 DIAGNOSIS — D689 Coagulation defect, unspecified: Secondary | ICD-10-CM | POA: Diagnosis not present

## 2022-03-08 DIAGNOSIS — N2581 Secondary hyperparathyroidism of renal origin: Secondary | ICD-10-CM | POA: Diagnosis not present

## 2022-03-08 DIAGNOSIS — D631 Anemia in chronic kidney disease: Secondary | ICD-10-CM | POA: Diagnosis not present

## 2022-03-08 DIAGNOSIS — L299 Pruritus, unspecified: Secondary | ICD-10-CM | POA: Diagnosis not present

## 2022-03-10 DIAGNOSIS — E875 Hyperkalemia: Secondary | ICD-10-CM | POA: Diagnosis not present

## 2022-03-10 DIAGNOSIS — D689 Coagulation defect, unspecified: Secondary | ICD-10-CM | POA: Diagnosis not present

## 2022-03-10 DIAGNOSIS — N2581 Secondary hyperparathyroidism of renal origin: Secondary | ICD-10-CM | POA: Diagnosis not present

## 2022-03-10 DIAGNOSIS — D631 Anemia in chronic kidney disease: Secondary | ICD-10-CM | POA: Diagnosis not present

## 2022-03-10 DIAGNOSIS — D509 Iron deficiency anemia, unspecified: Secondary | ICD-10-CM | POA: Diagnosis not present

## 2022-03-10 DIAGNOSIS — L299 Pruritus, unspecified: Secondary | ICD-10-CM | POA: Diagnosis not present

## 2022-03-10 DIAGNOSIS — Z992 Dependence on renal dialysis: Secondary | ICD-10-CM | POA: Diagnosis not present

## 2022-03-10 DIAGNOSIS — N186 End stage renal disease: Secondary | ICD-10-CM | POA: Diagnosis not present

## 2022-03-13 DIAGNOSIS — D631 Anemia in chronic kidney disease: Secondary | ICD-10-CM | POA: Diagnosis not present

## 2022-03-13 DIAGNOSIS — N186 End stage renal disease: Secondary | ICD-10-CM | POA: Diagnosis not present

## 2022-03-13 DIAGNOSIS — D509 Iron deficiency anemia, unspecified: Secondary | ICD-10-CM | POA: Diagnosis not present

## 2022-03-13 DIAGNOSIS — E875 Hyperkalemia: Secondary | ICD-10-CM | POA: Diagnosis not present

## 2022-03-13 DIAGNOSIS — D689 Coagulation defect, unspecified: Secondary | ICD-10-CM | POA: Diagnosis not present

## 2022-03-13 DIAGNOSIS — L299 Pruritus, unspecified: Secondary | ICD-10-CM | POA: Diagnosis not present

## 2022-03-13 DIAGNOSIS — Z992 Dependence on renal dialysis: Secondary | ICD-10-CM | POA: Diagnosis not present

## 2022-03-13 DIAGNOSIS — N2581 Secondary hyperparathyroidism of renal origin: Secondary | ICD-10-CM | POA: Diagnosis not present

## 2022-03-15 DIAGNOSIS — D509 Iron deficiency anemia, unspecified: Secondary | ICD-10-CM | POA: Diagnosis not present

## 2022-03-15 DIAGNOSIS — L299 Pruritus, unspecified: Secondary | ICD-10-CM | POA: Diagnosis not present

## 2022-03-15 DIAGNOSIS — E875 Hyperkalemia: Secondary | ICD-10-CM | POA: Diagnosis not present

## 2022-03-15 DIAGNOSIS — N2581 Secondary hyperparathyroidism of renal origin: Secondary | ICD-10-CM | POA: Diagnosis not present

## 2022-03-15 DIAGNOSIS — N186 End stage renal disease: Secondary | ICD-10-CM | POA: Diagnosis not present

## 2022-03-15 DIAGNOSIS — D631 Anemia in chronic kidney disease: Secondary | ICD-10-CM | POA: Diagnosis not present

## 2022-03-15 DIAGNOSIS — Z992 Dependence on renal dialysis: Secondary | ICD-10-CM | POA: Diagnosis not present

## 2022-03-15 DIAGNOSIS — D689 Coagulation defect, unspecified: Secondary | ICD-10-CM | POA: Diagnosis not present

## 2022-03-16 DIAGNOSIS — L2089 Other atopic dermatitis: Secondary | ICD-10-CM | POA: Diagnosis not present

## 2022-03-16 DIAGNOSIS — L810 Postinflammatory hyperpigmentation: Secondary | ICD-10-CM | POA: Diagnosis not present

## 2022-03-17 DIAGNOSIS — N2581 Secondary hyperparathyroidism of renal origin: Secondary | ICD-10-CM | POA: Diagnosis not present

## 2022-03-17 DIAGNOSIS — N186 End stage renal disease: Secondary | ICD-10-CM | POA: Diagnosis not present

## 2022-03-17 DIAGNOSIS — D631 Anemia in chronic kidney disease: Secondary | ICD-10-CM | POA: Diagnosis not present

## 2022-03-17 DIAGNOSIS — E875 Hyperkalemia: Secondary | ICD-10-CM | POA: Diagnosis not present

## 2022-03-17 DIAGNOSIS — Z992 Dependence on renal dialysis: Secondary | ICD-10-CM | POA: Diagnosis not present

## 2022-03-17 DIAGNOSIS — D509 Iron deficiency anemia, unspecified: Secondary | ICD-10-CM | POA: Diagnosis not present

## 2022-03-17 DIAGNOSIS — D689 Coagulation defect, unspecified: Secondary | ICD-10-CM | POA: Diagnosis not present

## 2022-03-17 DIAGNOSIS — L299 Pruritus, unspecified: Secondary | ICD-10-CM | POA: Diagnosis not present

## 2022-03-20 DIAGNOSIS — Z992 Dependence on renal dialysis: Secondary | ICD-10-CM | POA: Diagnosis not present

## 2022-03-20 DIAGNOSIS — N186 End stage renal disease: Secondary | ICD-10-CM | POA: Diagnosis not present

## 2022-03-20 DIAGNOSIS — D631 Anemia in chronic kidney disease: Secondary | ICD-10-CM | POA: Diagnosis not present

## 2022-03-20 DIAGNOSIS — D509 Iron deficiency anemia, unspecified: Secondary | ICD-10-CM | POA: Diagnosis not present

## 2022-03-20 DIAGNOSIS — E875 Hyperkalemia: Secondary | ICD-10-CM | POA: Diagnosis not present

## 2022-03-20 DIAGNOSIS — D689 Coagulation defect, unspecified: Secondary | ICD-10-CM | POA: Diagnosis not present

## 2022-03-20 DIAGNOSIS — N2581 Secondary hyperparathyroidism of renal origin: Secondary | ICD-10-CM | POA: Diagnosis not present

## 2022-03-20 DIAGNOSIS — L299 Pruritus, unspecified: Secondary | ICD-10-CM | POA: Diagnosis not present

## 2022-03-22 DIAGNOSIS — D631 Anemia in chronic kidney disease: Secondary | ICD-10-CM | POA: Diagnosis not present

## 2022-03-22 DIAGNOSIS — D689 Coagulation defect, unspecified: Secondary | ICD-10-CM | POA: Diagnosis not present

## 2022-03-22 DIAGNOSIS — N2581 Secondary hyperparathyroidism of renal origin: Secondary | ICD-10-CM | POA: Diagnosis not present

## 2022-03-22 DIAGNOSIS — N186 End stage renal disease: Secondary | ICD-10-CM | POA: Diagnosis not present

## 2022-03-22 DIAGNOSIS — Z992 Dependence on renal dialysis: Secondary | ICD-10-CM | POA: Diagnosis not present

## 2022-03-22 DIAGNOSIS — E875 Hyperkalemia: Secondary | ICD-10-CM | POA: Diagnosis not present

## 2022-03-22 DIAGNOSIS — L299 Pruritus, unspecified: Secondary | ICD-10-CM | POA: Diagnosis not present

## 2022-03-22 DIAGNOSIS — D509 Iron deficiency anemia, unspecified: Secondary | ICD-10-CM | POA: Diagnosis not present

## 2022-03-24 DIAGNOSIS — N2581 Secondary hyperparathyroidism of renal origin: Secondary | ICD-10-CM | POA: Diagnosis not present

## 2022-03-24 DIAGNOSIS — Z992 Dependence on renal dialysis: Secondary | ICD-10-CM | POA: Diagnosis not present

## 2022-03-24 DIAGNOSIS — L299 Pruritus, unspecified: Secondary | ICD-10-CM | POA: Diagnosis not present

## 2022-03-24 DIAGNOSIS — D631 Anemia in chronic kidney disease: Secondary | ICD-10-CM | POA: Diagnosis not present

## 2022-03-24 DIAGNOSIS — D509 Iron deficiency anemia, unspecified: Secondary | ICD-10-CM | POA: Diagnosis not present

## 2022-03-24 DIAGNOSIS — D689 Coagulation defect, unspecified: Secondary | ICD-10-CM | POA: Diagnosis not present

## 2022-03-24 DIAGNOSIS — E875 Hyperkalemia: Secondary | ICD-10-CM | POA: Diagnosis not present

## 2022-03-24 DIAGNOSIS — N186 End stage renal disease: Secondary | ICD-10-CM | POA: Diagnosis not present

## 2022-03-27 DIAGNOSIS — N186 End stage renal disease: Secondary | ICD-10-CM | POA: Diagnosis not present

## 2022-03-27 DIAGNOSIS — L299 Pruritus, unspecified: Secondary | ICD-10-CM | POA: Diagnosis not present

## 2022-03-27 DIAGNOSIS — N2581 Secondary hyperparathyroidism of renal origin: Secondary | ICD-10-CM | POA: Diagnosis not present

## 2022-03-27 DIAGNOSIS — D509 Iron deficiency anemia, unspecified: Secondary | ICD-10-CM | POA: Diagnosis not present

## 2022-03-27 DIAGNOSIS — D631 Anemia in chronic kidney disease: Secondary | ICD-10-CM | POA: Diagnosis not present

## 2022-03-27 DIAGNOSIS — Z992 Dependence on renal dialysis: Secondary | ICD-10-CM | POA: Diagnosis not present

## 2022-03-27 DIAGNOSIS — D689 Coagulation defect, unspecified: Secondary | ICD-10-CM | POA: Diagnosis not present

## 2022-03-27 DIAGNOSIS — E875 Hyperkalemia: Secondary | ICD-10-CM | POA: Diagnosis not present

## 2022-03-29 DIAGNOSIS — Z992 Dependence on renal dialysis: Secondary | ICD-10-CM | POA: Diagnosis not present

## 2022-03-29 DIAGNOSIS — D689 Coagulation defect, unspecified: Secondary | ICD-10-CM | POA: Diagnosis not present

## 2022-03-29 DIAGNOSIS — N2581 Secondary hyperparathyroidism of renal origin: Secondary | ICD-10-CM | POA: Diagnosis not present

## 2022-03-29 DIAGNOSIS — D631 Anemia in chronic kidney disease: Secondary | ICD-10-CM | POA: Diagnosis not present

## 2022-03-29 DIAGNOSIS — N186 End stage renal disease: Secondary | ICD-10-CM | POA: Diagnosis not present

## 2022-03-29 DIAGNOSIS — L299 Pruritus, unspecified: Secondary | ICD-10-CM | POA: Diagnosis not present

## 2022-03-29 DIAGNOSIS — D509 Iron deficiency anemia, unspecified: Secondary | ICD-10-CM | POA: Diagnosis not present

## 2022-03-29 DIAGNOSIS — E875 Hyperkalemia: Secondary | ICD-10-CM | POA: Diagnosis not present

## 2022-03-31 DIAGNOSIS — N186 End stage renal disease: Secondary | ICD-10-CM | POA: Diagnosis not present

## 2022-03-31 DIAGNOSIS — D631 Anemia in chronic kidney disease: Secondary | ICD-10-CM | POA: Diagnosis not present

## 2022-03-31 DIAGNOSIS — N2581 Secondary hyperparathyroidism of renal origin: Secondary | ICD-10-CM | POA: Diagnosis not present

## 2022-03-31 DIAGNOSIS — D689 Coagulation defect, unspecified: Secondary | ICD-10-CM | POA: Diagnosis not present

## 2022-03-31 DIAGNOSIS — E875 Hyperkalemia: Secondary | ICD-10-CM | POA: Diagnosis not present

## 2022-03-31 DIAGNOSIS — Z992 Dependence on renal dialysis: Secondary | ICD-10-CM | POA: Diagnosis not present

## 2022-03-31 DIAGNOSIS — D509 Iron deficiency anemia, unspecified: Secondary | ICD-10-CM | POA: Diagnosis not present

## 2022-03-31 DIAGNOSIS — L299 Pruritus, unspecified: Secondary | ICD-10-CM | POA: Diagnosis not present

## 2022-04-02 DIAGNOSIS — Z992 Dependence on renal dialysis: Secondary | ICD-10-CM | POA: Diagnosis not present

## 2022-04-02 DIAGNOSIS — N186 End stage renal disease: Secondary | ICD-10-CM | POA: Diagnosis not present

## 2022-04-02 DIAGNOSIS — I129 Hypertensive chronic kidney disease with stage 1 through stage 4 chronic kidney disease, or unspecified chronic kidney disease: Secondary | ICD-10-CM | POA: Diagnosis not present

## 2022-04-04 DIAGNOSIS — D631 Anemia in chronic kidney disease: Secondary | ICD-10-CM | POA: Diagnosis not present

## 2022-04-04 DIAGNOSIS — Z992 Dependence on renal dialysis: Secondary | ICD-10-CM | POA: Diagnosis not present

## 2022-04-04 DIAGNOSIS — N2581 Secondary hyperparathyroidism of renal origin: Secondary | ICD-10-CM | POA: Diagnosis not present

## 2022-04-04 DIAGNOSIS — D509 Iron deficiency anemia, unspecified: Secondary | ICD-10-CM | POA: Diagnosis not present

## 2022-04-04 DIAGNOSIS — N186 End stage renal disease: Secondary | ICD-10-CM | POA: Diagnosis not present

## 2022-04-04 DIAGNOSIS — E875 Hyperkalemia: Secondary | ICD-10-CM | POA: Diagnosis not present

## 2022-04-04 DIAGNOSIS — L299 Pruritus, unspecified: Secondary | ICD-10-CM | POA: Diagnosis not present

## 2022-04-04 DIAGNOSIS — D689 Coagulation defect, unspecified: Secondary | ICD-10-CM | POA: Diagnosis not present

## 2022-04-05 DIAGNOSIS — Z992 Dependence on renal dialysis: Secondary | ICD-10-CM | POA: Diagnosis not present

## 2022-04-05 DIAGNOSIS — N186 End stage renal disease: Secondary | ICD-10-CM | POA: Diagnosis not present

## 2022-04-05 DIAGNOSIS — D689 Coagulation defect, unspecified: Secondary | ICD-10-CM | POA: Diagnosis not present

## 2022-04-05 DIAGNOSIS — D631 Anemia in chronic kidney disease: Secondary | ICD-10-CM | POA: Diagnosis not present

## 2022-04-05 DIAGNOSIS — N2581 Secondary hyperparathyroidism of renal origin: Secondary | ICD-10-CM | POA: Diagnosis not present

## 2022-04-05 DIAGNOSIS — L299 Pruritus, unspecified: Secondary | ICD-10-CM | POA: Diagnosis not present

## 2022-04-05 DIAGNOSIS — D509 Iron deficiency anemia, unspecified: Secondary | ICD-10-CM | POA: Diagnosis not present

## 2022-04-05 DIAGNOSIS — E875 Hyperkalemia: Secondary | ICD-10-CM | POA: Diagnosis not present

## 2022-04-07 DIAGNOSIS — E875 Hyperkalemia: Secondary | ICD-10-CM | POA: Diagnosis not present

## 2022-04-07 DIAGNOSIS — N2581 Secondary hyperparathyroidism of renal origin: Secondary | ICD-10-CM | POA: Diagnosis not present

## 2022-04-07 DIAGNOSIS — N186 End stage renal disease: Secondary | ICD-10-CM | POA: Diagnosis not present

## 2022-04-07 DIAGNOSIS — Z992 Dependence on renal dialysis: Secondary | ICD-10-CM | POA: Diagnosis not present

## 2022-04-07 DIAGNOSIS — D509 Iron deficiency anemia, unspecified: Secondary | ICD-10-CM | POA: Diagnosis not present

## 2022-04-07 DIAGNOSIS — D631 Anemia in chronic kidney disease: Secondary | ICD-10-CM | POA: Diagnosis not present

## 2022-04-07 DIAGNOSIS — D689 Coagulation defect, unspecified: Secondary | ICD-10-CM | POA: Diagnosis not present

## 2022-04-07 DIAGNOSIS — L299 Pruritus, unspecified: Secondary | ICD-10-CM | POA: Diagnosis not present

## 2022-04-10 ENCOUNTER — Other Ambulatory Visit (HOSPITAL_COMMUNITY): Payer: Self-pay | Admitting: Nephrology

## 2022-04-10 DIAGNOSIS — R188 Other ascites: Secondary | ICD-10-CM

## 2022-04-10 DIAGNOSIS — E875 Hyperkalemia: Secondary | ICD-10-CM | POA: Diagnosis not present

## 2022-04-10 DIAGNOSIS — L299 Pruritus, unspecified: Secondary | ICD-10-CM | POA: Diagnosis not present

## 2022-04-10 DIAGNOSIS — Z992 Dependence on renal dialysis: Secondary | ICD-10-CM | POA: Diagnosis not present

## 2022-04-10 DIAGNOSIS — N186 End stage renal disease: Secondary | ICD-10-CM | POA: Diagnosis not present

## 2022-04-10 DIAGNOSIS — D689 Coagulation defect, unspecified: Secondary | ICD-10-CM | POA: Diagnosis not present

## 2022-04-10 DIAGNOSIS — D509 Iron deficiency anemia, unspecified: Secondary | ICD-10-CM | POA: Diagnosis not present

## 2022-04-10 DIAGNOSIS — D631 Anemia in chronic kidney disease: Secondary | ICD-10-CM | POA: Diagnosis not present

## 2022-04-10 DIAGNOSIS — N2581 Secondary hyperparathyroidism of renal origin: Secondary | ICD-10-CM | POA: Diagnosis not present

## 2022-04-11 ENCOUNTER — Other Ambulatory Visit (HOSPITAL_COMMUNITY): Payer: Self-pay | Admitting: Nephrology

## 2022-04-11 ENCOUNTER — Ambulatory Visit (HOSPITAL_COMMUNITY)
Admission: RE | Admit: 2022-04-11 | Discharge: 2022-04-11 | Disposition: A | Discharge status: Home / Self Care | Payer: 59 | Source: Ambulatory Visit | Attending: Nephrology | Admitting: Nephrology

## 2022-04-11 DIAGNOSIS — R188 Other ascites: Secondary | ICD-10-CM

## 2022-04-11 MED ORDER — LIDOCAINE HCL 1 % IJ SOLN
INTRAMUSCULAR | Status: AC
  Filled 2022-04-11: qty 20

## 2022-04-11 NOTE — Progress Notes (Signed)
Patient presents for  therapeutic paracentesis. US limited shows trace amount of peritoneal  fluid noted  Insufficient to perform a safe paracentesis. Procedure not performed.  

## 2022-04-12 DIAGNOSIS — Z992 Dependence on renal dialysis: Secondary | ICD-10-CM | POA: Diagnosis not present

## 2022-04-12 DIAGNOSIS — L299 Pruritus, unspecified: Secondary | ICD-10-CM | POA: Diagnosis not present

## 2022-04-12 DIAGNOSIS — D631 Anemia in chronic kidney disease: Secondary | ICD-10-CM | POA: Diagnosis not present

## 2022-04-12 DIAGNOSIS — N2581 Secondary hyperparathyroidism of renal origin: Secondary | ICD-10-CM | POA: Diagnosis not present

## 2022-04-12 DIAGNOSIS — D509 Iron deficiency anemia, unspecified: Secondary | ICD-10-CM | POA: Diagnosis not present

## 2022-04-12 DIAGNOSIS — D689 Coagulation defect, unspecified: Secondary | ICD-10-CM | POA: Diagnosis not present

## 2022-04-12 DIAGNOSIS — N186 End stage renal disease: Secondary | ICD-10-CM | POA: Diagnosis not present

## 2022-04-12 DIAGNOSIS — E875 Hyperkalemia: Secondary | ICD-10-CM | POA: Diagnosis not present

## 2022-04-15 DIAGNOSIS — Z23 Encounter for immunization: Secondary | ICD-10-CM | POA: Diagnosis not present

## 2022-04-15 DIAGNOSIS — I272 Pulmonary hypertension, unspecified: Secondary | ICD-10-CM | POA: Diagnosis not present

## 2022-04-15 DIAGNOSIS — Z7682 Awaiting organ transplant status: Secondary | ICD-10-CM | POA: Diagnosis not present

## 2022-04-15 DIAGNOSIS — Z743 Need for continuous supervision: Secondary | ICD-10-CM | POA: Diagnosis not present

## 2022-04-15 DIAGNOSIS — Z7982 Long term (current) use of aspirin: Secondary | ICD-10-CM | POA: Diagnosis not present

## 2022-04-15 DIAGNOSIS — D631 Anemia in chronic kidney disease: Secondary | ICD-10-CM | POA: Diagnosis not present

## 2022-04-15 DIAGNOSIS — N186 End stage renal disease: Secondary | ICD-10-CM | POA: Diagnosis not present

## 2022-04-15 DIAGNOSIS — R0602 Shortness of breath: Secondary | ICD-10-CM | POA: Diagnosis not present

## 2022-04-15 DIAGNOSIS — Z952 Presence of prosthetic heart valve: Secondary | ICD-10-CM | POA: Diagnosis not present

## 2022-04-15 DIAGNOSIS — R110 Nausea: Secondary | ICD-10-CM | POA: Diagnosis not present

## 2022-04-15 DIAGNOSIS — I953 Hypotension of hemodialysis: Secondary | ICD-10-CM | POA: Diagnosis not present

## 2022-04-15 DIAGNOSIS — R42 Dizziness and giddiness: Secondary | ICD-10-CM | POA: Diagnosis not present

## 2022-04-15 DIAGNOSIS — J4489 Other specified chronic obstructive pulmonary disease: Secondary | ICD-10-CM | POA: Diagnosis not present

## 2022-04-15 DIAGNOSIS — E875 Hyperkalemia: Secondary | ICD-10-CM | POA: Diagnosis not present

## 2022-04-15 DIAGNOSIS — I120 Hypertensive chronic kidney disease with stage 5 chronic kidney disease or end stage renal disease: Secondary | ICD-10-CM | POA: Diagnosis not present

## 2022-04-15 DIAGNOSIS — E785 Hyperlipidemia, unspecified: Secondary | ICD-10-CM | POA: Diagnosis not present

## 2022-04-15 DIAGNOSIS — R188 Other ascites: Secondary | ICD-10-CM | POA: Diagnosis not present

## 2022-04-15 DIAGNOSIS — I5022 Chronic systolic (congestive) heart failure: Secondary | ICD-10-CM | POA: Diagnosis not present

## 2022-04-15 DIAGNOSIS — Z992 Dependence on renal dialysis: Secondary | ICD-10-CM | POA: Diagnosis not present

## 2022-04-17 DIAGNOSIS — D509 Iron deficiency anemia, unspecified: Secondary | ICD-10-CM | POA: Diagnosis not present

## 2022-04-17 DIAGNOSIS — N2581 Secondary hyperparathyroidism of renal origin: Secondary | ICD-10-CM | POA: Diagnosis not present

## 2022-04-17 DIAGNOSIS — N186 End stage renal disease: Secondary | ICD-10-CM | POA: Diagnosis not present

## 2022-04-17 DIAGNOSIS — E875 Hyperkalemia: Secondary | ICD-10-CM | POA: Diagnosis not present

## 2022-04-17 DIAGNOSIS — D689 Coagulation defect, unspecified: Secondary | ICD-10-CM | POA: Diagnosis not present

## 2022-04-17 DIAGNOSIS — Z992 Dependence on renal dialysis: Secondary | ICD-10-CM | POA: Diagnosis not present

## 2022-04-17 DIAGNOSIS — D631 Anemia in chronic kidney disease: Secondary | ICD-10-CM | POA: Diagnosis not present

## 2022-04-17 DIAGNOSIS — L299 Pruritus, unspecified: Secondary | ICD-10-CM | POA: Diagnosis not present

## 2022-04-19 DIAGNOSIS — Z992 Dependence on renal dialysis: Secondary | ICD-10-CM | POA: Diagnosis not present

## 2022-04-19 DIAGNOSIS — N2581 Secondary hyperparathyroidism of renal origin: Secondary | ICD-10-CM | POA: Diagnosis not present

## 2022-04-19 DIAGNOSIS — E875 Hyperkalemia: Secondary | ICD-10-CM | POA: Diagnosis not present

## 2022-04-19 DIAGNOSIS — N186 End stage renal disease: Secondary | ICD-10-CM | POA: Diagnosis not present

## 2022-04-19 DIAGNOSIS — L299 Pruritus, unspecified: Secondary | ICD-10-CM | POA: Diagnosis not present

## 2022-04-19 DIAGNOSIS — D509 Iron deficiency anemia, unspecified: Secondary | ICD-10-CM | POA: Diagnosis not present

## 2022-04-19 DIAGNOSIS — D689 Coagulation defect, unspecified: Secondary | ICD-10-CM | POA: Diagnosis not present

## 2022-04-19 DIAGNOSIS — D631 Anemia in chronic kidney disease: Secondary | ICD-10-CM | POA: Diagnosis not present

## 2022-04-20 DIAGNOSIS — K640 First degree hemorrhoids: Secondary | ICD-10-CM | POA: Diagnosis not present

## 2022-04-20 DIAGNOSIS — I110 Hypertensive heart disease with heart failure: Secondary | ICD-10-CM | POA: Diagnosis not present

## 2022-04-20 DIAGNOSIS — E785 Hyperlipidemia, unspecified: Secondary | ICD-10-CM | POA: Diagnosis not present

## 2022-04-20 DIAGNOSIS — M109 Gout, unspecified: Secondary | ICD-10-CM | POA: Diagnosis not present

## 2022-04-20 DIAGNOSIS — I5020 Unspecified systolic (congestive) heart failure: Secondary | ICD-10-CM | POA: Diagnosis not present

## 2022-04-21 DIAGNOSIS — D509 Iron deficiency anemia, unspecified: Secondary | ICD-10-CM | POA: Diagnosis not present

## 2022-04-21 DIAGNOSIS — N186 End stage renal disease: Secondary | ICD-10-CM | POA: Diagnosis not present

## 2022-04-21 DIAGNOSIS — L299 Pruritus, unspecified: Secondary | ICD-10-CM | POA: Diagnosis not present

## 2022-04-21 DIAGNOSIS — Z992 Dependence on renal dialysis: Secondary | ICD-10-CM | POA: Diagnosis not present

## 2022-04-21 DIAGNOSIS — D631 Anemia in chronic kidney disease: Secondary | ICD-10-CM | POA: Diagnosis not present

## 2022-04-21 DIAGNOSIS — E875 Hyperkalemia: Secondary | ICD-10-CM | POA: Diagnosis not present

## 2022-04-21 DIAGNOSIS — D689 Coagulation defect, unspecified: Secondary | ICD-10-CM | POA: Diagnosis not present

## 2022-04-21 DIAGNOSIS — N2581 Secondary hyperparathyroidism of renal origin: Secondary | ICD-10-CM | POA: Diagnosis not present

## 2022-04-24 DIAGNOSIS — N2581 Secondary hyperparathyroidism of renal origin: Secondary | ICD-10-CM | POA: Diagnosis not present

## 2022-04-24 DIAGNOSIS — L299 Pruritus, unspecified: Secondary | ICD-10-CM | POA: Diagnosis not present

## 2022-04-24 DIAGNOSIS — N186 End stage renal disease: Secondary | ICD-10-CM | POA: Diagnosis not present

## 2022-04-24 DIAGNOSIS — Z992 Dependence on renal dialysis: Secondary | ICD-10-CM | POA: Diagnosis not present

## 2022-04-24 DIAGNOSIS — D689 Coagulation defect, unspecified: Secondary | ICD-10-CM | POA: Diagnosis not present

## 2022-04-24 DIAGNOSIS — E875 Hyperkalemia: Secondary | ICD-10-CM | POA: Diagnosis not present

## 2022-04-24 DIAGNOSIS — D509 Iron deficiency anemia, unspecified: Secondary | ICD-10-CM | POA: Diagnosis not present

## 2022-04-24 DIAGNOSIS — D631 Anemia in chronic kidney disease: Secondary | ICD-10-CM | POA: Diagnosis not present

## 2022-04-26 DIAGNOSIS — E875 Hyperkalemia: Secondary | ICD-10-CM | POA: Diagnosis not present

## 2022-04-26 DIAGNOSIS — Z992 Dependence on renal dialysis: Secondary | ICD-10-CM | POA: Diagnosis not present

## 2022-04-26 DIAGNOSIS — D509 Iron deficiency anemia, unspecified: Secondary | ICD-10-CM | POA: Diagnosis not present

## 2022-04-26 DIAGNOSIS — N2581 Secondary hyperparathyroidism of renal origin: Secondary | ICD-10-CM | POA: Diagnosis not present

## 2022-04-26 DIAGNOSIS — D689 Coagulation defect, unspecified: Secondary | ICD-10-CM | POA: Diagnosis not present

## 2022-04-26 DIAGNOSIS — N186 End stage renal disease: Secondary | ICD-10-CM | POA: Diagnosis not present

## 2022-04-26 DIAGNOSIS — D631 Anemia in chronic kidney disease: Secondary | ICD-10-CM | POA: Diagnosis not present

## 2022-04-26 DIAGNOSIS — L299 Pruritus, unspecified: Secondary | ICD-10-CM | POA: Diagnosis not present

## 2022-04-28 ENCOUNTER — Telehealth: Payer: Self-pay

## 2022-04-28 DIAGNOSIS — D689 Coagulation defect, unspecified: Secondary | ICD-10-CM | POA: Diagnosis not present

## 2022-04-28 DIAGNOSIS — Z992 Dependence on renal dialysis: Secondary | ICD-10-CM | POA: Diagnosis not present

## 2022-04-28 DIAGNOSIS — E875 Hyperkalemia: Secondary | ICD-10-CM | POA: Diagnosis not present

## 2022-04-28 DIAGNOSIS — D509 Iron deficiency anemia, unspecified: Secondary | ICD-10-CM | POA: Diagnosis not present

## 2022-04-28 DIAGNOSIS — N186 End stage renal disease: Secondary | ICD-10-CM | POA: Diagnosis not present

## 2022-04-28 DIAGNOSIS — L299 Pruritus, unspecified: Secondary | ICD-10-CM | POA: Diagnosis not present

## 2022-04-28 DIAGNOSIS — D631 Anemia in chronic kidney disease: Secondary | ICD-10-CM | POA: Diagnosis not present

## 2022-04-28 DIAGNOSIS — N2581 Secondary hyperparathyroidism of renal origin: Secondary | ICD-10-CM | POA: Diagnosis not present

## 2022-04-28 NOTE — Patient Outreach (Signed)
  Care Coordination   Initial Visit Note   04/28/2022 Name: Vernon Blackburn MRN: [id number] DOB: Feb 21, 1965  Vernon Blackburn is a 57 y.o. year old male who sees Vernon Fusi, MD for primary care. I spoke with  Vernon Blackburn by phone today.  What matters to the patients health and wellness today?  Placed call to patient to review and offer [facility] care coordination program. Patient reports that he is doing well. He is currently on dialysis and is waiting for a kidney transplant.  Patient denies any needs at this time.       SDOH assessments and interventions completed:  No     Care Coordination Interventions:  No, not indicated   Follow up plan: No further intervention required.   Encounter Outcome:  Pt. Refused   Vernon Pavy, RN, BSN, CEN [facility] [employer] [phone]

## 2022-05-01 DIAGNOSIS — N2581 Secondary hyperparathyroidism of renal origin: Secondary | ICD-10-CM | POA: Diagnosis not present

## 2022-05-01 DIAGNOSIS — E875 Hyperkalemia: Secondary | ICD-10-CM | POA: Diagnosis not present

## 2022-05-01 DIAGNOSIS — N186 End stage renal disease: Secondary | ICD-10-CM | POA: Diagnosis not present

## 2022-05-01 DIAGNOSIS — D509 Iron deficiency anemia, unspecified: Secondary | ICD-10-CM | POA: Diagnosis not present

## 2022-05-01 DIAGNOSIS — L299 Pruritus, unspecified: Secondary | ICD-10-CM | POA: Diagnosis not present

## 2022-05-01 DIAGNOSIS — Z992 Dependence on renal dialysis: Secondary | ICD-10-CM | POA: Diagnosis not present

## 2022-05-01 DIAGNOSIS — D689 Coagulation defect, unspecified: Secondary | ICD-10-CM | POA: Diagnosis not present

## 2022-05-01 DIAGNOSIS — D631 Anemia in chronic kidney disease: Secondary | ICD-10-CM | POA: Diagnosis not present

## 2022-05-02 DIAGNOSIS — D638 Anemia in other chronic diseases classified elsewhere: Secondary | ICD-10-CM | POA: Diagnosis not present

## 2022-05-02 DIAGNOSIS — Z992 Dependence on renal dialysis: Secondary | ICD-10-CM | POA: Diagnosis not present

## 2022-05-02 DIAGNOSIS — I129 Hypertensive chronic kidney disease with stage 1 through stage 4 chronic kidney disease, or unspecified chronic kidney disease: Secondary | ICD-10-CM | POA: Diagnosis not present

## 2022-05-02 DIAGNOSIS — I5022 Chronic systolic (congestive) heart failure: Secondary | ICD-10-CM | POA: Diagnosis not present

## 2022-05-02 DIAGNOSIS — N186 End stage renal disease: Secondary | ICD-10-CM | POA: Diagnosis not present

## 2022-05-02 DIAGNOSIS — I130 Hypertensive heart and chronic kidney disease with heart failure and stage 1 through stage 4 chronic kidney disease, or unspecified chronic kidney disease: Secondary | ICD-10-CM | POA: Diagnosis not present

## 2022-05-03 DIAGNOSIS — Z992 Dependence on renal dialysis: Secondary | ICD-10-CM | POA: Diagnosis not present

## 2022-05-03 DIAGNOSIS — E875 Hyperkalemia: Secondary | ICD-10-CM | POA: Diagnosis not present

## 2022-05-03 DIAGNOSIS — N2581 Secondary hyperparathyroidism of renal origin: Secondary | ICD-10-CM | POA: Diagnosis not present

## 2022-05-03 DIAGNOSIS — D689 Coagulation defect, unspecified: Secondary | ICD-10-CM | POA: Diagnosis not present

## 2022-05-03 DIAGNOSIS — L299 Pruritus, unspecified: Secondary | ICD-10-CM | POA: Diagnosis not present

## 2022-05-03 DIAGNOSIS — N186 End stage renal disease: Secondary | ICD-10-CM | POA: Diagnosis not present

## 2022-05-03 DIAGNOSIS — D509 Iron deficiency anemia, unspecified: Secondary | ICD-10-CM | POA: Diagnosis not present

## 2022-05-05 DIAGNOSIS — N2581 Secondary hyperparathyroidism of renal origin: Secondary | ICD-10-CM | POA: Diagnosis not present

## 2022-05-05 DIAGNOSIS — D689 Coagulation defect, unspecified: Secondary | ICD-10-CM | POA: Diagnosis not present

## 2022-05-05 DIAGNOSIS — L299 Pruritus, unspecified: Secondary | ICD-10-CM | POA: Diagnosis not present

## 2022-05-05 DIAGNOSIS — Z992 Dependence on renal dialysis: Secondary | ICD-10-CM | POA: Diagnosis not present

## 2022-05-05 DIAGNOSIS — E875 Hyperkalemia: Secondary | ICD-10-CM | POA: Diagnosis not present

## 2022-05-05 DIAGNOSIS — N186 End stage renal disease: Secondary | ICD-10-CM | POA: Diagnosis not present

## 2022-05-05 DIAGNOSIS — D509 Iron deficiency anemia, unspecified: Secondary | ICD-10-CM | POA: Diagnosis not present

## 2022-05-08 DIAGNOSIS — D509 Iron deficiency anemia, unspecified: Secondary | ICD-10-CM | POA: Diagnosis not present

## 2022-05-08 DIAGNOSIS — E875 Hyperkalemia: Secondary | ICD-10-CM | POA: Diagnosis not present

## 2022-05-08 DIAGNOSIS — N186 End stage renal disease: Secondary | ICD-10-CM | POA: Diagnosis not present

## 2022-05-08 DIAGNOSIS — Z7682 Awaiting organ transplant status: Secondary | ICD-10-CM | POA: Diagnosis not present

## 2022-05-08 DIAGNOSIS — Z992 Dependence on renal dialysis: Secondary | ICD-10-CM | POA: Diagnosis not present

## 2022-05-08 DIAGNOSIS — N2581 Secondary hyperparathyroidism of renal origin: Secondary | ICD-10-CM | POA: Diagnosis not present

## 2022-05-08 DIAGNOSIS — D689 Coagulation defect, unspecified: Secondary | ICD-10-CM | POA: Diagnosis not present

## 2022-05-08 DIAGNOSIS — L299 Pruritus, unspecified: Secondary | ICD-10-CM | POA: Diagnosis not present

## 2022-05-10 DIAGNOSIS — D689 Coagulation defect, unspecified: Secondary | ICD-10-CM | POA: Diagnosis not present

## 2022-05-10 DIAGNOSIS — L299 Pruritus, unspecified: Secondary | ICD-10-CM | POA: Diagnosis not present

## 2022-05-10 DIAGNOSIS — Z992 Dependence on renal dialysis: Secondary | ICD-10-CM | POA: Diagnosis not present

## 2022-05-10 DIAGNOSIS — N186 End stage renal disease: Secondary | ICD-10-CM | POA: Diagnosis not present

## 2022-05-10 DIAGNOSIS — D509 Iron deficiency anemia, unspecified: Secondary | ICD-10-CM | POA: Diagnosis not present

## 2022-05-10 DIAGNOSIS — E875 Hyperkalemia: Secondary | ICD-10-CM | POA: Diagnosis not present

## 2022-05-10 DIAGNOSIS — N2581 Secondary hyperparathyroidism of renal origin: Secondary | ICD-10-CM | POA: Diagnosis not present

## 2022-05-12 DIAGNOSIS — D509 Iron deficiency anemia, unspecified: Secondary | ICD-10-CM | POA: Diagnosis not present

## 2022-05-12 DIAGNOSIS — N2581 Secondary hyperparathyroidism of renal origin: Secondary | ICD-10-CM | POA: Diagnosis not present

## 2022-05-12 DIAGNOSIS — Z992 Dependence on renal dialysis: Secondary | ICD-10-CM | POA: Diagnosis not present

## 2022-05-12 DIAGNOSIS — N186 End stage renal disease: Secondary | ICD-10-CM | POA: Diagnosis not present

## 2022-05-12 DIAGNOSIS — D689 Coagulation defect, unspecified: Secondary | ICD-10-CM | POA: Diagnosis not present

## 2022-05-12 DIAGNOSIS — E875 Hyperkalemia: Secondary | ICD-10-CM | POA: Diagnosis not present

## 2022-05-12 DIAGNOSIS — L299 Pruritus, unspecified: Secondary | ICD-10-CM | POA: Diagnosis not present

## 2022-05-15 DIAGNOSIS — D509 Iron deficiency anemia, unspecified: Secondary | ICD-10-CM | POA: Diagnosis not present

## 2022-05-15 DIAGNOSIS — N2581 Secondary hyperparathyroidism of renal origin: Secondary | ICD-10-CM | POA: Diagnosis not present

## 2022-05-15 DIAGNOSIS — D689 Coagulation defect, unspecified: Secondary | ICD-10-CM | POA: Diagnosis not present

## 2022-05-15 DIAGNOSIS — E875 Hyperkalemia: Secondary | ICD-10-CM | POA: Diagnosis not present

## 2022-05-15 DIAGNOSIS — L299 Pruritus, unspecified: Secondary | ICD-10-CM | POA: Diagnosis not present

## 2022-05-15 DIAGNOSIS — Z992 Dependence on renal dialysis: Secondary | ICD-10-CM | POA: Diagnosis not present

## 2022-05-15 DIAGNOSIS — N186 End stage renal disease: Secondary | ICD-10-CM | POA: Diagnosis not present

## 2022-05-17 DIAGNOSIS — N186 End stage renal disease: Secondary | ICD-10-CM | POA: Diagnosis not present

## 2022-05-17 DIAGNOSIS — Z992 Dependence on renal dialysis: Secondary | ICD-10-CM | POA: Diagnosis not present

## 2022-05-17 DIAGNOSIS — D689 Coagulation defect, unspecified: Secondary | ICD-10-CM | POA: Diagnosis not present

## 2022-05-17 DIAGNOSIS — E875 Hyperkalemia: Secondary | ICD-10-CM | POA: Diagnosis not present

## 2022-05-17 DIAGNOSIS — N2581 Secondary hyperparathyroidism of renal origin: Secondary | ICD-10-CM | POA: Diagnosis not present

## 2022-05-17 DIAGNOSIS — L299 Pruritus, unspecified: Secondary | ICD-10-CM | POA: Diagnosis not present

## 2022-05-17 DIAGNOSIS — D509 Iron deficiency anemia, unspecified: Secondary | ICD-10-CM | POA: Diagnosis not present

## 2022-05-19 DIAGNOSIS — N186 End stage renal disease: Secondary | ICD-10-CM | POA: Diagnosis not present

## 2022-05-19 DIAGNOSIS — D689 Coagulation defect, unspecified: Secondary | ICD-10-CM | POA: Diagnosis not present

## 2022-05-19 DIAGNOSIS — E875 Hyperkalemia: Secondary | ICD-10-CM | POA: Diagnosis not present

## 2022-05-19 DIAGNOSIS — N2581 Secondary hyperparathyroidism of renal origin: Secondary | ICD-10-CM | POA: Diagnosis not present

## 2022-05-19 DIAGNOSIS — L299 Pruritus, unspecified: Secondary | ICD-10-CM | POA: Diagnosis not present

## 2022-05-19 DIAGNOSIS — Z992 Dependence on renal dialysis: Secondary | ICD-10-CM | POA: Diagnosis not present

## 2022-05-19 DIAGNOSIS — D509 Iron deficiency anemia, unspecified: Secondary | ICD-10-CM | POA: Diagnosis not present

## 2022-05-22 DIAGNOSIS — N2581 Secondary hyperparathyroidism of renal origin: Secondary | ICD-10-CM | POA: Diagnosis not present

## 2022-05-22 DIAGNOSIS — N186 End stage renal disease: Secondary | ICD-10-CM | POA: Diagnosis not present

## 2022-05-22 DIAGNOSIS — E875 Hyperkalemia: Secondary | ICD-10-CM | POA: Diagnosis not present

## 2022-05-22 DIAGNOSIS — D689 Coagulation defect, unspecified: Secondary | ICD-10-CM | POA: Diagnosis not present

## 2022-05-22 DIAGNOSIS — Z992 Dependence on renal dialysis: Secondary | ICD-10-CM | POA: Diagnosis not present

## 2022-05-22 DIAGNOSIS — L299 Pruritus, unspecified: Secondary | ICD-10-CM | POA: Diagnosis not present

## 2022-05-22 DIAGNOSIS — D509 Iron deficiency anemia, unspecified: Secondary | ICD-10-CM | POA: Diagnosis not present

## 2022-05-24 DIAGNOSIS — E875 Hyperkalemia: Secondary | ICD-10-CM | POA: Diagnosis not present

## 2022-05-24 DIAGNOSIS — N186 End stage renal disease: Secondary | ICD-10-CM | POA: Diagnosis not present

## 2022-05-24 DIAGNOSIS — Z992 Dependence on renal dialysis: Secondary | ICD-10-CM | POA: Diagnosis not present

## 2022-05-24 DIAGNOSIS — D509 Iron deficiency anemia, unspecified: Secondary | ICD-10-CM | POA: Diagnosis not present

## 2022-05-24 DIAGNOSIS — N2581 Secondary hyperparathyroidism of renal origin: Secondary | ICD-10-CM | POA: Diagnosis not present

## 2022-05-24 DIAGNOSIS — D689 Coagulation defect, unspecified: Secondary | ICD-10-CM | POA: Diagnosis not present

## 2022-05-24 DIAGNOSIS — L299 Pruritus, unspecified: Secondary | ICD-10-CM | POA: Diagnosis not present

## 2022-05-25 DIAGNOSIS — Z9889 Other specified postprocedural states: Secondary | ICD-10-CM | POA: Diagnosis not present

## 2022-05-25 DIAGNOSIS — R932 Abnormal findings on diagnostic imaging of liver and biliary tract: Secondary | ICD-10-CM | POA: Diagnosis not present

## 2022-05-25 DIAGNOSIS — I120 Hypertensive chronic kidney disease with stage 5 chronic kidney disease or end stage renal disease: Secondary | ICD-10-CM | POA: Diagnosis not present

## 2022-05-25 DIAGNOSIS — Z01818 Encounter for other preprocedural examination: Secondary | ICD-10-CM | POA: Diagnosis not present

## 2022-05-25 DIAGNOSIS — I10 Essential (primary) hypertension: Secondary | ICD-10-CM | POA: Diagnosis not present

## 2022-05-25 DIAGNOSIS — Z992 Dependence on renal dialysis: Secondary | ICD-10-CM | POA: Diagnosis not present

## 2022-05-25 DIAGNOSIS — Z9884 Bariatric surgery status: Secondary | ICD-10-CM | POA: Diagnosis not present

## 2022-05-25 DIAGNOSIS — Z1159 Encounter for screening for other viral diseases: Secondary | ICD-10-CM | POA: Diagnosis not present

## 2022-05-25 DIAGNOSIS — N186 End stage renal disease: Secondary | ICD-10-CM | POA: Diagnosis not present

## 2022-05-25 DIAGNOSIS — Z87898 Personal history of other specified conditions: Secondary | ICD-10-CM | POA: Diagnosis not present

## 2022-05-26 DIAGNOSIS — E875 Hyperkalemia: Secondary | ICD-10-CM | POA: Diagnosis not present

## 2022-05-26 DIAGNOSIS — D689 Coagulation defect, unspecified: Secondary | ICD-10-CM | POA: Diagnosis not present

## 2022-05-26 DIAGNOSIS — L299 Pruritus, unspecified: Secondary | ICD-10-CM | POA: Diagnosis not present

## 2022-05-26 DIAGNOSIS — Z992 Dependence on renal dialysis: Secondary | ICD-10-CM | POA: Diagnosis not present

## 2022-05-26 DIAGNOSIS — N186 End stage renal disease: Secondary | ICD-10-CM | POA: Diagnosis not present

## 2022-05-26 DIAGNOSIS — D509 Iron deficiency anemia, unspecified: Secondary | ICD-10-CM | POA: Diagnosis not present

## 2022-05-26 DIAGNOSIS — N2581 Secondary hyperparathyroidism of renal origin: Secondary | ICD-10-CM | POA: Diagnosis not present

## 2022-05-29 DIAGNOSIS — L299 Pruritus, unspecified: Secondary | ICD-10-CM | POA: Diagnosis not present

## 2022-05-29 DIAGNOSIS — N2581 Secondary hyperparathyroidism of renal origin: Secondary | ICD-10-CM | POA: Diagnosis not present

## 2022-05-29 DIAGNOSIS — D509 Iron deficiency anemia, unspecified: Secondary | ICD-10-CM | POA: Diagnosis not present

## 2022-05-29 DIAGNOSIS — D689 Coagulation defect, unspecified: Secondary | ICD-10-CM | POA: Diagnosis not present

## 2022-05-29 DIAGNOSIS — N186 End stage renal disease: Secondary | ICD-10-CM | POA: Diagnosis not present

## 2022-05-29 DIAGNOSIS — Z992 Dependence on renal dialysis: Secondary | ICD-10-CM | POA: Diagnosis not present

## 2022-05-29 DIAGNOSIS — E875 Hyperkalemia: Secondary | ICD-10-CM | POA: Diagnosis not present

## 2022-05-31 DIAGNOSIS — D509 Iron deficiency anemia, unspecified: Secondary | ICD-10-CM | POA: Diagnosis not present

## 2022-05-31 DIAGNOSIS — E875 Hyperkalemia: Secondary | ICD-10-CM | POA: Diagnosis not present

## 2022-05-31 DIAGNOSIS — N186 End stage renal disease: Secondary | ICD-10-CM | POA: Diagnosis not present

## 2022-05-31 DIAGNOSIS — D689 Coagulation defect, unspecified: Secondary | ICD-10-CM | POA: Diagnosis not present

## 2022-05-31 DIAGNOSIS — L299 Pruritus, unspecified: Secondary | ICD-10-CM | POA: Diagnosis not present

## 2022-05-31 DIAGNOSIS — Z992 Dependence on renal dialysis: Secondary | ICD-10-CM | POA: Diagnosis not present

## 2022-05-31 DIAGNOSIS — N2581 Secondary hyperparathyroidism of renal origin: Secondary | ICD-10-CM | POA: Diagnosis not present

## 2022-06-01 ENCOUNTER — Other Ambulatory Visit (HOSPITAL_COMMUNITY): Payer: Self-pay

## 2022-06-01 MED ORDER — CARVEDILOL 3.125 MG PO TABS: 3.1250 mg | ORAL_TABLET | Freq: Two times a day (BID) | ORAL | 3 refills | Status: DC

## 2022-06-02 DIAGNOSIS — N186 End stage renal disease: Secondary | ICD-10-CM | POA: Diagnosis not present

## 2022-06-02 DIAGNOSIS — D689 Coagulation defect, unspecified: Secondary | ICD-10-CM | POA: Diagnosis not present

## 2022-06-02 DIAGNOSIS — E875 Hyperkalemia: Secondary | ICD-10-CM | POA: Diagnosis not present

## 2022-06-02 DIAGNOSIS — N2581 Secondary hyperparathyroidism of renal origin: Secondary | ICD-10-CM | POA: Diagnosis not present

## 2022-06-02 DIAGNOSIS — L299 Pruritus, unspecified: Secondary | ICD-10-CM | POA: Diagnosis not present

## 2022-06-02 DIAGNOSIS — I129 Hypertensive chronic kidney disease with stage 1 through stage 4 chronic kidney disease, or unspecified chronic kidney disease: Secondary | ICD-10-CM | POA: Diagnosis not present

## 2022-06-02 DIAGNOSIS — D509 Iron deficiency anemia, unspecified: Secondary | ICD-10-CM | POA: Diagnosis not present

## 2022-06-02 DIAGNOSIS — Z992 Dependence on renal dialysis: Secondary | ICD-10-CM | POA: Diagnosis not present

## 2022-06-05 DIAGNOSIS — D689 Coagulation defect, unspecified: Secondary | ICD-10-CM | POA: Diagnosis not present

## 2022-06-05 DIAGNOSIS — N186 End stage renal disease: Secondary | ICD-10-CM | POA: Diagnosis not present

## 2022-06-05 DIAGNOSIS — D509 Iron deficiency anemia, unspecified: Secondary | ICD-10-CM | POA: Diagnosis not present

## 2022-06-05 DIAGNOSIS — E875 Hyperkalemia: Secondary | ICD-10-CM | POA: Diagnosis not present

## 2022-06-05 DIAGNOSIS — Z992 Dependence on renal dialysis: Secondary | ICD-10-CM | POA: Diagnosis not present

## 2022-06-05 DIAGNOSIS — N2581 Secondary hyperparathyroidism of renal origin: Secondary | ICD-10-CM | POA: Diagnosis not present

## 2022-06-07 DIAGNOSIS — Z992 Dependence on renal dialysis: Secondary | ICD-10-CM | POA: Diagnosis not present

## 2022-06-07 DIAGNOSIS — D689 Coagulation defect, unspecified: Secondary | ICD-10-CM | POA: Diagnosis not present

## 2022-06-07 DIAGNOSIS — D509 Iron deficiency anemia, unspecified: Secondary | ICD-10-CM | POA: Diagnosis not present

## 2022-06-07 DIAGNOSIS — N2581 Secondary hyperparathyroidism of renal origin: Secondary | ICD-10-CM | POA: Diagnosis not present

## 2022-06-07 DIAGNOSIS — N186 End stage renal disease: Secondary | ICD-10-CM | POA: Diagnosis not present

## 2022-06-07 DIAGNOSIS — E875 Hyperkalemia: Secondary | ICD-10-CM | POA: Diagnosis not present

## 2022-06-09 DIAGNOSIS — E875 Hyperkalemia: Secondary | ICD-10-CM | POA: Diagnosis not present

## 2022-06-09 DIAGNOSIS — D689 Coagulation defect, unspecified: Secondary | ICD-10-CM | POA: Diagnosis not present

## 2022-06-09 DIAGNOSIS — D509 Iron deficiency anemia, unspecified: Secondary | ICD-10-CM | POA: Diagnosis not present

## 2022-06-09 DIAGNOSIS — N2581 Secondary hyperparathyroidism of renal origin: Secondary | ICD-10-CM | POA: Diagnosis not present

## 2022-06-09 DIAGNOSIS — N186 End stage renal disease: Secondary | ICD-10-CM | POA: Diagnosis not present

## 2022-06-09 DIAGNOSIS — Z992 Dependence on renal dialysis: Secondary | ICD-10-CM | POA: Diagnosis not present

## 2022-06-12 DIAGNOSIS — D509 Iron deficiency anemia, unspecified: Secondary | ICD-10-CM | POA: Diagnosis not present

## 2022-06-12 DIAGNOSIS — N2581 Secondary hyperparathyroidism of renal origin: Secondary | ICD-10-CM | POA: Diagnosis not present

## 2022-06-12 DIAGNOSIS — Z992 Dependence on renal dialysis: Secondary | ICD-10-CM | POA: Diagnosis not present

## 2022-06-12 DIAGNOSIS — D689 Coagulation defect, unspecified: Secondary | ICD-10-CM | POA: Diagnosis not present

## 2022-06-12 DIAGNOSIS — E875 Hyperkalemia: Secondary | ICD-10-CM | POA: Diagnosis not present

## 2022-06-12 DIAGNOSIS — N186 End stage renal disease: Secondary | ICD-10-CM | POA: Diagnosis not present

## 2022-06-14 DIAGNOSIS — N186 End stage renal disease: Secondary | ICD-10-CM | POA: Diagnosis not present

## 2022-06-14 DIAGNOSIS — E875 Hyperkalemia: Secondary | ICD-10-CM | POA: Diagnosis not present

## 2022-06-14 DIAGNOSIS — N2581 Secondary hyperparathyroidism of renal origin: Secondary | ICD-10-CM | POA: Diagnosis not present

## 2022-06-14 DIAGNOSIS — D689 Coagulation defect, unspecified: Secondary | ICD-10-CM | POA: Diagnosis not present

## 2022-06-14 DIAGNOSIS — D509 Iron deficiency anemia, unspecified: Secondary | ICD-10-CM | POA: Diagnosis not present

## 2022-06-14 DIAGNOSIS — Z992 Dependence on renal dialysis: Secondary | ICD-10-CM | POA: Diagnosis not present

## 2022-06-16 DIAGNOSIS — Z992 Dependence on renal dialysis: Secondary | ICD-10-CM | POA: Diagnosis not present

## 2022-06-16 DIAGNOSIS — D689 Coagulation defect, unspecified: Secondary | ICD-10-CM | POA: Diagnosis not present

## 2022-06-16 DIAGNOSIS — E875 Hyperkalemia: Secondary | ICD-10-CM | POA: Diagnosis not present

## 2022-06-16 DIAGNOSIS — N2581 Secondary hyperparathyroidism of renal origin: Secondary | ICD-10-CM | POA: Diagnosis not present

## 2022-06-16 DIAGNOSIS — N186 End stage renal disease: Secondary | ICD-10-CM | POA: Diagnosis not present

## 2022-06-16 DIAGNOSIS — D509 Iron deficiency anemia, unspecified: Secondary | ICD-10-CM | POA: Diagnosis not present

## 2022-06-19 DIAGNOSIS — N186 End stage renal disease: Secondary | ICD-10-CM | POA: Diagnosis not present

## 2022-06-19 DIAGNOSIS — Z992 Dependence on renal dialysis: Secondary | ICD-10-CM | POA: Diagnosis not present

## 2022-06-19 DIAGNOSIS — E875 Hyperkalemia: Secondary | ICD-10-CM | POA: Diagnosis not present

## 2022-06-19 DIAGNOSIS — N2581 Secondary hyperparathyroidism of renal origin: Secondary | ICD-10-CM | POA: Diagnosis not present

## 2022-06-19 DIAGNOSIS — D509 Iron deficiency anemia, unspecified: Secondary | ICD-10-CM | POA: Diagnosis not present

## 2022-06-19 DIAGNOSIS — D689 Coagulation defect, unspecified: Secondary | ICD-10-CM | POA: Diagnosis not present

## 2022-06-21 DIAGNOSIS — E875 Hyperkalemia: Secondary | ICD-10-CM | POA: Diagnosis not present

## 2022-06-21 DIAGNOSIS — N2581 Secondary hyperparathyroidism of renal origin: Secondary | ICD-10-CM | POA: Diagnosis not present

## 2022-06-21 DIAGNOSIS — D689 Coagulation defect, unspecified: Secondary | ICD-10-CM | POA: Diagnosis not present

## 2022-06-21 DIAGNOSIS — Z992 Dependence on renal dialysis: Secondary | ICD-10-CM | POA: Diagnosis not present

## 2022-06-21 DIAGNOSIS — N186 End stage renal disease: Secondary | ICD-10-CM | POA: Diagnosis not present

## 2022-06-21 DIAGNOSIS — D509 Iron deficiency anemia, unspecified: Secondary | ICD-10-CM | POA: Diagnosis not present

## 2022-06-23 DIAGNOSIS — D509 Iron deficiency anemia, unspecified: Secondary | ICD-10-CM | POA: Diagnosis not present

## 2022-06-23 DIAGNOSIS — D689 Coagulation defect, unspecified: Secondary | ICD-10-CM | POA: Diagnosis not present

## 2022-06-23 DIAGNOSIS — E875 Hyperkalemia: Secondary | ICD-10-CM | POA: Diagnosis not present

## 2022-06-23 DIAGNOSIS — N186 End stage renal disease: Secondary | ICD-10-CM | POA: Diagnosis not present

## 2022-06-23 DIAGNOSIS — N2581 Secondary hyperparathyroidism of renal origin: Secondary | ICD-10-CM | POA: Diagnosis not present

## 2022-06-23 DIAGNOSIS — Z992 Dependence on renal dialysis: Secondary | ICD-10-CM | POA: Diagnosis not present

## 2022-06-26 DIAGNOSIS — Z992 Dependence on renal dialysis: Secondary | ICD-10-CM | POA: Diagnosis not present

## 2022-06-26 DIAGNOSIS — N186 End stage renal disease: Secondary | ICD-10-CM | POA: Diagnosis not present

## 2022-06-26 DIAGNOSIS — E875 Hyperkalemia: Secondary | ICD-10-CM | POA: Diagnosis not present

## 2022-06-26 DIAGNOSIS — N2581 Secondary hyperparathyroidism of renal origin: Secondary | ICD-10-CM | POA: Diagnosis not present

## 2022-06-26 DIAGNOSIS — D509 Iron deficiency anemia, unspecified: Secondary | ICD-10-CM | POA: Diagnosis not present

## 2022-06-26 DIAGNOSIS — D689 Coagulation defect, unspecified: Secondary | ICD-10-CM | POA: Diagnosis not present

## 2022-06-28 DIAGNOSIS — E875 Hyperkalemia: Secondary | ICD-10-CM | POA: Diagnosis not present

## 2022-06-28 DIAGNOSIS — D509 Iron deficiency anemia, unspecified: Secondary | ICD-10-CM | POA: Diagnosis not present

## 2022-06-28 DIAGNOSIS — D689 Coagulation defect, unspecified: Secondary | ICD-10-CM | POA: Diagnosis not present

## 2022-06-28 DIAGNOSIS — N2581 Secondary hyperparathyroidism of renal origin: Secondary | ICD-10-CM | POA: Diagnosis not present

## 2022-06-28 DIAGNOSIS — N186 End stage renal disease: Secondary | ICD-10-CM | POA: Diagnosis not present

## 2022-06-28 DIAGNOSIS — Z992 Dependence on renal dialysis: Secondary | ICD-10-CM | POA: Diagnosis not present

## 2022-06-30 DIAGNOSIS — D509 Iron deficiency anemia, unspecified: Secondary | ICD-10-CM | POA: Diagnosis not present

## 2022-06-30 DIAGNOSIS — N2581 Secondary hyperparathyroidism of renal origin: Secondary | ICD-10-CM | POA: Diagnosis not present

## 2022-06-30 DIAGNOSIS — D689 Coagulation defect, unspecified: Secondary | ICD-10-CM | POA: Diagnosis not present

## 2022-06-30 DIAGNOSIS — Z992 Dependence on renal dialysis: Secondary | ICD-10-CM | POA: Diagnosis not present

## 2022-06-30 DIAGNOSIS — E875 Hyperkalemia: Secondary | ICD-10-CM | POA: Diagnosis not present

## 2022-06-30 DIAGNOSIS — N186 End stage renal disease: Secondary | ICD-10-CM | POA: Diagnosis not present

## 2022-07-02 DIAGNOSIS — I129 Hypertensive chronic kidney disease with stage 1 through stage 4 chronic kidney disease, or unspecified chronic kidney disease: Secondary | ICD-10-CM | POA: Diagnosis not present

## 2022-07-02 DIAGNOSIS — N186 End stage renal disease: Secondary | ICD-10-CM | POA: Diagnosis not present

## 2022-07-02 DIAGNOSIS — Z992 Dependence on renal dialysis: Secondary | ICD-10-CM | POA: Diagnosis not present

## 2022-07-04 DIAGNOSIS — Z992 Dependence on renal dialysis: Secondary | ICD-10-CM | POA: Diagnosis not present

## 2022-07-04 DIAGNOSIS — N2581 Secondary hyperparathyroidism of renal origin: Secondary | ICD-10-CM | POA: Diagnosis not present

## 2022-07-04 DIAGNOSIS — D509 Iron deficiency anemia, unspecified: Secondary | ICD-10-CM | POA: Diagnosis not present

## 2022-07-04 DIAGNOSIS — D689 Coagulation defect, unspecified: Secondary | ICD-10-CM | POA: Diagnosis not present

## 2022-07-04 DIAGNOSIS — N186 End stage renal disease: Secondary | ICD-10-CM | POA: Diagnosis not present

## 2022-07-04 DIAGNOSIS — E875 Hyperkalemia: Secondary | ICD-10-CM | POA: Diagnosis not present

## 2022-07-05 DIAGNOSIS — E875 Hyperkalemia: Secondary | ICD-10-CM | POA: Diagnosis not present

## 2022-07-05 DIAGNOSIS — Z992 Dependence on renal dialysis: Secondary | ICD-10-CM | POA: Diagnosis not present

## 2022-07-05 DIAGNOSIS — D689 Coagulation defect, unspecified: Secondary | ICD-10-CM | POA: Diagnosis not present

## 2022-07-05 DIAGNOSIS — N2581 Secondary hyperparathyroidism of renal origin: Secondary | ICD-10-CM | POA: Diagnosis not present

## 2022-07-05 DIAGNOSIS — D509 Iron deficiency anemia, unspecified: Secondary | ICD-10-CM | POA: Diagnosis not present

## 2022-07-05 DIAGNOSIS — N186 End stage renal disease: Secondary | ICD-10-CM | POA: Diagnosis not present

## 2022-07-06 DIAGNOSIS — N186 End stage renal disease: Secondary | ICD-10-CM | POA: Diagnosis not present

## 2022-07-06 DIAGNOSIS — D509 Iron deficiency anemia, unspecified: Secondary | ICD-10-CM | POA: Diagnosis not present

## 2022-07-06 DIAGNOSIS — E875 Hyperkalemia: Secondary | ICD-10-CM | POA: Diagnosis not present

## 2022-07-06 DIAGNOSIS — Z992 Dependence on renal dialysis: Secondary | ICD-10-CM | POA: Diagnosis not present

## 2022-07-06 DIAGNOSIS — D689 Coagulation defect, unspecified: Secondary | ICD-10-CM | POA: Diagnosis not present

## 2022-07-06 DIAGNOSIS — N2581 Secondary hyperparathyroidism of renal origin: Secondary | ICD-10-CM | POA: Diagnosis not present

## 2022-07-10 DIAGNOSIS — D689 Coagulation defect, unspecified: Secondary | ICD-10-CM | POA: Diagnosis not present

## 2022-07-10 DIAGNOSIS — N2581 Secondary hyperparathyroidism of renal origin: Secondary | ICD-10-CM | POA: Diagnosis not present

## 2022-07-10 DIAGNOSIS — E875 Hyperkalemia: Secondary | ICD-10-CM | POA: Diagnosis not present

## 2022-07-10 DIAGNOSIS — N186 End stage renal disease: Secondary | ICD-10-CM | POA: Diagnosis not present

## 2022-07-10 DIAGNOSIS — Z992 Dependence on renal dialysis: Secondary | ICD-10-CM | POA: Diagnosis not present

## 2022-07-10 DIAGNOSIS — D509 Iron deficiency anemia, unspecified: Secondary | ICD-10-CM | POA: Diagnosis not present

## 2022-07-11 DIAGNOSIS — R932 Abnormal findings on diagnostic imaging of liver and biliary tract: Secondary | ICD-10-CM | POA: Diagnosis not present

## 2022-07-11 DIAGNOSIS — Z87898 Personal history of other specified conditions: Secondary | ICD-10-CM | POA: Diagnosis not present

## 2022-07-11 DIAGNOSIS — Z01818 Encounter for other preprocedural examination: Secondary | ICD-10-CM | POA: Diagnosis not present

## 2022-07-11 DIAGNOSIS — K760 Fatty (change of) liver, not elsewhere classified: Secondary | ICD-10-CM | POA: Diagnosis not present

## 2022-07-12 DIAGNOSIS — D509 Iron deficiency anemia, unspecified: Secondary | ICD-10-CM | POA: Diagnosis not present

## 2022-07-12 DIAGNOSIS — Z992 Dependence on renal dialysis: Secondary | ICD-10-CM | POA: Diagnosis not present

## 2022-07-12 DIAGNOSIS — E875 Hyperkalemia: Secondary | ICD-10-CM | POA: Diagnosis not present

## 2022-07-12 DIAGNOSIS — N2581 Secondary hyperparathyroidism of renal origin: Secondary | ICD-10-CM | POA: Diagnosis not present

## 2022-07-12 DIAGNOSIS — D689 Coagulation defect, unspecified: Secondary | ICD-10-CM | POA: Diagnosis not present

## 2022-07-12 DIAGNOSIS — N186 End stage renal disease: Secondary | ICD-10-CM | POA: Diagnosis not present

## 2022-07-15 DIAGNOSIS — D509 Iron deficiency anemia, unspecified: Secondary | ICD-10-CM | POA: Diagnosis not present

## 2022-07-15 DIAGNOSIS — N186 End stage renal disease: Secondary | ICD-10-CM | POA: Diagnosis not present

## 2022-07-15 DIAGNOSIS — D689 Coagulation defect, unspecified: Secondary | ICD-10-CM | POA: Diagnosis not present

## 2022-07-15 DIAGNOSIS — Z992 Dependence on renal dialysis: Secondary | ICD-10-CM | POA: Diagnosis not present

## 2022-07-15 DIAGNOSIS — E875 Hyperkalemia: Secondary | ICD-10-CM | POA: Diagnosis not present

## 2022-07-15 DIAGNOSIS — N2581 Secondary hyperparathyroidism of renal origin: Secondary | ICD-10-CM | POA: Diagnosis not present

## 2022-07-17 DIAGNOSIS — N2581 Secondary hyperparathyroidism of renal origin: Secondary | ICD-10-CM | POA: Diagnosis not present

## 2022-07-17 DIAGNOSIS — D689 Coagulation defect, unspecified: Secondary | ICD-10-CM | POA: Diagnosis not present

## 2022-07-17 DIAGNOSIS — Z992 Dependence on renal dialysis: Secondary | ICD-10-CM | POA: Diagnosis not present

## 2022-07-17 DIAGNOSIS — E875 Hyperkalemia: Secondary | ICD-10-CM | POA: Diagnosis not present

## 2022-07-17 DIAGNOSIS — D509 Iron deficiency anemia, unspecified: Secondary | ICD-10-CM | POA: Diagnosis not present

## 2022-07-17 DIAGNOSIS — N186 End stage renal disease: Secondary | ICD-10-CM | POA: Diagnosis not present

## 2022-07-19 DIAGNOSIS — D509 Iron deficiency anemia, unspecified: Secondary | ICD-10-CM | POA: Diagnosis not present

## 2022-07-19 DIAGNOSIS — N186 End stage renal disease: Secondary | ICD-10-CM | POA: Diagnosis not present

## 2022-07-19 DIAGNOSIS — Z992 Dependence on renal dialysis: Secondary | ICD-10-CM | POA: Diagnosis not present

## 2022-07-19 DIAGNOSIS — N2581 Secondary hyperparathyroidism of renal origin: Secondary | ICD-10-CM | POA: Diagnosis not present

## 2022-07-19 DIAGNOSIS — D689 Coagulation defect, unspecified: Secondary | ICD-10-CM | POA: Diagnosis not present

## 2022-07-19 DIAGNOSIS — E875 Hyperkalemia: Secondary | ICD-10-CM | POA: Diagnosis not present

## 2022-07-21 DIAGNOSIS — N186 End stage renal disease: Secondary | ICD-10-CM | POA: Diagnosis not present

## 2022-07-21 DIAGNOSIS — Z992 Dependence on renal dialysis: Secondary | ICD-10-CM | POA: Diagnosis not present

## 2022-07-21 DIAGNOSIS — D509 Iron deficiency anemia, unspecified: Secondary | ICD-10-CM | POA: Diagnosis not present

## 2022-07-21 DIAGNOSIS — N2581 Secondary hyperparathyroidism of renal origin: Secondary | ICD-10-CM | POA: Diagnosis not present

## 2022-07-21 DIAGNOSIS — D689 Coagulation defect, unspecified: Secondary | ICD-10-CM | POA: Diagnosis not present

## 2022-07-21 DIAGNOSIS — E875 Hyperkalemia: Secondary | ICD-10-CM | POA: Diagnosis not present

## 2022-07-24 DIAGNOSIS — D689 Coagulation defect, unspecified: Secondary | ICD-10-CM | POA: Diagnosis not present

## 2022-07-24 DIAGNOSIS — N2581 Secondary hyperparathyroidism of renal origin: Secondary | ICD-10-CM | POA: Diagnosis not present

## 2022-07-24 DIAGNOSIS — D509 Iron deficiency anemia, unspecified: Secondary | ICD-10-CM | POA: Diagnosis not present

## 2022-07-24 DIAGNOSIS — N186 End stage renal disease: Secondary | ICD-10-CM | POA: Diagnosis not present

## 2022-07-24 DIAGNOSIS — Z992 Dependence on renal dialysis: Secondary | ICD-10-CM | POA: Diagnosis not present

## 2022-07-24 DIAGNOSIS — E875 Hyperkalemia: Secondary | ICD-10-CM | POA: Diagnosis not present

## 2022-07-25 DIAGNOSIS — K5909 Other constipation: Secondary | ICD-10-CM | POA: Diagnosis not present

## 2022-07-25 DIAGNOSIS — N186 End stage renal disease: Secondary | ICD-10-CM | POA: Diagnosis not present

## 2022-07-25 DIAGNOSIS — E785 Hyperlipidemia, unspecified: Secondary | ICD-10-CM | POA: Diagnosis not present

## 2022-07-25 DIAGNOSIS — Z992 Dependence on renal dialysis: Secondary | ICD-10-CM | POA: Diagnosis not present

## 2022-07-25 DIAGNOSIS — J309 Allergic rhinitis, unspecified: Secondary | ICD-10-CM | POA: Diagnosis not present

## 2022-07-25 DIAGNOSIS — M109 Gout, unspecified: Secondary | ICD-10-CM | POA: Diagnosis not present

## 2022-07-25 DIAGNOSIS — I5020 Unspecified systolic (congestive) heart failure: Secondary | ICD-10-CM | POA: Diagnosis not present

## 2022-07-25 DIAGNOSIS — J45909 Unspecified asthma, uncomplicated: Secondary | ICD-10-CM | POA: Diagnosis not present

## 2022-07-25 DIAGNOSIS — I110 Hypertensive heart disease with heart failure: Secondary | ICD-10-CM | POA: Diagnosis not present

## 2022-07-26 DIAGNOSIS — D509 Iron deficiency anemia, unspecified: Secondary | ICD-10-CM | POA: Diagnosis not present

## 2022-07-26 DIAGNOSIS — N186 End stage renal disease: Secondary | ICD-10-CM | POA: Diagnosis not present

## 2022-07-26 DIAGNOSIS — N2581 Secondary hyperparathyroidism of renal origin: Secondary | ICD-10-CM | POA: Diagnosis not present

## 2022-07-26 DIAGNOSIS — E875 Hyperkalemia: Secondary | ICD-10-CM | POA: Diagnosis not present

## 2022-07-26 DIAGNOSIS — D689 Coagulation defect, unspecified: Secondary | ICD-10-CM | POA: Diagnosis not present

## 2022-07-26 DIAGNOSIS — Z992 Dependence on renal dialysis: Secondary | ICD-10-CM | POA: Diagnosis not present

## 2022-07-28 DIAGNOSIS — D509 Iron deficiency anemia, unspecified: Secondary | ICD-10-CM | POA: Diagnosis not present

## 2022-07-28 DIAGNOSIS — N186 End stage renal disease: Secondary | ICD-10-CM | POA: Diagnosis not present

## 2022-07-28 DIAGNOSIS — E875 Hyperkalemia: Secondary | ICD-10-CM | POA: Diagnosis not present

## 2022-07-28 DIAGNOSIS — N2581 Secondary hyperparathyroidism of renal origin: Secondary | ICD-10-CM | POA: Diagnosis not present

## 2022-07-28 DIAGNOSIS — D689 Coagulation defect, unspecified: Secondary | ICD-10-CM | POA: Diagnosis not present

## 2022-07-28 DIAGNOSIS — Z992 Dependence on renal dialysis: Secondary | ICD-10-CM | POA: Diagnosis not present

## 2022-07-31 DIAGNOSIS — D509 Iron deficiency anemia, unspecified: Secondary | ICD-10-CM | POA: Diagnosis not present

## 2022-07-31 DIAGNOSIS — N2581 Secondary hyperparathyroidism of renal origin: Secondary | ICD-10-CM | POA: Diagnosis not present

## 2022-07-31 DIAGNOSIS — D689 Coagulation defect, unspecified: Secondary | ICD-10-CM | POA: Diagnosis not present

## 2022-07-31 DIAGNOSIS — N186 End stage renal disease: Secondary | ICD-10-CM | POA: Diagnosis not present

## 2022-07-31 DIAGNOSIS — Z992 Dependence on renal dialysis: Secondary | ICD-10-CM | POA: Diagnosis not present

## 2022-07-31 DIAGNOSIS — E875 Hyperkalemia: Secondary | ICD-10-CM | POA: Diagnosis not present

## 2022-08-02 DIAGNOSIS — N2581 Secondary hyperparathyroidism of renal origin: Secondary | ICD-10-CM | POA: Diagnosis not present

## 2022-08-02 DIAGNOSIS — D689 Coagulation defect, unspecified: Secondary | ICD-10-CM | POA: Diagnosis not present

## 2022-08-02 DIAGNOSIS — E875 Hyperkalemia: Secondary | ICD-10-CM | POA: Diagnosis not present

## 2022-08-02 DIAGNOSIS — Z23 Encounter for immunization: Secondary | ICD-10-CM | POA: Diagnosis not present

## 2022-08-02 DIAGNOSIS — Z992 Dependence on renal dialysis: Secondary | ICD-10-CM | POA: Diagnosis not present

## 2022-08-02 DIAGNOSIS — D509 Iron deficiency anemia, unspecified: Secondary | ICD-10-CM | POA: Diagnosis not present

## 2022-08-02 DIAGNOSIS — N186 End stage renal disease: Secondary | ICD-10-CM | POA: Diagnosis not present

## 2022-08-02 DIAGNOSIS — I129 Hypertensive chronic kidney disease with stage 1 through stage 4 chronic kidney disease, or unspecified chronic kidney disease: Secondary | ICD-10-CM | POA: Diagnosis not present

## 2022-08-02 DIAGNOSIS — Z01818 Encounter for other preprocedural examination: Secondary | ICD-10-CM | POA: Diagnosis not present

## 2022-08-04 DIAGNOSIS — E875 Hyperkalemia: Secondary | ICD-10-CM | POA: Diagnosis not present

## 2022-08-04 DIAGNOSIS — D689 Coagulation defect, unspecified: Secondary | ICD-10-CM | POA: Diagnosis not present

## 2022-08-04 DIAGNOSIS — N2581 Secondary hyperparathyroidism of renal origin: Secondary | ICD-10-CM | POA: Diagnosis not present

## 2022-08-04 DIAGNOSIS — D509 Iron deficiency anemia, unspecified: Secondary | ICD-10-CM | POA: Diagnosis not present

## 2022-08-04 DIAGNOSIS — Z992 Dependence on renal dialysis: Secondary | ICD-10-CM | POA: Diagnosis not present

## 2022-08-04 DIAGNOSIS — N186 End stage renal disease: Secondary | ICD-10-CM | POA: Diagnosis not present

## 2022-08-07 DIAGNOSIS — D689 Coagulation defect, unspecified: Secondary | ICD-10-CM | POA: Diagnosis not present

## 2022-08-07 DIAGNOSIS — Z992 Dependence on renal dialysis: Secondary | ICD-10-CM | POA: Diagnosis not present

## 2022-08-07 DIAGNOSIS — N186 End stage renal disease: Secondary | ICD-10-CM | POA: Diagnosis not present

## 2022-08-07 DIAGNOSIS — Z7682 Awaiting organ transplant status: Secondary | ICD-10-CM | POA: Diagnosis not present

## 2022-08-07 DIAGNOSIS — N2581 Secondary hyperparathyroidism of renal origin: Secondary | ICD-10-CM | POA: Diagnosis not present

## 2022-08-07 DIAGNOSIS — E875 Hyperkalemia: Secondary | ICD-10-CM | POA: Diagnosis not present

## 2022-08-07 DIAGNOSIS — D509 Iron deficiency anemia, unspecified: Secondary | ICD-10-CM | POA: Diagnosis not present

## 2022-08-09 DIAGNOSIS — D509 Iron deficiency anemia, unspecified: Secondary | ICD-10-CM | POA: Diagnosis not present

## 2022-08-09 DIAGNOSIS — D689 Coagulation defect, unspecified: Secondary | ICD-10-CM | POA: Diagnosis not present

## 2022-08-09 DIAGNOSIS — N186 End stage renal disease: Secondary | ICD-10-CM | POA: Diagnosis not present

## 2022-08-09 DIAGNOSIS — Z992 Dependence on renal dialysis: Secondary | ICD-10-CM | POA: Diagnosis not present

## 2022-08-09 DIAGNOSIS — N2581 Secondary hyperparathyroidism of renal origin: Secondary | ICD-10-CM | POA: Diagnosis not present

## 2022-08-09 DIAGNOSIS — E875 Hyperkalemia: Secondary | ICD-10-CM | POA: Diagnosis not present

## 2022-08-11 DIAGNOSIS — N2581 Secondary hyperparathyroidism of renal origin: Secondary | ICD-10-CM | POA: Diagnosis not present

## 2022-08-11 DIAGNOSIS — D689 Coagulation defect, unspecified: Secondary | ICD-10-CM | POA: Diagnosis not present

## 2022-08-11 DIAGNOSIS — Z992 Dependence on renal dialysis: Secondary | ICD-10-CM | POA: Diagnosis not present

## 2022-08-11 DIAGNOSIS — N186 End stage renal disease: Secondary | ICD-10-CM | POA: Diagnosis not present

## 2022-08-11 DIAGNOSIS — D509 Iron deficiency anemia, unspecified: Secondary | ICD-10-CM | POA: Diagnosis not present

## 2022-08-11 DIAGNOSIS — E875 Hyperkalemia: Secondary | ICD-10-CM | POA: Diagnosis not present

## 2022-08-14 DIAGNOSIS — D689 Coagulation defect, unspecified: Secondary | ICD-10-CM | POA: Diagnosis not present

## 2022-08-14 DIAGNOSIS — Z992 Dependence on renal dialysis: Secondary | ICD-10-CM | POA: Diagnosis not present

## 2022-08-14 DIAGNOSIS — D509 Iron deficiency anemia, unspecified: Secondary | ICD-10-CM | POA: Diagnosis not present

## 2022-08-14 DIAGNOSIS — E875 Hyperkalemia: Secondary | ICD-10-CM | POA: Diagnosis not present

## 2022-08-14 DIAGNOSIS — N186 End stage renal disease: Secondary | ICD-10-CM | POA: Diagnosis not present

## 2022-08-14 DIAGNOSIS — N2581 Secondary hyperparathyroidism of renal origin: Secondary | ICD-10-CM | POA: Diagnosis not present

## 2022-08-15 ENCOUNTER — Encounter (HOSPITAL_COMMUNITY): Payer: Self-pay | Admitting: Nephrology

## 2022-08-16 DIAGNOSIS — D689 Coagulation defect, unspecified: Secondary | ICD-10-CM | POA: Diagnosis not present

## 2022-08-16 DIAGNOSIS — Z992 Dependence on renal dialysis: Secondary | ICD-10-CM | POA: Diagnosis not present

## 2022-08-16 DIAGNOSIS — N2581 Secondary hyperparathyroidism of renal origin: Secondary | ICD-10-CM | POA: Diagnosis not present

## 2022-08-16 DIAGNOSIS — E875 Hyperkalemia: Secondary | ICD-10-CM | POA: Diagnosis not present

## 2022-08-16 DIAGNOSIS — D509 Iron deficiency anemia, unspecified: Secondary | ICD-10-CM | POA: Diagnosis not present

## 2022-08-16 DIAGNOSIS — N186 End stage renal disease: Secondary | ICD-10-CM | POA: Diagnosis not present

## 2022-08-17 ENCOUNTER — Other Ambulatory Visit (HOSPITAL_COMMUNITY): Payer: Self-pay | Admitting: Nephrology

## 2022-08-17 DIAGNOSIS — R188 Other ascites: Secondary | ICD-10-CM

## 2022-08-18 DIAGNOSIS — Z992 Dependence on renal dialysis: Secondary | ICD-10-CM | POA: Diagnosis not present

## 2022-08-18 DIAGNOSIS — D689 Coagulation defect, unspecified: Secondary | ICD-10-CM | POA: Diagnosis not present

## 2022-08-18 DIAGNOSIS — N2581 Secondary hyperparathyroidism of renal origin: Secondary | ICD-10-CM | POA: Diagnosis not present

## 2022-08-18 DIAGNOSIS — D509 Iron deficiency anemia, unspecified: Secondary | ICD-10-CM | POA: Diagnosis not present

## 2022-08-18 DIAGNOSIS — N186 End stage renal disease: Secondary | ICD-10-CM | POA: Diagnosis not present

## 2022-08-18 DIAGNOSIS — E875 Hyperkalemia: Secondary | ICD-10-CM | POA: Diagnosis not present

## 2022-08-21 DIAGNOSIS — N2581 Secondary hyperparathyroidism of renal origin: Secondary | ICD-10-CM | POA: Diagnosis not present

## 2022-08-21 DIAGNOSIS — Z992 Dependence on renal dialysis: Secondary | ICD-10-CM | POA: Diagnosis not present

## 2022-08-21 DIAGNOSIS — E875 Hyperkalemia: Secondary | ICD-10-CM | POA: Diagnosis not present

## 2022-08-21 DIAGNOSIS — D509 Iron deficiency anemia, unspecified: Secondary | ICD-10-CM | POA: Diagnosis not present

## 2022-08-21 DIAGNOSIS — N186 End stage renal disease: Secondary | ICD-10-CM | POA: Diagnosis not present

## 2022-08-21 DIAGNOSIS — D689 Coagulation defect, unspecified: Secondary | ICD-10-CM | POA: Diagnosis not present

## 2022-08-22 ENCOUNTER — Other Ambulatory Visit (HOSPITAL_COMMUNITY): Payer: Self-pay | Admitting: Nephrology

## 2022-08-22 ENCOUNTER — Ambulatory Visit (HOSPITAL_COMMUNITY)
Admission: RE | Admit: 2022-08-22 | Discharge: 2022-08-22 | Disposition: A | Discharge status: Home / Self Care | Payer: 59 | Source: Ambulatory Visit | Attending: Nephrology | Admitting: Nephrology

## 2022-08-22 DIAGNOSIS — R188 Other ascites: Secondary | ICD-10-CM

## 2022-08-22 DIAGNOSIS — N186 End stage renal disease: Secondary | ICD-10-CM | POA: Diagnosis not present

## 2022-08-22 MED ORDER — LIDOCAINE HCL 1 % IJ SOLN
INTRAMUSCULAR | Status: AC
  Filled 2022-08-22: qty 20

## 2022-08-22 NOTE — Progress Notes (Signed)
Patient presented to IR today for possible paracentesis. Limited ultrasound examination of the abdomen revealed no ascites. Image findings discussed with the patient. No procedure performed. Images saved in Epic.  Alwyn Ren, [place] [phone] 08/22/2022, 9:55 AM

## 2022-08-23 DIAGNOSIS — Z992 Dependence on renal dialysis: Secondary | ICD-10-CM | POA: Diagnosis not present

## 2022-08-23 DIAGNOSIS — N2581 Secondary hyperparathyroidism of renal origin: Secondary | ICD-10-CM | POA: Diagnosis not present

## 2022-08-23 DIAGNOSIS — D689 Coagulation defect, unspecified: Secondary | ICD-10-CM | POA: Diagnosis not present

## 2022-08-23 DIAGNOSIS — D509 Iron deficiency anemia, unspecified: Secondary | ICD-10-CM | POA: Diagnosis not present

## 2022-08-23 DIAGNOSIS — E875 Hyperkalemia: Secondary | ICD-10-CM | POA: Diagnosis not present

## 2022-08-23 DIAGNOSIS — N186 End stage renal disease: Secondary | ICD-10-CM | POA: Diagnosis not present

## 2022-08-25 DIAGNOSIS — N2581 Secondary hyperparathyroidism of renal origin: Secondary | ICD-10-CM | POA: Diagnosis not present

## 2022-08-25 DIAGNOSIS — E875 Hyperkalemia: Secondary | ICD-10-CM | POA: Diagnosis not present

## 2022-08-25 DIAGNOSIS — N186 End stage renal disease: Secondary | ICD-10-CM | POA: Diagnosis not present

## 2022-08-25 DIAGNOSIS — D689 Coagulation defect, unspecified: Secondary | ICD-10-CM | POA: Diagnosis not present

## 2022-08-25 DIAGNOSIS — D509 Iron deficiency anemia, unspecified: Secondary | ICD-10-CM | POA: Diagnosis not present

## 2022-08-25 DIAGNOSIS — Z992 Dependence on renal dialysis: Secondary | ICD-10-CM | POA: Diagnosis not present

## 2022-08-28 DIAGNOSIS — N186 End stage renal disease: Secondary | ICD-10-CM | POA: Diagnosis not present

## 2022-08-28 DIAGNOSIS — E875 Hyperkalemia: Secondary | ICD-10-CM | POA: Diagnosis not present

## 2022-08-28 DIAGNOSIS — Z992 Dependence on renal dialysis: Secondary | ICD-10-CM | POA: Diagnosis not present

## 2022-08-28 DIAGNOSIS — D509 Iron deficiency anemia, unspecified: Secondary | ICD-10-CM | POA: Diagnosis not present

## 2022-08-28 DIAGNOSIS — N2581 Secondary hyperparathyroidism of renal origin: Secondary | ICD-10-CM | POA: Diagnosis not present

## 2022-08-28 DIAGNOSIS — D689 Coagulation defect, unspecified: Secondary | ICD-10-CM | POA: Diagnosis not present

## 2022-08-30 DIAGNOSIS — N186 End stage renal disease: Secondary | ICD-10-CM | POA: Diagnosis not present

## 2022-08-30 DIAGNOSIS — D689 Coagulation defect, unspecified: Secondary | ICD-10-CM | POA: Diagnosis not present

## 2022-08-30 DIAGNOSIS — Z992 Dependence on renal dialysis: Secondary | ICD-10-CM | POA: Diagnosis not present

## 2022-08-30 DIAGNOSIS — N2581 Secondary hyperparathyroidism of renal origin: Secondary | ICD-10-CM | POA: Diagnosis not present

## 2022-08-30 DIAGNOSIS — E875 Hyperkalemia: Secondary | ICD-10-CM | POA: Diagnosis not present

## 2022-08-30 DIAGNOSIS — D509 Iron deficiency anemia, unspecified: Secondary | ICD-10-CM | POA: Diagnosis not present

## 2022-09-01 DIAGNOSIS — N186 End stage renal disease: Secondary | ICD-10-CM | POA: Diagnosis not present

## 2022-09-01 DIAGNOSIS — D509 Iron deficiency anemia, unspecified: Secondary | ICD-10-CM | POA: Diagnosis not present

## 2022-09-01 DIAGNOSIS — D689 Coagulation defect, unspecified: Secondary | ICD-10-CM | POA: Diagnosis not present

## 2022-09-01 DIAGNOSIS — Z992 Dependence on renal dialysis: Secondary | ICD-10-CM | POA: Diagnosis not present

## 2022-09-01 DIAGNOSIS — E875 Hyperkalemia: Secondary | ICD-10-CM | POA: Diagnosis not present

## 2022-09-01 DIAGNOSIS — N2581 Secondary hyperparathyroidism of renal origin: Secondary | ICD-10-CM | POA: Diagnosis not present

## 2022-09-02 DIAGNOSIS — N186 End stage renal disease: Secondary | ICD-10-CM | POA: Diagnosis not present

## 2022-09-02 DIAGNOSIS — I129 Hypertensive chronic kidney disease with stage 1 through stage 4 chronic kidney disease, or unspecified chronic kidney disease: Secondary | ICD-10-CM | POA: Diagnosis not present

## 2022-09-02 DIAGNOSIS — Z992 Dependence on renal dialysis: Secondary | ICD-10-CM | POA: Diagnosis not present

## 2022-09-04 DIAGNOSIS — D689 Coagulation defect, unspecified: Secondary | ICD-10-CM | POA: Diagnosis not present

## 2022-09-04 DIAGNOSIS — N186 End stage renal disease: Secondary | ICD-10-CM | POA: Diagnosis not present

## 2022-09-04 DIAGNOSIS — E875 Hyperkalemia: Secondary | ICD-10-CM | POA: Diagnosis not present

## 2022-09-04 DIAGNOSIS — Z992 Dependence on renal dialysis: Secondary | ICD-10-CM | POA: Diagnosis not present

## 2022-09-04 DIAGNOSIS — N2581 Secondary hyperparathyroidism of renal origin: Secondary | ICD-10-CM | POA: Diagnosis not present

## 2022-09-05 ENCOUNTER — Telehealth (HOSPITAL_COMMUNITY): Payer: Self-pay | Admitting: Cardiology

## 2022-09-05 MED ORDER — MIDODRINE HCL 10 MG PO TABS: 15.0000 mg | ORAL_TABLET | ORAL | 3 refills | Status: DC

## 2022-09-05 NOTE — Telephone Encounter (Signed)
PT AWARE  

## 2022-09-05 NOTE — Telephone Encounter (Signed)
Agree with holding coreg. Increase midodrine to 15 mg on dialysis days. Further HD management per Nephrology.

## 2022-09-05 NOTE — Telephone Encounter (Signed)
Patient called to report he feels horrible after every dialysis treatment due to b/p dropping so low Reports b/p was 74/66 at last treatment  Reports nephrology d/c'd coreg x 1 week ago with no improvement  Reports compliance with midodrine 10 mg 1 hr prior to treatment.  No follow up with nephrology   Please advise further from a cardiology stand point

## 2022-09-06 ENCOUNTER — Emergency Department (HOSPITAL_COMMUNITY)
Admission: EM | Admit: 2022-09-06 | Discharge: 2022-09-06 | Disposition: A | Discharge status: Home / Self Care | Payer: 59 | Attending: Emergency Medicine | Admitting: Emergency Medicine

## 2022-09-06 ENCOUNTER — Encounter (HOSPITAL_COMMUNITY): Payer: Self-pay

## 2022-09-06 ENCOUNTER — Emergency Department (HOSPITAL_COMMUNITY): Payer: 59

## 2022-09-06 ENCOUNTER — Other Ambulatory Visit: Payer: Self-pay

## 2022-09-06 DIAGNOSIS — Z743 Need for continuous supervision: Secondary | ICD-10-CM | POA: Diagnosis not present

## 2022-09-06 DIAGNOSIS — Z7682 Awaiting organ transplant status: Secondary | ICD-10-CM | POA: Insufficient documentation

## 2022-09-06 DIAGNOSIS — N2581 Secondary hyperparathyroidism of renal origin: Secondary | ICD-10-CM | POA: Diagnosis not present

## 2022-09-06 DIAGNOSIS — I959 Hypotension, unspecified: Secondary | ICD-10-CM | POA: Insufficient documentation

## 2022-09-06 DIAGNOSIS — Z79899 Other long term (current) drug therapy: Secondary | ICD-10-CM | POA: Insufficient documentation

## 2022-09-06 DIAGNOSIS — R079 Chest pain, unspecified: Secondary | ICD-10-CM | POA: Diagnosis not present

## 2022-09-06 DIAGNOSIS — R0602 Shortness of breath: Secondary | ICD-10-CM | POA: Insufficient documentation

## 2022-09-06 DIAGNOSIS — Z7982 Long term (current) use of aspirin: Secondary | ICD-10-CM | POA: Diagnosis not present

## 2022-09-06 DIAGNOSIS — N186 End stage renal disease: Secondary | ICD-10-CM | POA: Diagnosis not present

## 2022-09-06 DIAGNOSIS — I700 Atherosclerosis of aorta: Secondary | ICD-10-CM | POA: Diagnosis not present

## 2022-09-06 DIAGNOSIS — E875 Hyperkalemia: Secondary | ICD-10-CM | POA: Diagnosis not present

## 2022-09-06 DIAGNOSIS — R6889 Other general symptoms and signs: Secondary | ICD-10-CM | POA: Diagnosis not present

## 2022-09-06 DIAGNOSIS — Z992 Dependence on renal dialysis: Secondary | ICD-10-CM | POA: Diagnosis not present

## 2022-09-06 DIAGNOSIS — R0789 Other chest pain: Secondary | ICD-10-CM | POA: Diagnosis not present

## 2022-09-06 DIAGNOSIS — R404 Transient alteration of awareness: Secondary | ICD-10-CM | POA: Diagnosis not present

## 2022-09-06 DIAGNOSIS — D689 Coagulation defect, unspecified: Secondary | ICD-10-CM | POA: Diagnosis not present

## 2022-09-06 LAB — COMPREHENSIVE METABOLIC PANEL
ALT: 19 U/L (ref 0–44)
AST: 20 U/L (ref 15–41)
Albumin: 3.2 g/dL — ABNORMAL LOW (ref 3.5–5.0)
Alkaline Phosphatase: 51 U/L (ref 38–126)
Anion gap: 19 — ABNORMAL HIGH (ref 5–15)
BUN: 18 mg/dL (ref 6–20)
CO2: 28 mmol/L (ref 22–32)
Calcium: 9.2 mg/dL (ref 8.9–10.3)
Chloride: 92 mmol/L — ABNORMAL LOW (ref 98–111)
Creatinine, Ser: 7.31 mg/dL — ABNORMAL HIGH (ref 0.61–1.24)
GFR, Estimated: 8 mL/min — ABNORMAL LOW (ref >60)
Glucose, Bld: 83 mg/dL (ref 70–99)
Potassium: 3.8 mmol/L (ref 3.5–5.1)
Sodium: 139 mmol/L (ref 135–145)
Total Bilirubin: 0.2 mg/dL — ABNORMAL LOW (ref 0.3–1.2)
Total Protein: 6.7 g/dL (ref 6.5–8.1)

## 2022-09-06 LAB — CBC WITH DIFFERENTIAL/PLATELET
Abs Immature Granulocytes: 0 10*3/uL (ref 0.00–0.07)
Basophils Absolute: 0.1 10*3/uL (ref 0.0–0.1)
Basophils Relative: 2 %
Eosinophils Absolute: 0.2 10*3/uL (ref 0.0–0.5)
Eosinophils Relative: 4 %
HCT: 40.3 % (ref 39.0–52.0)
Hemoglobin: 12.6 g/dL — ABNORMAL LOW (ref 13.0–17.0)
Immature Granulocytes: 0 %
Lymphocytes Relative: 20 %
Lymphs Abs: 0.9 10*3/uL (ref 0.7–4.0)
MCH: 30.1 pg (ref 26.0–34.0)
MCHC: 31.3 g/dL (ref 30.0–36.0)
MCV: 96.2 fL (ref 80.0–100.0)
Monocytes Absolute: 0.8 10*3/uL (ref 0.1–1.0)
Monocytes Relative: 17 %
Neutro Abs: 2.7 10*3/uL (ref 1.7–7.7)
Neutrophils Relative %: 57 %
Platelets: 220 10*3/uL (ref 150–400)
RBC: 4.19 MIL/uL — ABNORMAL LOW (ref 4.22–5.81)
RDW: 16.1 % — ABNORMAL HIGH (ref 11.5–15.5)
WBC: 4.6 10*3/uL (ref 4.0–10.5)
nRBC: 0 % (ref 0.0–0.2)

## 2022-09-06 LAB — TROPONIN I (HIGH SENSITIVITY)
Troponin I (High Sensitivity): 15 ng/L (ref <18)
Troponin I (High Sensitivity): 16 ng/L (ref <18)

## 2022-09-06 LAB — BRAIN NATRIURETIC PEPTIDE: B Natriuretic Peptide: 35.2 pg/mL (ref 0.0–100.0)

## 2022-09-06 MED ORDER — ACETAMINOPHEN 325 MG PO TABS
650.0000 mg | ORAL_TABLET | Freq: Once | ORAL | Status: AC
  Administered 2022-09-06: 650 mg via ORAL
  Filled 2022-09-06: qty 2

## 2022-09-06 MED ORDER — SODIUM CHLORIDE 0.9 % IV BOLUS
500.0000 mL | Freq: Once | INTRAVENOUS | Status: AC
  Administered 2022-09-06: 500 mL via INTRAVENOUS

## 2022-09-06 NOTE — Discharge Instructions (Addendum)
Dear Vernon Blackburn, It was a pleasure taking care of you at [facility] ED. We are discharging you home now that you are doing better and all your labs and imaging was not concerning for any indication that warrants an admission. Please follow the following instructions.  1) Please follow up with your nephrology to adjust your medications as needed 2) Please don't hesitate to contact us if you have any concerns. 3) Your potassium was 3.2 today. Please don't take your [facility] until your see your nephrologist after dialysis on Friday  Take care,  Dr. Kathleen Lime, MD

## 2022-09-06 NOTE — ED Triage Notes (Signed)
PT BIB EMS for chest pain for about 20 minutes, upon arrival 73/49, pt did have dialysis this morning, 500 ml NS 100/68, pt states feeling some better with pain and dizziness.   324 Aspirin SR 20 g Right AC

## 2022-09-06 NOTE — ED Provider Notes (Signed)
EMERGENCY DEPARTMENT AT [facility] Provider Note   CSN: [id number] Arrival date & time: 09/06/22  1710     History  Chief Complaint  Patient presents with   Chest Pain    Hypotension    Vernon Blackburn is a 57 y.o. male.  HPI This is a 57 year old male history of end-stage renal disease on dialysis Mondays Wednesdays and Fridays presenting to the ED due to concerns of feeling dizzy, hypotensive, shortness of breath and  chest pain that he describes as " someone sitting on my chest" . Patient has dialysis , Monday, wed and Fridays,on transplant list.  Pt said this happens anytime he gets out of dialysis and he takes midodrine 10 mg  after each dialysis but that didn't help this time around. Patient any denies any changes in his dialysis this morning, says no extra fluid sessions or extra fluids were taking out. Pt also takes Lokelma  for hyperkalemia .    Home Medications Prior to Admission medications   Medication Sig Start Date End Date Taking? Authorizing Provider  Albuterol Sulfate (PROAIR RESPICLICK) 108 (90 Base) MCG/ACT AEPB Inhale 2 puffs into the lungs every 6 (six) hours as needed (wheezing/shortness of breath).    [provider]  aspirin EC 81 MG tablet Take 81 mg by mouth daily. Swallow whole.    [provider]  betamethasone 0.5% cream-vitamin a&d ointment 1:1 mixture Apply topically.    [provider]  calcium acetate (PHOSLO) 667 MG capsule Take 2,668 mg by mouth 3 (three) times daily with meals. 11/22/19   [provider]  carvedilol (COREG) 3.125 MG tablet Take 1 tablet (3.125 mg total) by mouth 2 (two) times daily with a meal. 06/01/22   Laurey Morale, MD  lactulose [facility]) 10 GM/15ML solution Take 10 g by mouth 2 (two) times daily as needed for mild constipation.    [provider]  lidocaine-prilocaine (EMLA) cream Apply 1 application topically daily as needed (prior to port being  access). Patient not taking: Reported on 03/07/2022 07/27/20   [provider]  midodrine (PROAMATINE) 10 MG tablet Take 1.5 tablets (15 mg total) by mouth every Monday, Wednesday, and Friday with hemodialysis. 09/06/22   Laurey Morale, MD  polyethylene glycol (MIRALAX / GLYCOLAX) 17 g packet Take 17 g by mouth every other day.    [provider]  VYVANSE 30 MG capsule Take 30 mg by mouth every morning. 06/21/19   [provider]      Allergies    Chlorhexidine and Isosorbide    Review of Systems   Review of Systems  Physical Exam Updated Vital Signs BP 99/76   Pulse 78   Temp 97.9 F (36.6 C)   Resp 12   SpO2 100%  Physical Exam Constitutional:      General: He is not in acute distress.    Appearance: He is normal weight. He is not ill-appearing, toxic-appearing or diaphoretic.  Cardiovascular:     Rate and Rhythm: Normal rate and regular rhythm.     Pulses: Normal pulses.     Heart sounds: Normal heart sounds.  Abdominal:     General: Abdomen is flat.     Palpations: Abdomen is soft.  Neurological:     General: No focal deficit present.     Mental Status: He is alert and oriented to person, place, and time.     ED Results / Procedures / Treatments   Labs (all labs  ordered are listed, but only abnormal results are displayed) Labs Reviewed  CBC WITH DIFFERENTIAL/PLATELET - Abnormal; Notable for the following components:      Result Value   RBC 4.19 (*)    Hemoglobin 12.6 (*)    RDW 16.1 (*)    All other components within normal limits  COMPREHENSIVE METABOLIC PANEL - Abnormal; Notable for the following components:   Chloride 92 (*)    Creatinine, Ser 7.31 (*)    Albumin 3.2 (*)    Total Bilirubin 0.2 (*)    GFR, Estimated 8 (*)    Anion gap 19 (*)    All other components within normal limits  BRAIN NATRIURETIC PEPTIDE  TROPONIN I (HIGH SENSITIVITY)  TROPONIN I (HIGH SENSITIVITY)    EKG EKG Interpretation Date/Time:  Wednesday  September 06 2022 17:21:00 EDT Ventricular Rate:  80 PR Interval:  190 QRS Duration:  100 QT Interval:  403 QTC Calculation: 465 R Axis:   45  Text Interpretation: Sinus rhythm Probable left atrial enlargement No significant change since last tracing Confirmed by Richardean Canal ([id number]) on 09/06/2022 5:33:18 PM  Radiology DG Chest Port 1 View  Result Date: 09/06/2022 CLINICAL DATA:  Chest pain EXAM: PORTABLE CHEST 1 VIEW COMPARISON:  12/17/2019 FINDINGS: Stable cardiomediastinal silhouette. Aortic atherosclerotic calcification. No focal consolidation, pleural effusion, or pneumothorax. No displaced rib fractures. IMPRESSION: No acute cardiopulmonary disease. Electronically Signed   By: Minerva Fester M.D.   On: 09/06/2022 20:12    Procedures Procedures    Medications Ordered in ED Medications  sodium chloride 0.9 % bolus 500 mL (0 mLs Intravenous Stopped 09/06/22 1845)  acetaminophen (TYLENOL) tablet 650 mg (650 mg Oral Given 09/06/22 2106)    ED Course/ Medical Decision Making/ A&P                                 Medical Decision Making  This patient is a 57 y.o. male who presents to the ED for concern of hypotension,dizziness and chest pain after dialysis, this involves an extensive number of treatment options, and is a complaint that carries with it a high risk of complications and morbidity. The emergent differential diagnosis prior to evaluation includes, but is not limited to,dialysis hypotension, ACS.This is not an exhaustive differential.   Physical Exam: Physical exam performed. The pertinent findings include: No signs of dehydration.Normal skin turgor   Lab Tests: I ordered, and personally interpreted labs.  The pertinent results include:  BNP of 35.2 less concerning for volume overload.Troponin of 15,less concern of myocardial injury at this time.I repeated the trops and still came back normal.   Imaging Studies: I ordered imaging studies including CXR. I independently  visualized and interpreted imaging which showed no acute processes at this time . I agree with the radiologist interpretation.   Cardiac Monitoring:  The patient was maintained on a cardiac monitor.  My attending physician Dr. Silverio Lay viewed and interpreted the cardiac monitored which showed sinus rhythm. I agree with this interpretation.   Medications: I ordered medication including acetaminophen   for pain . Reevaluation of the patient after these medicines showed that the patient improved. I have reviewed the patients home medicines and have have advised patient to follow up with his nephrologist and cardiologist for adjustment if needed   Disposition: After consideration of the diagnostic results and the patients response to treatment, I feel that the emergency department workup does not suggest an  emergent condition requiring admission or immediate intervention beyond what has been performed at this time. The plan is to discharge patient home. The patient is safe for discharge and has been instructed to return immediately for worsening symptoms, change in symptoms or any other concerns.  I discussed this case with my attending physician Dr. Silverio Lay who cosigned this note including patient's presenting symptoms, physical exam, and planned diagnostics and interventions. Attending physician stated agreement with plan or made changes to plan which were implemented.           Final Clinical Impression(s) / ED Diagnoses Final diagnoses:  Chest pain, unspecified type  Hypotension, unspecified hypotension type    Rx / DC Orders ED Discharge Orders     None         Kathleen Lime, MD 09/06/22 2147    Charlynne Pander, MD 09/11/22 [phone]

## 2022-09-08 DIAGNOSIS — E875 Hyperkalemia: Secondary | ICD-10-CM | POA: Diagnosis not present

## 2022-09-08 DIAGNOSIS — D689 Coagulation defect, unspecified: Secondary | ICD-10-CM | POA: Diagnosis not present

## 2022-09-08 DIAGNOSIS — N186 End stage renal disease: Secondary | ICD-10-CM | POA: Diagnosis not present

## 2022-09-08 DIAGNOSIS — Z992 Dependence on renal dialysis: Secondary | ICD-10-CM | POA: Diagnosis not present

## 2022-09-08 DIAGNOSIS — N2581 Secondary hyperparathyroidism of renal origin: Secondary | ICD-10-CM | POA: Diagnosis not present

## 2022-09-11 DIAGNOSIS — Z992 Dependence on renal dialysis: Secondary | ICD-10-CM | POA: Diagnosis not present

## 2022-09-11 DIAGNOSIS — N2581 Secondary hyperparathyroidism of renal origin: Secondary | ICD-10-CM | POA: Diagnosis not present

## 2022-09-11 DIAGNOSIS — N186 End stage renal disease: Secondary | ICD-10-CM | POA: Diagnosis not present

## 2022-09-11 DIAGNOSIS — D689 Coagulation defect, unspecified: Secondary | ICD-10-CM | POA: Diagnosis not present

## 2022-09-13 DIAGNOSIS — D509 Iron deficiency anemia, unspecified: Secondary | ICD-10-CM | POA: Diagnosis not present

## 2022-09-13 DIAGNOSIS — D689 Coagulation defect, unspecified: Secondary | ICD-10-CM | POA: Diagnosis not present

## 2022-09-13 DIAGNOSIS — N186 End stage renal disease: Secondary | ICD-10-CM | POA: Diagnosis not present

## 2022-09-13 DIAGNOSIS — E875 Hyperkalemia: Secondary | ICD-10-CM | POA: Diagnosis not present

## 2022-09-13 DIAGNOSIS — Z992 Dependence on renal dialysis: Secondary | ICD-10-CM | POA: Diagnosis not present

## 2022-09-13 DIAGNOSIS — N2581 Secondary hyperparathyroidism of renal origin: Secondary | ICD-10-CM | POA: Diagnosis not present

## 2022-09-15 DIAGNOSIS — D509 Iron deficiency anemia, unspecified: Secondary | ICD-10-CM | POA: Diagnosis not present

## 2022-09-15 DIAGNOSIS — N186 End stage renal disease: Secondary | ICD-10-CM | POA: Diagnosis not present

## 2022-09-15 DIAGNOSIS — D689 Coagulation defect, unspecified: Secondary | ICD-10-CM | POA: Diagnosis not present

## 2022-09-15 DIAGNOSIS — N2581 Secondary hyperparathyroidism of renal origin: Secondary | ICD-10-CM | POA: Diagnosis not present

## 2022-09-15 DIAGNOSIS — E875 Hyperkalemia: Secondary | ICD-10-CM | POA: Diagnosis not present

## 2022-09-15 DIAGNOSIS — Z992 Dependence on renal dialysis: Secondary | ICD-10-CM | POA: Diagnosis not present

## 2022-09-18 DIAGNOSIS — E875 Hyperkalemia: Secondary | ICD-10-CM | POA: Diagnosis not present

## 2022-09-18 DIAGNOSIS — N2581 Secondary hyperparathyroidism of renal origin: Secondary | ICD-10-CM | POA: Diagnosis not present

## 2022-09-18 DIAGNOSIS — D689 Coagulation defect, unspecified: Secondary | ICD-10-CM | POA: Diagnosis not present

## 2022-09-18 DIAGNOSIS — D509 Iron deficiency anemia, unspecified: Secondary | ICD-10-CM | POA: Diagnosis not present

## 2022-09-18 DIAGNOSIS — Z992 Dependence on renal dialysis: Secondary | ICD-10-CM | POA: Diagnosis not present

## 2022-09-18 DIAGNOSIS — N186 End stage renal disease: Secondary | ICD-10-CM | POA: Diagnosis not present

## 2022-09-20 DIAGNOSIS — N2581 Secondary hyperparathyroidism of renal origin: Secondary | ICD-10-CM | POA: Diagnosis not present

## 2022-09-20 DIAGNOSIS — E875 Hyperkalemia: Secondary | ICD-10-CM | POA: Diagnosis not present

## 2022-09-20 DIAGNOSIS — N186 End stage renal disease: Secondary | ICD-10-CM | POA: Diagnosis not present

## 2022-09-20 DIAGNOSIS — Z992 Dependence on renal dialysis: Secondary | ICD-10-CM | POA: Diagnosis not present

## 2022-09-20 DIAGNOSIS — D509 Iron deficiency anemia, unspecified: Secondary | ICD-10-CM | POA: Diagnosis not present

## 2022-09-20 DIAGNOSIS — D689 Coagulation defect, unspecified: Secondary | ICD-10-CM | POA: Diagnosis not present

## 2022-09-21 DIAGNOSIS — T82858A Stenosis of vascular prosthetic devices, implants and grafts, initial encounter: Secondary | ICD-10-CM | POA: Diagnosis not present

## 2022-09-21 DIAGNOSIS — I871 Compression of vein: Secondary | ICD-10-CM | POA: Diagnosis not present

## 2022-09-21 DIAGNOSIS — N186 End stage renal disease: Secondary | ICD-10-CM | POA: Diagnosis not present

## 2022-09-21 DIAGNOSIS — Z992 Dependence on renal dialysis: Secondary | ICD-10-CM | POA: Diagnosis not present

## 2022-09-22 DIAGNOSIS — N186 End stage renal disease: Secondary | ICD-10-CM | POA: Diagnosis not present

## 2022-09-22 DIAGNOSIS — D509 Iron deficiency anemia, unspecified: Secondary | ICD-10-CM | POA: Diagnosis not present

## 2022-09-22 DIAGNOSIS — N2581 Secondary hyperparathyroidism of renal origin: Secondary | ICD-10-CM | POA: Diagnosis not present

## 2022-09-22 DIAGNOSIS — D689 Coagulation defect, unspecified: Secondary | ICD-10-CM | POA: Diagnosis not present

## 2022-09-22 DIAGNOSIS — E875 Hyperkalemia: Secondary | ICD-10-CM | POA: Diagnosis not present

## 2022-09-22 DIAGNOSIS — Z992 Dependence on renal dialysis: Secondary | ICD-10-CM | POA: Diagnosis not present

## 2022-09-25 DIAGNOSIS — E875 Hyperkalemia: Secondary | ICD-10-CM | POA: Diagnosis not present

## 2022-09-25 DIAGNOSIS — N186 End stage renal disease: Secondary | ICD-10-CM | POA: Diagnosis not present

## 2022-09-25 DIAGNOSIS — N2581 Secondary hyperparathyroidism of renal origin: Secondary | ICD-10-CM | POA: Diagnosis not present

## 2022-09-25 DIAGNOSIS — D689 Coagulation defect, unspecified: Secondary | ICD-10-CM | POA: Diagnosis not present

## 2022-09-25 DIAGNOSIS — D509 Iron deficiency anemia, unspecified: Secondary | ICD-10-CM | POA: Diagnosis not present

## 2022-09-25 DIAGNOSIS — Z992 Dependence on renal dialysis: Secondary | ICD-10-CM | POA: Diagnosis not present

## 2022-09-26 ENCOUNTER — Telehealth: Payer: Self-pay

## 2022-09-26 NOTE — Telephone Encounter (Signed)
Transition Care Management Unsuccessful Follow-up Telephone Call  Date of discharge and from where:  [facility] 9/4  Attempts:  1st Attempt  Reason for unsuccessful TCM follow-up call:  No answer/busy   Lenard Forth [place]  [facility], [facility] Guide, Phone: [phone] Website: [email or web address]

## 2022-09-27 DIAGNOSIS — E875 Hyperkalemia: Secondary | ICD-10-CM | POA: Diagnosis not present

## 2022-09-27 DIAGNOSIS — N2581 Secondary hyperparathyroidism of renal origin: Secondary | ICD-10-CM | POA: Diagnosis not present

## 2022-09-27 DIAGNOSIS — Z992 Dependence on renal dialysis: Secondary | ICD-10-CM | POA: Diagnosis not present

## 2022-09-27 DIAGNOSIS — D509 Iron deficiency anemia, unspecified: Secondary | ICD-10-CM | POA: Diagnosis not present

## 2022-09-27 DIAGNOSIS — N186 End stage renal disease: Secondary | ICD-10-CM | POA: Diagnosis not present

## 2022-09-27 DIAGNOSIS — D689 Coagulation defect, unspecified: Secondary | ICD-10-CM | POA: Diagnosis not present

## 2022-09-28 ENCOUNTER — Telehealth: Payer: Self-pay

## 2022-09-28 NOTE — Telephone Encounter (Signed)
Transition Care Management Unsuccessful Follow-up Telephone Call  Date of discharge and from where:  [facility] 9/4  Attempts:  2nd Attempt  Reason for unsuccessful TCM follow-up call:  No answer/busy   Lenard Forth   [facility], [facility] Guide, Phone: [phone] Website: [email or web address]

## 2022-09-29 ENCOUNTER — Inpatient Hospital Stay (HOSPITAL_COMMUNITY): Admission: RE | Admit: 2022-09-29 | Payer: 59 | Source: Ambulatory Visit | Admitting: Cardiology

## 2022-09-29 DIAGNOSIS — N2581 Secondary hyperparathyroidism of renal origin: Secondary | ICD-10-CM | POA: Diagnosis not present

## 2022-09-29 DIAGNOSIS — N186 End stage renal disease: Secondary | ICD-10-CM | POA: Diagnosis not present

## 2022-09-29 DIAGNOSIS — Z992 Dependence on renal dialysis: Secondary | ICD-10-CM | POA: Diagnosis not present

## 2022-09-29 DIAGNOSIS — D689 Coagulation defect, unspecified: Secondary | ICD-10-CM | POA: Diagnosis not present

## 2022-09-29 DIAGNOSIS — D509 Iron deficiency anemia, unspecified: Secondary | ICD-10-CM | POA: Diagnosis not present

## 2022-09-29 DIAGNOSIS — E875 Hyperkalemia: Secondary | ICD-10-CM | POA: Diagnosis not present

## 2022-10-02 DIAGNOSIS — E875 Hyperkalemia: Secondary | ICD-10-CM | POA: Diagnosis not present

## 2022-10-02 DIAGNOSIS — I129 Hypertensive chronic kidney disease with stage 1 through stage 4 chronic kidney disease, or unspecified chronic kidney disease: Secondary | ICD-10-CM | POA: Diagnosis not present

## 2022-10-02 DIAGNOSIS — D689 Coagulation defect, unspecified: Secondary | ICD-10-CM | POA: Diagnosis not present

## 2022-10-02 DIAGNOSIS — N2581 Secondary hyperparathyroidism of renal origin: Secondary | ICD-10-CM | POA: Diagnosis not present

## 2022-10-02 DIAGNOSIS — Z992 Dependence on renal dialysis: Secondary | ICD-10-CM | POA: Diagnosis not present

## 2022-10-02 DIAGNOSIS — N186 End stage renal disease: Secondary | ICD-10-CM | POA: Diagnosis not present

## 2022-10-02 DIAGNOSIS — D509 Iron deficiency anemia, unspecified: Secondary | ICD-10-CM | POA: Diagnosis not present

## 2022-10-04 DIAGNOSIS — Z992 Dependence on renal dialysis: Secondary | ICD-10-CM | POA: Diagnosis not present

## 2022-10-04 DIAGNOSIS — D509 Iron deficiency anemia, unspecified: Secondary | ICD-10-CM | POA: Diagnosis not present

## 2022-10-04 DIAGNOSIS — N186 End stage renal disease: Secondary | ICD-10-CM | POA: Diagnosis not present

## 2022-10-04 DIAGNOSIS — Z23 Encounter for immunization: Secondary | ICD-10-CM | POA: Diagnosis not present

## 2022-10-04 DIAGNOSIS — D689 Coagulation defect, unspecified: Secondary | ICD-10-CM | POA: Diagnosis not present

## 2022-10-04 DIAGNOSIS — E875 Hyperkalemia: Secondary | ICD-10-CM | POA: Diagnosis not present

## 2022-10-04 DIAGNOSIS — N2581 Secondary hyperparathyroidism of renal origin: Secondary | ICD-10-CM | POA: Diagnosis not present

## 2022-10-06 DIAGNOSIS — Z23 Encounter for immunization: Secondary | ICD-10-CM | POA: Diagnosis not present

## 2022-10-06 DIAGNOSIS — D689 Coagulation defect, unspecified: Secondary | ICD-10-CM | POA: Diagnosis not present

## 2022-10-06 DIAGNOSIS — N2581 Secondary hyperparathyroidism of renal origin: Secondary | ICD-10-CM | POA: Diagnosis not present

## 2022-10-06 DIAGNOSIS — E875 Hyperkalemia: Secondary | ICD-10-CM | POA: Diagnosis not present

## 2022-10-06 DIAGNOSIS — D509 Iron deficiency anemia, unspecified: Secondary | ICD-10-CM | POA: Diagnosis not present

## 2022-10-06 DIAGNOSIS — N186 End stage renal disease: Secondary | ICD-10-CM | POA: Diagnosis not present

## 2022-10-06 DIAGNOSIS — Z992 Dependence on renal dialysis: Secondary | ICD-10-CM | POA: Diagnosis not present

## 2022-10-09 DIAGNOSIS — Z23 Encounter for immunization: Secondary | ICD-10-CM | POA: Diagnosis not present

## 2022-10-09 DIAGNOSIS — N186 End stage renal disease: Secondary | ICD-10-CM | POA: Diagnosis not present

## 2022-10-09 DIAGNOSIS — N2581 Secondary hyperparathyroidism of renal origin: Secondary | ICD-10-CM | POA: Diagnosis not present

## 2022-10-09 DIAGNOSIS — E875 Hyperkalemia: Secondary | ICD-10-CM | POA: Diagnosis not present

## 2022-10-09 DIAGNOSIS — D689 Coagulation defect, unspecified: Secondary | ICD-10-CM | POA: Diagnosis not present

## 2022-10-09 DIAGNOSIS — D509 Iron deficiency anemia, unspecified: Secondary | ICD-10-CM | POA: Diagnosis not present

## 2022-10-09 DIAGNOSIS — Z992 Dependence on renal dialysis: Secondary | ICD-10-CM | POA: Diagnosis not present

## 2022-10-11 DIAGNOSIS — N2581 Secondary hyperparathyroidism of renal origin: Secondary | ICD-10-CM | POA: Diagnosis not present

## 2022-10-11 DIAGNOSIS — D689 Coagulation defect, unspecified: Secondary | ICD-10-CM | POA: Diagnosis not present

## 2022-10-11 DIAGNOSIS — E875 Hyperkalemia: Secondary | ICD-10-CM | POA: Diagnosis not present

## 2022-10-11 DIAGNOSIS — Z23 Encounter for immunization: Secondary | ICD-10-CM | POA: Diagnosis not present

## 2022-10-11 DIAGNOSIS — Z992 Dependence on renal dialysis: Secondary | ICD-10-CM | POA: Diagnosis not present

## 2022-10-11 DIAGNOSIS — D509 Iron deficiency anemia, unspecified: Secondary | ICD-10-CM | POA: Diagnosis not present

## 2022-10-11 DIAGNOSIS — N186 End stage renal disease: Secondary | ICD-10-CM | POA: Diagnosis not present

## 2022-10-13 DIAGNOSIS — N2581 Secondary hyperparathyroidism of renal origin: Secondary | ICD-10-CM | POA: Diagnosis not present

## 2022-10-13 DIAGNOSIS — D509 Iron deficiency anemia, unspecified: Secondary | ICD-10-CM | POA: Diagnosis not present

## 2022-10-13 DIAGNOSIS — N186 End stage renal disease: Secondary | ICD-10-CM | POA: Diagnosis not present

## 2022-10-13 DIAGNOSIS — Z992 Dependence on renal dialysis: Secondary | ICD-10-CM | POA: Diagnosis not present

## 2022-10-13 DIAGNOSIS — E875 Hyperkalemia: Secondary | ICD-10-CM | POA: Diagnosis not present

## 2022-10-13 DIAGNOSIS — D689 Coagulation defect, unspecified: Secondary | ICD-10-CM | POA: Diagnosis not present

## 2022-10-13 DIAGNOSIS — Z23 Encounter for immunization: Secondary | ICD-10-CM | POA: Diagnosis not present

## 2022-10-14 DIAGNOSIS — E8770 Fluid overload, unspecified: Secondary | ICD-10-CM | POA: Diagnosis not present

## 2022-10-14 DIAGNOSIS — N2581 Secondary hyperparathyroidism of renal origin: Secondary | ICD-10-CM | POA: Diagnosis not present

## 2022-10-14 DIAGNOSIS — Z992 Dependence on renal dialysis: Secondary | ICD-10-CM | POA: Diagnosis not present

## 2022-10-14 DIAGNOSIS — N186 End stage renal disease: Secondary | ICD-10-CM | POA: Diagnosis not present

## 2022-10-16 DIAGNOSIS — Z992 Dependence on renal dialysis: Secondary | ICD-10-CM | POA: Diagnosis not present

## 2022-10-16 DIAGNOSIS — D509 Iron deficiency anemia, unspecified: Secondary | ICD-10-CM | POA: Diagnosis not present

## 2022-10-16 DIAGNOSIS — Z23 Encounter for immunization: Secondary | ICD-10-CM | POA: Diagnosis not present

## 2022-10-16 DIAGNOSIS — N2581 Secondary hyperparathyroidism of renal origin: Secondary | ICD-10-CM | POA: Diagnosis not present

## 2022-10-16 DIAGNOSIS — N186 End stage renal disease: Secondary | ICD-10-CM | POA: Diagnosis not present

## 2022-10-16 DIAGNOSIS — D689 Coagulation defect, unspecified: Secondary | ICD-10-CM | POA: Diagnosis not present

## 2022-10-16 DIAGNOSIS — E875 Hyperkalemia: Secondary | ICD-10-CM | POA: Diagnosis not present

## 2022-10-18 ENCOUNTER — Telehealth (HOSPITAL_COMMUNITY): Payer: Self-pay

## 2022-10-18 DIAGNOSIS — D689 Coagulation defect, unspecified: Secondary | ICD-10-CM | POA: Diagnosis not present

## 2022-10-18 DIAGNOSIS — N2581 Secondary hyperparathyroidism of renal origin: Secondary | ICD-10-CM | POA: Diagnosis not present

## 2022-10-18 DIAGNOSIS — Z23 Encounter for immunization: Secondary | ICD-10-CM | POA: Diagnosis not present

## 2022-10-18 DIAGNOSIS — E875 Hyperkalemia: Secondary | ICD-10-CM | POA: Diagnosis not present

## 2022-10-18 DIAGNOSIS — D509 Iron deficiency anemia, unspecified: Secondary | ICD-10-CM | POA: Diagnosis not present

## 2022-10-18 DIAGNOSIS — Z992 Dependence on renal dialysis: Secondary | ICD-10-CM | POA: Diagnosis not present

## 2022-10-18 DIAGNOSIS — N186 End stage renal disease: Secondary | ICD-10-CM | POA: Diagnosis not present

## 2022-10-18 NOTE — Telephone Encounter (Signed)
Called to confirm/remind patient of their appointment at [facility] on 10/19/22.   Patient reminded to bring all medications and/or complete list.  Confirmed patient has transportation. Gave directions, instructed to utilize valet parking.  Confirmed appointment prior to ending call.

## 2022-10-19 ENCOUNTER — Encounter (HOSPITAL_COMMUNITY): Payer: Self-pay

## 2022-10-19 ENCOUNTER — Ambulatory Visit (HOSPITAL_COMMUNITY)
Admission: RE | Admit: 2022-10-19 | Discharge: 2022-10-19 | Disposition: A | Discharge status: Home / Self Care | Payer: 59 | Source: Ambulatory Visit | Attending: Cardiology | Admitting: Cardiology

## 2022-10-19 VITALS — BP 102/78 | HR 104 | Wt 207.0 lb

## 2022-10-19 DIAGNOSIS — I5032 Chronic diastolic (congestive) heart failure: Secondary | ICD-10-CM

## 2022-10-19 DIAGNOSIS — R001 Bradycardia, unspecified: Secondary | ICD-10-CM | POA: Insufficient documentation

## 2022-10-19 DIAGNOSIS — N184 Chronic kidney disease, stage 4 (severe): Secondary | ICD-10-CM | POA: Diagnosis not present

## 2022-10-19 DIAGNOSIS — J45909 Unspecified asthma, uncomplicated: Secondary | ICD-10-CM | POA: Insufficient documentation

## 2022-10-19 DIAGNOSIS — R188 Other ascites: Secondary | ICD-10-CM | POA: Diagnosis not present

## 2022-10-19 DIAGNOSIS — E785 Hyperlipidemia, unspecified: Secondary | ICD-10-CM | POA: Diagnosis not present

## 2022-10-19 DIAGNOSIS — Z8673 Personal history of transient ischemic attack (TIA), and cerebral infarction without residual deficits: Secondary | ICD-10-CM | POA: Diagnosis not present

## 2022-10-19 DIAGNOSIS — Z992 Dependence on renal dialysis: Secondary | ICD-10-CM | POA: Insufficient documentation

## 2022-10-19 DIAGNOSIS — Z952 Presence of prosthetic heart valve: Secondary | ICD-10-CM | POA: Diagnosis not present

## 2022-10-19 DIAGNOSIS — I5022 Chronic systolic (congestive) heart failure: Secondary | ICD-10-CM | POA: Insufficient documentation

## 2022-10-19 DIAGNOSIS — I130 Hypertensive heart and chronic kidney disease with heart failure and stage 1 through stage 4 chronic kidney disease, or unspecified chronic kidney disease: Secondary | ICD-10-CM | POA: Diagnosis present

## 2022-10-19 DIAGNOSIS — I132 Hypertensive heart and chronic kidney disease with heart failure and with stage 5 chronic kidney disease, or end stage renal disease: Secondary | ICD-10-CM | POA: Insufficient documentation

## 2022-10-19 DIAGNOSIS — N186 End stage renal disease: Secondary | ICD-10-CM | POA: Diagnosis not present

## 2022-10-19 NOTE — Progress Notes (Signed)
Heart Failure Clinic Note  ID:  Vernon, Blackburn 08-04-1965, MRN [id number] Provider location: [place] [facility] Type of Visit: Established patient   PCP:  Paulina Fusi, MD  Cardiologist:  Dr. Shirlee Latch   History of Present Illness: Vernon Blackburn is a 57 y.o. male who has a history of long-standing, poorly-controlled HTN, CKD stage 3-4, chronic systolic CHF and severe mitral regurgitation.  He presents for followup of CHF and HTN.  He has had hypertension for years.  He has had known CKD, followed by [facility].  He developed severe dyspnea in 12/17 and was admitted with acute systolic CHF.  Echo showed EF 20-25% with severe MR.  He was diuresed and discharged.   TEE was done in 5/18 to assess mitral valve.  There was severe MR.  The MV was thickened and did not coapt well.  Suspect there was a component of secondary MR with moderate LV dilation, however the thickened leaflets suggested a component of primary MR as well.   He had right and left heart cath in 6/18.  No significant CAD, elevated filling pressures.    In 6/18, he had mitral valve repair.  Post-op echo in 7/18 showed EF 25-30% with stable MV repair (mild MR, mean gradient 4 mmHg).  Echo 12/18 with EF 30-35%, moderate LV dilation, s/p MVR repair with trivial MR.  Echo in 4/19 showed EF up to 40-45% with stable repaired mitral valve.   He had an echo in 8/20 with EF 35%, mild LV dilation, s/p MV repair with mean gradient 4 mmHg and trivial MR, mildly decreased RV systolic function.   He was admitted in 2/21 to [facility] with worsening renal function and HD was started.  Echo at [facility] in 2/21 showed EF 40-45%, mild LV dilation, moderate LVH, "severe mitral stenosis" with mean gradient 7 mmHg and eccentric mild MR.  Head imagining showed an old CVA.   TEE was done to followup on ?severe mitral stenosis in 3/21.  This showed EF 45%, moderate LVH, global mild hypokinesis, mildly decreased RV systolic function; s/p MV  repair, mild MR with mean gradient 4 mmHg, no significant mitral stenosis.  CPX in 11/21 showed peak VO2 17.7 (51% predicted), RER 1.12, VE/VCO2 slope 29.  Moderate functional limitation due to mixed restrictive/obstructive lung disease.  Echo in 7/22 showed EF 50-55%, s/p MV repair with trivial MR and mean gradient 5 mmHg.   Patient was admitted in 7/22 with abdominal distention and found to have ascites, cause still uncertain (unless simply due to inadequate dialysis prescription).  No evidence for cirrhosis by CT. He had paracentesis. He additionally had TURP for enlarged prostate and has ended up with a suprapubic catheter.    RHC in 10/22 to assess RV function in the setting of ascites of uncertain etiology showed normal right and left heart filling pressures, normal PA pressure, and normal cardiac output.  He has been seen by GI and continues to require paracenteses.  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   Echo in 1/24 showed EF 50%, mild LVH, mild global hypokinesis, mildly decreased RV systolic function with mild RV enlargement, s/p mitral valve repair with mild MR.  Cardiolite in 1/24 showed no ischemia/infarction.   He has been listed for kidney transplant at [facility]. Just received call earlier this week that he has a live donor. Transplant planned for 11/08/22.   He returns today for  followup of CHF. Reports stable symptoms. Denies resting dyspnea, NYHA Class I-II. No chest pain. Volume has been well managed through iHD. His only issue has been continued low BP despite midodrine. He had to subsequently stop daily Coreg as of 09/06/22 and this has helped. His BP today in clinic is 102/78. EKG shows sinus tach 105 bpm.    ECG (personally reviewed): sinus tach 105 bpm   Labs (4/18): K 3.7, creatinine 3.05, hgb 10.4 Labs (5/18): K 3.3, creatinine 2.89, BNP 789 Labs (6/18): K 3.4, creatinine 2.4, hgb 10.1 Labs (7/18): K 3.2, creatinine  2.27, BNP 303 Labs (9/18): LDL 78, HDL 56, K 3.8, creatinine 8.29 Labs (10/18): hgb 10.1 Labs (2/19): K 3.3, creatinine 2.75, hgb 12.1 Labs (5/19): K 3.6, creatinine 3.07 Labs (7/20): K 4, creatinine 3.55, hgb 12.8 Labs (2/21): LDL 127 Labs (7/22): hgb 10.4 Labs (9/24): K 3.8, creatinine 7.31, hgb 12.6    PMH: 1. Gout 2. HTN: Long-standing, poor control. 3. ESRD.  Likely hypertensive nephropathy.  4. Dilated cardiomyopathy: May be due to long-standing HTN.  - Echo (1/18): EF 20-25%, severe MR - Echo (2/18): EF 25%, severe MR.  - TEE (5/18): Moderate LV dilation with severe global hypokinesis, EF 25-30%, normal RV size with mildly decreased systolic function, RV-RA gradient 55 mmHg, severe MR with mildly thickened leaflets with inadequate coaptation and ERO 0.52 cm^2 by PISA and systolic flow reversal in the pulmonary vein doppler pattern => secondary MR from dilated LV but thickened leaflets lead to concern for component of primary MR as well.  - RHC/LHC (6/18): No significant CAD.  Mean RA 15, PA 76/31 mean 48, mean PCWP 45, CI 3.02 Fick, 2.64 thermo.  - Echo (7/18): EF 25-30%, mild dilation, diffuse hypokinesis, s/p MV repair with mean gradient 4 mmHg and mild MR, PASP 37 mmHg, normal RV size and systolic function.  - Echo (12/18): EF 30-35%, moderate LV dilation with diffuse hypokinesis, stable MV repair.  - Echo (4/19): EF 40-45%, severe LV dilation, s/p mitral valve repair with mean gradient 4 mmHg, no mitral regurgitation, severe RV dilation with normal systolic function.  - Echo (8/20): EF 35%, mild LV dilation, s/p MV repair with mean gradient 4 mmHg and trivial MR, mildly decreased RV systolic function.  - Echo (2/21, [facility]):  EF 40-45%, mild LV dilation, moderate LVH, "severe mitral stenosis" with mean gradient 7 mmHg and eccentric mild MR. - TEE (3/21): EF 45%, moderate LVH, global mild hypokinesis, mildly decreased RV systolic function; s/p MV repair, mild MR with mean gradient 4  mmHg, no significant mitral stenosis.  - CPX (11/21): peak VO2 17.7 (51% predicted), RER 1.12, VE/VCO2 slope 29.  Moderate functional limitation due to mixed restrictive/obstructive lung disease.  - Echo (7/22): EF 50-55%, s/p MV repair with trivial MR and mean gradient 5 mmHg.  - RHC (10/22): mean RA 1, PA 17/2, mean PCWP 2, CI 3.3 - Echo (1/24): EF 50%, mild LVH, mild global hypokinesis, mildly decreased RV systolic function with mild RV enlargement, s/p mitral valve repair with mild MR.  - Cardiolite (1/24): EF 45%, no ischemia/infarction.  5. Severe MR: S/p MV repair in 6/18. 6. H/o CVA 7. BPH: S/p TURP, now with suprapubic catheter.  8. H/o ascites (7/22) with paracenteses.   SH: Married, lives in [place], nonsmoker, no drugs, occasional ETOH.  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   Family History  Problem Relation Age of Onset   Diabetes Mother    Hypertension Mother  Heart failure Mother    Colon cancer Neg Hx    Esophageal cancer Neg Hx    Rectal cancer Neg Hx    Stomach cancer Neg Hx    ROS: All systems reviewed and negative except as per HPI.   Current Outpatient Medications  Medication Sig Dispense Refill   Albuterol Sulfate (PROAIR RESPICLICK) 108 (90 Base) MCG/ACT AEPB Inhale 2 puffs into the lungs every 6 (six) hours as needed (wheezing/shortness of breath).     aspirin EC 81 MG tablet Take 81 mg by mouth daily. Swallow whole.     betamethasone 0.5% cream-vitamin a&d ointment 1:1 mixture Apply topically.     calcium acetate (PHOSLO) 667 MG capsule Take 2,668 mg by mouth 3 (three) times daily with meals.     lactulose (CHRONULAC) 10 GM/15ML solution Take 10 g by mouth 2 (two) times daily as needed for mild constipation.     midodrine (PROAMATINE) 10 MG tablet Take 1.5 tablets (15 mg total) by mouth every Monday, Wednesday, and Friday with hemodialysis. 60 tablet 3   polyethylene glycol (MIRALAX / GLYCOLAX) 17 g packet Take 17 g by mouth  every other day.     VYVANSE 30 MG capsule Take 30 mg by mouth every morning.     carvedilol (COREG) 3.125 MG tablet Take 1 tablet (3.125 mg total) by mouth 2 (two) times daily with a meal. (Patient not taking: Reported on 10/19/2022) 180 tablet 3   No current facility-administered medications for this encounter.   Exam:   BP 102/78   Pulse (!) 104   Wt 93.9 kg (207 lb)   SpO2 97%   BMI 30.57 kg/m   General:  Well appearing. No respiratory difficulty HEENT: normal Neck: supple. no JVD. Carotids 2+ bilat; no bruits. No lymphadenopathy or thyromegaly appreciated. Cor: PMI nondisplaced. Regular rate & rhythm. No rubs, gallops or murmurs. Lungs: clear Abdomen: soft, nontender, nondistended. No hepatosplenomegaly. No bruits or masses. Good bowel sounds. Extremities: no cyanosis, clubbing, rash, edema Neuro: alert & oriented x 3, cranial nerves grossly intact. moves all 4 extremities w/o difficulty. Affect pleasant.   Assessment/Plan: 1. HTN: Possible hypertensive cardiomyopathy and hypertensive nephropathy.  Controlled, 102/78 today in clinic. Off Coreg due to low BP at HD. Requires midodrine on HD days - no change  2. ESRD: He was switched from PD to HD.  He is on the transplant list at [facility].  - Just received call earlier this week that he has a live donor. Transplant planned for 11/08/22.  - From a cardiac standpoint, I see no contraindications. Discussed pt case w/ Dr. Shirlee Latch today. He also agrees  3. Chronic systolic CHF: Echo in 7/18 with EF 25-30%. Nonischemic cardiomyopathy based on cath in 6/18.  Cause of cardiomyopathy may be long-standing, poorly controlled HTN.  NYHA class II symptoms.  He improved symptomatically s/p MV repair.  Echo in 4/19 with EF 40-45%. Echo at [facility] in 2/21 showed EF 40-45%.   TEE in 3/21 with EF 45%.  Echo in 5/22 with EF up to 50-55%.  Echo in 1/24 showed EF 50%, mild LVH, mild global hypokinesis, mildly decreased RV systolic function with mild RV  enlargement, s/p mitral valve repair with mild MR. RHC in 10/22 was normal, his ascites cannot be explained by RV failure.  He is not volume overloaded on exam.  NYHA class I-II symptoms.  - GDMT limited by low BP and CKD. Has been off Coreg since 9/24 given low BPs at HD despite midodrine  support  - BP unlikely to tolerate further GDMT. Remains soft today low 100s systolic  - volume controlled through iHD  4. Mitral regurgitation: s/p MV repair for severe MR.  Much improved symptomatically s/p surgery. Echo at [facility] in 2/21 suggested "severe" mitral stenosis with mean gradient 7 mmHg and eccentric MR, ?mild.  However, TEE here in 3/21 showed stable MV repair with mild MR and no significant mitral stenosis.  Echo in 1/24 showed stable repaired mitral valve with no more than mild stenosis and mild regurgitation.  - Will need abx with dental work given prosthetic material.  5. Asthma: Occasional wheezing, not exertional.  Abnormal CPX in the past suggesting lung pathology. 6. CVA: Prior CVA by head imaging.   7. Hyperlipidemia: was on atorvastatin but pt reports PCP discontinued this 9/24 (pt stated felt could be diet controlled) - will need to reassess FLP in 2-3 months to reassess. If unable to diet control, would recommend restarting statin therapy  8. Ascites: No cirrhosis on liver imaging and RHC did not show RV failure.  He has required therapeutic paracenteses in the past. Volume well controlled through iHD   F/u w/ Dr. Shirlee Latch in 2 months   Signed, Robbie Lis, PA-C  10/19/2022  [facility] [address] [facility] [place] [address] [phone] (office) [phone] (fax)

## 2022-10-19 NOTE — Patient Instructions (Addendum)
EKG done today.   No Labs done today.   No medication changes were made. Please continue all current medications as prescribed.  Your physician recommends that you schedule a follow-up appointment in: 3 months. Please contact our office in November to schedule a January 2025 appointment.   If you have any questions or concerns before your next appointment please send [place] a message through [place] or call our office at [phone].    TO LEAVE A MESSAGE FOR THE NURSE SELECT OPTION 2, PLEASE LEAVE A MESSAGE INCLUDING: YOUR NAME DATE OF BIRTH CALL BACK NUMBER REASON FOR CALL**this is important as we prioritize the call backs  YOU WILL RECEIVE A CALL BACK THE SAME DAY AS LONG AS YOU CALL BEFORE 4:00 PM   Do the following things EVERYDAY: Weigh yourself in the morning before breakfast. Write it down and keep it in a log. Take your medicines as prescribed Eat low salt foods--Limit salt (sodium) to 2000 mg per day.  Stay as active as you can everyday Limit all fluids for the day to less than 2 liters   At the [facility], you and your health needs are our priority. As part of our continuing mission to provide you with exceptional heart care, we have created designated Provider Care Teams. These Care Teams include your primary Cardiologist (physician) and Advanced Practice Providers (APPs- Physician Assistants and Nurse Practitioners) who all work together to provide you with the care you need, when you need it.   You may see any of the following providers on your designated Care Team at your next follow up: Dr Arvilla Meres Dr Marca Ancona Dr. Marcos Eke, NP Robbie Lis, [place] [facility] [place], [place] Brynda Peon, NP Karle Plumber, PharmD   Please be sure to bring in all your medications bottles to every appointment.    Thank you for choosing [facility]

## 2022-10-20 DIAGNOSIS — Z9884 Bariatric surgery status: Secondary | ICD-10-CM | POA: Diagnosis not present

## 2022-10-20 DIAGNOSIS — D689 Coagulation defect, unspecified: Secondary | ICD-10-CM | POA: Diagnosis not present

## 2022-10-20 DIAGNOSIS — I10 Essential (primary) hypertension: Secondary | ICD-10-CM | POA: Diagnosis not present

## 2022-10-20 DIAGNOSIS — Z01818 Encounter for other preprocedural examination: Secondary | ICD-10-CM | POA: Diagnosis not present

## 2022-10-20 DIAGNOSIS — N2581 Secondary hyperparathyroidism of renal origin: Secondary | ICD-10-CM | POA: Diagnosis not present

## 2022-10-20 DIAGNOSIS — E875 Hyperkalemia: Secondary | ICD-10-CM | POA: Diagnosis not present

## 2022-10-20 DIAGNOSIS — D509 Iron deficiency anemia, unspecified: Secondary | ICD-10-CM | POA: Diagnosis not present

## 2022-10-20 DIAGNOSIS — N186 End stage renal disease: Secondary | ICD-10-CM | POA: Diagnosis not present

## 2022-10-20 DIAGNOSIS — Z9889 Other specified postprocedural states: Secondary | ICD-10-CM | POA: Diagnosis not present

## 2022-10-20 DIAGNOSIS — Z992 Dependence on renal dialysis: Secondary | ICD-10-CM | POA: Diagnosis not present

## 2022-10-20 DIAGNOSIS — Z23 Encounter for immunization: Secondary | ICD-10-CM | POA: Diagnosis not present

## 2022-10-23 DIAGNOSIS — D509 Iron deficiency anemia, unspecified: Secondary | ICD-10-CM | POA: Diagnosis not present

## 2022-10-23 DIAGNOSIS — E875 Hyperkalemia: Secondary | ICD-10-CM | POA: Diagnosis not present

## 2022-10-23 DIAGNOSIS — Z992 Dependence on renal dialysis: Secondary | ICD-10-CM | POA: Diagnosis not present

## 2022-10-23 DIAGNOSIS — N186 End stage renal disease: Secondary | ICD-10-CM | POA: Diagnosis not present

## 2022-10-23 DIAGNOSIS — Z23 Encounter for immunization: Secondary | ICD-10-CM | POA: Diagnosis not present

## 2022-10-23 DIAGNOSIS — D689 Coagulation defect, unspecified: Secondary | ICD-10-CM | POA: Diagnosis not present

## 2022-10-23 DIAGNOSIS — N2581 Secondary hyperparathyroidism of renal origin: Secondary | ICD-10-CM | POA: Diagnosis not present

## 2022-10-24 DIAGNOSIS — M109 Gout, unspecified: Secondary | ICD-10-CM | POA: Diagnosis not present

## 2022-10-24 DIAGNOSIS — Z992 Dependence on renal dialysis: Secondary | ICD-10-CM | POA: Diagnosis not present

## 2022-10-24 DIAGNOSIS — I132 Hypertensive heart and chronic kidney disease with heart failure and with stage 5 chronic kidney disease, or end stage renal disease: Secondary | ICD-10-CM | POA: Diagnosis not present

## 2022-10-24 DIAGNOSIS — D84821 Immunodeficiency due to drugs: Secondary | ICD-10-CM | POA: Diagnosis not present

## 2022-10-24 DIAGNOSIS — I953 Hypotension of hemodialysis: Secondary | ICD-10-CM | POA: Diagnosis not present

## 2022-10-24 DIAGNOSIS — K760 Fatty (change of) liver, not elsewhere classified: Secondary | ICD-10-CM | POA: Diagnosis not present

## 2022-10-24 DIAGNOSIS — Z9889 Other specified postprocedural states: Secondary | ICD-10-CM | POA: Diagnosis not present

## 2022-10-24 DIAGNOSIS — N186 End stage renal disease: Secondary | ICD-10-CM | POA: Diagnosis not present

## 2022-10-24 DIAGNOSIS — I120 Hypertensive chronic kidney disease with stage 5 chronic kidney disease or end stage renal disease: Secondary | ICD-10-CM | POA: Diagnosis not present

## 2022-10-24 DIAGNOSIS — Z01818 Encounter for other preprocedural examination: Secondary | ICD-10-CM | POA: Diagnosis not present

## 2022-10-24 DIAGNOSIS — I429 Cardiomyopathy, unspecified: Secondary | ICD-10-CM | POA: Diagnosis not present

## 2022-10-24 DIAGNOSIS — Z7960 Long term (current) use of unspecified immunomodulators and immunosuppressants: Secondary | ICD-10-CM | POA: Diagnosis not present

## 2022-10-24 DIAGNOSIS — E1122 Type 2 diabetes mellitus with diabetic chronic kidney disease: Secondary | ICD-10-CM | POA: Diagnosis not present

## 2022-10-24 DIAGNOSIS — N368 Other specified disorders of urethra: Secondary | ICD-10-CM | POA: Diagnosis not present

## 2022-10-24 DIAGNOSIS — Z9884 Bariatric surgery status: Secondary | ICD-10-CM | POA: Diagnosis not present

## 2022-10-25 DIAGNOSIS — Z23 Encounter for immunization: Secondary | ICD-10-CM | POA: Diagnosis not present

## 2022-10-25 DIAGNOSIS — Z7682 Awaiting organ transplant status: Secondary | ICD-10-CM | POA: Diagnosis not present

## 2022-10-25 DIAGNOSIS — Z992 Dependence on renal dialysis: Secondary | ICD-10-CM | POA: Diagnosis not present

## 2022-10-25 DIAGNOSIS — N186 End stage renal disease: Secondary | ICD-10-CM | POA: Diagnosis not present

## 2022-10-25 DIAGNOSIS — E875 Hyperkalemia: Secondary | ICD-10-CM | POA: Diagnosis not present

## 2022-10-25 DIAGNOSIS — D509 Iron deficiency anemia, unspecified: Secondary | ICD-10-CM | POA: Diagnosis not present

## 2022-10-25 DIAGNOSIS — D689 Coagulation defect, unspecified: Secondary | ICD-10-CM | POA: Diagnosis not present

## 2022-10-25 DIAGNOSIS — N2581 Secondary hyperparathyroidism of renal origin: Secondary | ICD-10-CM | POA: Diagnosis not present

## 2022-10-26 DIAGNOSIS — K644 Residual hemorrhoidal skin tags: Secondary | ICD-10-CM | POA: Diagnosis not present

## 2022-10-26 DIAGNOSIS — J45909 Unspecified asthma, uncomplicated: Secondary | ICD-10-CM | POA: Diagnosis not present

## 2022-10-26 DIAGNOSIS — N186 End stage renal disease: Secondary | ICD-10-CM | POA: Diagnosis not present

## 2022-10-26 DIAGNOSIS — I959 Hypotension, unspecified: Secondary | ICD-10-CM | POA: Diagnosis not present

## 2022-10-26 DIAGNOSIS — M109 Gout, unspecified: Secondary | ICD-10-CM | POA: Diagnosis not present

## 2022-10-26 DIAGNOSIS — I130 Hypertensive heart and chronic kidney disease with heart failure and stage 1 through stage 4 chronic kidney disease, or unspecified chronic kidney disease: Secondary | ICD-10-CM | POA: Diagnosis not present

## 2022-10-26 DIAGNOSIS — E785 Hyperlipidemia, unspecified: Secondary | ICD-10-CM | POA: Diagnosis not present

## 2022-10-26 DIAGNOSIS — K5909 Other constipation: Secondary | ICD-10-CM | POA: Diagnosis not present

## 2022-10-26 DIAGNOSIS — Z992 Dependence on renal dialysis: Secondary | ICD-10-CM | POA: Diagnosis not present

## 2022-10-27 DIAGNOSIS — N186 End stage renal disease: Secondary | ICD-10-CM | POA: Diagnosis not present

## 2022-10-27 DIAGNOSIS — Z23 Encounter for immunization: Secondary | ICD-10-CM | POA: Diagnosis not present

## 2022-10-27 DIAGNOSIS — D509 Iron deficiency anemia, unspecified: Secondary | ICD-10-CM | POA: Diagnosis not present

## 2022-10-27 DIAGNOSIS — E875 Hyperkalemia: Secondary | ICD-10-CM | POA: Diagnosis not present

## 2022-10-27 DIAGNOSIS — N2581 Secondary hyperparathyroidism of renal origin: Secondary | ICD-10-CM | POA: Diagnosis not present

## 2022-10-27 DIAGNOSIS — D689 Coagulation defect, unspecified: Secondary | ICD-10-CM | POA: Diagnosis not present

## 2022-10-27 DIAGNOSIS — Z992 Dependence on renal dialysis: Secondary | ICD-10-CM | POA: Diagnosis not present

## 2022-10-30 DIAGNOSIS — D509 Iron deficiency anemia, unspecified: Secondary | ICD-10-CM | POA: Diagnosis not present

## 2022-10-30 DIAGNOSIS — E875 Hyperkalemia: Secondary | ICD-10-CM | POA: Diagnosis not present

## 2022-10-30 DIAGNOSIS — N186 End stage renal disease: Secondary | ICD-10-CM | POA: Diagnosis not present

## 2022-10-30 DIAGNOSIS — D689 Coagulation defect, unspecified: Secondary | ICD-10-CM | POA: Diagnosis not present

## 2022-10-30 DIAGNOSIS — Z23 Encounter for immunization: Secondary | ICD-10-CM | POA: Diagnosis not present

## 2022-10-30 DIAGNOSIS — N2581 Secondary hyperparathyroidism of renal origin: Secondary | ICD-10-CM | POA: Diagnosis not present

## 2022-10-30 DIAGNOSIS — Z992 Dependence on renal dialysis: Secondary | ICD-10-CM | POA: Diagnosis not present

## 2022-11-01 DIAGNOSIS — D689 Coagulation defect, unspecified: Secondary | ICD-10-CM | POA: Diagnosis not present

## 2022-11-01 DIAGNOSIS — Z23 Encounter for immunization: Secondary | ICD-10-CM | POA: Diagnosis not present

## 2022-11-01 DIAGNOSIS — E875 Hyperkalemia: Secondary | ICD-10-CM | POA: Diagnosis not present

## 2022-11-01 DIAGNOSIS — Z992 Dependence on renal dialysis: Secondary | ICD-10-CM | POA: Diagnosis not present

## 2022-11-01 DIAGNOSIS — N2581 Secondary hyperparathyroidism of renal origin: Secondary | ICD-10-CM | POA: Diagnosis not present

## 2022-11-01 DIAGNOSIS — N186 End stage renal disease: Secondary | ICD-10-CM | POA: Diagnosis not present

## 2022-11-01 DIAGNOSIS — D509 Iron deficiency anemia, unspecified: Secondary | ICD-10-CM | POA: Diagnosis not present

## 2022-11-02 DIAGNOSIS — Z992 Dependence on renal dialysis: Secondary | ICD-10-CM | POA: Diagnosis not present

## 2022-11-02 DIAGNOSIS — I129 Hypertensive chronic kidney disease with stage 1 through stage 4 chronic kidney disease, or unspecified chronic kidney disease: Secondary | ICD-10-CM | POA: Diagnosis not present

## 2022-11-02 DIAGNOSIS — N186 End stage renal disease: Secondary | ICD-10-CM | POA: Diagnosis not present

## 2022-11-02 DIAGNOSIS — N35011 Post-traumatic bulbous urethral stricture: Secondary | ICD-10-CM | POA: Diagnosis not present

## 2022-11-03 DIAGNOSIS — N2581 Secondary hyperparathyroidism of renal origin: Secondary | ICD-10-CM | POA: Diagnosis not present

## 2022-11-03 DIAGNOSIS — N186 End stage renal disease: Secondary | ICD-10-CM | POA: Diagnosis not present

## 2022-11-03 DIAGNOSIS — Z992 Dependence on renal dialysis: Secondary | ICD-10-CM | POA: Diagnosis not present

## 2022-11-03 DIAGNOSIS — D689 Coagulation defect, unspecified: Secondary | ICD-10-CM | POA: Diagnosis not present

## 2022-11-06 DIAGNOSIS — D689 Coagulation defect, unspecified: Secondary | ICD-10-CM | POA: Diagnosis not present

## 2022-11-06 DIAGNOSIS — Z992 Dependence on renal dialysis: Secondary | ICD-10-CM | POA: Diagnosis not present

## 2022-11-06 DIAGNOSIS — N186 End stage renal disease: Secondary | ICD-10-CM | POA: Diagnosis not present

## 2022-11-06 DIAGNOSIS — N2581 Secondary hyperparathyroidism of renal origin: Secondary | ICD-10-CM | POA: Diagnosis not present

## 2022-11-07 DIAGNOSIS — Z01818 Encounter for other preprocedural examination: Secondary | ICD-10-CM | POA: Diagnosis not present

## 2022-11-07 DIAGNOSIS — I9589 Other hypotension: Secondary | ICD-10-CM | POA: Diagnosis not present

## 2022-11-07 DIAGNOSIS — D84821 Immunodeficiency due to drugs: Secondary | ICD-10-CM | POA: Diagnosis not present

## 2022-11-07 DIAGNOSIS — Z8673 Personal history of transient ischemic attack (TIA), and cerebral infarction without residual deficits: Secondary | ICD-10-CM | POA: Diagnosis not present

## 2022-11-07 DIAGNOSIS — Z466 Encounter for fitting and adjustment of urinary device: Secondary | ICD-10-CM | POA: Diagnosis not present

## 2022-11-07 DIAGNOSIS — I120 Hypertensive chronic kidney disease with stage 5 chronic kidney disease or end stage renal disease: Secondary | ICD-10-CM | POA: Diagnosis not present

## 2022-11-07 DIAGNOSIS — E162 Hypoglycemia, unspecified: Secondary | ICD-10-CM | POA: Diagnosis not present

## 2022-11-07 DIAGNOSIS — Z7682 Awaiting organ transplant status: Secondary | ICD-10-CM | POA: Diagnosis not present

## 2022-11-07 DIAGNOSIS — I10 Essential (primary) hypertension: Secondary | ICD-10-CM | POA: Diagnosis not present

## 2022-11-07 DIAGNOSIS — Z992 Dependence on renal dialysis: Secondary | ICD-10-CM | POA: Diagnosis not present

## 2022-11-07 DIAGNOSIS — Z79899 Other long term (current) drug therapy: Secondary | ICD-10-CM | POA: Diagnosis not present

## 2022-11-07 DIAGNOSIS — I9581 Postprocedural hypotension: Secondary | ICD-10-CM | POA: Diagnosis not present

## 2022-11-07 DIAGNOSIS — T8619 Other complication of kidney transplant: Secondary | ICD-10-CM | POA: Diagnosis not present

## 2022-11-07 DIAGNOSIS — J9811 Atelectasis: Secondary | ICD-10-CM | POA: Diagnosis not present

## 2022-11-07 DIAGNOSIS — D849 Immunodeficiency, unspecified: Secondary | ICD-10-CM | POA: Diagnosis not present

## 2022-11-07 DIAGNOSIS — I959 Hypotension, unspecified: Secondary | ICD-10-CM | POA: Diagnosis not present

## 2022-11-07 DIAGNOSIS — N2889 Other specified disorders of kidney and ureter: Secondary | ICD-10-CM | POA: Diagnosis not present

## 2022-11-07 DIAGNOSIS — H905 Unspecified sensorineural hearing loss: Secondary | ICD-10-CM | POA: Diagnosis not present

## 2022-11-07 DIAGNOSIS — Z940 Kidney transplant status: Secondary | ICD-10-CM | POA: Diagnosis not present

## 2022-11-07 DIAGNOSIS — Z792 Long term (current) use of antibiotics: Secondary | ICD-10-CM | POA: Diagnosis not present

## 2022-11-07 DIAGNOSIS — G8918 Other acute postprocedural pain: Secondary | ICD-10-CM | POA: Diagnosis not present

## 2022-11-07 DIAGNOSIS — N186 End stage renal disease: Secondary | ICD-10-CM | POA: Diagnosis not present

## 2022-11-07 DIAGNOSIS — N3289 Other specified disorders of bladder: Secondary | ICD-10-CM | POA: Diagnosis not present

## 2022-11-07 DIAGNOSIS — N200 Calculus of kidney: Secondary | ICD-10-CM | POA: Diagnosis not present

## 2022-11-07 DIAGNOSIS — J449 Chronic obstructive pulmonary disease, unspecified: Secondary | ICD-10-CM | POA: Diagnosis not present

## 2022-11-07 DIAGNOSIS — Z952 Presence of prosthetic heart valve: Secondary | ICD-10-CM | POA: Diagnosis not present

## 2022-11-07 DIAGNOSIS — D631 Anemia in chronic kidney disease: Secondary | ICD-10-CM | POA: Diagnosis not present

## 2022-11-07 DIAGNOSIS — I509 Heart failure, unspecified: Secondary | ICD-10-CM | POA: Diagnosis not present

## 2022-11-07 DIAGNOSIS — Z9884 Bariatric surgery status: Secondary | ICD-10-CM | POA: Diagnosis not present

## 2022-11-07 DIAGNOSIS — Z960 Presence of urogenital implants: Secondary | ICD-10-CM | POA: Diagnosis not present

## 2022-11-07 DIAGNOSIS — I132 Hypertensive heart and chronic kidney disease with heart failure and with stage 5 chronic kidney disease, or end stage renal disease: Secondary | ICD-10-CM | POA: Diagnosis not present

## 2022-11-07 DIAGNOSIS — I953 Hypotension of hemodialysis: Secondary | ICD-10-CM | POA: Diagnosis not present

## 2022-11-07 DIAGNOSIS — R195 Other fecal abnormalities: Secondary | ICD-10-CM | POA: Diagnosis not present

## 2022-11-07 DIAGNOSIS — K921 Melena: Secondary | ICD-10-CM | POA: Diagnosis not present

## 2022-11-22 DIAGNOSIS — T8619 Other complication of kidney transplant: Secondary | ICD-10-CM | POA: Diagnosis not present

## 2022-11-22 DIAGNOSIS — Z7952 Long term (current) use of systemic steroids: Secondary | ICD-10-CM | POA: Diagnosis not present

## 2022-11-22 DIAGNOSIS — Z8673 Personal history of transient ischemic attack (TIA), and cerebral infarction without residual deficits: Secondary | ICD-10-CM | POA: Diagnosis not present

## 2022-11-22 DIAGNOSIS — R93422 Abnormal radiologic findings on diagnostic imaging of left kidney: Secondary | ICD-10-CM | POA: Diagnosis not present

## 2022-11-22 DIAGNOSIS — Z9884 Bariatric surgery status: Secondary | ICD-10-CM | POA: Diagnosis not present

## 2022-11-22 DIAGNOSIS — Z949 Transplanted organ and tissue status, unspecified: Secondary | ICD-10-CM | POA: Diagnosis not present

## 2022-11-22 DIAGNOSIS — I953 Hypotension of hemodialysis: Secondary | ICD-10-CM | POA: Diagnosis not present

## 2022-11-22 DIAGNOSIS — N2889 Other specified disorders of kidney and ureter: Secondary | ICD-10-CM | POA: Diagnosis not present

## 2022-11-22 DIAGNOSIS — I110 Hypertensive heart disease with heart failure: Secondary | ICD-10-CM | POA: Diagnosis not present

## 2022-11-22 DIAGNOSIS — T83122A Displacement of urinary stent, initial encounter: Secondary | ICD-10-CM | POA: Diagnosis not present

## 2022-11-22 DIAGNOSIS — R1032 Left lower quadrant pain: Secondary | ICD-10-CM | POA: Diagnosis not present

## 2022-11-22 DIAGNOSIS — I10 Essential (primary) hypertension: Secondary | ICD-10-CM | POA: Diagnosis not present

## 2022-11-22 DIAGNOSIS — J449 Chronic obstructive pulmonary disease, unspecified: Secondary | ICD-10-CM | POA: Diagnosis not present

## 2022-11-22 DIAGNOSIS — Z960 Presence of urogenital implants: Secondary | ICD-10-CM | POA: Diagnosis not present

## 2022-11-22 DIAGNOSIS — Z79899 Other long term (current) drug therapy: Secondary | ICD-10-CM | POA: Diagnosis not present

## 2022-11-22 DIAGNOSIS — D849 Immunodeficiency, unspecified: Secondary | ICD-10-CM | POA: Diagnosis not present

## 2022-11-22 DIAGNOSIS — D84821 Immunodeficiency due to drugs: Secondary | ICD-10-CM | POA: Diagnosis not present

## 2022-11-22 DIAGNOSIS — I509 Heart failure, unspecified: Secondary | ICD-10-CM | POA: Diagnosis not present

## 2022-11-22 DIAGNOSIS — Z79624 Long term (current) use of inhibitors of nucleotide synthesis: Secondary | ICD-10-CM | POA: Diagnosis not present

## 2022-11-22 DIAGNOSIS — Z792 Long term (current) use of antibiotics: Secondary | ICD-10-CM | POA: Diagnosis not present

## 2022-11-22 DIAGNOSIS — Z940 Kidney transplant status: Secondary | ICD-10-CM | POA: Diagnosis not present

## 2022-11-22 DIAGNOSIS — T85698A Other mechanical complication of other specified internal prosthetic devices, implants and grafts, initial encounter: Secondary | ICD-10-CM | POA: Diagnosis not present

## 2022-11-22 DIAGNOSIS — Z7982 Long term (current) use of aspirin: Secondary | ICD-10-CM | POA: Diagnosis not present

## 2022-11-22 DIAGNOSIS — N1330 Unspecified hydronephrosis: Secondary | ICD-10-CM | POA: Diagnosis not present

## 2022-11-25 DIAGNOSIS — T85698A Other mechanical complication of other specified internal prosthetic devices, implants and grafts, initial encounter: Secondary | ICD-10-CM | POA: Diagnosis not present

## 2022-11-25 DIAGNOSIS — Z792 Long term (current) use of antibiotics: Secondary | ICD-10-CM | POA: Diagnosis not present

## 2022-11-25 DIAGNOSIS — I10 Essential (primary) hypertension: Secondary | ICD-10-CM | POA: Diagnosis not present

## 2022-11-25 DIAGNOSIS — T8619 Other complication of kidney transplant: Secondary | ICD-10-CM | POA: Diagnosis not present

## 2022-11-25 DIAGNOSIS — D849 Immunodeficiency, unspecified: Secondary | ICD-10-CM | POA: Diagnosis not present

## 2022-11-25 DIAGNOSIS — R1032 Left lower quadrant pain: Secondary | ICD-10-CM | POA: Diagnosis not present

## 2022-11-25 DIAGNOSIS — N1330 Unspecified hydronephrosis: Secondary | ICD-10-CM | POA: Diagnosis not present

## 2022-11-25 DIAGNOSIS — Z949 Transplanted organ and tissue status, unspecified: Secondary | ICD-10-CM | POA: Diagnosis not present

## 2022-11-25 DIAGNOSIS — Z940 Kidney transplant status: Secondary | ICD-10-CM | POA: Diagnosis not present

## 2022-11-25 DIAGNOSIS — N2889 Other specified disorders of kidney and ureter: Secondary | ICD-10-CM | POA: Diagnosis not present

## 2022-11-28 DIAGNOSIS — N1330 Unspecified hydronephrosis: Secondary | ICD-10-CM | POA: Diagnosis not present

## 2022-11-28 DIAGNOSIS — R93422 Abnormal radiologic findings on diagnostic imaging of left kidney: Secondary | ICD-10-CM | POA: Diagnosis not present

## 2022-11-28 DIAGNOSIS — T85698A Other mechanical complication of other specified internal prosthetic devices, implants and grafts, initial encounter: Secondary | ICD-10-CM | POA: Diagnosis not present

## 2022-11-28 DIAGNOSIS — Z940 Kidney transplant status: Secondary | ICD-10-CM | POA: Diagnosis not present

## 2022-12-03 DIAGNOSIS — Z7982 Long term (current) use of aspirin: Secondary | ICD-10-CM | POA: Diagnosis not present

## 2022-12-03 DIAGNOSIS — Z952 Presence of prosthetic heart valve: Secondary | ICD-10-CM | POA: Diagnosis not present

## 2022-12-03 DIAGNOSIS — I110 Hypertensive heart disease with heart failure: Secondary | ICD-10-CM | POA: Diagnosis not present

## 2022-12-03 DIAGNOSIS — J449 Chronic obstructive pulmonary disease, unspecified: Secondary | ICD-10-CM | POA: Diagnosis not present

## 2022-12-03 DIAGNOSIS — N131 Hydronephrosis with ureteral stricture, not elsewhere classified: Secondary | ICD-10-CM | POA: Diagnosis not present

## 2022-12-03 DIAGNOSIS — Z9884 Bariatric surgery status: Secondary | ICD-10-CM | POA: Diagnosis not present

## 2022-12-03 DIAGNOSIS — H905 Unspecified sensorineural hearing loss: Secondary | ICD-10-CM | POA: Diagnosis not present

## 2022-12-03 DIAGNOSIS — T8619 Other complication of kidney transplant: Secondary | ICD-10-CM | POA: Diagnosis not present

## 2022-12-03 DIAGNOSIS — D84821 Immunodeficiency due to drugs: Secondary | ICD-10-CM | POA: Diagnosis not present

## 2022-12-03 DIAGNOSIS — Z888 Allergy status to other drugs, medicaments and biological substances status: Secondary | ICD-10-CM | POA: Diagnosis not present

## 2022-12-03 DIAGNOSIS — R339 Retention of urine, unspecified: Secondary | ICD-10-CM | POA: Diagnosis not present

## 2022-12-03 DIAGNOSIS — N135 Crossing vessel and stricture of ureter without hydronephrosis: Secondary | ICD-10-CM | POA: Diagnosis not present

## 2022-12-03 DIAGNOSIS — Z7960 Long term (current) use of unspecified immunomodulators and immunosuppressants: Secondary | ICD-10-CM | POA: Diagnosis not present

## 2022-12-03 DIAGNOSIS — Z79899 Other long term (current) drug therapy: Secondary | ICD-10-CM | POA: Diagnosis not present

## 2022-12-03 DIAGNOSIS — Z91048 Other nonmedicinal substance allergy status: Secondary | ICD-10-CM | POA: Diagnosis not present

## 2022-12-03 DIAGNOSIS — Z940 Kidney transplant status: Secondary | ICD-10-CM | POA: Diagnosis not present

## 2022-12-03 DIAGNOSIS — N1330 Unspecified hydronephrosis: Secondary | ICD-10-CM | POA: Diagnosis not present

## 2022-12-03 DIAGNOSIS — I509 Heart failure, unspecified: Secondary | ICD-10-CM | POA: Diagnosis not present

## 2022-12-03 DIAGNOSIS — N179 Acute kidney failure, unspecified: Secondary | ICD-10-CM | POA: Diagnosis not present

## 2022-12-03 DIAGNOSIS — R1032 Left lower quadrant pain: Secondary | ICD-10-CM | POA: Diagnosis not present

## 2022-12-03 DIAGNOSIS — I429 Cardiomyopathy, unspecified: Secondary | ICD-10-CM | POA: Diagnosis not present

## 2022-12-03 DIAGNOSIS — R188 Other ascites: Secondary | ICD-10-CM | POA: Diagnosis not present

## 2022-12-03 DIAGNOSIS — N368 Other specified disorders of urethra: Secondary | ICD-10-CM | POA: Diagnosis not present

## 2022-12-03 DIAGNOSIS — Z8673 Personal history of transient ischemic attack (TIA), and cerebral infarction without residual deficits: Secondary | ICD-10-CM | POA: Diagnosis not present

## 2022-12-04 DIAGNOSIS — T8619 Other complication of kidney transplant: Secondary | ICD-10-CM | POA: Diagnosis not present

## 2022-12-04 DIAGNOSIS — Z940 Kidney transplant status: Secondary | ICD-10-CM | POA: Diagnosis not present

## 2022-12-04 DIAGNOSIS — N1330 Unspecified hydronephrosis: Secondary | ICD-10-CM | POA: Diagnosis not present

## 2022-12-05 DIAGNOSIS — R339 Retention of urine, unspecified: Secondary | ICD-10-CM | POA: Diagnosis not present

## 2022-12-05 DIAGNOSIS — Z940 Kidney transplant status: Secondary | ICD-10-CM | POA: Diagnosis not present

## 2022-12-05 DIAGNOSIS — N1330 Unspecified hydronephrosis: Secondary | ICD-10-CM | POA: Diagnosis not present

## 2022-12-05 DIAGNOSIS — T8619 Other complication of kidney transplant: Secondary | ICD-10-CM | POA: Diagnosis not present

## 2022-12-06 DIAGNOSIS — R339 Retention of urine, unspecified: Secondary | ICD-10-CM | POA: Diagnosis not present

## 2022-12-13 DIAGNOSIS — Z960 Presence of urogenital implants: Secondary | ICD-10-CM | POA: Diagnosis not present

## 2022-12-13 DIAGNOSIS — D849 Immunodeficiency, unspecified: Secondary | ICD-10-CM | POA: Diagnosis not present

## 2022-12-13 DIAGNOSIS — Z792 Long term (current) use of antibiotics: Secondary | ICD-10-CM | POA: Diagnosis not present

## 2022-12-13 DIAGNOSIS — T8619 Other complication of kidney transplant: Secondary | ICD-10-CM | POA: Diagnosis not present

## 2022-12-13 DIAGNOSIS — Z940 Kidney transplant status: Secondary | ICD-10-CM | POA: Diagnosis not present

## 2022-12-13 DIAGNOSIS — N1330 Unspecified hydronephrosis: Secondary | ICD-10-CM | POA: Diagnosis not present

## 2022-12-13 DIAGNOSIS — Z1159 Encounter for screening for other viral diseases: Secondary | ICD-10-CM | POA: Diagnosis not present

## 2022-12-13 DIAGNOSIS — T86898 Other complications of other transplanted tissue: Secondary | ICD-10-CM | POA: Diagnosis not present

## 2022-12-13 DIAGNOSIS — N3289 Other specified disorders of bladder: Secondary | ICD-10-CM | POA: Diagnosis not present

## 2022-12-16 DIAGNOSIS — Z8673 Personal history of transient ischemic attack (TIA), and cerebral infarction without residual deficits: Secondary | ICD-10-CM | POA: Diagnosis not present

## 2022-12-16 DIAGNOSIS — I509 Heart failure, unspecified: Secondary | ICD-10-CM | POA: Diagnosis not present

## 2022-12-16 DIAGNOSIS — R600 Localized edema: Secondary | ICD-10-CM | POA: Diagnosis not present

## 2022-12-16 DIAGNOSIS — Z7952 Long term (current) use of systemic steroids: Secondary | ICD-10-CM | POA: Diagnosis not present

## 2022-12-16 DIAGNOSIS — Z20822 Contact with and (suspected) exposure to covid-19: Secondary | ICD-10-CM | POA: Diagnosis not present

## 2022-12-16 DIAGNOSIS — I132 Hypertensive heart and chronic kidney disease with heart failure and with stage 5 chronic kidney disease, or end stage renal disease: Secondary | ICD-10-CM | POA: Diagnosis not present

## 2022-12-16 DIAGNOSIS — N186 End stage renal disease: Secondary | ICD-10-CM | POA: Diagnosis not present

## 2022-12-16 DIAGNOSIS — R34 Anuria and oliguria: Secondary | ICD-10-CM | POA: Diagnosis not present

## 2022-12-16 DIAGNOSIS — T8619 Other complication of kidney transplant: Secondary | ICD-10-CM | POA: Diagnosis not present

## 2022-12-16 DIAGNOSIS — R232 Flushing: Secondary | ICD-10-CM | POA: Diagnosis not present

## 2022-12-16 DIAGNOSIS — T86898 Other complications of other transplanted tissue: Secondary | ICD-10-CM | POA: Diagnosis not present

## 2022-12-16 DIAGNOSIS — D849 Immunodeficiency, unspecified: Secondary | ICD-10-CM | POA: Diagnosis not present

## 2022-12-16 DIAGNOSIS — Z9884 Bariatric surgery status: Secondary | ICD-10-CM | POA: Diagnosis not present

## 2022-12-16 DIAGNOSIS — R1084 Generalized abdominal pain: Secondary | ICD-10-CM | POA: Diagnosis not present

## 2022-12-16 DIAGNOSIS — Z79624 Long term (current) use of inhibitors of nucleotide synthesis: Secondary | ICD-10-CM | POA: Diagnosis not present

## 2022-12-16 DIAGNOSIS — Z940 Kidney transplant status: Secondary | ICD-10-CM | POA: Diagnosis not present

## 2022-12-20 DIAGNOSIS — Z960 Presence of urogenital implants: Secondary | ICD-10-CM | POA: Diagnosis not present

## 2022-12-20 DIAGNOSIS — D84821 Immunodeficiency due to drugs: Secondary | ICD-10-CM | POA: Diagnosis not present

## 2022-12-20 DIAGNOSIS — I10 Essential (primary) hypertension: Secondary | ICD-10-CM | POA: Diagnosis not present

## 2022-12-20 DIAGNOSIS — T86898 Other complications of other transplanted tissue: Secondary | ICD-10-CM | POA: Diagnosis not present

## 2022-12-20 DIAGNOSIS — T8619 Other complication of kidney transplant: Secondary | ICD-10-CM | POA: Diagnosis not present

## 2022-12-20 DIAGNOSIS — D849 Immunodeficiency, unspecified: Secondary | ICD-10-CM | POA: Diagnosis not present

## 2022-12-20 DIAGNOSIS — Z792 Long term (current) use of antibiotics: Secondary | ICD-10-CM | POA: Diagnosis not present

## 2022-12-20 DIAGNOSIS — Z940 Kidney transplant status: Secondary | ICD-10-CM | POA: Diagnosis not present

## 2022-12-20 DIAGNOSIS — N368 Other specified disorders of urethra: Secondary | ICD-10-CM | POA: Diagnosis not present

## 2022-12-20 DIAGNOSIS — D8489 Other immunodeficiencies: Secondary | ICD-10-CM | POA: Diagnosis not present

## 2023-01-04 DIAGNOSIS — Z940 Kidney transplant status: Secondary | ICD-10-CM | POA: Diagnosis not present

## 2023-01-04 DIAGNOSIS — D849 Immunodeficiency, unspecified: Secondary | ICD-10-CM | POA: Diagnosis not present

## 2023-01-11 DIAGNOSIS — Z0189 Encounter for other specified special examinations: Secondary | ICD-10-CM | POA: Diagnosis not present

## 2023-01-11 DIAGNOSIS — T86898 Other complications of other transplanted tissue: Secondary | ICD-10-CM | POA: Diagnosis not present

## 2023-01-11 DIAGNOSIS — N368 Other specified disorders of urethra: Secondary | ICD-10-CM | POA: Diagnosis not present

## 2023-01-11 DIAGNOSIS — Z940 Kidney transplant status: Secondary | ICD-10-CM | POA: Diagnosis not present

## 2023-01-11 DIAGNOSIS — X58XXXA Exposure to other specified factors, initial encounter: Secondary | ICD-10-CM | POA: Diagnosis not present

## 2023-01-11 DIAGNOSIS — Z466 Encounter for fitting and adjustment of urinary device: Secondary | ICD-10-CM | POA: Diagnosis not present

## 2023-01-17 DIAGNOSIS — Z79624 Long term (current) use of inhibitors of nucleotide synthesis: Secondary | ICD-10-CM | POA: Diagnosis not present

## 2023-01-17 DIAGNOSIS — R739 Hyperglycemia, unspecified: Secondary | ICD-10-CM | POA: Diagnosis not present

## 2023-01-17 DIAGNOSIS — Z0000 Encounter for general adult medical examination without abnormal findings: Secondary | ICD-10-CM | POA: Diagnosis not present

## 2023-01-17 DIAGNOSIS — G8918 Other acute postprocedural pain: Secondary | ICD-10-CM | POA: Diagnosis not present

## 2023-01-17 DIAGNOSIS — I10 Essential (primary) hypertension: Secondary | ICD-10-CM | POA: Diagnosis not present

## 2023-01-17 DIAGNOSIS — Z9181 History of falling: Secondary | ICD-10-CM | POA: Diagnosis not present

## 2023-01-17 DIAGNOSIS — T8619 Other complication of kidney transplant: Secondary | ICD-10-CM | POA: Diagnosis not present

## 2023-01-17 DIAGNOSIS — K219 Gastro-esophageal reflux disease without esophagitis: Secondary | ICD-10-CM | POA: Diagnosis not present

## 2023-01-17 DIAGNOSIS — N368 Other specified disorders of urethra: Secondary | ICD-10-CM | POA: Diagnosis not present

## 2023-01-17 DIAGNOSIS — D84821 Immunodeficiency due to drugs: Secondary | ICD-10-CM | POA: Diagnosis not present

## 2023-01-17 DIAGNOSIS — Z79899 Other long term (current) drug therapy: Secondary | ICD-10-CM | POA: Diagnosis not present

## 2023-01-17 DIAGNOSIS — Z940 Kidney transplant status: Secondary | ICD-10-CM | POA: Diagnosis not present

## 2023-01-17 DIAGNOSIS — T86898 Other complications of other transplanted tissue: Secondary | ICD-10-CM | POA: Diagnosis not present

## 2023-01-17 DIAGNOSIS — N135 Crossing vessel and stricture of ureter without hydronephrosis: Secondary | ICD-10-CM | POA: Diagnosis not present

## 2023-01-17 DIAGNOSIS — Z9884 Bariatric surgery status: Secondary | ICD-10-CM | POA: Diagnosis not present

## 2023-01-17 DIAGNOSIS — D849 Immunodeficiency, unspecified: Secondary | ICD-10-CM | POA: Diagnosis not present

## 2023-01-17 DIAGNOSIS — Z792 Long term (current) use of antibiotics: Secondary | ICD-10-CM | POA: Diagnosis not present

## 2023-01-17 DIAGNOSIS — I959 Hypotension, unspecified: Secondary | ICD-10-CM | POA: Diagnosis not present

## 2023-01-31 DIAGNOSIS — D84821 Immunodeficiency due to drugs: Secondary | ICD-10-CM | POA: Diagnosis not present

## 2023-01-31 DIAGNOSIS — K219 Gastro-esophageal reflux disease without esophagitis: Secondary | ICD-10-CM | POA: Diagnosis not present

## 2023-01-31 DIAGNOSIS — R739 Hyperglycemia, unspecified: Secondary | ICD-10-CM | POA: Diagnosis not present

## 2023-01-31 DIAGNOSIS — Z79899 Other long term (current) drug therapy: Secondary | ICD-10-CM | POA: Diagnosis not present

## 2023-01-31 DIAGNOSIS — N3289 Other specified disorders of bladder: Secondary | ICD-10-CM | POA: Diagnosis not present

## 2023-01-31 DIAGNOSIS — G8918 Other acute postprocedural pain: Secondary | ICD-10-CM | POA: Diagnosis not present

## 2023-01-31 DIAGNOSIS — Z9884 Bariatric surgery status: Secondary | ICD-10-CM | POA: Diagnosis not present

## 2023-01-31 DIAGNOSIS — T380X5A Adverse effect of glucocorticoids and synthetic analogues, initial encounter: Secondary | ICD-10-CM | POA: Diagnosis not present

## 2023-01-31 DIAGNOSIS — D849 Immunodeficiency, unspecified: Secondary | ICD-10-CM | POA: Diagnosis not present

## 2023-01-31 DIAGNOSIS — T8619 Other complication of kidney transplant: Secondary | ICD-10-CM | POA: Diagnosis not present

## 2023-01-31 DIAGNOSIS — Z466 Encounter for fitting and adjustment of urinary device: Secondary | ICD-10-CM | POA: Diagnosis not present

## 2023-01-31 DIAGNOSIS — T86898 Other complications of other transplanted tissue: Secondary | ICD-10-CM | POA: Diagnosis not present

## 2023-01-31 DIAGNOSIS — N135 Crossing vessel and stricture of ureter without hydronephrosis: Secondary | ICD-10-CM | POA: Diagnosis not present

## 2023-01-31 DIAGNOSIS — N368 Other specified disorders of urethra: Secondary | ICD-10-CM | POA: Diagnosis not present

## 2023-01-31 DIAGNOSIS — Z940 Kidney transplant status: Secondary | ICD-10-CM | POA: Diagnosis not present

## 2023-01-31 DIAGNOSIS — Z792 Long term (current) use of antibiotics: Secondary | ICD-10-CM | POA: Diagnosis not present

## 2023-02-05 DIAGNOSIS — M25561 Pain in right knee: Secondary | ICD-10-CM | POA: Diagnosis not present

## 2023-02-05 DIAGNOSIS — Z79899 Other long term (current) drug therapy: Secondary | ICD-10-CM | POA: Diagnosis not present

## 2023-02-05 DIAGNOSIS — D509 Iron deficiency anemia, unspecified: Secondary | ICD-10-CM | POA: Diagnosis not present

## 2023-02-05 DIAGNOSIS — N135 Crossing vessel and stricture of ureter without hydronephrosis: Secondary | ICD-10-CM | POA: Diagnosis not present

## 2023-02-05 DIAGNOSIS — Z4822 Encounter for aftercare following kidney transplant: Secondary | ICD-10-CM | POA: Diagnosis not present

## 2023-02-05 DIAGNOSIS — Z48298 Encounter for aftercare following other organ transplant: Secondary | ICD-10-CM | POA: Diagnosis not present

## 2023-02-05 DIAGNOSIS — R2241 Localized swelling, mass and lump, right lower limb: Secondary | ICD-10-CM | POA: Diagnosis not present

## 2023-02-05 DIAGNOSIS — D849 Immunodeficiency, unspecified: Secondary | ICD-10-CM | POA: Diagnosis not present

## 2023-02-05 DIAGNOSIS — Z940 Kidney transplant status: Secondary | ICD-10-CM | POA: Diagnosis not present

## 2023-02-05 DIAGNOSIS — D84821 Immunodeficiency due to drugs: Secondary | ICD-10-CM | POA: Diagnosis not present

## 2023-02-05 DIAGNOSIS — T8619 Other complication of kidney transplant: Secondary | ICD-10-CM | POA: Diagnosis not present

## 2023-02-05 DIAGNOSIS — E876 Hypokalemia: Secondary | ICD-10-CM | POA: Diagnosis not present

## 2023-02-05 DIAGNOSIS — E538 Deficiency of other specified B group vitamins: Secondary | ICD-10-CM | POA: Diagnosis not present

## 2023-02-05 DIAGNOSIS — R739 Hyperglycemia, unspecified: Secondary | ICD-10-CM | POA: Diagnosis not present

## 2023-02-14 DIAGNOSIS — Z9889 Other specified postprocedural states: Secondary | ICD-10-CM | POA: Diagnosis not present

## 2023-02-14 DIAGNOSIS — I10 Essential (primary) hypertension: Secondary | ICD-10-CM | POA: Diagnosis not present

## 2023-02-14 DIAGNOSIS — I132 Hypertensive heart and chronic kidney disease with heart failure and with stage 5 chronic kidney disease, or end stage renal disease: Secondary | ICD-10-CM | POA: Diagnosis not present

## 2023-02-14 DIAGNOSIS — Z79899 Other long term (current) drug therapy: Secondary | ICD-10-CM | POA: Diagnosis not present

## 2023-02-14 DIAGNOSIS — N135 Crossing vessel and stricture of ureter without hydronephrosis: Secondary | ICD-10-CM | POA: Diagnosis not present

## 2023-02-14 DIAGNOSIS — N3289 Other specified disorders of bladder: Secondary | ICD-10-CM | POA: Diagnosis not present

## 2023-02-14 DIAGNOSIS — I953 Hypotension of hemodialysis: Secondary | ICD-10-CM | POA: Diagnosis not present

## 2023-02-14 DIAGNOSIS — N186 End stage renal disease: Secondary | ICD-10-CM | POA: Diagnosis not present

## 2023-02-14 DIAGNOSIS — Z940 Kidney transplant status: Secondary | ICD-10-CM | POA: Diagnosis not present

## 2023-02-14 DIAGNOSIS — Z992 Dependence on renal dialysis: Secondary | ICD-10-CM | POA: Diagnosis not present

## 2023-02-14 DIAGNOSIS — T86898 Other complications of other transplanted tissue: Secondary | ICD-10-CM | POA: Diagnosis not present

## 2023-02-14 DIAGNOSIS — Z79624 Long term (current) use of inhibitors of nucleotide synthesis: Secondary | ICD-10-CM | POA: Diagnosis not present

## 2023-02-14 DIAGNOSIS — M25561 Pain in right knee: Secondary | ICD-10-CM | POA: Diagnosis not present

## 2023-02-14 DIAGNOSIS — M8580 Other specified disorders of bone density and structure, unspecified site: Secondary | ICD-10-CM | POA: Diagnosis not present

## 2023-02-14 DIAGNOSIS — Z436 Encounter for attention to other artificial openings of urinary tract: Secondary | ICD-10-CM | POA: Diagnosis not present

## 2023-02-14 DIAGNOSIS — T8619 Other complication of kidney transplant: Secondary | ICD-10-CM | POA: Diagnosis not present

## 2023-02-14 DIAGNOSIS — G8918 Other acute postprocedural pain: Secondary | ICD-10-CM | POA: Diagnosis not present

## 2023-02-14 DIAGNOSIS — D849 Immunodeficiency, unspecified: Secondary | ICD-10-CM | POA: Diagnosis not present

## 2023-02-14 DIAGNOSIS — R739 Hyperglycemia, unspecified: Secondary | ICD-10-CM | POA: Diagnosis not present

## 2023-02-14 DIAGNOSIS — N368 Other specified disorders of urethra: Secondary | ICD-10-CM | POA: Diagnosis not present

## 2023-02-14 DIAGNOSIS — M1711 Unilateral primary osteoarthritis, right knee: Secondary | ICD-10-CM | POA: Diagnosis not present

## 2023-02-14 DIAGNOSIS — Z960 Presence of urogenital implants: Secondary | ICD-10-CM | POA: Diagnosis not present

## 2023-02-14 DIAGNOSIS — I959 Hypotension, unspecified: Secondary | ICD-10-CM | POA: Diagnosis not present

## 2023-02-14 DIAGNOSIS — M76891 Other specified enthesopathies of right lower limb, excluding foot: Secondary | ICD-10-CM | POA: Diagnosis not present

## 2023-02-14 DIAGNOSIS — Z792 Long term (current) use of antibiotics: Secondary | ICD-10-CM | POA: Diagnosis not present

## 2023-02-14 DIAGNOSIS — D84821 Immunodeficiency due to drugs: Secondary | ICD-10-CM | POA: Diagnosis not present

## 2023-02-14 DIAGNOSIS — Z466 Encounter for fitting and adjustment of urinary device: Secondary | ICD-10-CM | POA: Diagnosis not present

## 2023-02-14 DIAGNOSIS — K219 Gastro-esophageal reflux disease without esophagitis: Secondary | ICD-10-CM | POA: Diagnosis not present

## 2023-02-19 ENCOUNTER — Encounter (HOSPITAL_BASED_OUTPATIENT_CLINIC_OR_DEPARTMENT_OTHER): Payer: Self-pay | Admitting: Student

## 2023-02-19 ENCOUNTER — Encounter (HOSPITAL_BASED_OUTPATIENT_CLINIC_OR_DEPARTMENT_OTHER): Payer: Self-pay

## 2023-02-19 ENCOUNTER — Ambulatory Visit (HOSPITAL_BASED_OUTPATIENT_CLINIC_OR_DEPARTMENT_OTHER): Payer: 59

## 2023-02-19 ENCOUNTER — Ambulatory Visit (HOSPITAL_BASED_OUTPATIENT_CLINIC_OR_DEPARTMENT_OTHER): Payer: 59 | Admitting: Student

## 2023-02-19 DIAGNOSIS — G8929 Other chronic pain: Secondary | ICD-10-CM | POA: Diagnosis not present

## 2023-02-19 DIAGNOSIS — M25561 Pain in right knee: Secondary | ICD-10-CM | POA: Diagnosis not present

## 2023-02-19 DIAGNOSIS — M1711 Unilateral primary osteoarthritis, right knee: Secondary | ICD-10-CM | POA: Diagnosis not present

## 2023-02-19 MED ORDER — LIDOCAINE HCL 1 % IJ SOLN
4.0000 mL | INTRAMUSCULAR | Status: AC | PRN
  Administered 2023-02-19: 4 mL

## 2023-02-19 MED ORDER — TRIAMCINOLONE ACETONIDE 40 MG/ML IJ SUSP
2.0000 mL | INTRAMUSCULAR | Status: AC | PRN
  Administered 2023-02-19: 2 mL via INTRA_ARTICULAR

## 2023-02-19 NOTE — Progress Notes (Signed)
Chief Complaint: Right knee pain     History of Present Illness:   Discussed the use of AI scribe software for clinical note transcription with the patient, who gave verbal consent to proceed.  Vernon Blackburn is a 58 y.o. male presents with right knee pain that started after a fall a couple of weeks ago. The patient was ascending stairs when he miscalculated a step due to the heaviness of his leg, a symptom he attributes to edema. The fall resulted in direct impact to the knee, causing immediate pain. The pain has persisted since the incident, described as throbbing and severe enough to cause concern about the knee giving out. The patient also reports a popping sound when he bent down to pick up an item, which further exacerbated the pain and difficulty in walking. The pain is localized to the outer part of the knee, and he rates this as 8/10. The patient is worried that his current symptoms are similar to his past meniscus injury in the same knee.  Surgical History:   Right knee arthroscopy with meniscal repair 2010  PMH/PSH/Family History/Social History/Meds/Allergies:    Past Medical History:  Diagnosis Date   ADHD    Anemia, chronic renal failure    Asthma    followed by pcp   Benign localized prostatic hyperplasia with lower urinary tract symptoms (LUTS)    Bladder stones    BPH (benign prostatic hyperplasia)    Chronic gout    05-06-2020  per pt last episode 6 months ago,  (involves right knee and right great toe)   Chronic systolic HF (heart failure) (HCC)    followed by cardiology, dr Shirlee Latch @ [facility] clinic,   last TEE 03-06-2019 in epic ef 45%   Dyspnea    ESRD on hemodialysis [facility])    primary nephrologist--- dr Kathrene Bongo--- HD on M/W/ F  at [facility] in [place]   Heart murmur    History of bladder stone    History of cellulitis 01/27/2020   ED visit ,  abdominal wall   History of CVA (cerebrovascular  accident) without residual deficits    remote infarct per imaging MRI brain in care every where 02-07-2019   History of kidney stones    History of obstructive sleep apnea    05-06-2020  per pt test positive for osa prior to gastric bypass in 2014 used cpap,  pt stated was retested after alot of weight loss , test was negative    Hypertension    followed by cardiology   NICM (nonischemic cardiomyopathy) (HCC)    followed by cardiology---- 01/ 2018  ef 20-35%, severe MV stenosis;  last TEE echo in epic 03-06-2019  ef 45%   Peripheral edema    Bilateral lower extremity    S/P minimally invasive mitral valve repair followed by cardiology   06-29-2016  for severe stenosis ;  30 mm [name] via right mini thoracotomy approach;  last TEE echo in epic 03-06-2019 ef 45%, mild LVH, post op mild MR with mean gradient and no significant stenosis   Seasonal and perennial allergic rhinitis    Sleep apnea    Past Surgical History:  Procedure Laterality Date   AV FISTULA PLACEMENT Left 03/10/2019   Procedure: LEFT ARM BASILIC ARTERIOVENOUS (  AV) FISTULA CREATION FIRST STAGE;  Surgeon: Cephus Shelling, MD;  Location[facility] OR;  Service: Vascular;  Laterality: Left;   BASCILIC VEIN TRANSPOSITION Left 05/01/2019   Procedure: LEFT SECOND STAGE BASCILIC VEIN TRANSPOSITION.;  Surgeon: Cephus Shelling, MD;  Location[facility];  Service: Vascular;  Laterality: Left;   BIOPSY  03/09/2021   Procedure: BIOPSY;  Surgeon: Shellia Cleverly, DO;  Location: [facility] ENDOSCOPY;  Service: Gastroenterology;;   COLONOSCOPY WITH PROPOFOL N/A 03/09/2021   Procedure: COLONOSCOPY WITH PROPOFOL;  Surgeon: Shellia Cleverly, DO;  Location: [facility] ENDOSCOPY;  Service: Gastroenterology;  Laterality: N/A;   CYSTOSCOPY/URETEROSCOPY/HOLMIUM LASER/STENT PLACEMENT Right 07/17/2018   Procedure: CYSTOSCOPY/RETROGRADE/URETEROSCOPY/HOLMIUM LASER/STENT PLACEMENT/ CYSTOLITHALOPAXY;  Surgeon: Rene Paci, MD;   Location: [facility] ORS;  Service: Urology;  Laterality: Right;   GASTRIC BYPASS  01/2012   IR CATHETER TUBE CHANGE  08/05/2020   IR CATHETER TUBE CHANGE  09/07/2020   IR CATHETER TUBE CHANGE  10/05/2020   IR PARACENTESIS  07/13/2020   IR PARACENTESIS  09/07/2020   IR PARACENTESIS  09/15/2020   IR PARACENTESIS  10/11/2020   IR PARACENTESIS  10/28/2020   IR PARACENTESIS  11/16/2020   IR PARACENTESIS  12/07/2020   IR PARACENTESIS  12/21/2020   IR PARACENTESIS  01/10/2021   IR PARACENTESIS  01/27/2021   IR PARACENTESIS  02/11/2021   IR PARACENTESIS  03/01/2021   IR PARACENTESIS  03/24/2021   IR PARACENTESIS  04/08/2021   IR PARACENTESIS  04/25/2021   IR PARACENTESIS  05/24/2021   IR PARACENTESIS  06/21/2021   IR PARACENTESIS  08/08/2021   IR PARACENTESIS  10/14/2021   IR PARACENTESIS  11/08/2021   IR PARACENTESIS  12/06/2021   IR PARACENTESIS  01/26/2022   IR PARACENTESIS  02/16/2022   KNEE ARTHROSCOPY W/ MENISCAL REPAIR Right 05/2008   LAPAROSCOPIC INGUINAL HERNIA REPAIR Left 03-24-2015  @ [facility]   MITRAL VALVE REPAIR Right 06/29/2016   Procedure: MINIMALLY INVASIVE MITRAL VALVE REPAIR  (MVR);  Surgeon: Purcell Nails, MD;  Location: [facility] OR;  Service: Open Heart Surgery;  Laterality: Right;   PERITONEAL CATHETER INSERTION  08-18-2019  @[facility]    AND OPEN RIGHT INGUINAL HERNIA REPAIR   PERITONEAL CATHETER REMOVAL  03-31-2020  @[facility]    RIGHT HEART CATH N/A 10/19/2020   Procedure: RIGHT HEART CATH;  Surgeon: Laurey Morale, MD;  Location: [facility] INVASIVE CV LAB;  Service: Cardiovascular;  Laterality: N/A;   RIGHT/LEFT HEART CATH AND CORONARY ANGIOGRAPHY N/A 06/07/2016   Procedure: Right/Left Heart Cath and Coronary Angiography;  Surgeon: Laurey Morale, MD;  Location: [facility] INVASIVE CV LAB;  Service: Cardiovascular;  Laterality: N/A;   TEE WITHOUT CARDIOVERSION N/A 05/04/2016   Procedure: TRANSESOPHAGEAL ECHOCARDIOGRAM (TEE);  Surgeon: Laurey Morale, MD;  Location: [facility] ENDOSCOPY;  Service: Cardiovascular;   Laterality: N/A;   TEE WITHOUT CARDIOVERSION N/A 06/29/2016   Procedure: TRANSESOPHAGEAL ECHOCARDIOGRAM (TEE);  Surgeon: Purcell Nails, MD;  Location: [facility] OR;  Service: Open Heart Surgery;  Laterality: N/A;   TEE WITHOUT CARDIOVERSION N/A 03/06/2019   Procedure: TRANSESOPHAGEAL ECHOCARDIOGRAM (TEE);  Surgeon: Laurey Morale, MD;  Location: [facility] ENDOSCOPY;  Service: Cardiovascular;  Laterality: N/A;   TRANSURETHRAL RESECTION OF PROSTATE N/A 05/25/2020   Procedure: TRANSURETHRAL RESECTION OF THE PROSTATE (TURP) WITH CYSTOLITHOLAPAXY;  Surgeon: Rene Paci, MD;  Location: [facility] ORS;  Service: Urology;  Laterality: N/A;   UMBILICAL HERNIA REPAIR  09-05-2012  @[facility]    URETHROPLASTY RECONSTRUCTION / REPAIR PROSTATIC / MEMBRANOUS URETHRA  01/13/2021  [facility]   Social History   Socioeconomic History   Marital status: Married    Spouse name: Not on file   Number of children: Not on file   Years of education: Not on file   Highest education level: Not on file  Occupational History   Not on file  Tobacco Use   Smoking status: Never   Smokeless tobacco: Never  Vaping Use   Vaping status: Never Used  Substance and Sexual Activity   Alcohol use: Not Currently   Drug use: Never   Sexual activity: Not on file  Other Topics Concern   Not on file  Social History Narrative   Not on file   Social Drivers of Health   Financial Resource Strain: Low Risk  (12/04/2022)   Received from [facility]   Overall Financial Resource Strain (CARDIA)    Difficulty of Paying Living Expenses: Not hard at all  Food Insecurity: No Food Insecurity (12/04/2022)   Received from [facility]   Hunger Vital Sign    Worried About Running Out of Food in the Last Year: Never true    Ran Out of Food in the Last Year: Never true  Transportation Needs: No Transportation Needs (12/04/2022)   Received from [facility] - Transportation    In the  past 12 months, has lack of transportation kept you from medical appointments or from getting medications?: No    Lack of Transportation (Non-Medical): No  Physical Activity: Unknown (02/06/2022)   Received from [facility]   Exercise Vital Sign    Days of Exercise per Week: 3 days    Minutes of Exercise per Session: Not on file  Stress: No Stress Concern Present (02/06/2022)   Received from [facility], [facility] - Occupational Stress Questionnaire    Feeling of Stress : Only a little  Social Connections: Socially Integrated (02/06/2022)   Received from [facility]   Social Network    How would you rate your social network (family, work, friends)?: Good participation with social networks   Family History  Problem Relation Age of Onset   Diabetes Mother    Hypertension Mother    Heart failure Mother    Colon cancer Neg Hx    Esophageal cancer Neg Hx    Rectal cancer Neg Hx    Stomach cancer Neg Hx    Allergies  Allergen Reactions   Chlorhexidine Itching   Isosorbide     Hypotension    Current Outpatient Medications  Medication Sig Dispense Refill   Albuterol Sulfate (PROAIR RESPICLICK) 108 (90 Base) MCG/ACT AEPB Inhale 2 puffs into the lungs every 6 (six) hours as needed (wheezing/shortness of breath).     aspirin EC 81 MG tablet Take 81 mg by mouth daily. Swallow whole.     betamethasone 0.5% cream-vitamin a&d ointment 1:1 mixture Apply topically.     calcium acetate (PHOSLO) 667 MG capsule Take 2,668 mg by mouth 3 (three) times daily with meals.     lactulose (CHRONULAC) 10 GM/15ML solution Take 10 g by mouth 2 (two) times daily as needed for mild constipation.     midodrine (PROAMATINE) 10 MG tablet Take 1.5 tablets (15 mg total) by mouth every Monday, Wednesday, and Friday with hemodialysis. 60 tablet 3   polyethylene glycol (MIRALAX / GLYCOLAX) 17 g packet Take 17 g by mouth every other day.  VYVANSE 30 MG capsule Take 30 mg by mouth every morning.     No current facility-administered medications for this visit.   No results found.  Review of Systems:   A ROS was performed including pertinent positives and negatives as documented in the HPI.  Physical Exam :   Constitutional: NAD and appears stated age Neurological: Alert and oriented Psych: Appropriate affect and cooperative There were no vitals taken for this visit.   Comprehensive Musculoskeletal Exam:    Right knee exam demonstrates active range of motion from 0 to 90 degrees.  Diffuse edema but no palpable effusion.  Positive for lateral joint line tenderness.  No instability with varus or valgus stress.  Knee flexion and extension strength 5/5.  Imaging:   Xray (right knee 4 views): Tricompartmental osteoarthritis that is bone-on-bone of the medial compartment.  Diffuse osteophytes.   I personally reviewed and interpreted the radiographs.   Assessment:   Knee Pain post-fall   Two weeks ago, direct trauma to the knee resulted in persistent pain and swelling. There is a history of meniscus tear in the same knee. X-ray reveals advanced osteoarthritis, especially on the medial side.  Given his mechanism, also a possible contusion laterally although unable to rule out meniscal injury.  Instability and fear of the knee giving out are reported. Administer an intra-articular steroid injection today for immediate relief. Advise limiting activity for the rest of the day post-injection. Monitor response to the injection over the coming weeks.  Osteoarthritis of the Knee   X-ray shows advanced osteoarthritis, particularly on the medial side, with a history of meniscus tear and recent trauma. Continue conservative management with activity modification and over-the-counter pain relief. Discuss potential future interventions, including PRP and joint replacement surgery, based on symptom progression and arthritis.  Plan :     -Right knee cortisone injection performed today -Return to clinic as needed    Procedure Note  Patient: Vernon Blackburn             Date of Birth: 11-Sep-1965           MRN: [id number]             Visit Date: 02/19/2023  Procedures: Visit Diagnoses:  1. Unilateral primary osteoarthritis, right knee     Large Joint Inj: R knee on 02/19/2023 2:19 PM Indications: pain Details: 22 G 1.5 in needle, anterolateral approach Medications: 4 mL lidocaine 1 %; 2 mL triamcinolone acetonide 40 MG/ML Outcome: tolerated well, no immediate complications Procedure, treatment alternatives, risks and benefits explained, specific risks discussed. Consent was given by the patient. Immediately prior to procedure a time out was called to verify the correct patient, procedure, equipment, support staff and site/side marked as required. Patient was prepped and draped in the usual sterile fashion.      I personally saw and evaluated the patient, and participated in the management and treatment plan.  Hazle Nordmann, PA-C Orthopedics

## 2023-02-21 DIAGNOSIS — Z299 Encounter for prophylactic measures, unspecified: Secondary | ICD-10-CM | POA: Diagnosis not present

## 2023-02-21 DIAGNOSIS — N3281 Overactive bladder: Secondary | ICD-10-CM | POA: Diagnosis not present

## 2023-02-21 DIAGNOSIS — M25561 Pain in right knee: Secondary | ICD-10-CM | POA: Diagnosis not present

## 2023-02-21 DIAGNOSIS — Z940 Kidney transplant status: Secondary | ICD-10-CM | POA: Diagnosis not present

## 2023-02-21 DIAGNOSIS — Z792 Long term (current) use of antibiotics: Secondary | ICD-10-CM | POA: Diagnosis not present

## 2023-02-21 DIAGNOSIS — D849 Immunodeficiency, unspecified: Secondary | ICD-10-CM | POA: Diagnosis not present

## 2023-02-21 DIAGNOSIS — Z79899 Other long term (current) drug therapy: Secondary | ICD-10-CM | POA: Diagnosis not present

## 2023-02-21 DIAGNOSIS — N135 Crossing vessel and stricture of ureter without hydronephrosis: Secondary | ICD-10-CM | POA: Diagnosis not present

## 2023-02-21 DIAGNOSIS — E0965 Drug or chemical induced diabetes mellitus with hyperglycemia: Secondary | ICD-10-CM | POA: Diagnosis not present

## 2023-02-21 DIAGNOSIS — T8619 Other complication of kidney transplant: Secondary | ICD-10-CM | POA: Diagnosis not present

## 2023-02-21 DIAGNOSIS — K219 Gastro-esophageal reflux disease without esophagitis: Secondary | ICD-10-CM | POA: Diagnosis not present

## 2023-02-21 DIAGNOSIS — Z936 Other artificial openings of urinary tract status: Secondary | ICD-10-CM | POA: Diagnosis not present

## 2023-02-21 DIAGNOSIS — Z5181 Encounter for therapeutic drug level monitoring: Secondary | ICD-10-CM | POA: Diagnosis not present

## 2023-02-21 DIAGNOSIS — I10 Essential (primary) hypertension: Secondary | ICD-10-CM | POA: Diagnosis not present

## 2023-02-21 DIAGNOSIS — Z9884 Bariatric surgery status: Secondary | ICD-10-CM | POA: Diagnosis not present

## 2023-02-26 ENCOUNTER — Ambulatory Visit (HOSPITAL_BASED_OUTPATIENT_CLINIC_OR_DEPARTMENT_OTHER): Payer: 59 | Admitting: Student

## 2023-02-26 DIAGNOSIS — M1711 Unilateral primary osteoarthritis, right knee: Secondary | ICD-10-CM

## 2023-02-26 DIAGNOSIS — Z940 Kidney transplant status: Secondary | ICD-10-CM | POA: Diagnosis not present

## 2023-02-26 DIAGNOSIS — Z5181 Encounter for therapeutic drug level monitoring: Secondary | ICD-10-CM | POA: Diagnosis not present

## 2023-02-26 DIAGNOSIS — D849 Immunodeficiency, unspecified: Secondary | ICD-10-CM | POA: Diagnosis not present

## 2023-02-26 NOTE — Progress Notes (Signed)
 Chief Complaint: Right knee pain     History of Present Illness:   02/26/23: Patient is here today following up pain of his right knee.  He reports that injection given in clinic 1 week ago has not provided any relief.  His knee continues to have pain and stiffness most noticeable after periods of inactivity which slowly improves with motion.  Has been icing and taking Tylenol arthritis as well as tramadol with mild improvements.  Pain is moderate to severe and is most pronounced over the medial joint line.  He does have an appointment scheduled with a joint replacement surgeon on 3/11 with [facility] to discuss further management.   JODEY BURBANO is a 58 y.o. male presents with right knee pain that started after a fall a couple of weeks ago. The patient was ascending stairs when he miscalculated a step due to the heaviness of his leg, a symptom he attributes to edema. The fall resulted in direct impact to the knee, causing immediate pain. The pain has persisted since the incident, described as throbbing and severe enough to cause concern about the knee giving out. The patient also reports a popping sound when he bent down to pick up an item, which further exacerbated the pain and difficulty in walking. The pain is localized to the outer part of the knee, and he rates this as 8/10. The patient is worried that his current symptoms are similar to his past meniscus injury in the same knee.  Surgical History:   Right knee arthroscopy with meniscal repair 2010  PMH/PSH/Family History/Social History/Meds/Allergies:    Past Medical History:  Diagnosis Date   ADHD    Anemia, chronic renal failure    Asthma    followed by pcp   Benign localized prostatic hyperplasia with lower urinary tract symptoms (LUTS)    Bladder stones    BPH (benign prostatic hyperplasia)    Chronic gout    05-06-2020  per pt last episode 6 months ago,  (involves right knee and right great toe)    Chronic systolic HF (heart failure) (HCC)    followed by cardiology, dr Shirlee Latch @ [facility],   last TEE 03-06-2019 in epic ef 45%   Dyspnea    ESRD on hemodialysis [facility])    primary nephrologist--- dr Kathrene Bongo--- HD on M/W/ F  at [facility] in [place]   Heart murmur    History of bladder stone    History of cellulitis 01/27/2020   ED visit ,  abdominal wall   History of CVA (cerebrovascular accident) without residual deficits    remote infarct per imaging MRI brain in care every where 02-07-2019   History of kidney stones    History of obstructive sleep apnea    05-06-2020  per pt test positive for osa prior to gastric bypass in 2014 used cpap,  pt stated was retested after alot of weight loss , test was negative    Hypertension    followed by cardiology   NICM (nonischemic cardiomyopathy) (HCC)    followed by cardiology---- 01/ 2018  ef 20-35%, severe MV stenosis;  last TEE echo in epic 03-06-2019  ef 45%   Peripheral edema    Bilateral lower extremity    S/P minimally invasive mitral valve repair followed by cardiology  06-29-2016  for severe stenosis ;  30 mm [name] via right mini thoracotomy approach;  last TEE echo in epic 03-06-2019 ef 45%, mild LVH, post op mild MR with mean gradient and no significant stenosis   Seasonal and perennial allergic rhinitis    Sleep apnea    Past Surgical History:  Procedure Laterality Date   AV FISTULA PLACEMENT Left 03/10/2019   Procedure: LEFT ARM BASILIC ARTERIOVENOUS (AV) FISTULA CREATION FIRST STAGE;  Surgeon: Cephus Shelling, MD;  Location: [facility] OR;  Service: Vascular;  Laterality: Left;   BASCILIC VEIN TRANSPOSITION Left 05/01/2019   Procedure: LEFT SECOND STAGE BASCILIC VEIN TRANSPOSITION.;  Surgeon: Cephus Shelling, MD;  Location: [facility] OR;  Service: Vascular;  Laterality: Left;   BIOPSY  03/09/2021   Procedure: BIOPSY;  Surgeon: Shellia Cleverly, DO;  Location: [facility]  ENDOSCOPY;  Service: Gastroenterology;;   COLONOSCOPY WITH PROPOFOL N/A 03/09/2021   Procedure: COLONOSCOPY WITH PROPOFOL;  Surgeon: Shellia Cleverly, DO;  Location: [facility] ENDOSCOPY;  Service: Gastroenterology;  Laterality: N/A;   CYSTOSCOPY/URETEROSCOPY/HOLMIUM LASER/STENT PLACEMENT Right 07/17/2018   Procedure: CYSTOSCOPY/RETROGRADE/URETEROSCOPY/HOLMIUM LASER/STENT PLACEMENT/ CYSTOLITHALOPAXY;  Surgeon: Rene Paci, MD;  Location: [facility] ORS;  Service: Urology;  Laterality: Right;   GASTRIC BYPASS  01/2012   IR CATHETER TUBE CHANGE  08/05/2020   IR CATHETER TUBE CHANGE  09/07/2020   IR CATHETER TUBE CHANGE  10/05/2020   IR PARACENTESIS  07/13/2020   IR PARACENTESIS  09/07/2020   IR PARACENTESIS  09/15/2020   IR PARACENTESIS  10/11/2020   IR PARACENTESIS  10/28/2020   IR PARACENTESIS  11/16/2020   IR PARACENTESIS  12/07/2020   IR PARACENTESIS  12/21/2020   IR PARACENTESIS  01/10/2021   IR PARACENTESIS  01/27/2021   IR PARACENTESIS  02/11/2021   IR PARACENTESIS  03/01/2021   IR PARACENTESIS  03/24/2021   IR PARACENTESIS  04/08/2021   IR PARACENTESIS  04/25/2021   IR PARACENTESIS  05/24/2021   IR PARACENTESIS  06/21/2021   IR PARACENTESIS  08/08/2021   IR PARACENTESIS  10/14/2021   IR PARACENTESIS  11/08/2021   IR PARACENTESIS  12/06/2021   IR PARACENTESIS  01/26/2022   IR PARACENTESIS  02/16/2022   KNEE ARTHROSCOPY W/ MENISCAL REPAIR Right 05/2008   LAPAROSCOPIC INGUINAL HERNIA REPAIR Left 03-24-2015  @ [facility]   MITRAL VALVE REPAIR Right 06/29/2016   Procedure: MINIMALLY INVASIVE MITRAL VALVE REPAIR  (MVR);  Surgeon: Purcell Nails, MD;  Location: [facility] OR;  Service: Open Heart Surgery;  Laterality: Right;   PERITONEAL CATHETER INSERTION  08-18-2019  @[facility]    AND OPEN RIGHT INGUINAL HERNIA REPAIR   PERITONEAL CATHETER REMOVAL  03-31-2020  @[facility]    RIGHT HEART CATH N/A 10/19/2020   Procedure: RIGHT HEART CATH;  Surgeon: Laurey Morale, MD;  Location: [facility] INVASIVE CV LAB;  Service:  Cardiovascular;  Laterality: N/A;   RIGHT/LEFT HEART CATH AND CORONARY ANGIOGRAPHY N/A 06/07/2016   Procedure: Right/Left Heart Cath and Coronary Angiography;  Surgeon: Laurey Morale, MD;  Location: [facility] INVASIVE CV LAB;  Service: Cardiovascular;  Laterality: N/A;   TEE WITHOUT CARDIOVERSION N/A 05/04/2016   Procedure: TRANSESOPHAGEAL ECHOCARDIOGRAM (TEE);  Surgeon: Laurey Morale, MD;  Location: [facility] ENDOSCOPY;  Service: Cardiovascular;  Laterality: N/A;   TEE WITHOUT CARDIOVERSION N/A 06/29/2016   Procedure: TRANSESOPHAGEAL ECHOCARDIOGRAM (TEE);  Surgeon: Purcell Nails, MD;  Location: [facility] OR;  Service: Open Heart Surgery;  Laterality: N/A;   TEE WITHOUT CARDIOVERSION N/A 03/06/2019  Procedure: TRANSESOPHAGEAL ECHOCARDIOGRAM (TEE);  Surgeon: Laurey Morale, MD;  Location: [facility] ENDOSCOPY;  Service: Cardiovascular;  Laterality: N/A;   TRANSURETHRAL RESECTION OF PROSTATE N/A 05/25/2020   Procedure: TRANSURETHRAL RESECTION OF THE PROSTATE (TURP) WITH CYSTOLITHOLAPAXY;  Surgeon: Rene Paci, MD;  Location: [facility] ORS;  Service: Urology;  Laterality: N/A;   UMBILICAL HERNIA REPAIR  09-05-2012  @[facility]    URETHROPLASTY RECONSTRUCTION / REPAIR PROSTATIC / MEMBRANOUS URETHRA  01/13/2021   [facility]   Social History   Socioeconomic History   Marital status: Married    Spouse name: Not on file   Number of children: Not on file   Years of education: Not on file   Highest education level: Not on file  Occupational History   Not on file  Tobacco Use   Smoking status: Never   Smokeless tobacco: Never  Vaping Use   Vaping status: Never Used  Substance and Sexual Activity   Alcohol use: Not Currently   Drug use: Never   Sexual activity: Not on file  Other Topics Concern   Not on file  Social History Narrative   Not on file   Social Drivers of Health   Financial Resource Strain: Low Risk  (12/04/2022)   Received from [facility]   Overall Financial Resource Strain  (CARDIA)    Difficulty of Paying Living Expenses: Not hard at all  Food Insecurity: No Food Insecurity (12/04/2022)   Received from [facility]   Hunger Vital Sign    Worried About Running Out of Food in the Last Year: Never true    Ran Out of Food in the Last Year: Never true  Transportation Needs: No Transportation Needs (12/04/2022)   Received from [facility] - Transportation    In the past 12 months, has lack of transportation kept you from medical appointments or from getting medications?: No    Lack of Transportation (Non-Medical): No  Physical Activity: Unknown (02/06/2022)   Received from [facility]   Exercise Vital Sign    Days of Exercise per Week: 3 days    Minutes of Exercise per Session: Not on file  Stress: No Stress Concern Present (02/06/2022)   Received from [facility], [facility] - Occupational Stress Questionnaire    Feeling of Stress : Only a little  Social Connections: Socially Integrated (02/06/2022)   Received from [facility], [facility]   Social Network    How would you rate your social network (family, work, friends)?: Good participation with social networks   Family History  Problem Relation Age of Onset   Diabetes Mother    Hypertension Mother    Heart failure Mother    Colon cancer Neg Hx    Esophageal cancer Neg Hx    Rectal cancer Neg Hx    Stomach cancer Neg Hx    Allergies  Allergen Reactions   Chlorhexidine Itching   Isosorbide     Hypotension    Current Outpatient Medications  Medication Sig Dispense Refill   Albuterol Sulfate (PROAIR RESPICLICK) 108 (90 Base) MCG/ACT AEPB Inhale 2 puffs into the lungs every 6 (six) hours as needed (wheezing/shortness of breath).     aspirin EC 81 MG tablet Take 81 mg by mouth daily. Swallow whole.     betamethasone 0.5% cream-vitamin a&d ointment 1:1 mixture Apply topically.     calcium  acetate (PHOSLO) 667 MG capsule Take 2,668 mg  by mouth 3 (three) times daily with meals.     lactulose (CHRONULAC) 10 GM/15ML solution Take 10 g by mouth 2 (two) times daily as needed for mild constipation.     midodrine (PROAMATINE) 10 MG tablet Take 1.5 tablets (15 mg total) by mouth every Monday, Wednesday, and Friday with hemodialysis. 60 tablet 3   polyethylene glycol (MIRALAX / GLYCOLAX) 17 g packet Take 17 g by mouth every other day.     VYVANSE 30 MG capsule Take 30 mg by mouth every morning.     No current facility-administered medications for this visit.   No results found.  Review of Systems:   A ROS was performed including pertinent positives and negatives as documented in the HPI.  Physical Exam :   Constitutional: NAD and appears stated age Neurological: Alert and oriented Psych: Appropriate affect and cooperative There were no vitals taken for this visit.   Comprehensive Musculoskeletal Exam:    Active range of motion of the right knee is from 0 to 90 degrees with palpable crepitus.  Tenderness over the medial joint line.  Edema present surrounding the knee and right lower extremity without overlying erythema or warmth.  Stable collaterals with varus and valgus stress.  Imaging:    Assessment:    58 year old male with continued right knee pain as a result of a direct fall 3 weeks ago.  He does have evidence on x-rays of severe medial compartment and moderate patellofemoral arthritis but did not get relief from cortisone injection at last visit.  I believe there is a likely component of a contusion given his persistent symptoms and mechanism of injury however I would like him to still meet with a joint replacement surgeon in order to discuss potential treatment options.  I have recommended topical Voltaren with aims of more symptom relief as he is unable to take oral NSAIDs.  Can plan to follow-up in clinic as needed.  Plan :    -Return to clinic as needed    I  personally saw and evaluated the patient, and participated in the management and treatment plan.  Hazle Nordmann, PA-C Orthopedics

## 2023-02-27 DIAGNOSIS — Z4822 Encounter for aftercare following kidney transplant: Secondary | ICD-10-CM | POA: Diagnosis not present

## 2023-02-27 DIAGNOSIS — Z5181 Encounter for therapeutic drug level monitoring: Secondary | ICD-10-CM | POA: Diagnosis not present

## 2023-02-27 DIAGNOSIS — I959 Hypotension, unspecified: Secondary | ICD-10-CM | POA: Diagnosis not present

## 2023-02-27 DIAGNOSIS — Z936 Other artificial openings of urinary tract status: Secondary | ICD-10-CM | POA: Diagnosis not present

## 2023-02-27 DIAGNOSIS — N135 Crossing vessel and stricture of ureter without hydronephrosis: Secondary | ICD-10-CM | POA: Diagnosis not present

## 2023-02-27 DIAGNOSIS — Z940 Kidney transplant status: Secondary | ICD-10-CM | POA: Diagnosis not present

## 2023-02-27 DIAGNOSIS — G8918 Other acute postprocedural pain: Secondary | ICD-10-CM | POA: Diagnosis not present

## 2023-02-27 DIAGNOSIS — Z299 Encounter for prophylactic measures, unspecified: Secondary | ICD-10-CM | POA: Diagnosis not present

## 2023-02-27 DIAGNOSIS — I10 Essential (primary) hypertension: Secondary | ICD-10-CM | POA: Diagnosis not present

## 2023-02-27 DIAGNOSIS — Z79899 Other long term (current) drug therapy: Secondary | ICD-10-CM | POA: Diagnosis not present

## 2023-02-27 DIAGNOSIS — E876 Hypokalemia: Secondary | ICD-10-CM | POA: Diagnosis not present

## 2023-02-27 DIAGNOSIS — D849 Immunodeficiency, unspecified: Secondary | ICD-10-CM | POA: Diagnosis not present

## 2023-02-27 DIAGNOSIS — T8619 Other complication of kidney transplant: Secondary | ICD-10-CM | POA: Diagnosis not present

## 2023-02-27 DIAGNOSIS — R2241 Localized swelling, mass and lump, right lower limb: Secondary | ICD-10-CM | POA: Diagnosis not present

## 2023-02-27 DIAGNOSIS — R600 Localized edema: Secondary | ICD-10-CM | POA: Diagnosis not present

## 2023-02-27 DIAGNOSIS — I110 Hypertensive heart disease with heart failure: Secondary | ICD-10-CM | POA: Diagnosis not present

## 2023-02-27 DIAGNOSIS — D649 Anemia, unspecified: Secondary | ICD-10-CM | POA: Diagnosis not present

## 2023-02-27 DIAGNOSIS — N3289 Other specified disorders of bladder: Secondary | ICD-10-CM | POA: Diagnosis not present

## 2023-02-27 DIAGNOSIS — Z9884 Bariatric surgery status: Secondary | ICD-10-CM | POA: Diagnosis not present

## 2023-02-27 DIAGNOSIS — I509 Heart failure, unspecified: Secondary | ICD-10-CM | POA: Diagnosis not present

## 2023-02-27 DIAGNOSIS — Z792 Long term (current) use of antibiotics: Secondary | ICD-10-CM | POA: Diagnosis not present

## 2023-03-02 DIAGNOSIS — M25561 Pain in right knee: Secondary | ICD-10-CM | POA: Diagnosis not present

## 2023-03-13 DIAGNOSIS — Z436 Encounter for attention to other artificial openings of urinary tract: Secondary | ICD-10-CM | POA: Diagnosis not present

## 2023-03-13 DIAGNOSIS — N368 Other specified disorders of urethra: Secondary | ICD-10-CM | POA: Diagnosis not present

## 2023-03-13 DIAGNOSIS — Z466 Encounter for fitting and adjustment of urinary device: Secondary | ICD-10-CM | POA: Diagnosis not present

## 2023-03-13 DIAGNOSIS — N135 Crossing vessel and stricture of ureter without hydronephrosis: Secondary | ICD-10-CM | POA: Diagnosis not present

## 2023-03-22 DIAGNOSIS — Z5181 Encounter for therapeutic drug level monitoring: Secondary | ICD-10-CM | POA: Diagnosis not present

## 2023-03-22 DIAGNOSIS — D849 Immunodeficiency, unspecified: Secondary | ICD-10-CM | POA: Diagnosis not present

## 2023-03-22 DIAGNOSIS — Z940 Kidney transplant status: Secondary | ICD-10-CM | POA: Diagnosis not present

## 2023-03-27 DIAGNOSIS — Z940 Kidney transplant status: Secondary | ICD-10-CM | POA: Diagnosis not present

## 2023-03-27 DIAGNOSIS — D849 Immunodeficiency, unspecified: Secondary | ICD-10-CM | POA: Diagnosis not present

## 2023-03-28 DIAGNOSIS — D84821 Immunodeficiency due to drugs: Secondary | ICD-10-CM | POA: Diagnosis not present

## 2023-03-28 DIAGNOSIS — D631 Anemia in chronic kidney disease: Secondary | ICD-10-CM | POA: Diagnosis not present

## 2023-03-28 DIAGNOSIS — D849 Immunodeficiency, unspecified: Secondary | ICD-10-CM | POA: Diagnosis not present

## 2023-03-28 DIAGNOSIS — N135 Crossing vessel and stricture of ureter without hydronephrosis: Secondary | ICD-10-CM | POA: Diagnosis not present

## 2023-03-28 DIAGNOSIS — T8619 Other complication of kidney transplant: Secondary | ICD-10-CM | POA: Diagnosis not present

## 2023-03-28 DIAGNOSIS — E876 Hypokalemia: Secondary | ICD-10-CM | POA: Diagnosis not present

## 2023-03-28 DIAGNOSIS — D509 Iron deficiency anemia, unspecified: Secondary | ICD-10-CM | POA: Diagnosis not present

## 2023-03-28 DIAGNOSIS — Z79624 Long term (current) use of inhibitors of nucleotide synthesis: Secondary | ICD-10-CM | POA: Diagnosis not present

## 2023-03-28 DIAGNOSIS — D638 Anemia in other chronic diseases classified elsewhere: Secondary | ICD-10-CM | POA: Diagnosis not present

## 2023-03-28 DIAGNOSIS — D72819 Decreased white blood cell count, unspecified: Secondary | ICD-10-CM | POA: Diagnosis not present

## 2023-03-28 DIAGNOSIS — Z7969 Long term (current) use of other immunomodulators and immunosuppressants: Secondary | ICD-10-CM | POA: Diagnosis not present

## 2023-03-28 DIAGNOSIS — Z8673 Personal history of transient ischemic attack (TIA), and cerebral infarction without residual deficits: Secondary | ICD-10-CM | POA: Diagnosis not present

## 2023-03-28 DIAGNOSIS — N189 Chronic kidney disease, unspecified: Secondary | ICD-10-CM | POA: Diagnosis not present

## 2023-03-28 DIAGNOSIS — Z79899 Other long term (current) drug therapy: Secondary | ICD-10-CM | POA: Diagnosis not present

## 2023-03-28 DIAGNOSIS — Z940 Kidney transplant status: Secondary | ICD-10-CM | POA: Diagnosis not present

## 2023-03-28 DIAGNOSIS — E878 Other disorders of electrolyte and fluid balance, not elsewhere classified: Secondary | ICD-10-CM | POA: Diagnosis not present

## 2023-03-28 DIAGNOSIS — Z792 Long term (current) use of antibiotics: Secondary | ICD-10-CM | POA: Diagnosis not present

## 2023-03-28 DIAGNOSIS — R739 Hyperglycemia, unspecified: Secondary | ICD-10-CM | POA: Diagnosis not present

## 2023-03-28 DIAGNOSIS — Z4822 Encounter for aftercare following kidney transplant: Secondary | ICD-10-CM | POA: Diagnosis not present

## 2023-04-04 ENCOUNTER — Ambulatory Visit (HOSPITAL_BASED_OUTPATIENT_CLINIC_OR_DEPARTMENT_OTHER): Admitting: Student

## 2023-04-04 ENCOUNTER — Telehealth (HOSPITAL_BASED_OUTPATIENT_CLINIC_OR_DEPARTMENT_OTHER): Payer: Self-pay

## 2023-04-04 NOTE — Telephone Encounter (Signed)
 Please get auth for gel injection

## 2023-04-04 NOTE — Telephone Encounter (Signed)
 I called and talked to the pt. He wants to know if you think he should try the Gel injections Vs getting a MRI. Please advise

## 2023-04-06 DIAGNOSIS — D509 Iron deficiency anemia, unspecified: Secondary | ICD-10-CM | POA: Diagnosis not present

## 2023-04-06 DIAGNOSIS — N261 Atrophy of kidney (terminal): Secondary | ICD-10-CM | POA: Diagnosis not present

## 2023-04-06 DIAGNOSIS — R918 Other nonspecific abnormal finding of lung field: Secondary | ICD-10-CM | POA: Diagnosis not present

## 2023-04-06 DIAGNOSIS — T8613 Kidney transplant infection: Secondary | ICD-10-CM | POA: Diagnosis not present

## 2023-04-06 DIAGNOSIS — R6521 Severe sepsis with septic shock: Secondary | ICD-10-CM | POA: Diagnosis not present

## 2023-04-06 DIAGNOSIS — I5022 Chronic systolic (congestive) heart failure: Secondary | ICD-10-CM | POA: Diagnosis not present

## 2023-04-06 DIAGNOSIS — Z466 Encounter for fitting and adjustment of urinary device: Secondary | ICD-10-CM | POA: Diagnosis not present

## 2023-04-06 DIAGNOSIS — D631 Anemia in chronic kidney disease: Secondary | ICD-10-CM | POA: Diagnosis not present

## 2023-04-06 DIAGNOSIS — J4489 Other specified chronic obstructive pulmonary disease: Secondary | ICD-10-CM | POA: Diagnosis not present

## 2023-04-06 DIAGNOSIS — R5081 Fever presenting with conditions classified elsewhere: Secondary | ICD-10-CM | POA: Diagnosis not present

## 2023-04-06 DIAGNOSIS — N135 Crossing vessel and stricture of ureter without hydronephrosis: Secondary | ICD-10-CM | POA: Diagnosis not present

## 2023-04-06 DIAGNOSIS — R7309 Other abnormal glucose: Secondary | ICD-10-CM | POA: Diagnosis not present

## 2023-04-06 DIAGNOSIS — R7881 Bacteremia: Secondary | ICD-10-CM | POA: Diagnosis not present

## 2023-04-06 DIAGNOSIS — R188 Other ascites: Secondary | ICD-10-CM | POA: Diagnosis not present

## 2023-04-06 DIAGNOSIS — I9589 Other hypotension: Secondary | ICD-10-CM | POA: Diagnosis not present

## 2023-04-06 DIAGNOSIS — E538 Deficiency of other specified B group vitamins: Secondary | ICD-10-CM | POA: Diagnosis not present

## 2023-04-06 DIAGNOSIS — R509 Fever, unspecified: Secondary | ICD-10-CM | POA: Diagnosis present

## 2023-04-06 DIAGNOSIS — T8619 Other complication of kidney transplant: Secondary | ICD-10-CM | POA: Diagnosis not present

## 2023-04-06 DIAGNOSIS — R0602 Shortness of breath: Secondary | ICD-10-CM | POA: Diagnosis not present

## 2023-04-06 DIAGNOSIS — Z960 Presence of urogenital implants: Secondary | ICD-10-CM | POA: Diagnosis not present

## 2023-04-06 DIAGNOSIS — Z79899 Other long term (current) drug therapy: Secondary | ICD-10-CM | POA: Diagnosis not present

## 2023-04-06 DIAGNOSIS — I959 Hypotension, unspecified: Secondary | ICD-10-CM | POA: Diagnosis not present

## 2023-04-06 DIAGNOSIS — A419 Sepsis, unspecified organism: Secondary | ICD-10-CM | POA: Diagnosis not present

## 2023-04-06 DIAGNOSIS — E871 Hypo-osmolality and hyponatremia: Secondary | ICD-10-CM | POA: Diagnosis not present

## 2023-04-06 DIAGNOSIS — Z7982 Long term (current) use of aspirin: Secondary | ICD-10-CM | POA: Diagnosis not present

## 2023-04-06 DIAGNOSIS — I471 Supraventricular tachycardia, unspecified: Secondary | ICD-10-CM | POA: Diagnosis not present

## 2023-04-06 DIAGNOSIS — I4891 Unspecified atrial fibrillation: Secondary | ICD-10-CM | POA: Diagnosis not present

## 2023-04-06 DIAGNOSIS — D84821 Immunodeficiency due to drugs: Secondary | ICD-10-CM | POA: Diagnosis not present

## 2023-04-06 DIAGNOSIS — E8809 Other disorders of plasma-protein metabolism, not elsewhere classified: Secondary | ICD-10-CM | POA: Diagnosis not present

## 2023-04-06 DIAGNOSIS — K219 Gastro-esophageal reflux disease without esophagitis: Secondary | ICD-10-CM | POA: Diagnosis not present

## 2023-04-06 DIAGNOSIS — E875 Hyperkalemia: Secondary | ICD-10-CM | POA: Diagnosis not present

## 2023-04-06 DIAGNOSIS — R059 Cough, unspecified: Secondary | ICD-10-CM | POA: Diagnosis not present

## 2023-04-06 DIAGNOSIS — D849 Immunodeficiency, unspecified: Secondary | ICD-10-CM | POA: Diagnosis not present

## 2023-04-06 DIAGNOSIS — Z936 Other artificial openings of urinary tract status: Secondary | ICD-10-CM | POA: Diagnosis not present

## 2023-04-06 DIAGNOSIS — Z9889 Other specified postprocedural states: Secondary | ICD-10-CM | POA: Diagnosis not present

## 2023-04-06 DIAGNOSIS — I429 Cardiomyopathy, unspecified: Secondary | ICD-10-CM | POA: Diagnosis not present

## 2023-04-06 DIAGNOSIS — D709 Neutropenia, unspecified: Secondary | ICD-10-CM | POA: Diagnosis not present

## 2023-04-06 DIAGNOSIS — I10 Essential (primary) hypertension: Secondary | ICD-10-CM | POA: Diagnosis not present

## 2023-04-06 DIAGNOSIS — B370 Candidal stomatitis: Secondary | ICD-10-CM | POA: Diagnosis not present

## 2023-04-06 DIAGNOSIS — Z940 Kidney transplant status: Secondary | ICD-10-CM | POA: Diagnosis not present

## 2023-04-06 DIAGNOSIS — N390 Urinary tract infection, site not specified: Secondary | ICD-10-CM | POA: Diagnosis not present

## 2023-04-07 ENCOUNTER — Emergency Department (HOSPITAL_BASED_OUTPATIENT_CLINIC_OR_DEPARTMENT_OTHER)
Admission: EM | Admit: 2023-04-07 | Discharge: 2023-04-07 | Disposition: A | Discharge status: Home / Self Care | Attending: Emergency Medicine | Admitting: Emergency Medicine

## 2023-04-07 ENCOUNTER — Emergency Department (HOSPITAL_BASED_OUTPATIENT_CLINIC_OR_DEPARTMENT_OTHER)

## 2023-04-07 ENCOUNTER — Other Ambulatory Visit: Payer: Self-pay

## 2023-04-07 ENCOUNTER — Encounter (HOSPITAL_BASED_OUTPATIENT_CLINIC_OR_DEPARTMENT_OTHER): Payer: Self-pay

## 2023-04-07 DIAGNOSIS — I10 Essential (primary) hypertension: Secondary | ICD-10-CM | POA: Diagnosis not present

## 2023-04-07 DIAGNOSIS — Z7982 Long term (current) use of aspirin: Secondary | ICD-10-CM | POA: Insufficient documentation

## 2023-04-07 DIAGNOSIS — Z79899 Other long term (current) drug therapy: Secondary | ICD-10-CM | POA: Insufficient documentation

## 2023-04-07 DIAGNOSIS — D709 Neutropenia, unspecified: Secondary | ICD-10-CM | POA: Insufficient documentation

## 2023-04-07 DIAGNOSIS — R059 Cough, unspecified: Secondary | ICD-10-CM | POA: Diagnosis not present

## 2023-04-07 DIAGNOSIS — R509 Fever, unspecified: Secondary | ICD-10-CM | POA: Diagnosis not present

## 2023-04-07 DIAGNOSIS — R5081 Fever presenting with conditions classified elsewhere: Secondary | ICD-10-CM | POA: Insufficient documentation

## 2023-04-07 DIAGNOSIS — R0602 Shortness of breath: Secondary | ICD-10-CM | POA: Insufficient documentation

## 2023-04-07 DIAGNOSIS — R7309 Other abnormal glucose: Secondary | ICD-10-CM | POA: Insufficient documentation

## 2023-04-07 HISTORY — DX: Kidney transplant status: Z94.0

## 2023-04-07 LAB — URINALYSIS, W/ REFLEX TO CULTURE (INFECTION SUSPECTED)
Bilirubin Urine: NEGATIVE
Glucose, UA: NEGATIVE mg/dL
Ketones, ur: NEGATIVE mg/dL
Nitrite: POSITIVE — AB
Protein, ur: >=300 mg/dL — AB
Specific Gravity, Urine: 1.02 (ref 1.005–1.030)
pH: 5.5 (ref 5.0–8.0)

## 2023-04-07 LAB — CBC WITH DIFFERENTIAL/PLATELET
Abs Immature Granulocytes: 0.31 10*3/uL — ABNORMAL HIGH (ref 0.00–0.07)
Basophils Absolute: 0 10*3/uL (ref 0.0–0.1)
Basophils Relative: 2 %
Eosinophils Absolute: 0 10*3/uL (ref 0.0–0.5)
Eosinophils Relative: 1 %
HCT: 27.6 % — ABNORMAL LOW (ref 39.0–52.0)
Hemoglobin: 8.4 g/dL — ABNORMAL LOW (ref 13.0–17.0)
Immature Granulocytes: 37 %
Lymphocytes Relative: 10 %
Lymphs Abs: 0.1 10*3/uL — ABNORMAL LOW (ref 0.7–4.0)
MCH: 25.5 pg — ABNORMAL LOW (ref 26.0–34.0)
MCHC: 30.4 g/dL (ref 30.0–36.0)
MCV: 83.6 fL (ref 80.0–100.0)
Monocytes Absolute: 0.2 10*3/uL (ref 0.1–1.0)
Monocytes Relative: 22 %
Neutro Abs: 0.2 10*3/uL — CL (ref 1.7–7.7)
Neutrophils Relative %: 28 %
Platelets: 409 10*3/uL — ABNORMAL HIGH (ref 150–400)
RBC: 3.3 MIL/uL — ABNORMAL LOW (ref 4.22–5.81)
RDW: 18 % — ABNORMAL HIGH (ref 11.5–15.5)
Smear Review: NORMAL
WBC: 0.8 10*3/uL — CL (ref 4.0–10.5)
nRBC: 2.4 % — ABNORMAL HIGH (ref 0.0–0.2)

## 2023-04-07 LAB — RESP PANEL BY RT-PCR (RSV, FLU A&B, COVID)  RVPGX2
Influenza A by PCR: NEGATIVE
Influenza B by PCR: NEGATIVE
Resp Syncytial Virus by PCR: NEGATIVE
SARS Coronavirus 2 by RT PCR: NEGATIVE

## 2023-04-07 LAB — COMPREHENSIVE METABOLIC PANEL WITH GFR
ALT: 7 U/L (ref 0–44)
AST: 12 U/L — ABNORMAL LOW (ref 15–41)
Albumin: 3.1 g/dL — ABNORMAL LOW (ref 3.5–5.0)
Alkaline Phosphatase: 500 U/L — ABNORMAL HIGH (ref 38–126)
Anion gap: 10 (ref 5–15)
BUN: 31 mg/dL — ABNORMAL HIGH (ref 6–20)
CO2: 18 mmol/L — ABNORMAL LOW (ref 22–32)
Calcium: 9.1 mg/dL (ref 8.9–10.3)
Chloride: 101 mmol/L (ref 98–111)
Creatinine, Ser: 2.34 mg/dL — ABNORMAL HIGH (ref 0.61–1.24)
GFR, Estimated: 32 mL/min — ABNORMAL LOW (ref >60)
Glucose, Bld: 113 mg/dL — ABNORMAL HIGH (ref 70–99)
Potassium: 4.7 mmol/L (ref 3.5–5.1)
Sodium: 129 mmol/L — ABNORMAL LOW (ref 135–145)
Total Bilirubin: 0.7 mg/dL (ref 0.0–1.2)
Total Protein: 7 g/dL (ref 6.5–8.1)

## 2023-04-07 LAB — LACTIC ACID, PLASMA: Lactic Acid, Venous: 1.2 mmol/L (ref 0.5–1.9)

## 2023-04-07 LAB — PROTIME-INR
INR: 1.2 (ref 0.8–1.2)
Prothrombin Time: 15.8 s — ABNORMAL HIGH (ref 11.4–15.2)

## 2023-04-07 LAB — TROPONIN I (HIGH SENSITIVITY): Troponin I (High Sensitivity): 14 ng/L (ref <18)

## 2023-04-07 LAB — CBG MONITORING, ED: Glucose-Capillary: 88 mg/dL (ref 70–99)

## 2023-04-07 MED ORDER — ALBUTEROL SULFATE (2.5 MG/3ML) 0.083% IN NEBU
2.5000 mg | INHALATION_SOLUTION | Freq: Once | RESPIRATORY_TRACT | Status: AC
  Administered 2023-04-07: 2.5 mg via RESPIRATORY_TRACT
  Filled 2023-04-07: qty 3

## 2023-04-07 MED ORDER — LACTATED RINGERS IV SOLN: INTRAVENOUS | Status: DC

## 2023-04-07 MED ORDER — SODIUM CHLORIDE 0.9 % IV SOLN
2.0000 g | Freq: Once | INTRAVENOUS | Status: AC
  Administered 2023-04-07: 2 g via INTRAVENOUS
  Filled 2023-04-07: qty 12.5

## 2023-04-07 MED ORDER — VANCOMYCIN HCL IN DEXTROSE 1-5 GM/200ML-% IV SOLN
1000.0000 mg | Freq: Once | INTRAVENOUS | Status: AC
  Administered 2023-04-07: 1000 mg via INTRAVENOUS
  Filled 2023-04-07: qty 200

## 2023-04-07 MED ORDER — SODIUM CHLORIDE 0.9 % IV BOLUS
500.0000 mL | Freq: Once | INTRAVENOUS | Status: AC
  Administered 2023-04-07: 500 mL via INTRAVENOUS

## 2023-04-07 MED ORDER — VANCOMYCIN HCL 500 MG IV SOLR
500.0000 mg | Freq: Once | INTRAVENOUS | Status: AC
  Administered 2023-04-07: 500 mg via INTRAVENOUS

## 2023-04-07 MED ORDER — ACETAMINOPHEN 325 MG PO TABS
650.0000 mg | ORAL_TABLET | ORAL | Status: DC | PRN
  Administered 2023-04-07 (×2): 650 mg via ORAL
  Filled 2023-04-07 (×2): qty 2

## 2023-04-07 NOTE — ED Notes (Signed)
 Patient states he did not bring his home medications, took his morning meds, however takes his cellcept and melatonin at 2100. States if he is still here he can have his wife pick up meds from home. Offered to patient to have meds brought over from [facility] if necessary.

## 2023-04-07 NOTE — ED Provider Notes (Addendum)
  Physical Exam  BP (!) 145/117   Pulse (!) 55   Temp 99.3 F (37.4 C) (Oral)   Resp (!) 32   Ht 5\' 9"  (1.753 m)   Wt 77.1 kg   SpO2 100%   BMI 25.10 kg/m   Physical Exam  Procedures  Procedures  ED Course / MDM    Medical Decision Making Amount and/or Complexity of Data Reviewed Labs: ordered. Radiology: ordered.  Risk OTC drugs. Prescription drug management.   Pending transfer to [facility].  Has become somewhat more dyspneic.  Patient states he has at "50%.  States really has not worsened recently however.  States he felt a little better with the breathing treatment.  Will continue to monitor.       Benjiman Core, MD 04/07/23 1706   Accepted.  Reevaluated.   Benjiman Core, MD 04/07/23 2158

## 2023-04-07 NOTE — Progress Notes (Signed)
 ED Pharmacy Antibiotic Sign Off An antibiotic consult was received from an ED provider for vancomycin and cefepime per pharmacy dosing for sepsis. A chart review was completed to assess appropriateness.   The following one time order(s) were placed:  Vancomycin 1500 mg IV x 1 Cefepime 2g IV x 1  Further antibiotic and/or antibiotic pharmacy consults should be ordered by the admitting provider if indicated.   Thank you for allowing pharmacy to be a part of this patient's care.   Daylene Posey, [facility]  Clinical Pharmacist 04/07/23 10:46 AM

## 2023-04-07 NOTE — ED Notes (Addendum)
 Patient has 500 cc of urine output from nephrostomy tube

## 2023-04-07 NOTE — ED Notes (Signed)
 Patient transported to X-ray

## 2023-04-07 NOTE — ED Notes (Signed)
 [facility] called for transport.

## 2023-04-07 NOTE — ED Notes (Signed)
 ED Provider at bedside.

## 2023-04-07 NOTE — ED Notes (Signed)
 Received call from bed placement at [facility] Updated VS given.

## 2023-04-07 NOTE — ED Provider Notes (Signed)
  EMERGENCY DEPARTMENT AT [facility] Provider Note   CSN: [id number] Arrival date & time: 04/07/23  4098     History  Chief Complaint  Patient presents with   flu like symptoms    Vernon Blackburn is a 58 y.o. male.  HPI 58 year old male with a history of a renal transplant last year presents with fever and flulike symptoms.  He has been feeling ill since 4/1.  He had fever, cough, body aches.  He is also having some right sided chest pain and shortness of breath.  He took some Tylenol around 8:30 AM.  He denies any urinary symptoms, abdominal pain, vomiting/diarrhea.  Temperature today was higher than it has been at 103.  Home Medications Prior to Admission medications   Medication Sig Start Date End Date Taking? Authorizing Provider  Albuterol Sulfate (PROAIR RESPICLICK) 108 (90 Base) MCG/ACT AEPB Inhale 2 puffs into the lungs every 6 (six) hours as needed (wheezing/shortness of breath).    [provider]  aspirin EC 81 MG tablet Take 81 mg by mouth daily. Swallow whole.    [provider]  betamethasone 0.5% cream-vitamin a&d ointment 1:1 mixture Apply topically.    [provider]  calcium acetate (PHOSLO) 667 MG capsule Take 2,668 mg by mouth 3 (three) times daily with meals. 11/22/19   [provider]  lactulose (CHRONULAC) 10 GM/15ML solution Take 10 g by mouth 2 (two) times daily as needed for mild constipation.    [provider]  midodrine (PROAMATINE) 10 MG tablet Take 1.5 tablets (15 mg total) by mouth every Monday, Wednesday, and Friday with hemodialysis. 09/06/22   Laurey Morale, MD  polyethylene glycol (MIRALAX / GLYCOLAX) 17 g packet Take 17 g by mouth every other day.    [provider]  VYVANSE 30 MG capsule Take 30 mg by mouth every morning. 06/21/19   [provider]      Allergies    Chlorhexidine and Isosorbide    Review of Systems   Review of Systems  Constitutional:   Positive for fever.  HENT:  Negative for sore throat.   Respiratory:  Positive for cough and shortness of breath.   Cardiovascular:  Positive for chest pain.  Gastrointestinal:  Negative for abdominal pain, diarrhea and vomiting.  Musculoskeletal:  Positive for myalgias.    Physical Exam Updated Vital Signs BP 113/78   Pulse 64   Temp 100.2 F (37.9 C)   Resp (!) 26   Ht 5\' 9"  (1.753 m)   Wt 77.1 kg   SpO2 (!) 78%   BMI 25.10 kg/m  Physical Exam Vitals and nursing note reviewed.  Constitutional:      Appearance: He is well-developed. He is not diaphoretic.  HENT:     Head: Normocephalic and atraumatic.  Cardiovascular:     Rate and Rhythm: Normal rate and regular rhythm.     Heart sounds: Normal heart sounds.  Pulmonary:     Effort: Pulmonary effort is normal.     Breath sounds: Normal breath sounds. No wheezing.  Abdominal:     General: There is no distension.     Palpations: Abdomen is soft.     Tenderness: There is no abdominal tenderness.  Skin:    General: Skin is warm and dry.  Neurological:     Mental Status: He is alert.     ED Results / Procedures / Treatments   Labs (all labs ordered are listed, but only abnormal results  are displayed) Labs Reviewed  COMPREHENSIVE METABOLIC PANEL WITH GFR - Abnormal; Notable for the following components:      Result Value   Sodium 129 (*)    CO2 18 (*)    Glucose, Bld 113 (*)    BUN 31 (*)    Creatinine, Ser 2.34 (*)    Albumin 3.1 (*)    AST 12 (*)    Alkaline Phosphatase 500 (*)    GFR, Estimated 32 (*)    All other components within normal limits  CBC WITH DIFFERENTIAL/PLATELET - Abnormal; Notable for the following components:   WBC 0.8 (*)    RBC 3.30 (*)    Hemoglobin 8.4 (*)    HCT 27.6 (*)    MCH 25.5 (*)    RDW 18.0 (*)    Platelets 409 (*)    nRBC 2.4 (*)    Neutro Abs 0.2 (*)    Lymphs Abs 0.1 (*)    Abs Immature Granulocytes 0.31 (*)    All other components within normal limits  PROTIME-INR  - Abnormal; Notable for the following components:   Prothrombin Time 15.8 (*)    All other components within normal limits  URINALYSIS, W/ REFLEX TO CULTURE (INFECTION SUSPECTED) - Abnormal; Notable for the following components:   APPearance HAZY (*)    Hgb urine dipstick LARGE (*)    Protein, ur >=300 (*)    Nitrite POSITIVE (*)    Leukocytes,Ua SMALL (*)    Bacteria, UA MANY (*)    All other components within normal limits  RESP PANEL BY RT-PCR (RSV, FLU A&B, COVID)  RVPGX2  CULTURE, BLOOD (ROUTINE X 2)  CULTURE, BLOOD (ROUTINE X 2)  URINE CULTURE  LACTIC ACID, PLASMA  URINALYSIS, ROUTINE W REFLEX MICROSCOPIC  CBG MONITORING, ED  TROPONIN I (HIGH SENSITIVITY)  TROPONIN I (HIGH SENSITIVITY)    EKG EKG Interpretation Date/Time:  Saturday April 07 2023 09:17:14 EDT Ventricular Rate:  99 PR Interval:  168 QRS Duration:  84 QT Interval:  339 QTC Calculation: 435 R Axis:   68  Text Interpretation: Sinus rhythm no acute ST/T changes limited evaluation of AVL and AVF Confirmed by Pricilla Loveless [phone]) on 04/07/2023 9:23:27 AM  Radiology DG Chest 2 View Result Date: 04/07/2023 CLINICAL DATA:  Cough and fever. EXAM: CHEST - 2 VIEW COMPARISON:  09/06/2022 FINDINGS: The heart size and mediastinal contours are within normal limits. Prior mitral valve repair noted. Both lungs are clear. The visualized skeletal structures are unremarkable. IMPRESSION: No active cardiopulmonary disease. Electronically Signed   By: Danae Orleans M.D.   On: 04/07/2023 11:09    Procedures .Critical Care  Performed by: Pricilla Loveless, MD Authorized by: Pricilla Loveless, MD   Critical care provider statement:    Critical care time (minutes):  35   Critical care time was exclusive of:  Separately billable procedures and treating other patients   Critical care was necessary to treat or prevent imminent or life-threatening deterioration of the following conditions:  Sepsis and renal failure   Critical care  was time spent personally by me on the following activities:  Development of treatment plan with patient or surrogate, discussions with consultants, evaluation of patient's response to treatment, examination of patient, ordering and review of laboratory studies, ordering and review of radiographic studies, ordering and performing treatments and interventions, pulse oximetry, re-evaluation of patient's condition and review of old charts     Medications Ordered in ED Medications  lactated ringers infusion ( Intravenous New Bag/Given  04/07/23 1140)  acetaminophen (TYLENOL) tablet 650 mg (650 mg Oral Given 04/07/23 1251)  sodium chloride 0.9 % bolus 500 mL (0 mLs Intravenous Stopped 04/07/23 1134)  ceFEPIme (MAXIPIME) 2 g in sodium chloride 0.9 % 100 mL IVPB (0 g Intravenous Stopped 04/07/23 1134)  vancomycin (VANCOCIN) IVPB 1000 mg/200 mL premix (0 mg Intravenous Stopped 04/07/23 1253)    Followed by  vancomycin (VANCOCIN) 500 mg in sodium chloride 0.9 % 100 mL IVPB (0 mg Intravenous Stopped 04/07/23 1419)    ED Course/ Medical Decision Making/ A&P                                 Medical Decision Making Amount and/or Complexity of Data Reviewed External Data Reviewed: labs and notes. Labs: ordered.    Details: Neutropenia.  Negative COVID/flu/RSV Radiology: ordered and independent interpretation performed.    Details: No lobar pneumonia ECG/medicine tests: ordered and independent interpretation performed.    Details: No ischemia  Risk OTC drugs. Prescription drug management.   Patient presents with fever, unclear source.  He is found to be neutropenic and so he was given broad IV antibiotics after blood and urine cultures.  He did urinate rather than collecting the urine from his nephrostomy tube but this still could be chronically contaminated.  Either way he will get broad antibiotics.  I discussed with Dr. Lannie Fields at [facility].  He accepts in transfer.  For now just keep on supportive care and  continue the antibiotics started.  Patient did already take his CellCept and prednisone today, otherwise Dr. Rennis Harding is recommending holding the CellCept.  [facility] accepts for transfer, likely this afternoon.       Final Clinical Impression(s) / ED Diagnoses Final diagnoses:  Neutropenic fever (HCC)    Rx / DC Orders ED Discharge Orders     None         Pricilla Loveless, MD 04/07/23 1511

## 2023-04-07 NOTE — ED Notes (Signed)
 Contacted [facility] in imaging per Criss Alvine MD request to have the patients XRAY power shared to [facility]

## 2023-04-07 NOTE — ED Notes (Signed)
 Called [facility] transfer line [phone]  Still waiting on call from [facility] for bed placement Will need to call [facility] for transfer when bed ready

## 2023-04-07 NOTE — ED Triage Notes (Addendum)
 Pt reports that he has been having some SOB, fatigue, cold chills, fever and muscle aches. Pt noted to be unsteady while walking into triage. Fever noted to be 103.1 in triage. Family states that he has lost about 20 lb in the last 2 weeks. Pt has fistula in Left arm.

## 2023-04-07 NOTE — ED Notes (Signed)
 Report has been called and updated given to Jose Persia, RN

## 2023-04-07 NOTE — ED Notes (Signed)
 Call placed for bed placement at [facility] No bed available at this time [facility] will call us when bed comes available We will need to contact [facility] for transportation

## 2023-04-07 NOTE — ED Notes (Signed)
 Unable to obtain SpO2, attempted ear probe, spot check, finger spo2 monitor and sticker on ear. Pt c/o shortness of breath, requesting breathing treatment, increased work of breathing noted, RT Brett Canales notified.

## 2023-04-07 NOTE — ED Notes (Signed)
 Pt is currently coughing and its productive- able to get some secretion up - noted clear secretion at this time. Pt states feels a little better.

## 2023-04-08 ENCOUNTER — Telehealth (HOSPITAL_BASED_OUTPATIENT_CLINIC_OR_DEPARTMENT_OTHER): Payer: Self-pay | Admitting: Emergency Medicine

## 2023-04-08 LAB — BLOOD CULTURE ID PANEL (REFLEXED) - BCID2

## 2023-04-09 LAB — PATHOLOGIST SMEAR REVIEW

## 2023-04-10 LAB — URINE CULTURE: Culture: >=100000 — AB

## 2023-04-10 LAB — CULTURE, BLOOD (ROUTINE X 2): Special Requests: ADEQUATE

## 2023-04-12 LAB — CULTURE, BLOOD (ROUTINE X 2): Culture: NO GROWTH

## 2023-04-17 DIAGNOSIS — Z940 Kidney transplant status: Secondary | ICD-10-CM | POA: Diagnosis not present

## 2023-04-17 DIAGNOSIS — Z5181 Encounter for therapeutic drug level monitoring: Secondary | ICD-10-CM | POA: Diagnosis not present

## 2023-04-17 DIAGNOSIS — D849 Immunodeficiency, unspecified: Secondary | ICD-10-CM | POA: Diagnosis not present

## 2023-04-19 DIAGNOSIS — Z940 Kidney transplant status: Secondary | ICD-10-CM | POA: Diagnosis not present

## 2023-04-19 DIAGNOSIS — D638 Anemia in other chronic diseases classified elsewhere: Secondary | ICD-10-CM | POA: Diagnosis not present

## 2023-04-19 DIAGNOSIS — T8619 Other complication of kidney transplant: Secondary | ICD-10-CM | POA: Diagnosis not present

## 2023-04-19 DIAGNOSIS — N135 Crossing vessel and stricture of ureter without hydronephrosis: Secondary | ICD-10-CM | POA: Diagnosis not present

## 2023-04-19 DIAGNOSIS — D72819 Decreased white blood cell count, unspecified: Secondary | ICD-10-CM | POA: Diagnosis not present

## 2023-04-19 DIAGNOSIS — Z9889 Other specified postprocedural states: Secondary | ICD-10-CM | POA: Diagnosis not present

## 2023-04-19 DIAGNOSIS — D849 Immunodeficiency, unspecified: Secondary | ICD-10-CM | POA: Diagnosis not present

## 2023-04-19 IMAGING — US IR PARACENTESIS
1 series · 5 of 5 positions shown · non-contrast
Comparison: none

INDICATION: Patient with history of ESRD, CHF, ureteral obstruction, recent
suprapubic catheter placement, ascites. Request for therapeutic
paracentesis.

[Series 1: ir (id) (id)/(id)/(id) ir · 5 of 5 slices shown]
[im 1/5]
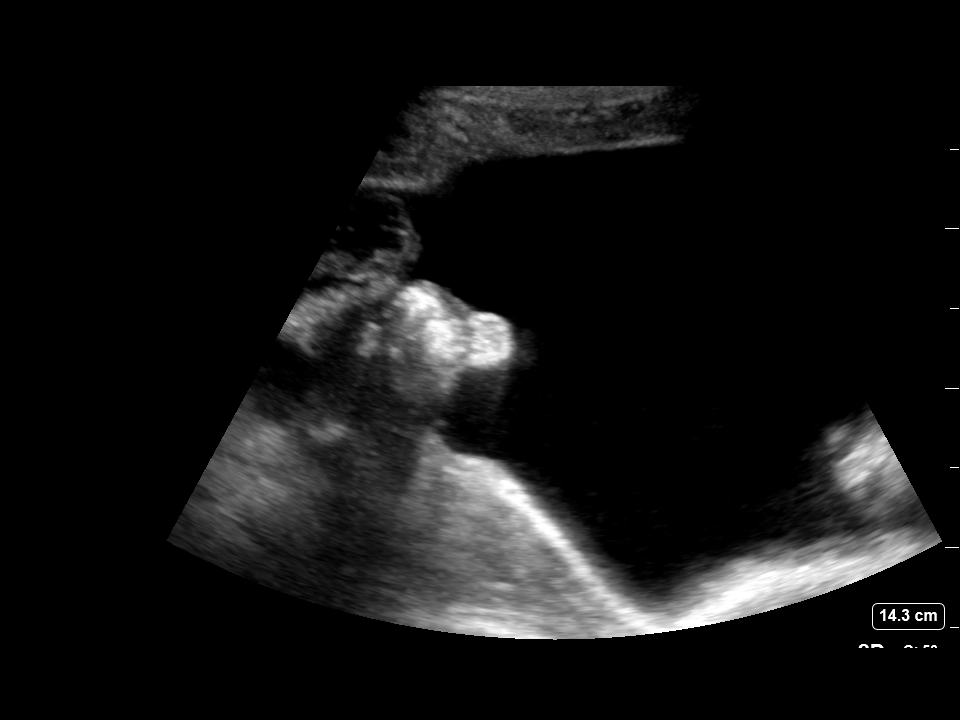
[im 2/5]
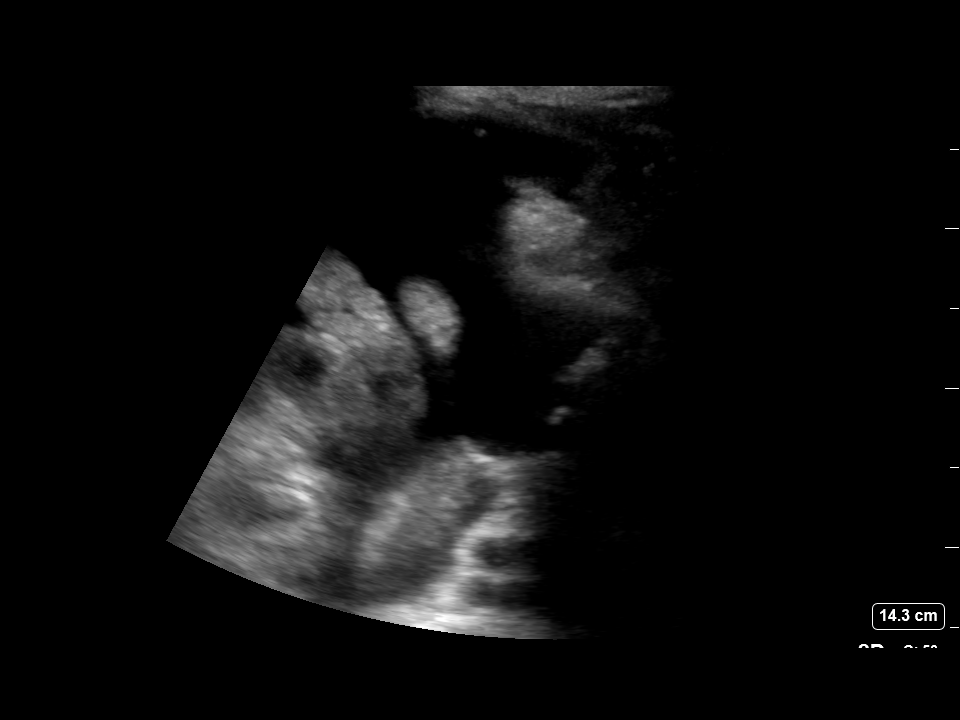
[im 3/5]
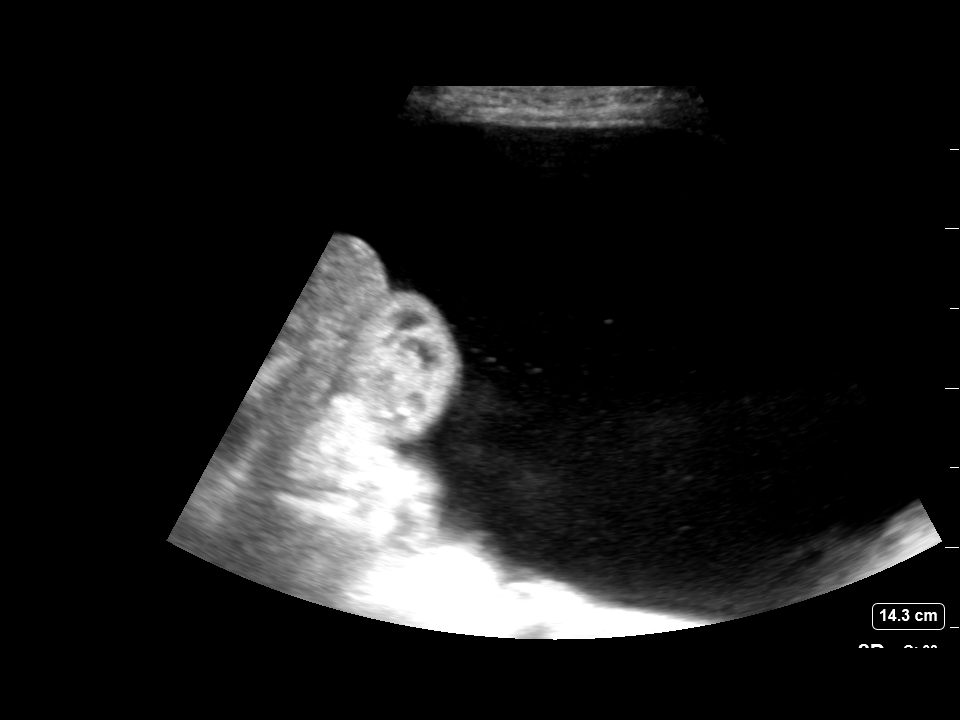
[im 4/5]
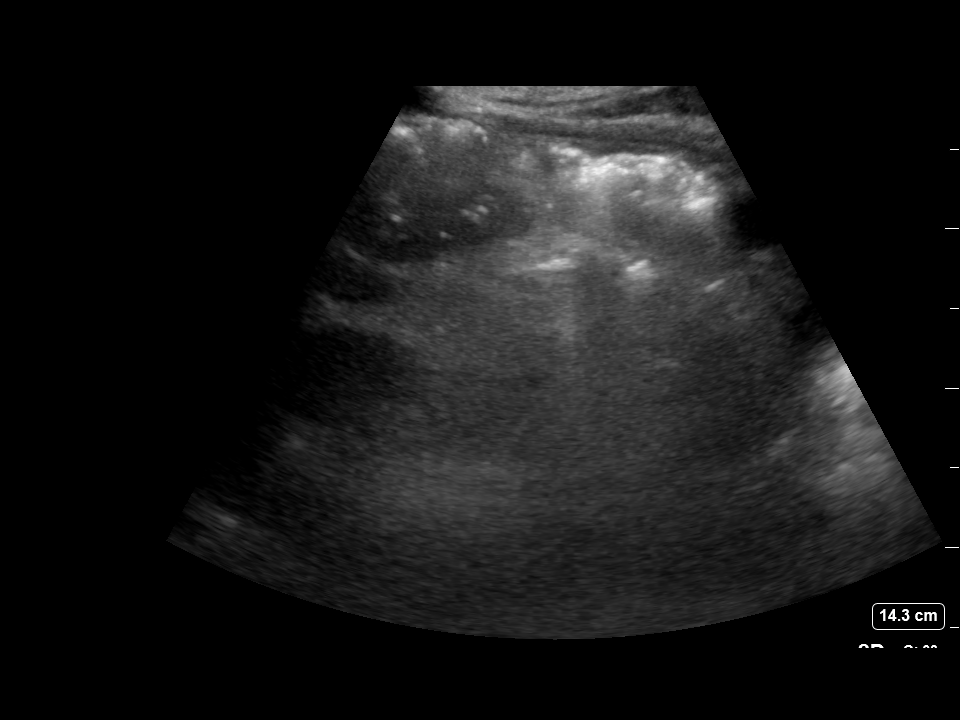
[im 5/5]
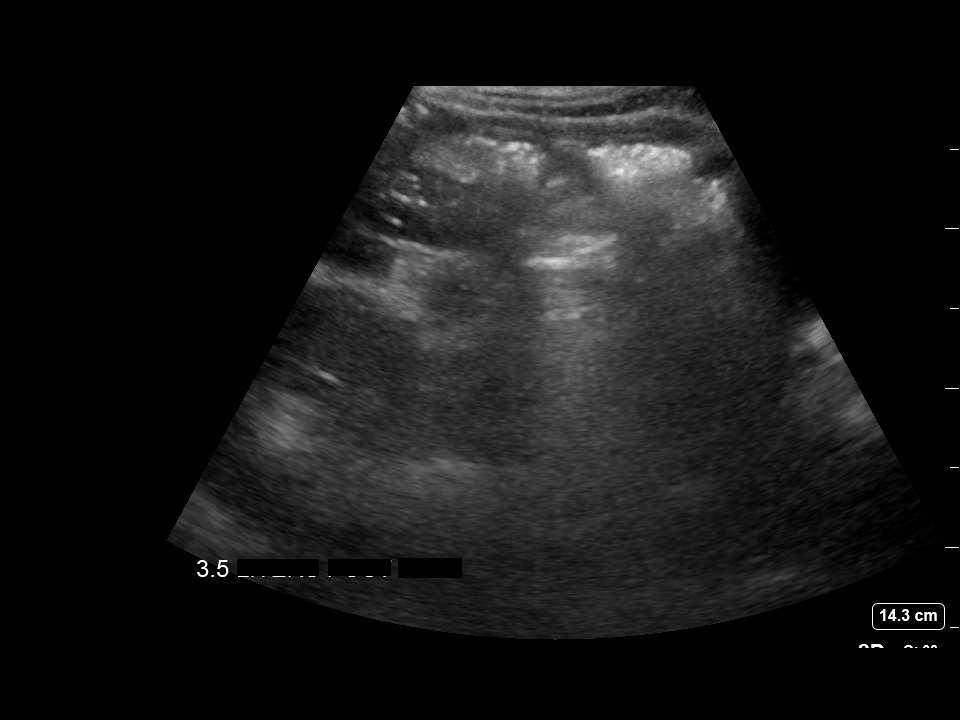

[5 of 5 positions shown; findings below may reference images not displayed]

EXAM:
ULTRASOUND GUIDED THERAPEUTIC PARACENTESIS

MEDICATIONS:
10 mL 1% lidocaine

COMPLICATIONS:
None immediate.

PROCEDURE:
Informed written consent was obtained from the patient after a
discussion of the risks, benefits and alternatives to treatment. A
timeout was performed prior to the initiation of the procedure.

Initial ultrasound scanning demonstrates a moderate amount of
ascites within the left lower abdominal quadrant. The left lower
abdomen was prepped and draped in the usual sterile fashion. 1%
lidocaine was used for local anesthesia.

Following this, a 19 gauge, 7-cm, Yueh catheter was introduced. An
ultrasound image was saved for documentation purposes. The
paracentesis was performed. The catheter was removed and a dressing
was applied. The patient tolerated the procedure well without
immediate post procedural complication.
FINDINGS: A total of approximately 3.5 L of clear, yellow fluid was removed.
IMPRESSION: Successful ultrasound-guided paracentesis yielding 3.5 liters of
peritoneal fluid.

## 2023-04-20 DIAGNOSIS — M1711 Unilateral primary osteoarthritis, right knee: Secondary | ICD-10-CM | POA: Diagnosis not present

## 2023-04-24 DIAGNOSIS — T83122D Displacement of urinary stent, subsequent encounter: Secondary | ICD-10-CM | POA: Diagnosis not present

## 2023-04-24 DIAGNOSIS — D849 Immunodeficiency, unspecified: Secondary | ICD-10-CM | POA: Diagnosis not present

## 2023-04-24 DIAGNOSIS — I4892 Unspecified atrial flutter: Secondary | ICD-10-CM | POA: Diagnosis not present

## 2023-04-24 DIAGNOSIS — R609 Edema, unspecified: Secondary | ICD-10-CM | POA: Diagnosis not present

## 2023-04-24 DIAGNOSIS — N1831 Chronic kidney disease, stage 3a: Secondary | ICD-10-CM | POA: Diagnosis not present

## 2023-04-24 DIAGNOSIS — D84821 Immunodeficiency due to drugs: Secondary | ICD-10-CM | POA: Diagnosis not present

## 2023-04-24 DIAGNOSIS — Z9884 Bariatric surgery status: Secondary | ICD-10-CM | POA: Diagnosis not present

## 2023-04-24 DIAGNOSIS — M179 Osteoarthritis of knee, unspecified: Secondary | ICD-10-CM | POA: Diagnosis not present

## 2023-04-24 DIAGNOSIS — Z960 Presence of urogenital implants: Secondary | ICD-10-CM | POA: Diagnosis not present

## 2023-04-24 DIAGNOSIS — Z940 Kidney transplant status: Secondary | ICD-10-CM | POA: Diagnosis not present

## 2023-04-24 DIAGNOSIS — I953 Hypotension of hemodialysis: Secondary | ICD-10-CM | POA: Diagnosis not present

## 2023-04-24 DIAGNOSIS — T8619 Other complication of kidney transplant: Secondary | ICD-10-CM | POA: Diagnosis not present

## 2023-04-26 NOTE — Telephone Encounter (Signed)
 VOB has been submitted for Durolane, right knee

## 2023-05-03 DIAGNOSIS — T8619 Other complication of kidney transplant: Secondary | ICD-10-CM | POA: Diagnosis not present

## 2023-05-03 DIAGNOSIS — Z79624 Long term (current) use of inhibitors of nucleotide synthesis: Secondary | ICD-10-CM | POA: Diagnosis not present

## 2023-05-03 DIAGNOSIS — D84821 Immunodeficiency due to drugs: Secondary | ICD-10-CM | POA: Diagnosis not present

## 2023-05-03 DIAGNOSIS — D849 Immunodeficiency, unspecified: Secondary | ICD-10-CM | POA: Diagnosis not present

## 2023-05-03 DIAGNOSIS — Z9884 Bariatric surgery status: Secondary | ICD-10-CM | POA: Diagnosis not present

## 2023-05-03 DIAGNOSIS — Z940 Kidney transplant status: Secondary | ICD-10-CM | POA: Diagnosis not present

## 2023-05-03 DIAGNOSIS — Z7952 Long term (current) use of systemic steroids: Secondary | ICD-10-CM | POA: Diagnosis not present

## 2023-05-03 DIAGNOSIS — I10 Essential (primary) hypertension: Secondary | ICD-10-CM | POA: Diagnosis not present

## 2023-05-03 DIAGNOSIS — E876 Hypokalemia: Secondary | ICD-10-CM | POA: Diagnosis not present

## 2023-05-03 DIAGNOSIS — N135 Crossing vessel and stricture of ureter without hydronephrosis: Secondary | ICD-10-CM | POA: Diagnosis not present

## 2023-05-03 DIAGNOSIS — I4892 Unspecified atrial flutter: Secondary | ICD-10-CM | POA: Diagnosis not present

## 2023-05-03 DIAGNOSIS — Z952 Presence of prosthetic heart valve: Secondary | ICD-10-CM | POA: Diagnosis not present

## 2023-05-03 DIAGNOSIS — R600 Localized edema: Secondary | ICD-10-CM | POA: Diagnosis not present

## 2023-05-04 DIAGNOSIS — Z940 Kidney transplant status: Secondary | ICD-10-CM | POA: Diagnosis not present

## 2023-05-04 DIAGNOSIS — N135 Crossing vessel and stricture of ureter without hydronephrosis: Secondary | ICD-10-CM | POA: Diagnosis not present

## 2023-05-04 DIAGNOSIS — N1330 Unspecified hydronephrosis: Secondary | ICD-10-CM | POA: Diagnosis not present

## 2023-05-04 DIAGNOSIS — T8619 Other complication of kidney transplant: Secondary | ICD-10-CM | POA: Diagnosis not present

## 2023-05-04 DIAGNOSIS — D638 Anemia in other chronic diseases classified elsewhere: Secondary | ICD-10-CM | POA: Diagnosis not present

## 2023-05-04 DIAGNOSIS — R188 Other ascites: Secondary | ICD-10-CM | POA: Diagnosis not present

## 2023-05-04 DIAGNOSIS — R600 Localized edema: Secondary | ICD-10-CM | POA: Diagnosis not present

## 2023-05-04 DIAGNOSIS — Z9889 Other specified postprocedural states: Secondary | ICD-10-CM | POA: Diagnosis not present

## 2023-05-04 DIAGNOSIS — D72819 Decreased white blood cell count, unspecified: Secondary | ICD-10-CM | POA: Diagnosis not present

## 2023-05-07 ENCOUNTER — Ambulatory Visit (HOSPITAL_COMMUNITY)
Admission: RE | Admit: 2023-05-07 | Discharge: 2023-05-07 | Disposition: A | Discharge status: Home / Self Care | Source: Ambulatory Visit | Attending: Physician Assistant | Admitting: Physician Assistant

## 2023-05-07 ENCOUNTER — Encounter (HOSPITAL_COMMUNITY): Payer: Self-pay

## 2023-05-07 ENCOUNTER — Telehealth (HOSPITAL_COMMUNITY): Payer: Self-pay

## 2023-05-07 ENCOUNTER — Inpatient Hospital Stay (HOSPITAL_COMMUNITY)
Admission: RE | Admit: 2023-05-07 | Discharge: 2023-05-07 | Disposition: A | Discharge status: Home / Self Care | Source: Ambulatory Visit | Attending: Internal Medicine | Admitting: Internal Medicine

## 2023-05-07 ENCOUNTER — Other Ambulatory Visit (HOSPITAL_COMMUNITY): Payer: Self-pay | Admitting: Internal Medicine

## 2023-05-07 VITALS — BP 155/110 | HR 83 | Ht 69.0 in | Wt 190.0 lb

## 2023-05-07 DIAGNOSIS — E785 Hyperlipidemia, unspecified: Secondary | ICD-10-CM | POA: Diagnosis not present

## 2023-05-07 DIAGNOSIS — R002 Palpitations: Secondary | ICD-10-CM | POA: Diagnosis not present

## 2023-05-07 DIAGNOSIS — N1339 Other hydronephrosis: Secondary | ICD-10-CM | POA: Diagnosis not present

## 2023-05-07 DIAGNOSIS — Z8673 Personal history of transient ischemic attack (TIA), and cerebral infarction without residual deficits: Secondary | ICD-10-CM | POA: Diagnosis not present

## 2023-05-07 DIAGNOSIS — R188 Other ascites: Secondary | ICD-10-CM | POA: Diagnosis not present

## 2023-05-07 DIAGNOSIS — Z940 Kidney transplant status: Secondary | ICD-10-CM | POA: Diagnosis not present

## 2023-05-07 DIAGNOSIS — I428 Other cardiomyopathies: Secondary | ICD-10-CM | POA: Insufficient documentation

## 2023-05-07 DIAGNOSIS — I5022 Chronic systolic (congestive) heart failure: Secondary | ICD-10-CM | POA: Diagnosis not present

## 2023-05-07 DIAGNOSIS — I340 Nonrheumatic mitral (valve) insufficiency: Secondary | ICD-10-CM | POA: Insufficient documentation

## 2023-05-07 DIAGNOSIS — I5032 Chronic diastolic (congestive) heart failure: Secondary | ICD-10-CM | POA: Diagnosis not present

## 2023-05-07 DIAGNOSIS — N186 End stage renal disease: Secondary | ICD-10-CM | POA: Insufficient documentation

## 2023-05-07 DIAGNOSIS — I10 Essential (primary) hypertension: Secondary | ICD-10-CM | POA: Diagnosis not present

## 2023-05-07 DIAGNOSIS — Z5181 Encounter for therapeutic drug level monitoring: Secondary | ICD-10-CM | POA: Diagnosis not present

## 2023-05-07 DIAGNOSIS — I132 Hypertensive heart and chronic kidney disease with heart failure and with stage 5 chronic kidney disease, or end stage renal disease: Secondary | ICD-10-CM | POA: Diagnosis not present

## 2023-05-07 DIAGNOSIS — I130 Hypertensive heart and chronic kidney disease with heart failure and stage 1 through stage 4 chronic kidney disease, or unspecified chronic kidney disease: Secondary | ICD-10-CM | POA: Diagnosis present

## 2023-05-07 DIAGNOSIS — D849 Immunodeficiency, unspecified: Secondary | ICD-10-CM | POA: Diagnosis not present

## 2023-05-07 NOTE — Telephone Encounter (Signed)
 Per Gerilyn Kobus- PA   After speaking with transplant Nephrologist at [facility]  Advises that patient increase Furosemide  to 40mg  twice daily, Add Potassium 40meq once daily  And recheck BMP in one week.   Attempted to call patient, left message for patient to call back to office.

## 2023-05-07 NOTE — Progress Notes (Signed)
 Zio patch placed onto patient.  All instructions and information reviewed with patient, they verbalize understanding with no questions.

## 2023-05-07 NOTE — Progress Notes (Addendum)
 Heart Failure Clinic Note  PCP:  Adrian Hopper, MD  Cardiologist:  Dr. Mitzie Anda   History of Present Illness: Vernon Blackburn is a 58 y.o. male who has a history of long-standing, poorly-controlled HTN, CKD stage 3-4, chronic systolic CHF and severe mitral regurgitation.  He has had hypertension for years.  He has had known CKD, followed by [facility].  He developed severe dyspnea in 12/17 and was admitted with acute systolic CHF.  Echo showed EF 20-25% with severe MR.  He was diuresed and discharged.   TEE was done in 5/18 to assess mitral valve.  There was severe MR.  The MV was thickened and did not coapt well.  Suspect there was a component of secondary MR with moderate LV dilation, however the thickened leaflets suggested a component of primary MR as well.   He had right and left heart cath in 6/18.  No significant CAD, elevated filling pressures.    In 6/18, he had mitral valve repair.  Post-op echo in 7/18 showed EF 25-30% with stable MV repair (mild MR, mean gradient 4 mmHg).  Echo 12/18 with EF 30-35%, moderate LV dilation, s/p MVR repair with trivial MR.  Echo in 4/19 showed EF up to 40-45% with stable repaired mitral valve.   He had an echo in 8/20 with EF 35%, mild LV dilation, s/p MV repair with mean gradient 4 mmHg and trivial MR, mildly decreased RV systolic function.   He was admitted in 2/21 to [facility] with worsening renal function and HD was started.  Echo at [facility] in 2/21 showed EF 40-45%, mild LV dilation, moderate LVH, "severe mitral stenosis" with mean gradient 7 mmHg and eccentric mild MR.  Head imagining showed an old CVA.   TEE was done to followup on ?severe mitral stenosis in 3/21.  This showed EF 45%, moderate LVH, global mild hypokinesis, mildly decreased RV systolic function; s/p MV repair, mild MR with mean gradient 4 mmHg, no significant mitral stenosis.  CPX in 11/21 showed peak VO2 17.7 (51% predicted), RER 1.12, VE/VCO2 slope 29.  Moderate functional limitation due to  mixed restrictive/obstructive lung disease.  Echo in 7/22 showed EF 50-55%, s/p MV repair with trivial MR and mean gradient 5 mmHg.   Patient was admitted in 7/22 with abdominal distention and found to have ascites, cause still uncertain (unless simply due to inadequate dialysis prescription).  No evidence for cirrhosis by CT. He had paracentesis. He additionally had TURP for enlarged prostate and has ended up with a suprapubic catheter.    RHC in 10/22 to assess RV function in the setting of ascites of uncertain etiology showed normal right and left heart filling pressures, normal PA pressure, and normal cardiac output.  He has been seen by GI and continues to require paracenteses.  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   Echo in 1/24 showed EF 50%, mild LVH, mild global hypokinesis, mildly decreased RV systolic function with mild RV enlargement, s/p mitral valve repair with mild MR.  Cardiolite in 1/24 showed no ischemia/infarction.   He underwent living donor kidney transplant in 11/24. He was admitted to [facility] in 04/25 with neutropenic fever and septic shock 2/2 Klebsiella urosepsis. Had nephrostomy tub removed April 9th. Had slow passage of dye on antegrade nephrostogram through a stenotic kinked ureter.  He is here today for CHF follow-up.  He reports a lot of swelling in his legs over the  last few months. Recently had venous dopplers that were negative for DVT. Takes 20 mg lasix  every other day. Has great urine output but swelling doesn't really seem to be improving. Can't wear his regular shoes d/t the edema. Wears thigh high compression stockings regularly. Denies dyspnea, orthopnea and PND. Uses midodrine  PRN for BP less than 120 systolic. BP 155/110 today which is unusually high for him. Drinks about 50 oz fluids daily.   Has intermittent palpitations. Had been occurring fairly frequently then stopped a couple of weeks ago.  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    PMH: 1. Gout 2. HTN: Long-standing, poor control. 3. ESRD.  Likely hypertensive nephropathy.  - Sp liver donor kidney transplant at [facility] 11/24 4. Dilated cardiomyopathy: May be due to long-standing HTN.  - Echo (1/18): EF 20-25%, severe MR - Echo (2/18): EF 25%, severe MR.  - TEE (5/18): Moderate LV dilation with severe global hypokinesis, EF 25-30%, normal RV size with mildly decreased systolic function, RV-RA gradient 55 mmHg, severe MR with mildly thickened leaflets with inadequate coaptation and ERO 0.52 cm^2 by PISA and systolic flow reversal in the pulmonary vein doppler pattern => secondary MR from dilated LV but thickened leaflets lead to concern for component of primary MR as well.  - RHC/LHC (6/18): No significant CAD.  Mean RA 15, PA 76/31 mean 48, mean PCWP 45, CI 3.02 Fick, 2.64 thermo.  - Echo (7/18): EF 25-30%, mild dilation, diffuse hypokinesis, s/p MV repair with mean gradient 4 mmHg and mild MR, PASP 37 mmHg, normal RV size and systolic function.  - Echo (12/18): EF 30-35%, moderate LV dilation with diffuse hypokinesis, stable MV repair.  - Echo (4/19): EF 40-45%, severe LV dilation, s/p mitral valve repair with mean gradient 4 mmHg, no mitral regurgitation, severe RV dilation with normal systolic function.  - Echo (8/20): EF 35%, mild LV dilation, s/p MV repair with mean gradient 4 mmHg and trivial MR, mildly decreased RV systolic function.  - Echo (2/21, [facility]):  EF 40-45%, mild LV dilation, moderate LVH, "severe mitral stenosis" with mean gradient 7 mmHg and eccentric mild MR. - TEE (3/21): EF 45%, moderate LVH, global mild hypokinesis, mildly decreased RV systolic function; s/p MV repair, mild MR with mean gradient 4 mmHg, no significant mitral stenosis.  - CPX (11/21): peak VO2 17.7 (51% predicted), RER 1.12, VE/VCO2 slope 29.  Moderate functional limitation due to mixed restrictive/obstructive lung disease.  -  Echo (7/22): EF 50-55%, s/p MV repair with trivial MR and mean gradient 5 mmHg.  - RHC (10/22): mean RA 1, PA 17/2, mean PCWP 2, CI 3.3 - Echo (1/24): EF 50%, mild LVH, mild global hypokinesis, mildly decreased RV systolic function with mild RV enlargement, s/p mitral valve repair with mild MR.  - Cardiolite (1/24): EF 45%, no ischemia/infarction.  - Echo at [facility] 11/24: EF 55%, RV okay, trivial MR 5. Severe MR: S/p MV repair in 6/18. 6. H/o CVA 7. BPH: S/p TURP, now with suprapubic catheter.  8. H/o ascites (7/22) with paracenteses.   SH: Married, lives in [place], nonsmoker, no drugs, occasional ETOH.  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   Family History  Problem Relation Age of Onset   Diabetes Mother    Hypertension Mother    Heart failure Mother    Colon cancer Neg Hx    Esophageal cancer Neg Hx    Rectal cancer Neg Hx  Stomach cancer Neg Hx    ROS: All systems reviewed and negative except as per HPI.   Current Outpatient Medications  Medication Sig Dispense Refill   acetaminophen  (TYLENOL ) 325 MG tablet Take 650 mg by mouth.     Albuterol  Sulfate (PROAIR  RESPICLICK) 108 (90 Base) MCG/ACT AEPB Inhale 2 puffs into the lungs every 6 (six) hours as needed (wheezing/shortness of breath).     aspirin  EC 81 MG tablet Take 81 mg by mouth daily. Swallow whole.     atorvastatin  (LIPITOR) 20 MG tablet Take 20 mg by mouth.     betamethasone 0.5% cream-vitamin a&d ointment 1:1 mixture Apply topically.     BREO ELLIPTA  200-25 MCG/ACT AEPB Inhale 1 puff into the lungs.     calcium  acetate (PHOSLO ) 667 MG capsule Take 2,668 mg by mouth 3 (three) times daily with meals.     carvedilol  (COREG ) 6.25 MG tablet Take 6.25 mg by mouth 2 (two) times daily.     docusate sodium  (COLACE) 50 MG capsule Take 50 mg by mouth.     famotidine  (PEPCID ) 20 MG tablet Take 20 mg by mouth 2 (two) times daily.     ferrous sulfate  325 (65 FE) MG EC tablet Take 1 tablet by mouth daily  with breakfast.     fexofenadine (ALLEGRA) 180 MG tablet Take 180 mg by mouth.     furosemide  (LASIX ) 20 MG tablet Take 20 mg by mouth every other day.     lactulose (CHRONULAC) 10 GM/15ML solution Take 10 g by mouth 2 (two) times daily as needed for mild constipation.     Melatonin 10 MG TABS Take 10 mg by mouth at bedtime.     midodrine  (PROAMATINE ) 5 MG tablet Take 5 mg by mouth as needed. For blood pressure less than 120 systolic     mycophenolate  (CELLCEPT) 250 MG capsule Take 750 mg by mouth 2 (two) times daily.     pantoprazole  (PROTONIX ) 20 MG tablet Take 20 mg by mouth daily.     polyethylene glycol (MIRALAX  / GLYCOLAX ) 17 g packet Take 17 g by mouth every other day.     predniSONE  (DELTASONE ) 5 MG tablet Take 5 mg by mouth daily.     sulfamethoxazole-trimethoprim (BACTRIM DS) 800-160 MG tablet Take 1 tablet by mouth 3 (three) times a week.     valGANciclovir (VALCYTE) 450 MG tablet Take 450 mg by mouth daily.     VYVANSE  30 MG capsule Take 30 mg by mouth every morning.     No current facility-administered medications for this encounter.   Exam:   BP (!) 155/110   Pulse 83   Ht 5\' 9"  (1.753 m)   Wt 86.2 kg (190 lb)   SpO2 97%   BMI 28.06 kg/m   General:  Well appearing.  Neck: JVP to midneck Cor: Regular rate & rhythm. No rubs, gallops or murmurs. Lungs: clear Abdomen: soft, nontender, nondistended.  Extremities: 2-3+ lower extremity edema to knees Neuro: alert & oriented x 3. Affect pleasant.   Assessment/Plan: 1. Chronic systolic CHF now with recovered EF: Echo in 7/18 with EF 25-30%. Nonischemic cardiomyopathy based on cath in 6/18.  Cause of cardiomyopathy may be long-standing, poorly controlled HTN.  NYHA class II symptoms.  He improved symptomatically s/p MV repair.  Echo in 4/19 with EF 40-45%. Echo at [facility] in 2/21 showed EF 40-45%.   TEE in 3/21 with EF 45%.  Echo in 5/22 with EF up to 50-55%.  Echo in 1/24  showed EF 50%, mild LVH, mild global hypokinesis, mildly  decreased RV systolic function with mild RV enlargement, s/p mitral valve repair with mild MR. RHC in 10/22 was normal, his ascites cannot be explained by RV failure.  Echo 11/24: EF 55%, RV okay, trivial MR. He is not volume overloaded on exam.   - NYHA II. Volume up on exam today and by ReDS which is 42%. Protein/creatinine ratio is in the nephrotic range, this may also be contributing to his edema. He is taking lasix  20 mg every other day. Discussed with his transplant coordinator, Delman Ferns, from [place]. Will increase lasix  to 40 mg BID and start 40 mEq KCL daily. Ordered BNP to be done with labs for [facility] today. Check BMET in 1 week. - May consider addition of spironolactone  next - Continue coreg  6.25 mg BID - Continue with compression stockings.  - Check echo 2. HTN: Possible hypertensive cardiomyopathy and hypertensive nephropathy.  BP elevated today, unusually high for patient. - Continue coreg   - If elevated at f/u, consider addition of spiro or ARB if okay with transplant team - Uses midodrine  5 mg PRN for SBP > 100  3. ESRD: S/p living donor kidney transplant at [facility] in 11/24. - On cellcept 750 mg BID and prednisone  5 mg daily (Bacrtim and valcyte for prophylaxis) - Scr 1.5 on 05/01.  4. Ureteral stricture - Had nephrostomy tube, removed in April - Recent US  with evidence of hydronephrosis involving transplanted kidney. May require replacement of nephrostomy tube. - Discussed today's med changes today with his transplant team 5. Mitral regurgitation: s/p MV repair for severe MR.  Echo in 1/24 showed stable repaired mitral valve with no more than mild stenosis and mild regurgitation. Echo 11/24: EF 55%, trivial MR - Reassess on repeat echo - Will need abx with dental work given prosthetic material.  6. CVA: Prior CVA by head imaging.   7. Hyperlipidemia: On 20 mg atorvastatin  daily 8. Ascites: No cirrhosis on liver imaging and RHC did not show RV failure.  He has required therapeutic  paracenteses in the past.  9. Palpitations: Will obtain 14 day Zio.  - NSR on ECG in clinic today.  F/u 6 weeks with APP  Signed, Vernon Abt, PA-C  05/07/2023  [facility] [address] [facility] [place] [address] [phone] (office) [phone] (fax)

## 2023-05-07 NOTE — Progress Notes (Signed)
 ReDS Vest / Clip - 05/07/23 0839       ReDS Vest / Clip   Station Marker C    Ruler Value 34    ReDS Value Range High volume overload    ReDS Actual Value 42

## 2023-05-07 NOTE — Patient Instructions (Signed)
 Medication Changes:  No Changes In Medications at this time.   Lab Work:  PLEASE HAVE LABS DONE AT [facility] AS SCHEDULED   Testing/Procedures:  Your provider has recommended that  you wear a Zio Patch for 14 days.  This monitor will record your heart rhythm for our review.  IF you have any symptoms while wearing the monitor please press the button.  If you have any issues with the patch or you notice a red or orange light on it please call the company at [phone].  Once you remove the patch please mail it back to the company as soon as possible so we can get the results.   ECHOCARDIOGRAM AS SCHEDULED   Follow-Up in: AS SCHEDULED IN 4 WEEKS WITH APP   At the [facility], you and your health needs are our priority. We have a designated team specialized in the treatment of Heart Failure. This Care Team includes your primary Heart Failure Specialized Cardiologist (physician), Advanced Practice Providers (APPs- Physician Assistants and Nurse Practitioners), and Pharmacist who all work together to provide you with the care you need, when you need it.   You may see any of the following providers on your designated Care Team at your next follow up:  Dr. Jules Oar Dr. Peder Bourdon Dr. Alwin Baars Dr. Judyth Nunnery Nieves Bars, NP Ruddy Corral, [place] [facility] [place], [place] Dennise Fitz, NP [place] Lee, NP Luster Salters, PharmD   Please be sure to bring in all your medications bottles to every appointment.   Need to Contact Us :  If you have any questions or concerns before your next appointment please send us  a message through [place] or call our office at [phone].    TO LEAVE A MESSAGE FOR THE NURSE SELECT OPTION 2, PLEASE LEAVE A MESSAGE INCLUDING: YOUR NAME DATE OF BIRTH CALL BACK NUMBER REASON FOR CALL**this is important as we prioritize the call backs  YOU WILL RECEIVE A CALL BACK THE SAME DAY AS LONG AS YOU CALL BEFORE 4:00 PM

## 2023-05-09 LAB — BRAIN NATRIURETIC PEPTIDE: BNP: 234.3 pg/mL — ABNORMAL HIGH (ref 0.0–100.0)

## 2023-05-15 MED ORDER — FUROSEMIDE 20 MG PO TABS: 40.0000 mg | ORAL_TABLET | Freq: Two times a day (BID) | ORAL | Status: DC

## 2023-05-15 MED ORDER — POTASSIUM CHLORIDE CRYS ER 20 MEQ PO TBCR: 40.0000 meq | EXTENDED_RELEASE_TABLET | Freq: Every day | ORAL | Status: AC

## 2023-05-15 NOTE — Telephone Encounter (Signed)
 Attempted to call patient again to advise of recommendations, unable to reach- left message to call back.

## 2023-05-15 NOTE — Telephone Encounter (Signed)
 Pt returned call  States he is aware of medication changes  -med list updated

## 2023-05-17 DIAGNOSIS — R600 Localized edema: Secondary | ICD-10-CM | POA: Diagnosis not present

## 2023-05-17 DIAGNOSIS — Z940 Kidney transplant status: Secondary | ICD-10-CM | POA: Diagnosis not present

## 2023-05-17 DIAGNOSIS — Z9884 Bariatric surgery status: Secondary | ICD-10-CM | POA: Diagnosis not present

## 2023-05-17 DIAGNOSIS — D84821 Immunodeficiency due to drugs: Secondary | ICD-10-CM | POA: Diagnosis not present

## 2023-05-17 DIAGNOSIS — T86898 Other complications of other transplanted tissue: Secondary | ICD-10-CM | POA: Diagnosis not present

## 2023-05-17 DIAGNOSIS — N1330 Unspecified hydronephrosis: Secondary | ICD-10-CM | POA: Diagnosis not present

## 2023-05-17 DIAGNOSIS — I5022 Chronic systolic (congestive) heart failure: Secondary | ICD-10-CM | POA: Diagnosis not present

## 2023-05-17 DIAGNOSIS — Z4822 Encounter for aftercare following kidney transplant: Secondary | ICD-10-CM | POA: Diagnosis not present

## 2023-05-17 DIAGNOSIS — I4892 Unspecified atrial flutter: Secondary | ICD-10-CM | POA: Diagnosis not present

## 2023-05-17 DIAGNOSIS — Z952 Presence of prosthetic heart valve: Secondary | ICD-10-CM | POA: Diagnosis not present

## 2023-05-17 DIAGNOSIS — Z79899 Other long term (current) drug therapy: Secondary | ICD-10-CM | POA: Diagnosis not present

## 2023-05-17 DIAGNOSIS — I110 Hypertensive heart disease with heart failure: Secondary | ICD-10-CM | POA: Diagnosis not present

## 2023-05-17 DIAGNOSIS — N135 Crossing vessel and stricture of ureter without hydronephrosis: Secondary | ICD-10-CM | POA: Diagnosis not present

## 2023-05-17 DIAGNOSIS — Z7952 Long term (current) use of systemic steroids: Secondary | ICD-10-CM | POA: Diagnosis not present

## 2023-05-17 DIAGNOSIS — Z792 Long term (current) use of antibiotics: Secondary | ICD-10-CM | POA: Diagnosis not present

## 2023-05-17 DIAGNOSIS — I5020 Unspecified systolic (congestive) heart failure: Secondary | ICD-10-CM | POA: Diagnosis not present

## 2023-05-17 DIAGNOSIS — R188 Other ascites: Secondary | ICD-10-CM | POA: Diagnosis not present

## 2023-05-17 DIAGNOSIS — D849 Immunodeficiency, unspecified: Secondary | ICD-10-CM | POA: Diagnosis not present

## 2023-05-17 DIAGNOSIS — Z9889 Other specified postprocedural states: Secondary | ICD-10-CM | POA: Diagnosis not present

## 2023-05-22 ENCOUNTER — Telehealth (HOSPITAL_COMMUNITY): Payer: Self-pay | Admitting: Cardiology

## 2023-05-22 ENCOUNTER — Other Ambulatory Visit (HOSPITAL_COMMUNITY): Payer: Self-pay | Admitting: Physician Assistant

## 2023-05-22 DIAGNOSIS — R188 Other ascites: Secondary | ICD-10-CM

## 2023-05-22 NOTE — Telephone Encounter (Signed)
 Patient called to report [facility] CT shows ascites. [facility] unable perform paracentesis until 5/30 or 6/1 (see [facility] transplant team notes)  Pt would like to know if [facility] provider would write order so procedure could be done locally   At Dr Mitzie Anda ok to order or allow [facility] to manage

## 2023-05-22 NOTE — Telephone Encounter (Signed)
 We can order IR paracentesis here.

## 2023-05-23 NOTE — Telephone Encounter (Signed)
 Order placed per [facility] left for transplant team

## 2023-05-24 ENCOUNTER — Ambulatory Visit (HOSPITAL_COMMUNITY)
Admission: RE | Admit: 2023-05-24 | Discharge: 2023-05-24 | Disposition: A | Discharge status: Home / Self Care | Source: Ambulatory Visit | Attending: Cardiology | Admitting: Cardiology

## 2023-05-24 DIAGNOSIS — R188 Other ascites: Secondary | ICD-10-CM | POA: Diagnosis not present

## 2023-05-24 DIAGNOSIS — Z940 Kidney transplant status: Secondary | ICD-10-CM | POA: Insufficient documentation

## 2023-05-24 DIAGNOSIS — N186 End stage renal disease: Secondary | ICD-10-CM | POA: Insufficient documentation

## 2023-05-24 HISTORY — PX: IR PARACENTESIS: IMG2679

## 2023-05-24 LAB — PROTEIN, PLEURAL OR PERITONEAL FLUID: Total protein, fluid: 3.3 g/dL

## 2023-05-24 LAB — CREATININE, FLUID (PLEURAL, PERITONEAL, JP DRAINAGE): Creat, Fluid: 1.6 mg/dL

## 2023-05-24 LAB — LACTATE DEHYDROGENASE, PLEURAL OR PERITONEAL FLUID: LD, Fluid: 89 U/L — ABNORMAL HIGH (ref 3–23)

## 2023-05-24 MED ORDER — LIDOCAINE HCL 1 % IJ SOLN
20.0000 mL | Freq: Once | INTRAMUSCULAR | Status: AC
  Administered 2023-05-24: 10 mL

## 2023-05-24 MED ORDER — LIDOCAINE HCL 1 % IJ SOLN
INTRAMUSCULAR | Status: AC
  Filled 2023-05-24: qty 20

## 2023-05-24 NOTE — Addendum Note (Signed)
 Addended by: Edris Gowers on: 05/24/2023 09:45 AM   Modules accepted: Orders

## 2023-05-24 NOTE — Telephone Encounter (Signed)
 Ok per transplant team Please add LDH and Pro/Creat from fluid

## 2023-05-24 NOTE — Procedures (Signed)
 PROCEDURE SUMMARY:  Successful image-guided paracentesis from the left lower abdomen.  Yielded 6 liters of yellow fluid.  No immediate complications.  EBL: trace Patient tolerated well.   Specimen was sent for labs.  Please see imaging section of Epic for full dictation.  Damian [facility] Kamar Callender PA-C 05/24/2023 2:22 PM

## 2023-05-25 DIAGNOSIS — D849 Immunodeficiency, unspecified: Secondary | ICD-10-CM | POA: Diagnosis not present

## 2023-05-25 DIAGNOSIS — M1711 Unilateral primary osteoarthritis, right knee: Secondary | ICD-10-CM | POA: Diagnosis not present

## 2023-05-25 DIAGNOSIS — M1712 Unilateral primary osteoarthritis, left knee: Secondary | ICD-10-CM | POA: Diagnosis not present

## 2023-05-25 DIAGNOSIS — Z940 Kidney transplant status: Secondary | ICD-10-CM | POA: Diagnosis not present

## 2023-05-27 LAB — BODY FLUID CULTURE W GRAM STAIN
Culture: NO GROWTH
Gram Stain: NONE SEEN

## 2023-05-29 DIAGNOSIS — N1330 Unspecified hydronephrosis: Secondary | ICD-10-CM | POA: Diagnosis not present

## 2023-05-29 DIAGNOSIS — T8619 Other complication of kidney transplant: Secondary | ICD-10-CM | POA: Diagnosis not present

## 2023-05-29 DIAGNOSIS — N135 Crossing vessel and stricture of ureter without hydronephrosis: Secondary | ICD-10-CM | POA: Diagnosis not present

## 2023-06-01 DIAGNOSIS — Z713 Dietary counseling and surveillance: Secondary | ICD-10-CM | POA: Diagnosis not present

## 2023-06-01 DIAGNOSIS — Z9884 Bariatric surgery status: Secondary | ICD-10-CM | POA: Diagnosis not present

## 2023-06-01 DIAGNOSIS — K912 Postsurgical malabsorption, not elsewhere classified: Secondary | ICD-10-CM | POA: Diagnosis not present

## 2023-06-01 DIAGNOSIS — E43 Unspecified severe protein-calorie malnutrition: Secondary | ICD-10-CM | POA: Diagnosis not present

## 2023-06-04 DIAGNOSIS — R002 Palpitations: Secondary | ICD-10-CM | POA: Diagnosis not present

## 2023-06-05 NOTE — Addendum Note (Signed)
 Encounter addended by: Stan Eans, RN on: 06/05/2023 2:34 PM  Actions taken: Imaging Exam ended

## 2023-06-08 ENCOUNTER — Emergency Department (HOSPITAL_BASED_OUTPATIENT_CLINIC_OR_DEPARTMENT_OTHER)
Admission: EM | Admit: 2023-06-08 | Discharge: 2023-06-08 | Disposition: A | Discharge status: Home / Self Care | Attending: Emergency Medicine | Admitting: Emergency Medicine

## 2023-06-08 ENCOUNTER — Other Ambulatory Visit: Payer: Self-pay

## 2023-06-08 ENCOUNTER — Emergency Department (HOSPITAL_BASED_OUTPATIENT_CLINIC_OR_DEPARTMENT_OTHER)

## 2023-06-08 DIAGNOSIS — Z992 Dependence on renal dialysis: Secondary | ICD-10-CM | POA: Diagnosis not present

## 2023-06-08 DIAGNOSIS — J45909 Unspecified asthma, uncomplicated: Secondary | ICD-10-CM | POA: Insufficient documentation

## 2023-06-08 DIAGNOSIS — I5022 Chronic systolic (congestive) heart failure: Secondary | ICD-10-CM | POA: Diagnosis not present

## 2023-06-08 DIAGNOSIS — Z7982 Long term (current) use of aspirin: Secondary | ICD-10-CM | POA: Insufficient documentation

## 2023-06-08 DIAGNOSIS — R600 Localized edema: Secondary | ICD-10-CM | POA: Diagnosis not present

## 2023-06-08 DIAGNOSIS — N186 End stage renal disease: Secondary | ICD-10-CM | POA: Insufficient documentation

## 2023-06-08 DIAGNOSIS — Z940 Kidney transplant status: Secondary | ICD-10-CM | POA: Diagnosis not present

## 2023-06-08 DIAGNOSIS — I132 Hypertensive heart and chronic kidney disease with heart failure and with stage 5 chronic kidney disease, or end stage renal disease: Secondary | ICD-10-CM | POA: Diagnosis not present

## 2023-06-08 DIAGNOSIS — Z79899 Other long term (current) drug therapy: Secondary | ICD-10-CM | POA: Diagnosis not present

## 2023-06-08 DIAGNOSIS — R601 Generalized edema: Secondary | ICD-10-CM | POA: Diagnosis not present

## 2023-06-08 DIAGNOSIS — M25461 Effusion, right knee: Secondary | ICD-10-CM | POA: Diagnosis not present

## 2023-06-08 DIAGNOSIS — M25561 Pain in right knee: Secondary | ICD-10-CM | POA: Diagnosis not present

## 2023-06-08 LAB — CBC WITH DIFFERENTIAL/PLATELET
Abs Immature Granulocytes: 0.25 10*3/uL — ABNORMAL HIGH (ref 0.00–0.07)
Basophils Absolute: 0.1 10*3/uL (ref 0.0–0.1)
Basophils Relative: 2 %
Eosinophils Absolute: 0.2 10*3/uL (ref 0.0–0.5)
Eosinophils Relative: 7 %
HCT: 35.7 % — ABNORMAL LOW (ref 39.0–52.0)
Hemoglobin: 10.7 g/dL — ABNORMAL LOW (ref 13.0–17.0)
Immature Granulocytes: 8 %
Lymphocytes Relative: 8 %
Lymphs Abs: 0.2 10*3/uL — ABNORMAL LOW (ref 0.7–4.0)
MCH: 27.5 pg (ref 26.0–34.0)
MCHC: 30 g/dL (ref 30.0–36.0)
MCV: 91.8 fL (ref 80.0–100.0)
Monocytes Absolute: 0.5 10*3/uL (ref 0.1–1.0)
Monocytes Relative: 18 %
Neutro Abs: 1.7 10*3/uL (ref 1.7–7.7)
Neutrophils Relative %: 57 %
Platelets: 357 10*3/uL (ref 150–400)
RBC: 3.89 MIL/uL — ABNORMAL LOW (ref 4.22–5.81)
RDW: 18.9 % — ABNORMAL HIGH (ref 11.5–15.5)
WBC: 3.1 10*3/uL — ABNORMAL LOW (ref 4.0–10.5)
nRBC: 0 % (ref 0.0–0.2)

## 2023-06-08 LAB — BASIC METABOLIC PANEL WITH GFR
Anion gap: 11 (ref 5–15)
BUN: 26 mg/dL — ABNORMAL HIGH (ref 6–20)
CO2: 22 mmol/L (ref 22–32)
Calcium: 9.3 mg/dL (ref 8.9–10.3)
Chloride: 109 mmol/L (ref 98–111)
Creatinine, Ser: 1.5 mg/dL — ABNORMAL HIGH (ref 0.61–1.24)
GFR, Estimated: 54 mL/min — ABNORMAL LOW (ref >60)
Glucose, Bld: 76 mg/dL (ref 70–99)
Potassium: 4 mmol/L (ref 3.5–5.1)
Sodium: 142 mmol/L (ref 135–145)

## 2023-06-08 MED ORDER — TRAMADOL HCL 50 MG PO TABS: 50.0000 mg | ORAL_TABLET | Freq: Four times a day (QID) | ORAL | 0 refills | Status: DC | PRN

## 2023-06-08 NOTE — ED Triage Notes (Signed)
 Fall in January, pain in R knee and has been getting cortisone shots since then.   Kidney transplant 11/08/2022.   Pt saw orthopedic dr Monday and they told him their office doesn't recommend pain medicine due to transplant pt. Pt has been taking tylenol  without relief.   Pt limping when walking. Having swelling in R knee and pain is uncontrolled. Last cortisone shot was 05/25/23 but states it has not helped

## 2023-06-08 NOTE — ED Provider Notes (Signed)
 [facility] Provider Note   CSN: [id number] Arrival date & time: 06/08/23  1712     History  Chief Complaint  Patient presents with   Knee Pain    Vernon Blackburn is a 58 y.o. male.  Patient is a 58 year old male who presents with right knee pain.  He has a history of CHF, peripheral edema, end-stage renal disease status post kidney transplant in 2024, hypertension, gout.  He has had chronic pain in his right knee since an injury in January.  He has received some steroid injections, most recently on May 23.  He has had some intermittent swelling of his knee and lower leg.  He said he has been having some increased pain and swelling since he went out for a walk this past Sunday.  He has had some intermittent chronic swelling in the knee and lower leg but he says it is more swollen than it normally is.  He denies any known fevers.  No chest pain or shortness of breath.  He has been wearing compression stockings and taking Tylenol  but has not really had any improvement in symptoms.       Home Medications Prior to Admission medications   Medication Sig Start Date End Date Taking? Authorizing Provider  traMADol  (ULTRAM ) 50 MG tablet Take 1 tablet (50 mg total) by mouth every 6 (six) hours as needed. 06/08/23  Yes Hershel Los, MD  acetaminophen  (TYLENOL ) 325 MG tablet Take 650 mg by mouth. 03/24/15   [provider]  Albuterol  Sulfate (PROAIR  RESPICLICK) 108 (90 Base) MCG/ACT AEPB Inhale 2 puffs into the lungs every 6 (six) hours as needed (wheezing/shortness of breath).    [provider]  aspirin  EC 81 MG tablet Take 81 mg by mouth daily. Swallow whole.    [provider]  atorvastatin  (LIPITOR) 20 MG tablet Take 20 mg by mouth. 07/15/20   [provider]  betamethasone 0.5% cream-vitamin a&d ointment 1:1 mixture Apply topically.    [provider]  BREO ELLIPTA  200-25 MCG/ACT AEPB Inhale 1 puff into  the lungs. 07/24/21   [provider]  calcium  acetate (PHOSLO ) 667 MG capsule Take 2,668 mg by mouth 3 (three) times daily with meals. 11/22/19   [provider]  carvedilol  (COREG ) 6.25 MG tablet Take 6.25 mg by mouth 2 (two) times daily.    [provider]  docusate sodium  (COLACE) 50 MG capsule Take 50 mg by mouth.    [provider]  famotidine  (PEPCID ) 20 MG tablet Take 20 mg by mouth 2 (two) times daily.    [provider]  ferrous sulfate  325 (65 FE) MG EC tablet Take 1 tablet by mouth daily with breakfast. 03/28/23 03/27/24  [provider]  fexofenadine (ALLEGRA) 180 MG tablet Take 180 mg by mouth. 07/15/20   [provider]  furosemide  (LASIX ) 20 MG tablet Take 2 tablets (40 mg total) by mouth 2 (two) times daily. 05/15/23   Arleene Belt, PA-C  lactulose (CHRONULAC) 10 GM/15ML solution Take 10 g by mouth 2 (two) times daily as needed for mild constipation.    [provider]  Melatonin 10 MG TABS Take 10 mg by mouth at bedtime.    [provider]  midodrine  (PROAMATINE ) 5 MG tablet Take 5 mg by mouth as needed. For blood pressure less than 120 systolic    [provider]  mycophenolate  (CELLCEPT) 250 MG capsule Take 750 mg by mouth 2 (  two) times daily. 03/28/23 03/27/24  [provider]  pantoprazole  (PROTONIX ) 20 MG tablet Take 20 mg by mouth daily.    [provider]  polyethylene glycol (MIRALAX  / GLYCOLAX ) 17 g packet Take 17 g by mouth every other day.    [provider]  potassium chloride  SA (KLOR-CON  M) 20 MEQ tablet Take 2 tablets (40 mEq total) by mouth daily. 05/15/23   Arleene Belt, PA-C  predniSONE  (DELTASONE ) 5 MG tablet Take 5 mg by mouth daily.    [provider]  sulfamethoxazole-trimethoprim (BACTRIM DS) 800-160 MG tablet Take 1 tablet by mouth 3 (three) times a week. 11/10/22 11/10/23  [provider]  valGANciclovir (VALCYTE)  450 MG tablet Take 450 mg by mouth daily.    [provider]  VYVANSE  30 MG capsule Take 30 mg by mouth every morning. 06/21/19   [provider]      Allergies    Chlorhexidine  and Isosorbide     Review of Systems   Review of Systems  Constitutional:  Negative for fever.  Respiratory:  Negative for shortness of breath.   Cardiovascular:  Positive for leg swelling. Negative for chest pain.  Gastrointestinal:  Negative for abdominal pain, nausea and vomiting.  Musculoskeletal:  Positive for arthralgias and joint swelling. Negative for back pain and neck pain.  Skin:  Negative for wound.  Neurological:  Negative for weakness, numbness and headaches.    Physical Exam Updated Vital Signs BP (!) 138/93   Pulse 78   Temp 98.1 F (36.7 C)   Resp 19   Ht 5\' 9"  (1.753 m)   Wt 83.3 kg   SpO2 100%   BMI 27.11 kg/m  Physical Exam Constitutional:      Appearance: He is well-developed.  HENT:     Head: Normocephalic and atraumatic.  Eyes:     Pupils: Pupils are equal, round, and reactive to light.  Cardiovascular:     Rate and Rhythm: Normal rate and regular rhythm.     Heart sounds: Normal heart sounds.  Pulmonary:     Effort: Pulmonary effort is normal. No respiratory distress.     Breath sounds: Normal breath sounds. No wheezing or rales.  Chest:     Chest wall: No tenderness.  Abdominal:     General: Bowel sounds are normal.     Palpations: Abdomen is soft.     Tenderness: There is no abdominal tenderness. There is no guarding or rebound.  Musculoskeletal:        General: Normal range of motion.     Cervical back: Normal range of motion and neck supple.     Comments: He has moderate swelling of the right leg as compared to the left.  He has 1+ edema to the left leg but 3+ edema to the right leg.  No warmth or erythema.  The edema seems to be more generalized rather than just localized to the knee.  Pedal pulses are intact.  He has generalized tenderness to  the knee.  No gross ligament instability.  Lymphadenopathy:     Cervical: No cervical adenopathy.  Skin:    General: Skin is warm and dry.     Findings: No rash.  Neurological:     Mental Status: He is alert and oriented to person, place, and time.     ED Results / Procedures / Treatments   Labs (all labs ordered are listed, but only abnormal results are displayed) Labs Reviewed  BASIC METABOLIC PANEL WITH  GFR - Abnormal; Notable for the following components:      Result Value   BUN 26 (*)    Creatinine, Ser 1.50 (*)    GFR, Estimated 54 (*)    All other components within normal limits  CBC WITH DIFFERENTIAL/PLATELET - Abnormal; Notable for the following components:   WBC 3.1 (*)    RBC 3.89 (*)    Hemoglobin 10.7 (*)    HCT 35.7 (*)    RDW 18.9 (*)    Lymphs Abs 0.2 (*)    Abs Immature Granulocytes 0.25 (*)    All other components within normal limits    EKG None  Radiology US  Venous Img Lower Right (DVT Study) Result Date: 06/08/2023 CLINICAL DATA:  Right leg swelling. EXAM: RIGHT LOWER EXTREMITY VENOUS DOPPLER ULTRASOUND TECHNIQUE: Gray-scale sonography with compression, as well as color and duplex ultrasound, were performed to evaluate the deep venous system(s) from the level of the common femoral vein through the popliteal and proximal calf veins. COMPARISON:  None Available. FINDINGS: VENOUS Normal compressibility of the common femoral, superficial femoral, and popliteal veins. The calf veins are difficult to evaluate due to edema. Visualized portions of profunda femoral vein and great saphenous vein unremarkable. No filling defects to suggest DVT on grayscale or color Doppler imaging. Doppler waveforms show normal direction of venous flow, normal respiratory plasticity and response to augmentation. Limited views of the contralateral common femoral vein are unremarkable. OTHER Complex fluid in the medial knee measures 5.6 x 3.5 x 2.5 cm. Generalized subcutaneous edema, more  prominent distally. Limitations: Subcutaneous edema. IMPRESSION: 1. No evidence of right lower extremity DVT. 2. Subcutaneous edema. Fluid in the medial knee may represent confluent edema, but is nonspecific. Electronically Signed   By: Chadwick Colonel M.D.   On: 06/08/2023 19:00    Procedures Procedures    Medications Ordered in ED Medications - No data to display  ED Course/ Medical Decision Making/ A&P                                 Medical Decision Making Amount and/or Complexity of Data Reviewed Labs: ordered.  Risk Prescription drug management.   This patient presents to the ED for concern of knee pain, this involves an extensive number of treatment options, and is a complaint that carries with it a high risk of complications and morbidity.  I considered the following differential and admission for this acute, potentially life threatening condition.  The differential diagnosis includes osteoarthritis, gout, septic arthritis, DVT, CHF  MDM:    Patient presents with pain in his right knee.  He has had some ongoing pain since January.  He says he has intermittent swelling of the right knee and leg.  He seems to have more diffuse swelling of the lower leg.  It is more swollen than the left side which she says is typical.  Ultrasound was performed which does not show any evidence of DVT.  He does not have any warmth or erythema that would be more concerning for septic arthritis.  He does not have any systemic symptoms which we more concerning for CHF exacerbation.  Suspect he has some increase in his peripheral edema associated with some increase knee pain related to his osteoarthritis.  His labs are reviewed.  These were compared to recent labs he had done on May 23.  His creatinine is similar.  His hemoglobin is slightly lower but fairly  similar.  It was 11.2 on 523.  He was given a prescription for short course of tramadol .  He was encouraged to follow-up with his orthopedist.  Return  precautions were given.  Discussed giving him an extra dose of Lasix  but he is declining this.  (Labs, imaging, consults)  Labs: I Ordered, and personally interpreted labs.  The pertinent results include: Creatinine is similar to baseline.  His WBC count and hemoglobin are slightly low but similar to recent values.  Imaging Studies ordered: I ordered imaging studies including ultrasound of his lower extremity I independently visualized and interpreted imaging. I agree with the radiologist interpretation  Additional history obtained from chart review.  External records from outside source obtained and reviewed including labs, orthopedic notes  Cardiac Monitoring: The patient was not maintained on a cardiac monitor.  Reevaluation: After the interventions noted above, I reevaluated the patient and found that they have :stayed the same  Social Determinants of Health:  none  Disposition: Discharged to home  Co morbidities that complicate the patient evaluation  Past Medical History:  Diagnosis Date   ADHD    Anemia, chronic renal failure    Asthma    followed by pcp   Benign localized prostatic hyperplasia with lower urinary tract symptoms (LUTS)    Bladder stones    BPH (benign prostatic hyperplasia)    Chronic gout    05-06-2020  per pt last episode 6 months ago,  (involves right knee and right great toe)   Chronic systolic HF (heart failure) (HCC)    followed by cardiology, dr Mitzie Anda @ CHF clinic,   last TEE 03-06-2019 in epic ef 45%   Dyspnea    ESRD on hemodialysis [facility])    primary nephrologist--- dr Ansel Kingdom--- HD on M/W/ F  at [facility] in [place]   Heart murmur    History of bladder stone    History of cellulitis 01/27/2020   ED visit ,  abdominal wall   History of CVA (cerebrovascular accident) without residual deficits    remote infarct per imaging MRI brain in care every where 02-07-2019   History of kidney stones    History of  obstructive sleep apnea    05-06-2020  per pt test positive for osa prior to gastric bypass in 2014 used cpap,  pt stated was retested after alot of weight loss , test was negative    Hypertension    followed by cardiology   Kidney transplant recipient    left kidney   NICM (nonischemic cardiomyopathy) (HCC)    followed by cardiology---- 01/ 2018  ef 20-35%, severe MV stenosis;  last TEE echo in epic 03-06-2019  ef 45%   Peripheral edema    Bilateral lower extremity    S/P minimally invasive mitral valve repair followed by cardiology   06-29-2016  for severe stenosis ;  30 mm [name] via right mini thoracotomy approach;  last TEE echo in epic 03-06-2019 ef 45%, mild LVH, post op mild MR with mean gradient and no significant stenosis   Seasonal and perennial allergic rhinitis    Sleep apnea      Medicines Meds ordered this encounter  Medications   traMADol  (ULTRAM ) 50 MG tablet    Sig: Take 1 tablet (50 mg total) by mouth every 6 (six) hours as needed.    Dispense:  15 tablet    Refill:  0    I have reviewed the patients home  medicines and have made adjustments as needed  Problem List / ED Course: Problem List Items Addressed This Visit   None Visit Diagnoses       Acute pain of right knee    -  Primary             Final Clinical Impression(s) / ED Diagnoses Final diagnoses:  Acute pain of right knee    Rx / DC Orders ED Discharge Orders          Ordered    traMADol  (ULTRAM ) 50 MG tablet  Every 6 hours PRN        06/08/23 1945              Hershel Los, MD 06/08/23 1948

## 2023-06-08 NOTE — ED Notes (Signed)
 Snacks and drink given to pt/ no complaints at this time

## 2023-06-11 DIAGNOSIS — I130 Hypertensive heart and chronic kidney disease with heart failure and stage 1 through stage 4 chronic kidney disease, or unspecified chronic kidney disease: Secondary | ICD-10-CM | POA: Diagnosis not present

## 2023-06-11 DIAGNOSIS — Z9225 Personal history of immunosupression therapy: Secondary | ICD-10-CM | POA: Diagnosis not present

## 2023-06-11 DIAGNOSIS — N186 End stage renal disease: Secondary | ICD-10-CM | POA: Diagnosis not present

## 2023-06-11 DIAGNOSIS — M109 Gout, unspecified: Secondary | ICD-10-CM | POA: Diagnosis not present

## 2023-06-11 DIAGNOSIS — D638 Anemia in other chronic diseases classified elsewhere: Secondary | ICD-10-CM | POA: Diagnosis not present

## 2023-06-11 DIAGNOSIS — Z79899 Other long term (current) drug therapy: Secondary | ICD-10-CM | POA: Diagnosis not present

## 2023-06-11 DIAGNOSIS — J45909 Unspecified asthma, uncomplicated: Secondary | ICD-10-CM | POA: Diagnosis not present

## 2023-06-11 DIAGNOSIS — E785 Hyperlipidemia, unspecified: Secondary | ICD-10-CM | POA: Diagnosis not present

## 2023-06-12 DIAGNOSIS — Z5181 Encounter for therapeutic drug level monitoring: Secondary | ICD-10-CM | POA: Diagnosis not present

## 2023-06-12 DIAGNOSIS — N368 Other specified disorders of urethra: Secondary | ICD-10-CM | POA: Diagnosis not present

## 2023-06-12 DIAGNOSIS — Z940 Kidney transplant status: Secondary | ICD-10-CM | POA: Diagnosis not present

## 2023-06-12 DIAGNOSIS — N318 Other neuromuscular dysfunction of bladder: Secondary | ICD-10-CM | POA: Diagnosis not present

## 2023-06-12 DIAGNOSIS — N3281 Overactive bladder: Secondary | ICD-10-CM | POA: Diagnosis not present

## 2023-06-12 DIAGNOSIS — D849 Immunodeficiency, unspecified: Secondary | ICD-10-CM | POA: Diagnosis not present

## 2023-06-14 DIAGNOSIS — R937 Abnormal findings on diagnostic imaging of other parts of musculoskeletal system: Secondary | ICD-10-CM | POA: Diagnosis not present

## 2023-06-14 DIAGNOSIS — R9349 Abnormal radiologic findings on diagnostic imaging of other urinary organs: Secondary | ICD-10-CM | POA: Diagnosis not present

## 2023-06-14 DIAGNOSIS — Z9884 Bariatric surgery status: Secondary | ICD-10-CM | POA: Diagnosis not present

## 2023-06-14 DIAGNOSIS — Z940 Kidney transplant status: Secondary | ICD-10-CM | POA: Diagnosis not present

## 2023-06-14 DIAGNOSIS — N1330 Unspecified hydronephrosis: Secondary | ICD-10-CM | POA: Diagnosis not present

## 2023-06-14 DIAGNOSIS — Z9889 Other specified postprocedural states: Secondary | ICD-10-CM | POA: Diagnosis not present

## 2023-06-14 DIAGNOSIS — R188 Other ascites: Secondary | ICD-10-CM | POA: Diagnosis not present

## 2023-06-14 DIAGNOSIS — T8619 Other complication of kidney transplant: Secondary | ICD-10-CM | POA: Diagnosis not present

## 2023-06-14 DIAGNOSIS — D849 Immunodeficiency, unspecified: Secondary | ICD-10-CM | POA: Diagnosis not present

## 2023-06-14 DIAGNOSIS — I5022 Chronic systolic (congestive) heart failure: Secondary | ICD-10-CM | POA: Diagnosis not present

## 2023-06-14 DIAGNOSIS — R600 Localized edema: Secondary | ICD-10-CM | POA: Diagnosis not present

## 2023-06-14 DIAGNOSIS — I110 Hypertensive heart disease with heart failure: Secondary | ICD-10-CM | POA: Diagnosis not present

## 2023-06-14 DIAGNOSIS — Z792 Long term (current) use of antibiotics: Secondary | ICD-10-CM | POA: Diagnosis not present

## 2023-06-15 ENCOUNTER — Telehealth: Admitting: Physician Assistant

## 2023-06-15 DIAGNOSIS — G8929 Other chronic pain: Secondary | ICD-10-CM

## 2023-06-15 DIAGNOSIS — M25561 Pain in right knee: Secondary | ICD-10-CM

## 2023-06-15 MED ORDER — METHYLPREDNISOLONE 4 MG PO TBPK: ORAL_TABLET | ORAL | 0 refills | Status: DC

## 2023-06-15 NOTE — Patient Instructions (Signed)
 Vernon Blackburn, thank you for joining Angelia Kelp, PA-C for today's virtual visit.  While this provider is not your primary care provider (PCP), if your PCP is located in our provider database this encounter information will be shared with them immediately following your visit.   A [facility] MyChart account gives you access to today's visit and all your visits, tests, and labs performed at [facility]  click here if you don't have a [facility] MyChart account or go to [email or web address]  Consent: (Patient) Vernon Blackburn provided verbal consent for this virtual visit at the beginning of the encounter.  Current Medications:  Current Outpatient Medications:    methylPREDNISolone (MEDROL DOSEPAK) 4 MG TBPK tablet, 6 day taper; take as  directed on package instructions, Disp: 21 tablet, Rfl: 0   acetaminophen  (TYLENOL ) 325 MG tablet, Take 650 mg by mouth., Disp: , Rfl:    Albuterol  Sulfate (PROAIR  RESPICLICK) 108 (90 Base) MCG/ACT AEPB, Inhale 2 puffs into the lungs every 6 (six) hours as needed (wheezing/shortness of breath)., Disp: , Rfl:    aspirin  EC 81 MG tablet, Take 81 mg by mouth daily. Swallow whole., Disp: , Rfl:    atorvastatin  (LIPITOR) 20 MG tablet, Take 20 mg by mouth., Disp: , Rfl:    betamethasone 0.5% cream-vitamin a&d ointment 1:1 mixture, Apply topically., Disp: , Rfl:    BREO ELLIPTA  200-25 MCG/ACT AEPB, Inhale 1 puff into the lungs., Disp: , Rfl:    calcium  acetate (PHOSLO ) 667 MG capsule, Take 2,668 mg by mouth 3 (three) times daily with meals., Disp: , Rfl:    carvedilol  (COREG ) 6.25 MG tablet, Take 6.25 mg by mouth 2 (two) times daily., Disp: , Rfl:    docusate sodium  (COLACE) 50 MG capsule, Take 50 mg by mouth., Disp: , Rfl:    famotidine  (PEPCID ) 20 MG tablet, Take 20 mg by mouth 2 (two) times daily., Disp: , Rfl:    ferrous sulfate  325 (65 FE) MG EC tablet, Take 1 tablet by mouth daily with breakfast., Disp: , Rfl:    fexofenadine  (ALLEGRA) 180 MG tablet, Take 180 mg by mouth., Disp: , Rfl:    furosemide  (LASIX ) 20 MG tablet, Take 2 tablets (40 mg total) by mouth 2 (two) times daily., Disp: , Rfl:    lactulose (CHRONULAC) 10 GM/15ML solution, Take 10 g by mouth 2 (two) times daily as needed for mild constipation., Disp: , Rfl:    Melatonin 10 MG TABS, Take 10 mg by mouth at bedtime., Disp: , Rfl:    midodrine  (PROAMATINE ) 5 MG tablet, Take 5 mg by mouth as needed. For blood pressure less than 120 systolic, Disp: , Rfl:    mycophenolate  (CELLCEPT) 250 MG capsule, Take 750 mg by mouth 2 (two) times daily., Disp: , Rfl:    pantoprazole  (PROTONIX ) 20 MG tablet, Take 20 mg by mouth daily., Disp: , Rfl:    polyethylene glycol (MIRALAX  / GLYCOLAX ) 17 g packet, Take 17 g by mouth every other day., Disp: , Rfl:    potassium chloride  SA (KLOR-CON  M) 20 MEQ tablet, Take 2 tablets (40 mEq total) by mouth daily., Disp: , Rfl:    predniSONE  (DELTASONE ) 5 MG tablet, Take 5 mg by mouth daily., Disp: , Rfl:    sulfamethoxazole-trimethoprim (BACTRIM DS) 800-160 MG tablet, Take 1 tablet by mouth 3 (three) times a week., Disp: , Rfl:    VYVANSE  30 MG capsule, Take 30 mg by mouth every morning., Disp: , Rfl:  Medications ordered in this encounter:  Meds ordered this encounter  Medications   methylPREDNISolone (MEDROL DOSEPAK) 4 MG TBPK tablet    Sig: 6 day taper; take as  directed on package instructions    Dispense:  21 tablet    Refill:  0    Supervising Provider:   Corine Dice [[id number]]     *If you need refills on other medications prior to your next appointment, please contact your pharmacy*  Follow-Up: Call back or seek an in-person evaluation if the symptoms worsen or if the condition fails to improve as anticipated.  [facility] [phone]  Other Instructions  [facility] Location: [facility] [address], [address] [place],  [place] [address] Phone: [phone] Convenient hours: Monday - Friday: 11 a.m. - 7 p.m. Visit our website to schedule an appointment online or walk-in during clinic hours. Your well-being is our priority. Move better with us !   [facility] [address] [place], [place] [address] URGENT CARE HOURS Monday - Friday: 8:00am to 8:00pm Saturday: 10:00am to 3:00pm [phone]    If you have been instructed to have an in-person evaluation today at a local Urgent Care facility, please use the link below. It will take you to a list of all of our available [facility], including address, phone number and hours of operation. Please do not delay care.  [facility]  If you or a family member do not have a primary care provider, use the link below to schedule a visit and establish care. When you choose a [facility] primary care physician or advanced practice provider, you gain a long-term partner in health. Find a Primary Care Provider  Learn more about [facility]'s in-office and virtual care options: [facility] - Get Care Now

## 2023-06-15 NOTE — Progress Notes (Signed)
 Virtual Visit Consent   [name], you are scheduled for a virtual visit with a [facility] provider today. Just as with appointments in the office, your consent must be obtained to participate. Your consent will be active for this visit and any virtual visit you may have with one of our providers in the next 365 days. If you have a MyChart account, a copy of this consent can be sent to you electronically.  As this is a virtual visit, video technology does not allow for your provider to perform a traditional examination. This may limit your provider's ability to fully assess your condition. If your provider identifies any concerns that need to be evaluated in person or the need to arrange testing (such as labs, EKG, etc.), we will make arrangements to do so. Although advances in technology are sophisticated, we cannot ensure that it will always work on either your end or our end. If the connection with a video visit is poor, the visit may have to be switched to a telephone visit. With either a video or telephone visit, we are not always able to ensure that we have a secure connection.  By engaging in this virtual visit, you consent to the provision of healthcare and authorize for your insurance to be billed (if applicable) for the services provided during this visit. Depending on your insurance coverage, you may receive a charge related to this service.  I need to obtain your verbal consent now. Are you willing to proceed with your visit today? [name] has provided verbal consent on 06/15/2023 for a virtual visit (video or telephone). Angelia Kelp, PA-C  Date: 06/15/2023 10:45 AM   Virtual Visit via Video Note   I, Angelia Kelp, connected with  Vernon Blackburn  ([id number], 04-Apr-1965) on 06/15/23 at  9:00 AM EDT by a video-enabled telemedicine application and verified that I am speaking with the correct person using two identifiers.  Location: Patient: Virtual Visit  Location Patient: Home Provider: Virtual Visit Location Provider: Home Office   I discussed the limitations of evaluation and management by telemedicine and the availability of in person appointments. The patient expressed understanding and agreed to proceed.    History of Present Illness: Vernon Blackburn is a 58 y.o. who identifies as a male who was assigned male at birth, and is being seen today for chronic right knee pain.  HPI: Knee Pain  The incident occurred more than 1 week ago. Incident location: [facility] in January 2025 for initial injury, remote history of meniscal surgery on this same knee. The injury mechanism was a fall. The pain is present in the right knee. The quality of the pain is described as stabbing and aching. The pain is moderate. The pain has been Constant since onset. Associated symptoms include an inability to bear weight and muscle weakness. Pertinent negatives include no loss of motion, loss of sensation, numbness or tingling. He reports no foreign bodies present. The symptoms are aggravated by movement and weight bearing. He has tried rest, ice, elevation and acetaminophen  (Has had 2 steroid injections by [facility], most recently in May 2025; Seen in-person UC on 06/08/23 and given short dose tramadol ) for the symptoms. The treatment provided mild relief.  Patient is s/p kidney transplant in Nov 2024. Cannot take NSAIDs. On low dose prednisone  5mg  for transplant.  Did have Xray in February 2025 on the right knee and was told bone on bone OA.    Problems:  Patient Active Problem List   Diagnosis Date Noted   Colon cancer screening    Diverticulosis of colon without hemorrhage    Grade II internal hemorrhoids    Lipoma of colon    Essential hypertension 07/13/2020   Asthma 07/13/2020   Ascites 07/12/2020   BPH with obstruction/lower urinary tract symptoms 05/25/2020   Abdominal wall cellulitis 01/27/2020   ESRD on dialysis [facility]) 04/22/2019   Long term (current) use  of anticoagulants [Z79.01] 07/17/2016   Syncope and collapse 07/14/2016   Chest pain 07/13/2016   Other chest pain    Coagulopathy (HCC)    S/P minimally invasive mitral valve repair 06/29/2016   CHF (congestive heart failure) (HCC) 06/06/2016   Hypertensive heart and chronic kidney disease with heart failure and stage 1 through stage 4 chronic kidney disease, or chronic kidney disease (HCC) 02/27/2016   Chronic systolic HF (heart failure) (HCC)    Cardiomyopathy (HCC)    Pulmonary HTN (HCC)    DOE (dyspnea on exertion) 01/02/2016   Chronic kidney disease (CKD), stage IV (severe) (HCC)    Anemia    Edema 11/15/2011    Allergies:  Allergies  Allergen Reactions   Chlorhexidine  Itching   Isosorbide      Hypotension    Medications:  Current Outpatient Medications:    methylPREDNISolone (MEDROL DOSEPAK) 4 MG TBPK tablet, 6 day taper; take as  directed on package instructions, Disp: 21 tablet, Rfl: 0   acetaminophen  (TYLENOL ) 325 MG tablet, Take 650 mg by mouth., Disp: , Rfl:    Albuterol  Sulfate (PROAIR  RESPICLICK) 108 (90 Base) MCG/ACT AEPB, Inhale 2 puffs into the lungs every 6 (six) hours as needed (wheezing/shortness of breath)., Disp: , Rfl:    aspirin  EC 81 MG tablet, Take 81 mg by mouth daily. Swallow whole., Disp: , Rfl:    atorvastatin  (LIPITOR) 20 MG tablet, Take 20 mg by mouth., Disp: , Rfl:    betamethasone 0.5% cream-vitamin a&d ointment 1:1 mixture, Apply topically., Disp: , Rfl:    BREO ELLIPTA  200-25 MCG/ACT AEPB, Inhale 1 puff into the lungs., Disp: , Rfl:    calcium  acetate (PHOSLO ) 667 MG capsule, Take 2,668 mg by mouth 3 (three) times daily with meals., Disp: , Rfl:    carvedilol  (COREG ) 6.25 MG tablet, Take 6.25 mg by mouth 2 (two) times daily., Disp: , Rfl:    docusate sodium  (COLACE) 50 MG capsule, Take 50 mg by mouth., Disp: , Rfl:    famotidine  (PEPCID ) 20 MG tablet, Take 20 mg by mouth 2 (two) times daily., Disp: , Rfl:    ferrous sulfate  325 (65 FE) MG EC  tablet, Take 1 tablet by mouth daily with breakfast., Disp: , Rfl:    fexofenadine (ALLEGRA) 180 MG tablet, Take 180 mg by mouth., Disp: , Rfl:    furosemide  (LASIX ) 20 MG tablet, Take 2 tablets (40 mg total) by mouth 2 (two) times daily., Disp: , Rfl:    lactulose (CHRONULAC) 10 GM/15ML solution, Take 10 g by mouth 2 (two) times daily as needed for mild constipation., Disp: , Rfl:    Melatonin 10 MG TABS, Take 10 mg by mouth at bedtime., Disp: , Rfl:    midodrine  (PROAMATINE ) 5 MG tablet, Take 5 mg by mouth as needed. For blood pressure less than 120 systolic, Disp: , Rfl:    mycophenolate  (CELLCEPT) 250 MG capsule, Take 750 mg by mouth 2 (two) times daily., Disp: , Rfl:    pantoprazole  (PROTONIX ) 20 MG tablet, Take 20 mg by  mouth daily., Disp: , Rfl:    polyethylene glycol (MIRALAX  / GLYCOLAX ) 17 g packet, Take 17 g by mouth every other day., Disp: , Rfl:    potassium chloride  SA (KLOR-CON  M) 20 MEQ tablet, Take 2 tablets (40 mEq total) by mouth daily., Disp: , Rfl:    predniSONE  (DELTASONE ) 5 MG tablet, Take 5 mg by mouth daily., Disp: , Rfl:    sulfamethoxazole-trimethoprim (BACTRIM DS) 800-160 MG tablet, Take 1 tablet by mouth 3 (three) times a week., Disp: , Rfl:    VYVANSE  30 MG capsule, Take 30 mg by mouth every morning., Disp: , Rfl:   Observations/Objective: Patient is well-developed, well-nourished in no acute distress.  Resting comfortably at home.  Head is normocephalic, atraumatic.  No labored breathing.  Speech is clear and coherent with logical content.  Patient is alert and oriented at baseline.    Assessment and Plan: 1. Chronic pain of right knee (Primary) - methylPREDNISolone (MEDROL DOSEPAK) 4 MG TBPK tablet; 6 day taper; take as  directed on package instructions  Dispense: 21 tablet; Refill: 0  - Acute on chronic issue - Advised we are unable to refill Tramadol  due to it being controlled substance - Will provide Medrol taper  - Can call transplant team to see if he  may could use topical Voltaren gel since it is minimally absorbed systemically (suggested in studies to be less than 15%), do not use unless they approve - Will provide information about Orthopedic Urgent Care near him if needed for worsening or not improving symptoms - Can continue Tylenol  arthritis - Ice and elevate  - Seek further evaluation if fails treatment or worsens  Follow Up Instructions: I discussed the assessment and treatment plan with the patient. The patient was provided an opportunity to ask questions and all were answered. The patient agreed with the plan and demonstrated an understanding of the instructions.  A copy of instructions were sent to the patient via MyChart unless otherwise noted below.    The patient was advised to call back or seek an in-person evaluation if the symptoms worsen or if the condition fails to improve as anticipated.    Angelia Kelp, PA-C

## 2023-06-20 ENCOUNTER — Telehealth (HOSPITAL_COMMUNITY): Payer: Self-pay

## 2023-06-20 NOTE — Telephone Encounter (Signed)
 Called to confirm/remind patient of their appointment at the [facility] on 06/21/23.   Appointment:   [x] Confirmed  [] Left mess   [] No answer/No voice mail  [] VM Full/unable to leave message  [] Phone not in service  Patient reminded to bring all medications and/or complete list.  Confirmed patient has transportation. Gave directions, instructed to utilize valet parking.

## 2023-06-21 ENCOUNTER — Encounter (HOSPITAL_COMMUNITY): Payer: Self-pay

## 2023-06-21 ENCOUNTER — Ambulatory Visit (HOSPITAL_BASED_OUTPATIENT_CLINIC_OR_DEPARTMENT_OTHER)
Admission: RE | Admit: 2023-06-21 | Discharge: 2023-06-21 | Disposition: A | Discharge status: Home / Self Care | Source: Ambulatory Visit | Attending: Adult Health | Admitting: Adult Health

## 2023-06-21 ENCOUNTER — Ambulatory Visit (HOSPITAL_COMMUNITY)
Admission: RE | Admit: 2023-06-21 | Discharge: 2023-06-21 | Disposition: A | Discharge status: Home / Self Care | Source: Ambulatory Visit | Attending: Adult Health | Admitting: Adult Health

## 2023-06-21 VITALS — BP 126/84 | HR 77 | Ht 69.0 in | Wt 171.2 lb

## 2023-06-21 DIAGNOSIS — I272 Pulmonary hypertension, unspecified: Secondary | ICD-10-CM | POA: Diagnosis not present

## 2023-06-21 DIAGNOSIS — R002 Palpitations: Secondary | ICD-10-CM

## 2023-06-21 DIAGNOSIS — I132 Hypertensive heart and chronic kidney disease with heart failure and with stage 5 chronic kidney disease, or end stage renal disease: Secondary | ICD-10-CM | POA: Diagnosis not present

## 2023-06-21 DIAGNOSIS — E785 Hyperlipidemia, unspecified: Secondary | ICD-10-CM | POA: Diagnosis not present

## 2023-06-21 DIAGNOSIS — Z940 Kidney transplant status: Secondary | ICD-10-CM | POA: Diagnosis not present

## 2023-06-21 DIAGNOSIS — I5032 Chronic diastolic (congestive) heart failure: Secondary | ICD-10-CM | POA: Diagnosis not present

## 2023-06-21 DIAGNOSIS — R188 Other ascites: Secondary | ICD-10-CM | POA: Diagnosis not present

## 2023-06-21 DIAGNOSIS — I420 Dilated cardiomyopathy: Secondary | ICD-10-CM | POA: Diagnosis not present

## 2023-06-21 DIAGNOSIS — I471 Supraventricular tachycardia, unspecified: Secondary | ICD-10-CM | POA: Diagnosis not present

## 2023-06-21 DIAGNOSIS — Z79899 Other long term (current) drug therapy: Secondary | ICD-10-CM | POA: Insufficient documentation

## 2023-06-21 DIAGNOSIS — Z79624 Long term (current) use of inhibitors of nucleotide synthesis: Secondary | ICD-10-CM | POA: Diagnosis not present

## 2023-06-21 DIAGNOSIS — N186 End stage renal disease: Secondary | ICD-10-CM | POA: Diagnosis not present

## 2023-06-21 DIAGNOSIS — I052 Rheumatic mitral stenosis with insufficiency: Secondary | ICD-10-CM | POA: Insufficient documentation

## 2023-06-21 DIAGNOSIS — I428 Other cardiomyopathies: Secondary | ICD-10-CM | POA: Insufficient documentation

## 2023-06-21 DIAGNOSIS — Z8673 Personal history of transient ischemic attack (TIA), and cerebral infarction without residual deficits: Secondary | ICD-10-CM | POA: Diagnosis not present

## 2023-06-21 DIAGNOSIS — I5022 Chronic systolic (congestive) heart failure: Secondary | ICD-10-CM | POA: Insufficient documentation

## 2023-06-21 DIAGNOSIS — Z7952 Long term (current) use of systemic steroids: Secondary | ICD-10-CM | POA: Diagnosis not present

## 2023-06-21 DIAGNOSIS — Z952 Presence of prosthetic heart valve: Secondary | ICD-10-CM | POA: Insufficient documentation

## 2023-06-21 DIAGNOSIS — I10 Essential (primary) hypertension: Secondary | ICD-10-CM | POA: Diagnosis not present

## 2023-06-21 LAB — ECHOCARDIOGRAM COMPLETE
Area-P 1/2: 4.49 cm2
MV VTI: 2.17 cm2
S' Lateral: 4.1 cm
Single Plane A4C EF: 49.1 %

## 2023-06-21 NOTE — Progress Notes (Signed)
  Echocardiogram 2D Echocardiogram has been performed.  Dione Franks 06/21/2023, 11:29 AM

## 2023-06-21 NOTE — Progress Notes (Signed)
 Heart Failure Clinic Note  PCP:  Adrian Hopper, MD  Cardiologist:  Dr. Mitzie Anda   History of Present Illness: Vernon Blackburn is a 58 y.o. male who has a history of long-standing, poorly-controlled HTN, CKD stage 3-4, chronic systolic CHF and severe mitral regurgitation.  He has had hypertension for years.  He has had known CKD, followed by [facility].  He developed severe dyspnea in 12/17 and was admitted with acute systolic CHF.  Echo showed EF 20-25% with severe MR.  He was diuresed and discharged.   TEE was done in 5/18 to assess mitral valve.  There was severe MR.  The MV was thickened and did not coapt well.  Suspect there was a component of secondary MR with moderate LV dilation, however the thickened leaflets suggested a component of primary MR as well.   He had right and left heart cath in 6/18.  No significant CAD, elevated filling pressures.    In 6/18, he had mitral valve repair.  Post-op echo in 7/18 showed EF 25-30% with stable MV repair (mild MR, mean gradient 4 mmHg).  Echo 12/18 with EF 30-35%, moderate LV dilation, s/p MVR repair with trivial MR.  Echo in 4/19 showed EF up to 40-45% with stable repaired mitral valve.   He had an echo in 8/20 with EF 35%, mild LV dilation, s/p MV repair with mean gradient 4 mmHg and trivial MR, mildly decreased RV systolic function.   He [facility] with worsening renal function and HD was started.  Echo at [facility] in 2/21 showed EF 40-45%, mild LV dilation, moderate LVH, severe mitral stenosis with mean gradient 7 mmHg and eccentric mild MR.  Head imagining showed an old CVA.   TEE was done to followup on ?severe mitral stenosis in 3/21.  This showed EF 45%, moderate LVH, global mild hypokinesis, mildly decreased RV systolic function; s/p MV repair, mild MR with mean gradient 4 mmHg, no significant mitral stenosis.  CPX in 11/21 showed peak VO2 17.7 (51% predicted), RER 1.12, VE/VCO2 slope 29.  Moderate functional limitation due to  mixed restrictive/obstructive lung disease.  Echo in 7/22 showed EF 50-55%, s/p MV repair with trivial MR and mean gradient 5 mmHg.   Patient was admitted in 7/22 with abdominal distention and found to have ascites, cause still uncertain (unless simply due to inadequate dialysis prescription).  No evidence for cirrhosis by CT. He had paracentesis. He additionally had TURP for enlarged prostate and has ended up with a suprapubic catheter.    RHC in 10/22 to assess RV function in the setting of ascites of uncertain etiology showed normal right and left heart filling pressures, normal PA pressure, and normal cardiac output.  He has been seen by GI and continues to require paracenteses.  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   Echo in 1/24 showed EF 50%, mild LVH, mild global hypokinesis, mildly decreased RV systolic function with mild RV enlargement, s/p mitral valve repair with mild MR.  Cardiolite in 1/24 showed no ischemia/infarction.   He underwent living donor kidney transplant in 11/24. He was admitted to [facility] in 04/25 with neutropenic fever and septic shock 2/2 Klebsiella urosepsis. Had nephrostomy tub removed April 9th. Had slow passage of dye on antegrade nephrostogram through a stenotic kinked ureter.  He was seen in the HF clinic 05/07/23. Volume overloaded. ReDS 42%. Lasix  was increased lasix  40 mg twice  a day .   Saw [facility]plant team 05/17/23. Placed on lasix  40 mg daily with an extra 40 mg lasix  Mon-Wed-Fri.   Today he returns for HF follow up.Overall feeling much better. Denies SOB/PND/Orthopnea.  Limited by knee pain. Appetite ok. No fever or chills. Weight at home  169-171 pounds. Taking all medications.   He is an Pensions consultant, and currently out on disability.    PMH: 1. Gout 2. HTN: Long-standing, poor control. 3. ESRD.  Likely hypertensive nephropathy.  - Sp liver donor kidney transplant at [facility] 11/24 4. Dilated  cardiomyopathy: May be due to long-standing HTN.  - Echo (1/18): EF 20-25%, severe MR - Echo (2/18): EF 25%, severe MR.  - TEE (5/18): Moderate LV dilation with severe global hypokinesis, EF 25-30%, normal RV size with mildly decreased systolic function, RV-RA gradient 55 mmHg, severe MR with mildly thickened leaflets with inadequate coaptation and ERO 0.52 cm^2 by PISA and systolic flow reversal in the pulmonary vein doppler pattern => secondary MR from dilated LV but thickened leaflets lead to concern for component of primary MR as well.  - RHC/LHC (6/18): No significant CAD.  Mean RA 15, PA 76/31 mean 48, mean PCWP 45, CI 3.02 Fick, 2.64 thermo.  - Echo (7/18): EF 25-30%, mild dilation, diffuse hypokinesis, s/p MV repair with mean gradient 4 mmHg and mild MR, PASP 37 mmHg, normal RV size and systolic function.  - Echo (12/18): EF 30-35%, moderate LV dilation with diffuse hypokinesis, stable MV repair.  - Echo (4/19): EF 40-45%, severe LV dilation, s/p mitral valve repair with mean gradient 4 mmHg, no mitral regurgitation, severe RV dilation with normal systolic function.  - Echo (8/20): EF 35%, mild LV dilation, s/p MV repair with mean gradient 4 mmHg and trivial MR, mildly decreased RV systolic function.  - Echo (2/21, [facility]):  EF 40-45%, mild LV dilation, moderate LVH, severe mitral stenosis with mean gradient 7 mmHg and eccentric mild MR. - TEE (3/21): EF 45%, moderate LVH, global mild hypokinesis, mildly decreased RV systolic function; s/p MV repair, mild MR with mean gradient 4 mmHg, no significant mitral stenosis.  - CPX (11/21): peak VO2 17.7 (51% predicted), RER 1.12, VE/VCO2 slope 29.  Moderate functional limitation due to mixed restrictive/obstructive lung disease.  - Echo (7/22): EF 50-55%, s/p MV repair with trivial MR and mean gradient 5 mmHg.  - RHC (10/22): mean RA 1, PA 17/2, mean PCWP 2, CI 3.3 - Echo (1/24): EF 50%, mild LVH, mild global hypokinesis, mildly decreased RV systolic  function with mild RV enlargement, s/p mitral valve repair with mild MR.  - Cardiolite (1/24): EF 45%, no ischemia/infarction.  - Echo at [facility] 11/24: EF 55%, RV okay, trivial MR 5. Severe MR: S/p MV repair in 6/18. 6. H/o CVA 7. BPH: S/p TURP, now with suprapubic catheter.  8. H/o ascites (7/22) with paracenteses.   SH: Married, lives in [place], nonsmoker, no drugs, occasional ETOH.  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   Family History  Problem Relation Age of Onset   Diabetes Mother    Hypertension Mother    Heart failure Mother    Colon cancer Neg Hx    Esophageal cancer Neg Hx    Rectal cancer Neg Hx    Stomach cancer Neg Hx    ROS: All systems reviewed and negative except as per HPI.   Current Outpatient Medications  Medication Sig Dispense Refill   acetaminophen  (TYLENOL ) 325 MG tablet Take 650 mg  by mouth.     Albuterol  Sulfate (PROAIR  RESPICLICK) 108 (90 Base) MCG/ACT AEPB Inhale 2 puffs into the lungs every 6 (six) hours as needed (wheezing/shortness of breath).     aspirin  EC 81 MG tablet Take 81 mg by mouth daily. Swallow whole.     atorvastatin  (LIPITOR) 20 MG tablet Take 20 mg by mouth.     betamethasone 0.5% cream-vitamin a&d ointment 1:1 mixture Apply topically.     BREO ELLIPTA  200-25 MCG/ACT AEPB Inhale 1 puff into the lungs.     calcium  acetate (PHOSLO ) 667 MG capsule Take 2,668 mg by mouth 3 (three) times daily with meals.     carvedilol  (COREG ) 6.25 MG tablet Take 6.25 mg by mouth 2 (two) times daily.     docusate sodium  (COLACE) 50 MG capsule Take 50 mg by mouth.     famotidine  (PEPCID ) 20 MG tablet Take 20 mg by mouth 2 (two) times daily.     fexofenadine (ALLEGRA) 180 MG tablet Take 180 mg by mouth.     furosemide  (LASIX ) 20 MG tablet Take 2 tablets (40 mg total) by mouth 2 (two) times daily.     lactulose (CHRONULAC) 10 GM/15ML solution Take 10 g by mouth 2 (two) times daily as needed for mild constipation.     Melatonin 10 MG  TABS Take 10 mg by mouth at bedtime.     midodrine  (PROAMATINE ) 5 MG tablet Take 5 mg by mouth as needed. For blood pressure less than 120 systolic     mycophenolate  (CELLCEPT) 250 MG capsule Take 750 mg by mouth 2 (two) times daily.     pantoprazole  (PROTONIX ) 20 MG tablet Take 20 mg by mouth daily.     polyethylene glycol (MIRALAX  / GLYCOLAX ) 17 g packet Take 17 g by mouth every other day.     predniSONE  (DELTASONE ) 5 MG tablet Take 5 mg by mouth daily.     sulfamethoxazole-trimethoprim (BACTRIM DS) 800-160 MG tablet Take 1 tablet by mouth 3 (three) times a week.     VYVANSE  30 MG capsule Take 30 mg by mouth every morning.     potassium chloride  SA (KLOR-CON  M) 20 MEQ tablet Take 2 tablets (40 mEq total) by mouth daily. (Patient not taking: Reported on 06/21/2023)     No current facility-administered medications for this encounter.   Exam:   BP 126/84   Pulse 77   Ht 5' 9 (1.753 m)   Wt 77.7 kg (171 lb 3.2 oz)   SpO2 100%   BMI 25.28 kg/m  Wt Readings from Last 3 Encounters:  06/21/23 77.7 kg (171 lb 3.2 oz)  06/08/23 83.3 kg (183 lb 9.6 oz)  05/07/23 86.2 kg (190 lb)    General:   No resp difficulty Neck: supple. no JVD.  Cor: PMI nondisplaced. Regular rate & rhythm. No rubs, gallops or murmurs. Lungs: clear Abdomen: soft, nontender, nondistended.  Extremities: no cyanosis, clubbing, rash, LLE edema 1+ > RLE.  Neuro: alert & oriented x3  Assessment/Plan: 1. Chronic systolic CHF now with recovered EF: Echo in 7/18 with EF 25-30%. Nonischemic cardiomyopathy based on cath in 6/18.  Cause of cardiomyopathy may be long-standing, poorly controlled HTN.  NYHA class II symptoms.  He improved symptomatically s/p MV repair.  Echo in 4/19 with EF 40-45%. Echo at [facility] in 2/21 showed EF 40-45%.   TEE in 3/21 with EF 45%.  Echo in 5/22 with EF up to 50-55%.  Echo in 1/24 showed EF 50%, mild LVH, mild  global hypokinesis, mildly decreased RV systolic function with mild RV enlargement, s/p mitral  valve repair with mild MR. RHC in 10/22 was normal, his ascites cannot be explained by RV failure.  Echo 11/24: EF 55%, RV okay, trivial MR. He is not volume overloaded on exam.   - NYHA II.  ReDs reading: 33 %, normal Volume status stable.  Continue lasix  40 mg daily with extra 40 mg Mon-Wed-Fri  - Continue coreg  6.25 mg BID - Continue with compression stockings.  -Echo result pending.  2. HTN: Possible hypertensive cardiomyopathy and hypertensive nephropathy.  Stable.  - Continue coreg   - Uses midodrine  5 mg PRN for SBP <120 . He has not used recently  3. ESRD: S/p living donor kidney transplant at [facility] in 11/24. - On cellcept 750 mg BID and prednisone  5 mg daily (Bacrtim and valcyte for prophylaxis) 4. Ureteral stricture - Had nephrostomy tube, removed in April - Recent US  with evidence of hydronephrosis involving transplanted kidney. He has not needed an intervention.  5. Mitral regurgitation: s/p MV repair for severe MR.  Echo in 1/24 showed stable repaired mitral valve with no more than mild stenosis and mild regurgitation. Echo 11/24: EF 55%, trivial MR - Today Echo results are pending.  - Will need abx with dental work given prosthetic material.  6. CVA: Prior CVA by head imaging.   7. Hyperlipidemia: On 20 mg atorvastatin  daily 8. Ascites: No cirrhosis on liver imaging and RHC did not show RV failure.  He has required therapeutic paracenteses in the past.  9. Palpitations: Will obtain 14 day Zio.  Waiting on results from Zio. Having some SVT. Consider increasing coreg  at beditme. Will discuss with Dr Mitzie Anda    Follow up Dr Mitzie Anda 3  months.   Frazier Jacob, NP  06/21/2023  [facility] [address] Heart and Vascular [place] [address] [phone] (office) [phone] (fax)

## 2023-06-21 NOTE — Progress Notes (Signed)
 ReDS Vest / Clip - 06/21/23 1125       ReDS Vest / Clip   Station Marker C    Ruler Value 29    ReDS Value Range Low volume    ReDS Actual Value 33

## 2023-06-21 NOTE — Patient Instructions (Signed)
 Great to see you today!!!  Continue current medications  Your physician recommends that you schedule a follow-up appointment in: 3 months  If you have any questions or concerns before your next appointment please send us  a message through [facility] or call our office at [phone].    TO LEAVE A MESSAGE FOR THE NURSE SELECT OPTION 2, PLEASE LEAVE A MESSAGE INCLUDING: YOUR NAME DATE OF BIRTH CALL BACK NUMBER REASON FOR CALL**this is important as we prioritize the call backs  YOU WILL RECEIVE A CALL BACK THE SAME DAY AS LONG AS YOU CALL BEFORE 4:00 PM  At the [facility], you and your health needs are our priority. As part of our continuing mission to provide you with exceptional heart care, we have created designated Provider Care Teams. These Care Teams include your primary Cardiologist (physician) and Advanced Practice Providers (APPs- Physician Assistants and Nurse Practitioners) who all work together to provide you with the care you need, when you need it.   You may see any of the following providers on your designated Care Team at your next follow up: Dr Jules Oar Dr Peder Bourdon Dr. Alwin Baars Dr. Arta Lark Amy Marijane Shoulders, NP Ruddy Corral, [place] [facility] [place], [place] Dennise Fitz, NP [place] Lee, NP Shawnee Dellen, NP Luster Salters, PharmD Bevely Brush, PharmD   Please be sure to bring in all your medications bottles to every appointment.    Thank you for choosing [facility]

## 2023-06-22 DIAGNOSIS — D849 Immunodeficiency, unspecified: Secondary | ICD-10-CM | POA: Diagnosis not present

## 2023-06-22 DIAGNOSIS — Z940 Kidney transplant status: Secondary | ICD-10-CM | POA: Diagnosis not present

## 2023-06-25 ENCOUNTER — Ambulatory Visit (HOSPITAL_COMMUNITY): Payer: Self-pay | Admitting: Physician Assistant

## 2023-06-26 DIAGNOSIS — R634 Abnormal weight loss: Secondary | ICD-10-CM | POA: Diagnosis not present

## 2023-06-26 DIAGNOSIS — Z713 Dietary counseling and surveillance: Secondary | ICD-10-CM | POA: Diagnosis not present

## 2023-06-26 DIAGNOSIS — K912 Postsurgical malabsorption, not elsewhere classified: Secondary | ICD-10-CM | POA: Diagnosis not present

## 2023-06-29 DIAGNOSIS — R188 Other ascites: Secondary | ICD-10-CM | POA: Diagnosis not present

## 2023-06-29 DIAGNOSIS — K761 Chronic passive congestion of liver: Secondary | ICD-10-CM | POA: Diagnosis not present

## 2023-06-29 DIAGNOSIS — Z940 Kidney transplant status: Secondary | ICD-10-CM | POA: Diagnosis not present

## 2023-07-13 ENCOUNTER — Telehealth (HOSPITAL_COMMUNITY): Payer: Self-pay | Admitting: Cardiology

## 2023-07-13 DIAGNOSIS — R188 Other ascites: Secondary | ICD-10-CM

## 2023-07-13 NOTE — Telephone Encounter (Signed)
 Patient called to request order for paracentesis Reports ascites has returned with abdominal distension and SOB     Please advise-ok to place order

## 2023-07-13 NOTE — Telephone Encounter (Signed)
 ORDER PLACED ATTEMPTED TO CONTACT PATIENT NO ANSWER UNABLE TO LEAVE MESSAGE

## 2023-07-16 NOTE — Telephone Encounter (Signed)
 Pt aware order placed, IR to schedule   [facility] for IR [phone]

## 2023-07-17 DIAGNOSIS — Z940 Kidney transplant status: Secondary | ICD-10-CM | POA: Diagnosis not present

## 2023-07-17 DIAGNOSIS — Z713 Dietary counseling and surveillance: Secondary | ICD-10-CM | POA: Diagnosis not present

## 2023-07-18 ENCOUNTER — Ambulatory Visit (HOSPITAL_COMMUNITY)
Admission: RE | Admit: 2023-07-18 | Discharge: 2023-07-18 | Disposition: A | Discharge status: Home / Self Care | Source: Ambulatory Visit | Attending: Cardiology | Admitting: Cardiology

## 2023-07-18 ENCOUNTER — Other Ambulatory Visit (HOSPITAL_COMMUNITY)

## 2023-07-18 DIAGNOSIS — R188 Other ascites: Secondary | ICD-10-CM | POA: Diagnosis not present

## 2023-07-18 DIAGNOSIS — Z940 Kidney transplant status: Secondary | ICD-10-CM | POA: Diagnosis not present

## 2023-07-18 DIAGNOSIS — R82998 Other abnormal findings in urine: Secondary | ICD-10-CM | POA: Diagnosis not present

## 2023-07-18 HISTORY — PX: IR PARACENTESIS: IMG2679

## 2023-07-18 MED ORDER — LIDOCAINE HCL (PF) 1 % IJ SOLN
30.0000 mL | Freq: Once | INTRAMUSCULAR | Status: AC
  Administered 2023-07-18: 20 mL

## 2023-07-18 MED ORDER — LIDOCAINE HCL 1 % IJ SOLN
INTRAMUSCULAR | Status: AC
  Filled 2023-07-18: qty 20

## 2023-07-18 NOTE — Procedures (Signed)
 PROCEDURE SUMMARY:  Successful image-guided paracentesis from the left lower abdomen.  Yielded 3.0 liters of clear yellow fluid.  No immediate complications.  EBL: trace Patient tolerated well.   Specimen not sent for labs.  Please see imaging section of Epic for full dictation.  Kimble DEL Zaydn Gutridge PA-C 07/18/2023 12:51 PM

## 2023-07-20 DIAGNOSIS — Z940 Kidney transplant status: Secondary | ICD-10-CM | POA: Diagnosis not present

## 2023-07-20 DIAGNOSIS — D849 Immunodeficiency, unspecified: Secondary | ICD-10-CM | POA: Diagnosis not present

## 2023-07-25 DIAGNOSIS — Z0189 Encounter for other specified special examinations: Secondary | ICD-10-CM | POA: Diagnosis not present

## 2023-07-26 DIAGNOSIS — N189 Chronic kidney disease, unspecified: Secondary | ICD-10-CM | POA: Diagnosis not present

## 2023-07-26 DIAGNOSIS — D631 Anemia in chronic kidney disease: Secondary | ICD-10-CM | POA: Diagnosis not present

## 2023-07-27 DIAGNOSIS — Z9884 Bariatric surgery status: Secondary | ICD-10-CM | POA: Diagnosis not present

## 2023-07-27 DIAGNOSIS — Z713 Dietary counseling and surveillance: Secondary | ICD-10-CM | POA: Diagnosis not present

## 2023-07-27 DIAGNOSIS — E440 Moderate protein-calorie malnutrition: Secondary | ICD-10-CM | POA: Diagnosis not present

## 2023-07-27 DIAGNOSIS — K912 Postsurgical malabsorption, not elsewhere classified: Secondary | ICD-10-CM | POA: Diagnosis not present

## 2023-07-30 DIAGNOSIS — I5032 Chronic diastolic (congestive) heart failure: Secondary | ICD-10-CM | POA: Diagnosis not present

## 2023-07-30 DIAGNOSIS — R188 Other ascites: Secondary | ICD-10-CM | POA: Diagnosis not present

## 2023-07-30 DIAGNOSIS — I425 Other restrictive cardiomyopathy: Secondary | ICD-10-CM | POA: Diagnosis not present

## 2023-07-30 DIAGNOSIS — Z9889 Other specified postprocedural states: Secondary | ICD-10-CM | POA: Diagnosis not present

## 2023-07-30 DIAGNOSIS — I110 Hypertensive heart disease with heart failure: Secondary | ICD-10-CM | POA: Diagnosis not present

## 2023-07-30 DIAGNOSIS — Z940 Kidney transplant status: Secondary | ICD-10-CM | POA: Diagnosis not present

## 2023-07-30 DIAGNOSIS — I509 Heart failure, unspecified: Secondary | ICD-10-CM | POA: Diagnosis not present

## 2023-08-09 DIAGNOSIS — Z9884 Bariatric surgery status: Secondary | ICD-10-CM | POA: Diagnosis not present

## 2023-08-09 DIAGNOSIS — Z9889 Other specified postprocedural states: Secondary | ICD-10-CM | POA: Diagnosis not present

## 2023-08-09 DIAGNOSIS — Z940 Kidney transplant status: Secondary | ICD-10-CM | POA: Diagnosis not present

## 2023-08-09 DIAGNOSIS — R9349 Abnormal radiologic findings on diagnostic imaging of other urinary organs: Secondary | ICD-10-CM | POA: Diagnosis not present

## 2023-08-09 DIAGNOSIS — R937 Abnormal findings on diagnostic imaging of other parts of musculoskeletal system: Secondary | ICD-10-CM | POA: Diagnosis not present

## 2023-08-09 DIAGNOSIS — R600 Localized edema: Secondary | ICD-10-CM | POA: Diagnosis not present

## 2023-08-13 ENCOUNTER — Telehealth (HOSPITAL_COMMUNITY): Payer: Self-pay | Admitting: Cardiology

## 2023-08-13 DIAGNOSIS — R188 Other ascites: Secondary | ICD-10-CM

## 2023-08-13 NOTE — Telephone Encounter (Signed)
 Ordered placed  Pt aware and voiced understanding  Reports he just saw cardiology with [facility] and imaging was complete. -unable to answer at this time as he  is in the processing of establishing care with cardiology through transplant work up   Reports they recommended paracentesis with [facility] ,however transportation plays a big role in getting the procedure at [facility], reports he's more comfortable with process,providers, procedure here at [facility]

## 2023-08-13 NOTE — Telephone Encounter (Signed)
 Patient called to request order for paracentesis  Reports fluid in belly   Unsure who manages however we have ordered in the past

## 2023-08-14 IMAGING — US IR PARACENTESIS
1 series · 1 of 1 positions shown · non-contrast
Comparison: none

INDICATION: recurrent ascites. Request is made for therapeutic paracentesis.

[Series 1: ir (person_name)/(person_name) · 1 of 1 slices shown]
[im 1/1]
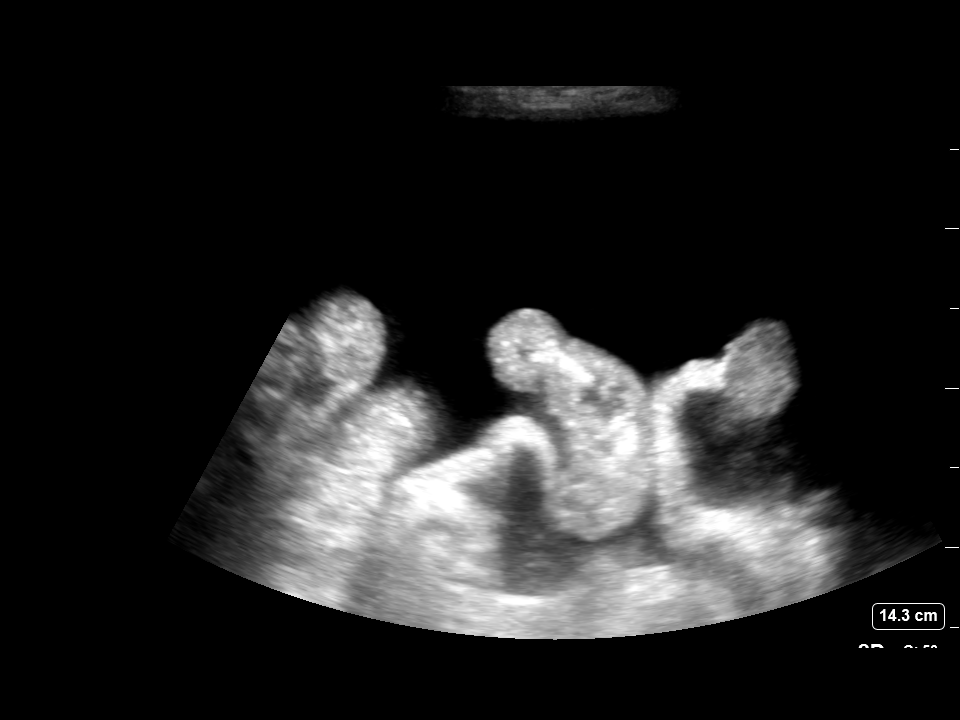

[1 of 1 positions shown; findings below may reference images not displayed]

EXAM:
ULTRASOUND GUIDED THERAPEUTIC PARACENTESIS

MEDICATIONS:
10 mL 1% lidocaine

COMPLICATIONS:
None immediate.

PROCEDURE:
Informed written consent was obtained from the patient after a
discussion of the risks, benefits and alternatives to treatment. A
timeout was performed prior to the initiation of the procedure.

Initial ultrasound scanning demonstrates a large amount of ascites
within the left lateral abdomen. The left lateral abdomen was
prepped and draped in the usual sterile fashion. 1% lidocaine was
used for local anesthesia.

Following this, a 19 gauge, 7-cm, Yueh catheter was introduced. An
ultrasound image was saved for documentation purposes. The
paracentesis was performed. The catheter was removed and a dressing
was applied. The patient tolerated the procedure well without
immediate post procedural complication.
FINDINGS: A total of approximately 5.1 liters of yellow fluid was removed.
Samples were sent to the laboratory as requested by the clinical
team.
IMPRESSION: Successful ultrasound-guided paracentesis yielding 5.1 liters of
peritoneal fluid.

## 2023-08-15 ENCOUNTER — Other Ambulatory Visit (HOSPITAL_COMMUNITY)

## 2023-08-15 ENCOUNTER — Telehealth: Payer: Self-pay

## 2023-08-15 NOTE — Telephone Encounter (Signed)
 Patient is scheduled for 08/31/23 @ 330pm  please call patient to verify

## 2023-08-16 ENCOUNTER — Other Ambulatory Visit: Payer: Self-pay

## 2023-08-16 ENCOUNTER — Ambulatory Visit (HOSPITAL_COMMUNITY)
Admission: RE | Admit: 2023-08-16 | Discharge: 2023-08-16 | Disposition: A | Discharge status: Home / Self Care | Source: Ambulatory Visit | Attending: Physician Assistant | Admitting: Physician Assistant

## 2023-08-16 DIAGNOSIS — R188 Other ascites: Secondary | ICD-10-CM | POA: Diagnosis not present

## 2023-08-16 DIAGNOSIS — N186 End stage renal disease: Secondary | ICD-10-CM | POA: Diagnosis not present

## 2023-08-16 DIAGNOSIS — M1711 Unilateral primary osteoarthritis, right knee: Secondary | ICD-10-CM

## 2023-08-16 HISTORY — PX: IR PARACENTESIS: IMG2679

## 2023-08-16 MED ORDER — LIDOCAINE-EPINEPHRINE 1 %-1:100000 IJ SOLN
INTRAMUSCULAR | Status: AC
  Filled 2023-08-16: qty 1

## 2023-08-16 NOTE — Procedures (Signed)
 PROCEDURE SUMMARY:  Successful image-guided paracentesis from the left abdomen.  Yielded 4.4 liters of yellow fluid.  No immediate complications.  EBL: zero Patient tolerated well.   Specimen not sent for labs.  Please see imaging section of Epic for full dictation.  Rileyann Florance NP 08/16/2023 9:29 AM

## 2023-08-17 DIAGNOSIS — Z940 Kidney transplant status: Secondary | ICD-10-CM | POA: Diagnosis not present

## 2023-08-17 DIAGNOSIS — D849 Immunodeficiency, unspecified: Secondary | ICD-10-CM | POA: Diagnosis not present

## 2023-08-21 DIAGNOSIS — R188 Other ascites: Secondary | ICD-10-CM | POA: Diagnosis not present

## 2023-08-21 DIAGNOSIS — K760 Fatty (change of) liver, not elsewhere classified: Secondary | ICD-10-CM | POA: Diagnosis not present

## 2023-08-21 DIAGNOSIS — I509 Heart failure, unspecified: Secondary | ICD-10-CM | POA: Diagnosis not present

## 2023-08-21 DIAGNOSIS — Z9884 Bariatric surgery status: Secondary | ICD-10-CM | POA: Diagnosis not present

## 2023-08-21 DIAGNOSIS — Z9889 Other specified postprocedural states: Secondary | ICD-10-CM | POA: Diagnosis not present

## 2023-08-21 DIAGNOSIS — Z940 Kidney transplant status: Secondary | ICD-10-CM | POA: Diagnosis not present

## 2023-08-21 DIAGNOSIS — N2889 Other specified disorders of kidney and ureter: Secondary | ICD-10-CM | POA: Diagnosis not present

## 2023-08-21 DIAGNOSIS — N170 Acute kidney failure with tubular necrosis: Secondary | ICD-10-CM | POA: Diagnosis not present

## 2023-08-21 DIAGNOSIS — T8619 Other complication of kidney transplant: Secondary | ICD-10-CM | POA: Diagnosis not present

## 2023-08-21 DIAGNOSIS — N390 Urinary tract infection, site not specified: Secondary | ICD-10-CM | POA: Diagnosis not present

## 2023-08-21 DIAGNOSIS — D849 Immunodeficiency, unspecified: Secondary | ICD-10-CM | POA: Diagnosis not present

## 2023-08-21 DIAGNOSIS — N1330 Unspecified hydronephrosis: Secondary | ICD-10-CM | POA: Diagnosis not present

## 2023-08-22 DIAGNOSIS — D849 Immunodeficiency, unspecified: Secondary | ICD-10-CM | POA: Diagnosis not present

## 2023-08-22 DIAGNOSIS — Z940 Kidney transplant status: Secondary | ICD-10-CM | POA: Diagnosis not present

## 2023-08-22 DIAGNOSIS — T8619 Other complication of kidney transplant: Secondary | ICD-10-CM | POA: Diagnosis not present

## 2023-08-22 DIAGNOSIS — N179 Acute kidney failure, unspecified: Secondary | ICD-10-CM | POA: Diagnosis not present

## 2023-08-23 DIAGNOSIS — Z940 Kidney transplant status: Secondary | ICD-10-CM | POA: Diagnosis not present

## 2023-08-23 DIAGNOSIS — D849 Immunodeficiency, unspecified: Secondary | ICD-10-CM | POA: Diagnosis not present

## 2023-08-24 ENCOUNTER — Telehealth: Payer: Self-pay | Admitting: Physician Assistant

## 2023-08-24 NOTE — Telephone Encounter (Signed)
 Pt called and reschedule for [facility] location for Duralone Injection. Please make sure Duralone is sent to [facility] before 8/29.Thanks

## 2023-08-30 DIAGNOSIS — D75839 Thrombocytosis, unspecified: Secondary | ICD-10-CM | POA: Diagnosis not present

## 2023-08-30 DIAGNOSIS — I110 Hypertensive heart disease with heart failure: Secondary | ICD-10-CM | POA: Diagnosis not present

## 2023-08-30 DIAGNOSIS — N189 Chronic kidney disease, unspecified: Secondary | ICD-10-CM | POA: Diagnosis not present

## 2023-08-30 DIAGNOSIS — Z940 Kidney transplant status: Secondary | ICD-10-CM | POA: Diagnosis not present

## 2023-08-30 DIAGNOSIS — D849 Immunodeficiency, unspecified: Secondary | ICD-10-CM | POA: Diagnosis not present

## 2023-08-30 DIAGNOSIS — I5020 Unspecified systolic (congestive) heart failure: Secondary | ICD-10-CM | POA: Diagnosis not present

## 2023-08-30 DIAGNOSIS — Z5181 Encounter for therapeutic drug level monitoring: Secondary | ICD-10-CM | POA: Diagnosis not present

## 2023-08-30 DIAGNOSIS — N179 Acute kidney failure, unspecified: Secondary | ICD-10-CM | POA: Diagnosis not present

## 2023-08-30 DIAGNOSIS — D638 Anemia in other chronic diseases classified elsewhere: Secondary | ICD-10-CM | POA: Diagnosis not present

## 2023-08-31 ENCOUNTER — Encounter (HOSPITAL_BASED_OUTPATIENT_CLINIC_OR_DEPARTMENT_OTHER): Payer: Self-pay | Admitting: Physician Assistant

## 2023-08-31 ENCOUNTER — Ambulatory Visit: Admitting: Physician Assistant

## 2023-08-31 ENCOUNTER — Ambulatory Visit (HOSPITAL_BASED_OUTPATIENT_CLINIC_OR_DEPARTMENT_OTHER): Admitting: Physician Assistant

## 2023-08-31 DIAGNOSIS — M1711 Unilateral primary osteoarthritis, right knee: Secondary | ICD-10-CM | POA: Diagnosis not present

## 2023-08-31 MED ORDER — SODIUM HYALURONATE 60 MG/3ML IX PRSY
60.0000 mg | PREFILLED_SYRINGE | INTRA_ARTICULAR | Status: AC | PRN
Start: 2023-08-31 — End: 2023-08-31
  Administered 2023-08-31: 60 mg via INTRA_ARTICULAR

## 2023-08-31 NOTE — Progress Notes (Signed)
 Office Visit Note   Patient: Vernon Blackburn           Date of Birth: 07-16-1965           MRN: [id number] Visit Date: 08/31/2023              Requested by: Keren Vicenta BRAVO, MD [address] [place],  [place] [address] PCP: Keren Vicenta BRAVO, MD  Chief Complaint  Patient presents with  . Right Knee - Injections      HPI: Patient is a pleasant 58 year old gentleman comes in today for a Durolane injection into his right knee.  Has had no new problems did have steroid injections just did not seem to last too long  Assessment & Plan: Visit Diagnoses:  1. Unilateral primary osteoarthritis, right knee     Plan: Patient injected without difficulty I explained the possible side effects of the medication may follow-up as needed  Follow-Up Instructions: Return if symptoms worsen or fail to improve.   Ortho Exam  Patient is alert, oriented, no adenopathy, well-dressed, normal affect, normal respiratory effort. Right knee no effusion no erythema compartments are soft and compressible he is neurovascular intact    Imaging: No results found. No images are attached to the encounter.  Labs: Lab Results  Component Value Date   HGBA1C 5.1 06/27/2016   REPTSTATUS 05/27/2023 FINAL 05/24/2023   GRAMSTAIN NO WBC SEEN NO ORGANISMS SEEN  05/24/2023   CULT  05/24/2023    NO GROWTH 3 DAYS Performed at [facility], [address] [place], [place] [address]    IDOLINA ROA PNEUMONIAE 04/07/2023   LABORGA KLEBSIELLA PNEUMONIAE 04/07/2023     Lab Results  Component Value Date   ALBUMIN  3.1 (L) 04/07/2023   ALBUMIN  3.2 (L) 09/06/2022   ALBUMIN  3.3 (L) 11/23/2020    Lab Results  Component Value Date   MG 2.4 07/13/2020   MG 2.5 (H) 06/24/2020   MG 1.9 07/03/2016   No results found for: VD25OH  No results found for: PREALBUMIN    Latest Ref Rng & Units 06/08/2023    5:49 PM 04/07/2023    9:30 AM 09/06/2022    5:52 PM  CBC EXTENDED  WBC 4.0  - 10.5 K/uL 3.1  0.8  4.6   RBC 4.22 - 5.81 MIL/uL 3.89  3.30  4.19   Hemoglobin 13.0 - 17.0 g/dL 89.2  8.4  87.3   HCT 60.9 - 52.0 % 35.7  27.6  40.3   Platelets 150 - 400 K/uL 357  409  220   NEUT# 1.7 - 7.7 K/uL 1.7  0.2  2.7   Lymph# 0.7 - 4.0 K/uL 0.2  0.1  0.9      There is no height or weight on file to calculate BMI.  Orders:  No orders of the defined types were placed in this encounter.  No orders of the defined types were placed in this encounter.    Procedures: Large Joint Inj on 08/31/2023 10:25 AM Indications: pain and diagnostic evaluation Details: 22 G 1.5 in needle, anterolateral approach  Arthrogram: No  Medications: 60 mg Sodium Hyaluronate 60 MG/3ML Outcome: tolerated well, no immediate complications Procedure, treatment alternatives, risks and benefits explained, specific risks discussed. Consent was given by the patient.     Clinical Data: No additional findings.  ROS:  All other systems negative, except as noted in the HPI. Review of Systems  Objective: Vital Signs: There were no vitals taken for  this visit.  Specialty Comments:  No specialty comments available.  PMFS History: Patient Active Problem List   Diagnosis Date Noted  . Unilateral primary osteoarthritis, right knee 08/31/2023  . Colon cancer screening   . Diverticulosis of colon without hemorrhage   . Grade II internal hemorrhoids   . Lipoma of colon   . Essential hypertension 07/13/2020  . Asthma 07/13/2020  . Ascites 07/12/2020  . BPH with obstruction/lower urinary tract symptoms 05/25/2020  . Abdominal wall cellulitis 01/27/2020  . ESRD on dialysis (HCC) 04/22/2019  . Long term (current) use of anticoagulants [Z79.01] 07/17/2016  . Syncope and collapse 07/14/2016  . Chest pain 07/13/2016  . Other chest pain   . Coagulopathy (HCC)   . S/P minimally invasive mitral valve repair 06/29/2016  . CHF (congestive heart failure) (HCC) 06/06/2016  . Hypertensive heart and  chronic kidney disease with heart failure and stage 1 through stage 4 chronic kidney disease, or chronic kidney disease (HCC) 02/27/2016  . Chronic systolic HF (heart failure) (HCC)   . Cardiomyopathy (HCC)   . Pulmonary HTN (HCC)   . DOE (dyspnea on exertion) 01/02/2016  . Chronic kidney disease (CKD), stage IV (severe) (HCC)   . Anemia   . Edema 11/15/2011   Past Medical History:  Diagnosis Date  . ADHD   . Anemia, chronic renal failure   . Asthma    followed by pcp  . Benign localized prostatic hyperplasia with lower urinary tract symptoms (LUTS)   . Bladder stones   . BPH (benign prostatic hyperplasia)   . Chronic gout    05-06-2020  per pt last episode 6 months ago,  (involves right knee and right great toe)  . Chronic systolic HF (heart failure) (HCC)    followed by cardiology, dr rolan @ [facility],   last TEE 03-06-2019 in epic ef 45%  . Dyspnea   . ESRD on hemodialysis [facility])    primary nephrologist--- dr prescilla--- HD on M/W/ F  at [facility] in [place]  . Heart murmur   . History of bladder stone   . History of cellulitis 01/27/2020   ED visit ,  abdominal wall  . History of CVA (cerebrovascular accident) without residual deficits    remote infarct per imaging MRI brain in care every where 02-07-2019  . History of kidney stones   . History of obstructive sleep apnea    05-06-2020  per pt test positive for osa prior to gastric bypass in 2014 used cpap,  pt stated was retested after alot of weight loss , test was negative   . Hypertension    followed by cardiology  . Kidney transplant recipient    left kidney  . NICM (nonischemic cardiomyopathy) (HCC)    followed by cardiology---- 01/ 2018  ef 20-35%, severe MV stenosis;  last TEE echo in epic 03-06-2019  ef 45%  . Peripheral edema    Bilateral lower extremity   . S/P minimally invasive mitral valve repair followed by cardiology   06-29-2016  for severe stenosis ;  30 mm Sorin Memo  3D ring annuloplasty via right mini thoracotomy approach;  last TEE echo in epic 03-06-2019 ef 45%, mild LVH, post op mild MR with mean gradient and no significant stenosis  . Seasonal and perennial allergic rhinitis   . Sleep apnea     Family History  Problem Relation Age of Onset  . Diabetes Mother   . Hypertension Mother   .  Heart failure Mother   . Colon cancer Neg Hx   . Esophageal cancer Neg Hx   . Rectal cancer Neg Hx   . Stomach cancer Neg Hx     Past Surgical History:  Procedure Laterality Date  . AV FISTULA PLACEMENT Left 03/10/2019   Procedure: LEFT ARM BASILIC ARTERIOVENOUS (AV) FISTULA CREATION FIRST STAGE;  Surgeon: Gretta Lonni PARAS, MD;  Location: [facility] OR;  Service: Vascular;  Laterality: Left;  . BASCILIC VEIN TRANSPOSITION Left 05/01/2019   Procedure: LEFT SECOND STAGE BASCILIC VEIN TRANSPOSITION.;  Surgeon: Gretta Lonni PARAS, MD;  Location: [facility] OR;  Service: Vascular;  Laterality: Left;  . BIOPSY  03/09/2021   Procedure: BIOPSY;  Surgeon: San Sandor GAILS, DO;  Location: [facility] ENDOSCOPY;  Service: Gastroenterology;;  . COLONOSCOPY WITH PROPOFOL  N/A 03/09/2021   Procedure: COLONOSCOPY WITH PROPOFOL ;  Surgeon: San Sandor GAILS, DO;  Location: [facility] ENDOSCOPY;  Service: Gastroenterology;  Laterality: N/A;  . CYSTOSCOPY/URETEROSCOPY/HOLMIUM LASER/STENT PLACEMENT Right 07/17/2018   Procedure: CYSTOSCOPY/RETROGRADE/URETEROSCOPY/HOLMIUM LASER/STENT PLACEMENT/ CYSTOLITHALOPAXY;  Surgeon: Devere Lonni Righter, MD;  Location: [facility] ORS;  Service: Urology;  Laterality: Right;  . GASTRIC BYPASS  01/2012  . IR CATHETER TUBE CHANGE  08/05/2020  . IR CATHETER TUBE CHANGE  09/07/2020  . IR CATHETER TUBE CHANGE  10/05/2020  . IR PARACENTESIS  07/13/2020  . IR PARACENTESIS  09/07/2020  . IR PARACENTESIS  09/15/2020  . IR PARACENTESIS  10/11/2020  . IR PARACENTESIS  10/28/2020  . IR PARACENTESIS  11/16/2020  . IR PARACENTESIS  12/07/2020  . IR PARACENTESIS  12/21/2020  . IR  PARACENTESIS  01/10/2021  . IR PARACENTESIS  01/27/2021  . IR PARACENTESIS  02/11/2021  . IR PARACENTESIS  03/01/2021  . IR PARACENTESIS  03/24/2021  . IR PARACENTESIS  04/08/2021  . IR PARACENTESIS  04/25/2021  . IR PARACENTESIS  05/24/2021  . IR PARACENTESIS  06/21/2021  . IR PARACENTESIS  08/08/2021  . IR PARACENTESIS  10/14/2021  . IR PARACENTESIS  11/08/2021  . IR PARACENTESIS  12/06/2021  . IR PARACENTESIS  01/26/2022  . IR PARACENTESIS  02/16/2022  . IR PARACENTESIS  05/24/2023  . IR PARACENTESIS  07/18/2023  . IR PARACENTESIS  08/16/2023  . KNEE ARTHROSCOPY W/ MENISCAL REPAIR Right 05/2008  . LAPAROSCOPIC INGUINAL HERNIA REPAIR Left 03-24-2015  @ [facility]  . MITRAL VALVE REPAIR Right 06/29/2016   Procedure: MINIMALLY INVASIVE MITRAL VALVE REPAIR  (MVR);  Surgeon: Dusty Sudie DEL, MD;  Location: [facility] OR;  Service: Open Heart Surgery;  Laterality: Right;  . PERITONEAL CATHETER INSERTION  08-18-2019  @[facility]    AND OPEN RIGHT INGUINAL HERNIA REPAIR  . PERITONEAL CATHETER REMOVAL  03-31-2020  @[facility]   . RIGHT HEART CATH N/A 10/19/2020   Procedure: RIGHT HEART CATH;  Surgeon: Rolan Ezra RAMAN, MD;  Location: [facility] INVASIVE CV LAB;  Service: Cardiovascular;  Laterality: N/A;  . RIGHT/LEFT HEART CATH AND CORONARY ANGIOGRAPHY N/A 06/07/2016   Procedure: Right/Left Heart Cath and Coronary Angiography;  Surgeon: Rolan Ezra RAMAN, MD;  Location: [facility] INVASIVE CV LAB;  Service: Cardiovascular;  Laterality: N/A;  . TEE WITHOUT CARDIOVERSION N/A 05/04/2016   Procedure: TRANSESOPHAGEAL ECHOCARDIOGRAM (TEE);  Surgeon: Rolan Ezra RAMAN, MD;  Location: [facility] ENDOSCOPY;  Service: Cardiovascular;  Laterality: N/A;  . TEE WITHOUT CARDIOVERSION N/A 06/29/2016   Procedure: TRANSESOPHAGEAL ECHOCARDIOGRAM (TEE);  Surgeon: Dusty Sudie DEL, MD;  Location: [facility] OR;  Service: Open Heart Surgery;  Laterality: N/A;  . TEE WITHOUT CARDIOVERSION N/A 03/06/2019   Procedure: TRANSESOPHAGEAL ECHOCARDIOGRAM (TEE);  Surgeon: Rolan Ezra RAMAN,  MD;  Location: [facility] ENDOSCOPY;  Service: Cardiovascular;  Laterality: N/A;  . TRANSURETHRAL RESECTION OF PROSTATE N/A 05/25/2020   Procedure: TRANSURETHRAL RESECTION OF THE PROSTATE (TURP) WITH CYSTOLITHOLAPAXY;  Surgeon: Devere Lonni Righter, MD;  Location: [facility] ORS;  Service: Urology;  Laterality: N/A;  . UMBILICAL HERNIA REPAIR  09-05-2012  @[facility]   . URETHROPLASTY RECONSTRUCTION / REPAIR PROSTATIC / MEMBRANOUS URETHRA  01/13/2021   [facility]   Social History   Occupational History  . Not on file  Tobacco Use  . Smoking status: Never  . Smokeless tobacco: Never  Vaping Use  . Vaping status: Never Used  Substance and Sexual Activity  . Alcohol use: Not Currently  . Drug use: Never  . Sexual activity: Not on file

## 2023-09-04 DIAGNOSIS — E7800 Pure hypercholesterolemia, unspecified: Secondary | ICD-10-CM | POA: Diagnosis not present

## 2023-09-04 DIAGNOSIS — T8619 Other complication of kidney transplant: Secondary | ICD-10-CM | POA: Diagnosis not present

## 2023-09-04 DIAGNOSIS — Z23 Encounter for immunization: Secondary | ICD-10-CM | POA: Diagnosis not present

## 2023-09-04 DIAGNOSIS — E213 Hyperparathyroidism, unspecified: Secondary | ICD-10-CM | POA: Diagnosis not present

## 2023-09-04 DIAGNOSIS — I10 Essential (primary) hypertension: Secondary | ICD-10-CM | POA: Diagnosis not present

## 2023-09-04 DIAGNOSIS — D849 Immunodeficiency, unspecified: Secondary | ICD-10-CM | POA: Diagnosis not present

## 2023-09-04 DIAGNOSIS — Z940 Kidney transplant status: Secondary | ICD-10-CM | POA: Diagnosis not present

## 2023-09-04 NOTE — Progress Notes (Signed)
 [facility]  Post-Transplant Follow-Up Telehealth Visit  NON KIDNEY TRANSPLANT PROVIDERS Keren Vicenta BRAVO, MD SITE OF VISIT Telehealth   CHIEF COMPLAINT Kidney Transplant Follow-up   TELEHEALTH This video encounter was conducted with the patient's (or proxy's) verbal consent via secure, interactive audio and video telecommunications while in clinic/office/hospital.  The patient (or proxy) was instructed to have this encounter in a suitably private space and to only have persons present to whom they give permission to participate. In addition, patient identity was confirmed by use of name plus two identifiers.  This visit was coded based on medical decision making (MDM).    HISTORY OF PRESENT ILLNESS Vernon Blackburn is a 58 y.o. male with a history of ESRD secondary to unverified etiology, presumed hypertension, historical dialysis dependence since 02/2019, status post TURP on 05/25/2020, mitral valve repair in 2018, gout, heart failure with a reduced ejection fraction, asthma, gastric bypass, prior CVA, nephrolithiasis, calcium  oxalate stones, prior history of ascites requiring paracentesis, status post urethroplasty in 05/2021 and hydrocele repair in 06/2021. Now s/p LDKT 11/08/2022 (left kidney, standard anatomy, CIT of 11:18, WIT of 0:24, Campath induction, PRA of 11/0, CMV recipient+, EBV recipient+). The intraoperative course was notable for hypotension requiring dopamine  and bladder spasms requiring oxybutynin .  Posttransplant course notable for JP drain replacement 11/2022 and ongoing perinephric fluid collections.  Readmission 11/2018 for for diarrhea and urinary retention status post PCN and US  and Foley placement and urine leak.  PCN subsequently removed 04/2023 ureteral stricture with balloon dilation.  Klebsiella UTI 04/2023.  Ongoing ascites evaluated by cardiology and hepatology thought to be due to constrictive cardiac physiology  INTERVAL HISTORY  Recently  admitted to the hospital 8/19 - 08/22/2023 for dysuria and fatigue.  Had extensive infectious workup including negative blood cultures, negative urine cultures, negative CT scan.  Had AKI with creatinine up to 2.1 although in the setting of a large-volume paracentesis.  Less than 5 L so no albumin  replacement was given.  Had elevated inflammatory markers and a thrombocytosis indicating that he was likely inflamed although reason not unclear.  Discharged with stable creatinine at 2.1 mg/dL and continued on his prior immunosuppression.  Since that time he has had hematuria as noted below. He has not fevers or chills, he feels well otherwise and like his normal self  Graft: His creatinine nadir appears to be in the 1.7-2.0 range.  Creatinine is up to 2.1 on recent hospitalization but down to 2.0 on follow-up labs 4 days ago.  Urine studies at that time showed ongoing hematuria and microscopic pyuria.  Urine protein increased to 896 mg/g on spot check up from a baseline more in the 300-500 mg/g range.  HLA last checked 07/20/2023 PRA/0 with no DSA.  Last AlloSure checked 05/03/2023 was 0.49.  Denies issues eating and drinking  Hematuria: Reached out to his coordinator 8/28 noting that he had 3 days of hematuria which was new for him. Came on suddenly, sxs are still continuing. Fluctuates in the intensity of color. Denies issues issues stopping and starting, no dysuria. Urine can completely clear. Ucx with mixed urogenital flora. Following with urology.  Does have a complicated posttransplant GU course with PCN for ureteral stricture status post balloon dilation and NUS.  Last seen by urology 06/12/2023 for urodynamic studies that was most concerning for detrusor muscle overactivity and decreased bladder capacity  Ascites: Evaluated by hepatology and cardiology.  Thought to be due to restrictive cardiac physiology and preference for ascites rather than peripheral edema  due to injury in the past to his peritoneal  lining..  Workup continuing ongoing with local cardiologist in conjunction with [facility]. He feels that he needs another paracentesis and abd has swollen. He has a plans to follow with local cardiology 10/2. Is awaiting message back from cardiology  Anemia: Iron  studies last completed 08/22/2023 ferritin replete at 373 oh low percent saturation 7%.  He is on iron  supplementation as well as folate supplementation for prior low folate.   Immunosuppression: Belatacept  q. 28 days, next due 9/12 CellCept 750 mg every 12 hours, was recently reduced but increase Prednisone  5 mg daily  Blood pressure:  Bps 120/70s, no orthostasis. On coreg  3.125 and lasix  40 BID. He is still having a response to his lasix  dose. Drinking 72oz  Prophylaxis: Remains on Bactrim DS MWF, aspirin  81 mg daily, famotidine  20 mg twice daily.  Completed CMV prophylaxis.  CMV and BK have been historically   ASSESSMENT AND PLAN  Status post living donor kidney transplant 11/08/2022 Posttransplant course has been complicated by ureteral stricture with PCN and US  status post removal, cardiac ascites and perinephric fluid collections Clearance: Baseline creatinine appears to be in the 1.7-2.0 mg/dL most recent labs consistent with this.  Not particularly surprised by his baseline noting his numerous obstructive and hemodynamic insults to this for 6 months following transplant Urine: Urine studies show ongoing microscopic hematuria and pyuria and with a recent increase to around 900 mg/g of proteinuria on spot check from a baseline of 300-500 mg.  This can be attributed to his recent hematuria at this time HLA: Last checked 07/2023 PRA/0 without DSA Cell free DNA: AlloSure checked 05/2023 0.49 Update RFP, urine chemistries, HLA and lieu of kidney biopsy for surveillance with his next belatacept  infusion Kidney function is okay at this point although his baseline is not quite low we would like but is reasonable given the numerous  hemodynamic insults he sustained early in his posttransplant course.  Hematuria Complicated urologic course including ureteral stricture requiring PCN and NUS.  Appears that his stent has been removed on review of his CT scan recently.  He also has a history of nephrolithiasis, TURP and a urethroplasty and hydrocele repair of his native ureter in 2023.  Noting his stable immunosuppression and stable creatinine with only slight increase in proteinuria commensurate with his RBCs it seems overwhelmingly likely that the cause of his hematuria is urologic. I will reach out to his urologist and transplant surgeon Update CBC with next set of labs, stable on recent check Told to reach out to his coordinator should his hematuria become more persistent or pronounced  Immunosuppression Belatacept  q. 28 days, next infusion 9/12 CellCept 750 mg every 12 hours Prednisone  5 mg daily  Prophylaxis PJP: Bactrim DS MWF for 1 year Aspirin  81 mg daily Famotidine  20 mg twice Ehly CMV: Completed routine prophylaxis, continue screening through 1 year  Ascites Evaluated by both [facility] and cardiology.  Hepatology was not concerned for a liver cause of his ascites and it is thought that he has a constrictive physiology with a leaky peritoneum where he preferentially pulls ascites rather than lower extremity edema.   Following up with cardiology locally There has been discussion about pursuing further cardiac imaging and right or left heart cath for more in-depth pressure evaluation Appreciate cardiology's input, deferring management to them See discussion below regarding not increasing diuretics at this time I advised that should he undergo large-volume paracentesis that he should get albumin  replacement regardless  of the volume removed  Blood Pressure/Volume Current regimen: Coreg  3.125 mg every 12 hours, Lasix  40 mg twice daily Blood pressure in the 120s over 70s, no changes to antihypertensive  regimen Hypervolemic and ascites reportedly worsening over the past couple weeks since hospital discharge We discussed the utility of changing his diuretic regimen.  At this time with his creatinine improving slowly, ongoing hematuria we elected not to make a change despite his reported worsening ascites.  Can increase his diuretic regiment if we need to although I encouraged him to get the data from his cardiologist in early October prior to doing this if I can wait  Anemia History of both iron  deficiency and folate deficiency Continue ferrous sulfate  Continue folate supplement Update CBC with next labs  Health Maintenance  Up-to-date on routine posttransplant cancer screening Cholesterol: Will update cholesterol labs with his next infusion     No results found for: CHOLTOTAL, LDLCALC, HDL, TRIG BMM: Will update these labs with his next infusion      Lab Results  Component Value Date   CALCIUM  8.8 08/23/2023   PHOS 3.2 08/23/2023          No results found for: VITD Immunizations:       Immunization History  Administered Date(s) Administered  . HEP B (>=66YR) VACCINE (HEPLISAV-B) 09/09/2019, 10/27/2019  . HEP B (>=56YR) VACCINE (ENGERIX-B/RECOMBIVAX) 03/19/2019, 04/23/2019, 11/09/2022, 11/16/2022, 11/28/2022  . Influenza IIV4, IM pres-free 09/29/2020  . Influenza, H1N1-09, unspecified 09/18/2022  . Influenza, IM unspecified 01/03/2016, 10/03/2019, 02/06/2020  . Influenza, recombinant (Egg-Free) IM PF (18 Yr+) (Flublok Quad) 10/10/2021  . PNEUMOCOCCAL (PCV13) (BIRTH-80YR) VACCINE (PREVNAR 13) 03/03/2019  . PNEUMOCOCCAL (PPSV23)(>=63YRS -OR- >=2 YRS WITH RISK) VACCINE (PNEUMOVAX 23 ) 01/03/2016, 02/06/2020, 08/22/2021, 04/15/2022  . Pneumococcal, unspecified 02/06/2020  . RZV(>=97YR -OR-19+YRS IF  IMMCOMP) VACCINE (SHINGRIX) 02/28/2022, 05/18/2022    Recent Labs    01/04/23 1406 01/11/23 1220 01/31/23 1008 01/31/23 1538 02/05/23 1305 02/14/23 1235 02/27/23 1104  03/22/23 1024 04/24/23 1447 05/03/23 1001 05/07/23 0951 05/25/23 1225 06/12/23 0759 06/14/23 1219 06/22/23 1347 07/20/23 1315 08/17/23 0908 08/21/23 1021 08/22/23 0516 08/22/23 0517 08/23/23 0625 08/30/23 1601  [id number]  --   --   --  <1.0 <1.0  --  <1.0  --   --   --   --   --   --   --   --   --   --   --   --   --   --   --   CREATININE 2.0*   < > 1.9* 2.0* 1.7*   < >  --    < > 1.6* 1.5*  --  1.7*  --  1.6* 1.7* 1.7* 2.1* 2.1*  --  2.1* 2.1*  --   TPCRERATIO 3,228*   < >  --   --   --   --  1,901*   < > 598* 569*  --  472*  --  459* 393* 378* 401* 552*  --   --   --   --   K  --    < > 3.0* 3.3* 2.6*   < >  --    < > 3.1* 3.6  --  3.3*  --  4.0 2.8* 3.8 3.9 3.2*  --  3.2* 3.4*  --   WBC 6.6   < > 4.2 3.3 3.9   < > 3.0*   < > 6.8 3.9   < > 3.4   < > 3.1* 3.2 4.5  4.1 4.2 3.5  --  3.4 4.3  HGB 9.4*   < > 9.4* 8.8* 9.8*   < > 9.9*   < > 8.5* 8.7*  --  11.2*  --  10.7* 10.2* 8.8* 9.6* 10.0* 9.0*  --  8.6*  --   PLT 352   < > 324 290 364   < > 426   < > 338 373   < > 409   < > 283 342 478* 659* 639* 589*  --  515* 380  CMVDNA Not Detected   < >  --  Not Detected  --    < > Not Detected   < > Not Detected Not Detected  --  Not Detected  --   --  DETECTED* DETECTED* DETECTED* DETECTED*  --   --   --   --   BKPCRQN Not Detected   < >  --  Not Detected  --    < > Not Detected   < > Not Detected Not Detected  --  Not Detected  --   --  Not Detected Not Detected Not Detected  --   --   --   --   --   RAPAMYC <1.0*  --  <1.0* <1.0*  --   --  <1.0*  --   --   --   --   --   --   --   --   --   --   --   --   --   --   --    < > = values in this interval not displayed.     INSTRUCTIONS If you undergo paracentesis, would recommend replacement albumin  50g regardless of amount of fluid taken off Due for flu and ovid vaccine updates I will reach out to your surgery team and urology team about the blood in your urine Labs today We will call you when your labs return to let you know about any needed  changes to your medications, until the continue taking your medications as prescribed Please reach out to your coordinator should you have questions or issues obtaining medicines Return in 2 weeks as previously scheduled Please obtain lab work locally every 2 weeks   REVIEW OF SYSTEMS A 12-point review of systems was performed and is detailed below. . Constitutional: negative . Ears, Nose, Mouth, and Throat: negative . Cardiovascular: negative . Respiratory: negative . Gastrointestinal: negative  . Genitourinary: negative . Musculoskeletal: negative . Integumentary: negative . Neurological: negative . Hematology: negative  . Infectious Disease: negative . Endocrine: negative  PAST MEDICAL HISTORY Past Medical History:  Diagnosis Date  . Acute renal failure superimposed on chronic kidney disease () 08/25/2019  . Anemia in chronic kidney disease 02/11/2019  . Asymmetric SNHL (sensorineural hearing loss) 12/04/2017  . Cardiomyopathy (CMS/HHS-HCC) 08/25/2019  . CHF (congestive heart failure) (CMS/HHS-HCC) 06/06/2016  . COPD (chronic obstructive pulmonary disease) (CMS/HHS-HCC)   . ESRD on dialysis (CMS/HHS-HCC) 04/22/2019  . Gout 04/28/2011  . H/O gastric bypass 07/22/2013  . Heart disease Unknown  . Hepatic steatosis 08/29/2019  . Hyperkalemia 08/29/2019  . Hypertension 04/28/2011  . Iron  deficiency anemia, unspecified 02/11/2019  . Kidney disease   . Nephrolithiasis    PROBLEM LIST Problem List  Date Reviewed: 07/27/2023        ICD-10-CM Priority Class Noted - Resolved Diagnosed   Dysuria R30.0   08/22/2023 - Present    Elevated serum creatinine R79.89   08/22/2023 - Present  Pyuria R82.81   08/22/2023 - Present    Ascites R18.8   08/22/2023 - Present    Anasarca R60.1   08/22/2023 - Present    Right leg swelling M79.89   08/22/2023 - Present    Thrombocytosis D75.839   08/22/2023 - Present    Erectile dysfunction due to arterial insufficiency N52.01   05/03/2023 - Present     Exposure to blood Z77.21   04/06/2023 - Present    Neutropenia () D70.9   03/28/2023 - Present    GERD (gastroesophageal reflux disease) K21.9   01/17/2023 - Present    Urine leak from transplanted ureter T86.898   12/05/2022 - Present    Displacement of ureteral stent () T83.122A   11/28/2022 - Present    S/P living-donor kidney transplantation (HHS-HCC) Z94.0   11/09/2022 - Present    Overview Signed 11/09/2022  4:30 AM by Cathryne Belton Norlene Donnamae, PA  Date of Transplant = 11/08/2022  ABO (Donor/Recipient) = compatible Blood type: O/O DonorType: Living donor Allograft type:Left kidney KDPI = N/A Donor anatomy: single artery, vein and ureter Donor Kidney Bx: Not applicable Allograft injury/complications: None Nephroureteral stent: placed at the time of surgery  Pump #s:Not applicable  Pump duration =   N/A Cold ischemia time: 11 hours 18 minutes Warm ischemia time:  24 minutes  Induction agent Campath PRA pre-transplant = Class I 11% and Class II 0%  CMV D/R: ?/+ EBV D/R:  ?/+ PHS IR: not applicable      Immunosuppressed status (CMS/HHS-HCC) D84.9   11/09/2022 - Present    Prophylactic antibiotic Z79.2   11/09/2022 - Present    Status post placement of ureteral stent Z96.0   11/09/2022 - Present    HBV positive in cadaveric donor Z00.5, B19.10   11/09/2022 - Present    Hydronephrosis of kidney transplant (HHS-HCC) T86.19, N13.30   11/09/2022 - Present    Bladder spasms N32.89   11/09/2022 - Present    Hemodialysis-associated hypotension I95.3   10/27/2022 - Present    S/P mitral valve repair Z98.890   05/26/2022 - Present    Urethral obstruction N36.8   05/19/2021 - Present    Hyperkalemia E87.5   08/29/2019 - Present    Hepatic steatosis K76.0   08/29/2019 - Present    Acute renal failure superimposed on chronic kidney disease () N17.9, N18.9   08/25/2019 - Present    Cardiomyopathy (CMS/HHS-HCC) I42.9   08/25/2019 - Present    Anemia in chronic kidney disease N18.9, D63.1    02/11/2019 - Present    Iron  deficiency anemia, unspecified D50.9   02/11/2019 - Present    Asymmetric SNHL (sensorineural hearing loss) H90.3   12/04/2017 - Present    CHF (congestive heart failure) (CMS/HHS-HCC) I50.9   06/06/2016 - Present    H/O bariatric surgery Z98.84   07/22/2013 - Present    Abnormal ECG R94.31   05/25/2011 - Present    Gout M10.9   04/28/2011 - Present    Hypertension I10   04/28/2011 - Present    RESOLVED: Bacteremia due to Klebsiella pneumoniae R78.81, B96.1   04/12/2023 - 04/13/2023    RESOLVED: Neutropenic fever () D70.9, R50.81   04/08/2023 - 04/13/2023    RESOLVED: Kidney transplant candidate Z76.82   11/08/2022 - 11/09/2022    RESOLVED: Pre-transplant evaluation for kidney transplant Z01.818   11/14/2019 - 11/23/2022    RESOLVED: ESRD on dialysis (CMS/HHS-HCC) N18.6, Z99.2   04/22/2019 - 11/09/2022     PAST SURGICAL  HISTORY Past Surgical History:  Procedure Laterality Date  . TRANSPLANT KIDNEY N/A 11/08/2022   Procedure: ADULT, RENAL ALLOTRANSPLANTATION, IMPLANTATION OF GRAFT; WITHOUT RECIPIENT NEPHRECTOMY;  Surgeon: Jaquelyn Rosette Fanti, MD;  Location: [facility];  Service: General Surgery;  Laterality: N/A;  . CARDIAC SURGERY    . CREATION ARTERIOVENOUS FISTULA    . HERNIA REPAIR  07/2017  . sleeve gastric bypass     SOCIAL HISTORY Social History   Socioeconomic History  . Marital status: Married    Spouse name: Vernon Blackburn  . Number of children: 2  Tobacco Use  . Smoking status: Never  . Smokeless tobacco: Never  Vaping Use  . Vaping status: Never Used  Substance and Sexual Activity  . Alcohol use: Not Currently  . Drug use: Never  . Sexual activity: Defer    Partners: Female    Birth control/protection: Post-menopausal   Social Drivers of Corporate investment banker Strain: Low Risk  (08/22/2023)   Overall Financial Resource Strain (CARDIA)   . Difficulty of Paying Living Expenses: Not hard at all  Food Insecurity: No Food Insecurity (08/22/2023)   Hunger  Vital Sign   . Worried About Programme researcher, broadcasting/film/video in the Last Year: Never true   . Ran Out of Food in the Last Year: Never true  Transportation Needs: No Transportation Needs (08/22/2023)   PRAPARE - Transportation   . Lack of Transportation (Medical): No   . Lack of Transportation (Non-Medical): No    FAMILY HISTORY Family History  Problem Relation Name Age of Onset  . Diabetes Mother    . Heart failure Mother    . ESRD-Dialysis Mother    . Aneurysm Father    . Cerebral aneurysm Father    . No Known Problems Son    . No Known Problems Daughter      CURRENT MEDICATIONS Outpatient Encounter Medications as of 09/04/2023  Medication Sig Dispense Refill  . acetaminophen  (TYLENOL ) 325 MG tablet Take 2 tablets (650 mg total) by mouth every 6 (six) hours as needed for Pain 100 tablet 0  . albuterol  MDI, PROVENTIL , VENTOLIN , PROAIR , HFA (VENTOLIN  HFA) 90 mcg/actuation inhaler Inhale 2 inhalations into the lungs every 4 (four) hours as needed for Wheezing or Shortness of Breath 18 g 3  . aspirin  81 MG EC tablet Take 1 tablet (81 mg total) by mouth once daily 120 tablet 0  . atorvastatin  (LIPITOR) 20 MG tablet Take 1 tablet (20 mg total) by mouth once daily 90 tablet 1  . blood glucose diagnostic (ACCU-CHEK GUIDE TEST STRIPS) test strip Use 1 each (1 strip total) once daily as instructed. 50 each 2  . carvediloL  (COREG ) 3.125 MG tablet Take 1 tablet (3.125 mg total) by mouth 2 (two) times daily - do not take if the top number of your blood pressure is less than 110 60 tablet 1  . docusate (COLACE) 50 MG capsule Take 50 mg by mouth 2 (two) times daily    . famotidine  (PEPCID ) 20 MG tablet Take 1 tablet (20 mg total) by mouth 2 (two) times daily 60 tablet 6  . ferrous sulfate  324 mg (65 mg iron ) EC tablet Take 1 tablet (324 mg total) by mouth daily with breakfast (Patient taking differently: Take 325 mg by mouth 2 (two) times daily) 90 tablet 3  . fexofenadine (ALLEGRA) 180 MG tablet Take 180 mg by  mouth once daily    . fluticasone  furoate-vilanteroL (BREO ELLIPTA ) 200-25 mcg/dose DsDv Inhale 1 Puff  into the lungs once daily 60 each 1  . folic acid  (FOLVITE ) 1 MG tablet Take 1 tablet (1 mg total) by mouth once daily 30 tablet 2  . FUROsemide  (LASIX ) 20 MG tablet Take 2 tablets (40 mg total) by mouth 2 (two) times daily 120 tablet 11  . lancets (ACCU-CHEK SOFTCLIX LANCETS) Use 1 each once daily Use as instructed. 100 each 2  . lisdexamfetamine (VYVANSE ) 30 MG capsule Take 2 capsules (60 mg total) by mouth every morning for 30 days 60 capsule 0  . melatonin 3 mg tablet Take 3 tablets (9 mg total) by mouth at bedtime 90 tablet 3  . multivitamin tablet Take 1 tablet by mouth once daily    . mycophenolate  (CELLCEPT) 250 mg capsule Take 2 capsules (500 mg total) by mouth every 12 (twelve) hours 120 capsule 11  . pantoprazole  (PROTONIX ) 20 MG DR tablet Take 1 tablet (20 mg total) by mouth once daily 30 tablet 11  . predniSONE  (DELTASONE ) 5 MG tablet Take 1 tablet (5 mg total) by mouth once daily 30 tablet 11  . sennosides-docusate (SENOKOT-S) 8.6-50 mg tablet Take 2 tablets by mouth 2 (two) times daily 120 tablet 1  . sodium chloride  0.9% SolP 100 mL with belatacept  250 mg SolR 475 mg IVPB Inject 475 mg into the vein every 28 (twenty-eight) days  Refrigerate. Protect from light. Administer via 1.2 micron filter extension set.    . sulfamethoxazole-trimethoprim (BACTRIM DS) 800-160 mg tablet Take 1 tablet (160 mg of trimethoprim total) by mouth every Monday, Wednesday, and Friday 13 tablet 11   No facility-administered encounter medications on file as of 09/04/2023.    ALLERGIES Allergies  Allergen Reactions  . Isosorbide  Other (See Comments)    Hypotension      PHYSICAL EXAM         . Deferred: Not performed given tele-heath visit   RESULTS As outlined above.   CONSULTATION AND COLLABORATION I have trended the patient's SCr, hg, WBC, anti-rejection levels (if available), lipid panel,  iPTH, 25 vitamin D , urinalysis, spot protein to creatinine, CMV & BK screens.  I have reviewed the last two clinic notes. I have reviewed he immunization history, BMI, smoking history, & age appropriate cancer screening. These results have been edited in his/her electronic record.  FUTURE ORDERS No orders of the defined types were placed in this encounter.   FUTURE APPOINTMENTS  Future Appointments     Date/Time Provider Department Center Visit Type   09/14/2023 9:15 AM (Arrive by 9:00 AM) CLINIC 2A TREATMENT SCHEDULE [facility] INFUSION 180   09/21/2023 10:20 AM (Arrive by 9:50 AM) RENAL TRANSPLANT PROVIDER 1 [facility] TRANSPLANT FOLLOW-UP   09/28/2023 1:00 PM (Arrive by 12:45 PM) Lucille Monita Ee, MD [facility] V[facility] V[facility] RETURN        Attestation Statement:   I personally performed the service. (TP)  MICAH HELENE LATINO, MD PROVIDER [facility] HELENE LATINO, MD   I spent a total of 45 minutes minutes face-to-face with the patient, over half of which was spent counseling and/or coordination of care related to diagnosis, lab results, treatment options, follow up plan, and return instructions for the patient's kidney transplant .

## 2023-09-05 ENCOUNTER — Telehealth (HOSPITAL_COMMUNITY): Payer: Self-pay | Admitting: Cardiology

## 2023-09-05 NOTE — Telephone Encounter (Signed)
 Pt aware and voiced understanding

## 2023-09-05 NOTE — Telephone Encounter (Signed)
 Pt called to request another order for paracentesis, reports he was told by transplant team procedure should also include albumin     Please advise

## 2023-09-07 ENCOUNTER — Other Ambulatory Visit (HOSPITAL_COMMUNITY): Payer: Self-pay | Admitting: Physician Assistant

## 2023-09-07 ENCOUNTER — Other Ambulatory Visit: Payer: Self-pay

## 2023-09-07 DIAGNOSIS — R188 Other ascites: Secondary | ICD-10-CM

## 2023-09-10 ENCOUNTER — Ambulatory Visit (HOSPITAL_COMMUNITY)
Admission: RE | Admit: 2023-09-10 | Discharge: 2023-09-10 | Disposition: A | Discharge status: Home / Self Care | Source: Ambulatory Visit | Attending: Physician Assistant | Admitting: Physician Assistant

## 2023-09-10 ENCOUNTER — Other Ambulatory Visit (HOSPITAL_COMMUNITY): Payer: Self-pay

## 2023-09-10 DIAGNOSIS — Z940 Kidney transplant status: Secondary | ICD-10-CM | POA: Diagnosis not present

## 2023-09-10 DIAGNOSIS — Z87898 Personal history of other specified conditions: Secondary | ICD-10-CM | POA: Diagnosis not present

## 2023-09-10 DIAGNOSIS — R188 Other ascites: Secondary | ICD-10-CM | POA: Insufficient documentation

## 2023-09-10 HISTORY — PX: IR PARACENTESIS: IMG2679

## 2023-09-10 MED ORDER — ALBUMIN HUMAN 25 % IV SOLN
INTRAVENOUS | Status: AC
  Filled 2023-09-10: qty 200

## 2023-09-10 MED ORDER — LIDOCAINE-EPINEPHRINE (PF) 1 %-1:200000 IJ SOLN
20.0000 mL | Freq: Once | INTRAMUSCULAR | Status: AC
Start: 2023-09-10 — End: 2023-09-10
  Administered 2023-09-10: 10 mL

## 2023-09-10 MED ORDER — LIDOCAINE-EPINEPHRINE 1 %-1:100000 IJ SOLN
INTRAMUSCULAR | Status: AC
  Filled 2023-09-10: qty 1

## 2023-09-10 MED ORDER — ALBUMIN HUMAN 25 % IV SOLN
50.0000 g | Freq: Once | INTRAVENOUS | Status: AC
  Administered 2023-09-10: 50 g via INTRAVENOUS

## 2023-09-10 MED ORDER — CARVEDILOL 6.25 MG PO TABS: 6.2500 mg | ORAL_TABLET | Freq: Two times a day (BID) | ORAL | 3 refills | Status: AC

## 2023-09-10 NOTE — Procedures (Signed)
 PROCEDURE SUMMARY:  Successful image-guided paracentesis from the left abdomen.  Yielded 3.0 liters of clear, straw-colored peritoneal fluid.  No immediate complications.  EBL: zero Patient tolerated well.   Please see imaging section of Epic for full dictation.  Trammell Bowden A Kendel Pesnell PA-C 09/10/2023 2:20 PM

## 2023-09-12 DIAGNOSIS — T8619 Other complication of kidney transplant: Secondary | ICD-10-CM | POA: Diagnosis not present

## 2023-09-12 DIAGNOSIS — N200 Calculus of kidney: Secondary | ICD-10-CM | POA: Diagnosis not present

## 2023-09-12 DIAGNOSIS — R188 Other ascites: Secondary | ICD-10-CM | POA: Diagnosis not present

## 2023-09-12 DIAGNOSIS — N35913 Unspecified membranous urethral stricture, male: Secondary | ICD-10-CM | POA: Diagnosis not present

## 2023-09-12 DIAGNOSIS — N1330 Unspecified hydronephrosis: Secondary | ICD-10-CM | POA: Diagnosis not present

## 2023-09-14 DIAGNOSIS — R809 Proteinuria, unspecified: Secondary | ICD-10-CM | POA: Diagnosis not present

## 2023-09-14 DIAGNOSIS — D84821 Immunodeficiency due to drugs: Secondary | ICD-10-CM | POA: Diagnosis not present

## 2023-09-14 DIAGNOSIS — E213 Hyperparathyroidism, unspecified: Secondary | ICD-10-CM | POA: Diagnosis not present

## 2023-09-14 DIAGNOSIS — D849 Immunodeficiency, unspecified: Secondary | ICD-10-CM | POA: Diagnosis not present

## 2023-09-14 DIAGNOSIS — E8770 Fluid overload, unspecified: Secondary | ICD-10-CM | POA: Diagnosis not present

## 2023-09-14 DIAGNOSIS — Z79899 Other long term (current) drug therapy: Secondary | ICD-10-CM | POA: Diagnosis not present

## 2023-09-14 DIAGNOSIS — R109 Unspecified abdominal pain: Secondary | ICD-10-CM | POA: Diagnosis not present

## 2023-09-14 DIAGNOSIS — I10 Essential (primary) hypertension: Secondary | ICD-10-CM | POA: Diagnosis not present

## 2023-09-14 DIAGNOSIS — E7800 Pure hypercholesterolemia, unspecified: Secondary | ICD-10-CM | POA: Diagnosis not present

## 2023-09-14 DIAGNOSIS — D649 Anemia, unspecified: Secondary | ICD-10-CM | POA: Diagnosis not present

## 2023-09-14 DIAGNOSIS — T8619 Other complication of kidney transplant: Secondary | ICD-10-CM | POA: Diagnosis not present

## 2023-09-14 DIAGNOSIS — Z940 Kidney transplant status: Secondary | ICD-10-CM | POA: Diagnosis not present

## 2023-09-14 DIAGNOSIS — R188 Other ascites: Secondary | ICD-10-CM | POA: Diagnosis not present

## 2023-09-14 DIAGNOSIS — N132 Hydronephrosis with renal and ureteral calculous obstruction: Secondary | ICD-10-CM | POA: Diagnosis not present

## 2023-09-14 DIAGNOSIS — Z87442 Personal history of urinary calculi: Secondary | ICD-10-CM | POA: Diagnosis not present

## 2023-09-14 DIAGNOSIS — Z7952 Long term (current) use of systemic steroids: Secondary | ICD-10-CM | POA: Diagnosis not present

## 2023-09-14 DIAGNOSIS — Z23 Encounter for immunization: Secondary | ICD-10-CM | POA: Diagnosis not present

## 2023-09-14 DIAGNOSIS — Z79624 Long term (current) use of inhibitors of nucleotide synthesis: Secondary | ICD-10-CM | POA: Diagnosis not present

## 2023-09-14 DIAGNOSIS — N135 Crossing vessel and stricture of ureter without hydronephrosis: Secondary | ICD-10-CM | POA: Diagnosis not present

## 2023-09-14 DIAGNOSIS — Z7982 Long term (current) use of aspirin: Secondary | ICD-10-CM | POA: Diagnosis not present

## 2023-09-17 DIAGNOSIS — N1330 Unspecified hydronephrosis: Secondary | ICD-10-CM | POA: Diagnosis not present

## 2023-09-17 DIAGNOSIS — Z940 Kidney transplant status: Secondary | ICD-10-CM | POA: Diagnosis not present

## 2023-09-21 ENCOUNTER — Ambulatory Visit: Admitting: Physician Assistant

## 2023-09-21 DIAGNOSIS — Z01818 Encounter for other preprocedural examination: Secondary | ICD-10-CM | POA: Diagnosis not present

## 2023-09-21 DIAGNOSIS — N200 Calculus of kidney: Secondary | ICD-10-CM | POA: Diagnosis not present

## 2023-09-21 DIAGNOSIS — N368 Other specified disorders of urethra: Secondary | ICD-10-CM | POA: Diagnosis not present

## 2023-09-21 DIAGNOSIS — I10 Essential (primary) hypertension: Secondary | ICD-10-CM | POA: Diagnosis not present

## 2023-09-21 DIAGNOSIS — Z940 Kidney transplant status: Secondary | ICD-10-CM | POA: Diagnosis not present

## 2023-09-21 DIAGNOSIS — N138 Other obstructive and reflux uropathy: Secondary | ICD-10-CM | POA: Diagnosis not present

## 2023-09-21 DIAGNOSIS — Z960 Presence of urogenital implants: Secondary | ICD-10-CM | POA: Diagnosis not present

## 2023-09-21 DIAGNOSIS — N132 Hydronephrosis with renal and ureteral calculous obstruction: Secondary | ICD-10-CM | POA: Diagnosis not present

## 2023-09-21 DIAGNOSIS — N1832 Chronic kidney disease, stage 3b: Secondary | ICD-10-CM | POA: Diagnosis not present

## 2023-09-21 DIAGNOSIS — D84821 Immunodeficiency due to drugs: Secondary | ICD-10-CM | POA: Diagnosis not present

## 2023-09-21 DIAGNOSIS — Z9889 Other specified postprocedural states: Secondary | ICD-10-CM | POA: Diagnosis not present

## 2023-09-21 DIAGNOSIS — D899 Disorder involving the immune mechanism, unspecified: Secondary | ICD-10-CM | POA: Diagnosis not present

## 2023-09-21 DIAGNOSIS — I130 Hypertensive heart and chronic kidney disease with heart failure and stage 1 through stage 4 chronic kidney disease, or unspecified chronic kidney disease: Secondary | ICD-10-CM | POA: Diagnosis not present

## 2023-09-21 DIAGNOSIS — D849 Immunodeficiency, unspecified: Secondary | ICD-10-CM | POA: Diagnosis not present

## 2023-09-21 DIAGNOSIS — D631 Anemia in chronic kidney disease: Secondary | ICD-10-CM | POA: Diagnosis not present

## 2023-09-21 DIAGNOSIS — Z8673 Personal history of transient ischemic attack (TIA), and cerebral infarction without residual deficits: Secondary | ICD-10-CM | POA: Diagnosis not present

## 2023-09-21 DIAGNOSIS — N201 Calculus of ureter: Secondary | ICD-10-CM | POA: Diagnosis not present

## 2023-09-21 DIAGNOSIS — I50810 Right heart failure, unspecified: Secondary | ICD-10-CM | POA: Diagnosis not present

## 2023-09-21 DIAGNOSIS — I429 Cardiomyopathy, unspecified: Secondary | ICD-10-CM | POA: Diagnosis not present

## 2023-09-21 DIAGNOSIS — I509 Heart failure, unspecified: Secondary | ICD-10-CM | POA: Diagnosis not present

## 2023-09-21 DIAGNOSIS — Z9884 Bariatric surgery status: Secondary | ICD-10-CM | POA: Diagnosis not present

## 2023-09-21 DIAGNOSIS — T8619 Other complication of kidney transplant: Secondary | ICD-10-CM | POA: Diagnosis not present

## 2023-09-21 DIAGNOSIS — R188 Other ascites: Secondary | ICD-10-CM | POA: Diagnosis not present

## 2023-09-21 DIAGNOSIS — N1330 Unspecified hydronephrosis: Secondary | ICD-10-CM | POA: Diagnosis not present

## 2023-09-21 DIAGNOSIS — K219 Gastro-esophageal reflux disease without esophagitis: Secondary | ICD-10-CM | POA: Diagnosis not present

## 2023-09-21 DIAGNOSIS — D649 Anemia, unspecified: Secondary | ICD-10-CM | POA: Diagnosis not present

## 2023-09-24 DIAGNOSIS — N202 Calculus of kidney with calculus of ureter: Secondary | ICD-10-CM | POA: Diagnosis not present

## 2023-09-24 DIAGNOSIS — Z7982 Long term (current) use of aspirin: Secondary | ICD-10-CM | POA: Diagnosis not present

## 2023-09-24 DIAGNOSIS — Z8673 Personal history of transient ischemic attack (TIA), and cerebral infarction without residual deficits: Secondary | ICD-10-CM | POA: Diagnosis not present

## 2023-09-24 DIAGNOSIS — J4489 Other specified chronic obstructive pulmonary disease: Secondary | ICD-10-CM | POA: Diagnosis not present

## 2023-09-24 DIAGNOSIS — I132 Hypertensive heart and chronic kidney disease with heart failure and with stage 5 chronic kidney disease, or end stage renal disease: Secondary | ICD-10-CM | POA: Diagnosis not present

## 2023-09-24 DIAGNOSIS — Z940 Kidney transplant status: Secondary | ICD-10-CM | POA: Diagnosis not present

## 2023-09-24 DIAGNOSIS — D631 Anemia in chronic kidney disease: Secondary | ICD-10-CM | POA: Diagnosis not present

## 2023-09-24 DIAGNOSIS — J449 Chronic obstructive pulmonary disease, unspecified: Secondary | ICD-10-CM | POA: Diagnosis not present

## 2023-09-24 DIAGNOSIS — E785 Hyperlipidemia, unspecified: Secondary | ICD-10-CM | POA: Diagnosis not present

## 2023-09-24 DIAGNOSIS — T83729A Exposure of other prosthetic materials into organ or tissue, initial encounter: Secondary | ICD-10-CM | POA: Diagnosis not present

## 2023-09-24 DIAGNOSIS — Z888 Allergy status to other drugs, medicaments and biological substances status: Secondary | ICD-10-CM | POA: Diagnosis not present

## 2023-09-24 DIAGNOSIS — I5022 Chronic systolic (congestive) heart failure: Secondary | ICD-10-CM | POA: Diagnosis not present

## 2023-09-24 DIAGNOSIS — N186 End stage renal disease: Secondary | ICD-10-CM | POA: Diagnosis not present

## 2023-09-24 DIAGNOSIS — K219 Gastro-esophageal reflux disease without esophagitis: Secondary | ICD-10-CM | POA: Diagnosis not present

## 2023-09-24 DIAGNOSIS — Z79899 Other long term (current) drug therapy: Secondary | ICD-10-CM | POA: Diagnosis not present

## 2023-09-28 DIAGNOSIS — N2581 Secondary hyperparathyroidism of renal origin: Secondary | ICD-10-CM | POA: Diagnosis not present

## 2023-09-28 DIAGNOSIS — B9689 Other specified bacterial agents as the cause of diseases classified elsewhere: Secondary | ICD-10-CM | POA: Diagnosis not present

## 2023-09-28 DIAGNOSIS — J208 Acute bronchitis due to other specified organisms: Secondary | ICD-10-CM | POA: Diagnosis not present

## 2023-09-28 DIAGNOSIS — M818 Other osteoporosis without current pathological fracture: Secondary | ICD-10-CM | POA: Diagnosis not present

## 2023-10-03 ENCOUNTER — Telehealth (HOSPITAL_COMMUNITY): Payer: Self-pay | Admitting: Cardiology

## 2023-10-03 NOTE — Telephone Encounter (Signed)
 Called to confirm/remind patient of their appointment [facility] on 10/03/23.   Appointment:   [x] Confirmed  [] Left mess   [] No answer/No voice mail  [] VM Full/unable to leave message  [] Phone not in service  Patient reminded to bring all medications and/or complete list.  Confirmed patient has transportation. Gave directions, instructed to utilize valet parking.

## 2023-10-04 ENCOUNTER — Ambulatory Visit (HOSPITAL_COMMUNITY)
Admission: RE | Admit: 2023-10-04 | Discharge: 2023-10-04 | Disposition: A | Discharge status: Home / Self Care | Source: Ambulatory Visit | Attending: Cardiology | Admitting: Cardiology

## 2023-10-04 ENCOUNTER — Ambulatory Visit (HOSPITAL_COMMUNITY): Payer: Self-pay | Admitting: Cardiology

## 2023-10-04 ENCOUNTER — Encounter (HOSPITAL_COMMUNITY): Payer: Self-pay | Admitting: Cardiology

## 2023-10-04 VITALS — BP 102/60 | HR 74 | Wt 170.8 lb

## 2023-10-04 DIAGNOSIS — I5021 Acute systolic (congestive) heart failure: Secondary | ICD-10-CM | POA: Insufficient documentation

## 2023-10-04 DIAGNOSIS — E785 Hyperlipidemia, unspecified: Secondary | ICD-10-CM | POA: Insufficient documentation

## 2023-10-04 DIAGNOSIS — N186 End stage renal disease: Secondary | ICD-10-CM | POA: Insufficient documentation

## 2023-10-04 DIAGNOSIS — Z952 Presence of prosthetic heart valve: Secondary | ICD-10-CM | POA: Insufficient documentation

## 2023-10-04 DIAGNOSIS — I132 Hypertensive heart and chronic kidney disease with heart failure and with stage 5 chronic kidney disease, or end stage renal disease: Secondary | ICD-10-CM | POA: Diagnosis not present

## 2023-10-04 DIAGNOSIS — Z7951 Long term (current) use of inhaled steroids: Secondary | ICD-10-CM | POA: Insufficient documentation

## 2023-10-04 DIAGNOSIS — Z8673 Personal history of transient ischemic attack (TIA), and cerebral infarction without residual deficits: Secondary | ICD-10-CM | POA: Insufficient documentation

## 2023-10-04 DIAGNOSIS — Z940 Kidney transplant status: Secondary | ICD-10-CM | POA: Diagnosis not present

## 2023-10-04 DIAGNOSIS — Z7952 Long term (current) use of systemic steroids: Secondary | ICD-10-CM | POA: Insufficient documentation

## 2023-10-04 DIAGNOSIS — N400 Enlarged prostate without lower urinary tract symptoms: Secondary | ICD-10-CM | POA: Diagnosis not present

## 2023-10-04 DIAGNOSIS — I5022 Chronic systolic (congestive) heart failure: Secondary | ICD-10-CM | POA: Insufficient documentation

## 2023-10-04 DIAGNOSIS — J449 Chronic obstructive pulmonary disease, unspecified: Secondary | ICD-10-CM | POA: Insufficient documentation

## 2023-10-04 DIAGNOSIS — I428 Other cardiomyopathies: Secondary | ICD-10-CM | POA: Diagnosis not present

## 2023-10-04 DIAGNOSIS — I052 Rheumatic mitral stenosis with insufficiency: Secondary | ICD-10-CM | POA: Insufficient documentation

## 2023-10-04 DIAGNOSIS — I493 Ventricular premature depolarization: Secondary | ICD-10-CM | POA: Insufficient documentation

## 2023-10-04 DIAGNOSIS — Z79899 Other long term (current) drug therapy: Secondary | ICD-10-CM | POA: Diagnosis not present

## 2023-10-04 DIAGNOSIS — Z79624 Long term (current) use of inhibitors of nucleotide synthesis: Secondary | ICD-10-CM | POA: Insufficient documentation

## 2023-10-04 DIAGNOSIS — R188 Other ascites: Secondary | ICD-10-CM | POA: Insufficient documentation

## 2023-10-04 DIAGNOSIS — Z7982 Long term (current) use of aspirin: Secondary | ICD-10-CM | POA: Insufficient documentation

## 2023-10-04 DIAGNOSIS — E8770 Fluid overload, unspecified: Secondary | ICD-10-CM | POA: Diagnosis not present

## 2023-10-04 LAB — BASIC METABOLIC PANEL WITH GFR
Anion gap: 8 (ref 5–15)
BUN: 43 mg/dL — ABNORMAL HIGH (ref 6–20)
CO2: 26 mmol/L (ref 22–32)
Calcium: 9.2 mg/dL (ref 8.9–10.3)
Chloride: 105 mmol/L (ref 98–111)
Creatinine, Ser: 2.31 mg/dL — ABNORMAL HIGH (ref 0.61–1.24)
GFR, Estimated: 32 mL/min — ABNORMAL LOW (ref >60)
Glucose, Bld: 110 mg/dL — ABNORMAL HIGH (ref 70–99)
Potassium: 4.6 mmol/L (ref 3.5–5.1)
Sodium: 139 mmol/L (ref 135–145)

## 2023-10-04 LAB — BRAIN NATRIURETIC PEPTIDE: B Natriuretic Peptide: 152 pg/mL — ABNORMAL HIGH (ref 0.0–100.0)

## 2023-10-04 MED ORDER — SPIRONOLACTONE 25 MG PO TABS: 12.5000 mg | ORAL_TABLET | Freq: Every day | ORAL | 3 refills | Status: AC

## 2023-10-04 NOTE — Patient Instructions (Signed)
 Medication Changes:  START SPIRONOLACTONE  12.5MG  ONCE DAILY   Lab Work:  Labs done today, your results will be available in MyChart, we will contact you for abnormal readings.  THEN PLEASE RETURN FOR LABS AS SCHEDULED IN 10 DAYS   Follow-Up in: 3 MONTHS AS SCHEDULED WITH APP CLINIC   At the [facility], you and your health needs are our priority. We have a designated team specialized in the treatment of Heart Failure. This Care Team includes your primary Heart Failure Specialized Cardiologist (physician), Advanced Practice Providers (APPs- Physician Assistants and Nurse Practitioners), and Pharmacist who all work together to provide you with the care you need, when you need it.   You may see any of the following providers on your designated Care Team at your next follow up:  Dr. Toribio Fuel Dr. Ezra Shuck Dr. Ria Commander Dr. Odis Brownie Greig Mosses, NP Caffie Shed, [place] [facility] [place], [place] Beckey Coe, NP [place] Lee, NP Tinnie Redman, PharmD   Please be sure to bring in all your medications bottles to every appointment.   Need to Contact Us :  If you have any questions or concerns before your next appointment please send us  a message through [facility] or call our office at [phone].    TO LEAVE A MESSAGE FOR THE NURSE SELECT OPTION 2, PLEASE LEAVE A MESSAGE INCLUDING: YOUR NAME DATE OF BIRTH CALL BACK NUMBER REASON FOR CALL**this is important as we prioritize the call backs  YOU WILL RECEIVE A CALL BACK THE SAME DAY AS LONG AS YOU CALL BEFORE 4:00 PM

## 2023-10-05 ENCOUNTER — Telehealth (HOSPITAL_COMMUNITY): Payer: Self-pay | Admitting: *Deleted

## 2023-10-05 NOTE — Progress Notes (Signed)
 Heart Failure Clinic Note  PCP:  Keren Vicenta BRAVO, MD  Cardiologist:  Dr. Rolan  Chief complaint: CHF   History of Present Illness: Vernon Blackburn is a 58 y.o. male who has a history of long-standing, poorly-controlled HTN, CKD stage 3-4, chronic systolic CHF and severe mitral regurgitation.  He has had hypertension for years.  He has had known CKD, followed by [facility].  He developed severe dyspnea in 12/17 and was admitted with acute systolic CHF.  Echo showed EF 20-25% with severe MR.  He was diuresed and discharged.   TEE was done in 5/18 to assess mitral valve.  There was severe MR.  The MV was thickened and did not coapt well.  Suspect there was a component of secondary MR with moderate LV dilation, however the thickened leaflets suggested a component of primary MR as well.   He had right and left heart cath in 6/18.  No significant CAD, elevated filling pressures.    In 6/18, he had mitral valve repair.  Post-op echo in 7/18 showed EF 25-30% with stable MV repair (mild MR, mean gradient 4 mmHg).  Echo 12/18 with EF 30-35%, moderate LV dilation, s/p MVR repair with trivial MR.  Echo in 4/19 showed EF up to 40-45% with stable repaired mitral valve.   He had an echo in 8/20 with EF 35%, mild LV dilation, s/p MV repair with mean gradient 4 mmHg and trivial MR, mildly decreased RV systolic function.   He was admitted in 2/21 to [facility] with worsening renal function and HD was started.  Echo at [facility] in 2/21 showed EF 40-45%, mild LV dilation, moderate LVH, severe mitral stenosis with mean gradient 7 mmHg and eccentric mild MR.  Head imagining showed an old CVA.   TEE was done to followup on ?severe mitral stenosis in 3/21.  This showed EF 45%, moderate LVH, global mild hypokinesis, mildly decreased RV systolic function; s/p MV repair, mild MR with mean gradient 4 mmHg, no significant mitral stenosis.  CPX in 11/21 showed peak VO2 17.7 (51% predicted), RER 1.12, VE/VCO2 slope 29.  Moderate  functional limitation due to mixed restrictive/obstructive lung disease.  Echo in 7/22 showed EF 50-55%, s/p MV repair with trivial MR and mean gradient 5 mmHg.   Patient was admitted in 7/22 with abdominal distention and found to have ascites, cause still uncertain (unless simply due to inadequate dialysis prescription).  No evidence for cirrhosis by CT. He had paracentesis. He additionally had TURP for enlarged prostate and has ended up with a suprapubic catheter.    RHC in 10/22 to assess RV function in the setting of ascites of uncertain etiology showed normal right and left heart filling pressures, normal PA pressure, and normal cardiac output.  He has been seen by GI and continues to require paracenteses.  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   Echo in 1/24 showed EF 50%, mild LVH, mild global hypokinesis, mildly decreased RV systolic function with mild RV enlargement, s/p mitral valve repair with mild MR.  Cardiolite in 1/24 showed no ischemia/infarction.   He underwent living donor kidney transplant in 11/24. He was admitted to [facility] in 04/25 with neutropenic fever and septic shock 2/2 Klebsiella urosepsis. Had nephrostomy tub removed April 9th. Had slow passage of dye on antegrade nephrostogram through a stenotic kinked ureter.  He was seen in the HF clinic 05/07/23. Volume overloaded. ReDS 42%. Lasix  was increased  lasix  40 mg twice a day. Lasix  subsequently increased to 80 mg bid by [facility] renal transplant team.   Echo in 6/25 showed EF 50-55%, mild LVH, s/p MV repair with mean gradient 6 mmHg and trivial MR.   Today he returns for HF follow up.  He still gets periodic paracenteses, the last was in early 9/25.  He feels like the ascites fluid is taking longer to build up.  Weight stable. No dyspnea with usual activities.  Dyspnea with stairs, hills.  No chest pain.  No orthopnea/PND.  No lightheadedness.  Working part time as a Clinical research associate.    ECG (personally reviewed): NSR, normal  Labs (9/25): K 3.1, creatinine 2.3  PMH: 1. Gout 2. HTN: Long-standing, poor control. 3. ESRD.  Likely hypertensive nephropathy.  - Sp living donor kidney transplant at [facility] 11/24 4. Dilated cardiomyopathy: May be due to long-standing HTN.  - Echo (1/18): EF 20-25%, severe MR - Echo (2/18): EF 25%, severe MR.  - TEE (5/18): Moderate LV dilation with severe global hypokinesis, EF 25-30%, normal RV size with mildly decreased systolic function, RV-RA gradient 55 mmHg, severe MR with mildly thickened leaflets with inadequate coaptation and ERO 0.52 cm^2 by PISA and systolic flow reversal in the pulmonary vein doppler pattern => secondary MR from dilated LV but thickened leaflets lead to concern for component of primary MR as well.  - RHC/LHC (6/18): No significant CAD.  Mean RA 15, PA 76/31 mean 48, mean PCWP 45, CI 3.02 Fick, 2.64 thermo.  - Echo (7/18): EF 25-30%, mild dilation, diffuse hypokinesis, s/p MV repair with mean gradient 4 mmHg and mild MR, PASP 37 mmHg, normal RV size and systolic function.  - Echo (12/18): EF 30-35%, moderate LV dilation with diffuse hypokinesis, stable MV repair.  - Echo (4/19): EF 40-45%, severe LV dilation, s/p mitral valve repair with mean gradient 4 mmHg, no mitral regurgitation, severe RV dilation with normal systolic function.  - Echo (8/20): EF 35%, mild LV dilation, s/p MV repair with mean gradient 4 mmHg and trivial MR, mildly decreased RV systolic function.  - Echo (2/21, [facility]):  EF 40-45%, mild LV dilation, moderate LVH, severe mitral stenosis with mean gradient 7 mmHg and eccentric mild MR. - TEE (3/21): EF 45%, moderate LVH, global mild hypokinesis, mildly decreased RV systolic function; s/p MV repair, mild MR with mean gradient 4 mmHg, no significant mitral stenosis.  - CPX (11/21): peak VO2 17.7 (51% predicted), RER 1.12, VE/VCO2 slope 29.  Moderate functional limitation due to mixed restrictive/obstructive  lung disease.  - Echo (7/22): EF 50-55%, s/p MV repair with trivial MR and mean gradient 5 mmHg.  - RHC (10/22): mean RA 1, PA 17/2, mean PCWP 2, CI 3.3 - Echo (1/24): EF 50%, mild LVH, mild global hypokinesis, mildly decreased RV systolic function with mild RV enlargement, s/p mitral valve repair with mild MR.  - Cardiolite (1/24): EF 45%, no ischemia/infarction.  - Echo at [facility] 11/24: EF 55%, RV okay, trivial MR - Echo (6/25): EF 50-55%, mild LVH, s/p MV repair with mean gradient 6 mmHg and trivial MR.  5. Severe MR: S/p MV repair in 6/18. 6. H/o CVA 7. BPH: S/p TURP, now with suprapubic catheter.  8. H/o ascites with paracenteses.  9. PVCs: Zio monitor in 6/25 showed 5.7% PVCs.   SH: Married, lives in [place], nonsmoker, no drugs, occasional ETOH.  Lawyer.   Family History  Problem Relation Age of Onset   Diabetes Mother    Hypertension Mother  Heart failure Mother    Colon cancer Neg Hx    Esophageal cancer Neg Hx    Rectal cancer Neg Hx    Stomach cancer Neg Hx    ROS: All systems reviewed and negative except as per HPI.   Current Outpatient Medications  Medication Sig Dispense Refill   acetaminophen  (TYLENOL ) 500 MG tablet Take 500 mg by mouth as needed.     Albuterol  Sulfate (PROAIR  RESPICLICK) 108 (90 Base) MCG/ACT AEPB Inhale 2 puffs into the lungs every 6 (six) hours as needed (wheezing/shortness of breath).     aspirin  EC 81 MG tablet Take 81 mg by mouth daily. Swallow whole.     atorvastatin  (LIPITOR) 20 MG tablet Take 20 mg by mouth.     BREO ELLIPTA  200-25 MCG/ACT AEPB Inhale 1 puff into the lungs.     calcium  acetate (PHOSLO ) 667 MG capsule Take 2,668 mg by mouth 3 (three) times daily with meals.     carvedilol  (COREG ) 6.25 MG tablet Take 1 tablet (6.25 mg total) by mouth 2 (two) times daily. 180 tablet 3   docusate sodium  (COLACE) 50 MG capsule Take 50 mg by mouth.     famotidine  (PEPCID ) 20 MG tablet Take 20 mg by mouth 2 (two) times daily.     fexofenadine  (ALLEGRA) 180 MG tablet Take 180 mg by mouth.     furosemide  (LASIX ) 80 MG tablet Take 80 mg by mouth 2 (two) times daily.     lisdexamfetamine (VYVANSE ) 30 MG capsule Take 30 mg by mouth as needed.     lisdexamfetamine (VYVANSE ) 30 MG capsule Take 30 mg by mouth daily.     Melatonin 10 MG TABS Take 10 mg by mouth at bedtime.     midodrine  (PROAMATINE ) 5 MG tablet Take 5 mg by mouth as needed. For blood pressure less than 120 systolic     mycophenolate  (CELLCEPT) 250 MG capsule Take 750 mg by mouth 2 (two) times daily.     pantoprazole  (PROTONIX ) 20 MG tablet Take 20 mg by mouth daily.     polyethylene glycol (MIRALAX  / GLYCOLAX ) 17 g packet Take 17 g by mouth as needed.     potassium chloride  SA (KLOR-CON  M) 20 MEQ tablet Take 2 tablets (40 mEq total) by mouth daily.     predniSONE  (DELTASONE ) 5 MG tablet Take 5 mg by mouth daily.     spironolactone  (ALDACTONE ) 25 MG tablet Take 0.5 tablets (12.5 mg total) by mouth daily. 45 tablet 3   sulfamethoxazole-trimethoprim (BACTRIM DS) 800-160 MG tablet Take 1 tablet by mouth 3 (three) times a week.     No current facility-administered medications for this encounter.   Exam:   BP 102/60   Pulse 74   Wt 77.5 kg (170 lb 12.8 oz)   SpO2 100%   BMI 25.22 kg/m  Wt Readings from Last 3 Encounters:  10/04/23 77.5 kg (170 lb 12.8 oz)  06/21/23 77.7 kg (171 lb 3.2 oz)  06/08/23 83.3 kg (183 lb 9.6 oz)  General: NAD Neck: No JVD, no thyromegaly or thyroid  nodule.  Lungs: Crackles at bases.  CV: Nondisplaced PMI.  Heart regular S1/S2, no S3/S4, no murmur.  1+ ankle edema.  No carotid bruit.  Normal pedal pulses.  Abdomen: Soft, nontender, no hepatosplenomegaly, mild distention.  Skin: Intact without lesions or rashes.  Neurologic: Alert and oriented x 3.  Psych: Normal affect. Extremities: No clubbing or cyanosis.  HEENT: Normal.   Assessment/Plan: 1. Chronic systolic CHF now  with recovered EF: Echo in 7/18 with EF 25-30%. Nonischemic  cardiomyopathy based on cath in 6/18.  Cause of cardiomyopathy may be long-standing, poorly controlled HTN.  NYHA class II symptoms.  He improved symptomatically s/p MV repair.  Echo in 4/19 with EF 40-45%. Echo at [facility] in 2/21 showed EF 40-45%.   TEE in 3/21 with EF 45%.  Echo in 5/22 with EF up to 50-55%.  Echo in 1/24 showed EF 50%, mild LVH, mild global hypokinesis, mildly decreased RV systolic function with mild RV enlargement, s/p mitral valve repair with mild MR. RHC in 10/22 was normal, hard to explain his ascites by RV failure.  Echo in 6/25 showed  EF 50-55%, mild LVH, s/p MV repair with mean gradient 6 mmHg and trivial MR.   Mild volume overload, suspect mainly due to ascites with mildly distended abdomen. NYHA class II.  - I will keep Lasix  at 80 mg bid but with tendency to develop ascites will add spironolactone  12.5 mg daily. Will need to watch creatinine closely with this, check BMET/BNP today and BMET in 10 days.  - Continue coreg  6.25 mg BID 2. HTN: Possible hypertensive cardiomyopathy and hypertensive nephropathy.  Stable.  - Continue coreg   3. ESRD: S/p living donor kidney transplant at [facility] in 11/24. - On Cellcept 750 mg BID and prednisone  5 mg daily (Bactrim and valcyte for prophylaxis) 4. Mitral regurgitation: s/p MV repair for severe MR.  Echo in 6/25 showed stable repaired mitral valve with no more than mild stenosis and trivial regurgitation.  - Will need abx with dental work given prosthetic material.  5. CVA: Prior CVA by head imaging.   6. Hyperlipidemia: On 20 mg atorvastatin  daily 7. Ascites: No cirrhosis on liver imaging and though prior RHC did not show significant RV dysfunction, CHF is probably the main etiology.  He has required therapeutic paracenteses periodically (last in early 9/25).  - Carefully add spironolactone  as above.  8. PVCs: Zio monitor in 6/25 with 5.7% PVCs.  - Continue Coreg , no BP room to increase.   Follow up 3 months with APP.   I spent 31  minutes reviewing records, interviewing/examining patient, and managing orders.   Signed, Ezra Shuck, MD  10/05/2023  [facility]  [address] [facility] [place] [address] [phone] (office) [phone] (fax)

## 2023-10-05 NOTE — Telephone Encounter (Signed)
 Called patient per Dr. Rolan with following lab results and instructions:  Creatinine 2.3 (same as at nephrology appt). Would make sure he gets BMET in 1 week with addition of spironolactone .  Pt verbalized understanding of same. Confirmed lab scheduled lab appointment 10/15/23 at 9:15.

## 2023-10-12 DIAGNOSIS — D849 Immunodeficiency, unspecified: Secondary | ICD-10-CM | POA: Diagnosis not present

## 2023-10-12 DIAGNOSIS — Z940 Kidney transplant status: Secondary | ICD-10-CM | POA: Diagnosis not present

## 2023-10-15 ENCOUNTER — Ambulatory Visit (HOSPITAL_COMMUNITY)
Admission: RE | Admit: 2023-10-15 | Discharge: 2023-10-15 | Disposition: A | Discharge status: Home / Self Care | Source: Ambulatory Visit | Attending: Internal Medicine | Admitting: Internal Medicine

## 2023-10-15 DIAGNOSIS — I5022 Chronic systolic (congestive) heart failure: Secondary | ICD-10-CM | POA: Insufficient documentation

## 2023-10-15 LAB — BASIC METABOLIC PANEL WITH GFR
Anion gap: 11 (ref 5–15)
BUN: 36 mg/dL — ABNORMAL HIGH (ref 6–20)
CO2: 21 mmol/L — ABNORMAL LOW (ref 22–32)
Calcium: 8.6 mg/dL — ABNORMAL LOW (ref 8.9–10.3)
Chloride: 108 mmol/L (ref 98–111)
Creatinine, Ser: 1.9 mg/dL — ABNORMAL HIGH (ref 0.61–1.24)
GFR, Estimated: 41 mL/min — ABNORMAL LOW (ref >60)
Glucose, Bld: 82 mg/dL (ref 70–99)
Potassium: 4.1 mmol/L (ref 3.5–5.1)
Sodium: 140 mmol/L (ref 135–145)

## 2023-10-19 DIAGNOSIS — Z48298 Encounter for aftercare following other organ transplant: Secondary | ICD-10-CM | POA: Diagnosis not present

## 2023-10-19 DIAGNOSIS — Z940 Kidney transplant status: Secondary | ICD-10-CM | POA: Diagnosis not present

## 2023-10-19 DIAGNOSIS — T8619 Other complication of kidney transplant: Secondary | ICD-10-CM | POA: Diagnosis not present

## 2023-10-19 DIAGNOSIS — Z7952 Long term (current) use of systemic steroids: Secondary | ICD-10-CM | POA: Diagnosis not present

## 2023-10-19 DIAGNOSIS — I151 Hypertension secondary to other renal disorders: Secondary | ICD-10-CM | POA: Diagnosis not present

## 2023-10-19 DIAGNOSIS — Z79899 Other long term (current) drug therapy: Secondary | ICD-10-CM | POA: Diagnosis not present

## 2023-10-19 DIAGNOSIS — D84821 Immunodeficiency due to drugs: Secondary | ICD-10-CM | POA: Diagnosis not present

## 2023-10-19 DIAGNOSIS — E785 Hyperlipidemia, unspecified: Secondary | ICD-10-CM | POA: Diagnosis not present

## 2023-10-19 DIAGNOSIS — D529 Folate deficiency anemia, unspecified: Secondary | ICD-10-CM | POA: Diagnosis not present

## 2023-10-19 DIAGNOSIS — R310 Gross hematuria: Secondary | ICD-10-CM | POA: Diagnosis not present

## 2023-10-19 DIAGNOSIS — N189 Chronic kidney disease, unspecified: Secondary | ICD-10-CM | POA: Diagnosis not present

## 2023-10-19 DIAGNOSIS — Z79624 Long term (current) use of inhibitors of nucleotide synthesis: Secondary | ICD-10-CM | POA: Diagnosis not present

## 2023-10-19 DIAGNOSIS — R188 Other ascites: Secondary | ICD-10-CM | POA: Diagnosis not present

## 2023-10-19 DIAGNOSIS — I132 Hypertensive heart and chronic kidney disease with heart failure and with stage 5 chronic kidney disease, or end stage renal disease: Secondary | ICD-10-CM | POA: Diagnosis not present

## 2023-10-19 DIAGNOSIS — Z4822 Encounter for aftercare following kidney transplant: Secondary | ICD-10-CM | POA: Diagnosis not present

## 2023-10-19 DIAGNOSIS — D520 Dietary folate deficiency anemia: Secondary | ICD-10-CM | POA: Diagnosis not present

## 2023-10-19 DIAGNOSIS — N135 Crossing vessel and stricture of ureter without hydronephrosis: Secondary | ICD-10-CM | POA: Diagnosis not present

## 2023-10-19 DIAGNOSIS — I509 Heart failure, unspecified: Secondary | ICD-10-CM | POA: Diagnosis not present

## 2023-10-19 DIAGNOSIS — D849 Immunodeficiency, unspecified: Secondary | ICD-10-CM | POA: Diagnosis not present

## 2023-10-19 DIAGNOSIS — I50810 Right heart failure, unspecified: Secondary | ICD-10-CM | POA: Diagnosis not present

## 2023-10-19 DIAGNOSIS — D631 Anemia in chronic kidney disease: Secondary | ICD-10-CM | POA: Diagnosis not present

## 2023-10-19 DIAGNOSIS — Z7982 Long term (current) use of aspirin: Secondary | ICD-10-CM | POA: Diagnosis not present

## 2023-10-19 DIAGNOSIS — N132 Hydronephrosis with renal and ureteral calculous obstruction: Secondary | ICD-10-CM | POA: Diagnosis not present

## 2023-10-19 DIAGNOSIS — R809 Proteinuria, unspecified: Secondary | ICD-10-CM | POA: Diagnosis not present

## 2023-11-05 ENCOUNTER — Encounter: Payer: Self-pay | Admitting: Radiology

## 2023-11-15 NOTE — Progress Notes (Signed)
 [facility]  Post-Transplant Follow-Up   NON KIDNEY TRANSPLANT PROVIDERS Vernon Vicenta BRAVO, MD SITE OF VISIT [facility]   CHIEF COMPLAINT Kidney Transplant Follow-up   HISTORY OF PRESENT ILLNESS [name] is a 58 y.o. man with a history of ESRD secondary to unverified etiology, presumed hypertension, historical dialysis dependence since 02/2019, status post TURP on 05/25/2020, mitral valve repair in 2018, gout, heart failure with a reduced ejection fraction, asthma, gastric bypass, prior stroke, nephrolithiasis, calcium  oxalate stones, prior history of ascites requiring paracentesis, status post urethroplasty in 05/2021 and hydrocele repair in 06/2021. Now s/p LDKT 11/08/2022 (left kidney, standard anatomy, CIT of 11:18, WIT of 0:24, Campath induction, PRA of 11/0, CMV recipient+, EBV recipient+). The intraoperative course was notable for hypotension requiring dopamine  and bladder spasms requiring oxybutynin .  Posttransplant course notable for JP drain replacement 11/2022 and ongoing perinephric fluid collections.  Readmission 11/2018 for for diarrhea and urinary retention status post PCN and US  and Foley placement and urine leak.  PCN subsequently removed 04/2023 ureteral stricture with balloon dilation.  Klebsiella UTI 04/2023.  Ongoing ascites evaluated by cardiology and hepatology thought to be due to constrictive cardiac physiology    History of Present Illness Vernon Blackburn is a 58 year old male with kidney stones and fluid retention who presents for follow-up on his kidney function and management of constipation.  He feels great overall and notes that his fluid retention has been well-managed with Lasix  and spironolactone . He mentions that the Lasix  is rough but he has adjusted to it. He is currently taking a half tablet of spironolactone  daily, which was prescribed by his cardiologist about a month ago. He monitors his blood pressure, blood sugar, and weight daily.  He  is experiencing constipation despite not currently taking iron  pills, which he associates with discomfort and reluctance to eat. He has been using Marilax and Senokot, but finds them insufficient. He wants to resume lactulose, which he previously used during dialysis, to manage his constipation. He has been eating high-fiber foods, but they have not been effective recently.  He inquires about his creatinine levels, noting that recent blood work showed a creatinine level of 2.2, which is consistent with his typical range. He reports passing stone fragments during urination and experiencing a burning sensation, which he associates with stone movement. He has a history of calcium  oxalate stones and has been advised to limit oxalate intake in his diet.  He had cystoscopy with urology in September, they found 1.  Two ureteral stones in left native ureter distal to the anastomosis to the transplant ureter. Stones adherent to exposed suture material in the ureter 2. Anastomosis of transplant ureter to native left distal ureter narrow and tortuous. Not passable with a ureteroscope but a stent was able to traverse it with proximal coil in the transplant lower pole and distal coil in the bladder  He has a new stent in place in his ureter. He has experienced burning and spasms during urination, particularly in the evenings, and has previously taken oxybutynin  for similar symptoms.  He has a history of erectile dysfunction, which he attributes to his medical conditions and treatments. He has been married for six years and is interested in addressing this issue as his overall health improves.  He is currently off iron  pills due to constipation concerns and relies on dietary sources for iron  intake. He takes potassium supplements and has received a flu shot and pneumonia vaccine but has not received a COVID-19 vaccine.  Vitals:   11/15/23 1112  BP: 122/82  Pulse: 73  Resp: 18  Temp: 36.8 C (98.2 F)   TempSrc: Oral  SpO2: 99%  Weight: 76 kg (167 lb 8.8 oz)  PainSc: 0-No pain   Physical Exam GENERAL: Alert, cooperative, well developed, no acute distress, normal appearance HEENT: Normocephalic, normal oropharynx, moist mucous membranes, no oral sores CHEST: Clear to auscultation bilaterally, no wheezes, rhonchi, or crackles CARDIOVASCULAR: Normal heart rate and rhythm, S1 and S2 normal without murmurs ABDOMEN: Soft, non-tender, non-distended, without organomegaly, normal bowel sounds EXTREMITIES: No cyanosis or edema, extremities warm and well perfused NEUROLOGICAL: Cranial nerves grossly intact, moves all extremities without gross motor or sensory deficit    Assessment & Plan Status post living donor kidney transplant 11/08/2022 Posttransplant course has been complicated by ureteral stricture with PCN and US  status post removal, cardiac ascites and perinephric fluid collections Clearance: Baseline creatinine appears to be in the 1.7-2.1 mg/dL, CKD IIIb Urine: Stable proteinuria between [id number] mg/g on spot checks with ongoing hematuria SCr 2.1 with stable renal function (10/12/23) spot urine PCR 800 (09/21/23) -> 1600 (10/12/23) and may be falsely elevated due to recent Urologic intervention, monitor HLA: DSA neg 07/20/23, DSA neg 10/12/23 Molecular: Cell free DNA: AlloSure checked 05/2023 0.49%, Allosure 0.40% (09/14/23)   Nephrolithiasis, Hydronephrosis, left renal allograft Complicated urologic course including ureteral stricture requiring PCN and NUS.  He also has a history of nephrolithiasis, TURP and a urethroplasty and hydrocele repair of his native ureter in 2023.   Complication of kidney transplant with ureteral stricture, stent in place, and calcium  oxalate nephrolithiasis Ureteral stricture with stent in place. Calcium  oxalate nephrolithiasis with recent burning sensation during urination, likely due to stone movement. Previous stones were calcium  oxalate. Stent is temporary  and will be removed in an office procedure. Urine sample collected for analysis to rule out infection. - Continue stent in place until removal by urologist. - Encouraged high water intake (60-80 ounces daily) to prevent stone formation, coupled with low sodium diet - if he truly needs potassium supplement, we can convert that to K-citrate instead of KCl, that would help  - could consider changing furosemide  to thiazide but I do remember he is prone to fluid overload and finally very stable and euvolemic.  - Advised taking Tums with meals to bind oxalates and prevent stone formation. - Provided low oxalate diet handout. - Will discuss stent removal timing with urologist.  Drug-induced constipation Constipation likely due to iron  supplementation and other medications. Currently using Marilax and Senokot with insufficient relief. Lactulose previously effective during dialysis. - Prescribed lactulose 15 mL every other day as needed. - Encouraged high fiber diet. - Advised to monitor bowel movements and adjust lactulose use accordingly.  Immunosuppression Belatacept  q 28 days CellCept 750 mg every 12 hours Prednisone  5 mg daily   Prophylaxis PJP: Bactrim DS MWF for 1 year, can stop now Aspirin  81 mg daily Famotidine  20 mg twice daily CMV: Completed routine prophylaxis, continue screening through 1 year   Ascites Evaluated by both [facility] and [facility].  Hepatology was not concerned for a liver cause of his ascites and it is thought that he has a constrictive physiology with a leaky peritoneum where he preferentially pulls ascites rather than lower extremity edema.   Following up with Cardiology locally Seems to be better   HTN/Volume Ascites better controlled with addition of spironolactone  to diuretic regimen Prescribed Coreg  3.125 mg BID - only takes if SBP >120 and is not taking  so will STOP Cont Furosemide  80 mg BID Spironolactone  12.5 mg daily  Anemia History of both  iron  deficiency and folate deficiency Completed Iron  therapy Continue folate supplement  Health Maintenance  Remain uptodate with age appropriate vaccines, avoid live vaccines Up-to-date on routine posttransplant cancer screening Dyslipidemia: continue aspirin  and statin      Lab Results  Component Value Date   CHOLTOTAL 105 09/14/2023   LDLCALC 47 09/14/2023   HDL 44 09/14/2023   TRIG 70 09/14/2023   BMM:        Lab Results  Component Value Date   PTH 307 (H) 09/14/2023   CALCIUM  9.3 11/09/2023   PHOS 3.2 08/23/2023           Lab Results  Component Value Date   VITD 28 (L) 09/14/2023   Immunizations:       Immunization History  Administered Date(s) Administered  . HEP B (>=17YR) VACCINE (HEPLISAV-B) 09/09/2019, 10/27/2019  . HEP B (>=39YR) VACCINE (ENGERIX-B/RECOMBIVAX) 03/19/2019, 04/23/2019, 11/09/2022, 11/16/2022, 11/28/2022  . Influenza IIV4, IM pres-free 09/29/2020  . Influenza, H1N1-09, unspecified 09/18/2022  . Influenza, IM unspecified 01/03/2016, 10/03/2019, 02/06/2020  . Influenza, recombinant (Egg-Free) IM PF (18 Yr+) (Flublok Quad) 10/10/2021  . PNEUMOCOCCAL (PCV13) (BIRTH-26YR) VACCINE (PREVNAR 13) 03/03/2019  . PNEUMOCOCCAL (PPSV23)(>=78YRS -OR- >=2 YRS WITH RISK) VACCINE (PNEUMOVAX 23 ) 01/03/2016, 02/06/2020, 08/22/2021, 04/15/2022  . Pneumococcal, unspecified 02/06/2020  . RZV(>=78YR -OR-19+YRS IF  IMMCOMP) VACCINE [facility]) 02/28/2022, 05/18/2022      PAST MEDICAL HISTORY Past Medical History:  Diagnosis Date  . Acute renal failure superimposed on chronic kidney disease () 08/25/2019  . Anemia in chronic kidney disease 02/11/2019  . Asymmetric SNHL (sensorineural hearing loss) 12/04/2017  . Cardiomyopathy (CMS/HHS-HCC) 08/25/2019  . CHF (congestive heart failure) (CMS/HHS-HCC) 06/06/2016  . COPD (chronic obstructive pulmonary disease) (CMS/HHS-HCC)   . ESRD on dialysis (CMS/HHS-HCC) 04/22/2019  . Gout 04/28/2011  . H/O gastric bypass  07/22/2013  . Heart disease Unknown  . Hepatic steatosis 08/29/2019  . History of CVA (cerebrovascular accident) without residual deficits 09/23/2023  . Hyperkalemia 08/29/2019  . Hypertension 04/28/2011  . Iron  deficiency anemia, unspecified 02/11/2019  . Kidney disease   . Nephrolithiasis     CURRENT MEDICATIONS Outpatient Encounter Medications as of 11/15/2023  Medication Sig Dispense Refill  . albuterol  MDI, PROVENTIL , VENTOLIN , PROAIR , HFA (VENTOLIN  HFA) 90 mcg/actuation inhaler Inhale 2 inhalations into the lungs every 4 (four) hours as needed for Wheezing or Shortness of Breath 18 g 3  . aspirin  81 MG EC tablet Take 1 tablet (81 mg total) by mouth once daily 120 tablet 0  . atorvastatin  (LIPITOR) 20 MG tablet Take 1 tablet (20 mg total) by mouth at bedtime 90 tablet 3  . blood glucose diagnostic (ACCU-CHEK GUIDE TEST STRIPS) test strip Use 1 each (1 strip total) once daily as instructed. (Patient taking differently: 1 strip 2 (two) times daily) 50 each 2  . famotidine  (PEPCID ) 20 MG tablet Take 1 tablet (20 mg total) by mouth 2 (two) times daily 60 tablet 6  . fexofenadine (ALLEGRA) 180 MG tablet Take 180 mg by mouth once daily    . fluticasone  furoate-vilanteroL (BREO ELLIPTA ) 200-25 mcg/dose DsDv Inhale 1 Puff into the lungs once daily 60 each 1  . folic acid  (FOLVITE ) 1 MG tablet Take 1 tablet (1 mg total) by mouth once daily 30 tablet 2  . FUROsemide  (LASIX ) 80 MG tablet Take 1 tablet (80 mg total) by mouth once daily 30 tablet 11  . lancets (  ACCU-CHEK SOFTCLIX LANCETS) Use 1 each once daily Use as instructed. (Patient taking differently: Use 1 each 2 (two) times daily Use as instructed.) 100 each 2  . lisdexamfetamine (VYVANSE ) 30 MG capsule Take 2 capsules (60 mg total) by mouth every morning for 30 days (Patient taking differently: Take 30 mg by mouth once daily as needed) 60 capsule 0  . multivitamin tablet Take 1 tablet by mouth once daily    . mycophenolate  (CELLCEPT) 250 mg  capsule Take 3 capsules (750 mg total) by mouth every 12 (twelve) hours 180 capsule 11  . pantoprazole  (PROTONIX ) 20 MG DR tablet Take 1 tablet (20 mg total) by mouth once daily 30 tablet 11  . polyethylene glycol (MIRALAX ) powder Take 17 g by mouth once daily as needed for Constipation Mix in 4-8ounces of fluid prior to taking.    . potassium chloride  (KLOR-CON  M20) 20 MEQ ER tablet Take 1 tablet (20 mEq total) by mouth once daily 90 tablet 0  . predniSONE  (DELTASONE ) 5 MG tablet Take 1 tablet (5 mg total) by mouth once daily 30 tablet 11  . sennosides-docusate (SENOKOT-S) 8.6-50 mg tablet Take 2 tablets by mouth 2 (two) times daily 120 tablet 1  . sodium chloride  0.9% SolP 100 mL with belatacept  250 mg SolR 475 mg IVPB Inject 475 mg into the vein every 28 (twenty-eight) days  Refrigerate. Protect from light. Administer via 1.2 micron filter extension set.    . spironolactone  (ALDACTONE ) 25 MG tablet Take 12.5 mg by mouth once daily    . sulfamethoxazole-trimethoprim (BACTRIM DS) 800-160 mg tablet Take 1 tablet (160 mg of trimethoprim total) by mouth every Monday, Wednesday, and Friday 13 tablet 11  . ferrous sulfate  324 mg (65 mg iron ) EC tablet Take 1 tablet (324 mg total) by mouth daily with breakfast (Patient not taking: Reported on 11/15/2023) 90 tablet 3   No facility-administered encounter medications on file as of 11/15/2023.    ALLERGIES Allergies  Allergen Reactions  . Isosorbide  Other (See Comments)    Hypotension     RESULTS As outlined above.   CONSULTATION AND COLLABORATION I have trended the patient's SCr, hg, WBC, anti-rejection levels (if available), lipid panel, iPTH, 25 vitamin D , urinalysis, spot protein to creatinine, CMV & BK screens.  I have reviewed the last two clinic notes. I have reviewed he immunization history, BMI, smoking history, & age appropriate cancer screening. These results have been edited in his/her electronic record.    FUTURE APPOINTMENTS   Future Appointments     Date/Time Provider Department Center Visit Type   11/15/2023 11:40 AM (Arrive by 11:10 AM) Henry Norleen Eck, MD [facility] TRANSPLANT FOLLOW-UP   12/03/2023 4:45 PM (Arrive by 4:30 PM) Gary Ros, MD [facility] VIDEO VISIT RETURN   12/05/2023 3:40 PM (Arrive by 3:25 PM) Lucille Monita Ee, MD [id number] VIDEO VISIT RETURN   12/07/2023 9:30 AM (Arrive by 9:15 AM) CLINIC 2A TREATMENT SCHEDULE [facility]        Attestation Statement:   I personally performed the service. (TP)  Norleen Eck Henry, MD

## 2023-11-25 ENCOUNTER — Encounter (HOSPITAL_BASED_OUTPATIENT_CLINIC_OR_DEPARTMENT_OTHER): Payer: Self-pay | Admitting: Emergency Medicine

## 2023-11-25 ENCOUNTER — Emergency Department (HOSPITAL_BASED_OUTPATIENT_CLINIC_OR_DEPARTMENT_OTHER)
Admission: EM | Admit: 2023-11-25 | Discharge: 2023-11-25 | Disposition: A | Discharge status: Home / Self Care | Attending: Emergency Medicine | Admitting: Emergency Medicine

## 2023-11-25 ENCOUNTER — Emergency Department (HOSPITAL_BASED_OUTPATIENT_CLINIC_OR_DEPARTMENT_OTHER)

## 2023-11-25 ENCOUNTER — Other Ambulatory Visit: Payer: Self-pay

## 2023-11-25 DIAGNOSIS — N390 Urinary tract infection, site not specified: Secondary | ICD-10-CM | POA: Insufficient documentation

## 2023-11-25 DIAGNOSIS — Z940 Kidney transplant status: Secondary | ICD-10-CM | POA: Insufficient documentation

## 2023-11-25 DIAGNOSIS — R300 Dysuria: Secondary | ICD-10-CM | POA: Diagnosis present

## 2023-11-25 DIAGNOSIS — Z7982 Long term (current) use of aspirin: Secondary | ICD-10-CM | POA: Diagnosis not present

## 2023-11-25 LAB — URINALYSIS, MICROSCOPIC (REFLEX)
Squamous Epithelial / HPF: NONE SEEN /HPF (ref 0–5)
WBC, UA: >50 WBC/hpf (ref 0–5)

## 2023-11-25 LAB — URINALYSIS, ROUTINE W REFLEX MICROSCOPIC

## 2023-11-25 LAB — CBC
HCT: 38.5 % — ABNORMAL LOW (ref 39.0–52.0)
Hemoglobin: 11.6 g/dL — ABNORMAL LOW (ref 13.0–17.0)
MCH: 26.4 pg (ref 26.0–34.0)
MCHC: 30.1 g/dL (ref 30.0–36.0)
MCV: 87.7 fL (ref 80.0–100.0)
Platelets: 255 K/uL (ref 150–400)
RBC: 4.39 MIL/uL (ref 4.22–5.81)
RDW: 19.7 % — ABNORMAL HIGH (ref 11.5–15.5)
WBC: 3.6 K/uL — ABNORMAL LOW (ref 4.0–10.5)
nRBC: 0 % (ref 0.0–0.2)

## 2023-11-25 LAB — BASIC METABOLIC PANEL WITH GFR
Anion gap: 11 (ref 5–15)
BUN: 36 mg/dL — ABNORMAL HIGH (ref 6–20)
CO2: 26 mmol/L (ref 22–32)
Calcium: 8.9 mg/dL (ref 8.9–10.3)
Chloride: 104 mmol/L (ref 98–111)
Creatinine, Ser: 1.97 mg/dL — ABNORMAL HIGH (ref 0.61–1.24)
GFR, Estimated: 39 mL/min — ABNORMAL LOW (ref >60)
Glucose, Bld: 83 mg/dL (ref 70–99)
Potassium: 3.9 mmol/L (ref 3.5–5.1)
Sodium: 141 mmol/L (ref 135–145)

## 2023-11-25 MED ORDER — SODIUM CHLORIDE 0.9 % IV SOLN
1.0000 g | Freq: Once | INTRAVENOUS | Status: AC
  Administered 2023-11-25: 1 g via INTRAVENOUS
  Filled 2023-11-25: qty 10

## 2023-11-25 MED ORDER — CIPROFLOXACIN HCL 500 MG PO TABS: 500.0000 mg | ORAL_TABLET | Freq: Every day | ORAL | 0 refills | Status: AC

## 2023-11-25 NOTE — ED Triage Notes (Signed)
 Dysuria and hematuria x 2 days , clots in urine , left kidney transplant 1 year ago .  Lower abd pain . No fever or chills .

## 2023-11-25 NOTE — Discharge Instructions (Addendum)
 Based on your symptoms and your urine, I think that you likely have an infection in your urine.  I have sent you a prescription for ciprofloxacin , which is an antibiotic.  Please take 500 mg each day.  You may take your first dose this evening when you pick it up.  Return to the emergency room if you develop fever, trouble passing urine, or pain.  Follow-up with your transplant team at your appointment on Monday.

## 2023-11-25 NOTE — ED Notes (Signed)
 Called [facility] per EDP request for consult with the Transfer team. Facesheet faxed and imaging powered shared.

## 2023-11-25 NOTE — ED Provider Notes (Signed)
 [facility] Provider Note   CSN: [id number] Arrival date & time: 11/25/23  9049     Patient presents with: Dysuria   Vernon Blackburn is a 58 y.o. male.   This is a 58 year old male with a history of kidney transplant, follows at [facility] here today for hematuria and dysuria.  Says symptoms started yesterday.  No fever, no chills.   Dysuria Presenting symptoms: dysuria        Prior to Admission medications   Medication Sig Start Date End Date Taking? Authorizing Provider  ciprofloxacin  (CIPRO ) 500 MG tablet Take 1 tablet (500 mg total) by mouth daily. 11/25/23  Yes Mannie Pac T, DO  acetaminophen  (TYLENOL ) 500 MG tablet Take 500 mg by mouth as needed.    [provider]  Albuterol  Sulfate (PROAIR  RESPICLICK) 108 (90 Base) MCG/ACT AEPB Inhale 2 puffs into the lungs every 6 (six) hours as needed (wheezing/shortness of breath).    [provider]  aspirin  EC 81 MG tablet Take 81 mg by mouth daily. Swallow whole.    [provider]  atorvastatin  (LIPITOR) 20 MG tablet Take 20 mg by mouth. 07/15/20   [provider]  BREO ELLIPTA  200-25 MCG/ACT AEPB Inhale 1 puff into the lungs. 07/24/21   [provider]  calcium  acetate (PHOSLO ) 667 MG capsule Take 2,668 mg by mouth 3 (three) times daily with meals. 11/22/19   [provider]  carvedilol  (COREG ) 6.25 MG tablet Take 1 tablet (6.25 mg total) by mouth 2 (two) times daily. 09/10/23   Clegg, Amy D, NP  docusate sodium  (COLACE) 50 MG capsule Take 50 mg by mouth.    [provider]  famotidine  (PEPCID ) 20 MG tablet Take 20 mg by mouth 2 (two) times daily.    [provider]  fexofenadine (ALLEGRA) 180 MG tablet Take 180 mg by mouth. 07/15/20   [provider]  furosemide  (LASIX ) 80 MG tablet Take 80 mg by mouth 2 (two) times daily.    [provider]  lisdexamfetamine (VYVANSE ) 30 MG capsule Take 30 mg by  mouth as needed.    [provider]  lisdexamfetamine (VYVANSE ) 30 MG capsule Take 30 mg by mouth daily.    [provider]  Melatonin 10 MG TABS Take 10 mg by mouth at bedtime.    [provider]  midodrine  (PROAMATINE ) 5 MG tablet Take 5 mg by mouth as needed. For blood pressure less than 120 systolic    [provider]  mycophenolate  (CELLCEPT) 250 MG capsule Take 750 mg by mouth 2 (two) times daily. 03/28/23 03/27/24  [provider]  pantoprazole  (PROTONIX ) 20 MG tablet Take 20 mg by mouth daily.    [provider]  polyethylene glycol (MIRALAX  / GLYCOLAX ) 17 g packet Take 17 g by mouth as needed.    [provider]  potassium chloride  SA (KLOR-CON  M) 20 MEQ tablet Take 2 tablets (40 mEq total) by mouth daily. 05/15/23   Colletta Manuelita Garre, PA-C  predniSONE  (DELTASONE ) 5 MG tablet Take 5 mg by mouth daily.    [provider]  spironolactone  (ALDACTONE ) 25 MG tablet Take 0.5 tablets (12.5 mg total) by mouth daily. 10/04/23   Rolan Ezra RAMAN, MD    Allergies: Isosorbide     Review of Systems  Genitourinary:  Positive for dysuria.    Updated Vital Signs BP (!) 129/92   Pulse 92   Temp 98.5 F (36.9 C)   Resp  16   Wt 77.1 kg   SpO2 100%   BMI 25.10 kg/m   Physical Exam Vitals and nursing note reviewed.  HENT:     Nose: Nose normal.  Eyes:     Pupils: Pupils are equal, round, and reactive to light.  Cardiovascular:     Rate and Rhythm: Normal rate.  Abdominal:     General: Abdomen is flat. There is no distension.     Palpations: Abdomen is soft.     Tenderness: There is no abdominal tenderness.  Skin:    General: Skin is warm.  Neurological:     General: No focal deficit present.     Mental Status: He is alert.     (all labs ordered are listed, but only abnormal results are displayed) Labs Reviewed  URINALYSIS, ROUTINE W REFLEX MICROSCOPIC - Abnormal; Notable for the following components:       Result Value   Color, Urine AMBER (*)    APPearance TURBID (*)    Glucose, UA   (*)    Value: TEST NOT REPORTED DUE TO COLOR INTERFERENCE OF URINE PIGMENT   Hgb urine dipstick   (*)    Value: TEST NOT REPORTED DUE TO COLOR INTERFERENCE OF URINE PIGMENT   Bilirubin Urine   (*)    Value: TEST NOT REPORTED DUE TO COLOR INTERFERENCE OF URINE PIGMENT   Ketones, ur   (*)    Value: TEST NOT REPORTED DUE TO COLOR INTERFERENCE OF URINE PIGMENT   Protein, ur   (*)    Value: TEST NOT REPORTED DUE TO COLOR INTERFERENCE OF URINE PIGMENT   Nitrite   (*)    Value: TEST NOT REPORTED DUE TO COLOR INTERFERENCE OF URINE PIGMENT   Leukocytes,Ua   (*)    Value: TEST NOT REPORTED DUE TO COLOR INTERFERENCE OF URINE PIGMENT   All other components within normal limits  BASIC METABOLIC PANEL WITH GFR - Abnormal; Notable for the following components:   BUN 36 (*)    Creatinine, Ser 1.97 (*)    GFR, Estimated 39 (*)    All other components within normal limits  CBC - Abnormal; Notable for the following components:   WBC 3.6 (*)    Hemoglobin 11.6 (*)    HCT 38.5 (*)    RDW 19.7 (*)    All other components within normal limits  URINALYSIS, MICROSCOPIC (REFLEX) - Abnormal; Notable for the following components:   Bacteria, UA MANY (*)    All other components within normal limits    EKG: None  Radiology: CT ABDOMEN PELVIS WO CONTRAST Result Date: 11/25/2023 CLINICAL DATA:  Abdominal/flank pain. EXAM: CT ABDOMEN AND PELVIS WITHOUT CONTRAST TECHNIQUE: Multidetector CT imaging of the abdomen and pelvis was performed following the standard protocol without IV contrast. RADIATION DOSE REDUCTION: This exam was performed according to the departmental dose-optimization program which includes automated exposure control, adjustment of the mA and/or kV according to patient size and/or use of iterative reconstruction technique. COMPARISON:  Pelvic CT dated 07/13/2020. FINDINGS: Evaluation of this exam is limited in the  absence of intravenous contrast. Lower chest: The visualized lung bases are clear. No intra-abdominal free air or free fluid. Hepatobiliary: Small cyst in the right lobe of the liver. The liver demonstrates a slightly lobulated contour suspicious for early changes of cirrhosis. No biliary dilatation. Layering stones within the gallbladder. No pericholecystic fluid or evidence of acute cholecystitis by CT. Pancreas: Unremarkable. No pancreatic ductal dilatation or surrounding inflammatory changes. Spleen: Normal  in size without focal abnormality. Adrenals/Urinary Tract: The native kidneys are atrophic. There is severe left hydronephrosis. Several nonobstructing bilateral renal calculi measure up to 6 mm in the inferior pole of the right kidney. There is a 9 mm linear calcification along the midportion of the left ureter along the anterior surface of the left psoas muscle (44/2). There is a left lower quadrant renal transplant. The transplant kidney appears edematous. There is moderate hydronephrosis of the transplant kidney. A pigtail ureteral stent extends from the superior pole of the transplant kidney into the urinary bladder. There is a 2 cm cyst in the upper pole of the transplant kidney. No peritransplant fluid collection. No stone within the transplant kidney. The urinary bladder is minimally distended. There is apparent diffuse thickening of the bladder wall which may be partly related to underdistention. Cystitis is not excluded. Correlation with urinalysis recommended. Stomach/Bowel: Postsurgical changes of gastric bypass. There is moderate stool throughout the colon. There is no bowel obstruction or active inflammation. The appendix is normal. Vascular/Lymphatic: Mild aortoiliac atherosclerotic disease. The IVC is unremarkable. No portal venous gas. There is no adenopathy. Reproductive: The prostate and seminal vesicles are grossly unremarkable. Other: None Musculoskeletal: Osteopenia with degenerative  changes. No acute osseous pathology. IMPRESSION: 1. Moderate hydronephrosis of edematous appearing left lower quadrant renal transplant. Findings may represent transplant failure. Nephrology consult is advised. A pigtail ureteral stent extends from the superior pole of the transplant kidney into the urinary bladder. Correlation with urinalysis recommended to evaluate for UTI. 2. Atrophic native kidneys with severe left hydronephrosis. 3. Nonobstructing bilateral renal calculi. 4. Cholelithiasis. 5. Postsurgical changes of gastric bypass. No bowel obstruction. Normal appendix. 6.  Aortic Atherosclerosis (ICD10-I70.0). Electronically Signed   By: Vanetta Chou M.D.   On: 11/25/2023 11:26     Procedures   Medications Ordered in the ED  cefTRIAXone  (ROCEPHIN ) 1 g in sodium chloride  0.9 % 100 mL IVPB (1 g Intravenous New Bag/Given 11/25/23 1122)                                    Medical Decision Making 58 year old male here today with dysuria and hematuria.  Plan-medically complex patient given his history of transplant.  Symptoms did sound like UTI, however he has a donor kidney to his native ureter which connects to his bladder.  There have been issues with stones in the native kidney, has had a stent placed.  His urine was turbid, there were bacteria.  Given his symptoms believe it is prudent to treat with antibiotics.  CT imaging showed concern for edematous kidney.  I reached out to the [facility] transplant team and spoke with Dr. Alto who was a great help.  He reviewed the patient's chart, called me back with additional recommendations.  Patient renal function is at baseline, CT imaging and results reviewed with Dr. Alto.  He recommended discharge with ciprofloxacin  500 mg daily.  Patient has a follow-up appointment with his transplant team on Monday.  Patient will return to the emergency room if he develops fever or chills.  I reviewed the patient's most recent nephrology office  notes.  Amount and/or Complexity of Data Reviewed Labs: ordered. Radiology: ordered.        Final diagnoses:  Urinary tract infection with hematuria, site unspecified    ED Discharge Orders          Ordered    ciprofloxacin  (CIPRO ) 500 MG  tablet  Daily        11/25/23 1222               Mannie Pac T, DO 11/25/23 1224

## 2023-11-26 NOTE — Progress Notes (Signed)
 Case request placed for left stent exchange 12/17/23

## 2023-11-28 ENCOUNTER — Emergency Department (HOSPITAL_BASED_OUTPATIENT_CLINIC_OR_DEPARTMENT_OTHER)
Admission: EM | Admit: 2023-11-28 | Discharge: 2023-11-28 | Disposition: A | Discharge status: Home / Self Care | Attending: Emergency Medicine | Admitting: Emergency Medicine

## 2023-11-28 ENCOUNTER — Encounter (HOSPITAL_BASED_OUTPATIENT_CLINIC_OR_DEPARTMENT_OTHER): Payer: Self-pay | Admitting: Emergency Medicine

## 2023-11-28 ENCOUNTER — Other Ambulatory Visit: Payer: Self-pay

## 2023-11-28 DIAGNOSIS — N186 End stage renal disease: Secondary | ICD-10-CM | POA: Diagnosis not present

## 2023-11-28 DIAGNOSIS — Z992 Dependence on renal dialysis: Secondary | ICD-10-CM | POA: Diagnosis not present

## 2023-11-28 DIAGNOSIS — R319 Hematuria, unspecified: Secondary | ICD-10-CM | POA: Diagnosis present

## 2023-11-28 DIAGNOSIS — Z7982 Long term (current) use of aspirin: Secondary | ICD-10-CM | POA: Diagnosis not present

## 2023-11-28 DIAGNOSIS — J45909 Unspecified asthma, uncomplicated: Secondary | ICD-10-CM | POA: Insufficient documentation

## 2023-11-28 DIAGNOSIS — Z79899 Other long term (current) drug therapy: Secondary | ICD-10-CM | POA: Diagnosis not present

## 2023-11-28 DIAGNOSIS — I132 Hypertensive heart and chronic kidney disease with heart failure and with stage 5 chronic kidney disease, or end stage renal disease: Secondary | ICD-10-CM | POA: Diagnosis not present

## 2023-11-28 DIAGNOSIS — R3129 Other microscopic hematuria: Secondary | ICD-10-CM | POA: Diagnosis not present

## 2023-11-28 DIAGNOSIS — I5022 Chronic systolic (congestive) heart failure: Secondary | ICD-10-CM | POA: Insufficient documentation

## 2023-11-28 NOTE — ED Triage Notes (Signed)
 Pt state 1 year out kidney transplant, seen for UTI Sunday given ABX, still passing blood clots in urine. With abdominal pain.

## 2023-11-28 NOTE — ED Provider Notes (Signed)
 [place] EM[facility] AT [facility] Provider Note  CSN: [id number] Arrival date & time: 11/28/23 9479  Chief Complaint(s) Hematuria  HPI Vernon Blackburn is a 58 y.o. male here with persistent hematuria with increasing clot size.  He is endorsing discomfort with voiding.  Was seen here on the 23rd of this month and prescribed ciprofloxacin  for UTI.  At that time transplant team at [facility] was consulted who recommended the antibiotic.  He was supposed to follow-up with them on Monday but states he had a conversation with a urologist who reviewed the chart and findings and agreed with continuing ciprofloxacin .  He is scheduled to have his ureteral stents removed on December 15.   Hematuria    Past Medical History Past Medical History:  Diagnosis Date   ADHD    Anemia, chronic renal failure    Asthma    followed by pcp   Benign localized prostatic hyperplasia with lower urinary tract symptoms (LUTS)    Bladder stones    BPH (benign prostatic hyperplasia)    Chronic gout    05-06-2020  per pt last episode 6 months ago,  (involves right knee and right great toe)   Chronic systolic HF (heart failure) (HCC)    followed by cardiology, dr rolan @ [facility],   last TEE 03-06-2019 in epic ef 45%   Dyspnea    ESRD on hemodialysis [facility])    primary nephrologist--- dr prescilla--- HD on M/W/ F  at [facility] in [place]   Heart murmur    History of bladder stone    History of cellulitis 01/27/2020   ED visit ,  abdominal wall   History of CVA (cerebrovascular accident) without residual deficits    remote infarct per imaging MRI brain in care every where 02-07-2019   History of kidney stones    History of obstructive sleep apnea    05-06-2020  per pt test positive for osa prior to gastric bypass in 2014 used cpap,  pt stated was retested after alot of weight loss , test was negative    Hypertension    followed by cardiology   Kidney  transplant recipient    left kidney   NICM (nonischemic cardiomyopathy) (HCC)    followed by cardiology---- 01/ 2018  ef 20-35%, severe MV stenosis;  last TEE echo in epic 03-06-2019  ef 45%   Peripheral edema    Bilateral lower extremity    S/P minimally invasive mitral valve repair followed by cardiology   06-29-2016  for severe stenosis ;  30 mm [name] via right mini thoracotomy approach;  last TEE echo in epic 03-06-2019 ef 45%, mild LVH, post op mild MR with mean gradient and no significant stenosis   Seasonal and perennial allergic rhinitis    Sleep apnea    Patient Active Problem List   Diagnosis Date Noted   Unilateral primary osteoarthritis, right knee 08/31/2023   Colon cancer screening    Diverticulosis of colon without hemorrhage    Grade II internal hemorrhoids    Lipoma of colon    Essential hypertension 07/13/2020   Asthma 07/13/2020   Ascites 07/12/2020   BPH with obstruction/lower urinary tract symptoms 05/25/2020   Abdominal wall cellulitis 01/27/2020   ESRD on dialysis [facility]) 04/22/2019   Long term (current) use of anticoagulants [Z79.01] 07/17/2016   Syncope and collapse 07/14/2016   Chest pain 07/13/2016   Other chest pain  Coagulopathy    S/P minimally invasive mitral valve repair 06/29/2016   CHF (congestive heart failure) (HCC) 06/06/2016   Hypertensive heart and chronic kidney disease with heart failure and stage 1 through stage 4 chronic kidney disease, or chronic kidney disease (HCC) 02/27/2016   Chronic systolic HF (heart failure) (HCC)    Cardiomyopathy (HCC)    Pulmonary HTN (HCC)    DOE (dyspnea on exertion) 01/02/2016   Chronic kidney disease (CKD), stage IV (severe) (HCC)    Anemia    Edema 11/15/2011   Home Medication(s) Prior to Admission medications   Medication Sig Start Date End Date Taking? Authorizing Provider  acetaminophen  (TYLENOL ) 500 MG tablet Take 500 mg by mouth as needed.    [provider]  Albuterol  Sulfate (PROAIR  RESPICLICK) 108 (90 Base) MCG/ACT AEPB Inhale 2 puffs into the lungs every 6 (six) hours as needed (wheezing/shortness of breath).    [provider]  aspirin  EC 81 MG tablet Take 81 mg by mouth daily. Swallow whole.    [provider]  atorvastatin  (LIPITOR) 20 MG tablet Take 20 mg by mouth. 07/15/20   [provider]  BREO ELLIPTA  200-25 MCG/ACT AEPB Inhale 1 puff into the lungs. 07/24/21   [provider]  calcium  acetate (PHOSLO ) 667 MG capsule Take 2,668 mg by mouth 3 (three) times daily with meals. 11/22/19   [provider]  carvedilol  (COREG ) 6.25 MG tablet Take 1 tablet (6.25 mg total) by mouth 2 (two) times daily. 09/10/23   Clegg, Amy D, NP  ciprofloxacin  (CIPRO ) 500 MG tablet Take 1 tablet (500 mg total) by mouth daily. 11/25/23   Mannie Fairy DASEN, DO  docusate sodium  (COLACE) 50 MG capsule Take 50 mg by mouth.    [provider]  famotidine  (PEPCID ) 20 MG tablet Take 20 mg by mouth 2 (two) times daily.    [provider]  fexofenadine (ALLEGRA) 180 MG tablet Take 180 mg by mouth. 07/15/20   [provider]  furosemide  (LASIX ) 80 MG tablet Take 80 mg by mouth 2 (two) times daily.    [provider]  lisdexamfetamine (VYVANSE ) 30 MG capsule Take 30 mg by mouth as needed.    [provider]  lisdexamfetamine (VYVANSE ) 30 MG capsule Take 30 mg by mouth daily.    [provider]  Melatonin 10 MG TABS Take 10 mg by mouth at bedtime.    [provider]  midodrine  (PROAMATINE ) 5 MG tablet Take 5 mg by mouth as needed. For blood pressure less than 120 systolic    [provider]  mycophenolate  (CELLCEPT) 250 MG capsule Take 750 mg by mouth 2 (two) times daily. 03/28/23 03/27/24  [provider]  pantoprazole  (PROTONIX ) 20 MG tablet Take 20 mg by mouth daily.    [provider]  polyethylene glycol (MIRALAX  / GLYCOLAX ) 17 g  packet Take 17 g by mouth as needed.    [provider]  potassium chloride  SA (KLOR-CON  M) 20 MEQ tablet Take 2 tablets (40 mEq total) by mouth daily. 05/15/23   Colletta Manuelita Garre, PA-C  predniSONE  (DELTASONE ) 5 MG tablet Take 5 mg by mouth daily.    [provider]  spironolactone  (ALDACTONE ) 25 MG tablet Take 0.5 tablets (12.5 mg total) by mouth daily. 10/04/23   Rolan Ezra RAMAN, MD  Allergies Isosorbide   Review of Systems Review of Systems  Genitourinary:  Positive for hematuria.   As noted in HPI  Physical Exam Vital Signs  I have reviewed the triage vital signs BP (!) 141/103 (BP Location: Right Arm)   Pulse 85   Temp 98.4 F (36.9 C) (Oral)   Resp 18   Ht 5' 9 (1.753 m)   Wt 77.1 kg   SpO2 100%   BMI 25.10 kg/m   Physical Exam Vitals reviewed.  Constitutional:      General: He is not in acute distress.    Appearance: He is well-developed. He is not diaphoretic.  HENT:     Head: Normocephalic and atraumatic.     Right Ear: External ear normal.     Left Ear: External ear normal.     Nose: Nose normal.     Mouth/Throat:     Mouth: Mucous membranes are moist.  Eyes:     General: No scleral icterus.    Conjunctiva/sclera: Conjunctivae normal.  Neck:     Trachea: Phonation normal.  Cardiovascular:     Rate and Rhythm: Normal rate and regular rhythm.  Pulmonary:     Effort: Pulmonary effort is normal. No respiratory distress.     Breath sounds: No stridor.  Abdominal:     General: There is no distension.     Tenderness: There is no abdominal tenderness. There is no guarding.  Musculoskeletal:        General: Normal range of motion.     Cervical back: Normal range of motion.  Neurological:     Mental Status: He is alert and oriented to person, place, and time.  Psychiatric:        Behavior: Behavior normal.      ED Results and Treatments Labs (all labs ordered are listed, but only abnormal results are displayed) Labs Reviewed - No data to display                                                                                                                       EKG  EKG Interpretation Date/Time:    Ventricular Rate:    PR Interval:    QRS Duration:    QT Interval:    QTC Calculation:   R Axis:      Text Interpretation:         Radiology No results found.  Medications Ordered in ED Medications - No data to display Procedures Procedures  (including critical care time) Medical Decision Making / ED Course   Medical Decision Making    Clinical Course as of 11/28/23 0623  Wed Nov 28, 2023  9447 Hematuria. Patient is concerned for increasing size of clots. Urine here is yellow without obvious signs of gross hematuria or clots. Postvoid residual was 17cc  Given the reassuring exam, with shared decision making we deferred repeat labs and urinalysis. [PC]    Clinical Course User Index [PC] Christyne Mccain, Raynell Moder, MD    Final Clinical  Impression(s) / ED Diagnoses Final diagnoses:  Other microscopic hematuria   The patient appears reasonably screened and/or stabilized for discharge and I doubt any other medical condition or other [facility] requiring further screening, evaluation, or treatment in the ED at this time. I have discussed the findings, Dx and Tx plan with the patient/family who expressed understanding and agree(s) with the plan. Discharge instructions discussed at length. The patient/family was given strict return precautions who verbalized understanding of the instructions. No further questions at time of discharge.  Disposition: Discharge  Condition: Good  ED Discharge Orders     None        Follow Up: Urology  Go to  as scheduled    This chart was dictated using voice recognition software.  Despite best efforts to proofread,  errors can occur which  can change the documentation meaning.    Trine Raynell Moder, MD 11/28/23 [phone]

## 2023-12-04 ENCOUNTER — Other Ambulatory Visit: Payer: Self-pay

## 2023-12-04 ENCOUNTER — Emergency Department (HOSPITAL_BASED_OUTPATIENT_CLINIC_OR_DEPARTMENT_OTHER)
Admission: EM | Admit: 2023-12-04 | Discharge: 2023-12-04 | Discharge status: Against Medical Advice | Attending: Emergency Medicine | Admitting: Emergency Medicine

## 2023-12-04 ENCOUNTER — Encounter (HOSPITAL_BASED_OUTPATIENT_CLINIC_OR_DEPARTMENT_OTHER): Payer: Self-pay

## 2023-12-04 DIAGNOSIS — Z5321 Procedure and treatment not carried out due to patient leaving prior to being seen by health care provider: Secondary | ICD-10-CM | POA: Insufficient documentation

## 2023-12-04 DIAGNOSIS — R300 Dysuria: Secondary | ICD-10-CM | POA: Insufficient documentation

## 2023-12-04 LAB — URINALYSIS, W/ REFLEX TO CULTURE (INFECTION SUSPECTED)
Bilirubin Urine: NEGATIVE
Glucose, UA: NEGATIVE mg/dL
Ketones, ur: NEGATIVE mg/dL
Nitrite: POSITIVE — AB
Protein, ur: >=300 mg/dL — AB
RBC / HPF: >50 RBC/hpf (ref 0–5)
Specific Gravity, Urine: 1.02 (ref 1.005–1.030)
WBC, UA: >50 WBC/hpf (ref 0–5)
pH: 6 (ref 5.0–8.0)

## 2023-12-04 NOTE — ED Triage Notes (Signed)
 Pt seen on the 26th and started treatment for UTI. Pt has been taking Septra since 11/26/ and continues to have urinary burning. Hx of kidney transplant 1 year ago . Reports blood clots in urine

## 2023-12-06 LAB — URINE CULTURE: Culture: 70000 — AB

## 2023-12-06 NOTE — Progress Notes (Signed)
 Prior to Admission Medication History             Vernon Blackburn's medication history has been completed by a Continuity of Care pharmacy technician.  Resources Utilized to [employer] Medication Information: patient interview, review of outpatient pharmacy refill records, and call to home pharmacy    Updates made to home medication list: Updated lisdexamfetamine from 30 mg to 60 mg, famotidine  from bid to daily,and furosemide  from daily to bid Added ferrous sulfate  Patient reports carvedilol  was recently decreased by PCP Last belatacept  given 11/25/23 Completed ciprofloxacin  (LF 11/25/23) and bactrim (LF 10/12/23)     Please review the updated home medication list below and contact the floor specific pharmacist with any further questions. This prior to admission medication information was obtained to the best of our abilities; however, its accuracy assumes patient reliability at the time of the interview and clinical discretion should be used to determine the appropriateness of continuing or discontinuing any medications on admission and discharge.   SARA E GROVE, PharmD  Medications Prior to Admission  Medication Sig Dispense Refill  . aspirin  81 MG EC tablet Take 1 tablet (81 mg total) by mouth once daily 120 tablet 0  . atorvastatin  (LIPITOR) 20 MG tablet Take 1 tablet (20 mg total) by mouth at bedtime 90 tablet 3  . blood glucose diagnostic (ACCU-CHEK GUIDE TEST STRIPS) test strip Use 1 each (1 strip total) once daily as instructed. 50 each 2  . carvediloL  (COREG ) 6.25 MG tablet Take 1 tablet by mouth once daily    . famotidine  (PEPCID ) 20 MG tablet Take 1 tablet (20 mg total) by mouth 2 (two) times daily (Patient taking differently: Take 20 mg by mouth once daily) 180 tablet 3  . ferrous sulfate  325 (65 FE) MG EC tablet Take 1 tablet by mouth once daily    . fexofenadine (ALLEGRA) 180 MG tablet Take 180 mg by mouth once daily    . fluticasone  furoate-vilanteroL (BREO  ELLIPTA) 200-25 mcg/dose DsDv Inhale 1 Puff into the lungs once daily 60 each 1  . folic acid  (FOLVITE ) 1 MG tablet Take 1 tablet (1 mg total) by mouth once daily 30 tablet 2  . FUROsemide  (LASIX ) 80 MG tablet Take 1 tablet (80 mg total) by mouth once daily (Patient taking differently: Take 1 tablet by mouth 2 (two) times daily) 30 tablet 11  . lactulose (ENULOSE) 10 gram/15 mL oral solution Take 15 mLs by mouth every other day for 189 days 473 mL 2  . lancets (ACCU-CHEK SOFTCLIX LANCETS) Use 1 each once daily Use as instructed. 100 each 2  . lisdexamfetamine (VYVANSE ) 60 MG capsule Take 1 capsule by mouth once daily    . multivitamin tablet Take 1 tablet by mouth once daily    . mycophenolate  (CELLCEPT) 250 mg capsule Take 3 capsules (750 mg total) by mouth every 12 (twelve) hours 180 capsule 11  . pantoprazole  (PROTONIX ) 20 MG DR tablet Take 1 tablet (20 mg total) by mouth once daily 30 tablet 11  . polyethylene glycol (MIRALAX ) powder Take 17 g by mouth once daily as needed for Constipation Mix in 4-8ounces of fluid prior to taking.    . potassium chloride  (KLOR-CON  M20) 20 MEQ ER tablet Take 1 tablet (20 mEq total) by mouth once daily 90 tablet 0  . predniSONE  (DELTASONE ) 5 MG tablet Take 1 tablet (5 mg total) by mouth once daily 30  tablet 11  . rosuvastatin (CRESTOR) 20 MG tablet Take 20 mg by mouth every evening    . spironolactone  (ALDACTONE ) 25 MG tablet Take 12.5 mg by mouth once daily    . [DISCONTINUED] albuterol  MDI, PROVENTIL , VENTOLIN , PROAIR , HFA (VENTOLIN  HFA) 90 mcg/actuation inhaler Inhale 2 inhalations into the lungs every 4 (four) hours as needed for Wheezing or Shortness of Breath 18 g 3  . acetaminophen  (TYLENOL ) 325 MG tablet Take 650 mg by mouth every 4 (four) hours as needed for Pain    . albuterol  MDI, PROVENTIL , VENTOLIN , PROAIR , HFA (VENTOLIN  HFA) 90 mcg/actuation inhaler Inhale 2 inhalations into the lungs every 4 (four) hours as needed for Wheezing or Shortness of Breath  18 g 3  . sennosides-docusate (SENOKOT-S) 8.6-50 mg tablet Take 2 tablets by mouth 2 (two) times daily (Patient taking differently: Take 1 tablet by mouth 2 (two) times daily) 120 tablet 1  . sodium chloride  0.9% SolP 100 mL with belatacept  250 mg SolR 475 mg IVPB Inject 475 mg into the vein every 28 (twenty-eight) days  Refrigerate. Protect from light. Administer via 1.2 micron filter extension set.    . [DISCONTINUED] famotidine  (PEPCID ) 20 MG tablet Take 1 tablet (20 mg total) by mouth 2 (two) times daily 60 tablet 6

## 2023-12-07 ENCOUNTER — Telehealth (HOSPITAL_BASED_OUTPATIENT_CLINIC_OR_DEPARTMENT_OTHER): Payer: Self-pay | Admitting: *Deleted

## 2023-12-07 NOTE — Telephone Encounter (Signed)
 Post ED Visit - Positive Culture Follow-up  Culture report reviewed by antimicrobial stewardship pharmacist: [facility] []  Rankin Dee, Pharm.D. []  Venetia Gully, Pharm.D., BCPS AQ-ID []  Garrel Crews, Pharm.D., BCPS []  Almarie Lunger, Pharm.D., BCPS []  [place], [address].D., BCPS, AAHIVP []  Rosaline Bihari, Pharm.D., BCPS, AAHIVP []  Vernell Meier, PharmD, BCPS []  Latanya Hint, PharmD, BCPS []  Donald Medley, PharmD, BCPS []  Rocky Bold, PharmD []  Dorothyann Alert, PharmD, BCPS [x]  Powell Blush, PharmD  [facility] []  Rosaline Edison, PharmD []  Romona Bliss, PharmD []  Dolphus Roller, PharmD []  Veva Seip, Rph []  Vernell Daunt) Leonce, PharmD []  Eva Allis, PharmD []  Rosaline Millet, PharmD []  Iantha Batch, PharmD []  Arvin Gauss, PharmD []  Wanda Hasting, PharmD []  Ronal Rav, PharmD []  Rocky Slade, PharmD []  Bard Jeans, PharmD   Positive urine culture Pt admitted to [facility] 12/04/23, ID saw and did not think UTI. UCX there grew mixed flora ureterolithiasis. No follow up.  Albino Alan Novak 12/07/2023, 10:54 AM

## 2023-12-16 ENCOUNTER — Emergency Department (HOSPITAL_BASED_OUTPATIENT_CLINIC_OR_DEPARTMENT_OTHER)
Admission: EM | Admit: 2023-12-16 | Discharge: 2023-12-16 | Disposition: A | Discharge status: Home / Self Care | Attending: Emergency Medicine | Admitting: Emergency Medicine

## 2023-12-16 ENCOUNTER — Encounter (HOSPITAL_BASED_OUTPATIENT_CLINIC_OR_DEPARTMENT_OTHER): Payer: Self-pay

## 2023-12-16 DIAGNOSIS — Z960 Presence of urogenital implants: Secondary | ICD-10-CM

## 2023-12-16 DIAGNOSIS — R300 Dysuria: Secondary | ICD-10-CM

## 2023-12-16 LAB — URINALYSIS, W/ REFLEX TO CULTURE (INFECTION SUSPECTED)
Bilirubin Urine: NEGATIVE
Glucose, UA: NEGATIVE mg/dL
Ketones, ur: NEGATIVE mg/dL
Nitrite: POSITIVE — AB
Protein, ur: >=300 mg/dL — AB
RBC / HPF: >50 RBC/hpf (ref 0–5)
Specific Gravity, Urine: 1.025 (ref 1.005–1.030)
pH: 6.5 (ref 5.0–8.0)

## 2023-12-16 MED ORDER — OXYCODONE-ACETAMINOPHEN 5-325 MG PO TABS: 1.0000 | ORAL_TABLET | Freq: Four times a day (QID) | ORAL | 0 refills | Status: AC | PRN

## 2023-12-16 NOTE — ED Provider Notes (Signed)
 [facility]  Provider Note  CSN: [id number] Arrival date & time: 12/16/23 0003  History Chief Complaint  Patient presents with   Hematuria    Vernon Blackburn is a 58 y.o. male with history of renal transplant and several months with a ureteral stent in place reports pain from the stent, sharp, worse with urination. He has been followed by Urology at [facility] and is scheduled for stent replacement on 12/15. He is here for pain control in the meantime. Has not had relief with ditropan . Denies fevers. Last culture at [facility] a week or so ago was mixed flora.    Home Medications Prior to Admission medications  Medication Sig Start Date End Date Taking? Authorizing Provider  oxyCODONE -acetaminophen  (PERCOCET/ROXICET) 5-325 MG tablet Take 1 tablet by mouth every 6 (six) hours as needed for severe pain (pain score 7-10). 12/16/23  Yes Roselyn Carlin KATHEE, MD  acetaminophen  (TYLENOL ) 500 MG tablet Take 500 mg by mouth as needed.    [provider]  Albuterol  Sulfate (PROAIR  RESPICLICK) 108 (90 Base) MCG/ACT AEPB Inhale 2 puffs into the lungs every 6 (six) hours as needed (wheezing/shortness of breath).    [provider]  aspirin  EC 81 MG tablet Take 81 mg by mouth daily. Swallow whole.    [provider]  atorvastatin  (LIPITOR) 20 MG tablet Take 20 mg by mouth. 07/15/20   [provider]  BREO ELLIPTA  200-25 MCG/ACT AEPB Inhale 1 puff into the lungs. 07/24/21   [provider]  calcium  acetate (PHOSLO ) 667 MG capsule Take 2,668 mg by mouth 3 (three) times daily with meals. 11/22/19   [provider]  carvedilol  (COREG ) 6.25 MG tablet Take 1 tablet (6.25 mg total) by mouth 2 (two) times daily. 09/10/23   Clegg, Amy D, NP  ciprofloxacin  (CIPRO ) 500 MG tablet Take 1 tablet (500 mg total) by mouth daily. 11/25/23   Mannie Fairy DASEN, DO  docusate sodium  (COLACE) 50 MG capsule Take 50 mg by mouth.    [provider]  famotidine  (PEPCID ) 20 MG tablet Take 20 mg by mouth 2 (two) times daily.    [provider]  fexofenadine (ALLEGRA) 180 MG tablet Take 180 mg by mouth. 07/15/20   [provider]  furosemide  (LASIX ) 80 MG tablet Take 80 mg by mouth 2 (two) times daily.    [provider]  lisdexamfetamine  (VYVANSE ) 30 MG capsule Take 30 mg by mouth as needed.    [provider]  lisdexamfetamine  (VYVANSE ) 30 MG capsule Take 30 mg by mouth daily.    [provider]  Melatonin 10 MG TABS Take 10 mg by mouth at bedtime.    [provider]  midodrine  (PROAMATINE ) 5 MG tablet Take 5 mg by mouth as needed. For blood pressure less than 120 systolic    [provider]  mycophenolate  (CELLCEPT) 250 MG capsule Take 750 mg by mouth 2 (two) times daily. 03/28/23 03/27/24  [provider]  pantoprazole  (PROTONIX ) 20 MG tablet Take 20 mg by mouth daily.    [provider]  polyethylene glycol (MIRALAX  / GLYCOLAX ) 17 g packet Take 17 g by mouth as needed.    [provider]  potassium chloride  SA (KLOR-CON  M) 20 MEQ tablet Take 2 tablets (40 mEq total) by mouth daily. 05/15/23   Colletta Manuelita Garre, PA-C  predniSONE  (DELTASONE ) 5 MG tablet Take 5 mg by mouth daily.    [provider]  spironolactone  (ALDACTONE ) 25 MG tablet Take 0.5 tablets (12.5 mg total) by mouth daily. 10/04/23   Rolan Ezra RAMAN, MD     Allergies    Isosorbide    Review of Systems   Review of Systems Please see HPI for pertinent positives and negatives  Physical Exam BP (!) 138/99 (BP Location: Right Arm)   Pulse 88   Temp 98.8 F (37.1 C) (Oral)   Resp 18   Ht 5' 9 (1.753 m)   Wt 80.3 kg   SpO2 96%   BMI 26.14 kg/m   Physical Exam Vitals and nursing note reviewed.  HENT:     Head: Normocephalic.     Nose: Nose normal.  Eyes:     Extraocular Movements: Extraocular movements intact.  Pulmonary:     Effort: Pulmonary effort  is normal.  Musculoskeletal:        General: Normal range of motion.     Cervical back: Neck supple.  Skin:    Findings: No rash (on exposed skin).  Neurological:     Mental Status: He is alert and oriented to person, place, and time.  Psychiatric:        Mood and Affect: Mood normal.     ED Results / Procedures / Treatments   EKG None  Procedures Procedures  Medications Ordered in the ED Medications - No data to display  Initial Impression and Plan  Patient here for pain control from ureteral stent. No fevers or other signs of sepsis. UA with signs of infection/inflammation similar to previous, will defer treatment pending culture given history. Rx for oxycodone  as needed. Follow up with [facility] as scheduled tomorrow.   ED Course       MDM Rules/Calculators/A&P Medical Decision Making Problems Addressed: Dysuria: chronic illness or injury Ureteral stent present: chronic illness or injury  Amount and/or Complexity of Data Reviewed Labs: ordered. Decision-making details documented in ED Course.  Risk Prescription drug management.     Final Clinical Impression(s) / ED Diagnoses Final diagnoses:  Ureteral stent present  Dysuria    Rx / DC Orders ED Discharge Orders          Ordered    oxyCODONE -acetaminophen  (PERCOCET/ROXICET) 5-325 MG tablet  Every 6 hours PRN        12/16/23 0243             Roselyn Carlin NOVAK, MD 12/16/23 [phone]

## 2023-12-16 NOTE — ED Triage Notes (Signed)
 Patient reports stent in urethra and has been experiencing painful urination for three weeks, hematuria. Patient reports he will see [facility] tomorrow 12/17/23 for stent removal and replacement, but pain is unbearable.  Patient ambulates to triage with steady gait, NAD noted, VSS.

## 2023-12-16 NOTE — ED Notes (Signed)
 Discharge paperwork reviewed entirely with patient, including follow up care. Pain was under control. The patient received instruction and coaching on their prescriptions, and all follow-up questions were answered.  Pt verbalized understanding as well as all parties involved. No questions or concerns voiced at the time of discharge. No acute distress noted. Pt was encouraged to stay adequately hydrated and eat a healthy diet.   Pt ambulated out to [facility] without incident or assistance.  Pt advised they will notify their PCP immediately. and Pt advised they will seek followup care with a specialist and followup with their PCP.   The pt was instructed to set up and/or review MyChart for their results; and was informed their Providers all have access to the information as well.

## 2023-12-17 LAB — URINE CULTURE

## 2024-01-02 ENCOUNTER — Telehealth (HOSPITAL_COMMUNITY): Payer: Self-pay

## 2024-01-02 NOTE — Telephone Encounter (Addendum)
 Called to confirm/remind patient of [facility] on [facility].   Appointment:   [x] Confirmed  [] Left mess   [] No answer/No voice mail  [] VM Full/unable to leave message  [] Phone not in service  Patient reminded to bring all medications and/or complete list.  Confirmed patient has transportation. Gave directions, instructed to utilize valet parking.

## 2024-01-02 NOTE — Progress Notes (Signed)
 Heart Failure Clinic Note  PCP:  Keren Vicenta BRAVO, MD  Cardiologist:  Dr. Rolan  Chief complaint: CHF   History of Present Illness: Vernon Blackburn is a 58 y.o. male who has a history of long-standing, poorly-controlled HTN, CKD stage 3-4, chronic systolic CHF and severe mitral regurgitation.  He has had hypertension for years.  He has had known CKD, followed by [facility].  He developed severe dyspnea in 12/17 and was admitted with acute systolic CHF.  Echo showed EF 20-25% with severe MR.  He was diuresed and discharged.   TEE was done in 5/18 to assess mitral valve.  There was severe MR.  The MV was thickened and did not coapt well.  Suspect there was a component of secondary MR with moderate LV dilation, however the thickened leaflets suggested a component of primary MR as well.   He had right and left heart cath in 6/18.  No significant CAD, elevated filling pressures.    In 6/18, he had mitral valve repair.  Post-op echo in 7/18 showed EF 25-30% with stable MV repair (mild MR, mean gradient 4 mmHg).  Echo 12/18 with EF 30-35%, moderate LV dilation, s/p MVR repair with trivial MR.  Echo in 4/19 showed EF up to 40-45% with stable repaired mitral valve.   He had an echo in 8/20 with EF 35%, mild LV dilation, s/p MV repair with mean gradient 4 mmHg and trivial MR, mildly decreased RV systolic function.   He was admitted in 2/21 to [facility] with worsening renal function and HD was started.  Echo at [facility] in 2/21 showed EF 40-45%, mild LV dilation, moderate LVH, severe mitral stenosis with mean gradient 7 mmHg and eccentric mild MR.  Head imagining showed an old CVA.   TEE was done to followup on ?severe mitral stenosis in 3/21.  This showed EF 45%, moderate LVH, global mild hypokinesis, mildly decreased RV systolic function; s/p MV repair, mild MR with mean gradient 4 mmHg, no significant mitral stenosis.  CPX in 11/21 showed peak VO2 17.7 (51% predicted), RER 1.12, VE/VCO2 slope 29.  Moderate  functional limitation due to mixed restrictive/obstructive lung disease.  Echo in 7/22 showed EF 50-55%, s/p MV repair with trivial MR and mean gradient 5 mmHg.   Patient was admitted in 7/22 with abdominal distention and found to have ascites, cause still uncertain (unless simply due to inadequate dialysis prescription).  No evidence for cirrhosis by CT. He had paracentesis. He additionally had TURP for enlarged prostate and has ended up with a suprapubic catheter.    RHC in 10/22 to assess RV function in the setting of ascites of uncertain etiology showed normal right and left heart filling pressures, normal PA pressure, and normal cardiac output.  He has been seen by GI and continues to require paracenteses.  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   Echo in 1/24 showed EF 50%, mild LVH, mild global hypokinesis, mildly decreased RV systolic function with mild RV enlargement, s/p mitral valve repair with mild MR.  Cardiolite in 1/24 showed no ischemia/infarction.   He underwent living donor kidney transplant in 11/24. He was admitted to [facility] in 04/25 with neutropenic fever and septic shock 2/2 Klebsiella urosepsis. Had nephrostomy tub removed April 9th. Had slow passage of dye on antegrade nephrostogram through a stenotic kinked ureter.  He was seen in the HF clinic 05/07/23. Volume overloaded. ReDS 42%. Lasix  was increased  lasix  40 mg twice a day. Lasix  subsequently increased to 80 mg bid by [facility] renal transplant team.   Echo in 6/25 showed EF 50-55%, mild LVH, s/p MV repair with mean gradient 6 mmHg and trivial MR.   Today he returns for HF follow up.  He still gets periodic paracenteses, the last was in early 9/25.  He feels like the ascites fluid is taking longer to build up.  Weight stable. No dyspnea with usual activities.  Dyspnea with stairs, hills.  No chest pain.  No orthopnea/PND.  No lightheadedness.  Working part time as a clinical research associate.    ECG (personally reviewed): NSR, normal  Labs (9/25): K 3.1, creatinine 2.3  PMH: 1. Gout 2. HTN: Long-standing, poor control. 3. ESRD.  Likely hypertensive nephropathy.  - Sp living donor kidney transplant at [facility] 11/24 4. Dilated cardiomyopathy: May be due to long-standing HTN.  - Echo (1/18): EF 20-25%, severe MR - Echo (2/18): EF 25%, severe MR.  - TEE (5/18): Moderate LV dilation with severe global hypokinesis, EF 25-30%, normal RV size with mildly decreased systolic function, RV-RA gradient 55 mmHg, severe MR with mildly thickened leaflets with inadequate coaptation and ERO 0.52 cm^2 by PISA and systolic flow reversal in the pulmonary vein doppler pattern => secondary MR from dilated LV but thickened leaflets lead to concern for component of primary MR as well.  - RHC/LHC (6/18): No significant CAD.  Mean RA 15, PA 76/31 mean 48, mean PCWP 45, CI 3.02 Fick, 2.64 thermo.  - Echo (7/18): EF 25-30%, mild dilation, diffuse hypokinesis, s/p MV repair with mean gradient 4 mmHg and mild MR, PASP 37 mmHg, normal RV size and systolic function.  - Echo (12/18): EF 30-35%, moderate LV dilation with diffuse hypokinesis, stable MV repair.  - Echo (4/19): EF 40-45%, severe LV dilation, s/p mitral valve repair with mean gradient 4 mmHg, no mitral regurgitation, severe RV dilation with normal systolic function.  - Echo (8/20): EF 35%, mild LV dilation, s/p MV repair with mean gradient 4 mmHg and trivial MR, mildly decreased RV systolic function.  - Echo (2/21, [facility]):  EF 40-45%, mild LV dilation, moderate LVH, severe mitral stenosis with mean gradient 7 mmHg and eccentric mild MR. - TEE (3/21): EF 45%, moderate LVH, global mild hypokinesis, mildly decreased RV systolic function; s/p MV repair, mild MR with mean gradient 4 mmHg, no significant mitral stenosis.  - CPX (11/21): peak VO2 17.7 (51% predicted), RER 1.12, VE/VCO2 slope 29.  Moderate functional limitation due to mixed restrictive/obstructive  lung disease.  - Echo (7/22): EF 50-55%, s/p MV repair with trivial MR and mean gradient 5 mmHg.  - RHC (10/22): mean RA 1, PA 17/2, mean PCWP 2, CI 3.3 - Echo (1/24): EF 50%, mild LVH, mild global hypokinesis, mildly decreased RV systolic function with mild RV enlargement, s/p mitral valve repair with mild MR.  - Cardiolite (1/24): EF 45%, no ischemia/infarction.  - Echo at [facility] 11/24: EF 55%, RV okay, trivial MR - Echo (6/25): EF 50-55%, mild LVH, s/p MV repair with mean gradient 6 mmHg and trivial MR.  5. Severe MR: S/p MV repair in 6/18. 6. H/o CVA 7. BPH: S/p TURP, now with suprapubic catheter.  8. H/o ascites with paracenteses.  9. PVCs: Zio monitor in 6/25 showed 5.7% PVCs.   SH: Married, lives in [place], nonsmoker, no drugs, occasional ETOH.  Lawyer.   Family History  Problem Relation Age of Onset   Diabetes Mother    Hypertension Mother  Heart failure Mother    Colon cancer Neg Hx    Esophageal cancer Neg Hx    Rectal cancer Neg Hx    Stomach cancer Neg Hx    ROS: All systems reviewed and negative except as per HPI.   Current Outpatient Medications  Medication Sig Dispense Refill   acetaminophen  (TYLENOL ) 500 MG tablet Take 500 mg by mouth as needed.     Albuterol  Sulfate (PROAIR  RESPICLICK) 108 (90 Base) MCG/ACT AEPB Inhale 2 puffs into the lungs every 6 (six) hours as needed (wheezing/shortness of breath).     aspirin  EC 81 MG tablet Take 81 mg by mouth daily. Swallow whole.     atorvastatin  (LIPITOR) 20 MG tablet Take 20 mg by mouth.     BREO ELLIPTA  200-25 MCG/ACT AEPB Inhale 1 puff into the lungs.     calcium  acetate (PHOSLO ) 667 MG capsule Take 2,668 mg by mouth 3 (three) times daily with meals.     carvedilol  (COREG ) 6.25 MG tablet Take 1 tablet (6.25 mg total) by mouth 2 (two) times daily. 180 tablet 3   ciprofloxacin  (CIPRO ) 500 MG tablet Take 1 tablet (500 mg total) by mouth daily. 10 tablet 0   docusate sodium  (COLACE) 50 MG capsule Take 50 mg by mouth.      famotidine  (PEPCID ) 20 MG tablet Take 20 mg by mouth 2 (two) times daily.     fexofenadine (ALLEGRA) 180 MG tablet Take 180 mg by mouth.     furosemide  (LASIX ) 80 MG tablet Take 80 mg by mouth 2 (two) times daily.     lisdexamfetamine  (VYVANSE ) 30 MG capsule Take 30 mg by mouth as needed.     lisdexamfetamine  (VYVANSE ) 30 MG capsule Take 30 mg by mouth daily.     Melatonin 10 MG TABS Take 10 mg by mouth at bedtime.     midodrine  (PROAMATINE ) 5 MG tablet Take 5 mg by mouth as needed. For blood pressure less than 120 systolic     mycophenolate  (CELLCEPT) 250 MG capsule Take 750 mg by mouth 2 (two) times daily.     oxyCODONE -acetaminophen  (PERCOCET/ROXICET) 5-325 MG tablet Take 1 tablet by mouth every 6 (six) hours as needed for severe pain (pain score 7-10). 15 tablet 0   pantoprazole  (PROTONIX ) 20 MG tablet Take 20 mg by mouth daily.     polyethylene glycol (MIRALAX  / GLYCOLAX ) 17 g packet Take 17 g by mouth as needed.     potassium chloride  SA (KLOR-CON  M) 20 MEQ tablet Take 2 tablets (40 mEq total) by mouth daily.     predniSONE  (DELTASONE ) 5 MG tablet Take 5 mg by mouth daily.     spironolactone  (ALDACTONE ) 25 MG tablet Take 0.5 tablets (12.5 mg total) by mouth daily. 45 tablet 3   No current facility-administered medications for this visit.   Exam:   There were no vitals taken for this visit. Wt Readings from Last 3 Encounters:  12/16/23 80.3 kg (177 lb)  12/04/23 80.3 kg (177 lb)  11/28/23 77.1 kg (169 lb 15.6 oz)  General: NAD Neck: No JVD, no thyromegaly or thyroid  nodule.  Lungs: Crackles at bases.  CV: Nondisplaced PMI.  Heart regular S1/S2, no S3/S4, no murmur.  1+ ankle edema.  No carotid bruit.  Normal pedal pulses.  Abdomen: Soft, nontender, no hepatosplenomegaly, mild distention.  Skin: Intact without lesions or rashes.  Neurologic: Alert and oriented x 3.  Psych: Normal affect. Extremities: No clubbing or cyanosis.  HEENT: Normal.   Assessment/Plan:  1. Chronic  systolic CHF now with recovered EF: Echo in 7/18 with EF 25-30%. Nonischemic cardiomyopathy based on cath in 6/18.  Cause of cardiomyopathy may be long-standing, poorly controlled HTN.  NYHA class II symptoms.  He improved symptomatically s/p MV repair.  Echo in 4/19 with EF 40-45%. Echo at [facility] in 2/21 showed EF 40-45%.   TEE in 3/21 with EF 45%.  Echo in 5/22 with EF up to 50-55%.  Echo in 1/24 showed EF 50%, mild LVH, mild global hypokinesis, mildly decreased RV systolic function with mild RV enlargement, s/p mitral valve repair with mild MR. RHC in 10/22 was normal, hard to explain his ascites by RV failure.  Echo in 6/25 showed  EF 50-55%, mild LVH, s/p MV repair with mean gradient 6 mmHg and trivial MR.   Mild volume overload, suspect mainly due to ascites with mildly distended abdomen. NYHA class II.  - I will keep Lasix  at 80 mg bid but with tendency to develop ascites will add spironolactone  12.5 mg daily. Will need to watch creatinine closely with this, check BMET/BNP today and BMET in 10 days.  - Continue coreg  6.25 mg BID 2. HTN: Possible hypertensive cardiomyopathy and hypertensive nephropathy.  Stable.  - Continue coreg   3. ESRD: S/p living donor kidney transplant at [facility] in 11/24. - On Cellcept 750 mg BID and prednisone  5 mg daily (Bactrim and valcyte for prophylaxis) 4. Mitral regurgitation: s/p MV repair for severe MR.  Echo in 6/25 showed stable repaired mitral valve with no more than mild stenosis and trivial regurgitation.  - Will need abx with dental work given prosthetic material.  5. CVA: Prior CVA by head imaging.   6. Hyperlipidemia: On 20 mg atorvastatin  daily 7. Ascites: No cirrhosis on liver imaging and though prior RHC did not show significant RV dysfunction, CHF is probably the main etiology.  He has required therapeutic paracenteses periodically (last in early 9/25).  - Carefully add spironolactone  as above.  8. PVCs: Zio monitor in 6/25 with 5.7% PVCs.  - Continue Coreg ,  no BP room to increase.   Follow up 3 months with APP.   I spent 31 minutes reviewing records, interviewing/examining patient, and managing orders.   Signed, Harlene CHRISTELLA Gainer, FNP  01/02/2024  [facility] [address] [facility] [place] [address] [phone] (office) [phone] (fax)

## 2024-01-04 ENCOUNTER — Ambulatory Visit (HOSPITAL_COMMUNITY)
Admission: RE | Admit: 2024-01-04 | Discharge: 2024-01-04 | Disposition: A | Discharge status: Home / Self Care | Source: Ambulatory Visit | Attending: Family Medicine | Admitting: Family Medicine

## 2024-01-04 ENCOUNTER — Encounter (HOSPITAL_COMMUNITY): Payer: Self-pay

## 2024-01-04 VITALS — BP 128/88 | HR 88 | Ht 69.0 in | Wt 184.2 lb

## 2024-01-04 DIAGNOSIS — Z952 Presence of prosthetic heart valve: Secondary | ICD-10-CM | POA: Insufficient documentation

## 2024-01-04 DIAGNOSIS — Z79899 Other long term (current) drug therapy: Secondary | ICD-10-CM | POA: Diagnosis not present

## 2024-01-04 DIAGNOSIS — Z940 Kidney transplant status: Secondary | ICD-10-CM | POA: Insufficient documentation

## 2024-01-04 DIAGNOSIS — I10 Essential (primary) hypertension: Secondary | ICD-10-CM

## 2024-01-04 DIAGNOSIS — I5021 Acute systolic (congestive) heart failure: Secondary | ICD-10-CM | POA: Diagnosis not present

## 2024-01-04 DIAGNOSIS — N400 Enlarged prostate without lower urinary tract symptoms: Secondary | ICD-10-CM | POA: Diagnosis not present

## 2024-01-04 DIAGNOSIS — I428 Other cardiomyopathies: Secondary | ICD-10-CM | POA: Insufficient documentation

## 2024-01-04 DIAGNOSIS — I340 Nonrheumatic mitral (valve) insufficiency: Secondary | ICD-10-CM

## 2024-01-04 DIAGNOSIS — Z8673 Personal history of transient ischemic attack (TIA), and cerebral infarction without residual deficits: Secondary | ICD-10-CM | POA: Diagnosis not present

## 2024-01-04 DIAGNOSIS — I420 Dilated cardiomyopathy: Secondary | ICD-10-CM | POA: Diagnosis not present

## 2024-01-04 DIAGNOSIS — E785 Hyperlipidemia, unspecified: Secondary | ICD-10-CM | POA: Insufficient documentation

## 2024-01-04 DIAGNOSIS — I493 Ventricular premature depolarization: Secondary | ICD-10-CM | POA: Insufficient documentation

## 2024-01-04 DIAGNOSIS — I052 Rheumatic mitral stenosis with insufficiency: Secondary | ICD-10-CM | POA: Insufficient documentation

## 2024-01-04 DIAGNOSIS — Z7982 Long term (current) use of aspirin: Secondary | ICD-10-CM | POA: Insufficient documentation

## 2024-01-04 DIAGNOSIS — Z7951 Long term (current) use of inhaled steroids: Secondary | ICD-10-CM | POA: Insufficient documentation

## 2024-01-04 DIAGNOSIS — I5022 Chronic systolic (congestive) heart failure: Secondary | ICD-10-CM | POA: Diagnosis not present

## 2024-01-04 DIAGNOSIS — I132 Hypertensive heart and chronic kidney disease with heart failure and with stage 5 chronic kidney disease, or end stage renal disease: Secondary | ICD-10-CM | POA: Diagnosis present

## 2024-01-04 DIAGNOSIS — Z7952 Long term (current) use of systemic steroids: Secondary | ICD-10-CM | POA: Diagnosis not present

## 2024-01-04 DIAGNOSIS — R188 Other ascites: Secondary | ICD-10-CM | POA: Insufficient documentation

## 2024-01-04 DIAGNOSIS — E8770 Fluid overload, unspecified: Secondary | ICD-10-CM | POA: Diagnosis not present

## 2024-01-04 DIAGNOSIS — Z79624 Long term (current) use of inhibitors of nucleotide synthesis: Secondary | ICD-10-CM | POA: Insufficient documentation

## 2024-01-04 NOTE — Patient Instructions (Addendum)
 Good to see you today!  No medication changes were made today  Your physician recommends that you schedule a follow-up appointment 6 months with echocardiogram(July) Call office in May  to schedule appointment  Your physician has requested that you have an echocardiogram. Echocardiography is a painless test that uses sound waves to create images of your heart. It provides your doctor with information about the size and shape of your heart and how well your hearts chambers and valves are working. This procedure takes approximately one hour. There are no restrictions for this procedure. Please do NOT wear cologne, perfume, aftershave, or lotions (deodorant is allowed). Please arrive 15 minutes prior to your appointment time.  Please note: We ask at that you not bring children with you during ultrasound (echo/ vascular) testing. Due to room size and safety concerns, children are not allowed in the ultrasound rooms during exams. Our front office staff cannot provide observation of children in our lobby area while testing is being conducted. An adult accompanying a patient to their appointment will only be allowed in the ultrasound room at the discretion of the ultrasound technician under special circumstances. We apologize for any inconvenience.  If you have any questions or concerns before your next appointment please send us  a message through [facility] or call our office at [phone].    TO LEAVE A MESSAGE FOR THE NURSE SELECT OPTION 2, PLEASE LEAVE A MESSAGE INCLUDING: YOUR NAME DATE OF BIRTH CALL BACK NUMBER REASON FOR CALL**this is important as we prioritize the call backs  YOU WILL RECEIVE A CALL BACK THE SAME DAY AS LONG AS YOU CALL BEFORE 4:00 PM At the [facility], you and your health needs are our priority. As part of our continuing mission to provide you with exceptional heart care, we have created designated Provider Care Teams. These Care Teams include your primary  Cardiologist (physician) and Advanced Practice Providers (APPs- Physician Assistants and Nurse Practitioners) who all work together to provide you with the care you need, when you need it.   You may see any of the following providers on your designated Care Team at your next follow up: Dr Toribio Fuel Dr Ezra Shuck Dr. Morene Brownie Greig Mosses, NP Caffie Shed, [place] [facility], [place] Beckey Coe, NP Jordan Lee, NP Ellouise Class, NP Tinnie Redman, PharmD Jaun Bash, PharmD   Please be sure to bring in all your medications bottles to every appointment.    Thank you for choosing [facility]
